# Patient Record
Sex: Male | Born: 1950 | Race: White | Hispanic: No | Marital: Married | Smoking: Never smoker
Health system: Southern US, Community
[De-identification: ages and names within clinical notes are randomized; demographics above are authoritative.]

## PROBLEM LIST (undated history)

## (undated) DIAGNOSIS — M1990 Unspecified osteoarthritis, unspecified site: Secondary | ICD-10-CM

## (undated) DIAGNOSIS — I38 Endocarditis, valve unspecified: Secondary | ICD-10-CM

## (undated) DIAGNOSIS — J45909 Unspecified asthma, uncomplicated: Secondary | ICD-10-CM

## (undated) DIAGNOSIS — L409 Psoriasis, unspecified: Secondary | ICD-10-CM

## (undated) DIAGNOSIS — F32A Depression, unspecified: Secondary | ICD-10-CM

## (undated) DIAGNOSIS — I10 Essential (primary) hypertension: Secondary | ICD-10-CM

## (undated) DIAGNOSIS — I351 Nonrheumatic aortic (valve) insufficiency: Secondary | ICD-10-CM

## (undated) DIAGNOSIS — I5189 Other ill-defined heart diseases: Secondary | ICD-10-CM

## (undated) DIAGNOSIS — E785 Hyperlipidemia, unspecified: Principal | ICD-10-CM

## (undated) DIAGNOSIS — M519 Unspecified thoracic, thoracolumbar and lumbosacral intervertebral disc disorder: Secondary | ICD-10-CM

## (undated) DIAGNOSIS — I2510 Atherosclerotic heart disease of native coronary artery without angina pectoris: Secondary | ICD-10-CM

## (undated) DIAGNOSIS — M5090 Cervical disc disorder, unspecified, unspecified cervical region: Secondary | ICD-10-CM

## (undated) DIAGNOSIS — F329 Major depressive disorder, single episode, unspecified: Secondary | ICD-10-CM

## (undated) HISTORY — DX: Major depressive disorder, single episode, unspecified: F32.9

## (undated) HISTORY — DX: Nonrheumatic aortic (valve) insufficiency: I35.1

## (undated) HISTORY — DX: Hyperlipidemia, unspecified: E78.5

## (undated) HISTORY — PX: SINOSCOPY: SHX187

## (undated) HISTORY — PX: TONSILLECTOMY: SUR1361

## (undated) HISTORY — DX: Other ill-defined heart diseases: I51.89

## (undated) HISTORY — DX: Unspecified osteoarthritis, unspecified site: M19.90

## (undated) HISTORY — DX: Cervical disc disorder, unspecified, unspecified cervical region: M50.90

## (undated) HISTORY — DX: Depression, unspecified: F32.A

## (undated) HISTORY — DX: Unspecified asthma, uncomplicated: J45.909

## (undated) HISTORY — DX: Unspecified thoracic, thoracolumbar and lumbosacral intervertebral disc disorder: M51.9

## (undated) HISTORY — DX: Psoriasis, unspecified: L40.9

## (undated) HISTORY — DX: Essential (primary) hypertension: I10

## (undated) HISTORY — PX: PROSTATE SURGERY: SHX751

## (undated) HISTORY — PX: HERNIA REPAIR: SHX51

## (undated) HISTORY — DX: Endocarditis, valve unspecified: I38

---

## 1990-04-28 HISTORY — PX: PAROTIDECTOMY: SUR1003

## 2010-04-28 HISTORY — PX: CHOLECYSTECTOMY: SHX55

## 2011-04-29 HISTORY — PX: SINUS EXPLORATION: SHX5214

## 2011-04-29 HISTORY — PX: SPINE SURGERY: SHX786

## 2011-12-26 ENCOUNTER — Other Ambulatory Visit: Payer: Self-pay | Admitting: Neurosurgery

## 2011-12-26 ENCOUNTER — Other Ambulatory Visit: Payer: Self-pay | Admitting: Gastroenterology

## 2012-01-05 ENCOUNTER — Encounter: Payer: Self-pay | Admitting: Neurosurgery

## 2012-01-05 ENCOUNTER — Ambulatory Visit: Payer: Self-pay | Admitting: Neurosurgery

## 2012-01-05 VITALS — BP 104/70 | HR 81 | Ht 72.0 in | Wt 196.0 lb

## 2012-01-05 DIAGNOSIS — M5030 Other cervical disc degeneration, unspecified cervical region: Secondary | ICD-10-CM

## 2012-01-05 DIAGNOSIS — M5020 Other cervical disc displacement, unspecified cervical region: Secondary | ICD-10-CM

## 2012-01-05 HISTORY — DX: Other cervical disc displacement, unspecified cervical region: M50.20

## 2012-01-05 HISTORY — DX: Other cervical disc degeneration, unspecified cervical region: M50.30

## 2012-01-05 NOTE — H&P (Addendum)
Dear Dr. Lily Peer                      I had the pleasure of evaluating Russell Chaney in consideration of his progressive left hand weakness, neck and left arm pain  HPI/CC: Russell Chaney is a 61 y.o. male with a 3 month history of significant neck pain. He has been to the Emergency Dept on three occasions due to severe left arm pain and hand weakness.  He has had 9 Chiropractic visits, massage therapy, taken Gabapentin, Vicodin and NSAIDs without relief.  He notes atrophy of his left hand.  Pain radiates into the Left scapula, into the lateral forearm and ends in the 4th & 5th digits.    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  He would have pain in his neck sporadically but never as severe as the past 3 months.    He has been told by the [facility] Neurologist to "live with the pain".    EMG testing on 12/31/11 identified Left" low C7, C8 and T1 polyradiculopathy". 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    PSH: no spine surgery  IMAGING: MRI July 2013 C Spine performed at [facility], Disc degeneration  With severe Left C8 nerve compression from a foraminal disc herniation.(This is not noted on the MRI report)  There is bilateral foraminal narrowing at C 6-7 worse on the R>L. Plain films reveal no abnormal movement on flexion and extension. Disc space collapse at C 5-6, C 6-7, mild straightening of lordosis.  Review of Systems: see erecord  See problem list in eRecord  Physical Exam:  Vital Signs:  BP 104/70  Pulse 81  Ht 1.829 m (6')  Wt 88.905 kg (196 lb)  BMI 26.58 kg/m2  General: Awake, alert, no acute distress  HEENT: Normal  Musculoskeletal:   Restriction of  CROM, with Left neck turning pain radiated into the palm of the hand with increased numbness into all fingers of the left hand   There was atrophy of the left palm and dorsum of the hand  Neurologic: Alert and oriented x 3.   Speech clear  CN II-XII intact  Strength 5/5 bilaterally except for SEVERE  LEFT HAND INTRINSIC WEAKNESS. Moderate left wrist flexion weakness, trace left wrist extension weakness  Sensation decreased in the left hand in all fingers, on the right in the thumb and index finger  Reflexes hypooactive and symmetrical   Finger taps very slow on the left    Conclusion:  Russell Chaney presents with an acute Left C8 radiculopathy with severe hand weakness  I will discuss his exam and imaging findings with Dr. Kennon Holter.  Dr. Kennon Holter has requested that he have a  Cervical Myelogram before a final surgical plan is reached. We will keep you apprised of our recommendations.  He will need medical Clearance for a 1 hour posterior sitting cervical laminectomy and discectomy with minimal blood loss.  ADDENDUM: The cervical myelogram was performed today and reviewed.  There is a large filling defect due to a disc herniation on the left at C7-T1, and Left C7, C8  foraminal narrowing.  Approval for surgery is requested; Left C7-T1 laminectomy and discectomy, Left C7, C8 foraminotomies. 01/07/12 140PM  Pervis Hocking, NP on 01/05/2012 at 12:21 PM

## 2012-01-05 NOTE — Patient Instructions (Signed)
I will discuss the MRI and your exam with Dr. Kennon Holter and phone you with his recommendations

## 2012-01-06 ENCOUNTER — Other Ambulatory Visit: Payer: Self-pay | Admitting: Neurosurgery

## 2012-01-06 NOTE — Addendum Note (Signed)
Addended by: Sheldon Silvan on: 01/06/2012 08:48 AM     Modules accepted: Orders, SmartSet

## 2012-01-07 ENCOUNTER — Ambulatory Visit
Admit: 2012-01-07 | Discharge: 2012-01-07 | Disposition: A | Discharge status: Home / Self Care | Payer: Self-pay | Source: Ambulatory Visit | Attending: Neurosurgery | Admitting: Neurosurgery

## 2012-01-07 ENCOUNTER — Telehealth: Payer: Self-pay | Admitting: Neurosurgery

## 2012-01-07 ENCOUNTER — Encounter: Payer: Self-pay | Admitting: Neurosurgery

## 2012-01-07 MED ORDER — OXYCODONE-ACETAMINOPHEN 5-325 MG PO TABS *I*
1.0000 | ORAL_TABLET | Freq: Once | ORAL | Status: AC
Start: 2012-01-07 — End: 2012-01-07
  Administered 2012-01-07: 1 via ORAL

## 2012-01-07 MED ORDER — MEPERIDINE HCL 75 MG/ML IJ SOLN *I*
INTRAMUSCULAR | Status: AC
Start: 2012-01-07 — End: 2012-01-07
  Filled 2012-01-07: qty 1

## 2012-01-07 MED ORDER — OXYCODONE-ACETAMINOPHEN 5-325 MG PO TABS *I*
ORAL_TABLET | ORAL | Status: AC
Start: 2012-01-07 — End: 2012-01-07
  Filled 2012-01-07: qty 1

## 2012-01-07 MED ORDER — MEPERIDINE HCL 75 MG/ML IJ SOLN *I*
75.0000 mg | Freq: Once | INTRAMUSCULAR | Status: AC
Start: 2012-01-07 — End: 2012-01-07
  Administered 2012-01-07: 75 mg via INTRAMUSCULAR

## 2012-01-07 MED ORDER — MIDAZOLAM HCL 2 MG/ML PO SYRP *I*
6.0000 mg | ORAL_SOLUTION | Freq: Once | ORAL | Status: AC
Start: 2012-01-07 — End: 2012-01-07
  Administered 2012-01-07: 6 mg via ORAL

## 2012-01-07 MED ORDER — MIDAZOLAM HCL 2 MG/ML PO SYRP *I*
ORAL_SOLUTION | ORAL | Status: AC
Start: 2012-01-07 — End: 2012-01-07
  Filled 2012-01-07: qty 5

## 2012-01-07 NOTE — Discharge Instructions (Signed)
Discharge Instructions for Myelogram:    You must leave in a wheelchair and be driven home by a responsible adult.    You have been given medicine that may make you sleepy.  Do not drive, drink alcohol, operate machinery or make major decisions in the next 24 hours.  You must be with a responsible adult for the first night after your test.    Drinks 4 (8-ounce) fluids over the first four hours after the procedure, then continue to increase your fluid intake for the remaining 20 hours.  Resuming your usual diet.    No bending, heavy lifting or straining for 72 hours.    If you take Warfarin (Coumadin), check with your primary care doctor before restarting this medicine.  Otherwise, you can resume your normal medications.    Post-procedure headaches are treated with rest and fluids.    You can reach the Radiology Charge Nurse at [phone] if:    • You developed a severe headache that becomes worse over days, severe nausea, vomiting or mental status changes.    • You developed severe pain or severe swelling at the procedure site or develop a sudden fever.    If you have any questions for the remainder of the day regarding the procedure, please call the Radiology Charge Nurse at [phone].  If you have a medical emergency, our Emergency Department is open 24 hours a day at [phone].        ____________________________________                                   __________________    Patient Signature                                                                                   Date        ____________________________________                                   __________________    RN/MD/PA                                                                                              Date

## 2012-01-07 NOTE — Procedures (Signed)
Procedure Report    CERVICAL MYELOGRAM PROCEDURE:      The patient was turned to the right lateral decubitus position and the left neck was prepared and draped in the usual sterile standard manner. Local anesthesia was effected with 1% lidocaine. Under fluoroscopic guidance the thecal sac was punctured int he posterior aspect of the C1-C2 interspace. 10cc of Omnipaque-300 iodine/mL was injected intrathecally under fluoroscopic guidance and myelographic images were obtained of the areas indicated on the requisition. The patient tolerated the procedure well and there were no complications.  Patient sent to CT in stable condition.  Full dictation to follow.    Elma Shands, PA

## 2012-01-08 NOTE — Anesthesia Pre-procedure Eval (Signed)
This is a 61 year old gentleman who is scheduled for left sitting C7-T1 laminectomy and discectomy, left C7-C8 foraminotomy on 01/12/12.  I have reviewed the patient's anesthesia RN screening information and erecord for CPM. His PMH is significant for HTN, HLD, arthritis, depression and valve disease.  Records indicate he had a stress echo 07/2010.  He exercised to 100% MPHR.  He had normal LV systolic function at rest, trace TR and mild AR.  It was stated to be low risk study with normal increase in contractility and decrease in LV end systolic volume loss with stress.  A note from 02/2011 indicated he had a cardiac cath in 2007 showing no significant disease.  No additional cardiopulmonary testing or in person anesthesia evaluation is indicated for this procedure.

## 2012-01-09 ENCOUNTER — Encounter: Payer: Self-pay | Admitting: Neurosurgery

## 2012-01-09 ENCOUNTER — Ambulatory Visit: Payer: Self-pay | Admitting: Neurosurgery

## 2012-01-09 DIAGNOSIS — M5020 Other cervical disc displacement, unspecified cervical region: Secondary | ICD-10-CM

## 2012-01-09 DIAGNOSIS — M5030 Other cervical disc degeneration, unspecified cervical region: Secondary | ICD-10-CM

## 2012-01-09 NOTE — Anesthesia Pre-procedure Eval (Addendum)
Anesthesia Pre-operative Evaluation for Russell Chaney    Summary:  Mr. Juneau is a 61 y/o male who has been experiencing neck pain and L hand weakness for 3 months.  He is scheduled for a C7-T1 laminectomy, discectomy, along with a C7-C8 foraminotomy.    Highlights of his medical history include:  HTN treated with lisinopril and metoprolol, HLD treated with atorvastatin, and depression treated with Lexapro and Wellbutrin.    Health History  Past Medical History   Diagnosis Date   . Arthritis    . Depression    . Hypertension    . Heart valve disease    . Psoriasis    . Displacement of cervical intervertebral disc without myelopathy 01/05/2012     Left C7-T1   . Degeneration of cervical intervertebral disc 01/05/2012     Past Surgical History   Procedure Laterality Date   . Hernia repair  1994   . Cholecystectomy  2012   . Sinus surgery  2013   . Bladder surgery  2008   . Prostate surgery  2002 and 2004   . Right parotidectomy Right 1992     Social History  History   Substance Use Topics   . Smoking status: Never Smoker    . Smokeless tobacco: Never Used   . Alcohol Use: No      History   Drug Use No     ______________________________________________________________________  Allergies: No Known Allergies (drug, envir, food or latex)  Prior to Admission Medications              Last Dose Start Date End Date Provider     [facility] OIL 9/11  --  --  [provider]     Multiple Vitamins-Minerals (EYE VITAMINS PO) 01/12/2012 at 0430  --  --  [provider]     albuterol (PROVENTIL, VENTOLIN, PROAIR HFA) 108 (90 BASE) MCG/ACT inhaler 01/12/2012 at 0430  --  --  [provider]     atorvastatin (LIPITOR) 40 MG tablet 01/12/2012 at 0430  --  --  [provider]     buPROPion (WELLBUTRIN XL) 150 MG 24 hr tablet 01/12/2012 at 0430  --  --  [provider]     escitalopram (LEXAPRO) 20 MG tablet 01/12/2012 at 0430  --  --  [provider]     gabapentin (NEURONTIN) 300 MG capsule  01/11/2012 at 2100  --  --  [provider]     lisinopril (PRINIVIL,ZESTRIL) 20 MG tablet 01/12/2012 at 0430  --  --  [provider]     metoprolol (LOPRESSOR) 25 MG tablet 01/12/2012 at 0430  --  --  [provider]        Current Facility-Administered Medications   Medication   . Lactated Ringers Infusion   . lidocaine 1 % injection 0.1 mL   . ceFAZolin (ANCEF) injection 2,000 mg     Admission Medications:  Scheduled Meds       . cefazolin IV     IV Meds   PRN Meds       . lidocaine 0.1 mL at 01/12/12 1610     Anesthesia EvaluationInformation Source: per records, per patient  General    + History of anesthetic complications (Many years ago a tooth was chipped under anesthesia. )  Pertinent (-):  FamHx of anesthetic complications    HEENT    + Corrective Eyewear            glasses    +  Sinus Issues (Chronic congestion)  Comment: Bilateral cataracts    Pulmonary     + Asthma            mild intermittent    + Sleep apnea          noncompliant and CPAP  Pertinent (-):  smoking Cardiovascular  Good(4+METs) Exercise Tolerance    + Hypertension            well controlled    + Cardiac Testing (LVEF of 60 %)            stress echo    + Valvular Disease (Mild AR, Trace TR)    GI/Hepatic/Renal  Last PO Intake: >8hr before procedure    Pertinent (-):  GERD, liver  issues or renal issues Neuro/Psych    + Headaches            within last week    + Psychiatric Issues          depression  Pertinent (-):  syncope or seizures    Comment: Cervical disc disease with degeneration of C7, C8, and T1    Endo/Other  Pertinent (-):  diabetes mellitus or thyroid disease     Comment:  Psoriasis    Hematologic    + Blood dyscrasia            hyperlipidemia    + Arthritis  Pertinent (-):  bruises/bleeds easily      Nursing Reported PO Status: Date Last PO Fluids: 01/12/12 0430 (sips of water with meds)  Date Last PO Solids: 01/11/12 1830  ______________________________________________________________________  Physical  Exam    Airway            Mouth opening: normal            Mallampati: II            TM distance (fb): >3 FB            Neck ROM: full  Dental        Cardiovascular           Rhythm: regular           Rate: normal     Pulmonary     breath sounds clear to auscultation    + Wheezes    Mental Status   Normal  evaluation         Most Recent Vitals: BP: 137/85 mmHg (01/12/12 0551)  Heart Rate: 64  (01/12/12 0551)  Temp: 36.6 C (97.9 F) (01/12/12 0551)  Resp: 16  (01/12/12 0551)  Height: 182.9 cm (6') (01/12/12 0551)  weight: 94.5 kg (208 lb 5.4 oz) (01/12/12 0551)  BMI (Calculated): 28.3  (01/12/12 0551)  SpO2: 94 % (01/12/12 0551)    Vital Sign Ranges (last 24hrs)  Temp:  [36.6 C (97.9 F)] 36.6 C (97.9 F)  Heart Rate:  [64] 64   Resp:  [16] 16   BP: (137)/(85) 137/85 mmHg   O2 Device: None (Room air) (01/12/12 0551)    Most Recent Lab Results   Blood Type  No results found for this basename: aborh,  abs   CBC  No results found for this basename: WBC,  WBCM,  HCT,  PLT   Chem-7  No results found for this basename: NA,  K,  WBK,  CL,  CO2,  UN,  CREAT,  GLU,  PGLU   CrCl is unknown because no creatinine reading has been taken.  Electrolytes  No results found for this basename:  CA,  MG,  PO4   Coags  No results found for this basename: PTI,  INR,  PTT   LFTs  No results found for this basename: AST,  ALT,  ALK      Pregnancy Test:   No results found for this basename: PUPT,  UPREG,  SPREG,  HCG1,  BHCG2,  BHCG,  HCGB     ECG Results  No results found for this basename: rate,  PR,  statement     ANES CPM    Radiology: No relevant studies     ________________________________________________________________________  Medical Problems  Patient Active Problem List    Diagnosis Date Noted   . Displacement of cervical intervertebral disc without myelopathy 01/05/2012     Priority: High     Left C7-T1     . Degeneration of cervical intervertebral disc 01/05/2012     Priority: Medium       PreOp/PreProcedure Diagnosis (For  more detail see procedural consent)            L hand weakness, Neck pain  Planned Procedure (For more detail see procedural consent)            C7-T1 laminectomy, discectomy, C7-C8 foraminotomy  Plan   ASA Score 2  Anesthetic Plan (general); Induction (routine IV); Airway (cuffed ETT); Line ( use current access and additional large bore IV); Monitoring (standard ASA); Positioning (beach chair); Pain (per surgical team); PostOp (PACU)    Informed Consent     Risks:          Risks discussed were commensurate with the plan listed above with the following specific points:  N/V, aspiration, sore throat , damage to:(eyes, nerves, teeth), awareness, unexpected serious injury, allergic Rx    Anesthetic Consent:         Anesthetic plan and risks discussed with:  patient and spouse    Blood products Consent:        Use of blood products discussed with: patient and spouse and they  Consented to blood products    Plan discussed with:  resident      PEC/PreOp Attestation: Anesthesia options were discussed with the patient or proxy and they understand the risks and benefits of the various anesthetic options.    Author: Romilda Garret, MD  I saw and evaluated the patient. I have reviewed and edited the resident's/fellow's note and confirm the findings and plan of care as documented above.    Kermit Balo, MD

## 2012-01-09 NOTE — Progress Notes (Signed)
Dear Dr.  Terance Ice, MD                                    I had the pleasure of re evaluating Russell Chaney in consideration of his left arm pain, numbness and weakness    Russell Chaney is a 61 y.o. male with  A 3 month history of pain in his right arm.  He has had no improvement in hand strength. Decreased pain with Gabapentin  PSH: no spine surgery    IMAGING: Cervical MRI and myelogram reviewed. Left C 7-T1 disc herniation with Left C7 compression    Review of Systems:see erecord  See problem list in eRecord    Physical Exam:  General:  Awake, alert, no acute distress.  Neck:  Supple, limited left neck turning due to stiffness and pain  No radiating pain with ROM manuevers    Neurologic:   Alert and oriented x 3   Strength 5/5 bilaterally except for severe left hand intrinsic weakness with atrophy  Sensation - left C8 hypesthesia  DTRs normoactive and symmetrical  Finger taps slow on the left    Conclusion:  Russell Chaney presents with Left C8 radiculopathy due to a disc herniation and bone spurs  I will proceed with Left C7, C8 foraminotomies, Left C7-T1 discectomy on Monday. He understands the risks and a consent was signed.  Russell Hocking, NP on 01/09/2012 at 5:27 PM

## 2012-01-12 ENCOUNTER — Encounter: Payer: Self-pay | Admitting: Neurosurgery

## 2012-01-12 ENCOUNTER — Inpatient Hospital Stay
Admit: 2012-01-12 | Disposition: A | Discharge status: Home / Self Care | Payer: Self-pay | Source: Ambulatory Visit | Attending: Neurosurgery | Admitting: Neurosurgery

## 2012-01-12 DIAGNOSIS — L409 Psoriasis, unspecified: Secondary | ICD-10-CM | POA: Diagnosis present

## 2012-01-12 DIAGNOSIS — R29898 Other symptoms and signs involving the musculoskeletal system: Secondary | ICD-10-CM | POA: Diagnosis present

## 2012-01-12 DIAGNOSIS — J45909 Unspecified asthma, uncomplicated: Secondary | ICD-10-CM | POA: Diagnosis present

## 2012-01-12 DIAGNOSIS — F32A Depression, unspecified: Secondary | ICD-10-CM | POA: Diagnosis present

## 2012-01-12 DIAGNOSIS — M1990 Unspecified osteoarthritis, unspecified site: Secondary | ICD-10-CM | POA: Diagnosis present

## 2012-01-12 HISTORY — DX: Unspecified asthma, uncomplicated: J45.909

## 2012-01-12 MED ORDER — GABAPENTIN 600 MG PO TABS
600.0000 mg | ORAL_TABLET | Freq: Every day | ORAL | Status: DC
Start: 2012-01-13 — End: 2012-01-13
  Filled 2012-01-12: qty 1

## 2012-01-12 MED ORDER — ONDANSETRON HCL 2 MG/ML IV SOLN *I*
4.0000 mg | INTRAMUSCULAR | Status: DC | PRN
Start: 2012-01-12 — End: 2012-01-13

## 2012-01-12 MED ORDER — HYDROMORPHONE HCL PF 1 MG/ML IJ SOLN *WRAPPED*
0.5000 mg | INTRAMUSCULAR | Status: DC | PRN
Start: 2012-01-12 — End: 2012-01-13

## 2012-01-12 MED ORDER — CYCLOBENZAPRINE HCL 10 MG PO TABS *I*
5.0000 mg | ORAL_TABLET | Freq: Three times a day (TID) | ORAL | Status: DC | PRN
Start: 2012-01-12 — End: 2012-01-13
  Administered 2012-01-12 – 2012-01-13 (×3): 5 mg via ORAL
  Filled 2012-01-12 (×5): qty 1

## 2012-01-12 MED ORDER — HYDROCODONE-ACETAMINOPHEN 5-325 MG PO TABS *I*
ORAL_TABLET | ORAL | Status: AC
Start: 2012-01-12 — End: 2012-01-12
  Filled 2012-01-12: qty 2

## 2012-01-12 MED ORDER — ESCITALOPRAM OXALATE 20 MG PO TABS *I*
20.0000 mg | ORAL_TABLET | Freq: Every morning | ORAL | Status: DC
Start: 2012-01-13 — End: 2012-01-13
  Administered 2012-01-13: 20 mg via ORAL
  Filled 2012-01-12: qty 1

## 2012-01-12 MED ORDER — CEFAZOLIN SODIUM 1000 MG IJ SOLR *I*
2000.0000 mg | INTRAMUSCULAR | Status: DC
Start: 2012-01-12 — End: 2012-01-12
  Administered 2012-01-12: 2000 mg via INTRAVENOUS

## 2012-01-12 MED ORDER — GABAPENTIN 800 MG PO TABS
900.0000 mg | ORAL_TABLET | Freq: Every evening | ORAL | Status: DC
Start: 2012-01-12 — End: 2012-01-13
  Administered 2012-01-12: 900 mg via ORAL
  Filled 2012-01-12 (×2): qty 1

## 2012-01-12 MED ORDER — ZOLPIDEM TARTRATE 5 MG PO TABS *I*
10.0000 mg | ORAL_TABLET | Freq: Every evening | ORAL | Status: DC | PRN
Start: 2012-01-12 — End: 2012-01-13
  Administered 2012-01-12: 10 mg via ORAL
  Filled 2012-01-12: qty 2

## 2012-01-12 MED ORDER — ONDANSETRON HCL 2 MG/ML IV SOLN *I*
1.0000 mg | Freq: Once | INTRAMUSCULAR | Status: DC | PRN
Start: 2012-01-12 — End: 2012-01-12

## 2012-01-12 MED ORDER — CEFAZOLIN IN D5W 1 GM/50ML IV SOLN *I*
1000.0000 mg | Freq: Three times a day (TID) | INTRAVENOUS | Status: AC
Start: 2012-01-12 — End: 2012-01-13
  Administered 2012-01-12 – 2012-01-13 (×2): 1000 mg via INTRAVENOUS
  Filled 2012-01-12 (×2): qty 1

## 2012-01-12 MED ORDER — HYDROMORPHONE HCL 2 MG/ML IJ SOLN *WRAPPED*
INTRAMUSCULAR | Status: AC
Start: 2012-01-12 — End: 2012-01-12
  Filled 2012-01-12: qty 1

## 2012-01-12 MED ORDER — SODIUM CHLORIDE 0.9 % IV SOLN WRAPPED *I*
75.0000 mL/h | Status: DC
Start: 2012-01-12 — End: 2012-01-13
  Administered 2012-01-12 (×2): 75 mL/h via INTRAVENOUS

## 2012-01-12 MED ORDER — LACTATED RINGERS IV SOLN *I*
20.0000 mL/h | INTRAVENOUS | Status: DC
Start: 2012-01-12 — End: 2012-01-12
  Administered 2012-01-12: 20 mL/h via INTRAVENOUS

## 2012-01-12 MED ORDER — LABETALOL HCL 20 MG/4 ML IV SOLN WRAPPED *I*
10.0000 mg | INTRAVENOUS | Status: DC | PRN
Start: 2012-01-12 — End: 2012-01-13

## 2012-01-12 MED ORDER — MULTI-VITAMINS PO TABS *I*
1.0000 | ORAL_TABLET | Freq: Every day | ORAL | Status: DC
Start: 2012-01-13 — End: 2012-01-13
  Administered 2012-01-13: 1 via ORAL
  Filled 2012-01-12 (×2): qty 1

## 2012-01-12 MED ORDER — LISINOPRIL 10 MG PO TABS *I*
10.0000 mg | ORAL_TABLET | Freq: Every morning | ORAL | Status: DC
Start: 2012-01-13 — End: 2012-01-13
  Administered 2012-01-13: 10 mg via ORAL
  Filled 2012-01-12: qty 1

## 2012-01-12 MED ORDER — HYDROCODONE-ACETAMINOPHEN 5-325 MG PO TABS *I*
1.0000 | ORAL_TABLET | ORAL | Status: DC | PRN
Start: 2012-01-12 — End: 2012-01-13

## 2012-01-12 MED ORDER — GABAPENTIN 600 MG PO TABS
600.0000 mg | ORAL_TABLET | Freq: Three times a day (TID) | ORAL | Status: DC
Start: 2012-01-12 — End: 2012-01-12
  Administered 2012-01-12: 600 mg via ORAL
  Filled 2012-01-12 (×3): qty 1

## 2012-01-12 MED ORDER — ATORVASTATIN CALCIUM 40 MG PO TABS *I*
40.0000 mg | ORAL_TABLET | Freq: Every morning | ORAL | Status: DC
Start: 2012-01-13 — End: 2012-01-13
  Administered 2012-01-13: 40 mg via ORAL
  Filled 2012-01-12: qty 1

## 2012-01-12 MED ORDER — ALBUTEROL SULFATE (2.5 MG/3ML) 0.083% IN NEBU *I*
2.5000 mg | INHALATION_SOLUTION | Freq: Four times a day (QID) | RESPIRATORY_TRACT | Status: DC | PRN
Start: 2012-01-12 — End: 2012-01-13
  Administered 2012-01-12: 2.5 mg via RESPIRATORY_TRACT

## 2012-01-12 MED ORDER — GABAPENTIN 600 MG PO TABS
600.0000 mg | ORAL_TABLET | Freq: Every day | ORAL | Status: DC
Start: 2012-01-13 — End: 2012-01-13
  Administered 2012-01-13: 600 mg via ORAL
  Filled 2012-01-12: qty 1

## 2012-01-12 MED ORDER — LIDOCAINE HCL 1 % IJ SOLN
0.1000 mL | INTRAMUSCULAR | Status: DC | PRN
Start: 2012-01-12 — End: 2012-01-12
  Administered 2012-01-12: 0.1 mL via SUBCUTANEOUS

## 2012-01-12 MED ORDER — CYCLOBENZAPRINE HCL 10 MG PO TABS *I*
ORAL_TABLET | ORAL | Status: DC
Start: 2012-01-12 — End: 2012-01-12
  Filled 2012-01-12: qty 1

## 2012-01-12 MED ORDER — ALBUTEROL SULFATE (2.5 MG/3ML) 0.083% IN NEBU *I*
INHALATION_SOLUTION | RESPIRATORY_TRACT | Status: AC
Start: 2012-01-12 — End: 2012-01-12
  Filled 2012-01-12: qty 3

## 2012-01-12 MED ORDER — BUPROPION HCL 150 MG PO TB24 *I*
150.0000 mg | ORAL_TABLET | Freq: Every morning | ORAL | Status: DC
Start: 2012-01-13 — End: 2012-01-13
  Administered 2012-01-13: 150 mg via ORAL
  Filled 2012-01-12: qty 1

## 2012-01-12 MED ORDER — MENTHOL THROAT LOZENGE *I*
1.0000 | LOZENGE | OROMUCOSAL | Status: DC | PRN
Start: 2012-01-12 — End: 2012-01-13
  Administered 2012-01-13 (×2): 1 via BUCCAL
  Filled 2012-01-12 (×8): qty 1

## 2012-01-12 MED ORDER — HYDRALAZINE HCL 20 MG/ML IJ SOLN *I*
10.0000 mg | INTRAMUSCULAR | Status: DC | PRN
Start: 2012-01-12 — End: 2012-01-13

## 2012-01-12 MED ORDER — HYDROCODONE-ACETAMINOPHEN 5-325 MG PO TABS *I*
2.0000 | ORAL_TABLET | ORAL | Status: DC | PRN
Start: 2012-01-12 — End: 2012-01-13
  Administered 2012-01-12 – 2012-01-13 (×5): 2 via ORAL
  Filled 2012-01-12 (×4): qty 2

## 2012-01-12 MED ORDER — METOPROLOL TARTRATE 12.5 MG PO CAPS *I*
12.5000 mg | ORAL_CAPSULE | Freq: Every morning | ORAL | Status: DC
Start: 2012-01-13 — End: 2012-01-13
  Administered 2012-01-13: 12.5 mg via ORAL
  Filled 2012-01-12: qty 1

## 2012-01-12 MED ORDER — HYDROMORPHONE HCL 2 MG/ML IJ SOLN *WRAPPED*
0.4000 mg | INTRAMUSCULAR | Status: AC | PRN
Start: 2012-01-12 — End: 2012-01-12
  Administered 2012-01-12 (×7): 0.4 mg via INTRAVENOUS

## 2012-01-12 NOTE — Progress Notes (Addendum)
Neurosurgery Post-op Check     Pt with stable left triceps and grip/intrinsic deficits. Fourth and fifth digit numbness resolved. Pt ambulating and voiding. Has not yet eaten.     BP: (96-137)/(62-85)   Temp:  [36.2 C (97.2 F)-36.6 C (97.9 F)]   Temp src:  [-]   Heart Rate:  [64-82]   Resp:  [12-18]   SpO2:  [93 %-99 %]   Height:  [182.9 cm (6')]   weight:  [94.5 kg (208 lb 5.4 oz)]     Exam:   AOx3  LUE: ticeps 4/5, biceps 5/5, WF/WE 5/5, grip/intrinsics 4/5   Strength otherwise 5/5 throughout   Sensation grossly intact to light touch     A/P: 61 yo male POD 0 s/p C6-C7 and C7-T1 left hemilaminectomy and foraminotomies, left C7-T1 diskectomy. Neurologically stable from pre-op.     q4h vitals and CMS checks   Floor status   SBP < 160  Continue home meds   PRN pain meds   HLIV with good PO  Regular diet  Activity as tolerated  SCDs for DVT ppx    Wandra Scot, MD

## 2012-01-12 NOTE — Progress Notes (Signed)
Pt arrived on unit from PACU at 1730. Oriented to unit and call bell. Pt ambulated from stretcher to bed with contact guard. No dizzyness, nausea or vomiting. Eating and drinking well. complaining of 6/10 neck pain. Good relief with norco and flexeril.  Not able to void since foley removed. Bladder scanned for 758. St cathed for 750. See doc flow sheets for full assessment. Will continue to monitor.  Marcie Mowers, RN

## 2012-01-12 NOTE — INTERIM OP NOTE (Signed)
Interim Op Note    Date of Surgery: 01/12/12  Surgeon: Dr. Kennon Holter  First Assistant: Dr. Jinny Sanders    Pre-Op Diagnosis: LUE weakness, C7 and C8 neural foramen stenosis     Anesthesia Type: General    Post-Op Diagnosis: Same    Additional Findings (Including unexpected complications): none    Procedure(s) Performed (including CPT 4 Code if available)  C6-C7 and C7-T1 left hemilaminectomy and foraminotomies; left C7-T1 diskectomy     Estimated Blood Loss: 120cc   Packing: No  Drains: No  Fluid Totals: Intakes: 1500cc crystalloid Outputs: None  Specimens to Pathology: no  Patient Condition: good

## 2012-01-12 NOTE — Op Note (Signed)
PATIENTTERRYION, Russell Chaney  MR #:  [id number]   ACCOUNT #:  [id number] DOB:  07-Jan-1951    AGE:  61     SURGEON:  Rickard Patience, MD  CO-SURGEON:    ASSISTANTRocky Link  SURGERY DATE:  01/12/2012    PREOPERATIVE DIAGNOSIS:  Left C8 greater than C7 radiculopathy.    POSTOPERATIVE DIAGNOSIS:  Left C8 greater than C7 radiculopathy.    OPERATIVE PROCEDURE:    1. Left C7-T1 foraminotomy/diskectomy;   2. Left C6-C7 foraminotomy.    PERTINENT HISTORY:  The patient is a 61 year old right-handed white male who had noted over a period of months, pain radiating into the left upper extremity along with intrinsic muscle wasting in the left hand and loss of dextrous hand function on the left.  He underwent the appropriate imaging studies, and this revealed a clear C8 foraminal disk herniation, along with possible foraminal stenosis at C7.  On exam he had severe weakness in a C8 distribution, along with muscle wasting, and the appropriate sensory changes, and he also had triceps weakness rated at 4-/5.     After a lengthy discussion, he elected to undergo a left C7 and left C8 foraminotomy, with possible diskectomy at the lateral level.    DESCRIPTION OF PROCEDURE:  He is admitted to [facility] through [facility] 01/12/2012.  He was brought to pre anesthesia where intravenous lines were placed.  He is brought to the operating room, where general endotracheal anesthesia was obtained.  He was placed in the semi-sitting position with Mayfield fixation, and his posterior C-spine was prepped and draped in sterile fashion.  Local anesthesia (lidocaine/Marcaine) was injected to 10 cc.     A linear incision was made from the spinous process of C6 to the spinous process of T1.  Using a subperiosteal dissection technique, the laminae of C7-T1 were identified.  The VersaTrac retractor was placed in the wound.  A film was obtained and the C5 spinous process was noted.  Excellent exposure at the level of C6-C7 and C7-T1 was achieved.     Using a pituitary  with a hole, the soft tissues were removed.  A semi-hemi laminectomy was initially started at C7-T1, and ultimately the entirety of the left C7 hemi lamina was removed.  Appropriate foraminotomies were performed at C7 and C8 foramina.  Gelfoam was used for hemostasis along with bone wax.     His C8 nerve root was retracted gently with a micro hook. A large very soft disk herniation could be identified underneath it which was contained by the PLL.  With the number 11 scalpel blade, the anulus was incised.  Large fragments of disk were easily removed.  The foramen was patent to palpation as was the C7 foramen on that side.  Hemostasis was obtained.  Bacitracin and saline solution was used for irrigation.  The soft tissues were closed in layers using 2-0 Vicryl sutures and Steri-Strips.  The patient was then aroused from anesthesia, and taken to the recovery room in satisfactory condition.  I was present for the entire procedure.    ANESTHESIA:               ______________________________  Rickard Patience, MD    WHP/MODL  DD:  01/12/2012 09:47:11  DT:  01/12/2012 11:33:35  Job #:  [id number]

## 2012-01-12 NOTE — Anesthesia Post-procedure Eval (Signed)
Anesthesia Post-op Note    Patient: Russell Chaney    Procedure(s) Performed: Left C7-T1 foraminotomy/diskectomy; Left C6-C7 foraminotomy      Anesthesia type: General    Patient location: PACU    Mental Status: Recovered to baseline    Patient able to participate in this evaluation: yes  Last Vitals: BP: 102/68 mmHg (01/12/12 1100)  BP MAP : 76 mmHg (01/12/12 1100)  Heart Rate: 78  (01/12/12 1100)  Temp: 36.5 C (97.7 F) (01/12/12 1045)  Resp: 17  (01/12/12 1100)  Height: 182.9 cm (6') (01/12/12 0551)  weight: 94.5 kg (208 lb 5.4 oz) (01/12/12 0551)  BMI (Calculated): 28.3  (01/12/12 0551)  SpO2: 98 % (01/12/12 1100)      Post-op vital signs noted above are within patient's normal range  Post-op vitals signs: stable  Respiratory function: baseline    Airway patent: Yes    Cardiovascular and hydration status stable: Yes    Post-Op pain: Adequate analgesia    Post-Op nausea and vomiting: none    Post-Op assessment: no apparent anesthetic complications, tolerated procedure well and no evidence of recall    Complications: none    Attending Attestation: All indicated post anesthesia care provided    Author: Kermit Balo, MD  as of: 01/12/2012  at: 12:01 PM

## 2012-01-12 NOTE — Progress Notes (Signed)
UPDATES TO PATIENT'S CONDITION on the DAY OF SURGERY/PROCEDURE    I. Updates to Patient's Condition (to be completed by a provider privileged to complete a H&P, following reassessment of the patient by the provider):    Day of Surgery/Procedure Update:  History  History reviewed and no change    Physical  Physical exam updated and no change              II. Procedure Readiness   I have reviewed the patient's H&P and updated condition. By completing and signing this form, I attest that this patient is ready for surgery/procedure.      III. Attestation   I have reviewed the updated information regarding the patient's condition and it is appropriate to proceed with the planned surgery/procedure.    Lovina Reach, MD as of 7:27 AM 01/12/2012

## 2012-01-12 NOTE — Discharge Summary (Signed)
Discharge Summary       Admit date: 01/12/2012         Discharge date and time: 01/13/12  Admitting Physician: Lovina Reach, MD   Discharge Attending: Lovina Reach  Patient: Russell Chaney Age: 60 y.o. Date of Birth: 1950/12/18 [id number]    Chief Complaint: Left arm pain and weakness  Admission Diagnosis: Left C7-T1 disc herniation with cervical spondylosis  Details of Admission: as per admission H&P  Discharge Diagnoses:same  Active Hospital Problems    Diagnosis   . Displacement of cervical intervertebral disc without myelopathy     Left C7-T1     . Arthritis   . Depression   . Psoriasis   . Asthma      Resolved Hospital Problems    Diagnosis   No resolved problems to display.     Hospital Course (including key diagnostic test results): Admitted for elective Left C7-T1 laminectomy and discectomy with left C7, C8 foraminotomies.    Large soft disc herniation removed compressing the Left C8 nerve root. C7 foraminal stenosis resolved with a foraminotomy. No operative complications.   Urinating without difficulty.  Afebrile.  Pain controlled.  Dressing intact.    Key Exam Findings at Discharge: persistent left hand intrinsic weakness, Left C8 hypesthesia    Admission Weight: weight: 94.5 kg (208 lb 5.4 oz)  Discharge Weight: weight: 94.5 kg (208 lb 5.4 oz)     Discharged Condition: Stable  Discharge medications, instructions, and follow-up plans: as per After Visit Summary  Disposition: Home with Family, instructions reviewed.    Signed: Pervis Hocking, NP  On: 01/12/2012  at: 12:25 PM

## 2012-01-13 MED ORDER — HYDROCODONE-ACETAMINOPHEN 5-325 MG PO TABS *I*
2.0000 | ORAL_TABLET | Freq: Four times a day (QID) | ORAL | Status: AC | PRN
Start: 2012-01-13 — End: 2012-02-12

## 2012-01-13 MED ORDER — CYCLOBENZAPRINE HCL 5 MG PO TABS *I*
5.0000 mg | ORAL_TABLET | Freq: Three times a day (TID) | ORAL | Status: AC | PRN
Start: 2012-01-13 — End: 2012-02-12

## 2012-01-13 MED FILL — Bupivacaine Inj 0.5% w/ Epinephrine 1:200000: INTRAMUSCULAR | Qty: 50 | Status: AC

## 2012-01-13 MED FILL — Midazolam HCl Inj 2 MG/2ML (Base Equivalent): INTRAMUSCULAR | Qty: 2 | Status: AC

## 2012-01-13 MED FILL — Fentanyl Citrate Inj 0.05 MG/ML: INTRAMUSCULAR | Qty: 5 | Status: AC

## 2012-01-13 NOTE — Progress Notes (Signed)
Neurosurgery Progress Note    Pt with stable left triceps and grip/intrinsic deficits. Pt ambulating, voiding, and tolerating a regular diet.     BP: (95-136)/(54-72)   Temp:  [36 C (96.8 F)-36.9 C (98.4 F)]   Temp src:  [-]   Heart Rate:  [74-92]   Resp:  [12-18]   SpO2:  [93 %-99 %]   Height:  [180.3 cm (5\' 11" )]   weight:  [94.348 kg (208 lb)]     Exam:   AOx3  LUE: ticeps 4/5, biceps 5/5, WF/WE 5/5, grip/intrinsics 4/5   Strength otherwise 5/5 throughout   Sensation grossly intact to light touch     A/P: 61 yo male POD 1 s/p C6-C7 and C7-T1 left hemilaminectomy and foraminotomies, left C7-T1 diskectomy. Neurologically stable from pre-op.     q4h vitals and CMS checks   Floor status   Continue home meds   PRN pain meds   HLIV  Regular diet  Activity as tolerated  SCDs for DVT ppx  Anticipate discharge today    Wandra Scot, MD

## 2012-01-13 NOTE — Progress Notes (Addendum)
Utilization Management    Level of care inpatient as of 01/12/2012.      Brigitte Pulse Obi Scrima, Charity fundraiser     Pager: [phone]

## 2012-01-13 NOTE — Discharge Instructions (Signed)
DIET:  Regular    ACTIVITY:     Walk every 2 hours during the day.  By the end of the first month you should be walking at least 1 1/2 miles a day.    10 lb lifting limit the first month.    No driving for the first week.  Then only if you are off pain medication and you can turn your neck normally.    WOUND CARE:    The dressings should be removed on the second day after surgery and the incisions should be left open to air.  Do not use creams, lotions, or ointments on the incision.      The incision should be kept dry for 5 days after surgery.   If you have steri strips, remove them by the 8th day following surgery.      OTHER:      You should drink plenty of fluids and use a stool softener or laxative to ensure regular bowel movements.  These are available over the counter at your pharmacy.  Do not go more than 2 days without using one of the medications to avoid significant constipation when on narcotic pain medication.    WHEN TO CALL OUR OFFICE [phone])):      Redness, worsening pain, or swelling at or near the incision site    Drainage from the incision    Fever greater than 101 degrees    Irritability or extreme sleepiness    Nausea and vomiting    FOLLOW UP APPOINTMENT IN ONE MONTH AFTER SURGERY Oct 11th at 430PM with Peyton Bottoms NP  [address] [place], [place]

## 2012-01-15 ENCOUNTER — Other Ambulatory Visit: Payer: Self-pay | Admitting: Gastroenterology

## 2012-01-15 ENCOUNTER — Telehealth: Payer: Self-pay | Admitting: Neurosurgery

## 2012-01-15 ENCOUNTER — Observation Stay
Admission: EM | Admit: 2012-01-15 | Disposition: A | Discharge status: Home / Self Care | Payer: Self-pay | Source: Ambulatory Visit | Attending: Emergency Medicine | Admitting: Emergency Medicine

## 2012-01-15 ENCOUNTER — Encounter: Payer: Self-pay | Admitting: Emergency Medicine

## 2012-01-15 LAB — BASIC METABOLIC PANEL
Anion Gap: 9 (ref 7–16)
CO2: 28 mmol/L (ref 20–28)
Calcium: 9.3 mg/dL (ref 8.6–10.2)
Chloride: 104 mmol/L (ref 96–108)
Creatinine: 1.23 mg/dL — ABNORMAL HIGH (ref 0.67–1.17)
GFR,Black: 73 *
GFR,Caucasian: 63 *
Glucose: 152 mg/dL — ABNORMAL HIGH (ref 60–99)
Lab: 17 mg/dL (ref 6–20)
Potassium: 4.2 mmol/L (ref 3.3–5.1)
Sodium: 141 mmol/L (ref 133–145)

## 2012-01-15 LAB — CBC AND DIFFERENTIAL
Baso # K/uL: 0 10*3/uL (ref 0.0–0.1)
Basophil %: 0.4 % (ref 0.2–1.2)
Eos # K/uL: 0.6 10*3/uL — ABNORMAL HIGH (ref 0.0–0.5)
Eosinophil %: 8.1 % — ABNORMAL HIGH (ref 0.8–7.0)
Hematocrit: 37 % — ABNORMAL LOW (ref 40–51)
Hemoglobin: 12.7 g/dL — ABNORMAL LOW (ref 13.7–17.5)
Lymph # K/uL: 1.6 10*3/uL (ref 1.3–3.6)
Lymphocyte %: 22 % (ref 21.8–53.1)
MCV: 90 fL (ref 79–92)
Mono # K/uL: 0.8 10*3/uL (ref 0.3–0.8)
Monocyte %: 11.4 % (ref 5.3–12.2)
Neut # K/uL: 4.1 10*3/uL (ref 1.8–5.4)
Platelets: 126 10*3/uL — ABNORMAL LOW (ref 150–330)
RBC: 4.2 MIL/uL — ABNORMAL LOW (ref 4.6–6.1)
RDW: 13.2 % (ref 11.6–14.4)
Seg Neut %: 58.1 % (ref 34.0–67.9)
WBC: 7 10*3/uL (ref 4.2–9.1)

## 2012-01-15 LAB — HOLD GREEN WITH GEL

## 2012-01-15 LAB — RUQ PANEL (ED ONLY)
ALT: 57 U/L — ABNORMAL HIGH (ref 0–50)
AST: 44 U/L (ref 0–50)
Albumin: 4.2 g/dL (ref 3.5–5.2)
Alk Phos: 69 U/L (ref 40–130)
Amylase: 49 U/L (ref 28–100)
Bilirubin,Direct: 0.2 mg/dL (ref 0.0–0.3)
Bilirubin,Total: 0.7 mg/dL (ref 0.0–1.2)
Lipase: 24 U/L (ref 13–60)
Total Protein: 6.1 g/dL — ABNORMAL LOW (ref 6.3–7.7)

## 2012-01-15 LAB — HOLD BLUE

## 2012-01-15 MED ORDER — ONDANSETRON HCL 2 MG/ML IV SOLN *I*
4.0000 mg | Freq: Four times a day (QID) | INTRAMUSCULAR | Status: DC | PRN
Start: 2012-01-15 — End: 2012-01-17

## 2012-01-15 MED ORDER — HYDROMORPHONE HCL 2 MG/ML IJ SOLN *WRAPPED*
1.0000 mg | INTRAMUSCULAR | Status: DC | PRN
Start: 2012-01-15 — End: 2012-01-16
  Administered 2012-01-15: 1 mg via INTRAVENOUS
  Filled 2012-01-15: qty 1

## 2012-01-15 MED ORDER — METOPROLOL TARTRATE 12.5 MG PO CAPS *I*
12.5000 mg | ORAL_CAPSULE | Freq: Every morning | ORAL | Status: DC
Start: 2012-01-16 — End: 2012-01-17
  Administered 2012-01-16 – 2012-01-17 (×2): 12.5 mg via ORAL
  Filled 2012-01-15 (×2): qty 1

## 2012-01-15 MED ORDER — MORPHINE SULFATE 4 MG/ML IJ SOLN
4.0000 mg | INTRAMUSCULAR | Status: DC | PRN
Start: 2012-01-15 — End: 2012-01-15
  Administered 2012-01-15: 4 mg via INTRAVENOUS
  Filled 2012-01-15: qty 1

## 2012-01-15 MED ORDER — PREDNISONE 20 MG PO TABS *I*
50.0000 mg | ORAL_TABLET | Freq: Once | ORAL | Status: AC
Start: 2012-01-15 — End: 2012-01-15
  Administered 2012-01-15: 50 mg via ORAL
  Filled 2012-01-15: qty 3

## 2012-01-15 MED ORDER — AZITHROMYCIN 250 MG PO TABS *I*
500.0000 mg | ORAL_TABLET | Freq: Once | ORAL | Status: AC
Start: 2012-01-15 — End: 2012-01-15
  Administered 2012-01-15: 500 mg via ORAL
  Filled 2012-01-15: qty 2

## 2012-01-15 MED ORDER — HYDROCODONE-ACETAMINOPHEN 5-325 MG PO TABS *I*
1.0000 | ORAL_TABLET | Freq: Once | ORAL | Status: AC
Start: 2012-01-15 — End: 2012-01-16
  Filled 2012-01-15: qty 1

## 2012-01-15 MED ORDER — ACETAMINOPHEN-CODEINE 120-12 MG/5ML PO SOLN *I*
5.0000 mL | ORAL | Status: DC | PRN
Start: 2012-01-15 — End: 2012-01-17
  Administered 2012-01-15 – 2012-01-16 (×4): 5 mL via ORAL
  Filled 2012-01-15 (×4): qty 12.5

## 2012-01-15 MED ORDER — BUPROPION HCL 150 MG PO TB24 *I*
150.0000 mg | ORAL_TABLET | Freq: Every morning | ORAL | Status: DC
Start: 2012-01-16 — End: 2012-01-17
  Administered 2012-01-16 – 2012-01-17 (×2): 150 mg via ORAL
  Filled 2012-01-15 (×2): qty 1

## 2012-01-15 MED ORDER — LISINOPRIL 10 MG PO TABS *I*
10.0000 mg | ORAL_TABLET | Freq: Every morning | ORAL | Status: DC
Start: 2012-01-16 — End: 2012-01-17
  Administered 2012-01-16 – 2012-01-17 (×2): 10 mg via ORAL
  Filled 2012-01-15 (×2): qty 1

## 2012-01-15 MED ORDER — BENZONATATE 100 MG PO CAPS *I*
100.0000 mg | ORAL_CAPSULE | Freq: Three times a day (TID) | ORAL | Status: DC | PRN
Start: 2012-01-15 — End: 2012-01-17
  Administered 2012-01-15 – 2012-01-16 (×2): 100 mg via ORAL
  Filled 2012-01-15 (×2): qty 1

## 2012-01-15 MED ORDER — GABAPENTIN 300 MG PO CAPS
900.0000 mg | ORAL_CAPSULE | Freq: Once | ORAL | Status: AC
Start: 2012-01-15 — End: 2012-01-15
  Administered 2012-01-15: 900 mg via ORAL
  Filled 2012-01-15: qty 3

## 2012-01-15 MED ORDER — ESCITALOPRAM OXALATE 20 MG PO TABS *I*
20.0000 mg | ORAL_TABLET | Freq: Every morning | ORAL | Status: DC
Start: 2012-01-16 — End: 2012-01-17
  Administered 2012-01-16 – 2012-01-17 (×2): 20 mg via ORAL
  Filled 2012-01-15 (×2): qty 1

## 2012-01-15 MED ORDER — ATORVASTATIN CALCIUM 40 MG PO TABS *I*
40.0000 mg | ORAL_TABLET | Freq: Every morning | ORAL | Status: DC
Start: 2012-01-16 — End: 2012-01-17
  Administered 2012-01-16 – 2012-01-17 (×2): 40 mg via ORAL
  Filled 2012-01-15 (×2): qty 1

## 2012-01-15 MED ORDER — IPRATROPIUM BROMIDE 0.02 % IN SOLN *I*
500.0000 ug | Freq: Once | RESPIRATORY_TRACT | Status: AC
Start: 2012-01-15 — End: 2012-01-15
  Administered 2012-01-15: 500 ug via RESPIRATORY_TRACT
  Filled 2012-01-15: qty 2.5

## 2012-01-15 MED ORDER — HYDROCODONE-ACETAMINOPHEN 5-325 MG PO TABS *I*
2.0000 | ORAL_TABLET | ORAL | Status: DC | PRN
Start: 2012-01-15 — End: 2012-01-17
  Administered 2012-01-16 (×3): 2 via ORAL
  Filled 2012-01-15 (×3): qty 2

## 2012-01-15 MED ORDER — ALBUTEROL SULFATE (2.5 MG/3ML) 0.083% IN NEBU *I*
2.5000 mg | INHALATION_SOLUTION | RESPIRATORY_TRACT | Status: DC | PRN
Start: 2012-01-15 — End: 2012-01-17
  Administered 2012-01-15 – 2012-01-16 (×2): 2.5 mg via RESPIRATORY_TRACT
  Filled 2012-01-15 (×3): qty 3

## 2012-01-15 MED ORDER — ZOLPIDEM TARTRATE 5 MG PO TABS *I*
5.0000 mg | ORAL_TABLET | Freq: Once | ORAL | Status: AC
Start: 2012-01-15 — End: 2012-01-16

## 2012-01-15 MED ORDER — CYCLOBENZAPRINE HCL 10 MG PO TABS *I*
5.0000 mg | ORAL_TABLET | Freq: Three times a day (TID) | ORAL | Status: DC | PRN
Start: 2012-01-15 — End: 2012-01-17
  Administered 2012-01-16: 5 mg via ORAL
  Filled 2012-01-15: qty 1

## 2012-01-15 MED ORDER — GABAPENTIN 300 MG PO CAPS
600.0000 mg | ORAL_CAPSULE | Freq: Two times a day (BID) | ORAL | Status: DC
Start: 2012-01-16 — End: 2012-01-17
  Administered 2012-01-16 – 2012-01-17 (×3): 600 mg via ORAL
  Filled 2012-01-15 (×3): qty 2
  Filled 2012-01-15: qty 3

## 2012-01-15 MED ORDER — PREDNISONE 20 MG PO TABS *I*
60.0000 mg | ORAL_TABLET | Freq: Every day | ORAL | Status: DC
Start: 2012-01-16 — End: 2012-01-17
  Administered 2012-01-16 – 2012-01-17 (×2): 60 mg via ORAL
  Filled 2012-01-15 (×2): qty 3

## 2012-01-15 MED ORDER — SODIUM CHLORIDE 0.9 % IN NEBU *I*
3.0000 mL | INHALATION_SOLUTION | RESPIRATORY_TRACT | Status: DC | PRN
Start: 2012-01-15 — End: 2012-01-17

## 2012-01-15 MED ORDER — ALBUTEROL SULFATE (2.5 MG/3ML) 0.083% IN NEBU *I*
2.5000 mg | INHALATION_SOLUTION | Freq: Once | RESPIRATORY_TRACT | Status: AC
Start: 2012-01-15 — End: 2012-01-15
  Administered 2012-01-15: 2.5 mg via RESPIRATORY_TRACT
  Filled 2012-01-15: qty 3

## 2012-01-15 MED ORDER — KETOROLAC TROMETHAMINE 30 MG/ML IJ SOLN *I*
30.0000 mg | Freq: Four times a day (QID) | INTRAMUSCULAR | Status: DC | PRN
Start: 2012-01-15 — End: 2012-01-17

## 2012-01-15 MED ORDER — SODIUM CHLORIDE 0.9 % IV BOLUS *I*
1000.0000 mL | Freq: Once | Status: AC
Start: 2012-01-15 — End: 2012-01-15
  Administered 2012-01-15 (×2): 1000 mL via INTRAVENOUS

## 2012-01-15 NOTE — ED Provider Progress Notes (Signed)
ED Provider Progress Note   No PE, now wheezing and coughing up yellow sputum, Probable asthma/bronchitis - no pneumonia on CXR - A&A nebs, azithromycin,  po steroids - cleared by neurosurgery consult, morphine for pain, admit to obs.         Pauline Good, MD, 01/15/2012, 8:01 PM

## 2012-01-15 NOTE — ED Notes (Signed)
Referred by Dr. Harrington Challenger for tx. With c/o sob , to r/oPE.

## 2012-01-15 NOTE — Progress Notes (Signed)
Utilization Management    Level of Care Observation service as of the date 01/15/2012      Ilyaas Musto N Emmalena Canny, RN     Pager: [phone]

## 2012-01-15 NOTE — ED Notes (Signed)
Patient reports recent laminectomy surgery, discharged to home on Tuesday. Has been having increased SOB since then. Patient reports a hx of asthma and states his inhaler has not been helping. Patient also c/o headache, upper back pain, and chest discomfort with deep inspiration. Denies fever, chills, n/v/d. Will monitor and treat per orders.

## 2012-01-15 NOTE — ED Provider Notes (Signed)
History     Chief Complaint   Patient presents with   . Shortness of Breath   . Chest Pain     The history is provided by the patient and the spouse.   Patient s/p cervical laminectomy 9/16 - here with difficulty breathing which started yesterday. He initially thought it might be asthma and tried his inhaler 3 puffs every few hours without relief. He has a cough, no fever, no leg swelling. No history of DVT or PE. He denies chest pain.    Past Medical History   Diagnosis Date   . Arthritis    . Depression    . Hypertension    . Heart valve disease    . Psoriasis    . Displacement of cervical intervertebral disc without myelopathy 01/05/2012     Left C7-T1   . Degeneration of cervical intervertebral disc 01/05/2012   . Asthma             Past Surgical History   Procedure Laterality Date   . Hernia repair  1994   . Cholecystectomy  2012   . Sinus surgery  2013   . Bladder surgery  2008   . Prostate surgery  2002 and 2004   . Right parotidectomy Right 1992       Family History   Problem Relation Age of Onset   . Cancer Mother      Colon cancer in remission.   . Cancer Father      Lung and throat.         Social History      reports that he has never smoked. He has never used smokeless tobacco. He reports that he currently engages in sexual activity. He reports that he does not drink alcohol or use illicit drugs.    Living Situation    Questions Responses    Patient lives with     Homeless     Caregiver for other family member     External Services     Employment     Domestic Violence Risk           Review of Systems   Review of Systems   Constitutional: Negative for fever and chills.   HENT: Positive for postnasal drip. Negative for congestion.    Eyes: Negative for visual disturbance.   Respiratory: Positive for cough and shortness of breath. Negative for wheezing.    Cardiovascular: Negative for chest pain, palpitations and leg swelling.   Gastrointestinal: Negative for nausea and abdominal pain.   Musculoskeletal:         Neck pain   Allergic/Immunologic: Negative for immunocompromised state.   Neurological: Negative for dizziness, speech difficulty and light-headedness.   Hematological: Negative for adenopathy.   Psychiatric/Behavioral: Negative for confusion.       Physical Exam     ED Triage Vitals   BP Heart Rate Heart Rate(via Pulse Ox) Resp Temp Temp src SpO2 O2 Device O2 Flow Rate   01/15/12 1531 01/15/12 1531 -- 01/15/12 1531 01/15/12 1531 -- 01/15/12 1531 01/15/12 1531 --   115/78 mmHg 72   16  36.4 C (97.5 F)  96 % None (Room air)       weight           01/15/12 1531           91.627 kg (202 lb)               Physical Exam   Vitals reviewed.  Constitutional: He is oriented to person, place, and time. He appears well-developed and well-nourished.   HENT:   Head: Normocephalic.   Mouth/Throat: Oropharynx is clear and moist.   Incision posterior neck - no redness or drainage.   Cardiovascular: Normal rate and normal heart sounds.    Pulmonary/Chest: No stridor. He is in respiratory distress. He has no wheezes. He has no rales. He exhibits no tenderness.   Abdominal: Soft. There is no tenderness. There is no rebound and no guarding.   Musculoskeletal: He exhibits no edema and no tenderness.   Neurological: He is alert and oriented to person, place, and time.   Skin: Skin is warm and dry.   Psychiatric: He has a normal mood and affect.       Medical Decision Making      Amount and/or Complexity of Data Reviewed  Clinical lab tests: ordered and reviewed  Tests in the radiology section of CPT: ordered and reviewed        Initial Evaluation:  ED First Provider Contact    Date/Time Event User Comments    01/15/12 1618 ED Provider First Contact [facility], Karion Cudd L Initial Face to Face Provider Contact          Patient seen by me on arrival date of 01/15/2012 at 1618    Assessment:  61 y.o., male comes to the ED with shortness of breath.     Differential Diagnosis includes PE, pneumonia (not evident on CXR) doubt RAD              Plan: CXR, [facility] CT, labs and reassess.      Pauline Good, MD    Pauline Good, MD  01/15/12 [phone]

## 2012-01-15 NOTE — ED Obs Notes (Signed)
ED OBSERVATION ADMISSION NOTE    Patient seen by me today, 01/15/2012 at 9:09 PM    Current patient status: Observation    History     Chief Complaint   Patient presents with   . Shortness of Breath   . Chest Pain     HPI Comments: Pt is a 61M with hx of chronic back pain, recent cervical laminectomy, HTN, HLD and asthma, placed in OBS following eval in the ED for cough and SOB. Pt notes symptoms began yesterday, he tried his albuterol inhaler with mild relief, but notes increased DOE and SOB refractory to his inhaler use. He recently had a cervical laminectomy and states his neck pain is quite worsened by the coughing. He feels he has a thick productive cough since surgery but due to pain has had a difficult time coughing it up. Denies fevers. No CP.    The history is provided by the patient.       Past Medical History   Diagnosis Date   . Arthritis    . Depression    . Hypertension    . Heart valve disease    . Psoriasis    . Displacement of cervical intervertebral disc without myelopathy 01/05/2012     Left C7-T1   . Degeneration of cervical intervertebral disc 01/05/2012   . Asthma        Past Surgical History   Procedure Laterality Date   . Hernia repair  1994   . Cholecystectomy  2012   . Sinus surgery  2013   . Bladder surgery  2008   . Prostate surgery  2002 and 2004   . Right parotidectomy Right 1992       Family History   Problem Relation Age of Onset   . Cancer Mother      Colon cancer in remission.   . Cancer Father      Lung and throat.       Social History      reports that he has never smoked. He has never used smokeless tobacco. He reports that he currently engages in sexual activity. He reports that he does not drink alcohol or use illicit drugs.    Living Situation    Questions Responses    Patient lives with     Homeless     Caregiver for other family member     External Services     Employment     Domestic Violence Risk           Review of Systems   Review of Systems   Constitutional: Negative for  fever, chills, diaphoresis, appetite change and fatigue.   HENT: Positive for congestion and neck pain. Negative for sore throat, sneezing, trouble swallowing, neck stiffness and sinus pressure.    Eyes: Negative for photophobia and visual disturbance.   Respiratory: Positive for cough, shortness of breath and wheezing. Negative for chest tightness.    Cardiovascular: Negative for chest pain.   Gastrointestinal: Negative for nausea, vomiting, abdominal pain and diarrhea.   Genitourinary: Negative for dysuria, urgency and frequency.   Musculoskeletal: Negative for back pain and gait problem.   Skin: Negative for color change and pallor.   Neurological: Positive for headaches. Negative for dizziness, syncope, weakness and numbness.   Hematological: Negative.    Psychiatric/Behavioral: Negative.        Physical Exam   BP 148/89  Pulse 88  Temp(Src) 37 C (98.6 F) (Temporal)  Resp 16  Ht 1.803  m (5' 10.98")  Wt 91.627 kg (202 lb)  BMI 28.19 kg/m2  SpO2 98%    Physical Exam   Constitutional: He is oriented to person, place, and time. He appears well-developed and well-nourished. He appears ill. No distress.   HENT:   Head: Normocephalic and atraumatic.   Mouth/Throat: Oropharynx is clear and moist.   Eyes: Conjunctivae are normal.   Neck: Neck supple.   Pulmonary/Chest: No accessory muscle usage. Not tachypneic. No respiratory distress. He has wheezes in the right upper field, the right middle field, the left upper field and the left middle field. He has rhonchi in the right middle field and the right lower field. He has no rales.   Abdominal: Soft. Bowel sounds are normal. He exhibits no distension. There is no tenderness.   Musculoskeletal: Normal range of motion. He exhibits no edema.   Neurological: He is alert and oriented to person, place, and time.   Skin: He is diaphoretic.   Psychiatric: He has a normal mood and affect. His behavior is normal.       Tests   Labs:   All labs in the last 24 hours   Recent  Results (from the past 24 hour(s))   HOLD GREEN WITH GEL       Result Value Range    Hold Green (w/gel,spun) HOLD TUBE     HOLD BLUE       Result Value Range    Hold Blue HOLD TUBE     CBC AND DIFFERENTIAL    Collection Time    01/15/12  4:55 PM       Result Value Range    WBC 7.0  4.2 - 9.1 THOU/uL    RBC 4.2 (*) 4.6 - 6.1 MIL/uL    Hemoglobin 12.7 (*) 13.7 - 17.5 g/dL    Hematocrit 37 (*) 40 - 51 %    MCV 90  79 - 92 fL    RDW 13.2  11.6 - 14.4 %    Platelets 126 (*) 150 - 330 THOU/uL    Seg Neut % 58.1  34.0 - 67.9 %    Lymphocyte % 22.0  21.8 - 53.1 %    Monocyte % 11.4  5.3 - 12.2 %    Eosinophil % 8.1 (*) 0.8 - 7.0 %    Basophil % 0.4  0.2 - 1.2 %    Neut # K/uL 4.1  1.8 - 5.4 THOU/uL    Lymph # K/uL 1.6  1.3 - 3.6 THOU/uL    Mono # K/uL 0.8  0.3 - 0.8 THOU/uL    Eos # K/uL 0.6 (*) 0.0 - 0.5 THOU/uL    Baso # K/uL 0.0  0.0 - 0.1 THOU/uL   BASIC METABOLIC PANEL    Collection Time    01/15/12  4:55 PM       Result Value Range    Glucose 152 (*) 60 - 99 mg/dL    Sodium 409  811 - 914 mmol/L    Potassium 4.2  3.3 - 5.1 mmol/L    Chloride 104  96 - 108 mmol/L    CO2 28  20 - 28 mmol/L    Anion Gap 9  7 - 16    UN 17  6 - 20 mg/dL    Creatinine 7.82 (*) 0.67 - 1.17 mg/dL    GFR,Caucasian 63      GFR,Black 73      Calcium 9.3  8.6 - 10.2 mg/dL  RUQ PANEL    Collection Time    01/15/12  4:55 PM       Result Value Range    Amylase 49  28 - 100 U/L    Lipase 24  13 - 60 U/L    Total Protein 6.1 (*) 6.3 - 7.7 g/dL    Albumin 4.2  3.5 - 5.2 g/dL    Bilirubin,Total 0.7  0.0 - 1.2 mg/dL    Bilirubin,Direct 0.2  0.0 - 0.3 mg/dL    Alk Phos 69  40 - 161 U/L    AST 44  0 - 50 U/L    ALT 57 (*) 0 - 50 U/L        Imaging: *chest Standard Frontal And Lateral Views    01/15/2012  IMPRESSION:   No acute cardiopulmonary disease.   END REPORT         Medical Decision Making   <EDMDM>    Assessment:  61 y.o., male placed in OBS after evaluation in the ED for  SOB and cough. Pt with recent laminectomy, concern for PE given recnet surgery,  however CTA of chest is negative.     Differential Diagnosis includes asthmatic bronchitis, Asthma exacerbation, PNA, PE (ruled out), post-intubation irritation, URI.     Plan:   1. SOB, Cough: Nebs, steroids, azithromycin, cough suppression. Peak flow and daily amb 02 sat. Daily CBC and BMP.   2. S/p laminectomy: continue current pain regimen (flexeril and vicodin), may require IV PRN for breakthrough given coughing causes more pain. Encourage ambulation, turn cough Deep breath.   3. HTN: continue lisinopril, metoprolol   4. HLD: continue statin.     Supervising physician Dr Alisa Graff was immediately available.    Medically preferred DVT prophylaxis: LMWH      Ronn Smolinsky G Jovian Lembcke, NP

## 2012-01-15 NOTE — Telephone Encounter (Signed)
[name] is on hydrocodone 325/5 which is not effective in relieving his pain. Preop he was on 325/10. He would like a stronger script. Also he is having trouble wheezing and coughing. He is all out of Albuterol. His PCP's office is closed today. He was wondering if you could prescribe albuterol today. I suggested he contact his PCP's office number for emergencies and when they respond, ask for the script. Not sure if you would prescribe his albuterol.    His drug store is [facility]:  [phone]

## 2012-01-15 NOTE — ED Notes (Signed)
Plan of Care   Pain control, cough deep breath, ambulation,  continue ordered medications.

## 2012-01-16 ENCOUNTER — Other Ambulatory Visit: Payer: Self-pay | Admitting: Neurosurgery

## 2012-01-16 LAB — CBC AND DIFFERENTIAL
Baso # K/uL: 0 10*3/uL (ref 0.0–0.1)
Basophil %: 0.2 % (ref 0.2–1.2)
Eos # K/uL: 0.1 10*3/uL (ref 0.0–0.5)
Eosinophil %: 1.1 % (ref 0.8–7.0)
Hematocrit: 37 % — ABNORMAL LOW (ref 40–51)
Hemoglobin: 12.7 g/dL — ABNORMAL LOW (ref 13.7–17.5)
Lymph # K/uL: 0.9 10*3/uL — ABNORMAL LOW (ref 1.3–3.6)
Lymphocyte %: 13.6 % — ABNORMAL LOW (ref 21.8–53.1)
MCV: 88 fL (ref 79–92)
Mono # K/uL: 0.2 10*3/uL — ABNORMAL LOW (ref 0.3–0.8)
Monocyte %: 2.3 % — ABNORMAL LOW (ref 5.3–12.2)
Neut # K/uL: 5.3 10*3/uL (ref 1.8–5.4)
Platelets: 144 10*3/uL — ABNORMAL LOW (ref 150–330)
RBC: 4.2 MIL/uL — ABNORMAL LOW (ref 4.6–6.1)
RDW: 12.9 % (ref 11.6–14.4)
Seg Neut %: 82.8 % — ABNORMAL HIGH (ref 34.0–67.9)
WBC: 6.5 10*3/uL (ref 4.2–9.1)

## 2012-01-16 LAB — BASIC METABOLIC PANEL
Anion Gap: 10 (ref 7–16)
CO2: 26 mmol/L (ref 20–28)
Calcium: 9.2 mg/dL (ref 8.6–10.2)
Chloride: 103 mmol/L (ref 96–108)
Creatinine: 1.05 mg/dL (ref 0.67–1.17)
GFR,Black: 88 *
GFR,Caucasian: 76 *
Glucose: 227 mg/dL — ABNORMAL HIGH (ref 60–99)
Lab: 16 mg/dL (ref 6–20)
Potassium: 4.5 mmol/L (ref 3.3–5.1)
Sodium: 139 mmol/L (ref 133–145)

## 2012-01-16 MED ORDER — ALBUTEROL SULFATE HFA 108 (90 BASE) MCG/ACT IN AERS *I*
1.0000 | INHALATION_SPRAY | RESPIRATORY_TRACT | Status: AC | PRN
Start: 2012-01-16 — End: ?

## 2012-01-16 MED ORDER — AZITHROMYCIN 250 MG PO TABS *I*
500.0000 mg | ORAL_TABLET | Freq: Every day | ORAL | Status: DC
Start: 2012-01-16 — End: 2012-01-17
  Administered 2012-01-16 – 2012-01-17 (×2): 500 mg via ORAL
  Filled 2012-01-16 (×2): qty 2

## 2012-01-16 MED ORDER — ALBUTEROL SULFATE (2.5 MG/3ML) 0.083% IN NEBU *I*
2.5000 mg | INHALATION_SOLUTION | Freq: Four times a day (QID) | RESPIRATORY_TRACT | Status: DC
Start: 2012-01-16 — End: 2012-01-17
  Administered 2012-01-16 – 2012-01-17 (×3): 2.5 mg via RESPIRATORY_TRACT
  Filled 2012-01-16 (×2): qty 3

## 2012-01-16 MED ORDER — GABAPENTIN 300 MG PO CAPS
900.0000 mg | ORAL_CAPSULE | Freq: Every evening | ORAL | Status: DC
Start: 2012-01-16 — End: 2012-01-17
  Administered 2012-01-16: 900 mg via ORAL

## 2012-01-16 MED ORDER — ZOLPIDEM TARTRATE 5 MG PO TABS *I*
5.0000 mg | ORAL_TABLET | Freq: Every evening | ORAL | Status: DC | PRN
Start: 2012-01-16 — End: 2012-01-17
  Administered 2012-01-16: 5 mg via ORAL
  Filled 2012-01-16: qty 1

## 2012-01-16 NOTE — Telephone Encounter (Signed)
Visited pt in ED obs, there for asthma flare up

## 2012-01-16 NOTE — ED Notes (Signed)
Plan of Care     Neb treatments, antibiotics per order, prednisone per order, and pain control as needed.

## 2012-01-16 NOTE — ED Obs Notes (Signed)
ED OBSERVATION PROGRESS NOTE    Patient seen by me today, 01/16/2012 at 9:32 AM    Current patient status: Observation    Chief Complaint:   Chief Complaint   Patient presents with   . Shortness of Breath   . Chest Pain       Subjective:  The patient is a 61 yo man with PMHx sig for asthma, HTN, arthritis, depression, psoriasis who is s/p cervical laminectomy on 9/16.  Yesterday am he began wheezing.  He c/o sob and cough productive of thick yellow-brown phlegm.  He denies fever or chills.  He states that the cough increases the surgical site pain.  He states that his breathing is a little better today.    Nursing Pain Score:  Last Nursing documented pain:  0-10 Scale: 5 (01/16/12 1610)      Vitals: Patient Vitals for the past 24 hrs:   BP Temp Temp src Pulse Resp SpO2 Height Weight   01/16/12 0633 130/79 mmHg 37 C (98.6 F) TEMPORAL 71  16  95 % - -   01/16/12 0232 150/95 mmHg 37 C (98.6 F) TEMPORAL 80  16  94 % - -   01/15/12 2119 142/91 mmHg 36 C (96.8 F) TEMPORAL 87  - 97 % - -   01/15/12 2049 - - TEMPORAL - 18  - - -   01/15/12 1913 148/89 mmHg 37 C (98.6 F) TEMPORAL 88  16  98 % - -   01/15/12 1531 115/78 mmHg 36.4 C (97.5 F) - 72  16  96 % 1.803 m (5' 10.98") 91.627 kg (202 lb)       Physical Examination:  Physical Exam  Gen: 61 yo man in NAD  Lungs: Somewhat decreased excursion with diffuse expiratory wheezing  Heart: RRR. S1, S2  Abd: Soft. Nontender. BS+  Extr: no edema    EKG: none    Lab Results:  All labs in the last 24 hours   Recent Results (from the past 24 hour(s))   HOLD GREEN WITH GEL       Result Value Range    Hold Green (w/gel,spun) HOLD TUBE     HOLD BLUE       Result Value Range    Hold Blue HOLD TUBE     CBC AND DIFFERENTIAL    Collection Time    01/15/12  4:55 PM       Result Value Range    WBC 7.0  4.2 - 9.1 THOU/uL    RBC 4.2 (*) 4.6 - 6.1 MIL/uL    Hemoglobin 12.7 (*) 13.7 - 17.5 g/dL    Hematocrit 37 (*) 40 - 51 %    MCV 90  79 - 92 fL    RDW 13.2  11.6 - 14.4 %    Platelets 126  (*) 150 - 330 THOU/uL    Seg Neut % 58.1  34.0 - 67.9 %    Lymphocyte % 22.0  21.8 - 53.1 %    Monocyte % 11.4  5.3 - 12.2 %    Eosinophil % 8.1 (*) 0.8 - 7.0 %    Basophil % 0.4  0.2 - 1.2 %    Neut # K/uL 4.1  1.8 - 5.4 THOU/uL    Lymph # K/uL 1.6  1.3 - 3.6 THOU/uL    Mono # K/uL 0.8  0.3 - 0.8 THOU/uL    Eos # K/uL 0.6 (*) 0.0 - 0.5 THOU/uL    Baso # K/uL  0.0  0.0 - 0.1 THOU/uL   BASIC METABOLIC PANEL    Collection Time    01/15/12  4:55 PM       Result Value Range    Glucose 152 (*) 60 - 99 mg/dL    Sodium 161  096 - 045 mmol/L    Potassium 4.2  3.3 - 5.1 mmol/L    Chloride 104  96 - 108 mmol/L    CO2 28  20 - 28 mmol/L    Anion Gap 9  7 - 16    UN 17  6 - 20 mg/dL    Creatinine 4.09 (*) 0.67 - 1.17 mg/dL    GFR,Caucasian 63      GFR,Black 73      Calcium 9.3  8.6 - 10.2 mg/dL   RUQ PANEL    Collection Time    01/15/12  4:55 PM       Result Value Range    Amylase 49  28 - 100 U/L    Lipase 24  13 - 60 U/L    Total Protein 6.1 (*) 6.3 - 7.7 g/dL    Albumin 4.2  3.5 - 5.2 g/dL    Bilirubin,Total 0.7  0.0 - 1.2 mg/dL    Bilirubin,Direct 0.2  0.0 - 0.3 mg/dL    Alk Phos 69  40 - 811 U/L    AST 44  0 - 50 U/L    ALT 57 (*) 0 - 50 U/L   BASIC METABOLIC PANEL    Collection Time    01/16/12  6:43 AM       Result Value Range    Glucose 227 (*) 60 - 99 mg/dL    Sodium 914  782 - 956 mmol/L    Potassium 4.5  3.3 - 5.1 mmol/L    Chloride 103  96 - 108 mmol/L    CO2 26  20 - 28 mmol/L    Anion Gap 10  7 - 16    UN 16  6 - 20 mg/dL    Creatinine 2.13  0.86 - 1.17 mg/dL    GFR,Caucasian 76      GFR,Black 88      Calcium 9.2  8.6 - 10.2 mg/dL   CBC AND DIFFERENTIAL    Collection Time    01/16/12  6:43 AM       Result Value Range    WBC 6.5  4.2 - 9.1 THOU/uL    RBC 4.2 (*) 4.6 - 6.1 MIL/uL    Hemoglobin 12.7 (*) 13.7 - 17.5 g/dL    Hematocrit 37 (*) 40 - 51 %    MCV 88  79 - 92 fL    RDW 12.9  11.6 - 14.4 %    Platelets 144 (*) 150 - 330 THOU/uL    Seg Neut % 82.8 (*) 34.0 - 67.9 %    Lymphocyte % 13.6 (*) 21.8 - 53.1 %     Monocyte % 2.3 (*) 5.3 - 12.2 %    Eosinophil % 1.1  0.8 - 7.0 %    Basophil % 0.2  0.2 - 1.2 %    Neut # K/uL 5.3  1.8 - 5.4 THOU/uL    Lymph # K/uL 0.9 (*) 1.3 - 3.6 THOU/uL    Mono # K/uL 0.2 (*) 0.3 - 0.8 THOU/uL    Eos # K/uL 0.1  0.0 - 0.5 THOU/uL    Baso # K/uL 0.0  0.0 - 0.1 THOU/uL  Imaging findings: Ct Angio Chest    01/15/2012  IMPRESSION:   No evidence of pulmonary embolism.   END REPORT The consultation was reviewed and approved by an attending radiologist after exam interpretation with a radiologist in training or PA.     *chest Standard Frontal And Lateral Views    01/15/2012  IMPRESSION:   No acute cardiopulmonary disease.   END REPORT The consultation was reviewed and approved by an attending radiologist after exam interpretation with a radiologist in training or PA.       Assessment: 61 yo man with #1 - sob and wheezing most likely secondary to asthma exacerbation secondary to bronchitis.  PE was ruled out by CTA chest.  Will continue nebs, zithromax and prednisone as well as tylenol #3 and tessalon.                                                   #2 - HTN - continue metoprolol, lisinopril                                                   #3 - depression - continue bupropion    Plan: As above.  Re-evaluate.    Medically preferred DVT prophylaxis: None    Author: Kalman Shan, MD  Note created: 01/16/2012  at: 9:22 AM

## 2012-01-16 NOTE — ED Notes (Addendum)
Pt has no issues ambulating around the unit, gait steady. Came to the nurses station several times, asking questions about pain medications and breakfast order.

## 2012-01-16 NOTE — ED Notes (Signed)
Plan of Care   Reviewed with pt and includes pain control, medication administration per MAR, po ABT, AM labs. Maintain comfort. OBS provider following.

## 2012-01-17 LAB — BASIC METABOLIC PANEL
Anion Gap: 11 (ref 7–16)
CO2: 25 mmol/L (ref 20–28)
Calcium: 9.2 mg/dL (ref 8.6–10.2)
Chloride: 103 mmol/L (ref 96–108)
Creatinine: 1.01 mg/dL (ref 0.67–1.17)
GFR,Black: 93 *
GFR,Caucasian: 80 *
Glucose: 342 mg/dL — ABNORMAL HIGH (ref 60–99)
Lab: 24 mg/dL — ABNORMAL HIGH (ref 6–20)
Potassium: 4.4 mmol/L (ref 3.3–5.1)
Sodium: 139 mmol/L (ref 133–145)

## 2012-01-17 LAB — CBC AND DIFFERENTIAL
Baso # K/uL: 0 10*3/uL (ref 0.0–0.1)
Basophil %: 0.1 % — ABNORMAL LOW (ref 0.2–1.2)
Eos # K/uL: 0 10*3/uL (ref 0.0–0.5)
Eosinophil %: 0.3 % — ABNORMAL LOW (ref 0.8–7.0)
Hematocrit: 35 % — ABNORMAL LOW (ref 40–51)
Hemoglobin: 12.1 g/dL — ABNORMAL LOW (ref 13.7–17.5)
Lymph # K/uL: 1.4 10*3/uL (ref 1.3–3.6)
Lymphocyte %: 12.8 % — ABNORMAL LOW (ref 21.8–53.1)
MCV: 89 fL (ref 79–92)
Mono # K/uL: 0.9 10*3/uL — ABNORMAL HIGH (ref 0.3–0.8)
Monocyte %: 8.2 % (ref 5.3–12.2)
Neut # K/uL: 8.7 10*3/uL — ABNORMAL HIGH (ref 1.8–5.4)
Platelets: 134 10*3/uL — ABNORMAL LOW (ref 150–330)
RBC: 4 MIL/uL — ABNORMAL LOW (ref 4.6–6.1)
RDW: 12.9 % (ref 11.6–14.4)
Seg Neut %: 78.6 % — ABNORMAL HIGH (ref 34.0–67.9)
WBC: 11 10*3/uL — ABNORMAL HIGH (ref 4.2–9.1)

## 2012-01-17 MED ORDER — ALBUTEROL SULFATE HFA 108 (90 BASE) MCG/ACT IN AERS *I*
1.0000 | INHALATION_SPRAY | Freq: Four times a day (QID) | RESPIRATORY_TRACT | Status: AC | PRN
Start: 2012-01-17 — End: 2012-02-16

## 2012-01-17 MED ORDER — AEROCHAMBER Z-STAT PLUS/SMALL MISC *A*
Status: AC
Start: 2012-01-17 — End: ?

## 2012-01-17 MED ORDER — PREDNISONE 20 MG PO TABS *I*
20.0000 mg | ORAL_TABLET | Freq: Every day | ORAL | Status: AC
Start: 2012-01-18 — End: 2012-01-22

## 2012-01-17 MED ORDER — AZITHROMYCIN 250 MG PO TABS *I*
250.0000 mg | ORAL_TABLET | Freq: Every day | ORAL | Status: AC
Start: 2012-01-18 — End: 2012-01-20

## 2012-01-17 NOTE — ED Notes (Signed)
01/16/2012- 2200   Pt's IV infiltrated and removed. Per provider TS, pt does need require IV access.

## 2012-01-17 NOTE — ED Notes (Signed)
Pt resting quietly, no s/s disc.

## 2012-01-17 NOTE — Discharge Instructions (Signed)
For asthma/bronchitis  - take zithromax once a day for next 2 days starting tomorrow  - take prednisone, 40mg  daily for 2 days starting tomorrow, then 20mg  daily for 2 days then stop  - Use albuterol with spacer as instructed    Please follow up with your PCP within the next week for further evaluation and to be tested for diabetes      Return to the ER for any new or worsening symptoms.     Please limit carbs/sugar until seen by PCP.

## 2012-01-17 NOTE — ED Obs Notes (Signed)
ED OBSERVATION DISCHARGE NOTE    Patient seen by me today, 01/17/2012 at 8:00am.    Current patient status: Observation    Subjective:  61 year old male being evaluated in OBS for asthma exacerbation/bronchitis. Patient feels well this morning. He wants to go home. Denies any issues overnight.     Observation Stay Includes:  61 y.o.male who presented to the ED with Chief Complaint   Patient presents with   . Shortness of Breath   . Chest Pain       Last Nursing documented pain:  0-10 Scale: 0 (01/17/12 0711)      Vitals:  Patient Vitals for the past 24 hrs:   BP Temp Temp src Pulse Resp SpO2   01/17/12 0711 137/90 mmHg 36.6 C (97.9 F) TEMPORAL 74 18 95 %   01/17/12 0104 137/90 mmHg 37.1 C (98.8 F) TEMPORAL 108 18 93 %   01/16/12 2106 160/94 mmHg 36 C (96.8 F) TEMPORAL 99 18 94 %   01/16/12 1704 144/84 mmHg 37.1 C (98.8 F) TEMPORAL 83 18 92 %   01/16/12 1312 145/97 mmHg 36.8 C (98.2 F) - 56 18 100 %   01/16/12 1016 126/86 mmHg 37.5 C (99.5 F) TEMPORAL 95 20 93 %         Physical Exam:  Physical Exam   Constitutional: He is oriented to person, place, and time. He appears well-developed and well-nourished. No distress.   HENT:   Head: Normocephalic and atraumatic.   Mouth/Throat: Oropharynx is clear and moist.   Eyes: Conjunctivae are normal.   Neck: Normal range of motion.   Cardiovascular: Normal rate, regular rhythm, normal heart sounds and intact distal pulses.  Exam reveals no gallop and no friction rub.    No murmur heard.  Pulmonary/Chest: Effort normal. No respiratory distress. He has no wheezes. He has no rales. He exhibits no tenderness.   Abdominal: Soft. Bowel sounds are normal. He exhibits no distension. There is no tenderness.   Musculoskeletal: Normal range of motion. He exhibits no edema and no tenderness.   Neurological: He is alert and oriented to person, place, and time.   Skin: Skin is warm and dry. He is not diaphoretic.   Psychiatric: He has a normal mood and affect. His behavior is  normal. Judgment and thought content normal.       EKG: None  Labs:  All labs in the last 72 hours   Recent Results (from the past 72 hour(s))   HOLD GREEN WITH GEL       Result Value Range    Hold Green (w/gel,spun) HOLD TUBE     HOLD BLUE       Result Value Range    Hold Blue HOLD TUBE     CBC AND DIFFERENTIAL    Collection Time    01/15/12  4:55 PM       Result Value Range    WBC 7.0  4.2 - 9.1 THOU/uL    RBC 4.2 (*) 4.6 - 6.1 MIL/uL    Hemoglobin 12.7 (*) 13.7 - 17.5 g/dL    Hematocrit 37 (*) 40 - 51 %    MCV 90  79 - 92 fL    RDW 13.2  11.6 - 14.4 %    Platelets 126 (*) 150 - 330 THOU/uL    Seg Neut % 58.1  34.0 - 67.9 %    Lymphocyte % 22.0  21.8 - 53.1 %    Monocyte % 11.4  5.3 -  12.2 %    Eosinophil % 8.1 (*) 0.8 - 7.0 %    Basophil % 0.4  0.2 - 1.2 %    Neut # K/uL 4.1  1.8 - 5.4 THOU/uL    Lymph # K/uL 1.6  1.3 - 3.6 THOU/uL    Mono # K/uL 0.8  0.3 - 0.8 THOU/uL    Eos # K/uL 0.6 (*) 0.0 - 0.5 THOU/uL    Baso # K/uL 0.0  0.0 - 0.1 THOU/uL   BASIC METABOLIC PANEL    Collection Time    01/15/12  4:55 PM       Result Value Range    Glucose 152 (*) 60 - 99 mg/dL    Sodium 161  096 - 045 mmol/L    Potassium 4.2  3.3 - 5.1 mmol/L    Chloride 104  96 - 108 mmol/L    CO2 28  20 - 28 mmol/L    Anion Gap 9  7 - 16    UN 17  6 - 20 mg/dL    Creatinine 4.09 (*) 0.67 - 1.17 mg/dL    GFR,Caucasian 63      GFR,Black 73      Calcium 9.3  8.6 - 10.2 mg/dL   RUQ PANEL    Collection Time    01/15/12  4:55 PM       Result Value Range    Amylase 49  28 - 100 U/L    Lipase 24  13 - 60 U/L    Total Protein 6.1 (*) 6.3 - 7.7 g/dL    Albumin 4.2  3.5 - 5.2 g/dL    Bilirubin,Total 0.7  0.0 - 1.2 mg/dL    Bilirubin,Direct 0.2  0.0 - 0.3 mg/dL    Alk Phos 69  40 - 811 U/L    AST 44  0 - 50 U/L    ALT 57 (*) 0 - 50 U/L   BASIC METABOLIC PANEL    Collection Time    01/16/12  6:43 AM       Result Value Range    Glucose 227 (*) 60 - 99 mg/dL    Sodium 914  782 - 956 mmol/L    Potassium 4.5  3.3 - 5.1 mmol/L    Chloride 103  96 - 108 mmol/L     CO2 26  20 - 28 mmol/L    Anion Gap 10  7 - 16    UN 16  6 - 20 mg/dL    Creatinine 2.13  0.86 - 1.17 mg/dL    GFR,Caucasian 76      GFR,Black 88      Calcium 9.2  8.6 - 10.2 mg/dL   CBC AND DIFFERENTIAL    Collection Time    01/16/12  6:43 AM       Result Value Range    WBC 6.5  4.2 - 9.1 THOU/uL    RBC 4.2 (*) 4.6 - 6.1 MIL/uL    Hemoglobin 12.7 (*) 13.7 - 17.5 g/dL    Hematocrit 37 (*) 40 - 51 %    MCV 88  79 - 92 fL    RDW 12.9  11.6 - 14.4 %    Platelets 144 (*) 150 - 330 THOU/uL    Seg Neut % 82.8 (*) 34.0 - 67.9 %    Lymphocyte % 13.6 (*) 21.8 - 53.1 %    Monocyte % 2.3 (*) 5.3 - 12.2 %    Eosinophil % 1.1  0.8 -  7.0 %    Basophil % 0.2  0.2 - 1.2 %    Neut # K/uL 5.3  1.8 - 5.4 THOU/uL    Lymph # K/uL 0.9 (*) 1.3 - 3.6 THOU/uL    Mono # K/uL 0.2 (*) 0.3 - 0.8 THOU/uL    Eos # K/uL 0.1  0.0 - 0.5 THOU/uL    Baso # K/uL 0.0  0.0 - 0.1 THOU/uL   BASIC METABOLIC PANEL    Collection Time    01/17/12  5:24 AM       Result Value Range    Glucose 342 (*) 60 - 99 mg/dL    Sodium 161  096 - 045 mmol/L    Potassium 4.4  3.3 - 5.1 mmol/L    Chloride 103  96 - 108 mmol/L    CO2 25  20 - 28 mmol/L    Anion Gap 11  7 - 16    UN 24 (*) 6 - 20 mg/dL    Creatinine 4.09  8.11 - 1.17 mg/dL    GFR,Caucasian 80      GFR,Black 93      Calcium 9.2  8.6 - 10.2 mg/dL   CBC AND DIFFERENTIAL    Collection Time    01/17/12  5:24 AM       Result Value Range    WBC 11.0 (*) 4.2 - 9.1 THOU/uL    RBC 4.0 (*) 4.6 - 6.1 MIL/uL    Hemoglobin 12.1 (*) 13.7 - 17.5 g/dL    Hematocrit 35 (*) 40 - 51 %    MCV 89  79 - 92 fL    RDW 12.9  11.6 - 14.4 %    Platelets 134 (*) 150 - 330 THOU/uL    Seg Neut % 78.6 (*) 34.0 - 67.9 %    Lymphocyte % 12.8 (*) 21.8 - 53.1 %    Monocyte % 8.2  5.3 - 12.2 %    Eosinophil % 0.3 (*) 0.8 - 7.0 %    Basophil % 0.1 (*) 0.2 - 1.2 %    Neut # K/uL 8.7 (*) 1.8 - 5.4 THOU/uL    Lymph # K/uL 1.4  1.3 - 3.6 THOU/uL    Mono # K/uL 0.9 (*) 0.3 - 0.8 THOU/uL    Eos # K/uL 0.0  0.0 - 0.5 THOU/uL    Baso # K/uL 0.0  0.0 - 0.1  THOU/uL         Imaging findings: Ct Angio Chest    01/15/2012  IMPRESSION:   No evidence of pulmonary embolism.   END REPORT The consultation was reviewed and approved by an attending radiologist after exam interpretation with a radiologist in training or PA.     *chest Standard Frontal And Lateral Views    01/15/2012  IMPRESSION:   No acute cardiopulmonary disease.   END REPORT The consultation was reviewed and approved by an attending radiologist after exam interpretation with a radiologist in training or PA.     Cardiac Testing:  None  Consults: None    Assessment: 61 year old male with history of asthma, HTN, arthritis, depression, psoriasis , and s/p cervical laminectomy on 9/16 evaluated in the ED and placed in OBS for asthma exacerbation secondary to bronchitis    Plan:  Asthma/Bronchitis   - Continue zithromax x 3 more days   - Prednisone 60mg  given today then 40mg  PO x 2 days then 20mg  PO x 2 days   - Albuterol inhaler with  spacer prescription given   - Patient clinically improving     Hyperglycemia   - 152 --> 227 --> 342    - Likely exacerbated by steroids    - Will need diabetic workup with PCP within next week   - Advised on limiting carbohydrates     Continue with current management plan for all other diagnoses     Disposition: Home  Follow-up:  with in 1 week.  with  PCP  Smoking Cessation: NA    Supervising physician Dr. Priscille Loveless was immediately available.  Diagnoses that have been ruled out:   None   Diagnoses that are still under consideration:   None   Final diagnoses:   Bronchitis, not specified as acute or chronic   Asthma       Author: Deetta Perla, PA  Note created: 01/17/2012  at: 8:08 AM

## 2012-01-23 LAB — EKG 12-LEAD
P: 52 degrees
QRS: -10 degrees
Rate: 74 {beats}/min
Severity: BORDERLINE
Severity: BORDERLINE
Statement: BORDERLINE
T: -1 degrees

## 2012-02-06 ENCOUNTER — Encounter: Payer: Self-pay | Admitting: Neurosurgery

## 2012-02-06 ENCOUNTER — Ambulatory Visit: Payer: Self-pay | Admitting: Neurosurgery

## 2012-02-06 DIAGNOSIS — M5020 Other cervical disc displacement, unspecified cervical region: Secondary | ICD-10-CM

## 2012-02-06 NOTE — Progress Notes (Signed)
Post Op Visit Following: Left C7-T1 laminectomy and discectomy on 01/12/12    Interim History: 2 days following discharge he developed an asthma attack and spent 3 days in the Ed at [facility]  He was given IV prednisone 60mg , followed by 40mg  for 2 days, 20mg  for 2 days. 2 days after his IV prednisone dose his BG was 345  His left arm weakness and hand weakness persist.    Exam: Incision well healed. Slight limitation of cervical range of motion. Left hand intrinsic weakness, trace left tricep weakness.  Left C8 hypesthesia with atrophy of hand intrinsics    Plan:   Initiate physical therapy to strengthen his neck and arms  We will re evaluate him in 2 months

## 2012-02-17 ENCOUNTER — Telehealth: Payer: Self-pay | Admitting: Nurse Practitioner

## 2012-02-17 ENCOUNTER — Telehealth: Payer: Self-pay

## 2012-02-17 MED ORDER — GABAPENTIN 300 MG PO CAPS
ORAL_CAPSULE | ORAL | Status: DC
Start: 2012-02-17 — End: 2012-02-19

## 2012-02-17 NOTE — Telephone Encounter (Signed)
Patient asked for 90 day supply of gabapentin through mail order pharmacy. Will save him 15 dollars.

## 2012-02-17 NOTE — Telephone Encounter (Signed)
Pt.'s rx from [facility] will not come for a month.  He needs a rx for gabapentin sent to [facility] [address], [place]. [phone] for a 1 month supply.

## 2012-02-18 ENCOUNTER — Telehealth: Payer: Self-pay | Admitting: Neurosurgery

## 2012-02-18 NOTE — Telephone Encounter (Signed)
Mr. Doolen needs script for gabapentin called in today.  Pt states it will take [facility] 10 days to mail him the one you sent.  Pt also has ?'s regarding surgery.  Please call him at [phone].  Pt would like you to leave him a number where he can get back to you if he is not at home.

## 2012-02-19 ENCOUNTER — Telehealth: Payer: Self-pay | Admitting: Thoracic Diseases

## 2012-02-19 MED ORDER — GABAPENTIN 300 MG PO CAPS
ORAL_CAPSULE | ORAL | Status: DC
Start: 2012-02-19 — End: 2014-06-21

## 2012-02-19 NOTE — Telephone Encounter (Signed)
His [facility] is [facility] in [place]. Phone #:  [phone].

## 2012-03-10 ENCOUNTER — Telehealth: Payer: Self-pay

## 2012-03-10 NOTE — Telephone Encounter (Signed)
Mr. Bounds was at physical therapy today and when instructed to put left arm on head (a stretch) he felt a sharp pain in neck and back.  He is very concerned and would appreciate a call back as soon as you can.  Ph# [phone]    KPS

## 2012-04-09 ENCOUNTER — Ambulatory Visit: Payer: Self-pay | Admitting: Neurosurgery

## 2012-04-09 DIAGNOSIS — M5010 Cervical disc disorder with radiculopathy, unspecified cervical region: Secondary | ICD-10-CM

## 2012-04-09 NOTE — Progress Notes (Signed)
Dear Dr. Randa Lynn                                    I had the pleasure of re evaluating Russell Chaney in consideration of his left C 7-T1 laminectomy and discectomy    Russell Chaney is a 61 y.o. male who has made steady progress with Physical therapy. His left hand weakness is less, he continues to do daily focused hand exercises. His hand often fatigues within 15 minutes. He is concerned about the atrophy in his hand  PSH: 01/12/12 Left C 7-T1 laminectomy and discectomy    IMAGING:none new    Review of Systems:see erecord  See problem list in eRecord    Physical Exam  General:  Awake, alert, no acute distress.  Neck:  Supple, full CROM  No radiating pain with ROM manuevers  Musculoskeletal:   Gait steady and well balanced    Neurologic:   Alert and oriented x 3   Strength Left hand intrinsic weakness persists  Sensation - Left C8 hypesthesia  DTRs normoactive and symmetrical  Finger taps slowed on the left    Conclusion:  Russell Chaney presents with slowly improving Left C8 radiculopathy and hand intrinsic weakness from a large left C7-T1 disc herniation.  As we noted pre op, the Radiologist did not pick up on the large disc herniation. He went 4 months with severe pain and nerve root compression  We are optimistic his recovery will continue for several months.  We will re evaluate him in 6 months  Russell Hocking, NP on 04/09/2012 at 5:01 PM

## 2012-05-25 ENCOUNTER — Ambulatory Visit: Payer: Self-pay | Admitting: Dermatology

## 2012-07-22 ENCOUNTER — Ambulatory Visit: Payer: Self-pay | Admitting: Dermatology

## 2012-07-22 ENCOUNTER — Encounter: Payer: Self-pay | Admitting: Dermatology

## 2012-07-22 VITALS — Ht 72.0 in | Wt 205.0 lb

## 2012-07-22 DIAGNOSIS — L409 Psoriasis, unspecified: Secondary | ICD-10-CM

## 2012-07-22 DIAGNOSIS — D225 Melanocytic nevi of trunk: Secondary | ICD-10-CM

## 2012-07-28 ENCOUNTER — Encounter: Payer: Self-pay | Admitting: Dermatology

## 2012-07-28 DIAGNOSIS — D225 Melanocytic nevi of trunk: Secondary | ICD-10-CM | POA: Insufficient documentation

## 2012-07-28 NOTE — Progress Notes (Signed)
See Notes scanned under media tab

## 2012-09-03 ENCOUNTER — Ambulatory Visit: Payer: Self-pay | Admitting: Neurosurgery

## 2012-09-24 ENCOUNTER — Ambulatory Visit: Payer: Self-pay | Admitting: Neurosurgery

## 2012-09-24 ENCOUNTER — Encounter: Payer: Self-pay | Admitting: Neurosurgery

## 2012-09-24 DIAGNOSIS — M5412 Radiculopathy, cervical region: Secondary | ICD-10-CM

## 2012-09-24 NOTE — Progress Notes (Signed)
Dear Tobi Bastos:                               I had the pleasure of re evaluating Jeret Kochan in consideration of his left C 7-T1 laminectomy and discectomy    [name] has made steady progress with Physical therapy. His left hand weakness is less, he continues to do daily focused hand exercises and he feels that his strength is improving.  He remains concerned about his left hand atrophy.    PSH: 01/12/12 Left C 7-T1 laminectomy and discectomy    IMAGING: None    Review of Systems:see erecord  See problem list in eRecord    Physical Exam:  Vital Signs:  There were no vitals taken for this visit.  General:  Awake, alert, no acute distress.  Neck:  Supple, full CROM  No radiating pain with ROM manuevers  Musculoskeletal:   Gait steady and well balanced  Neurologic:   Alert and oriented x 3   Strength 5/5 bilaterally   Sensation intact to pin prick in all extremities  DTRs normoactive and symmetrical  Finger taps normal    Conclusion: Russell Chaney is doing well following surgery.  His strength is slowly improving in the L hand, although his intrinsic atrophy is unlikely to improve in a significant way.  We will plan to see him in 4 months and to continue to monitor his progress.

## 2013-02-24 ENCOUNTER — Encounter: Payer: Self-pay | Admitting: Neurosurgery

## 2013-02-24 ENCOUNTER — Ambulatory Visit: Payer: Self-pay | Admitting: Neurosurgery

## 2013-02-24 VITALS — BP 99/58 | HR 55 | Ht 71.5 in | Wt 204.0 lb

## 2013-02-24 DIAGNOSIS — M549 Dorsalgia, unspecified: Secondary | ICD-10-CM

## 2013-02-24 NOTE — Progress Notes (Signed)
Dear Dr. Randa Lynn:                            I had the pleasure of re evaluating Xandar Palazzolo in consideration of his new complaint of L LS region pain which radiates down the L buttock, and proximal L thigh to the L mid thigh.    This started 3 months ago after he "popped my R calf muscle" and has not abated at all over the time    Running, walking, going up stairs makes this worse.    Sitting and recumbency improves it.    PSH: No lumbar surgery    IMAGING: None    Review of Systems:see erecord  See problem list in eRecord    Physical Exam:  Vital Signs:  BP 99/58  Pulse 55  Ht 1.816 m (5' 11.5")  Wt 92.534 kg (204 lb)  BMI 28.06 kg/m2  General:  Awake, alert, no acute distress.  Neck:  Supple, full CROM  No radiating pain with ROM manuevers  Musculoskeletal:   Gait steady and well balanced  No weakness on toe and heel walking  Back: Well-aligned, non-tender to palpation  Full LROM without leg pain  Patrick's test normal  SLR normal on the R; on the L produced radiating L leg pain  Neurologic:   Alert and oriented x 3   Strength 5/5 bilaterally   DTRs normoactive and symmetrical at the AJ and KJ  Finger taps normal    Conclusion:  Varney Haaf presents with new onset back and radiating L leg pain.  We will order a lumbar MRI and see him in follow up.

## 2013-03-23 ENCOUNTER — Encounter: Payer: Self-pay | Admitting: Gastroenterology

## 2013-04-20 ENCOUNTER — Other Ambulatory Visit: Payer: Self-pay | Admitting: Gastroenterology

## 2013-04-29 ENCOUNTER — Encounter: Payer: Self-pay | Admitting: Neurosurgery

## 2013-04-29 ENCOUNTER — Ambulatory Visit: Payer: Self-pay | Admitting: Neurosurgery

## 2013-04-29 DIAGNOSIS — I639 Cerebral infarction, unspecified: Secondary | ICD-10-CM

## 2013-04-29 DIAGNOSIS — M5136 Other intervertebral disc degeneration, lumbar region: Secondary | ICD-10-CM | POA: Insufficient documentation

## 2013-04-29 HISTORY — DX: Other intervertebral disc degeneration, lumbar region: M51.36

## 2013-04-29 NOTE — Progress Notes (Addendum)
Dear Dr. Arline Asp:   I had the pleasure of re evaluating Russell Chaney in consideration of his Left  LS region pain which radiates down the L buttock, and proximal L thigh to the L mid thigh.   This started 5 months ago after he "popped my R calf muscle" and has not abated at all over the time   Running, walking, going up stairs makes this worse. Playing Pickle ball increases his leg pain    Sitting and recumbency improves it.   PSH: Sept 2013 Left C7-T1 laminectomy and discectomy    IMAGING: Lumbar MRI - osteophyte pre foraminal on the left at L5-S1  Large Right renal cyst, smaller cysts on the left    Review of Systems: 8 systems reviewed  See problem list in eRecord   Physical Exam:   General:   Awake, alert, no acute distress.   Neck:   Supple, full CROM   No radiating pain with ROM manuevers   Musculoskeletal:   Gait steady and well balanced   No weakness on toe and heel walking   Back: Well-aligned, non-tender to palpation   Full LROM without leg pain   Patrick's test normal   SLR normal on the R; on the L produced radiating L leg pain into the posterior thigh ending in the calf  Atrophy of the left APB muscle  Neurologic:   Alert and oriented x 3   Strength 5/5 bilaterally   Sensation - left C8 hypesthesia  DTRs normoactive and symmetrical at the AJ and KJ   Finger taps normal   Conclusion: [name] has a mild chronic left L5 radiculopathy.   He is currently dealing with issues related to an enlarged prostate and an episode of right visual loss.  We will re address his left leg pain once he has completed treatment for his other health issues.  We have made a referral to the TIA clinic at [facility].

## 2013-05-16 ENCOUNTER — Telehealth: Payer: Self-pay

## 2013-05-16 NOTE — Telephone Encounter (Signed)
Spoke with [facility] in scheduling. There is a new patient visit available tomorrow at 1pm with Dr. Claiborne Billings. Patient should be scheduled at that time.  Ranell Skibinski Skipper Cliche, NP

## 2013-05-16 NOTE — Telephone Encounter (Signed)
optho provider calling requesting patient be seen in next few days for appt.  Patient has had symptoms starting christmas, newer symptoms starting within week.  Patient has appt 1/28 following recommendation of stroke sheduling flow chart but would like appt in next few days.  Please advise.

## 2013-05-18 ENCOUNTER — Ambulatory Visit: Payer: Self-pay | Admitting: Neurology

## 2013-05-18 ENCOUNTER — Encounter: Payer: Self-pay | Admitting: Neurology

## 2013-05-18 VITALS — BP 101/65 | HR 67 | Ht 71.0 in | Wt 195.0 lb

## 2013-05-18 DIAGNOSIS — G20 Parkinson's disease: Secondary | ICD-10-CM

## 2013-05-18 DIAGNOSIS — H539 Unspecified visual disturbance: Secondary | ICD-10-CM

## 2013-05-18 DIAGNOSIS — E785 Hyperlipidemia, unspecified: Secondary | ICD-10-CM

## 2013-05-18 DIAGNOSIS — I10 Essential (primary) hypertension: Secondary | ICD-10-CM

## 2013-05-18 NOTE — Patient Instructions (Signed)
[facility]   COMPREHENSIVE STROKE CENTER CLINIC PATIENT INSTRUCTIONS:    Continue on aspirin (325 mg daily), blood pressure medication, and statin to help prevent another TIA or stroke.    You should continue to address your medical risk factors - with the following goals in mind: SBP <140, 50% reduction of LDL.    You should modify your diet and lifestyle - including salt restriction; weight loss; consumption of a diet rich in fruits, vegetables, and low-fat dairy products; regular aerobic physical activity (at least 30 minutes 1-3 times a week); and limited alcohol consumption.    I will suggest to your cardiologist's office to perform a 48 hour Holter monitoring.    My office will refer you for a specialist evaluation for signs of parkinsonism.    Call 9-1-1 immediately if you experience:  - Sudden weakness or numbness of the face, arm or leg, especially on one side of the body  - Sudden confusion, trouble speaking or understanding  - Sudden trouble seeing in one or both eyes  - Sudden trouble walking, dizziness, loss of balance or coordination    Follow-up in the clinic as needed.

## 2013-05-18 NOTE — Progress Notes (Signed)
Russell Chaney was seen in the [facility] clinic today for evaluation and management of transient vision loss.    HISTORY OF PRESENT ILLNESS:  Russell Chaney is a 63 y.o. male who reports this past Christmas eve experiencing a transient (15 minute) episode of possible right monocular vision loss.  This was painless.  He did experience possible associated symptoms of bitemporal headache and chest pain.  He denied any further associated neurologic features (i.e. change of speech, swallowing, limb strength, or balance) at that time.  The headache lasted upwards of 30 minutes.  He denied any associated light or sound sensitivity or nausea.  The patient was started on aspirin and Plavix following this event.    The patient subsequently underwent an ophthalmology and cardiology evaluation.  Though not normal, his visual field testing failed to demonstrate a central field defect or homonymous field defect.  His echocardiogram and EKG was unremarkable.  He underwent MRI testing (see below).  Carotid ultrasound at that time was reportedly normal.    The patient's known vascular risk factors are noted below.    I personally reviewed the patient's available laboratory and radiology studies.  I summarized the test results for the patient and his/her family.  Please see eRecord for full details.    Also of note, there have been a number of medication changes (through Dr. Ena Dawley of psychiatry) in recent weeks involving discontinuation of Lexapro and staring of Cymbalta, Abilify, and Ritalin.    The patient denied experiencing any recurrent vision loss, though has noticed intermittent slurring and dizziness (often positionally related with standing).  The patient reports a longer standing history of chronic, near daily headaches, in a similar location and varying in intensity from 3/10 to 7/10.    Stroke/TIA Specific Data:    11/26/11 MRI head - DWI negative; FLAIR showed minimal juxtacortical  small vessel disease; GRE negative.  04/20/13 MRI head - DWI negative; FLAIR showed minimal juxtacortical small vessel disease (no change); post-contrast T1 negative.  04/20/13 MRA head and neck - negative.    LDL 77     Possible Contributing History:  Recent Head or Neck Trauma: [x]  No []  Yes   Associated Neck, Jaw, or Occiput Pain: [x]  No []  Yes   Associated Chest Symptoms: []  No [x]  Yes     Stroke Specific HPI Questions:  Dysphagia: [x]  No []  Yes   Daytime Fatigue: [x]  No []  Yes   Snoring/Apnea: [x]  No []  Yes     Past Medical History   Diagnosis Date    Arthritis     Depression     Hypertension     Heart valve disease     Psoriasis     Displacement of cervical intervertebral disc without myelopathy 01/05/2012     Left C7-T1    Degeneration of cervical intervertebral disc 01/05/2012    Asthma     Lumbar degenerative disc disease 04/29/2013     Left L5-S1     Past Surgical History   Procedure Laterality Date    Hernia repair  1994    Cholecystectomy  2012    Sinus surgery  2013    Bladder surgery  2008    Prostate surgery  2002 and 2004    Right parotidectomy Right 1992    Spine surgery  Sept 2013     Left C7-T1 laminectomy and discectomy     Family History   Problem Relation Age of Onset    Cancer Mother  Colon cancer in remission.    Cancer Father      Lung and throat.    Stroke Neg Hx      Stroke Specific [facility] Questions:  Infarct: [x]  No []  Yes   ICH: [x]  No []  Yes   Intracranial aneurysm: [x]  No []  Yes      REVIEW OF SYSTEMS:  10+ obtained and reviewed.  See scanned documentation for further details.    No Known Allergies (drug, envir, food or latex)    MEDICATIONS:  The patient has a current medication list which includes the following prescription(s): clopidogrel, duloxetine, ibuprofen, methylphenidate, pantoprazole, zolpidem, aspirin, albuterol, lisinopril, metoprolol, atorvastatin, fish oil, multiple vitamins-minerals, amoxicillin, methylprednisolone, gabapentin, aerochamber z-stat  plus/small, escitalopram, and bupropion.    GENERAL MEDICAL EXAMINATION:  BP 101/65   Pulse 67   Ht 1.803 m (5\' 11" )   Wt 88.451 kg (195 lb)   BMI 27.21 kg/m2   Normal Abnormal   General Appearance: []            [x]       Appeared psychomotor slowed with motor and verbal responses; flat affect   Eyes: []            [x]       Reduced eye blinks   Cardiovascular: [x]                                          []            MUSCULOSKELETAL EXAMINATION:   Normal Abnormal   Gait/Station: []            [x]       Normal base and casual gait; positive retropulsion testing   Muscle Strength: [x]            []          Muscle Tone: [x]            []            NEUROLOGICAL EXAMINATION:    Normal Abnormal   Orientation: [x]            []          Memory: [x]            []          Attention/Concentration: [x]                                          []          Language: []            [x]       Slowed   Fund of Knowledge: [x]            []          2nd CN: [x]            []          3rd, 4th, 6th CN: [x]            []          5th CN: [x]            []          7th CN: [x]            []          9th CN: [x]            []   11th CN: [x]            []          12th CN: [x]            []          Sensation: [x]            []          Deep Tendon Reflexes: [x]            []          Coordination: []            [x]       Bilateral slowed RAM        IMPRESSION:  Recent event involving transient right monocular vision loss.  May have reflected transient retinal ischemia.  No well-defined large vessel athero embolic or cardio embolic risk identified.  MRIs show a stable, mild burden of white matter disease consistent with the patient's known vascular RFs (see below).      Exam features to suggest psychomotor slowing and features of possible parkinsonism.  Uncertain whether this may be related to possible drug toxicity or early signs of a neurodegenerative process.      RECOMMENDATIONS:  - The patient should continue on aspirin (may d/c Plavix), blood pressure  medication, and statin for secondary stroke prevention.  - The patient's risk factors should continue to be addressed - with the following goals in mind: SBP <130, 50% reduction of LDL.  - Consider the following diagnostic studies - Holter monitor to evaluate for possible cardiac arrhythmia.  - My office ordered the following referrals - [facility].  In the interim, limit/reduce neuropsychiatric medications as able.  - My office educated the patient re: stroke risk factors, warning symptoms/signs, the availability of time-sensitive therapy, and the importance of activating EMS.  - Follow-up in clinic as needed.     Please contact my office with any questions or concerns.    This note was typed at point of care. A reasonable attempt at proofreading has been made, however, errors may occur.    PERTINENT VISIT DIAGNOSES:  1. Visual disturbance     2. HTN (hypertension)     3. HLD (hyperlipidemia)     4. Parkinsonism  AMB REFERRAL TO NEUROLOGY      PERTINENT SOCIAL HISTORY:  History   Smoking status    Never Smoker    Smokeless tobacco    Never Used      Author: Jone Baseman, MD  as of: 05/18/2013  at: 3:38 PM

## 2013-05-24 ENCOUNTER — Ambulatory Visit: Payer: Self-pay | Admitting: Neurology

## 2013-05-25 ENCOUNTER — Ambulatory Visit: Payer: Self-pay | Admitting: Neurology

## 2013-06-16 ENCOUNTER — Encounter: Payer: Self-pay | Admitting: Dermatology

## 2013-06-16 ENCOUNTER — Ambulatory Visit: Payer: Self-pay | Admitting: Dermatology

## 2013-06-16 VITALS — BP 126/84 | Ht 71.0 in | Wt 200.0 lb

## 2013-06-16 DIAGNOSIS — L409 Psoriasis, unspecified: Secondary | ICD-10-CM

## 2013-06-16 DIAGNOSIS — D225 Melanocytic nevi of trunk: Secondary | ICD-10-CM

## 2013-06-16 DIAGNOSIS — L821 Other seborrheic keratosis: Secondary | ICD-10-CM

## 2013-06-21 ENCOUNTER — Telehealth: Payer: Self-pay | Admitting: Neurology

## 2013-06-21 NOTE — Telephone Encounter (Signed)
Patient calling for referral to Dr. Jerene Pitch, cardiologist, [phone] [facility]) for Holter Monitor.  Wants to know if this was sent so he dos not have to come to [place].

## 2013-06-21 NOTE — Telephone Encounter (Signed)
Patient returning your call.   Thanks.    When referral is in system, please fax to [phone]

## 2013-06-21 NOTE — Telephone Encounter (Signed)
Reviewed Dr. Holmquist's note. He recommended Holter and based on patient's request to have completed closer to home the note with recommendation was faxed to patient's cardiologist, Dr. Jerene Pitch.   Have attempted to contact patient to clarify that Dr. Jerene Pitch is already his cardiologist, in which case he should not need a new referral and a Holter can be ordered through his office. If Dr. Jerene Pitch is not the patient's cardiologist and he requires a consult or order for the Holter we will attempt to do this.  Deneane Stifter Skipper Cliche, NP

## 2013-06-22 ENCOUNTER — Encounter: Payer: Self-pay | Admitting: Dermatology

## 2013-06-22 DIAGNOSIS — L821 Other seborrheic keratosis: Secondary | ICD-10-CM | POA: Insufficient documentation

## 2013-06-22 NOTE — Progress Notes (Signed)
See Notes scanned under media tab

## 2013-07-06 ENCOUNTER — Ambulatory Visit: Payer: Self-pay | Admitting: Dermatology

## 2013-07-06 ENCOUNTER — Encounter: Payer: Self-pay | Admitting: Dermatology

## 2013-07-06 VITALS — BP 122/64 | Ht 71.0 in | Wt 195.0 lb

## 2013-07-06 DIAGNOSIS — B029 Zoster without complications: Secondary | ICD-10-CM | POA: Insufficient documentation

## 2013-07-06 MED ORDER — VALACYCLOVIR HCL 1000 MG PO TABS *I*
ORAL_TABLET | ORAL | Status: DC
Start: 2013-07-06 — End: 2013-09-06

## 2013-07-06 NOTE — Progress Notes (Signed)
See Notes scanned under media tab

## 2013-07-29 ENCOUNTER — Ambulatory Visit: Payer: Self-pay | Admitting: Dermatology

## 2013-09-06 ENCOUNTER — Encounter: Payer: Self-pay | Admitting: Neurology

## 2013-09-06 ENCOUNTER — Ambulatory Visit: Payer: Self-pay | Admitting: Neurology

## 2013-09-06 VITALS — BP 117/74 | HR 102 | Ht 71.5 in | Wt 210.0 lb

## 2013-09-06 DIAGNOSIS — G20 Parkinson's disease: Secondary | ICD-10-CM

## 2013-09-06 MED ORDER — CARBIDOPA-LEVODOPA 25-100 MG PO TABS *I*
1.0000 | ORAL_TABLET | Freq: Three times a day (TID) | ORAL | Status: DC
Start: 2013-09-06 — End: 2013-10-17

## 2013-09-06 NOTE — Progress Notes (Addendum)
 Movement Disorders Clinic-New Patient Visit    Dear Dr. Randa Lynn,     We had the pleasure of seeing your patient [name] in our [place] [facility] clinic today.  As you know he is a 63 y.o. male who was referred to our clinic for evaluation of possible Parkinsonism.  He was referred to [place] from one our stroke neurologists who saw him 3 months ago and noted some possible features of parkinsonism.  In December of 2014 he was evaluated for right transient monocular vision loss.  However a thorough work up showed no clear atherosclerosis or cardioembolic source for this transient vision loss with MRI showing mild white matter disease and thus it was thought his visual loss may have been due to transient retinal ischemia.     Regarding changes over the past 1 to 2 years notes more difficulty with balance when trying to put pants on or going up and down stairs he almost falls.  Also noticing reduced agility while playing sports.  He says he has always been very athletic and agile person so this is very concerning to him.  He feels generalized stiffness and is having difficulty with putting on socks and dexterity of buttoning, putting a belt on and zipping up zippers and thinks this has been a very gradual process since at least December.  He feels he has also developed a tremor in his left hand about 6 months ago and just recently his right hand (in the past month) and occurs at rest and appears to intensify with holding a fork.  He has also had difficulty with handwriting especially in the last year has gotten smaller.  Per his wife who accompanied him to the visit today he has had progressively increased slowing over the past couple of years.  Increased difficulty turning in bed, opening jars, getting out of the car (patient states he has to sometimes lift his legs with his hands to get out); also some decreased facial expression over the past few months as well.    Review of PD  symptoms:    Gait/balance:  Falls-He has almost fallen a few times primarily when putting pants on or going up and down stairs    Neuropsychiatric:  Mood-He has had depression for at least 25 years or more.  Has been hospitalized twice (2005 and 2010) for severe depression no thoughts of suicide.  He has considered ECT but not undergone treatment.  Sleep-May take 30 to 60 min to fall asleep and takes ambien PRN, usually stays asleep then and has sleep apnea and previously used CPAP, getting several hours of sleep at night but takes a nap almost every day or even 2 times in a day that adds up to 2-4 hrs a day.  Thinks he sleeps 10 to 12 hrs a night.  Cognition-Has been having difficulties with memory for at least 1-2 years.  He sometimes has difficulty remembering people's names or having difficulty remembering what he did with certain people.  He feels this is more short term.   Fatigue-takes ritalin for this and his wife feels this has improved his energy level though he does not think so.  He has PTSD from [place] and a traumatic childhood and talks a lot at a night and moves arms and legs at night a lot.  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    Medical History  Past Medical History   Diagnosis Date   . Arthritis    . Depression    . Hypertension    . Heart valve disease    . Psoriasis    . Displacement of  cervical intervertebral disc without myelopathy 01/05/2012     Left C7-T1   . Degeneration of cervical intervertebral disc 01/05/2012   . Asthma    . Lumbar degenerative disc disease 04/29/2013     Left L5-S1     Past Surgical History   Procedure Laterality Date   . Hernia repair  1994   . Cholecystectomy  2012   . Sinus surgery  2013   . Bladder surgery  2008   . Prostate surgery  2002 and 2004   . Right parotidectomy Right 1992   . Spine surgery  Sept 2013     Left C7-T1 laminectomy and discectomy     Medications  Current Outpatient Prescriptions   Medication Sig   . ARIPiprazole (ABILIFY) 5 MG tablet Take 2.5 mg by mouth daily   . budesonide-formoterol (SYMBICORT) 80-4.5 MCG/ACT inhaler Inhale 2 puffs into the lungs 2 times daily   Shake well before each use.   . gabapentin (NEURONTIN) 300 MG capsule Pt takes 600 mg at 9 am and 3 pm and 900 mg at HS.   . carbidopa-levodopa (SINEMET) 25-100 MG per tablet Take 1 tablet by mouth 3 times daily   . DULoxetine (CYMBALTA) 60 MG capsule Take 120 mg by mouth daily      . ibuprofen (ADVIL,MOTRIN) 800 MG tablet    . methylphenidate (RITALIN) 10 MG tablet    . pantoprazole (PROTONIX) 40 MG EC tablet    . zolpidem (AMBIEN CR) 12.5 MG CR tablet    . aspirin 325 MG tablet Take 325 mg by mouth daily   . AEROCHAMBER Z-STAT PLUS/SMALL spacer device Use as directed   . albuterol (PROVENTIL, VENTOLIN, PROAIR HFA) 108 (90 BASE) MCG/ACT inhaler Inhale 1-2 puffs into the lungs Q4-6H PRN   Shake well before each use.   . lisinopril (PRINIVIL,ZESTRIL) 20 MG tablet Take 10 mg by mouth every morning      . metoprolol (LOPRESSOR) 25 MG tablet Take 12.5 mg by mouth every morning      . atorvastatin (LIPITOR) 40 MG tablet Take 40 mg by mouth every morning      . FISH OIL Take 2,400 mg by mouth 2 times daily      . Multiple Vitamins-Minerals (EYE VITAMINS PO) Take 1 tablet by mouth every morning        Allergies  No Known Allergies (drug, envir, food or latex)    Family History  Family History    Problem Relation Age of Onset   . Cancer Mother      Colon cancer in remission.   . Cancer Father      Lung and throat.   . Stroke Neg Hx  Mother-dementia    To his knowledge no one has had PD or tremor (has two 2 brothers and one sister that are healthy).  He also has two children that are healthy.    Social History  History     Social History   . Marital Status: Married     Spouse Name: N/A     Number of Children: N/A   . Years of Education: N/A     Occupational History   . Not on file.     Social History Main Topics   . Smoking status: Never Smoker    . Smokeless tobacco: Never Used   . Alcohol Use: No   . Drug Use: No   . Sexual Activity:     Partners: Female     Other Topics Concern   . Not on file     Social History Narrative     Exam  Filed Vitals:    09/06/13 1011   BP: 117/74   Pulse: 102   Height:    Weight:    -was orthostatic based on heart rate not blood pressure please refer to flow sheet    Mental Status: he had normal comprehension and fund of knowledge, repetition was intact and her remembered 2/3 objects  Cranial Nerves: Pupils were equal round and reactive to light and vision full to confrontation. Extraocular muscles were full range, normal saccades, and no nystagmus.  Face was symmetric and there was mild hypomimia, no hypophonia or dysarthria.  Tongue was midline and palate elevated equally.   Motor: Muscle bulk was normal with mild rigidity in the lower extremities.  Finger taps, pronation/supination were slowed on the left>right.  Heel taps were normal.  There was bilateral mild postural tremor present with intermittent left hand tremor. There was mild to moderate bradykinesia.  Sensory: Decreased to vibration below the ankle  Reflexes:Biceps, brachioradlialis, triceps, patellar, achilles 1+/1+. Plantar reflexes were down going.   Coordination: Finger nose finger was intact.  Gait: he was able to stand with arms crossed from a seated position, he had significant retropulsion on pull  test requiring the examiner to catch him.  Gait was slow with mild decreased arm swing on the left     PART III MOTOR EXAMINATION  INDICATE MEDICATION STATE:: Not on medications.  18. SPEECH:: Normal   19.  FACIAL EXPRESSION:: Min

## 2013-09-06 NOTE — Patient Instructions (Addendum)
It sounds like it would be a good idea to start Sinemet (the generic name is carbidopa/levodopa).  This is a medication that is taken up into the brain and made into dopamine (which is lacking in Parkinson's disease).  It is generally our most effective medication and is generally well tolerated.  The most common side effects are nausea or dizziness upon first standing up.  Any PD medication can cause confusion or hallucinations but these are rare early in the illness.  The medication is started at a very low dosage and then titrated up gradually.  We generally increase gradually to an initial goal of one tablet 3 times per day after a couple of weeks but you might end up needing a higher dosage (such as 2 tablets 3 times per day) - everyone is different.   As outlined in the table below.    Carbidopa/Levodopa (Sinemet) 25/100 Strength Tablet Titration Schedule    Day  AM NOON  PM   Week 1 1/2 1/2 1/2   Week 2 1 1 1          PD 101 information session on May 30th  Please see the [employer] [place] [facility] website for details re time/place  [email or web address]

## 2013-09-07 ENCOUNTER — Encounter: Payer: Self-pay | Admitting: Neurology

## 2013-09-07 DIAGNOSIS — G20 Parkinson's disease: Secondary | ICD-10-CM | POA: Insufficient documentation

## 2013-09-08 ENCOUNTER — Encounter: Payer: Self-pay | Admitting: Neurology

## 2013-10-16 ENCOUNTER — Encounter: Payer: Self-pay | Admitting: Neurology

## 2013-10-16 DIAGNOSIS — G20 Parkinson's disease: Secondary | ICD-10-CM

## 2013-10-17 MED ORDER — CARBIDOPA-LEVODOPA 25-100 MG PO TABS *I*
1.0000 | ORAL_TABLET | Freq: Three times a day (TID) | ORAL | Status: AC
Start: 2013-10-17 — End: ?

## 2013-12-13 ENCOUNTER — Encounter: Payer: Self-pay | Admitting: Neurology

## 2013-12-13 ENCOUNTER — Ambulatory Visit: Payer: Self-pay | Admitting: Neurology

## 2013-12-13 VITALS — BP 116/74 | HR 82 | Ht 72.24 in | Wt 204.2 lb

## 2013-12-13 DIAGNOSIS — G20 Parkinson's disease: Secondary | ICD-10-CM

## 2013-12-13 NOTE — Progress Notes (Signed)
We saw Russell Chaney today in follow-up for Parkinson disease.  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  He is accompanied by his wife.    Since his last visit, he feels like his tremors are improved and his gait/balance has improved and he is less prone to falling.  His wife has noticed improvement in the muscle spasms in his face.  He does report severe dry mouth.  He and his wife feel like he is in denial about his diagnosis. They are concerned about Sinemet timing--his wife has been hypervigilant about this, particularly in relation to meals.  He is still taking Abilify 5mg  daily for depression.  He does not think this medication is helping with his mood.    Current dopaminergic medications schedule:    Carbidopa-Levodopa 25-100mg  7AM 1PM 5PM       Control of dopamine-responsive motor symptoms on current regimen:  End of dose wearing off?  No   if yes:  estimated average daily OFF time=   Dose failures? No    Level of functional impairment during typical OFF time: No obvious OFF time.    Level of functional impairment during best ON time: Minimal, feels "younger" and that he can do what he used to do.    Dysphagia? Some phlegm and has some difficulty swallowing pills, which has been a problem for some time.  No problems with food or liquids.    Side effects of dopaminergic therapy:  Troublesome dyskinesia: No  Impulse control symptoms: No  Hallucinations: No  Symptomatic orthostasis: Sometimes when he gets up from bed, or standing up quickly.  No syncope.  Lower extremity edema: No    Gait/balance:  Falls?  No, balance has improved since starting Sinemet  If yes, document context / triggers if possible    Neuropsychiatric:  Mood:  OK.  His wife says he has been more grumpy--he used to have a trigger for this, but now it seems like he is this way all the time.  He is not aware of this.  He feels  depressed sometimes. He denies anxiety.  His mood worsens significantly in the winter.  Sleep:  No sleeping well.  He says this is probably due to his sleep apnea.  He has been diagnosed with sleep apnea years ago, but since his sinus surgery he has not be able to tolerate CPAP.  He says he needs to have another sleep study at the [place] before getting another machine.  He does have vivid dreams.  He shouts out (gives out military orders on occasion) and thrashes around in his sleep regularly.  His wife sleeps on the other side of the bed and has never been injured.  Cognition: He denies significant problems.  His wife has noticed some short-term memory problems--events that happened days ago for instance.    Autonomic:  Constipation: No  Urinary: No  Thermoregulation: No    Wellness activities:  Regular exercise? Yes, he plays pickleball three times per week.  Caregiver wellbeing? His wife says she "needs a rest, but is persevering."  Her mother passed away about 2 months ago.  She has a sister in a nursing home.  She feels pretty "burnt out."  She is more hypervigilant about the medication than he is.      Exam:  Filed Vitals:    12/13/13 0818 12/13/13 0820   BP:  111/69 116/74   Pulse: 78 82   Height: 1.835 m (6' 0.24")    Weight: 92.625 kg (204 lb 3.2 oz)        Alert with fluent speech.  Flow of thought is logical.  Affect is normal.  Recall of recent events is appropriately detailed.    Extraocular movements full.    Unified Parkinson Disease Rating Scale  PART III MOTOR EXAMINATION  INDICATE MEDICATION STATE:: Patient is on medication and does not experience motor fluctuations.    18. SPEECH:: Slight loss of expression, diction, and/or volume.  19.  FACIAL EXPRESSION:: Minimal hypomimia, could be normal "Poker Face".  20. A.) TREMOR AT REST: FACE, LIPS + CHIN: Normal  20.B.) Tremor at Rest: Right Hand:: Normal  20. C) Tremor at Rest: LEFT HAND:: Slight and infrequently present.  20. D.) Tremor at Rest: RIGHT  FOOT:: Normal  20. E.) Tremor at Rest: LEFT FOOT:: Normal  21. A.) Action or Postural Tremor of Hands: RIGHT HAND:: Absent  21. B.)  Action or Postural Tremor of Hands: LEFT HAND:: Absent (slight postural tremor)  22. A.) Rigidity:  Neck:: Absent  22. B.) Rigidity:  RUE:: Absent  22. C.) Rigidity: LUE:: Absent  22. D.)  Rigidity:  RLE:: Mild-to-moderate.  22. E.) Rigidity:  LLE:: Mild-to-moderate.  23. A.) Bradykinesia: Finger Taps Right Hand:: Normal  23. B.) Bradykinesia: Finger Taps: LEFT HAND:: Mild slowing and/or reduction in amplitude  24. A.)  Bradykinesia:  RIGHT HAND MOVEMENTS:: Normal  24. B.) Bradykinesia: LEFT HAND MOVEMENTS:: Mild slowing and/or reduction in amplitude.  25. A.) Bradykinesia: RAPID ALTERNATING MOVEMENTS OF RIGHT HAND (PRONATION-SUPINATION):: Normal  25 B.) Bradykinesia: RAPID ALERNATING MOVEMENTS OF LEFT HAND (PRONATION-SUPINATION):: Normal  26. A.)  Bradykinesia: RIGHT LEG AGILITY:: Mild slowing and/or reduction in amplitude.  26. B.) Bradykinesia: LEFT LEG AGILITY:: Mild slowing and/or reduction in amplitude.  27.  ARISING FROM CHAIR:: Normal  28.  POSTURE:: Not quite erect, slightly stooped posture, could be normal for older person.  29. GAIT:: Normal  30.  POSTURAL STABILITY:: Retropulsion, but recovers unaided.  31. BODY BRADYKINESIA AND HYPOKINESIA:: Minimal slowness, giving, movement a deliberate character, could be normal for some persons. Possibly, reduced amplitude.  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    Nonmotor comorbidities include: Dry mouth.  Depression. Sleeping problems -- OSA, RBD. Caregiver burn out.    Plan:  - continue Sinemet 25-100mg  TID, can take with meals at this point as long as he does not noticed decreased effectiveness, which we discussed with them  - still recommend discontinuing Abilify if able, particularly if it is not helping with his  depression as this medication can certainly worsen parkinsonism  - recommended good hydration, chewing gum to try to help with dry mouth  - encouraged continued exercise  - he plans to get another sleep study at the [place] in order to obtain another CPAP for known OSA  - provided information about PD101 and some local educational programs  - follow up in 6 months, sooner if problems arise    Gates Rigg, MD  Movement Disorders Fellow    Neurology Attending  I saw and evaluated the patient, verified key elements of the history and exam, and participated in management and decision-making.  I agree with the fellow's findings and plan of care as documented above.     Signed:  Lynnae Prude, MD PhD   [facility] electronic signature entry]

## 2014-04-13 ENCOUNTER — Encounter: Payer: Self-pay | Admitting: Dermatology

## 2014-04-13 ENCOUNTER — Ambulatory Visit: Payer: Self-pay | Admitting: Dermatology

## 2014-04-13 VITALS — Ht 71.0 in | Wt 198.0 lb

## 2014-04-13 DIAGNOSIS — D485 Neoplasm of uncertain behavior of skin: Secondary | ICD-10-CM | POA: Insufficient documentation

## 2014-04-13 DIAGNOSIS — L821 Other seborrheic keratosis: Secondary | ICD-10-CM

## 2014-04-13 DIAGNOSIS — D225 Melanocytic nevi of trunk: Secondary | ICD-10-CM

## 2014-04-13 DIAGNOSIS — L408 Other psoriasis: Secondary | ICD-10-CM

## 2014-04-13 NOTE — Progress Notes (Signed)
See Notes scanned under media tab

## 2014-04-17 LAB — SURGICAL PATHOLOGY

## 2014-04-18 ENCOUNTER — Telehealth: Payer: Self-pay

## 2014-04-18 NOTE — Telephone Encounter (Signed)
Spoke with patient about path results benign thickened irritated skin if bothersome would need to be excised-recommend general surgeon or hand surgeon due to size and location-gave pt phone #.

## 2014-06-21 ENCOUNTER — Ambulatory Visit: Payer: Self-pay | Admitting: Neurology

## 2014-06-21 ENCOUNTER — Encounter: Payer: Self-pay | Admitting: Neurology

## 2014-06-21 VITALS — BP 106/71 | HR 104 | Ht 71.0 in | Wt 192.0 lb

## 2014-06-21 DIAGNOSIS — N521 Erectile dysfunction due to diseases classified elsewhere: Secondary | ICD-10-CM

## 2014-06-21 DIAGNOSIS — G20 Parkinson's disease: Secondary | ICD-10-CM

## 2014-06-21 DIAGNOSIS — N529 Male erectile dysfunction, unspecified: Secondary | ICD-10-CM | POA: Insufficient documentation

## 2014-06-21 DIAGNOSIS — G4752 REM sleep behavior disorder: Secondary | ICD-10-CM | POA: Insufficient documentation

## 2014-06-21 DIAGNOSIS — M542 Cervicalgia: Secondary | ICD-10-CM

## 2014-06-21 DIAGNOSIS — R3911 Hesitancy of micturition: Secondary | ICD-10-CM

## 2014-06-21 MED ORDER — SILDENAFIL CITRATE 25 MG PO TABS *I*
25.0000 mg | ORAL_TABLET | Freq: Every day | ORAL | Status: AC | PRN
Start: 2014-06-21 — End: ?

## 2014-06-21 NOTE — Patient Instructions (Addendum)
1.  For sleep:   Try melatonin (over-the-counter medication), 2mg  or 3mg  tablets.   Take 1 tablets at bedtime; if you are still acting out your dreams, increase by 1 tablet every 2-3 nights to a maximum dose of 12 mg (6 of the 2mg  tablets or 4 of the 3mg  tablets)    2.  OK to just discontinue Ritalin.   (Hopefully appetite will improve after this is discontinued).    3.  For neck pain:  Physical therapy with Iran Sizer    [facility] [place].  [address] [place], [place] [address]  Phone: [phone]  Fax: [phone]    4.  DATSCAN -- stop cymbalta 3 days before the scan, resume after scan is done.    5.  I will refer to Dr. Lennie Muckle for urology evaluation.

## 2014-06-21 NOTE — Progress Notes (Signed)
I saw Russell Chaney today in followup for Parkinson disease    Current dopaminergic medications schedule:   carbi/levodopa 25/100  TID    He has noticed that appetite is poor, not sure if it is a side effect.  + weight loss (down 20lbs from 1 year ago)  Olfaction is not a contributor.  Used to have a robust appetite.    After my PD201 lecture last weekend he has shifted medication timing, now takes c/l a couple of hours after he eats.      Discussed the possibility that Ritalin may be suppressing his appetite.  He does not feel like Ritalin is helping with energy.  (Rx by Dr. Ena Dawley, psychiatry) He has been taking it for the past year.    Sleep study in [place] to evaluate for sleep apnea; also has night terrors & PTSD.  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    Control of dopamine-responsive motor symptoms on current regimen:  End of dose weaing off?   No  Feels like he is moving well.    Some restless fidgety movement with hands.  Circling his fingers (stereotypy not dyskinesia)    Side effects of dopaminergic therapy:  Troublesome dyskinesia -- no  Impulse control symptoms -- no  Nausea  -- ?  (suppressed appetite)  Hallucinations -- none  Symptomatic orthostasis -- occasional lightheadness standing up quickly ~once per week    Gait/balance:  falls  none    Neuropsychiatric:  Sleep  -- bad dreams, + acting out dreams.    Autonomic:  Erectile dysfunction -- difficult to get / maintain an erection    Also noticing more difficulty with urinating  (s/p prostate surgeries).  Has tried prostate medications.    Symptoms include interrupted stream, prolonged emptying    Wellness activities:  Regular exercise -- pickle ball 2 times per week; finds it helpful for eye-hand coordination, flexibility.      Neck soreness for a couple of months; trigger point injections & chiropractor; had cervical laminectomy 2 years ago with [facility].  Has had pain for ~2 months.      Exam:  Filed Vitals:    06/21/14 1137  06/21/14 1138   BP: 134/76 106/71   Pulse: 95 104   Height: 1.803 m (5\' 11" )    Weight: 87.091 kg (192 lb)        Alert with fluent speech.  Flow of thought is logical.  Affect is normal.  Recall of recent events is appropriately detailed.    No hypophonia or hypomimia; no global hypokinesia.  Minimal parkinsonism on exam.  Gait with normal stride length.      Impression:   likely idiopathic Parkinson disease, motor symptoms ~3 years duration     Treatment for dopamine-responsive motor symptoms is optimized with no wearing off.    He expressed interest in verifying the diagnosis with DATSCAN.  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  Anorexia -- I think more likely due to Ritalin rather than carbi/levodopa.  He is not finding it helpful, and I suggested he consider discontinuing it.  3.  Urinary symptoms most suggestive of obstruction due to prostatic hypertrophy, but would like to verify to ensure this is not neurogenic in the setting of PD  4.  erectile dysfunction -- offered viagra to use PRN; counseled re: potential for precipitating hypotension (he does have a ~20 point drop in SBP today although asymptomatic)  5.  Orthostatic BP drop, only occasionally symptomatic.  Counseled re: importance of hydration.  Will  monitor.  6.  Neck pain -- offered physical therapy      Plan:  --DATSCAN  --PT for neck pain  -- urology referral to evaluate urinary hesitancy; consider finasteride if BPH is contributing.  Low threshold for urodynamics if unclear whether BPH vs. PD is the main etiology.  please avoid anticholinergic and alpha blocking medications given the orthostatic BP drop    -- d/c Ritalin      For time-based billing:  In addition to E&M services documented above, I spent  >50% of the 60 minutes face to face visit on counseling and education for chronic disease symptom management in the following areas:    _x__ pharmacologic strategies (medication options; possible side effects; dose titration instructions)  melatonin for RBD; viagra for ED; anorexia as possible Ritalin side effect     _x__ nonpharmacologic strategies (behavioral strategies; environmental modifications for safety and support)  PT for neck pain    _x__ disease education  (differential diagnosis; diagnostic testing; natural history and prognosis)  Role of DATSCAN; differential diagnosis for parkinsonism; management of orthostatic BP; possible causes of urinary symptoms    ___ supportive counseling   (caregiver burnout; patient perceptions of burden)

## 2014-06-22 ENCOUNTER — Ambulatory Visit: Payer: Self-pay | Admitting: Dermatology

## 2014-06-23 ENCOUNTER — Other Ambulatory Visit: Payer: Self-pay | Admitting: Neurology

## 2014-06-23 NOTE — Progress Notes (Signed)
No auth needed [id number] approved sched 3.11.16 10 and 2 smh

## 2014-07-05 ENCOUNTER — Telehealth: Payer: Self-pay | Admitting: Neurology

## 2014-07-05 ENCOUNTER — Ambulatory Visit
Admit: 2014-07-05 | Discharge: 2014-07-05 | Disposition: A | Discharge status: Home / Self Care | Payer: Self-pay | Source: Ambulatory Visit | Attending: Neurology | Admitting: Neurology

## 2014-07-05 NOTE — Telephone Encounter (Signed)
Patient calling in regard to a brain scan he is having done today. Tryton states he was informed the results should be in either today or tomorrow. He requests to please be contacted back before Monday as he is going out of town. Mishawn can be reached at [phone] to advise.

## 2014-07-06 ENCOUNTER — Encounter: Payer: Self-pay | Admitting: Neurology

## 2014-07-07 ENCOUNTER — Telehealth: Payer: Self-pay | Admitting: Neurology

## 2014-07-07 NOTE — Telephone Encounter (Addendum)
Spoke with [employer]. Reviewed the results of DatScan which are negative for PD. He would like to know before he leaves for vacation on Monday if he should stop taking c/l.   Dr. Lurline Del will call [facility] tomorrow afternoon after she reviews the scan. Pt is aware and asked to be called at [phone] and Dr. Lurline Del has his number.

## 2014-07-07 NOTE — Telephone Encounter (Signed)
Pt called wanting to know the results of his DAT scan. He states that he will be going on vacation this week and wants to know what the results are before he leaves.  Another number that he can be reached at is [phone]

## 2014-09-20 ENCOUNTER — Other Ambulatory Visit: Payer: Self-pay | Admitting: Dermatology

## 2014-09-20 MED ORDER — VALACYCLOVIR HCL 1000 MG PO TABS *I*
ORAL_TABLET | ORAL | Status: AC
Start: 2014-09-20 — End: ?

## 2014-09-20 NOTE — Telephone Encounter (Signed)
Pt c/o shingles flaring on abd and back,under a lot of stress from moving. Offered appt pt declined but wants valtrex.

## 2014-09-26 ENCOUNTER — Ambulatory Visit: Payer: Self-pay | Admitting: Neurology

## 2014-12-26 ENCOUNTER — Other Ambulatory Visit: Payer: Self-pay | Admitting: Pain Medicine

## 2014-12-26 DIAGNOSIS — M545 Low back pain: Secondary | ICD-10-CM

## 2014-12-30 ENCOUNTER — Ambulatory Visit
Admission: RE | Admit: 2014-12-30 | Discharge: 2014-12-30 | Disposition: A | Discharge status: Home / Self Care | Payer: 59 | Source: Ambulatory Visit | Attending: Pain Medicine | Admitting: Pain Medicine

## 2014-12-30 DIAGNOSIS — M545 Low back pain: Secondary | ICD-10-CM

## 2015-01-06 DIAGNOSIS — K219 Gastro-esophageal reflux disease without esophagitis: Secondary | ICD-10-CM

## 2015-01-06 DIAGNOSIS — J309 Allergic rhinitis, unspecified: Secondary | ICD-10-CM

## 2015-01-06 DIAGNOSIS — J45909 Unspecified asthma, uncomplicated: Secondary | ICD-10-CM | POA: Insufficient documentation

## 2015-01-06 DIAGNOSIS — H1010 Acute atopic conjunctivitis, unspecified eye: Secondary | ICD-10-CM

## 2015-01-06 DIAGNOSIS — L4050 Arthropathic psoriasis, unspecified: Secondary | ICD-10-CM

## 2015-01-22 ENCOUNTER — Telehealth: Payer: Self-pay | Admitting: Cardiovascular Disease

## 2015-01-22 NOTE — Telephone Encounter (Signed)
Received referral packet from [facility] in [place], [place] for upcoming appointment with Dr Oval Linsey on 02/07/2015.  Records given to [facility].

## 2015-01-26 ENCOUNTER — Ambulatory Visit (INDEPENDENT_AMBULATORY_CARE_PROVIDER_SITE_OTHER): Payer: 59 | Admitting: Neurology

## 2015-01-26 DIAGNOSIS — J309 Allergic rhinitis, unspecified: Secondary | ICD-10-CM

## 2015-01-26 DIAGNOSIS — H1010 Acute atopic conjunctivitis, unspecified eye: Secondary | ICD-10-CM

## 2015-01-29 ENCOUNTER — Ambulatory Visit (INDEPENDENT_AMBULATORY_CARE_PROVIDER_SITE_OTHER): Payer: 59

## 2015-01-29 DIAGNOSIS — H1010 Acute atopic conjunctivitis, unspecified eye: Secondary | ICD-10-CM | POA: Diagnosis not present

## 2015-01-29 DIAGNOSIS — J309 Allergic rhinitis, unspecified: Secondary | ICD-10-CM

## 2015-02-02 ENCOUNTER — Other Ambulatory Visit: Payer: Self-pay | Admitting: Surgery

## 2015-02-02 ENCOUNTER — Ambulatory Visit (INDEPENDENT_AMBULATORY_CARE_PROVIDER_SITE_OTHER): Payer: 59 | Admitting: Neurology

## 2015-02-02 DIAGNOSIS — J309 Allergic rhinitis, unspecified: Secondary | ICD-10-CM | POA: Diagnosis not present

## 2015-02-05 ENCOUNTER — Other Ambulatory Visit: Payer: Self-pay | Admitting: Allergy and Immunology

## 2015-02-05 MED ORDER — PANTOPRAZOLE SODIUM 40 MG PO TBEC: 40.0000 mg | DELAYED_RELEASE_TABLET | Freq: Two times a day (BID) | ORAL | Status: DC

## 2015-02-05 NOTE — Telephone Encounter (Signed)
Pt's wife called stated that Dr. Neldon Mc wrote an rx for Pantoprazole 40 MG x2 daily. [facility] never received the new rx. Wife states that pt is currently out of medication. Please advise.

## 2015-02-05 NOTE — Telephone Encounter (Signed)
Spoke with patient Arthur Moore and he states that Dr. Neldon Mc did not print out a prescription for the Pantaprazole 40 mg twice daily. He wants a 90 day supply with refills. I told the patient i will see if we can send directly to [facility].

## 2015-02-05 NOTE — Telephone Encounter (Signed)
Spoke with [name] and notified him that the prescription for Pantaprazole 40 mg was sent to [facility].

## 2015-02-06 ENCOUNTER — Ambulatory Visit (INDEPENDENT_AMBULATORY_CARE_PROVIDER_SITE_OTHER): Payer: 59

## 2015-02-06 DIAGNOSIS — J309 Allergic rhinitis, unspecified: Secondary | ICD-10-CM | POA: Diagnosis not present

## 2015-02-06 DIAGNOSIS — H1010 Acute atopic conjunctivitis, unspecified eye: Secondary | ICD-10-CM

## 2015-02-06 NOTE — Progress Notes (Signed)
Cardiology Office Note   Date:  02/08/2015  ID:  Arthur Moore, DOB 1951-01-23, MRN [id number]  PCP:  No primary care provider on file.  Cardiologist:   Sharol Harness, MD   Chief Complaint  Patient presents with  . New Evaluation    had chest pain a couple of months ago, none since then.//establish care, wants a second opinion on ECHO he had from the [place], has copy with him//in immunology therapy      History of Present Illness: Arthur Moore is a 64 y.o. male with asthma, TIA, mild aortic insufficiency, hypertension and hyperlipidemia who presents to establish care.  Arthur Moore recently moved here from [place]. He has been feeling well. He denies any shortness of breath, though he does note that he is out of shape. He hurt his back in May while packing up his old house and moving heavy boxes. Since then he's been under the care of a pain management physician who has been giving him back injections. He is able to walk short distances but has pain after walking for long periods of time. He denies any chest pain or shortness of breath with walking.  Arthur Moore reports frequent palpitations. This is especially prominent at night or when he is at rest. He does not notice them throughout the day or with exertion. He underwent a Holter monitor in March 2015 which revealed sinus rhythm with rare PACs and PVCs there were no significant arrhythmias. His symptoms were similar at that time. He endorses occasional lightheadedness but denies syncope.  Arthur Moore complains of cramping in his calves that he attributes to atorvastatin. He tries to take CoQ 10, which reduces the cramping.  He requests to switch to an alternative statin.  He does not drink alcohol or smoke.  He tries to eat a healthy diet but admits that soda as his weakness.  He is very concerned that his cardiologist in [place] reported that his aortic insufficiency is mild whereas he had an echo at the [place]  recently that reportedly is mild to moderate. He is concerned that he may need to have his valve replaced.   Past Medical History  Diagnosis Date  . Arthritis   . Depression   . Hypertension   . Aortic insufficiency   . Psoriasis   . Cervical disc disease   . Asthma   . Lumbar disc disease     Past Surgical History  Procedure Laterality Date  . Hernia repair    . Cholecystectomy  2012  . Sinus exploration  2013  . Parotidectomy Right 1992  . Prostate surgery  2002, 2004  . Spine surgery  2013    L C7-T1 laminectomy and discectomy     Current Outpatient Prescriptions  Medication Sig Dispense Refill  . aspirin 325 MG tablet Take 325 mg by mouth daily.    . budesonide-formoterol (SYMBICORT) 160-4.5 MCG/ACT inhaler Inhale 2 puffs into the lungs 2 (two) times daily.    . cetirizine (ZYRTEC) 10 MG tablet Take 10 mg by mouth daily.    . Cholecalciferol (VITAMIN D-3 PO) Take by mouth daily.    . Coenzyme Q10 (COQ10 PO) Take 1 tablet by mouth daily.    . Desoximetasone (TOPICORT EX) Apply topically.    . DULoxetine HCl (CYMBALTA PO) Take 120 mg by mouth daily.    Marland Kitchen EPIPEN 2-PAK 0.3 MG/0.3ML SOAJ injection Inject 1 Device as directed as needed.  1  . flunisolide (NASAREL) 29  MCG/ACT (0.025%) nasal spray Place 2 sprays into the nose 2 (two) times daily. Dose is for each nostril.    Marland Kitchen ibuprofen (ADVIL,MOTRIN) 800 MG tablet Take 1 tablet by mouth as needed.    . metoprolol succinate (TOPROL-XL) 25 MG 24 hr tablet Take 25 mg by mouth daily.    . pantoprazole (PROTONIX) 40 MG tablet Take 1 tablet (40 mg total) by mouth 2 (two) times daily. 90 tablet 1  . ranitidine (ZANTAC) 300 MG tablet Take 300 mg by mouth at bedtime.    . rosuvastatin (CRESTOR) 10 MG tablet Take 1 tablet (10 mg total) by mouth daily. 90 tablet 3   No current facility-administered medications for this visit.    Allergies:   Review of patient's allergies indicates no known allergies.    Social History:  The  patient  reports that he has quit smoking. He does not have any smokeless tobacco history on file. He reports that he does not drink alcohol or use illicit drugs.   Family History:  The patient's family history includes Colon cancer in his mother; Lung cancer in his father.    ROS:  Please see the history of present illness.   Otherwise, review of systems are positive for back pain.   All other systems are reviewed and negative.    PHYSICAL EXAM: VS:  BP 132/88 mmHg  Pulse 74  Ht 6' (1.829 m)  Wt 84.233 kg (185 lb 11.2 oz)  BMI 25.18 kg/m2 , BMI Body mass index is 25.18 kg/(m^2). GENERAL:  Well appearing.  Anxious. HEENT:  Pupils equal round and reactive, fundi not visualized, oral mucosa unremarkable NECK:  No jugular venous distention, waveform within normal limits, carotid upstroke brisk and symmetric, no bruits, no thyromegaly LYMPHATICS:  No cervical adenopathy LUNGS:  Clear to auscultation bilaterally HEART:  RRR.  PMI not displaced or sustained,S1 and S2 within normal limits, no S3, no S4, no clicks, no rubs, no murmurs ABD:  Flat, positive bowel sounds normal in frequency in pitch, no bruits, no rebound, no guarding, no midline pulsatile mass, no hepatomegaly, no splenomegaly EXT:  2 plus pulses throughout, no edema, no cyanosis no clubbing SKIN:  No rashes no nodules NEURO:  Cranial nerves II through XII grossly intact, motor grossly intact throughout PSYCH:  Cognitively intact, oriented to person place and time    EKG:  EKG is ordered today. The ekg ordered today demonstrates sinus rhythm 74 bpm.  Echo 04/25/14: LVEF 60%.  Aortic valve sclerosis without stenosis. Mild aortic insufficiency.  ETT 04/2013: Exercise 8 minutes and 44 seconds on a Bruce protocol. Peak heart rate 136. 9.8 METS. No ischemic changes.  24 Hour Holter: Underlying rhythm is sinus. Average heart rate 74, minimum 52, max 123. Rare PACs and PVCs. No significant arrhythmia noted.  Recent Labs: No  results found for requested labs within last 365 days.    Lipid Panel No results found for: CHOL, TRIG, HDL, CHOLHDL, VLDL, LDLCALC, LDLDIRECT  11/2014: Total cholesterol 130, HDL 39, LDL 65, triglycerides 116  Wt Readings from Last 3 Encounters:  02/07/15 84.233 kg (185 lb 11.2 oz)      ASSESSMENT AND PLAN:  # Calf pain: This could be related to his statin, however it also could be related to peripheral vascular disease. We will refer him for screening ABIs. We will also switch his atorvastatin to rosuvastatin 10 mg daily, given that his lipid profile is actually quite good. If he tolerates this dose and his lipids  are inadequately controlled in 3 months, we will increase the dose of rosuvastatin.  # AI: Will review the echo to determine whether his AI is mild or mild-moderate.  I reassured him that either way it does not change the current management we will continue to monitor this regularly. He will need an echo approximately every 3 years.  # Hypertension: BP well-controlled.  Continue metoprolol.  # Hyperlipidemia: Lipids well-controlled.  Switched to rosuvastatin as above.  # TIA: Switch aspirin to 81mg  daily.  Continue statin as above.   Current medicines are reviewed at length with the patient today.  The patient does not have concerns regarding medicines.  The following changes have been made:  Switch atorvastatin to rosuvastatin.  Labs/ tests ordered today include:   Orders Placed This Encounter  Procedures  . Comprehensive metabolic panel  . Lipid panel  . EKG 12-Lead   1 hour of time was spent directly with the patient.   Disposition:   FU with Koreen Lizaola C. Oval Linsey, MD in 6 months   Signed, Sharol Harness, MD  02/08/2015 6:06 AM    [place]

## 2015-02-07 ENCOUNTER — Ambulatory Visit (INDEPENDENT_AMBULATORY_CARE_PROVIDER_SITE_OTHER): Payer: 59 | Admitting: Cardiovascular Disease

## 2015-02-07 VITALS — BP 132/88 | HR 74 | Ht 72.0 in | Wt 185.7 lb

## 2015-02-07 DIAGNOSIS — E785 Hyperlipidemia, unspecified: Secondary | ICD-10-CM | POA: Diagnosis not present

## 2015-02-07 DIAGNOSIS — I10 Essential (primary) hypertension: Secondary | ICD-10-CM

## 2015-02-07 DIAGNOSIS — Z79899 Other long term (current) drug therapy: Secondary | ICD-10-CM

## 2015-02-07 DIAGNOSIS — I739 Peripheral vascular disease, unspecified: Secondary | ICD-10-CM | POA: Diagnosis not present

## 2015-02-07 DIAGNOSIS — I351 Nonrheumatic aortic (valve) insufficiency: Secondary | ICD-10-CM

## 2015-02-07 MED ORDER — ROSUVASTATIN CALCIUM 10 MG PO TABS: 10.0000 mg | ORAL_TABLET | Freq: Every day | ORAL | Status: DC

## 2015-02-07 NOTE — Patient Instructions (Signed)
Your physician has recommended you make the following change in your medication: STOP ATORVASTATIN.  START CRESTOR 10MG  DAILY.  Your physician has requested that you have an ankle brachial index (ABI). During this test an ultrasound and blood pressure cuff are used to evaluate the arteries that supply the arms and legs with blood. Allow thirty minutes for this exam. There are no restrictions or special instructions.  Your physician recommends that you return for lab work in: [place] OF January 2017 AT [facility].  Your physician recommends that you schedule a follow-up appointment in: [place] DR. [place].

## 2015-02-08 ENCOUNTER — Encounter: Payer: Self-pay | Admitting: Cardiovascular Disease

## 2015-02-08 ENCOUNTER — Ambulatory Visit (INDEPENDENT_AMBULATORY_CARE_PROVIDER_SITE_OTHER): Payer: 59 | Admitting: Neurology

## 2015-02-08 DIAGNOSIS — L409 Psoriasis, unspecified: Secondary | ICD-10-CM | POA: Insufficient documentation

## 2015-02-08 DIAGNOSIS — J309 Allergic rhinitis, unspecified: Secondary | ICD-10-CM

## 2015-02-08 DIAGNOSIS — F32A Depression, unspecified: Secondary | ICD-10-CM | POA: Insufficient documentation

## 2015-02-08 DIAGNOSIS — I10 Essential (primary) hypertension: Secondary | ICD-10-CM | POA: Insufficient documentation

## 2015-02-08 DIAGNOSIS — M1990 Unspecified osteoarthritis, unspecified site: Secondary | ICD-10-CM | POA: Insufficient documentation

## 2015-02-08 DIAGNOSIS — M519 Unspecified thoracic, thoracolumbar and lumbosacral intervertebral disc disorder: Secondary | ICD-10-CM | POA: Insufficient documentation

## 2015-02-08 DIAGNOSIS — M5090 Cervical disc disorder, unspecified, unspecified cervical region: Secondary | ICD-10-CM | POA: Insufficient documentation

## 2015-02-08 DIAGNOSIS — I351 Nonrheumatic aortic (valve) insufficiency: Secondary | ICD-10-CM | POA: Insufficient documentation

## 2015-02-08 DIAGNOSIS — F329 Major depressive disorder, single episode, unspecified: Secondary | ICD-10-CM | POA: Insufficient documentation

## 2015-02-14 ENCOUNTER — Ambulatory Visit (INDEPENDENT_AMBULATORY_CARE_PROVIDER_SITE_OTHER): Payer: 59 | Admitting: Neurology

## 2015-02-14 DIAGNOSIS — J309 Allergic rhinitis, unspecified: Secondary | ICD-10-CM | POA: Diagnosis not present

## 2015-02-15 ENCOUNTER — Telehealth: Payer: Self-pay | Admitting: Cardiovascular Disease

## 2015-02-15 NOTE — Telephone Encounter (Signed)
Received from Trixie Dredge, RN for Dr Oval Linsey 2 CD discs of Echocardiogram Report from [facility] to be entered into patient record per Dr Oval Linsey.

## 2015-02-16 ENCOUNTER — Ambulatory Visit (HOSPITAL_COMMUNITY)
Admission: RE | Admit: 2015-02-16 | Discharge: 2015-02-16 | Disposition: A | Discharge status: Home / Self Care | Payer: 59 | Source: Ambulatory Visit | Attending: Cardiovascular Disease | Admitting: Cardiovascular Disease

## 2015-02-16 ENCOUNTER — Inpatient Hospital Stay (HOSPITAL_COMMUNITY): Admission: RE | Admit: 2015-02-16 | Payer: 59 | Source: Ambulatory Visit

## 2015-02-16 ENCOUNTER — Ambulatory Visit (INDEPENDENT_AMBULATORY_CARE_PROVIDER_SITE_OTHER): Payer: 59

## 2015-02-16 DIAGNOSIS — Z87891 Personal history of nicotine dependence: Secondary | ICD-10-CM | POA: Diagnosis not present

## 2015-02-16 DIAGNOSIS — I10 Essential (primary) hypertension: Secondary | ICD-10-CM | POA: Insufficient documentation

## 2015-02-16 DIAGNOSIS — E785 Hyperlipidemia, unspecified: Secondary | ICD-10-CM | POA: Insufficient documentation

## 2015-02-16 DIAGNOSIS — I739 Peripheral vascular disease, unspecified: Secondary | ICD-10-CM | POA: Insufficient documentation

## 2015-02-16 DIAGNOSIS — J301 Allergic rhinitis due to pollen: Secondary | ICD-10-CM

## 2015-02-20 ENCOUNTER — Ambulatory Visit (INDEPENDENT_AMBULATORY_CARE_PROVIDER_SITE_OTHER): Payer: 59

## 2015-02-20 ENCOUNTER — Telehealth: Payer: Self-pay | Admitting: *Deleted

## 2015-02-20 ENCOUNTER — Ambulatory Visit: Payer: Self-pay | Admitting: Allergy and Immunology

## 2015-02-20 DIAGNOSIS — J309 Allergic rhinitis, unspecified: Secondary | ICD-10-CM

## 2015-02-20 NOTE — Telephone Encounter (Signed)
-----   Message from Skeet Latch, MD sent at 02/19/2015  2:39 PM EDT ----- Normal ABIs.  Symptoms are not due to inadequate blood flow to the legs.

## 2015-02-20 NOTE — Telephone Encounter (Signed)
Spoke to patient. Result given . Verbalized understanding Information given about echo 01/16/15- no changes form what report states per Dr Oval Linsey

## 2015-02-20 NOTE — Telephone Encounter (Signed)
Left message to call back  

## 2015-02-22 ENCOUNTER — Ambulatory Visit (INDEPENDENT_AMBULATORY_CARE_PROVIDER_SITE_OTHER): Payer: 59 | Admitting: Neurology

## 2015-02-22 DIAGNOSIS — J309 Allergic rhinitis, unspecified: Secondary | ICD-10-CM | POA: Diagnosis not present

## 2015-02-27 ENCOUNTER — Ambulatory Visit (INDEPENDENT_AMBULATORY_CARE_PROVIDER_SITE_OTHER): Payer: 59

## 2015-02-27 DIAGNOSIS — J309 Allergic rhinitis, unspecified: Secondary | ICD-10-CM

## 2015-03-01 ENCOUNTER — Ambulatory Visit (INDEPENDENT_AMBULATORY_CARE_PROVIDER_SITE_OTHER): Payer: 59

## 2015-03-01 DIAGNOSIS — J309 Allergic rhinitis, unspecified: Secondary | ICD-10-CM | POA: Diagnosis not present

## 2015-03-05 ENCOUNTER — Ambulatory Visit (INDEPENDENT_AMBULATORY_CARE_PROVIDER_SITE_OTHER): Payer: 59

## 2015-03-05 DIAGNOSIS — J309 Allergic rhinitis, unspecified: Secondary | ICD-10-CM

## 2015-03-08 ENCOUNTER — Encounter (INDEPENDENT_AMBULATORY_CARE_PROVIDER_SITE_OTHER): Payer: 59

## 2015-03-08 DIAGNOSIS — J309 Allergic rhinitis, unspecified: Secondary | ICD-10-CM

## 2015-03-08 NOTE — Progress Notes (Signed)
This encounter was created in error - please disregard.

## 2015-03-09 ENCOUNTER — Ambulatory Visit (INDEPENDENT_AMBULATORY_CARE_PROVIDER_SITE_OTHER): Payer: 59 | Admitting: *Deleted

## 2015-03-09 DIAGNOSIS — J309 Allergic rhinitis, unspecified: Secondary | ICD-10-CM | POA: Diagnosis not present

## 2015-03-21 ENCOUNTER — Ambulatory Visit (INDEPENDENT_AMBULATORY_CARE_PROVIDER_SITE_OTHER): Payer: 59

## 2015-03-21 ENCOUNTER — Ambulatory Visit: Payer: 59 | Admitting: Allergy and Immunology

## 2015-03-21 DIAGNOSIS — J309 Allergic rhinitis, unspecified: Secondary | ICD-10-CM

## 2015-03-29 ENCOUNTER — Ambulatory Visit (INDEPENDENT_AMBULATORY_CARE_PROVIDER_SITE_OTHER): Payer: 59

## 2015-03-29 DIAGNOSIS — J309 Allergic rhinitis, unspecified: Secondary | ICD-10-CM

## 2015-04-06 ENCOUNTER — Ambulatory Visit (INDEPENDENT_AMBULATORY_CARE_PROVIDER_SITE_OTHER): Payer: 59 | Admitting: *Deleted

## 2015-04-06 DIAGNOSIS — J309 Allergic rhinitis, unspecified: Secondary | ICD-10-CM

## 2015-04-11 ENCOUNTER — Ambulatory Visit (INDEPENDENT_AMBULATORY_CARE_PROVIDER_SITE_OTHER): Payer: 59

## 2015-04-11 DIAGNOSIS — J309 Allergic rhinitis, unspecified: Secondary | ICD-10-CM | POA: Diagnosis not present

## 2015-04-16 ENCOUNTER — Ambulatory Visit (INDEPENDENT_AMBULATORY_CARE_PROVIDER_SITE_OTHER): Payer: 59 | Admitting: Neurology

## 2015-04-16 DIAGNOSIS — J309 Allergic rhinitis, unspecified: Secondary | ICD-10-CM

## 2015-04-25 ENCOUNTER — Ambulatory Visit (INDEPENDENT_AMBULATORY_CARE_PROVIDER_SITE_OTHER): Payer: 59

## 2015-04-25 DIAGNOSIS — J309 Allergic rhinitis, unspecified: Secondary | ICD-10-CM | POA: Diagnosis not present

## 2015-05-02 ENCOUNTER — Ambulatory Visit (INDEPENDENT_AMBULATORY_CARE_PROVIDER_SITE_OTHER): Payer: 59

## 2015-05-02 DIAGNOSIS — J309 Allergic rhinitis, unspecified: Secondary | ICD-10-CM

## 2015-05-03 DIAGNOSIS — J3089 Other allergic rhinitis: Secondary | ICD-10-CM | POA: Diagnosis not present

## 2015-05-04 DIAGNOSIS — J301 Allergic rhinitis due to pollen: Secondary | ICD-10-CM | POA: Diagnosis not present

## 2015-05-15 ENCOUNTER — Ambulatory Visit (INDEPENDENT_AMBULATORY_CARE_PROVIDER_SITE_OTHER): Payer: 59

## 2015-05-15 DIAGNOSIS — J309 Allergic rhinitis, unspecified: Secondary | ICD-10-CM | POA: Diagnosis not present

## 2015-05-17 ENCOUNTER — Telehealth: Payer: Self-pay | Admitting: Allergy and Immunology

## 2015-05-17 NOTE — Telephone Encounter (Signed)
Last bill was paid by check 1799 $11.87 on 04/03/2015. Had received no other bills but received a past due account notice on 05/11/2015 saying that it was due by 1?15/2017 or would be sent to collections. Wife has qustions about this letter and balance.

## 2015-05-28 ENCOUNTER — Ambulatory Visit (INDEPENDENT_AMBULATORY_CARE_PROVIDER_SITE_OTHER): Payer: 59

## 2015-05-28 DIAGNOSIS — J309 Allergic rhinitis, unspecified: Secondary | ICD-10-CM

## 2015-06-06 ENCOUNTER — Emergency Department (HOSPITAL_COMMUNITY)
Admission: EM | Admit: 2015-06-06 | Discharge: 2015-06-06 | Disposition: A | Discharge status: Home / Self Care | Payer: 59 | Attending: Emergency Medicine | Admitting: Emergency Medicine

## 2015-06-06 ENCOUNTER — Encounter (HOSPITAL_COMMUNITY): Payer: Self-pay

## 2015-06-06 DIAGNOSIS — J45909 Unspecified asthma, uncomplicated: Secondary | ICD-10-CM | POA: Diagnosis not present

## 2015-06-06 DIAGNOSIS — Z7952 Long term (current) use of systemic steroids: Secondary | ICD-10-CM | POA: Diagnosis not present

## 2015-06-06 DIAGNOSIS — R1030 Lower abdominal pain, unspecified: Secondary | ICD-10-CM | POA: Insufficient documentation

## 2015-06-06 DIAGNOSIS — I10 Essential (primary) hypertension: Secondary | ICD-10-CM | POA: Insufficient documentation

## 2015-06-06 DIAGNOSIS — F329 Major depressive disorder, single episode, unspecified: Secondary | ICD-10-CM | POA: Diagnosis not present

## 2015-06-06 DIAGNOSIS — Z79899 Other long term (current) drug therapy: Secondary | ICD-10-CM | POA: Diagnosis not present

## 2015-06-06 DIAGNOSIS — G8929 Other chronic pain: Secondary | ICD-10-CM | POA: Diagnosis not present

## 2015-06-06 DIAGNOSIS — M1990 Unspecified osteoarthritis, unspecified site: Secondary | ICD-10-CM | POA: Diagnosis not present

## 2015-06-06 DIAGNOSIS — Z7951 Long term (current) use of inhaled steroids: Secondary | ICD-10-CM | POA: Insufficient documentation

## 2015-06-06 DIAGNOSIS — Z872 Personal history of diseases of the skin and subcutaneous tissue: Secondary | ICD-10-CM | POA: Diagnosis not present

## 2015-06-06 DIAGNOSIS — R011 Cardiac murmur, unspecified: Secondary | ICD-10-CM | POA: Diagnosis not present

## 2015-06-06 DIAGNOSIS — Z792 Long term (current) use of antibiotics: Secondary | ICD-10-CM | POA: Insufficient documentation

## 2015-06-06 DIAGNOSIS — Z87440 Personal history of urinary (tract) infections: Secondary | ICD-10-CM | POA: Diagnosis not present

## 2015-06-06 DIAGNOSIS — Z87891 Personal history of nicotine dependence: Secondary | ICD-10-CM | POA: Diagnosis not present

## 2015-06-06 DIAGNOSIS — Z7982 Long term (current) use of aspirin: Secondary | ICD-10-CM | POA: Diagnosis not present

## 2015-06-06 DIAGNOSIS — R55 Syncope and collapse: Secondary | ICD-10-CM | POA: Diagnosis not present

## 2015-06-06 LAB — CBC
HCT: 42.7 % (ref 39.0–52.0)
Hemoglobin: 14.7 g/dL (ref 13.0–17.0)
MCH: 29.9 pg (ref 26.0–34.0)
MCHC: 34.4 g/dL (ref 30.0–36.0)
MCV: 87 fL (ref 78.0–100.0)
Platelets: 175 10*3/uL (ref 150–400)
RBC: 4.91 MIL/uL (ref 4.22–5.81)
RDW: 13.1 % (ref 11.5–15.5)
WBC: 7.7 10*3/uL (ref 4.0–10.5)

## 2015-06-06 LAB — BASIC METABOLIC PANEL
Anion gap: 12 (ref 5–15)
BUN: 9 mg/dL (ref 6–20)
CO2: 21 mmol/L — ABNORMAL LOW (ref 22–32)
Calcium: 9.4 mg/dL (ref 8.9–10.3)
Chloride: 106 mmol/L (ref 101–111)
Creatinine, Ser: 1.26 mg/dL — ABNORMAL HIGH (ref 0.61–1.24)
GFR calc Af Amer: >60 mL/min (ref >60)
GFR calc non Af Amer: 59 mL/min — ABNORMAL LOW (ref >60)
Glucose, Bld: 94 mg/dL (ref 65–99)
Potassium: 4.2 mmol/L (ref 3.5–5.1)
Sodium: 139 mmol/L (ref 135–145)

## 2015-06-06 LAB — I-STAT TROPONIN, ED: Troponin i, poc: 0.01 ng/mL (ref 0.00–0.08)

## 2015-06-06 LAB — CBG MONITORING, ED: Glucose-Capillary: 88 mg/dL (ref 65–99)

## 2015-06-06 NOTE — ED Provider Notes (Signed)
CSN: [id number]     Arrival date & time 06/06/15  1458 History   First MD Initiated Contact with Patient 06/06/15 1513     Chief Complaint  Patient presents with  . Allergic Reaction     (Consider location/radiation/quality/duration/timing/severity/associated sxs/prior Treatment) Patient is a 65 y.o. male presenting with near-syncope. The history is provided by the patient.  Near Syncope This is a recurrent problem. The current episode started in the past 7 days. The problem occurs intermittently. The problem has been unchanged. Associated symptoms include abdominal pain (chronic). Pertinent negatives include no congestion, coughing, fever or weakness. Nothing aggravates the symptoms. He has tried nothing for the symptoms. The treatment provided no relief.    Past Medical History  Diagnosis Date  . Arthritis   . Depression   . Hypertension   . Aortic insufficiency   . Psoriasis   . Cervical disc disease   . Asthma   . Lumbar disc disease    Past Surgical History  Procedure Laterality Date  . Hernia repair    . Cholecystectomy  2012  . Sinus exploration  2013  . Parotidectomy Right 1992  . Prostate surgery  2002, 2004  . Spine surgery  2013    L C7-T1 laminectomy and discectomy   Family History  Problem Relation Age of Onset  . Colon cancer Mother   . Lung cancer Father    Social History  Substance Use Topics  . Smoking status: Former Research scientist (life sciences)  . Smokeless tobacco: None  . Alcohol Use: No    Review of Systems  Constitutional: Negative for fever.  HENT: Negative for congestion.   Eyes: Negative.   Respiratory: Negative for cough.   Cardiovascular: Positive for near-syncope.  Gastrointestinal: Positive for abdominal pain (chronic).  Genitourinary: Negative.   Musculoskeletal: Negative.   Skin: Negative.   Neurological: Negative for weakness.  Psychiatric/Behavioral: Negative.       Allergies  Review of patient's allergies indicates no known  allergies.  Home Medications   Prior to Admission medications   Medication Sig Start Date End Date Taking? Authorizing Provider  aspirin 325 MG tablet Take 325 mg by mouth daily.   Yes Historical Provider, MD  budesonide-formoterol (SYMBICORT) 160-4.5 MCG/ACT inhaler Inhale 2 puffs into the lungs 2 (two) times daily.   Yes Historical Provider, MD  cetirizine (ZYRTEC) 10 MG tablet Take 10 mg by mouth daily.   Yes Historical Provider, MD  Cholecalciferol (VITAMIN D-3 PO) Take by mouth daily.   Yes Historical Provider, MD  DULoxetine HCl (CYMBALTA PO) Take 120 mg by mouth daily.   Yes Historical Provider, MD  lurasidone (LATUDA) 20 MG TABS tablet Take 20 mg by mouth daily.   Yes Historical Provider, MD  metoprolol succinate (TOPROL-XL) 25 MG 24 hr tablet Take 25 mg by mouth every other day.    Yes Historical Provider, MD  pantoprazole (PROTONIX) 40 MG tablet Take 1 tablet (40 mg total) by mouth 2 (two) times daily. Patient taking differently: Take 40 mg by mouth daily.  02/05/15  Yes Jiles Prows, MD  rosuvastatin (CRESTOR) 10 MG tablet Take 1 tablet (10 mg total) by mouth daily. 02/07/15  Yes Skeet Latch, MD  sulfamethoxazole-trimethoprim (BACTRIM DS,SEPTRA DS) 800-160 MG tablet Take 1 tablet by mouth 2 (two) times daily.   Yes Historical Provider, MD  Coenzyme Q10 (COQ10 PO) Take 1 tablet by mouth daily.    Historical Provider, MD  Desoximetasone (TOPICORT EX) Apply topically.    Historical Provider, MD  EPIPEN 2-PAK 0.3 MG/0.3ML SOAJ injection Inject 1 Device as directed as needed. 12/27/14   Historical Provider, MD  flunisolide (NASAREL) 29 MCG/ACT (0.025%) nasal spray Place 2 sprays into the nose 2 (two) times daily. Dose is for each nostril.    Historical Provider, MD  ibuprofen (ADVIL,MOTRIN) 800 MG tablet Take 1 tablet by mouth as needed. 01/31/15   Historical Provider, MD  ranitidine (ZANTAC) 300 MG tablet Take 300 mg by mouth at bedtime.    Historical Provider, MD   BP 116/69 mmHg   Pulse 91  Temp(Src) 98.9 F (37.2 C) (Oral)  Resp 13  SpO2 94% Physical Exam  Constitutional: He is oriented to person, place, and time. He appears well-developed and well-nourished. No distress.  HENT:  Head: Normocephalic and atraumatic.  Eyes: EOM are normal. Pupils are equal, round, and reactive to light.  Neck: Normal range of motion. Neck supple.  Cardiovascular: Normal rate, regular rhythm and intact distal pulses.   Murmur (grade 1 best heard at RUSB) heard. Pulmonary/Chest: Effort normal and breath sounds normal. He exhibits no tenderness.  Abdominal: Soft. He exhibits no distension. There is tenderness (suprapubic that he states is chronic from his recurrent UTI). There is no rebound and no guarding.  Musculoskeletal: Normal range of motion. He exhibits no edema or tenderness.  Neurological: He is alert and oriented to person, place, and time. No cranial nerve deficit. He exhibits normal muscle tone. Coordination normal.  Skin: Skin is warm and dry. No rash noted. He is not diaphoretic. No erythema. No pallor.  Psychiatric: He has a normal mood and affect.  Nursing note and vitals reviewed.   ED Course  Procedures (including critical care time) Labs Review Labs Reviewed  BASIC METABOLIC PANEL - Abnormal; Notable for the following:    CO2 21 (*)    Creatinine, Ser 1.26 (*)    GFR calc non Af Amer 59 (*)    All other components within normal limits  CBC  CBG MONITORING, ED  I-STAT TROPOININ, ED    Imaging Review No results found. I have personally reviewed and evaluated these images and lab results as part of my medical decision-making.   EKG Interpretation None      MDM   Final diagnoses:  Near syncope    Patient is a 65 year old male sent over from outside provider for concerns of an allergic reaction to contrast during CT scan. He states that after he received contrast he had an episode of presyncopal symptoms, but otherwise he denies any nausea,  vomiting, rash, diarrhea, respiratory symptoms. Upon further history he has had these presyncopal episodes and jitteriness as well as palpitations ever since starting Lurasidone on Friday. Was getting a CT scan done today to evaluate his GU system for recurrent UTIs/enlarged prostate and he has received contrast in the past without problems. Further history and exam as above notable for stable vital signs and reassuring physical exam. Doubt allergic reaction to contrast. Concern for possible intolerance to lurasidone that he was started on. No infective symptoms at this time. CGB, CBC, and trop neg. ekg without acute ischemic changes or arrhythmias. Noted to have a cr of 1.26.  I have reviewed all labs and ekgs. Patient stable for discharge home.  I have reviewed all results with the patient. Advised to f/u with his PCP within 1 week for further evaluation of kidney function. Pt states he will stop taking his lurasidone. Patient agrees to stated plan. All questions answered. Advised to call or  return to have any questions, new symptoms, change in symptoms, or symptoms that they do not understand.      Heriberto Antigua, MD 06/07/15 [id number]  Gareth Morgan, MD 06/08/15 [id number]  Gareth Morgan, MD 06/08/15 1900

## 2015-06-06 NOTE — ED Notes (Signed)
MD at bedside. Pt. Stating he is going to leave prior to D/C paperwork. Pt. Ambulated to exit with steady gait, wife with pt.

## 2015-06-06 NOTE — Discharge Instructions (Signed)

## 2015-06-06 NOTE — ED Notes (Signed)
Pt. Requesting to leave. States he feels better and needs to go home. MD made aware and will come see pt. Pt. IV removed by this RN.

## 2015-06-06 NOTE — ED Notes (Signed)
Pt BIB EMS from [facility] in [place]. Pt. Has recurrent UTI he is being txt for, seen today for CT scan related to bladder complaint. Pt. Received IV contrast and had period of diaphoresis, dizziness. Pt. CBG initially 60, given D50 CBG now 127.

## 2015-06-07 ENCOUNTER — Ambulatory Visit (INDEPENDENT_AMBULATORY_CARE_PROVIDER_SITE_OTHER): Payer: 59

## 2015-06-07 DIAGNOSIS — J309 Allergic rhinitis, unspecified: Secondary | ICD-10-CM

## 2015-06-12 ENCOUNTER — Ambulatory Visit (INDEPENDENT_AMBULATORY_CARE_PROVIDER_SITE_OTHER): Payer: 59

## 2015-06-12 DIAGNOSIS — J309 Allergic rhinitis, unspecified: Secondary | ICD-10-CM

## 2015-06-20 ENCOUNTER — Ambulatory Visit (INDEPENDENT_AMBULATORY_CARE_PROVIDER_SITE_OTHER): Payer: 59

## 2015-06-20 DIAGNOSIS — J309 Allergic rhinitis, unspecified: Secondary | ICD-10-CM

## 2015-06-26 ENCOUNTER — Ambulatory Visit (INDEPENDENT_AMBULATORY_CARE_PROVIDER_SITE_OTHER): Payer: 59

## 2015-06-26 DIAGNOSIS — J309 Allergic rhinitis, unspecified: Secondary | ICD-10-CM

## 2015-07-03 ENCOUNTER — Ambulatory Visit (INDEPENDENT_AMBULATORY_CARE_PROVIDER_SITE_OTHER): Payer: 59

## 2015-07-03 DIAGNOSIS — J309 Allergic rhinitis, unspecified: Secondary | ICD-10-CM

## 2015-07-10 ENCOUNTER — Ambulatory Visit (INDEPENDENT_AMBULATORY_CARE_PROVIDER_SITE_OTHER): Payer: 59 | Admitting: *Deleted

## 2015-07-10 DIAGNOSIS — J309 Allergic rhinitis, unspecified: Secondary | ICD-10-CM | POA: Diagnosis not present

## 2015-07-25 ENCOUNTER — Telehealth: Payer: Self-pay | Admitting: Allergy and Immunology

## 2015-07-25 NOTE — Telephone Encounter (Signed)
WILL PAY HALF THIS WEEK & HALF NEXT MONTH

## 2015-07-25 NOTE — Telephone Encounter (Signed)
Patient wife received bill that they weren't expecting and has a question

## 2015-07-31 ENCOUNTER — Telehealth: Payer: Self-pay | Admitting: Allergy and Immunology

## 2015-07-31 ENCOUNTER — Ambulatory Visit (INDEPENDENT_AMBULATORY_CARE_PROVIDER_SITE_OTHER): Payer: 59

## 2015-07-31 DIAGNOSIS — J309 Allergic rhinitis, unspecified: Secondary | ICD-10-CM

## 2015-07-31 NOTE — Telephone Encounter (Signed)
He received a bill for two charges for $360 each. He wants to know what these charges are for.

## 2015-08-02 NOTE — Telephone Encounter (Signed)
50% DISC ON SECOND VIAL PER DR K

## 2015-08-07 ENCOUNTER — Ambulatory Visit (INDEPENDENT_AMBULATORY_CARE_PROVIDER_SITE_OTHER): Payer: 59 | Admitting: *Deleted

## 2015-08-07 DIAGNOSIS — J309 Allergic rhinitis, unspecified: Secondary | ICD-10-CM | POA: Diagnosis not present

## 2015-08-08 ENCOUNTER — Other Ambulatory Visit: Payer: Self-pay | Admitting: *Deleted

## 2015-08-08 ENCOUNTER — Telehealth: Payer: Self-pay

## 2015-08-08 MED ORDER — PANTOPRAZOLE SODIUM 40 MG PO TBEC
40.0000 mg | DELAYED_RELEASE_TABLET | Freq: Two times a day (BID) | ORAL | Status: DC
Start: 2015-08-08 — End: 2015-10-19

## 2015-08-08 NOTE — Telephone Encounter (Signed)
Pt of [place] Would like a refill of Pantoprazole Sod.   [facility] 90 Day Supply DOL VISIT

## 2015-08-08 NOTE — Telephone Encounter (Signed)
Rx sent pt advised

## 2015-08-14 ENCOUNTER — Ambulatory Visit (INDEPENDENT_AMBULATORY_CARE_PROVIDER_SITE_OTHER): Payer: 59

## 2015-08-14 DIAGNOSIS — J309 Allergic rhinitis, unspecified: Secondary | ICD-10-CM | POA: Diagnosis not present

## 2015-08-20 ENCOUNTER — Ambulatory Visit (INDEPENDENT_AMBULATORY_CARE_PROVIDER_SITE_OTHER): Payer: 59

## 2015-08-20 DIAGNOSIS — J309 Allergic rhinitis, unspecified: Secondary | ICD-10-CM

## 2015-08-24 ENCOUNTER — Telehealth: Payer: Self-pay | Admitting: *Deleted

## 2015-08-24 ENCOUNTER — Ambulatory Visit (INDEPENDENT_AMBULATORY_CARE_PROVIDER_SITE_OTHER): Payer: 59 | Admitting: Cardiovascular Disease

## 2015-08-24 ENCOUNTER — Encounter: Payer: Self-pay | Admitting: Cardiovascular Disease

## 2015-08-24 VITALS — BP 118/74 | HR 64 | Ht 71.0 in | Wt 192.0 lb

## 2015-08-24 DIAGNOSIS — I10 Essential (primary) hypertension: Secondary | ICD-10-CM

## 2015-08-24 DIAGNOSIS — E785 Hyperlipidemia, unspecified: Secondary | ICD-10-CM | POA: Insufficient documentation

## 2015-08-24 DIAGNOSIS — I351 Nonrheumatic aortic (valve) insufficiency: Secondary | ICD-10-CM

## 2015-08-24 DIAGNOSIS — G459 Transient cerebral ischemic attack, unspecified: Secondary | ICD-10-CM | POA: Diagnosis not present

## 2015-08-24 HISTORY — DX: Hyperlipidemia, unspecified: E78.5

## 2015-08-24 NOTE — Progress Notes (Signed)
Cardiology Office Note   Date:  08/24/2015  ID:  Arthur Moore, DOB 1950-05-07, MRN [id number]  PCP:  Gerrit Heck, MD  Cardiologist:   Skeet Latch, MD   Chief Complaint  Patient presents with  . Follow-up    6 MONTH OV      Patient ID: Arthur Moore is a 65 y.o. male with asthma, TIA, mild aortic insufficiency, hypertension and hyperlipidemia who presents for follow up.  Interval History 08/24/15: After his last appointment Arthur Moore had lower extremity Dopplers and ABIs that revealed normal blood flow.   Atorvastatin was switched to rosuvastatin due to cramping.  Follow-up lipids were ordered but have not been obtained.  He reports that he has been doing well.  He no longer has cramping. He takes the rosuvastatin intermittently. He also takes metoprolol intermittently, as he does not think he truly has hypertension. He was diagnosed with high blood pressure several years ago while staying with a friend who is on hospice. The time his blood pressure was in the [id number] systolic. He was previously on lisinopril but no longer takes that medication. He didn't take the metoprolol today. He notes that on days when he does not take his blood pressure is in the 140s to 150s.  At this point in the interview a fire off in the office. Arthur Moore stated that if he left the building he was not coming back.  He also stated that, "my time is important. I want a refund."  History of Present Illness 02/07/15:   Arthur Moore recently moved here from [place]. He has been feeling well. He denies any shortness of breath, though he does note that he is out of shape. He hurt his back in May while packing up his old house and moving heavy boxes. Since then he's been under the care of a pain management physician who has been giving him back injections. He is able to walk short distances but has pain after walking for long periods of time. He denies any chest pain or shortness  of breath with walking.  Arthur Moore reports frequent palpitations. This is especially prominent at night or when he is at rest. He does not notice them throughout the day or with exertion. He underwent a Holter monitor in March 2015 which revealed sinus rhythm with rare PACs and PVCs there were no significant arrhythmias. His symptoms were similar at that time. He endorses occasional lightheadedness but denies syncope.  Arthur Moore complains of cramping in his calves that he attributes to atorvastatin. He tries to take CoQ 10, which reduces the cramping.  He requests to switch to an alternative statin.  He does not drink alcohol or smoke.  He tries to eat a healthy diet but admits that soda as his weakness.  He is very concerned that his cardiologist in [place] reported that his aortic insufficiency is mild whereas he had an echo at the [place] recently that reportedly is mild to moderate. He is concerned that he may need to have his valve replaced.   Past Medical History  Diagnosis Date  . Arthritis   . Depression   . Hypertension   . Aortic insufficiency   . Psoriasis   . Cervical disc disease   . Asthma   . Lumbar disc disease   . Hyperlipidemia 08/24/2015    Past Surgical History  Procedure Laterality Date  . Hernia repair    . Cholecystectomy  2012  . Sinus exploration  2013  . Parotidectomy Right 1992  . Prostate surgery  2002, 2004  . Spine surgery  2013    L C7-T1 laminectomy and discectomy     Current Outpatient Prescriptions  Medication Sig Dispense Refill  . budesonide-formoterol (SYMBICORT) 160-4.5 MCG/ACT inhaler Inhale 2 puffs into the lungs 2 (two) times daily.    . cetirizine (ZYRTEC) 10 MG tablet Take 10 mg by mouth daily.    . Cholecalciferol (VITAMIN D-3 PO) Take by mouth daily.    . Coenzyme Q10 (COQ10 PO) Take 1 tablet by mouth daily.    . Desoximetasone (TOPICORT EX) Apply topically.    . DULoxetine HCl (CYMBALTA PO) Take 120 mg by mouth daily.    Marland Kitchen  EPIPEN 2-PAK 0.3 MG/0.3ML SOAJ injection Inject 1 Device as directed as needed.  1  . flunisolide (NASAREL) 29 MCG/ACT (0.025%) nasal spray Place 2 sprays into the nose 2 (two) times daily. Dose is for each nostril.    Marland Kitchen ibuprofen (ADVIL,MOTRIN) 800 MG tablet Take 1 tablet by mouth as needed.    . lurasidone (LATUDA) 20 MG TABS tablet Take 20 mg by mouth daily.    . metoprolol succinate (TOPROL-XL) 25 MG 24 hr tablet Take 25 mg by mouth every other day.     . pantoprazole (PROTONIX) 40 MG tablet Take 1 tablet (40 mg total) by mouth 2 (two) times daily. 90 tablet 0  . ranitidine (ZANTAC) 300 MG tablet Take 300 mg by mouth at bedtime.    . rosuvastatin (CRESTOR) 10 MG tablet Take 1 tablet (10 mg total) by mouth daily. 90 tablet 3  . sulfamethoxazole-trimethoprim (BACTRIM DS,SEPTRA DS) 800-160 MG tablet Take 1 tablet by mouth 2 (two) times daily.     No current facility-administered medications for this visit.    Allergies:   Atorvastatin    Social History:  The patient  reports that he has quit smoking. He does not have any smokeless tobacco history on file. He reports that he does not drink alcohol or use illicit drugs.   Family History:  The patient's family history includes Colon cancer in his mother; Lung cancer in his father.    ROS:  Please see the history of present illness.   Otherwise, review of systems are positive for back pain.   All other systems are reviewed and negative.    PHYSICAL EXAM: VS:  BP 118/74 mmHg  Pulse 64  Ht 5\' 11"  (1.803 m)  Wt 87.091 kg (192 lb)  BMI 26.79 kg/m2 , BMI Body mass index is 26.79 kg/(m^2).  Exam not obtain. Patient left the building and did not return.    EKG:  EKG is not ordered today.  Echo 04/25/14: LVEF 60%.  Aortic valve sclerosis without stenosis. Mild aortic insufficiency.  ETT 04/2013:  Exercise 8 minutes and 44 seconds on a Bruce protocol. Peak heart rate 136. 9.8 METS. No ischemic changes.  24 Hour Holter: Underlying rhythm  is sinus. Average heart rate 74, minimum 52, max 123. Rare PACs and PVCs. No significant arrhythmia noted.  Recent Labs: 06/06/2015: BUN 9; Creatinine, Ser 1.26*; Hemoglobin 14.7; Platelets 175; Potassium 4.2; Sodium 139    Lipid Panel No results found for: CHOL, TRIG, HDL, CHOLHDL, VLDL, LDLCALC, LDLDIRECT  11/2014: Total cholesterol 130, HDL 39, LDL 65, triglycerides 116  Wt Readings from Last 3 Encounters:  08/24/15 87.091 kg (192 lb)  02/07/15 84.233 kg (185 lb 11.2 oz)      ASSESSMENT AND PLAN:  # Calf pain:  #  Hyperlipidemia: Calf pain has resolved. He is tolerating rosuvastatin. It is recommended that he have his lipids and LFTs repeated to determine if the dose needs to be increased.   Aortic regurgitation: Outside echo was reviewed and he does have mild to moderate aortic insufficiency. Echo will need to be repeated in 3 years. He does not have any symptoms of heart failure.    # Hypertension: BP well-controlled.  he has been taking metoprolol intermittently. I recommended that he take it every day to avoid headaches and poorly controlled hypertension on the days that he does not take it.   # TIA: Continue aspirin and statin.    Current medicines are reviewed at length with the patient today.  The patient does not have concerns regarding medicines.  The following changes have been made: None  Labs/ tests ordered today include:   No orders of the defined types were placed in this encounter.    Disposition:   FU with Lenell Mcconnell C. Oval Linsey, MD in 6 months   Signed, Skeet Latch, MD  08/24/2015 3:38 PM    [place]

## 2015-08-24 NOTE — Telephone Encounter (Signed)
This patient was in Friday 08/24/15 to see Dr. Oval Linsey.  During the time Dr. Oval Linsey was in the exam room with the patient, a fire drill took place.  The building was evacuated and the patient was taken outside the building along with the other patients.  The evacuation lasted roughly 5 minutes at which time tenants/patients and staff were allowed to reenter the building.  The patient chose not to return and complete his visit with Dr. Oval Linsey.  The patient called and spoke with Rayna Sexton in the accounting department here at [facility] and stated he wanted a refund of his $20.00 co pay and did not want to be charged for the brief time he was in the exam room with Dr. Oval Linsey.  I spoke with Dr. Oval Linsey and she stated he would be charged a lesser visit due to what transpired concerning the fire drill, but she would not enter a "No Charge" for this patient.  She stated she spent at least 15 minutes with the patient and she understood he was rude and ugly the front desk staff when he checked in.   Refund was not done for the co payment of $20.00.  Refunding the co pay of 20.00 would result in the patient receiving a bill.   We will probably end up writing off the amount of his visit.

## 2015-08-28 ENCOUNTER — Ambulatory Visit (INDEPENDENT_AMBULATORY_CARE_PROVIDER_SITE_OTHER): Payer: 59

## 2015-08-28 DIAGNOSIS — J309 Allergic rhinitis, unspecified: Secondary | ICD-10-CM

## 2015-08-30 DIAGNOSIS — J3089 Other allergic rhinitis: Secondary | ICD-10-CM | POA: Diagnosis not present

## 2015-08-31 DIAGNOSIS — J301 Allergic rhinitis due to pollen: Secondary | ICD-10-CM | POA: Diagnosis not present

## 2015-09-04 ENCOUNTER — Ambulatory Visit (INDEPENDENT_AMBULATORY_CARE_PROVIDER_SITE_OTHER): Payer: 59

## 2015-09-04 DIAGNOSIS — J309 Allergic rhinitis, unspecified: Secondary | ICD-10-CM

## 2015-09-10 ENCOUNTER — Ambulatory Visit (INDEPENDENT_AMBULATORY_CARE_PROVIDER_SITE_OTHER): Payer: 59 | Admitting: *Deleted

## 2015-09-10 DIAGNOSIS — J309 Allergic rhinitis, unspecified: Secondary | ICD-10-CM | POA: Diagnosis not present

## 2015-09-17 ENCOUNTER — Ambulatory Visit (INDEPENDENT_AMBULATORY_CARE_PROVIDER_SITE_OTHER): Payer: 59

## 2015-09-17 DIAGNOSIS — J309 Allergic rhinitis, unspecified: Secondary | ICD-10-CM

## 2015-09-25 ENCOUNTER — Ambulatory Visit (INDEPENDENT_AMBULATORY_CARE_PROVIDER_SITE_OTHER): Payer: 59

## 2015-09-25 DIAGNOSIS — J309 Allergic rhinitis, unspecified: Secondary | ICD-10-CM | POA: Diagnosis not present

## 2015-10-02 ENCOUNTER — Ambulatory Visit (INDEPENDENT_AMBULATORY_CARE_PROVIDER_SITE_OTHER): Payer: 59 | Admitting: *Deleted

## 2015-10-02 DIAGNOSIS — J309 Allergic rhinitis, unspecified: Secondary | ICD-10-CM

## 2015-10-09 ENCOUNTER — Ambulatory Visit (INDEPENDENT_AMBULATORY_CARE_PROVIDER_SITE_OTHER): Payer: 59 | Admitting: *Deleted

## 2015-10-09 DIAGNOSIS — J309 Allergic rhinitis, unspecified: Secondary | ICD-10-CM | POA: Diagnosis not present

## 2015-10-12 ENCOUNTER — Emergency Department (HOSPITAL_COMMUNITY)
Admission: EM | Admit: 2015-10-12 | Discharge: 2015-10-13 | Disposition: A | Discharge status: Other Acute Inpatient Hospital | Payer: 59 | Attending: Emergency Medicine | Admitting: Emergency Medicine

## 2015-10-12 ENCOUNTER — Encounter (HOSPITAL_COMMUNITY): Payer: Self-pay | Admitting: Emergency Medicine

## 2015-10-12 DIAGNOSIS — I10 Essential (primary) hypertension: Secondary | ICD-10-CM | POA: Insufficient documentation

## 2015-10-12 DIAGNOSIS — M1990 Unspecified osteoarthritis, unspecified site: Secondary | ICD-10-CM | POA: Diagnosis not present

## 2015-10-12 DIAGNOSIS — Z79899 Other long term (current) drug therapy: Secondary | ICD-10-CM | POA: Insufficient documentation

## 2015-10-12 DIAGNOSIS — E785 Hyperlipidemia, unspecified: Secondary | ICD-10-CM | POA: Insufficient documentation

## 2015-10-12 DIAGNOSIS — R45851 Suicidal ideations: Secondary | ICD-10-CM | POA: Diagnosis present

## 2015-10-12 DIAGNOSIS — F332 Major depressive disorder, recurrent severe without psychotic features: Secondary | ICD-10-CM | POA: Insufficient documentation

## 2015-10-12 DIAGNOSIS — J45909 Unspecified asthma, uncomplicated: Secondary | ICD-10-CM | POA: Insufficient documentation

## 2015-10-12 DIAGNOSIS — Z7951 Long term (current) use of inhaled steroids: Secondary | ICD-10-CM | POA: Diagnosis not present

## 2015-10-12 DIAGNOSIS — Z87891 Personal history of nicotine dependence: Secondary | ICD-10-CM | POA: Insufficient documentation

## 2015-10-12 LAB — COMPREHENSIVE METABOLIC PANEL
ALT: 59 U/L (ref 17–63)
AST: 47 U/L — ABNORMAL HIGH (ref 15–41)
Albumin: 4.7 g/dL (ref 3.5–5.0)
Alkaline Phosphatase: 70 U/L (ref 38–126)
Anion gap: 9 (ref 5–15)
BUN: 17 mg/dL (ref 6–20)
CO2: 23 mmol/L (ref 22–32)
Calcium: 9.2 mg/dL (ref 8.9–10.3)
Chloride: 110 mmol/L (ref 101–111)
Creatinine, Ser: 1.14 mg/dL (ref 0.61–1.24)
GFR calc Af Amer: >60 mL/min (ref >60)
GFR calc non Af Amer: >60 mL/min (ref >60)
Glucose, Bld: 99 mg/dL (ref 65–99)
Potassium: 3.6 mmol/L (ref 3.5–5.1)
Sodium: 142 mmol/L (ref 135–145)
Total Bilirubin: 1.4 mg/dL — ABNORMAL HIGH (ref 0.3–1.2)
Total Protein: 7.3 g/dL (ref 6.5–8.1)

## 2015-10-12 LAB — CBC
HCT: 46.2 % (ref 39.0–52.0)
Hemoglobin: 16.3 g/dL (ref 13.0–17.0)
MCH: 29.9 pg (ref 26.0–34.0)
MCHC: 35.3 g/dL (ref 30.0–36.0)
MCV: 84.8 fL (ref 78.0–100.0)
Platelets: 157 10*3/uL (ref 150–400)
RBC: 5.45 MIL/uL (ref 4.22–5.81)
RDW: 13.3 % (ref 11.5–15.5)
WBC: 9.1 10*3/uL (ref 4.0–10.5)

## 2015-10-12 LAB — SALICYLATE LEVEL: Salicylate Lvl: <4 mg/dL (ref 2.8–30.0)

## 2015-10-12 LAB — ETHANOL: Alcohol, Ethyl (B): <5 mg/dL (ref <5)

## 2015-10-12 LAB — ACETAMINOPHEN LEVEL: Acetaminophen (Tylenol), Serum: <10 ug/mL — ABNORMAL LOW (ref 10–30)

## 2015-10-12 MED ORDER — METOPROLOL SUCCINATE ER 25 MG PO TB24
25.0000 mg | ORAL_TABLET | Freq: Every day | ORAL | Status: DC
  Filled 2015-10-12 (×2): qty 1

## 2015-10-12 MED ORDER — LORATADINE 10 MG PO TABS: 10.0000 mg | ORAL_TABLET | Freq: Every day | ORAL | Status: DC

## 2015-10-12 MED ORDER — MOMETASONE FURO-FORMOTEROL FUM 200-5 MCG/ACT IN AERO
2.0000 | INHALATION_SPRAY | Freq: Two times a day (BID) | RESPIRATORY_TRACT | Status: DC
  Filled 2015-10-12: qty 8.8

## 2015-10-12 MED ORDER — ZOLPIDEM TARTRATE 10 MG PO TABS
10.0000 mg | ORAL_TABLET | Freq: Every evening | ORAL | Status: DC | PRN
  Administered 2015-10-13: 10 mg via ORAL
  Filled 2015-10-12: qty 1

## 2015-10-12 MED ORDER — ROSUVASTATIN CALCIUM 10 MG PO TABS
10.0000 mg | ORAL_TABLET | Freq: Every day | ORAL | Status: DC
  Filled 2015-10-12 (×2): qty 1

## 2015-10-12 MED ORDER — PANTOPRAZOLE SODIUM 40 MG PO TBEC: 40.0000 mg | DELAYED_RELEASE_TABLET | Freq: Two times a day (BID) | ORAL | Status: DC

## 2015-10-12 NOTE — BH Assessment (Signed)
Tele Assessment Note   Arthur Moore is an 65 y.o. male presenting to [facility] under IVC. Pt stated "nothing happen, my wife called the police and it escalated". "They came in my house and I went and tot my gun and told them to leave and that's the end of the story". Pt denies SI, HI and AVH at this time. Per IVC, pt waved his gun at officers and said go ahead and kill me". Pt was not very forthcoming with information during this assessment. Pt denies any previous suicide attempts or self-injurious behaviors. PT did not report any recent stressors and share that his sleep and appetite are poor. PT reported that he has sleep apnea but stated "I can't tolerate the CPAP machine". PT did not report any drug or illicit substance abuse at this time. PT denies any current mental health treatment and shared that he was hospitalized d15 years ago to have his medication adjusted. Pt did not report a history of abuse.  Inpatient treatment is recommended.   Diagnosis: Major Depressive Disorder, Recurrent episode, Severe   Past Medical History:  Past Medical History  Diagnosis Date  . Arthritis   . Depression   . Hypertension   . Aortic insufficiency   . Psoriasis   . Cervical disc disease   . Asthma   . Lumbar disc disease   . Hyperlipidemia 08/24/2015    Past Surgical History  Procedure Laterality Date  . Hernia repair    . Cholecystectomy  2012  . Sinus exploration  2013  . Parotidectomy Right 1992  . Prostate surgery  2002, 2004  . Spine surgery  2013    L C7-T1 laminectomy and discectomy    Family History:  Family History  Problem Relation Age of Onset  . Colon cancer Mother   . Lung cancer Father     Social History:  reports that he has quit smoking. He does not have any smokeless tobacco history on file. He reports that he does not drink alcohol or use illicit drugs.  Additional Social History:  Alcohol / Drug Use History of alcohol / drug use?: No history of alcohol / drug  abuse  CIWA: CIWA-Ar BP: 150/96 mmHg Pulse Rate: 86 COWS:    PATIENT STRENGTHS: (choose at least two) Average or above average intelligence Supportive family/friends  Allergies:  Allergies  Allergen Reactions  . Atorvastatin Other (See Comments)    Calf pain.    Home Medications:  (Not in a hospital admission)  OB/GYN Status:  No LMP for male patient.  General Assessment Data Location of Assessment: [facility] ED TTS Assessment: In system Is this a Tele or Face-to-Face Assessment?: Face-to-Face Is this an Initial Assessment or a Re-assessment for this encounter?: Initial Assessment Marital status: Married Living Arrangements: Spouse/significant other Can pt return to current living arrangement?: Yes Admission Status: Involuntary Is patient capable of signing voluntary admission?: Yes Referral Source: Self/Family/Friend Insurance type: None      Crisis Care Plan Living Arrangements: Spouse/significant other Name of Psychiatrist: None reported.  Name of Therapist: None reported.   Education Status Is patient currently in school?: No  Risk to self with the past 6 months Suicidal Ideation: No (See IVC) Has patient been a risk to self within the past 6 months prior to admission? : No Suicidal Intent: No Has patient had any suicidal intent within the past 6 months prior to admission? : No Is patient at risk for suicide?: No Suicidal Plan?: No Has patient had any suicidal  plan within the past 6 months prior to admission? : No Access to Means: No What has been your use of drugs/alcohol within the last 12 months?: Pt denies alcohol and drug use.  Previous Attempts/Gestures: No How many times?: 0 Other Self Harm Risks: Pt denies  Triggers for Past Attempts: None known (No previous attempts reported. ) Intentional Self Injurious Behavior: None Family Suicide History: No Recent stressful life event(s):  (PT deneis ) Persecutory voices/beliefs?: No Depression:  No Depression Symptoms: Fatigue, Feeling angry/irritable Substance abuse history and/or treatment for substance abuse?: No Suicide prevention information given to non-admitted patients: Not applicable  Risk to Others within the past 6 months Homicidal Ideation: No Does patient have any lifetime risk of violence toward others beyond the six months prior to admission? : No Thoughts of Harm to Others: No Current Homicidal Intent: No Current Homicidal Plan: No Access to Homicidal Means: No Identified Victim: N/A History of harm to others?: No Assessment of Violence: None Noted Violent Behavior Description: No violent behaviors observed. Pt is calm and cooperative at this time. Does patient have access to weapons?: Yes (Comment) Criminal Charges Pending?: No Does patient have a court date: No Is patient on probation?: No  Psychosis Hallucinations: None noted Delusions: None noted  Mental Status Report Appearance/Hygiene: In scrubs Eye Contact: Good Motor Activity: Freedom of movement Speech: Logical/coherent Level of Consciousness: Quiet/awake Mood: Irritable Affect: Irritable Anxiety Level: None Thought Processes: Coherent, Relevant Judgement: Partial Orientation: Person, Place, Situation, Time Obsessive Compulsive Thoughts/Behaviors: None  Cognitive Functioning Concentration: Normal Memory: Recent Intact, Remote Intact IQ: Average Insight: Poor Impulse Control: Poor Appetite: Poor ("I eat once a day". ) Weight Loss: 0 Weight Gain: 0 Sleep: Decreased Total Hours of Sleep: 2 Vegetative Symptoms: None  ADLScreening [facility] Assessment Services) Patient's cognitive ability adequate to safely complete daily activities?: Yes Patient able to express need for assistance with ADLs?: Yes Independently performs ADLs?: Yes (appropriate for developmental age)  Prior Inpatient Therapy Prior Inpatient Therapy: Yes Prior Therapy Dates: 2002 Prior Therapy Facilty/Provider(s):  [place]  Reason for Treatment: Medication adjustment   Prior Outpatient Therapy Prior Outpatient Therapy: No Does patient have an ACCT team?: No Does patient have Intensive In-House Services?  : No Does patient have [facility] services? : No Does patient have P4CC services?: No  ADL Screening (condition at time of admission) Patient's cognitive ability adequate to safely complete daily activities?: Yes Is the patient deaf or have difficulty hearing?: No Does the patient have difficulty seeing, even when wearing glasses/contacts?: No Does the patient have difficulty concentrating, remembering, or making decisions?: No Patient able to express need for assistance with ADLs?: Yes Does the patient have difficulty dressing or bathing?: No Independently performs ADLs?: Yes (appropriate for developmental age)       Abuse/Neglect Assessment (Assessment to be complete while patient is alone) Physical Abuse: Denies Verbal Abuse: Denies Sexual Abuse: Denies Exploitation of patient/patient's resources: Denies Self-Neglect: Denies     Regulatory affairs officer (For Healthcare) Does patient have an advance directive?: No    Additional Information 1:1 In Past 12 Months?: No CIRT Risk: No Elopement Risk: No Does patient have medical clearance?: Yes     Disposition:  Disposition Initial Assessment Completed for this Encounter: Yes Disposition of Patient: Inpatient treatment program Type of inpatient treatment program: Adult  Kim Oki S 10/12/2015 9:56 PM

## 2015-10-12 NOTE — ED Provider Notes (Signed)
CSN: [id number]     Arrival date & time 10/12/15  1531 History   First MD Initiated Contact with Patient 10/12/15 1619     Chief Complaint  Patient presents with  . IVC    . Suicidal      HPI  Patient presents for evaluation by [facility] for suicidal ideations.  Patient has history of depression. States she's been depressed "my whole life". States for the last 2 weeks he's been sleeping most the time he needed a little. He admits that he is severely depressed. He states that tonight he was in bed all day. His wife attempted to speak with him. He states he told her "I might as well just be dead". She became concerned that he is going hurt himself. She called police and paramedics. Police arrived. The patient realized police were in his house he brandished a gun is pointing at police asking them to "please just shoot me".   They were able to convince him to put on his weapon without further incident or injury. He states that he was just angry because "they didn't have my permission to be in my house, and I have permission to carry a weapon".  He cites his second amendment rights.  Past Medical History  Diagnosis Date  . Arthritis   . Depression   . Hypertension   . Aortic insufficiency   . Psoriasis   . Cervical disc disease   . Asthma   . Lumbar disc disease   . Hyperlipidemia 08/24/2015   Past Surgical History  Procedure Laterality Date  . Hernia repair    . Cholecystectomy  2012  . Sinus exploration  2013  . Parotidectomy Right 1992  . Prostate surgery  2002, 2004  . Spine surgery  2013    L C7-T1 laminectomy and discectomy   Family History  Problem Relation Age of Onset  . Colon cancer Mother   . Lung cancer Father    Social History  Substance Use Topics  . Smoking status: Former Research scientist (life sciences)  . Smokeless tobacco: None  . Alcohol Use: No    Review of Systems  Constitutional: Negative for fever, chills, diaphoresis, appetite change and fatigue.   HENT: Negative for mouth sores, sore throat and trouble swallowing.   Eyes: Negative for visual disturbance.  Respiratory: Negative for cough, chest tightness, shortness of breath and wheezing.   Cardiovascular: Negative for chest pain.  Gastrointestinal: Negative for nausea, vomiting, abdominal pain, diarrhea and abdominal distention.  Endocrine: Negative for polydipsia, polyphagia and polyuria.  Genitourinary: Negative for dysuria, frequency and hematuria.  Musculoskeletal: Negative for gait problem.  Skin: Negative for color change, pallor and rash.  Neurological: Negative for dizziness, syncope, light-headedness and headaches.  Hematological: Does not bruise/bleed easily.  Psychiatric/Behavioral: Positive for suicidal ideas, sleep disturbance, dysphoric mood and decreased concentration. Negative for behavioral problems and confusion.      Allergies  Atorvastatin  Home Medications   Prior to Admission medications   Medication Sig Start Date End Date Taking? Authorizing Provider  budesonide-formoterol (SYMBICORT) 160-4.5 MCG/ACT inhaler Inhale 2 puffs into the lungs 2 (two) times daily.   Yes Historical Provider, MD  cetirizine (ZYRTEC) 10 MG tablet Take 10 mg by mouth daily.   Yes Historical Provider, MD  EPIPEN 2-PAK 0.3 MG/0.3ML SOAJ injection Inject 1 Device as directed as needed. 12/27/14  Yes Historical Provider, MD  metoprolol succinate (TOPROL-XL) 25 MG 24 hr tablet Take 25 mg by mouth daily.    Yes  Historical Provider, MD  pantoprazole (PROTONIX) 40 MG tablet Take 1 tablet (40 mg total) by mouth 2 (two) times daily. Patient taking differently: Take 40 mg by mouth daily.  08/08/15  Yes Jiles Prows, MD  rosuvastatin (CRESTOR) 10 MG tablet Take 1 tablet (10 mg total) by mouth daily. 02/07/15  Yes Skeet Latch, MD   BP 150/96 mmHg  Pulse 86  Temp(Src) 98.3 F (36.8 C) (Oral)  Resp 16  SpO2 96% Physical Exam  Constitutional: He is oriented to person, place, and time.  He appears well-developed and well-nourished. No distress.  HENT:  Head: Normocephalic.  Eyes: Conjunctivae are normal. Pupils are equal, round, and reactive to light. No scleral icterus.  Neck: Normal range of motion. Neck supple. No thyromegaly present.  Cardiovascular: Normal rate and regular rhythm.  Exam reveals no gallop and no friction rub.   No murmur heard. Pulmonary/Chest: Effort normal and breath sounds normal. No respiratory distress. He has no wheezes. He has no rales.  Abdominal: Soft. Bowel sounds are normal. He exhibits no distension. There is no tenderness. There is no rebound.  Musculoskeletal: Normal range of motion.  Neurological: He is alert and oriented to person, place, and time.  Skin: Skin is warm and dry. No rash noted.  Psychiatric: His behavior is normal. His speech is delayed. He exhibits a depressed mood.  Flippant ideations. States that he still wishes he were dead.    ED Course  Procedures (including critical care time) Labs Review Labs Reviewed  COMPREHENSIVE METABOLIC PANEL - Abnormal; Notable for the following:    AST 47 (*)    Total Bilirubin 1.4 (*)    All other components within normal limits  ACETAMINOPHEN LEVEL - Abnormal; Notable for the following:    Acetaminophen (Tylenol), Serum <10 (*)    All other components within normal limits  ETHANOL  SALICYLATE LEVEL  CBC  URINE RAPID DRUG SCREEN, HOSP PERFORMED    Imaging Review No results found. I have personally reviewed and evaluated these images and lab results as part of my medical decision-making.   EKG Interpretation None      MDM   Final diagnoses:  Severe episode of recurrent major depressive disorder, without psychotic features ([place])    Medically clear. Will require psychiatric admission. Is under IVC.    Tanna Furry, MD 10/12/15 [phone]

## 2015-10-12 NOTE — ED Notes (Addendum)
Pt tried to get rings off of Lt and rt hands but will not come off. 2 rings

## 2015-10-12 NOTE — ED Notes (Addendum)
Pt belongings: shirt, black shorts, sandals. Behind the nurses station in triage.

## 2015-10-12 NOTE — ED Notes (Signed)
Pt wife called [employer] concerned about his actions. Pt IVC per [employer] for waving gun saying go ahead and kill me. With triage pt states wife concerned because he has no motivation after retiring; pt denies SI/HI. Pt continues to report waved gun because police came into his house unannounced and he has a right to carry.

## 2015-10-12 NOTE — BH Assessment (Signed)
Assessment completed. Consulted Serena Colonel, NP who recommended inpatient treatment. TTS to seek placement. Informed Dr. Jeneen Rinks of the recommendation.

## 2015-10-13 ENCOUNTER — Encounter (HOSPITAL_COMMUNITY): Payer: Self-pay | Admitting: Nurse Practitioner

## 2015-10-13 ENCOUNTER — Inpatient Hospital Stay (HOSPITAL_COMMUNITY)
Admission: AD | Admit: 2015-10-13 | Discharge: 2015-10-19 | DRG: 885 | Disposition: A | Discharge status: Home / Self Care | Payer: Medicare Other | Source: Intra-hospital | Attending: Psychiatry | Admitting: Psychiatry

## 2015-10-13 DIAGNOSIS — F332 Major depressive disorder, recurrent severe without psychotic features: Secondary | ICD-10-CM | POA: Diagnosis present

## 2015-10-13 DIAGNOSIS — R45851 Suicidal ideations: Secondary | ICD-10-CM | POA: Diagnosis present

## 2015-10-13 LAB — RAPID URINE DRUG SCREEN, HOSP PERFORMED
Amphetamines: NOT DETECTED
Barbiturates: NOT DETECTED
Benzodiazepines: NOT DETECTED
Cocaine: NOT DETECTED
Opiates: NOT DETECTED
Tetrahydrocannabinol: NOT DETECTED

## 2015-10-13 MED ORDER — LORATADINE 10 MG PO TABS
10.0000 mg | ORAL_TABLET | Freq: Every day | ORAL | Status: DC
  Administered 2015-10-13 – 2015-10-14 (×2): 10 mg via ORAL
  Filled 2015-10-13 (×4): qty 1

## 2015-10-13 MED ORDER — MOMETASONE FURO-FORMOTEROL FUM 200-5 MCG/ACT IN AERO
2.0000 | INHALATION_SPRAY | Freq: Two times a day (BID) | RESPIRATORY_TRACT | Status: DC
  Administered 2015-10-14: 2 via RESPIRATORY_TRACT
  Filled 2015-10-13: qty 8.8

## 2015-10-13 MED ORDER — ROSUVASTATIN CALCIUM 10 MG PO TABS
10.0000 mg | ORAL_TABLET | Freq: Every day | ORAL | Status: DC
  Administered 2015-10-13 – 2015-10-19 (×7): 10 mg via ORAL
  Filled 2015-10-13 (×4): qty 1

## 2015-10-13 MED ORDER — TRAZODONE HCL 50 MG PO TABS
50.0000 mg | ORAL_TABLET | Freq: Every evening | ORAL | Status: DC | PRN
  Administered 2015-10-13: 50 mg via ORAL
  Filled 2015-10-13: qty 1

## 2015-10-13 MED ORDER — HYDROXYZINE HCL 50 MG PO TABS
50.0000 mg | ORAL_TABLET | Freq: Four times a day (QID) | ORAL | Status: DC | PRN
  Administered 2015-10-13 – 2015-10-18 (×4): 50 mg via ORAL
  Filled 2015-10-13 (×6): qty 1

## 2015-10-13 MED ORDER — TRAZODONE HCL 50 MG PO TABS
ORAL_TABLET | ORAL | Status: AC
  Administered 2015-10-13: 50 mg
  Filled 2015-10-13: qty 1

## 2015-10-13 MED ORDER — LAMOTRIGINE 25 MG PO TABS
ORAL_TABLET | ORAL | Status: AC
  Administered 2015-10-13: 15:00:00
  Filled 2015-10-13: qty 1

## 2015-10-13 MED ORDER — LAMOTRIGINE 25 MG PO TABS
25.0000 mg | ORAL_TABLET | Freq: Every day | ORAL | Status: DC
  Administered 2015-10-14 – 2015-10-16 (×3): 25 mg via ORAL
  Filled 2015-10-13 (×6): qty 1

## 2015-10-13 MED ORDER — PANTOPRAZOLE SODIUM 40 MG PO TBEC
40.0000 mg | DELAYED_RELEASE_TABLET | Freq: Two times a day (BID) | ORAL | Status: DC
  Administered 2015-10-13 – 2015-10-19 (×7): 40 mg via ORAL
  Filled 2015-10-13 (×8): qty 1

## 2015-10-13 MED ORDER — ACETAMINOPHEN 325 MG PO TABS
650.0000 mg | ORAL_TABLET | Freq: Four times a day (QID) | ORAL | Status: DC | PRN
  Administered 2015-10-13 – 2015-10-18 (×5): 650 mg via ORAL
  Filled 2015-10-13 (×5): qty 2

## 2015-10-13 MED ORDER — METOPROLOL SUCCINATE ER 25 MG PO TB24
25.0000 mg | ORAL_TABLET | Freq: Every day | ORAL | Status: DC
  Administered 2015-10-13 – 2015-10-14 (×2): 25 mg via ORAL
  Filled 2015-10-13 (×4): qty 1

## 2015-10-13 NOTE — BHH Suicide Risk Assessment (Signed)
[facility] Admission Suicide Risk Assessment   Nursing information obtained from:  Patient Demographic factors:  Male, Caucasian, Access to firearms Current Mental Status:  NA Loss Factors:  Financial problems / change in socioeconomic status Historical Factors:  NA Risk Reduction Factors:  Living with another person, especially a relative  Total Time spent with patient: 1.5 hours Principal Problem: <principal problem not specified> Diagnosis:   Patient Active Problem List   Diagnosis Date Noted  . MDD (major depressive disorder), recurrent severe, without psychosis ([place]) [F33.2] 10/13/2015  . Hyperlipidemia [E78.5] 08/24/2015  . Arthritis [M19.90]   . Depression [F32.9]   . Hypertension [I10]   . Aortic insufficiency [I35.1]   . Psoriasis [L40.9]   . Cervical disc disease [M50.90]   . Lumbar disc disease [M51.9]   . Asthma [J45.909] 01/06/2015  . Psoriatic arthritis ([place]) [L40.50] 01/06/2015  . 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 GERD (gastroesophageal reflux disease) [K21.9] 01/06/2015  . Laryngopharyngeal reflux (LPR) [J38.7] 01/06/2015   Subjective Data: Feels frustrated that he is here. Not forthcoming of history . Mood upset . Says does not want to be on any anti depressants.   Continued Clinical Symptoms:  Alcohol Use Disorder Identification Test Final Score (AUDIT): 0 The "Alcohol Use Disorders Identification Test", Guidelines for Use in Primary Care, Second Edition.  World Pharmacologist [facility]). Score between 0-7:  no or low risk or alcohol related problems. Score between 8-15:  moderate risk of alcohol related problems. Score between 16-19:  high risk of alcohol related problems. Score 20 or above:  warrants further diagnostic evaluation for alcohol dependence and treatment.   CLINICAL FACTORS:   Depression:   Anhedonia Hopelessness Impulsivity Unstable or Poor Therapeutic Relationship Previous Psychiatric Diagnoses and  Treatments   Musculoskeletal: Strength & Muscle Tone: within normal limits Gait & Station: normal Patient leans: no lean  Psychiatric Specialty Exam: Physical Exam  Constitutional: He appears well-developed.  HENT:  Head: Normocephalic.    Review of Systems  Cardiovascular: Negative for chest pain.  Neurological: Negative for tremors.  Psychiatric/Behavioral: Positive for depression. The patient is nervous/anxious.     Blood pressure 141/103, pulse 108, temperature 98 F (36.7 C), temperature source Oral, resp. rate 16, height 5\' 11"  (1.803 m), weight 85.73 kg (189 lb), SpO2 99 %.Body mass index is 26.37 kg/(m^2).  General Appearance: Casual and Disheveled  Eye Contact:  Minimal  Speech:  Normal Rate  Volume:  Increased  Mood:  Depressed and Irritable  Affect:  Congruent and Labile  Thought Process:  Goal Directed  Orientation:  Full (Time, Place, and Person)  Thought Content:  Rumination  Suicidal Thoughts:  No  Homicidal Thoughts:  No  Memory:  Immediate;   Fair Recent;   Fair  Judgement:  Poor  Insight:  Shallow  Psychomotor Activity:  Normal  Concentration:  Concentration: Fair and Attention Span: Fair  Recall:  [employer] of Knowledge:  Fair  Language:  Fair  Akathisia:  Negative  Handed:  Right  AIMS (if indicated):     Assets:  Others:  lives with his wife. poor communication skills  ADL's:  Intact  Cognition:  WNL  Sleep:         COGNITIVE FEATURES THAT CONTRIBUTE TO RISK:  Closed-mindedness    SUICIDE RISK:   Severe:  Frequent, intense, and enduring suicidal ideation, specific plan, no subjective intent, but some objective markers of intent (i.e., choice of lethal method), the method is accessible, some limited preparatory behavior, evidence of  impaired self-control, severe dysphoria/symptomatology, multiple risk factors present, and few if any protective factors, particularly a lack of social support.  PLAN OF CARE: Admit for safety and management  for depression and mood symptoms.   I certify that inpatient services furnished can reasonably be expected to improve the patient's condition.   Merian Capron, MD 10/13/2015, 10:57 AM

## 2015-10-13 NOTE — Progress Notes (Signed)
Arthur Moore is very angry and uncooperative with questions during assessment. He refused to sign any papers stating "I don't have my glasses and I am not signing anything I can't read!" He refused again even after this RN offered to read the unit handouts verbatim. He is not forthcoming with any additional information as to why he is here and is blaming his wife for "having me arrested and embarrassing me outside my home. I can't go back there after all my neighbors have seen be being hauled off in a police car. If my wife calls up here tell her I do not want to ever speak to her again!" He denies SI/HI/AVH at this time. Rates Depression 3/10 and Anxiety 0/10. When questioned about having access to firearms he states "I have a right to protect myself. The police came into my home without my approval. White lives matter just like black lives matter". Assessment was unable to be completed due to patient's anger and irritability. He stated " I have not slept in 2 days. That Ambien they gave me was useless!!! I need some rest right now". At this time patient has been escorted to the unit by this RN and will be monitored Q 15 minutes for patient safety and medication effectiveness.

## 2015-10-13 NOTE — Progress Notes (Signed)
D: Pt denies SI/HI/AVH. Pt is pleasant and cooperative. Pt goal is to just make it through the day. A: Pt was offered support and encouragement. Pt was given scheduled medications. Pt was encourage to attend groups. Q 15 minute checks were done for safety.  R: Pt is taking medication. Pt has no complaints.Pt receptive to treatment and safety maintained on unit.

## 2015-10-13 NOTE — BHH Group Notes (Signed)
[facility] Group Notes: (Clinical Social Work)   10/13/2015      Type of Therapy:  Group Therapy   Participation Level:  Did Not Attend despite MHT prompting   Selmer Dominion, LCSW 10/13/2015, 11:39 AM

## 2015-10-13 NOTE — Tx Team (Addendum)
Initial Interdisciplinary Treatment Plan   PATIENT STRESSORS: Financial difficulties Health problems Marital or family conflict   PATIENT STRENGTHS: Average or above average intelligence General fund of knowledge   PROBLEM LIST: Problem List/Patient Goals Date to be addressed Date deferred Reason deferred Estimated date of resolution  "Nothing really"!!!      Suicidal Thoughts 10-13-2015     Depression 10-13-2015                                          DISCHARGE CRITERIA:  Improved stabilization in mood, thinking, and/or behavior Motivation to continue treatment in a less acute level of care Need for constant or close observation no longer present  PRELIMINARY DISCHARGE PLAN: Outpatient therapy Placement in alternative living arrangements  PATIENT/FAMIILY INVOLVEMENT: This treatment plan has been presented to and reviewed with the patient, Arthur Moore.  The patient and family have been given the opportunity to ask questions and make suggestions.  Gildardo Pounds 10/13/2015, 6:37 AM

## 2015-10-13 NOTE — Progress Notes (Addendum)
Arthur Moore is observed laying supine in his bed with the covers pulled up over his head. When this writer approached him and asked him if he wanted his morning medications and he replied " no..ibuprofen don't want anything and I want you to leave me alone ... NOW!!! " A Writer  Spoke with pt, allowing him to be heard and to vent his feelings. He said " I'm done...ibuprofen can't go back home now..I'm so embarassed. My neighbors saw the police cart me off... This is awful". R Pt refused his morning medications. RN spoke with MD requesting he speak with pt again regarding his IVC paperwork. Safety in place. [place] to have difff dealing with his INTENSE feelings of embarassment ( about being IVC'Arthur) , loss of control and anger over being arrested. He has allowed this Probation officer to speak with him, to trust him and  Has evnen taken his lamictal as well as his other meds ( that he previously refused).

## 2015-10-13 NOTE — H&P (Signed)
Psychiatric Admission Assessment Adult  Patient Identification: Arthur Moore MRN:  [id number] Date of Evaluation:  10/13/2015 Chief Complaint:  MDD Recurrent Severe Principal Diagnosis: <principal problem not specified> Diagnosis:   Patient Active Problem List   Diagnosis Date Noted  . MDD (major depressive disorder), recurrent severe, without psychosis ([place]) [F33.2] 10/13/2015  . Hyperlipidemia [E78.5] 08/24/2015  . Arthritis [M19.90]   . Depression [F32.9]   . Hypertension [I10]   . Aortic insufficiency [I35.1]   . Psoriasis [L40.9]   . Cervical disc disease [M50.90]   . Lumbar disc disease [M51.9]   . Asthma [J45.909] 01/06/2015  . Psoriatic arthritis ([place]) [L40.50] 01/06/2015  . 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 GERD (gastroesophageal reflux disease) [K21.9] 01/06/2015  . 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 Patient has history of depression. States she's been depressed "my whole life". States for the last 2 weeks he's been sleeping most the time he needed a little. He admits that he is severely depressed. He states that tonight he was in bed all day. His wife attempted to speak with him. He states he told her "I might as well just be dead". She became concerned that he is going hurt himself. She called police and paramedics. Police arrived. The patient realized police were in his house he brandished a gun is pointing at police asking them to "please just shoot me". They were able to convince him to put on his weapon without further incident or injury. He states that he was just angry because "they didn't have my permission to be in my house, and I have permission to carry a weaponPt wife called [employer] concerned about his actions. Pt IVC per [employer] for waving gun saying go ahead and kill me. With triage pt states wife  concerned because he has no motivation after retiring; pt denies SI/HI. Pt continues to report waved gun because police came into his house unannounced and he has a right to carry.    On Evaluation: Patient is irritable and agitated about his admission. Patient reports a history of depression and is upset that he is being held against his will. Patient reports last inpatient hospitalization was about 15 years ago and recently stopped taken Cymbalta. none of this medications work." Patient report he would like to be discharge to a hotel because he is too upset with his wife. Patient states this is truly embarrassing, I am done with her." Patient denies suicidal or homicidal ideations. Denies depression or depressive symptoms. Patient denies auditory or visual hallucinations. Supports, reassurance and encouragement was provided.  Associated Signs/Symptoms: Depression Symptoms:  depressed mood, difficulty concentrating, anxiety, panic attacks, (Hypo) Manic Symptoms:  Distractibility, Irritable Mood, Labiality of Mood, Anxiety Symptoms:  Social Anxiety, Psychotic Symptoms:  Hallucinations: None Paranoia, PTSD Symptoms: Avoidance:  Decreased Interest/Participation Total Time spent with patient: 30 minutes  Past Psychiatric History: See Above  Is the patient at risk to self? Yes.    Has the patient been a risk to self in the past 6 months? Yes.    Has the patient been a risk to self within the distant past? Yes.    Is the patient a risk to others? Yes.    Has the patient been a risk to others in the past 6 months? Yes.    Has the patient been a risk to others within the distant past? Yes.     Prior  Inpatient Therapy:   Prior Outpatient Therapy:    Alcohol Screening: 1. How often do you have a drink containing alcohol?: Never 2. How many drinks containing alcohol do you have on a typical day when you are drinking?: 1 or 2 3. How often do you have six or more drinks on one occasion?:  Never Preliminary Score: 0 4. How often during the last year have you found that you were not able to stop drinking once you had started?: Never 5. How often during the last year have you failed to do what was normally expected from you becasue of drinking?: Never 6. How often during the last year have you needed a first drink in the morning to get yourself going after a heavy drinking session?: Never 7. How often during the last year have you had a feeling of guilt of remorse after drinking?: Never 8. How often during the last year have you been unable to remember what happened the night before because you had been drinking?: Never 9. Have you or someone else been injured as a result of your drinking?: No 10. Has a relative or friend or a doctor or another health worker been concerned about your drinking or suggested you cut down?: No Alcohol Use Disorder Identification Test Final Score (AUDIT): 0 Brief Intervention: AUDIT score less than 7 or less-screening does not suggest unhealthy drinking-brief intervention not indicated Substance Abuse History in the last 12 months:  No. Consequences of Substance Abuse: Withdrawal Symptoms:   None Previous Psychotropic Medications: YES Psychological Evaluations: yes Past Medical History:  Past Medical History  Diagnosis Date  . Arthritis   . Depression   . Hypertension   . Aortic insufficiency   . Psoriasis   . Cervical disc disease   . Asthma   . Lumbar disc disease   . Hyperlipidemia 08/24/2015    Past Surgical History  Procedure Laterality Date  . Hernia repair    . Cholecystectomy  2012  . Sinus exploration  2013  . Parotidectomy Right 1992  . Prostate surgery  2002, 2004  . Spine surgery  2013    L C7-T1 laminectomy and discectomy   Family History:  Family History  Problem Relation Age of Onset  . Colon cancer Mother   . Lung cancer Father    Family Psychiatric  History: See Above Tobacco Screening:  _0 [phone])::1)@ Social History:  History  Alcohol Use No     History  Drug Use No    Additional Social History:      History of alcohol / drug use?: No history of alcohol / drug abuse                    Allergies:   Allergies  Allergen Reactions  . Atorvastatin Other (See Comments)    Calf pain.   Lab Results:  Results for orders placed or performed during the hospital encounter of 10/12/15 (from the past 48 hour(s))  Comprehensive metabolic panel     Status: Abnormal   Collection Time: 10/12/15  5:15 PM  Result Value Ref Range   Sodium 142 135 - 145 mmol/L   Potassium 3.6 3.5 - 5.1 mmol/L   Chloride 110 101 - 111 mmol/L   CO2 23 22 - 32 mmol/L   Glucose, Bld 99 65 - 99 mg/dL   BUN 17 6 - 20 mg/dL   Creatinine, Ser 1.14 0.61 - 1.24 mg/dL   Calcium 9.2 8.9 - 10.3 mg/dL   Total Protein  7.3 6.5 - 8.1 g/dL   Albumin 4.7 3.5 - 5.0 g/dL   AST 47 (H) 15 - 41 U/L   ALT 59 17 - 63 U/L   Alkaline Phosphatase 70 38 - 126 U/L   Total Bilirubin 1.4 (H) 0.3 - 1.2 mg/dL   GFR calc non Af Amer >60 >60 mL/min   GFR calc Af Amer >60 >60 mL/min    Comment: (NOTE) The eGFR has been calculated using the CKD EPI equation. This calculation has not been validated in all clinical situations. eGFR's persistently <60 mL/min signify possible Chronic Kidney Disease.    Anion gap 9 5 - 15  Ethanol     Status: None   Collection Time: 10/12/15  5:15 PM  Result Value Ref Range   Alcohol, Ethyl (B) <5 <5 mg/dL    Comment:        LOWEST DETECTABLE LIMIT FOR SERUM ALCOHOL IS 5 mg/dL FOR MEDICAL PURPOSES ONLY   Salicylate level     Status: None   Collection Time: 10/12/15  5:15 PM  Result Value Ref Range   Salicylate Lvl <0.9 2.8 - 30.0 mg/dL  Acetaminophen level     Status: Abnormal   Collection Time: 10/12/15  5:15 PM  Result Value Ref Range   Acetaminophen (Tylenol), Serum <10 (L) 10 - 30 ug/mL    Comment:        THERAPEUTIC CONCENTRATIONS VARY SIGNIFICANTLY. A RANGE  OF 10-30 ug/mL MAY BE AN EFFECTIVE CONCENTRATION FOR MANY PATIENTS. HOWEVER, SOME ARE BEST TREATED AT CONCENTRATIONS OUTSIDE THIS RANGE. ACETAMINOPHEN CONCENTRATIONS >150 ug/mL AT 4 HOURS AFTER INGESTION AND >50 ug/mL AT 12 HOURS AFTER INGESTION ARE OFTEN ASSOCIATED WITH TOXIC REACTIONS.   cbc     Status: None   Collection Time: 10/12/15  5:15 PM  Result Value Ref Range   WBC 9.1 4.0 - 10.5 K/uL   RBC 5.45 4.22 - 5.81 MIL/uL   Hemoglobin 16.3 13.0 - 17.0 g/dL   HCT 46.2 39.0 - 52.0 %   MCV 84.8 78.0 - 100.0 fL   MCH 29.9 26.0 - 34.0 pg   MCHC 35.3 30.0 - 36.0 g/dL   RDW 13.3 11.5 - 15.5 %   Platelets 157 150 - 400 K/uL  Rapid urine drug screen (hospital performed)     Status: None   Collection Time: 10/13/15  1:01 AM  Result Value Ref Range   Opiates NONE DETECTED NONE DETECTED   Cocaine NONE DETECTED NONE DETECTED   Benzodiazepines NONE DETECTED NONE DETECTED   Amphetamines NONE DETECTED NONE DETECTED   Tetrahydrocannabinol NONE DETECTED NONE DETECTED   Barbiturates NONE DETECTED NONE DETECTED    Comment:        DRUG SCREEN FOR MEDICAL PURPOSES ONLY.  IF CONFIRMATION IS NEEDED FOR ANY PURPOSE, NOTIFY LAB WITHIN 5 DAYS.        LOWEST DETECTABLE LIMITS FOR URINE DRUG SCREEN Drug Class       Cutoff (ng/mL) Amphetamine      1000 Barbiturate      200 Benzodiazepine   811 Tricyclics       914 Opiates          300 Cocaine          300 THC              50     Blood Alcohol level:  Lab Results  Component Value Date   ETH <5 78/29/5621    Metabolic Disorder Labs:  No results found for:  HGBA1C, MPG No results found for: PROLACTIN No results found for: CHOL, TRIG, HDL, CHOLHDL, VLDL, LDLCALC  Current Medications: Current Facility-Administered Medications  Medication Dose Route Frequency Provider Last Rate Last Dose  . acetaminophen (TYLENOL) tablet 650 mg  650 mg Oral Q6H PRN Lurena Nida, NP   650 mg at 10/13/15 1115  . lamoTRIgine (LAMICTAL) 25 MG tablet            . lamoTRIgine (LAMICTAL) tablet 25 mg  25 mg Oral Daily Merian Capron, MD   25 mg at 10/13/15 1230  . loratadine (CLARITIN) tablet 10 mg  10 mg Oral Daily Lurena Nida, NP   10 mg at 10/13/15 1551  . metoprolol succinate (TOPROL-XL) 24 hr tablet 25 mg  25 mg Oral Daily Lurena Nida, NP   25 mg at 10/13/15 1552  . mometasone-formoterol (DULERA) 200-5 MCG/ACT inhaler 2 puff  2 puff Inhalation BID Lurena Nida, NP   2 puff at 10/13/15 0800  . pantoprazole (PROTONIX) EC tablet 40 mg  40 mg Oral BID Lurena Nida, NP   40 mg at 10/13/15 1551  . rosuvastatin (CRESTOR) tablet 10 mg  10 mg Oral Daily Lurena Nida, NP   10 mg at 10/13/15 1551  . traZODone (DESYREL) tablet 50 mg  50 mg Oral QHS PRN Lurena Nida, NP       PTA Medications: Prescriptions prior to admission  Medication Sig Dispense Refill Last Dose  . budesonide-formoterol (SYMBICORT) 160-4.5 MCG/ACT inhaler Inhale 2 puffs into the lungs 2 (two) times daily.   10/11/2015 at Unknown time  . cetirizine (ZYRTEC) 10 MG tablet Take 10 mg by mouth daily.   10/11/2015 at Unknown time  . EPIPEN 2-PAK 0.3 MG/0.3ML SOAJ injection Inject 1 Device as directed as needed.  1 Never used  . metoprolol succinate (TOPROL-XL) 25 MG 24 hr tablet Take 25 mg by mouth daily.    10/11/2015 at 1100  . pantoprazole (PROTONIX) 40 MG tablet Take 1 tablet (40 mg total) by mouth 2 (two) times daily. (Patient taking differently: Take 40 mg by mouth daily. ) 90 tablet 0 10/11/2015 at Unknown time  . rosuvastatin (CRESTOR) 10 MG tablet Take 1 tablet (10 mg total) by mouth daily. 90 tablet 3 10/11/2015 at Unknown time    Musculoskeletal: Strength & Muscle Tone: within normal limits Gait & Station: normal Patient leans: N/A  Psychiatric Specialty Exam: Physical Exam  Nursing note and vitals reviewed. Constitutional: He is oriented to person, place, and time. He appears well-developed.  Musculoskeletal: Normal range of motion.  Neurological: He is oriented to  person, place, and time.  Psychiatric: He has a normal mood and affect. His behavior is normal.    Review of Systems  Psychiatric/Behavioral: Positive for depression.  All other systems reviewed and are negative.   Blood pressure 141/103, pulse 108, temperature 98 F (36.7 C), temperature source Oral, resp. rate 16, height 5' 11" (1.803 m), weight 85.73 kg (189 lb), SpO2 99 %.Body mass index is 26.37 kg/(m^2).  General Appearance: Casual  Eye Contact:  Minimal  Speech:  Clear and Coherent  Volume:  Normal  Mood:  Anxious, Depressed and Irritable  Affect:  Congruent  Thought Process:  Linear  Orientation:  Full (Time, Place, and Person)  Thought Content:  Hallucinations: None  Suicidal Thoughts:  No  Homicidal Thoughts:  No  Memory:  Immediate;   Poor Recent;   Poor Remote;   Poor  Judgement:  Poor  Insight:  Lacking  Psychomotor Activity:  Restlessness  Concentration:  Concentration: Poor  Recall:  Poor  Fund of Knowledge:  Poor  Language:  Fair  Akathisia:  No  Handed:  Right  AIMS (if indicated):     Assets:  Desire for Improvement Financial Resources/Insurance Resilience  ADL's:  Intact  Cognition:  WNL  Sleep:        I agree with current treatment plan on 10/13/2015, Patient seen face-to-face for psychiatric evaluation follow-up, chart reviewed and case discussed with the MD De Nurse. Reviewed the information documented and agree with the treatment plan.  Treatment Plan Summary: Daily contact with patient to assess and evaluate symptoms and progress in treatment and Medication management  Continue with Lamictal 61m for mood stabilization. Continue with Trazodone 100 mg for insomnia Will continue to monitor vitals ,medication compliance and treatment side effects while patient is here.  Reviewed labs: Glucose 101 elevated ,BAL - 0, UDS -negative CSW will start working on disposition.  Patient to participate in therapeutic milieu  Observation  Level/Precautions:  15 minute checks  Laboratory:  CBC Chemistry Profile HCG UDS UA  Psychotherapy:  Individual and group session  Medications:  See Above  Consultations:  Psychiatry  Discharge Concerns:  Safety, stabilization, and risk of access to medication and medication stabilization   Estimated LOS: [id number] Other:     I certify that inpatient services furnished can reasonably be expected to improve the patient's condition.    TDerrill [place] NP 6/17/20174:28 PM I have examined the patient and agreed with the findings of H&P and treatment plan. I have also done suicide assessment on this patient.

## 2015-10-13 NOTE — BH Assessment (Addendum)
Pt has accepted to [facility] room 405 bed 1 (Dr. Parke Poisson). Informed Dr. Stark Jock of the recommendation.

## 2015-10-13 NOTE — Progress Notes (Signed)
Psychoeducational Group Note  Date:  10/13/2015 Time:  2309  Group Topic/Focus:  Wrap-Up Group:   The focus of this group is to help patients review their daily goal of treatment and discuss progress on daily workbooks.  Participation Level: Did Not Attend  Participation Quality:  Not Applicable  Affect:  Not Applicable  Cognitive:  Not Applicable  Insight:  Not Applicable  Engagement in Group: Not Applicable  Additional Comments:  The patient refused to attend this evening's group.   Archie Balboa S 10/13/2015, 11:09 PM
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
## 2015-10-14 NOTE — Progress Notes (Signed)
D   Pt is pleasant on approach and is cooperative   His family brought in his medications so he took all of the medications that he didn't take earlier today and did not want a sleep medication    He appears depressed and sad but was also seen talking on the phone making positive statements and smiling    He interacts appropriately with others A    Verbal  support given    Medications administered and effectiveness monitored    Q 15 min checks R    Pt safe at present

## 2015-10-14 NOTE — Progress Notes (Signed)
Patient did not attend wrap up group. 

## 2015-10-14 NOTE — BHH Group Notes (Signed)
[facility] LCSW Group Therapy  10/14/2015 10:10 until 11 AM  Type of Therapy:  Group Therapy  Participation Level:  Did Not Attend; despite overhead announcement on 400 hall and CSW personally asking pt to attend group in 300 hall dayroom.     Sheilah Pigeon, LCSW  10/14/2015

## 2015-10-14 NOTE — Progress Notes (Signed)
Arthur Moore is seen lying i his bed. He awakens quickly when this writer calls his name to awaken him. He is pleasant, in control and not emotional when he speaks today. A He says he does not want to get up and take any medication today, then agress to " take that one that will help my mood" and he is given po lamictal as ordered. He completed his daily assessment and on it he wrote he denied SI today and he rated is depression, hopelessness and anxiety " 3/3/3", respectively. R Safety in place. He is seen sitting in the dayroom, 1 hr today and pos encouragement is offered by this Probation officer.

## 2015-10-14 NOTE — Progress Notes (Signed)
[facility] MD Progress Note  10/14/2015 11:51 AM Arthur Moore  MRN:  [id number] Subjective:  Alert oriented . Cooperative. Slept fair.   Patient was admitted with depression and waved a gun to police when they came. Says he has now talked to wife and their is no anamosity. Lamictal was started yesterday.  Feels better, less depressed. States have no intent to harm himself  No psychotic symptoms Insight has improved Wants to know outpatient provider so he can seek help outside [place] system. Says he has been so meds in past but never remained compliant.  Understands meds will take time .  Does not drink or use drugs Has poor sleep  No manic symptoms. No disruptive behaviour today  Principal Problem: <principal problem not specified> Diagnosis:   Patient Active Problem List   Diagnosis Date Noted  . MDD (major depressive disorder), recurrent severe, without psychosis ([place]) [F33.2] 10/13/2015  . Hyperlipidemia [E78.5] 08/24/2015  . Arthritis [M19.90]   . Depression [F32.9]   . Hypertension [I10]   . Aortic insufficiency [I35.1]   . Psoriasis [L40.9]   . Cervical disc disease [M50.90]   . Lumbar disc disease [M51.9]   . Asthma [J45.909] 01/06/2015  . Psoriatic arthritis ([place]) [L40.50] 01/06/2015  . 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 GERD (gastroesophageal reflux disease) [K21.9] 01/06/2015  . 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 Arthritis   . Depression   . Hypertension   . Aortic insufficiency   . Psoriasis   . Cervical disc disease   . Asthma   . Lumbar disc disease   . Hyperlipidemia 08/24/2015    Past Surgical History  Procedure Laterality Date  . Hernia repair    . Cholecystectomy  2012  . Sinus exploration  2013  . Parotidectomy Right 1992  . Prostate surgery  2002, 2004  . Spine  surgery  2013    L C7-T1 laminectomy and discectomy   Family History:  Family History  Problem Relation Age of Onset  . Colon cancer Mother   . Lung cancer Father     Social History:  History  Alcohol Use No     History  Drug Use No    Social History   Social History  . Marital Status: Married    Spouse Name: N/A  . Number of Children: N/A  . Years of Education: N/A   Social History Main Topics  . Smoking status: Never Smoker   . Smokeless tobacco: None  . Alcohol Use: No  . Drug Use: No  . Sexual Activity: Not Asked   Other Topics Concern  . None   Social History Narrative   Additional Social History:    History of alcohol / drug use?: No history of alcohol / drug abuse                    Sleep: Fair  Appetite:  Fair  Current Medications: Current Facility-Administered Medications  Medication Dose Route Frequency Provider Last Rate Last Dose  . acetaminophen (TYLENOL) tablet 650 mg  650 mg Oral Q6H PRN Lurena Nida, NP   650 mg at 10/13/15 1115  . hydrOXYzine (ATARAX/VISTARIL) tablet 50 mg  50 mg Oral Q6H PRN Nanci Pina, FNP   50 mg at 10/13/15 2128  . lamoTRIgine (LAMICTAL) tablet 25 mg  25 mg Oral Daily  Merian Capron, MD   25 mg at 10/13/15 1230  . loratadine (CLARITIN) tablet 10 mg  10 mg Oral Daily Lurena Nida, NP   10 mg at 10/13/15 1551  . metoprolol succinate (TOPROL-XL) 24 hr tablet 25 mg  25 mg Oral Daily Lurena Nida, NP   25 mg at 10/13/15 1552  . mometasone-formoterol (DULERA) 200-5 MCG/ACT inhaler 2 puff  2 puff Inhalation BID Lurena Nida, NP   2 puff at 10/13/15 0800  . pantoprazole (PROTONIX) EC tablet 40 mg  40 mg Oral BID Lurena Nida, NP   40 mg at 10/13/15 1551  . rosuvastatin (CRESTOR) tablet 10 mg  10 mg Oral Daily Lurena Nida, NP   10 mg at 10/13/15 1551  . traZODone (DESYREL) tablet 50 mg  50 mg Oral QHS PRN Lurena Nida, NP   50 mg at 10/13/15 2129    Lab Results:  Results for orders placed or performed during  the hospital encounter of 10/12/15 (from the past 48 hour(s))  Comprehensive metabolic panel     Status: Abnormal   Collection Time: 10/12/15  5:15 PM  Result Value Ref Range   Sodium 142 135 - 145 mmol/L   Potassium 3.6 3.5 - 5.1 mmol/L   Chloride 110 101 - 111 mmol/L   CO2 23 22 - 32 mmol/L   Glucose, Bld 99 65 - 99 mg/dL   BUN 17 6 - 20 mg/dL   Creatinine, Ser 1.14 0.61 - 1.24 mg/dL   Calcium 9.2 8.9 - 10.3 mg/dL   Total Protein 7.3 6.5 - 8.1 g/dL   Albumin 4.7 3.5 - 5.0 g/dL   AST 47 (H) 15 - 41 U/L   ALT 59 17 - 63 U/L   Alkaline Phosphatase 70 38 - 126 U/L   Total Bilirubin 1.4 (H) 0.3 - 1.2 mg/dL   GFR calc non Af Amer >60 >60 mL/min   GFR calc Af Amer >60 >60 mL/min    Comment: (NOTE) The eGFR has been calculated using the CKD EPI equation. This calculation has not been validated in all clinical situations. eGFR's persistently <60 mL/min signify possible Chronic Kidney Disease.    Anion gap 9 5 - 15  Ethanol     Status: None   Collection Time: 10/12/15  5:15 PM  Result Value Ref Range   Alcohol, Ethyl (B) <5 <5 mg/dL    Comment:        LOWEST DETECTABLE LIMIT FOR SERUM ALCOHOL IS 5 mg/dL FOR MEDICAL PURPOSES ONLY   Salicylate level     Status: None   Collection Time: 10/12/15  5:15 PM  Result Value Ref Range   Salicylate Lvl <0.5 2.8 - 30.0 mg/dL  Acetaminophen level     Status: Abnormal   Collection Time: 10/12/15  5:15 PM  Result Value Ref Range   Acetaminophen (Tylenol), Serum <10 (L) 10 - 30 ug/mL    Comment:        THERAPEUTIC CONCENTRATIONS VARY SIGNIFICANTLY. A RANGE OF 10-30 ug/mL MAY BE AN EFFECTIVE CONCENTRATION FOR MANY PATIENTS. HOWEVER, SOME ARE BEST TREATED AT CONCENTRATIONS OUTSIDE THIS RANGE. ACETAMINOPHEN CONCENTRATIONS >150 ug/mL AT 4 HOURS AFTER INGESTION AND >50 ug/mL AT 12 HOURS AFTER INGESTION ARE OFTEN ASSOCIATED WITH TOXIC REACTIONS.   cbc     Status: None   Collection Time: 10/12/15  5:15 PM  Result Value Ref Range   WBC  9.1 4.0 - 10.5 K/uL   RBC 5.45 4.22 - 5.81  MIL/uL   Hemoglobin 16.3 13.0 - 17.0 g/dL   HCT 46.2 39.0 - 52.0 %   MCV 84.8 78.0 - 100.0 fL   MCH 29.9 26.0 - 34.0 pg   MCHC 35.3 30.0 - 36.0 g/dL   RDW 13.3 11.5 - 15.5 %   Platelets 157 150 - 400 K/uL  Rapid urine drug screen (hospital performed)     Status: None   Collection Time: 10/13/15  1:01 AM  Result Value Ref Range   Opiates NONE DETECTED NONE DETECTED   Cocaine NONE DETECTED NONE DETECTED   Benzodiazepines NONE DETECTED NONE DETECTED   Amphetamines NONE DETECTED NONE DETECTED   Tetrahydrocannabinol NONE DETECTED NONE DETECTED   Barbiturates NONE DETECTED NONE DETECTED    Comment:        DRUG SCREEN FOR MEDICAL PURPOSES ONLY.  IF CONFIRMATION IS NEEDED FOR ANY PURPOSE, NOTIFY LAB WITHIN 5 DAYS.        LOWEST DETECTABLE LIMITS FOR URINE DRUG SCREEN Drug Class       Cutoff (ng/mL) Amphetamine      1000 Barbiturate      200 Benzodiazepine   016 Tricyclics       553 Opiates          300 Cocaine          300 THC              50     Blood Alcohol level:  Lab Results  Component Value Date   ETH <5 10/12/2015    Physical Findings: AIMS: Facial and Oral Movements Muscles of Facial Expression: None, normal Lips and Perioral Area: None, normal Jaw: None, normal Tongue: None, normal,Extremity Movements Upper (arms, wrists, hands, fingers): None, normal Lower (legs, knees, ankles, toes): None, normal, Trunk Movements Neck, shoulders, hips: None, normal, Overall Severity Severity of abnormal movements (highest score from questions above): None, normal Incapacitation due to abnormal movements: None, normal Patient's awareness of abnormal movements (rate only patient's report): No Awareness, Dental Status Current problems with teeth and/or dentures?: No Does patient usually wear dentures?: No  CIWA:  CIWA-Ar Total: 0 COWS:  COWS Total Score: 6  Musculoskeletal: Strength & Muscle Tone: within normal limits Gait &  Station: normal Patient leans: no lean  Psychiatric Specialty Exam: Physical Exam  Constitutional: He appears well-developed.  HENT:  Head: Normocephalic.    Review of Systems  Cardiovascular: Negative for chest pain.  Neurological: Negative for tremors.  Psychiatric/Behavioral: Negative for suicidal ideas and substance abuse.    Blood pressure 120/72, pulse 79, temperature 98.1 F (36.7 C), temperature source Oral, resp. rate 20, height 5' 11"  (1.803 m), weight 85.73 kg (189 lb), SpO2 99 %.Body mass index is 26.37 kg/(m^2).  General Appearance: Casual  Eye Contact:  Fair  Speech:  Normal Rate  Volume:  Decreased  Mood:  Dysphoric  Affect:  Congruent  Thought Process:  Goal Directed  Orientation:  Full (Time, Place, and Person)  Thought Content:  Logical  Suicidal Thoughts:  No  Homicidal Thoughts:  No  Memory:  Immediate;   Fair Recent;   Fair  Judgement:  Fair  Insight:  Shallow  Psychomotor Activity:  Normal  Concentration:  Concentration: Fair and Attention Span: Fair  Recall:  [employer] of Knowledge:  Fair  Language:  Fair  Akathisia:  Negative  Handed:  Right  AIMS (if indicated):     Assets:  Desire for Improvement  ADL's:  Intact  Cognition:  WNL  Sleep:  Number of Hours: 6     Treatment Plan Summary: Daily contact with patient to assess and evaluate symptoms and progress in treatment, Medication management and Plan as follows  Major depression: have been on SSRI and other meds. Non compliant . Tolerated lamictal its a small dose 2m.  Remains reluctant to be on other meds.  Mood improved  Not suicidal Denies drug use Continue to attend groups  Patient talking to his wife and not having any confrontation  Monitor vitals and progress   AMerian Capron MD 10/14/2015, 11:51 AM

## 2015-10-15 ENCOUNTER — Encounter (HOSPITAL_COMMUNITY): Payer: Self-pay | Admitting: Nurse Practitioner

## 2015-10-15 MED ORDER — METOPROLOL TARTRATE 50 MG PO TABS
25.0000 mg | ORAL_TABLET | ORAL | Status: DC
  Administered 2015-10-15 – 2015-10-17 (×2): 25 mg via ORAL

## 2015-10-15 MED ORDER — ALBUTEROL SULFATE HFA 108 (90 BASE) MCG/ACT IN AERS
2.0000 | INHALATION_SPRAY | Freq: Four times a day (QID) | RESPIRATORY_TRACT | Status: DC
  Administered 2015-10-16: 2 via RESPIRATORY_TRACT

## 2015-10-15 MED ORDER — FLUTICASONE PROPIONATE 50 MCG/ACT NA SUSP
2.0000 | Freq: Every day | NASAL | Status: DC
Start: 2015-10-15 — End: 2015-10-19

## 2015-10-15 MED ORDER — BUDESONIDE-FORMOTEROL FUMARATE 160-4.5 MCG/ACT IN AERO
2.0000 | INHALATION_SPRAY | Freq: Two times a day (BID) | RESPIRATORY_TRACT | Status: DC
  Administered 2015-10-15 – 2015-10-19 (×8): 2 via RESPIRATORY_TRACT

## 2015-10-15 MED ORDER — CETIRIZINE HCL 10 MG PO TABS
10.0000 mg | ORAL_TABLET | Freq: Every day | ORAL | Status: DC
  Administered 2015-10-15 – 2015-10-19 (×5): 10 mg via ORAL
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
## 2015-10-15 NOTE — [facility] Group Notes (Signed)
[facility] LCSW Group Therapy  10/15/2015 1:15pm  Type of Therapy:  Group Therapy vercoming Obstacles  Pt did not attend, declined invitation.   Peri Maris, [place] 10/15/2015 3:32 PM

## 2015-10-15 NOTE — Plan of Care (Signed)
Problem: Education: Goal: Ability to make informed decisions regarding treatment will improve Outcome: Progressing Nurse discussed suicidal thoughts/depression/coping skills with patient.

## 2015-10-15 NOTE — Tx Team (Signed)
Interdisciplinary Treatment Plan Update (Adult) Date: 10/15/2015   Date: 10/15/2015 10:15 AM  Progress in Treatment:  Attending groups: No Participating in groups: No  Taking medication as prescribed: Yes  Tolerating medication: Yes  Family/Significant othe contact made: No, CSW attempting to make contact with wife Patient understands diagnosis: Continuing to assess  Discussing patient identified problems/goals with staff: Yes  Medical problems stabilized or resolved: Yes  Denies suicidal/homicidal ideation: Yes Patient has not harmed self or Others: Yes   New problem(s) identified: None identified at this time.   Discharge Plan or Barriers: Pt will return home and [facility]  Additional comments:  Patient and CSW reviewed pt's identified goals and treatment plan. Patient verbalized understanding and agreed to treatment plan.   Reason for Continuation of Hospitalization:  Depression Medication stabilization  Estimated length of stay: 3-5 days  Review of initial/current patient goals per problem list:   1.  Goal(s): Patient will participate in aftercare plan  Met:  Yes  Target date: 3-5 days from date of admission   As evidenced by: Patient will participate within aftercare plan AEB aftercare provider and housing plan at discharge being identified.   10/15/15:  Pt will return home and [facility] with [facility]  2.  Goal (s): Patient will exhibit decreased depressive symptoms and suicidal ideations.  Met:  Yes  Target date: 3-5 days from date of admission   As evidenced by: Patient will utilize self rating of depression at 3 or below and demonstrate decreased signs of depression or be deemed stable for discharge by MD.  10/15/15: Pt rates depression at 3/10; denies SI  Attendees:  Patient:    Family:    Physician: Dr. Parke Poisson, MD  10/15/2015 10:15 AM  Nursing: Grayland Ormond, RN 10/15/2015 10:15 AM  Clinical Social Worker Peri Maris, [place] 10/15/2015  10:15 AM  Other: Erasmo Downer Drinkard, [place] 10/15/2015 10:15 AM  Clinical:  10/15/2015 10:15 AM  Other:  10/15/2015 10:15 AM  Other:     Peri Maris, [place] Work [phone]

## 2015-10-15 NOTE — [facility] Group Notes (Signed)
Pt attended spiritual care group on grief and loss facilitated by chaplain Hisao Doo   Group opened with brief discussion and psycho-social ed around grief and loss in relationships and in relation to self - identifying life patterns, circumstances, changes that cause losses. Established group norm of speaking from own life experience. Group goal of establishing open and affirming space for members to share loss and experience with grief, normalize grief experience and provide psycho social education and grief support.     

## 2015-10-15 NOTE — Progress Notes (Signed)
Patient stated he will be discharged tomorrow after talking to the MD.  Stated he needs to follow up with his outpatient therapy.

## 2015-10-15 NOTE — Progress Notes (Signed)
Recreation Therapy Notes  Date: 06.19.2017 Time: 9:30am Location: [address] Group Room   Group Topic: Stress Management  Goal Area(s) Addresses:  Patient will actively participate in stress management techniques presented during session.   Behavioral Response: Engaged, Attentive   Intervention: Stress management techniques  Activity :  Deep Breathing, Progressive Body Relaxation and Progressive Body Scan. LRT provided education, instruction and demonstration on practice of Deep Breathing, Progressive Body Relaxation and Progressive Body Scan. Patient was asked to participate in technique introduced during session.   Education:  Stress Management, Discharge Planning.   Education Outcome: Acknowledges education  Clinical Observations/Feedback: Patient actively engaged in technique introduced, expressed no concerns and demonstrated ability to practice independently post d/c.   Laureen Ochs Shelton Soler, LRT/CTRS        Tinaya Ceballos L 10/15/2015 10:20 AM

## 2015-10-15 NOTE — Progress Notes (Signed)
[facility] MD Progress Note  10/15/2015 3:31 PM Arthur Moore  MRN:  [id number] Subjective:  Alert oriented.  Patient concerned when he will be going home.  He states that he has tried different meds and weaned self as they were "inffective"; been diagnosed being Bipolar and personality disorder but patient does not agree.  He stated that the police came and placed IVC because he told his wife that she would be better off if he was not alive.    Patient was admitted with depression and waved a gun to police when they came. Says he has now talked to wife and their is no animosity. Lamictal was started yesterday.  Feels better, less depressed. States have no intent to harm himself  Wants to seek help with [facility] No manic symptoms. No disruptive behaviour today  Collateral information provided by wife, it was reported that wife was recently diagnosed with cancer ad that patient had been verbally abusive towards her and that he was not supportive of her.  That patient is no compliant with meds and resistance to treatment.    Principal Problem: MDD (major depressive disorder), recurrent severe, without psychosis ([place]) Diagnosis:   Patient Active Problem List   Diagnosis Date Noted  . MDD (major depressive disorder), recurrent severe, without psychosis ([place]) [F33.2] 10/13/2015    Priority: High  . Hyperlipidemia [E78.5] 08/24/2015  . Arthritis [M19.90]   . Depression [F32.9]   . Hypertension [I10]   . Aortic insufficiency [I35.1]   . Psoriasis [L40.9]   . Cervical disc disease [M50.90]   . Lumbar disc disease [M51.9]   . Asthma [J45.909] 01/06/2015  . Psoriatic arthritis ([place]) [L40.50] 01/06/2015  . 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 GERD (gastroesophageal reflux disease) [K21.9] 01/06/2015  . 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 Arthritis   . Depression   . Hypertension   . Aortic insufficiency   . Psoriasis   . Cervical disc disease   . Asthma   . Lumbar disc disease   . Hyperlipidemia 08/24/2015    Past Surgical History  Procedure Laterality Date  . Hernia repair    . Cholecystectomy  2012  . Sinus exploration  2013  . Parotidectomy Right 1992  . Prostate surgery  2002, 2004  . Spine surgery  2013    L C7-T1 laminectomy and discectomy   Family History:  Family History  Problem Relation Age of Onset  . Colon cancer Mother   . Lung cancer Father     Social History:  History  Alcohol Use No     History  Drug Use No    Social History   Social History  . Marital Status: Married    Spouse Name: N/A  . Number of Children: N/A  . Years of Education: N/A   Social History Main Topics  . Smoking status: Never Smoker   . Smokeless tobacco: None  . Alcohol Use: No  . Drug Use: No  . Sexual Activity: Not Asked   Other Topics Concern  . None   Social History Narrative   Additional Social History:    History of alcohol / drug use?: No history of alcohol / drug abuse  Sleep: Fair  Appetite:  Fair  Current Medications: Current Facility-Administered Medications  Medication Dose Route Frequency Provider Last Rate Last Dose  .  acetaminophen (TYLENOL) tablet 650 mg  650 mg Oral Q6H PRN Lurena Nida, NP   650 mg at 10/13/15 1115  . albuterol (PROVENTIL HFA;VENTOLIN HFA) 108 (90 Base) MCG/ACT inhaler 2 puff  2 puff Inhalation QID Jenne Campus, MD   2 puff at 10/15/15 1245  . budesonide-formoterol (SYMBICORT) 160-4.5 MCG/ACT inhaler 2 puff  2 puff Inhalation BID Jenne Campus, MD   2 puff at 10/15/15 1215  . cetirizine (ZYRTEC) tablet 10 mg  10 mg Oral Daily Jenne Campus, MD   10 mg at 10/15/15 1245  . fluticasone (FLONASE) 50 MCG/ACT nasal spray 2 spray  2 spray Each Nare Daily Jenne Campus, MD   2 spray at 10/15/15 1245  . hydrOXYzine  (ATARAX/VISTARIL) tablet 50 mg  50 mg Oral Q6H PRN Nanci Pina, FNP   50 mg at 10/13/15 2128  . lamoTRIgine (LAMICTAL) tablet 25 mg  25 mg Oral Daily Merian Capron, MD   25 mg at 10/15/15 0906  . metoprolol (LOPRESSOR) tablet 25 mg  25 mg Oral QODAY Myer Peer Cobos, MD   25 mg at 10/15/15 1245  . pantoprazole (PROTONIX) EC tablet 40 mg  40 mg Oral BID Jenne Campus, MD   40 mg at 10/15/15 0900  . rosuvastatin (CRESTOR) tablet 10 mg  10 mg Oral Daily Jenne Campus, MD   10 mg at 10/15/15 0900  . traZODone (DESYREL) tablet 50 mg  50 mg Oral QHS PRN Lurena Nida, NP   50 mg at 10/13/15 2129    Lab Results:  No results found for this or any previous visit (from the past 48 hour(s)).  Blood Alcohol level:  Lab Results  Component Value Date   ETH <5 10/12/2015    Physical Findings: AIMS: Facial and Oral Movements Muscles of Facial Expression: None, normal Lips and Perioral Area: None, normal Jaw: None, normal Tongue: None, normal,Extremity Movements Upper (arms, wrists, hands, fingers): None, normal Lower (legs, knees, ankles, toes): None, normal, Trunk Movements Neck, shoulders, hips: None, normal, Overall Severity Severity of abnormal movements (highest score from questions above): None, normal Incapacitation due to abnormal movements: None, normal Patient's awareness of abnormal movements (rate only patient's report): No Awareness, Dental Status Current problems with teeth and/or dentures?: No Does patient usually wear dentures?: No  CIWA:  CIWA-Ar Total: 1 COWS:  COWS Total Score: 1  Musculoskeletal: Strength & Muscle Tone: within normal limits Gait & Station: normal Patient leans: no lean  Psychiatric Specialty Exam: Physical Exam  Vitals reviewed. Constitutional: He appears well-developed.  HENT:  Head: Normocephalic.  Psychiatric: His mood appears anxious. Thought content is not paranoid. He exhibits a depressed mood. He expresses no homicidal and no suicidal  ideation.    Review of Systems  Cardiovascular: Negative for chest pain.  Neurological: Negative for tremors.  Psychiatric/Behavioral: Negative for suicidal ideas and substance abuse.    Blood pressure 116/73, pulse 68, temperature 98 F (36.7 C), temperature source Oral, resp. rate 18, height 5\' 11"  (1.803 m), weight 85.73 kg (189 lb), SpO2 99 %.Body mass index is 26.37 kg/(m^2).  General Appearance: Casual  Eye Contact:  Fair  Speech:  Normal Rate  Volume:  Decreased  Mood:  Dysphoric  Affect:  Congruent  Thought Process:  Goal Directed  Orientation:  Full (Time, Place, and Person)  Thought Content:  Logical  Suicidal Thoughts:  No  Homicidal Thoughts:  No  Memory:  Immediate;   Fair Recent;   Fair  Judgement:  Fair  Insight:  Shallow  Psychomotor Activity:  Normal  Concentration:  Concentration: Fair and Attention Span: Fair  Recall:  [employer] of Knowledge:  Fair  Language:  Fair  Akathisia:  Negative  Handed:  Right  AIMS (if indicated):     Assets:  Desire for Improvement  ADL's:  Intact  Cognition:  WNL  Sleep:  Number of Hours: 4.75   Treatment Plan Summary: Daily contact with patient to assess and evaluate symptoms and progress in treatment, Medication management and Plan as follows  Major depression: have been on SSRI and other meds. Non compliant . Tolerated lamictal its a small dose 25mg .  Remains reluctant to be on other meds.  Mood improved  Not suicidal Denies drug use Continue to attend groups  Patient talking to his wife and not having any confrontation  Monitor vitals and progress  Janett Labella, NP 10/15/2015, 3:31 PM

## 2015-10-15 NOTE — Progress Notes (Addendum)
D:  Patient's self inventory sheet, patient has fair sleep, no sleep medication given.  Fair appetite, normal energy level, good concentration.  Rated depression and hopeless #2, denied anxiety.  Denied withdrawals.  Denied SI.  Denied physical problems.  Denied pain.  Goal is to discharge.  Plans to attend groups.  Does have discharge plans. A:  Emotional support and encouragement given patient. R:  Denied SI and HI, contracts for safety.  Denied A/V hallucinations.  Safety maintained with 15 minute checks. Patient's wife brought in his home medications which he wants to take and not the hospital's medications.   Zyrtec 10 mg qd,   Pantoprazole 40 mg qd,   Metoprolol 25 mg every other day,   symbicort 2 puffs qd,   dulera inhaler is prn at home.  Nurse discussed home medication/[facility] medications with pharmacst and NP.

## 2015-10-15 NOTE — [facility] Suicide Risk Assessment (Signed)
Arthur Moore INPATIENT:  Family/Significant Other Suicide Prevention Education  Suicide Prevention Education:  Education Completed; Ledarrius Rosati, Pt's wife [phone], has been identified by the patient as the family member/significant other with whom the patient will be residing, and identified as the person(s) who will aid the patient in the event of a mental health crisis (suicidal ideations/suicide attempt).  With written consent from the patient, the family member/significant other has been provided the following suicide prevention education, prior to the and/or following the discharge of the patient.  The suicide prevention education provided includes the following:  Suicide risk factors  Suicide prevention and interventions  National Suicide Hotline telephone number  [facility] assessment telephone number  [facility] Emergency Assistance [place] and/or Residential Mobile Crisis Unit telephone number  Request made of family/significant other to:  Remove weapons (e.g., guns, rifles, knives), all items previously/currently identified as safety concern.    Remove drugs/medications (over-the-counter, prescriptions, illicit drugs), all items previously/currently identified as a safety concern.  The family member/significant other verbalizes understanding of the suicide prevention education information provided.  The family member/significant other agrees to remove the items of safety concern listed above.  Pt's wife expressed concern regarding Pt's mental state. Per wife, the night prior to admission, Pt expressed that he was going to kill himself and that it was not a matter of "if" but a matter of "when and how." Pt has become increasingly aggressive verbally and has discontinued his medicine multiple times. Pt's wife reports that Pt is resistant to treatment and has acted professionally so is often able to mask his emotions.  Bo Mcclintock 10/15/2015,  1:06 PM

## 2015-10-16 MED ORDER — IBUPROFEN 800 MG PO TABS
800.0000 mg | ORAL_TABLET | Freq: Three times a day (TID) | ORAL | Status: DC | PRN
  Administered 2015-10-16 – 2015-10-18 (×4): 800 mg via ORAL
  Filled 2015-10-16 (×4): qty 1

## 2015-10-16 MED ORDER — LAMOTRIGINE 25 MG PO TABS
25.0000 mg | ORAL_TABLET | Freq: Two times a day (BID) | ORAL | Status: DC
  Administered 2015-10-16 – 2015-10-19 (×5): 25 mg via ORAL
  Filled 2015-10-16 (×10): qty 1

## 2015-10-16 NOTE — Progress Notes (Signed)
Patient ID: Arthur Moore, male   DOB: 12/21/1950, 65 y.o.   MRN: [id number] [facility] MD Progress Note  10/16/2015 1:07 PM Arthur Moore  MRN:    HPI: Patient was seen for the first time today by this clinician. Patient is a 65 year old Caucasian male who was involuntarily committed by his wife or waving a gun at home and also telling his wife that he was better off not being alive. This morning patient denies any suicidal thoughts. He reports some relational issues with wife. States he has never slept well and has some sleep apnea. States his been eating okay. Patient not very forthcoming about what actually led him to being hospitalized. He denies any suicidal thoughts. He has been cooperative on the unit. No manic symptoms. No disruptive behaviour today  Principal Problem: MDD (major depressive disorder), recurrent severe, without psychosis ([place]) Diagnosis:   Patient Active Problem List   Diagnosis Date Noted  . MDD (major depressive disorder), recurrent severe, without psychosis ([place]) [F33.2] 10/13/2015  . Hyperlipidemia [E78.5] 08/24/2015  . Arthritis [M19.90]   . Depression [F32.9]   . Hypertension [I10]   . Aortic insufficiency [I35.1]   . Psoriasis [L40.9]   . Cervical disc disease [M50.90]   . Lumbar disc disease [M51.9]   . Asthma [J45.909] 01/06/2015  . Psoriatic arthritis ([place]) [L40.50] 01/06/2015  . 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 GERD (gastroesophageal reflux disease) [K21.9] 01/06/2015  . 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 Arthritis   . Depression   . Hypertension   . Aortic insufficiency   . Psoriasis   . Cervical disc disease   . Asthma   . Lumbar disc disease   . Hyperlipidemia 08/24/2015    Past Surgical History  Procedure Laterality Date  . Hernia repair    .  Cholecystectomy  2012  . Sinus exploration  2013  . Parotidectomy Right 1992  . Prostate surgery  2002, 2004  . Spine surgery  2013    L C7-T1 laminectomy and discectomy   Family History:  Family History  Problem Relation Age of Onset  . Colon cancer Mother   . Lung cancer Father     Social History:  History  Alcohol Use No     History  Drug Use No    Social History   Social History  . Marital Status: Married    Spouse Name: N/A  . Number of Children: N/A  . Years of Education: N/A   Social History Main Topics  . Smoking status: Never Smoker   . Smokeless tobacco: None  . Alcohol Use: No  . Drug Use: No  . Sexual Activity: Not Asked   Other Topics Concern  . None   Social History Narrative   Additional Social History:    History of alcohol / drug use?: No history of alcohol / drug abuse  Sleep: Fair  Appetite:  Fair  Current Medications: Current Facility-Administered Medications  Medication Dose Route Frequency Provider Last Rate Last Dose  . acetaminophen (TYLENOL) tablet 650 mg  650 mg Oral Q6H PRN Lurena Nida, NP   650 mg at 10/16/15 0844  . albuterol (PROVENTIL HFA;VENTOLIN HFA) 108 (90 Base) MCG/ACT inhaler 2 puff  2 puff Inhalation QID Jenne Campus, MD   2 puff at 10/15/15 1245  . budesonide-formoterol (SYMBICORT) 160-4.5 MCG/ACT inhaler 2  puff  2 puff Inhalation BID Jenne Campus, MD   2 puff at 10/16/15 0825  . cetirizine (ZYRTEC) tablet 10 mg  10 mg Oral Daily Jenne Campus, MD   10 mg at 10/16/15 0827  . fluticasone (FLONASE) 50 MCG/ACT nasal spray 2 spray  2 spray Each Nare Daily Jenne Campus, MD   2 spray at 10/15/15 1245  . hydrOXYzine (ATARAX/VISTARIL) tablet 50 mg  50 mg Oral Q6H PRN Nanci Pina, FNP   50 mg at 10/13/15 2128  . lamoTRIgine (LAMICTAL) tablet 25 mg  25 mg Oral Daily Merian Capron, MD   25 mg at 10/16/15 0827  . metoprolol (LOPRESSOR) tablet 25 mg  25 mg Oral QODAY Myer Peer Cobos, MD   25 mg at 10/15/15 1245   . pantoprazole (PROTONIX) EC tablet 40 mg  40 mg Oral BID Jenne Campus, MD   40 mg at 10/16/15 [id number]  . rosuvastatin (CRESTOR) tablet 10 mg  10 mg Oral Daily Jenne Campus, MD   10 mg at 10/16/15 0829  . traZODone (DESYREL) tablet 50 mg  50 mg Oral QHS PRN Lurena Nida, NP   50 mg at 10/13/15 2129    Lab Results:  No results found for this or any previous visit (from the past 48 hour(s)).  Blood Alcohol level:  Lab Results  Component Value Date   ETH <5 10/12/2015    Physical Findings: AIMS: Facial and Oral Movements Muscles of Facial Expression: None, normal Lips and Perioral Area: None, normal Jaw: None, normal Tongue: None, normal,Extremity Movements Upper (arms, wrists, hands, fingers): None, normal Lower (legs, knees, ankles, toes): None, normal, Trunk Movements Neck, shoulders, hips: None, normal, Overall Severity Severity of abnormal movements (highest score from questions above): None, normal Incapacitation due to abnormal movements: None, normal Patient's awareness of abnormal movements (rate only patient's report): No Awareness, Dental Status Current problems with teeth and/or dentures?: No Does patient usually wear dentures?: No  CIWA:  CIWA-Ar Total: 1 COWS:  COWS Total Score: 2  Musculoskeletal: Strength & Muscle Tone: within normal limits Gait & Station: normal Patient leans: no lean  Psychiatric Specialty Exam: Physical Exam  Vitals reviewed. Constitutional: He appears well-developed.  HENT:  Head: Normocephalic.  Psychiatric: His mood appears anxious. Thought content is not paranoid. He exhibits a depressed mood. He expresses no homicidal and no suicidal ideation.    Review of Systems  Cardiovascular: Negative for chest pain.  Neurological: Negative for tremors.  Psychiatric/Behavioral: Negative for suicidal ideas and substance abuse.    Blood pressure 123/83, pulse 85, temperature 97.9 F (36.6 C), temperature source Oral, resp. rate 18,  height 5\' 11"  (1.803 m), weight 189 lb (85.73 kg), SpO2 99 %.Body mass index is 26.37 kg/(m^2).  General Appearance: Casual  Eye Contact:  Fair  Speech:  Normal Rate  Volume:  Decreased  Mood:  Dysphoric  Affect:  Congruent  Thought Process:  Goal Directed  Orientation:  Full (Time, Place, and Person)  Thought Content:  Logical  Suicidal Thoughts:  No  Homicidal Thoughts:  No  Memory:  Immediate;   Fair Recent;   Fair  Judgement:  Fair  Insight:  Shallow  Psychomotor Activity:  Normal  Concentration:  Concentration: Fair and Attention Span: Fair  Recall:  [employer] of Knowledge:  Fair  Language:  Fair  Akathisia:  Negative  Handed:  Right  AIMS (if indicated):     Assets:  Desire for Improvement  ADL's:  Intact  Cognition:  WNL  Sleep:  Number of Hours: 6.5   Treatment Plan Summary: Daily contact with patient to assess and evaluate symptoms and progress in treatment, Medication management and Plan as follows  Major depression: have been on SSRI and other meds. Non compliant .  Will increase Lamictal to 25 mg twice daily Remains reluctant to be on other meds.  Mood improved  Not suicidal Denies drug use Continue to attend groups  Monitor vitals and progress  Elvin So, MD 10/16/2015, 1:07 PM

## 2015-10-16 NOTE — B[facility] Group Notes (Signed)
[place] LCSW Group Therapy 10/16/2015 1:15 PM  Type of Therapy: Group Therapy- Feelings about Diagnosis  Pt did not attend, declined invitation.   Peri Maris, [place] 10/16/2015 2:59 PM

## 2015-10-16 NOTE — Progress Notes (Signed)
Patient did not attend wrap up group. 

## 2015-10-16 NOTE — Progress Notes (Signed)
D:  Patient's self inventory sheet, patient has poor sleep, no sleep medication given.  Fair appetite, normal energy level, good concentration.  Rated depression 2, denied hopelessness and anxiety.  Denied withdrawals.  Denied SI.  Denied physical problems.  Denied pain.  Goal is positive mental attitude.  Plans to talk to people, eat well, exercise.  Does have discharge plans. A:  Medications administered per MD orders  Emotional support and encouragement given patient. R:  Denied SI and HI, contracts for safety.  Denied A/V hallucinations.  Safety maintained with 15 minute checks.

## 2015-10-16 NOTE — Plan of Care (Signed)
Problem: Education: Goal: Utilization of techniques to improve thought processes will improve Outcome: Progressing Nurse discussed depression/coping skills with patient.    

## 2015-10-16 NOTE — Progress Notes (Signed)
Recreation Therapy Notes  Animal-Assisted Activity (AAA) Program Checklist/Progress Notes Patient Eligibility Criteria Checklist & Daily Group note for Rec Tx Intervention  Date: 06.20.2017 Time: 2:45pm Location: [address]    AAA/T Program Assumption of Risk Form signed by Patient/ or Parent Legal Guardian Yes  Patient is free of allergies or sever asthma Yes  Patient reports no fear of animals Yes  Patient reports no history of cruelty to animals Yes  Patient understands his/her participation is voluntary Yes  Behavioral Response: Did not attend.   Laureen Ochs Kalee Broxton, LRT/CTRS       Lane Hacker 10/16/2015 2:59 PM

## 2015-10-16 NOTE — B[facility] Group Notes (Signed)

## 2015-10-17 MED ORDER — QUETIAPINE FUMARATE 25 MG PO TABS
25.0000 mg | ORAL_TABLET | Freq: Two times a day (BID) | ORAL | Status: DC
  Administered 2015-10-18: 25 mg via ORAL
  Filled 2015-10-17 (×9): qty 1

## 2015-10-17 NOTE — Progress Notes (Signed)
Patient ID: Arthur Moore, male   DOB: 1950-12-15, 65 y.o.   MRN: [id number]  DAR: Pt. Denies SI/HI and A/V Hallucinations. He reports sleep is good, appetite is good, energy level is normal, and concentration is poor. He rates depression 1/10, hopelessness 0/10, and anxiety 0/10. Patient reports a stiff neck and is receiving PRN Ibuprofen for this. He is irritable throughout the day. He comes to the medication window and attends meals but is otherwise seen in his bed throughout the day. Support and encouragement provided to the patient. Patient continues to refuse some medications he states, "I am not refusing that med (Seroquel) I am just not going to take a medication without a doctor talking to me first." Patient reports he is ready to go home. Treatment team notified of this. Q15 minute checks are maintained for safety.

## 2015-10-17 NOTE — Progress Notes (Signed)
Patient ID: Arthur Moore, male   DOB: Sep 24, 1950, 65 y.o.   MRN: [id number] [facility] MD Progress Note  10/17/2015 10:13 AM Arthur Moore  MRN:    HPI: Patient was seen today and chart reviewed. Patient was also discussed in treatment team. Patient presents very irritable today and this is supported but the rest of the treatment team. He continues to endorse that he needs to go home so he can stay in his bed and sleep better. He is not interested in medication management or in part spitting with groups. Patient was told that since he continues to be irritable and has not made any improvement in his mood we will not be able to discharge him today. We also discussed with him that we will confer with his wife prior to discharge. atient not very forthcoming about what actually led him to being hospitalized. He denies any suicidal thoughts. He has been cooperative on the unit. No manic symptoms. No disruptive behaviour today.   Principal Problem: MDD (major depressive disorder), recurrent severe, without psychosis ([place]) Diagnosis:   Patient Active Problem List   Diagnosis Date Noted  . MDD (major depressive disorder), recurrent severe, without psychosis ([place]) [F33.2] 10/13/2015  . Hyperlipidemia [E78.5] 08/24/2015  . Arthritis [M19.90]   . Depression [F32.9]   . Hypertension [I10]   . Aortic insufficiency [I35.1]   . Psoriasis [L40.9]   . Cervical disc disease [M50.90]   . Lumbar disc disease [M51.9]   . Asthma [J45.909] 01/06/2015  . Psoriatic arthritis ([place]) [L40.50] 01/06/2015  . 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 GERD (gastroesophageal reflux disease) [K21.9] 01/06/2015  . 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 Arthritis   . Depression   . Hypertension   . Aortic insufficiency   . Psoriasis   .  Cervical disc disease   . Asthma   . Lumbar disc disease   . Hyperlipidemia 08/24/2015    Past Surgical History  Procedure Laterality Date  . Hernia repair    . Cholecystectomy  2012  . Sinus exploration  2013  . Parotidectomy Right 1992  . Prostate surgery  2002, 2004  . Spine surgery  2013    L C7-T1 laminectomy and discectomy   Family History:  Family History  Problem Relation Age of Onset  . Colon cancer Mother   . Lung cancer Father     Social History:  History  Alcohol Use No     History  Drug Use No    Social History   Social History  . Marital Status: Married    Spouse Name: N/A  . Number of Children: N/A  . Years of Education: N/A   Social History Main Topics  . Smoking status: Never Smoker   . Smokeless tobacco: None  . Alcohol Use: No  . Drug Use: No  . Sexual Activity: Not Asked   Other Topics Concern  . None   Social History Narrative   Additional Social History:    History of alcohol / drug use?: No history of alcohol / drug abuse  Sleep: Fair  Appetite:  Fair  Current Medications: Current Facility-Administered Medications  Medication Dose Route Frequency Provider Last Rate Last Dose  . acetaminophen (TYLENOL) tablet 650 mg  650 mg Oral Q6H PRN Lurena Nida, NP   650 mg at 10/16/15 2051  . albuterol (  PROVENTIL HFA;VENTOLIN HFA) 108 (90 Base) MCG/ACT inhaler 2 puff  2 puff Inhalation QID Jenne Campus, MD   2 puff at 10/16/15 2024  . budesonide-formoterol (SYMBICORT) 160-4.5 MCG/ACT inhaler 2 puff  2 puff Inhalation BID Jenne Campus, MD   2 puff at 10/17/15 0845  . cetirizine (ZYRTEC) tablet 10 mg  10 mg Oral Daily Jenne Campus, MD   10 mg at 10/17/15 0843  . fluticasone (FLONASE) 50 MCG/ACT nasal spray 2 spray  2 spray Each Nare Daily Jenne Campus, MD   2 spray at 10/15/15 1245  . hydrOXYzine (ATARAX/VISTARIL) tablet 50 mg  50 mg Oral Q6H PRN Nanci Pina, FNP   50 mg at 10/16/15 2051  . ibuprofen (ADVIL,MOTRIN) tablet  800 mg  800 mg Oral Q8H PRN Niel Hummer, NP   800 mg at 10/17/15 0842  . lamoTRIgine (LAMICTAL) tablet 25 mg  25 mg Oral BID Shawntae Lowy, MD   25 mg at 10/17/15 0842  . metoprolol (LOPRESSOR) tablet 25 mg  25 mg Oral QODAY Myer Peer Cobos, MD   25 mg at 10/17/15 1004  . pantoprazole (PROTONIX) EC tablet 40 mg  40 mg Oral BID Jenne Campus, MD   40 mg at 10/17/15 0844  . QUEtiapine (SEROQUEL) tablet 25 mg  25 mg Oral BID Scotti Motter, MD      . rosuvastatin (CRESTOR) tablet 10 mg  10 mg Oral Daily Jenne Campus, MD   10 mg at 10/17/15 0845  . traZODone (DESYREL) tablet 50 mg  50 mg Oral QHS PRN Lurena Nida, NP   50 mg at 10/13/15 2129    Lab Results:  No results found for this or any previous visit (from the past 48 hour(s)).  Blood Alcohol level:  Lab Results  Component Value Date   ETH <5 10/12/2015    Physical Findings: AIMS: Facial and Oral Movements Muscles of Facial Expression: None, normal Lips and Perioral Area: None, normal Jaw: None, normal Tongue: None, normal,Extremity Movements Upper (arms, wrists, hands, fingers): None, normal Lower (legs, knees, ankles, toes): None, normal, Trunk Movements Neck, shoulders, hips: None, normal, Overall Severity Severity of abnormal movements (highest score from questions above): None, normal Incapacitation due to abnormal movements: None, normal Patient's awareness of abnormal movements (rate only patient's report): No Awareness, Dental Status Current problems with teeth and/or dentures?: No Does patient usually wear dentures?: No  CIWA:  CIWA-Ar Total: 1 COWS:  COWS Total Score: 2  Musculoskeletal: Strength & Muscle Tone: within normal limits Gait & Station: normal Patient leans: no lean  Psychiatric Specialty Exam: Physical Exam  Vitals reviewed. Constitutional: He appears well-developed.  HENT:  Head: Normocephalic.  Psychiatric: His mood appears anxious. Thought content is not paranoid. He exhibits a  depressed mood. He expresses no homicidal and no suicidal ideation.    Review of Systems  Cardiovascular: Negative for chest pain.  Neurological: Negative for tremors.  Psychiatric/Behavioral: Negative for suicidal ideas and substance abuse.    Blood pressure 139/89, pulse 79, temperature 97.4 F (36.3 C), temperature source Oral, resp. rate 18, height 5\' 11"  (1.803 m), weight 189 lb (85.73 kg), SpO2 99 %.Body mass index is 26.37 kg/(m^2).  General Appearance: Casual  Eye Contact:  Fair  Speech:  Normal Rate  Volume:  Decreased  Mood:  Dysphoric and irritable   Affect:  Congruent  Thought Process:  Goal Directed  Orientation:  Full (Time, Place, and Person)  Thought Content:  Logical  Suicidal Thoughts:  No  Homicidal Thoughts:  No  Memory:  Immediate;   Fair Recent;   Fair  Judgement:  Fair  Insight:  Shallow  Psychomotor Activity:  Normal  Concentration:  Concentration: Fair and Attention Span: Fair  Recall:  [employer] of Knowledge:  Fair  Language:  Fair  Akathisia:  Negative  Handed:  Right  AIMS (if indicated):     Assets:  Desire for Improvement  ADL's:  Intact  Cognition:  WNL  Sleep:  Number of Hours: 6.5   Treatment Plan Summary: Daily contact with patient to assess and evaluate symptoms and progress in treatment, Medication management and Plan as follows  Major depression: have been on SSRI and other meds. Non compliant .  Will increase Lamictal to 25 mg twice daily Remains reluctant to be on other meds. We will start Seroquel at 25 mg twice daily to address irritability Mood irritable, obtain collateral from wife Not suicidal Denies drug use Continue to attend groups  Monitor vitals and progress  Jozsef Wescoat, MD 10/17/2015, 10:13 AM

## 2015-10-17 NOTE — B[facility] Group Notes (Signed)
[place] LCSW Group Therapy 10/17/2015 1:15 PM  Type of Therapy: Group Therapy- Emotion Regulation  Pt did not attend, declined invitation.   Peri Maris, [place] 10/17/2015 4:07 PM

## 2015-10-17 NOTE — B[facility] Group Notes (Signed)
[facility] LCSW Aftercare Discharge Planning Group Note  10/17/2015 8:45 AM  Participation Quality: Alert, Appropriate and Oriented  Mood/Affect: Irritable   Depression Rating: 0  Anxiety Rating: 0  Thoughts of Suicide: Pt denies SI/HI  Will you contract for safety? Yes  Current AVH: Pt denies  Plan for Discharge/Comments: Pt attended discharge planning group and actively participated in group. CSW discussed suicide prevention education with the group and encouraged them to discuss discharge planning and any relevant barriers. Pt is observed to be irritable this morning and is expressing a desire to go home.  Transportation Means: Pt reports access to transportation  Supports: No supports mentioned at this time  Arthur Moore, [place] 10/17/2015 10:18 AM

## 2015-10-17 NOTE — Progress Notes (Signed)
D    Pt was up interacting minimally on the unit     He was complaining of sleep apnea and said he just wants to go home   He said he cannot sleep here    He makes eye contact better  A   Verbal support given    Medications administered and effectiveness monitored   Q 15 min checks R    Pt safe at present time

## 2015-10-17 NOTE — Progress Notes (Signed)
D    Pt was up interacting minimally on the unit     He was complaining of sleep apnea and said he just wants to go home   He said he cannot sleep here    He asked this staff person if he was acting irritable and answer received was that he was on edge   Pt said He doesn't get sleep and he wishes the doctor could understand that and give him something strong to sleep A   Verbal support given    Medications administered and effectiveness monitored   Q 15 min checks R    Pt safe at present time

## 2015-10-17 NOTE — Progress Notes (Signed)
Recreation Therapy Notes  Date: 06.21.2017 Time: 9:30am Location: [address]   Group Topic: Stress Management  Goal Area(s) Addresses:  Patient will actively participate in stress management techniques presented during session.   Behavioral Response: Did not attend.   Laureen Ochs Joniqua Sidle, LRT/CTRS         Doren Kaspar L 10/17/2015 1:23 PM

## 2015-10-18 NOTE — B[facility] Group Notes (Signed)
[facility] Mental Health Association Group Therapy 10/18/2015 1:15pm  Type of Therapy: Mental Health Association Presentation  Participation Level: Active  Participation Quality: Attentive  Affect: Appropriate  Cognitive: Oriented  Insight: Developing/Improving  Engagement in Therapy: Engaged  Modes of Intervention: Discussion, Education and Socialization  Summary of Progress/Problems: Mental Health Association ([place]) Speaker came to talk about his personal journey with substance abuse and addiction. The pt processed ways by which to relate to the speaker. [place] speaker provided handouts and educational information pertaining to groups and services offered by the [facility]. Pt was engaged in speaker's presentation and was receptive to resources provided.    Peri Maris, LCSWA 10/18/2015 1:48 PM

## 2015-10-18 NOTE — Progress Notes (Signed)
Patient did not attend wrap up group. 

## 2015-10-18 NOTE — Progress Notes (Signed)
Patient ID: Arthur Moore, male   DOB: 02-10-1951, 65 y.o.   MRN: [id number] Adult Psychoeducational Group Note  Date:  10/18/2015 Time: 09:45am   Group Topic/Focus:  Self Care:   The focus of this group is to help patients understand the importance of self-care in order to improve or restore emotional, physical, spiritual, interpersonal, and financial health.  Participation Level:  Active  Participation Quality:  Appropriate  Affect:  Appropriate  Cognitive:  Appropriate  Insight: Improving  Engagement in Group:  Engaged  Modes of Intervention:  Activity, Discussion, Education and Support  Additional Comments:  Pt attended group, verbalized that he plans to use more positive self talk and have a "more positive attitude" post discharge.  Elenore Rota 10/18/2015, 11:15 AM

## 2015-10-18 NOTE — Progress Notes (Signed)
Patient ID: Arthur Moore, male   DOB: 1951/04/12, 65 y.o.   MRN: [id number]   Pt currently presents with a flat affect and irritable behavior. Per self inventory, pt rates depression at a 1, hopelessness 1 and anxiety 0. Pt's daily goal is "getting ready to go home" and they intend to do so by "get enough sleep (quality)." Pt reports good sleep, a good appetite, normal energy and good concentration. Pt mood is labile, participates in groups. No interaction with peers.   Pt provided with medications per providers orders. Pt's labs and vitals were monitored throughout the day. Pt supported emotionally and encouraged to express concerns and questions. Pt educated on medications.  Pt's safety ensured with 15 minute and environmental checks. Pt currently denies SI/HI and A/V hallucinations. Pt verbally agrees to seek staff if SI/HI or A/VH occurs and to consult with staff before acting on any harmful thoughts. Pt writes "When do I get to go home." Will continue POC.

## 2015-10-18 NOTE — Progress Notes (Signed)
Patient did not attend karaoke group tonight. 

## 2015-10-18 NOTE — Progress Notes (Signed)
[facility] MD Progress Note  10/19/2015 2:03 PM Arthur Moore  MRN:  [id number]  Subjective:  "I'm not really getting any treatment.  I want to go to the [place] and get my treatment there." Objective:  Patient  seen today and chart reviewed. Patient was also discussed in treatment team. Patient presents very irritable. Rest of team agrees that patient is resistant ot care and reluctant to take any medications. Patient was told that since he continues to be irritable and has not made any improvement in his mood we will not be able to discharge him today. We also discussed with him that we will confer with his wife prior to discharge.  He denies any suicidal thoughts. He has been cooperative on the unit. No manic symptoms. No disruptive behaviour today.   Patient is adamant that he wants to get care at the [place].  CSW spoke with wife and she is ready for patient to come back home.    Principal Problem: MDD (major depressive disorder), recurrent severe, without psychosis ([place]) Diagnosis:   Patient Active Problem List   Diagnosis Date Noted  . MDD (major depressive disorder), recurrent severe, without psychosis ([place]) [F33.2] 10/13/2015    Priority: High  . Hyperlipidemia [E78.5] 08/24/2015  . Arthritis [M19.90]   . Depression [F32.9]   . Hypertension [I10]   . Aortic insufficiency [I35.1]   . Psoriasis [L40.9]   . Cervical disc disease [M50.90]   . Lumbar disc disease [M51.9]   . Asthma [J45.909] 01/06/2015  . Psoriatic arthritis ([place]) [L40.50] 01/06/2015  . 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 GERD (gastroesophageal reflux disease) [K21.9] 01/06/2015  . 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 Arthritis   . Depression   . Hypertension   . Aortic insufficiency   . Psoriasis   . Cervical disc disease   . Asthma   .  Lumbar disc disease   . Hyperlipidemia 08/24/2015    Past Surgical History  Procedure Laterality Date  . Hernia repair    . Cholecystectomy  2012  . Sinus exploration  2013  . Parotidectomy Right 1992  . Prostate surgery  2002, 2004  . Spine surgery  2013    L C7-T1 laminectomy and discectomy   Family History:  Family History  Problem Relation Age of Onset  . Colon cancer Mother   . Lung cancer Father     Social History:  History  Alcohol Use No     History  Drug Use No    Social History   Social History  . Marital Status: Married    Spouse Name: N/A  . Number of Children: N/A  . Years of Education: N/A   Social History Main Topics  . Smoking status: Never Smoker   . Smokeless tobacco: None  . Alcohol Use: No  . Drug Use: No  . Sexual Activity: Not Asked   Other Topics Concern  . None   Social History Narrative   Additional Social History:    History of alcohol / drug use?: No history of alcohol / drug abuse  Sleep: Fair  Appetite:  Fair  Current Medications: Current Facility-Administered Medications  Medication Dose Route Frequency Provider Last Rate Last Dose  . acetaminophen (TYLENOL) tablet 650 mg  650 mg Oral Q6H PRN Lurena Nida, NP   650 mg at 10/18/15 1454  .  albuterol (PROVENTIL HFA;VENTOLIN HFA) 108 (90 Base) MCG/ACT inhaler 2 puff  2 puff Inhalation QID Jenne Campus, MD   2 puff at 10/16/15 2024  . budesonide-formoterol (SYMBICORT) 160-4.5 MCG/ACT inhaler 2 puff  2 puff Inhalation BID Jenne Campus, MD   2 puff at 10/19/15 1035  . cetirizine (ZYRTEC) tablet 10 mg  10 mg Oral Daily Jenne Campus, MD   10 mg at 10/19/15 1036  . fluticasone (FLONASE) 50 MCG/ACT nasal spray 2 spray  2 spray Each Nare Daily Jenne Campus, MD   2 spray at 10/15/15 1245  . hydrOXYzine (ATARAX/VISTARIL) tablet 50 mg  50 mg Oral Q6H PRN Nanci Pina, FNP   50 mg at 10/18/15 1808  . ibuprofen (ADVIL,MOTRIN) tablet 800 mg  800 mg Oral Q8H PRN Niel Hummer, NP   800 mg at 10/18/15 1214  . lamoTRIgine (LAMICTAL) tablet 25 mg  25 mg Oral BID Himabindu Ravi, MD   25 mg at 10/19/15 1033  . metoprolol (LOPRESSOR) tablet 25 mg  25 mg Oral QODAY Myer Peer Cobos, MD   25 mg at 10/17/15 1004  . pantoprazole (PROTONIX) EC tablet 40 mg  40 mg Oral BID Jenne Campus, MD   40 mg at 10/19/15 1034  . QUEtiapine (SEROQUEL) tablet 25 mg  25 mg Oral BID Himabindu Ravi, MD   25 mg at 10/18/15 0814  . rosuvastatin (CRESTOR) tablet 10 mg  10 mg Oral Daily Jenne Campus, MD   10 mg at 10/19/15 1036  . traZODone (DESYREL) tablet 50 mg  50 mg Oral QHS PRN Lurena Nida, NP   50 mg at 10/13/15 2129    Lab Results:  No results found for this or any previous visit (from the past 48 hour(s)).  Blood Alcohol level:  Lab Results  Component Value Date   ETH <5 10/12/2015    Physical Findings: AIMS: Facial and Oral Movements Muscles of Facial Expression: None, normal Lips and Perioral Area: None, normal Jaw: None, normal Tongue: None, normal,Extremity Movements Upper (arms, wrists, hands, fingers): None, normal Lower (legs, knees, ankles, toes): None, normal, Trunk Movements Neck, shoulders, hips: None, normal, Overall Severity Severity of abnormal movements (highest score from questions above): None, normal Incapacitation due to abnormal movements: None, normal Patient's awareness of abnormal movements (rate only patient's report): No Awareness, Dental Status Current problems with teeth and/or dentures?: No Does patient usually wear dentures?: No  CIWA:  CIWA-Ar Total: 1 COWS:  COWS Total Score: 2  Musculoskeletal: Strength & Muscle Tone: within normal limits Gait & Station: normal Patient leans: no lean  Psychiatric Specialty Exam: Physical Exam  Nursing note and vitals reviewed. Constitutional: He appears well-developed.  HENT:  Head: Normocephalic.  Psychiatric: His mood appears anxious. His affect is not blunt. Thought content is not  paranoid. He does not exhibit a depressed mood. He expresses no homicidal and no suicidal ideation.    Review of Systems  Cardiovascular: Negative for chest pain.  Neurological: Negative for tremors.  Psychiatric/Behavioral: Negative for suicidal ideas and substance abuse. The patient is nervous/anxious.   All other systems reviewed and are negative.   Blood pressure 146/91, pulse 85, temperature 98 F (36.7 C), temperature source Oral, resp. rate 16, height 5\' 11"  (1.803 m), weight 85.73 kg (189 lb), SpO2 99 %.Body mass index is 26.37 kg/(m^2).  General Appearance: Casual  Eye Contact:  Fair  Speech:  Normal Rate  Volume:  Decreased  Mood:  Dysphoric  and irritable   Affect:  Congruent  Thought Process:  Goal Directed  Orientation:  Full (Time, Place, and Person)  Thought Content:  Logical  Suicidal Thoughts:  No  Homicidal Thoughts:  No  Memory:  Immediate;   Fair Recent;   Fair  Judgement:  Fair  Insight:  Shallow  Psychomotor Activity:  Normal  Concentration:  Concentration: Fair and Attention Span: Fair  Recall:  [employer] of Knowledge:  Fair  Language:  Fair  Akathisia:  Negative  Handed:  Right  AIMS (if indicated):     Assets:  Desire for Improvement  ADL's:  Intact  Cognition:  WNL  Sleep:  Number of Hours: 6.75   Treatment Plan Summary: Daily contact with patient to assess and evaluate symptoms and progress in treatment, Medication management and Plan as follows  Major depression: have been on SSRI and other meds. Non compliant .  Cont Lamictal to 25 mg twice daily but patient refusing. Remains reluctant to be on other meds. Cont Seroquel at 25 mg twice daily to address irritability but patient is refusing. Mood irritable, obtain collateral from wife Not suicidal Denies drug use Continue to attend groups  Monitor vitals and progress  Janett Labella, NP [facility] 10/19/2015, 2:03 PM

## 2015-10-19 MED ORDER — METOPROLOL TARTRATE 25 MG PO TABS: 25.0000 mg | ORAL_TABLET | ORAL | Status: DC

## 2015-10-19 MED ORDER — HYDROXYZINE HCL 50 MG PO TABS: 50.0000 mg | ORAL_TABLET | Freq: Four times a day (QID) | ORAL | Status: DC | PRN

## 2015-10-19 MED ORDER — LAMOTRIGINE 25 MG PO TABS: 25.0000 mg | ORAL_TABLET | Freq: Two times a day (BID) | ORAL | Status: DC

## 2015-10-19 MED ORDER — QUETIAPINE FUMARATE 25 MG PO TABS: 25.0000 mg | ORAL_TABLET | Freq: Two times a day (BID) | ORAL | Status: DC

## 2015-10-19 MED ORDER — TRAZODONE HCL 50 MG PO TABS: 50.0000 mg | ORAL_TABLET | Freq: Every evening | ORAL | Status: DC | PRN

## 2015-10-19 NOTE — Tx Team (Signed)
Interdisciplinary Treatment Plan Update (Adult) Date: 10/19/2015   Date: 10/19/2015 11:58 AM  Progress in Treatment:  Attending groups: Intermittently Participating in groups: No  Taking medication as prescribed: Yes  Tolerating medication: Yes  Family/Significant othe contact made:Yes with wife Patient understands diagnosis: Minimal insight Discussing patient identified problems/goals with staff: Yes  Medical problems stabilized or resolved: Yes  Denies suicidal/homicidal ideation: Yes Patient has not harmed self or Others: Yes   New problem(s) identified: None identified at this time.   Discharge Plan or Barriers: Pt will return home and [facility]  Additional comments:  Patient and CSW reviewed pt's identified goals and treatment plan. Patient verbalized understanding and agreed to treatment plan.   Reason for Continuation of Hospitalization:  Depression Medication stabilization  Estimated length of stay: 0 days  Review of initial/current patient goals per problem list:   1.  Goal(s): Patient will participate in aftercare plan  Met:  Yes  Target date: 3-5 days from date of admission   As evidenced by: Patient will participate within aftercare plan AEB aftercare provider and housing plan at discharge being identified.   10/15/15:  Pt will return home and [facility] with [facility]  2.  Goal (s): Patient will exhibit decreased depressive symptoms and suicidal ideations.  Met:  Yes  Target date: 3-5 days from date of admission   As evidenced by: Patient will utilize self rating of depression at 3 or below and demonstrate decreased signs of depression or be deemed stable for discharge by MD.  10/15/15: Pt rates depression at 3/10; denies SI  Attendees:  Patient:    Family:    Physician: Dr. Einar Grad, MD  10/19/2015 11:58 AM  Nursing: Marcella Dubs , RN 10/19/2015 11:58 AM  Clinical Social Worker Peri Maris, [place] 10/19/2015 11:58 AM  Other: Erasmo Downer  [place], [place] 10/19/2015 11:58 AM  Clinical:  10/19/2015 11:58 AM  Other:  10/19/2015 11:58 AM  Other:     Peri Maris, [place] Work [phone]

## 2015-10-19 NOTE — Progress Notes (Addendum)
Patient ID: Arthur Moore, male   DOB: [birth year], 65 y.o.   MRN: [id number]  Pt discharged to lobby. Pt was stable and appreciative at that time. All papers and prescriptions were given and valuables returned. Verbal understanding expressed. Denies SI/HI and A/VH. Pt given opportunity to express concerns and ask questions.

## 2015-10-19 NOTE — Progress Notes (Signed)
   D: Pt appeared to be irritated when talking to the writer.  "Did my wife call? She wants to know if I'm leaving tomorrow". Writer attempted to explain that she hadn't spoken to his wife and pt became impatient. Pt cut the writer off and turned his head. Pt has no questions or concerns.   A:  Support and encouragement was offered. 15 min checks continued for safety.  R: Pt remains safe.

## 2015-10-19 NOTE — Discharge Summary (Signed)
Physician Discharge Summary Note  Patient:  Arthur Moore is an 65 y.o., male MRN:  [id number] DOB:  10-14-1950 Patient phone:  [phone] (home)  Patient address:   [place] [address],  Total Time spent with patient: 45 minutes  Date of Admission:  10/13/2015 Date of Discharge: 10/19/2015  Reason for Admission:  depression  Principal Problem: MDD (major depressive disorder), recurrent severe, without psychosis [facility]) Discharge Diagnoses: Patient Active Problem List   Diagnosis Date Noted  . MDD (major depressive disorder), recurrent severe, without psychosis ([name]) [F33.2] 10/13/2015    Priority: High  . Hyperlipidemia [E78.5] 08/24/2015  . Arthritis [M19.90]   . Depression [F32.9]   . Hypertension [I10]   . Aortic insufficiency [I35.1]   . Psoriasis [L40.9]   . Cervical disc disease [M50.90]   . Lumbar disc disease [M51.9]   . Asthma [J45.909] 01/06/2015  . Psoriatic arthritis ([place]) [L40.50] 01/06/2015  . 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 GERD (gastroesophageal reflux disease) [K21.9] 01/06/2015  . Laryngopharyngeal reflux (LPR) [J38.7] 01/06/2015    Past Psychiatric History: see HPi  Past Medical History:  Past Medical History  Diagnosis Date  . Arthritis   . Depression   . Hypertension   . Aortic insufficiency   . Psoriasis   . Cervical disc disease   . Asthma   . Lumbar disc disease   . Hyperlipidemia 08/24/2015    Past Surgical History  Procedure Laterality Date  . Hernia repair    . Cholecystectomy  2012  . Sinus exploration  2013  . Parotidectomy Right 1992  . Prostate surgery  2002, 2004  . Spine surgery  2013    L C7-T1 laminectomy and discectomy   Family History:  Family History  Problem Relation Age of Onset  . Colon cancer Mother   . Lung cancer Father    Family Psychiatric  History: see HPI Social History:  History  Alcohol Use No     History  Drug Use No    Social History    Social History  . Marital Status: Married    Spouse Name: N/A  . Number of Children: N/A  . Years of Education: N/A   Social History Main Topics  . Smoking status: Never Smoker   . Smokeless tobacco: None  . Alcohol Use: No  . Drug Use: No  . Sexual Activity: Not Asked   Other Topics Concern  . None   Social History Narrative    Hospital Course:   Arthur Moore was admitted for MDD (major depressive disorder), recurrent severe, without psychosis ([name]) and crisis management.  She was treated with medications with their indications listed below.  Arthur was reluctant to take any medications as Arthur states that nothing worked for him.  Medical problems were identified and treated as needed.  Home medications were restarted as appropriate.  Improvement was monitored by observation and [name] daily report of symptom reduction.  Emotional and mental status was monitored by daily self inventory reports completed by [name] and clinical staff.  Patient reported continued improvement, denied any new concerns.  Patient had been compliant on medications and denied side effects.  Support and encouragement was provided.    At time of discharge, patient rated both depression and anxiety levels to be manageable and minimal.  Patient encouraged to attend groups to help with recognizing triggers of emotional crises and de-stabilizations.  Patient encouraged to attend group to help identify the positive things in life that would  help in dealing with feelings of loss, depression and unhealthy or abusive tendencies.         [name] was evaluated by the treatment team for stability and plans for continued recovery upon discharge.  She was offered further treatment options upon discharge including Residential, Intensive Outpatient and Outpatient treatment. She will follow up with agencies listed below for medication management and counseling.  Encouraged patient to maintain  satisfactory support network and home environment.  Advised to adhere to medication compliance and outpatient treatment follow up.  Prescriptions provided.       [name] was an integral factor for scheduling further treatment.  Employment, transportation, bed availability, health status, family support, and any pending legal issues were also considered during her hospital stay.  Upon completion of this admission the patient was both mentally and medically stable for discharge denying suicidal/homicidal ideation, auditory/visual/tactile hallucinations, delusional thoughts and paranoia.      Physical Findings: AIMS: Facial and Oral Movements Muscles of Facial Expression: None, normal Lips and Perioral Area: None, normal Jaw: None, normal Tongue: None, normal,Extremity Movements Upper (arms, wrists, hands, fingers): None, normal Lower (legs, knees, ankles, toes): None, normal, Trunk Movements Neck, shoulders, hips: None, normal, Overall Severity Severity of abnormal movements (highest score from questions above): None, normal Incapacitation due to abnormal movements: None, normal Patient's awareness of abnormal movements (rate only patient's report): No Awareness, Dental Status Current problems with teeth and/or dentures?: No Does patient usually wear dentures?: No  CIWA:  CIWA-Ar Total: 1 COWS:  COWS Total Score: 2  Musculoskeletal: Strength & Muscle Tone: within normal limits Gait & Station: normal Patient leans: N/A  Psychiatric Specialty Exam:  See MD SRA Physical Exam  ROS  Blood pressure 146/91, pulse 85, temperature 98 F (36.7 C), temperature source Oral, resp. rate 16, height 5\' 11"  (1.803 m), weight 85.73 kg (189 lb), SpO2 99 %.Body mass index is 26.37 kg/(m^2).    Have you used any form of tobacco in the last 30 days? (Cigarettes, Smokeless Tobacco, Cigars, and/or Pipes): No  Has this patient used any form of tobacco in the last 30 days? (Cigarettes,  Smokeless Tobacco, Cigars, and/or Pipes)  N/A  Blood Alcohol level:  Lab Results  Component Value Date   ETH <5 [id number]    Metabolic Disorder Labs:  No results found for: HGBA1C, MPG No results found for: PROLACTIN No results found for: CHOL, TRIG, HDL, CHOLHDL, VLDL, LDLCALC  See Psychiatric Specialty Exam and Suicide Risk Assessment completed by Attending Physician prior to discharge.  Discharge destination:  Home  Is patient on multiple antipsychotic therapies at discharge:  No   Has Patient had three or more failed trials of antipsychotic monotherapy by history:  No  Recommended Plan for Multiple Antipsychotic Therapies: NA     Medication List    STOP taking these medications        budesonide-formoterol 160-4.5 MCG/ACT inhaler  Commonly known as:  SYMBICORT     cetirizine 10 MG tablet  Commonly known as:  ZYRTEC     EPIPEN 2-PAK 0.3 mg/0.3 mL Soaj injection  Generic drug:  EPINEPHrine     metoprolol succinate 25 MG 24 hr tablet  Commonly known as:  TOPROL-XL     pantoprazole 40 MG tablet  Commonly known as:  PROTONIX     rosuvastatin 10 MG tablet  Commonly known as:  CRESTOR      TAKE these medications      Indication   hydrOXYzine 50 MG  tablet  Commonly known as:  ATARAX/VISTARIL  Take 1 tablet (50 mg total) by mouth every 6 (six) hours as needed for anxiety.   Indication:  Anxiety Neurosis     lamoTRIgine 25 MG tablet  Commonly known as:  LAMICTAL  Take 1 tablet (25 mg total) by mouth 2 (two) times daily.   Indication:  mood stabilization     metoprolol tartrate 25 MG tablet  Commonly known as:  LOPRESSOR  Take 1 tablet (25 mg total) by mouth every other day.   Indication:  High Blood Pressure     QUEtiapine 25 MG tablet  Commonly known as:  SEROQUEL  Take 1 tablet (25 mg total) by mouth 2 (two) times daily.   Indication:  mood stabilization     traZODone 50 MG tablet  Commonly known as:  DESYREL  Take 1 tablet (50 mg total) by mouth  at bedtime as needed for sleep.   Indication:  Trouble Sleeping           Follow-up Information    Follow up with Pt prefers to schedule his own follow-up appointments with outpatient providers.     Follow-up recommendations:  Activity:  as tol Diet:  as tol  Comments:  1.  Take all your medications as prescribed.   2.  Report any adverse side effects to outpatient provider. 3.  Patient instructed to not use alcohol or illegal drugs while on prescription medicines. 4.  In the event of worsening symptoms, instructed patient to call 911, the crisis hotline or go to nearest emergency room for evaluation of symptoms.  Signed: Janett Labella, NP [facility] 10/19/2015, 2:05 PM

## 2015-10-19 NOTE — Progress Notes (Signed)
Patient ID: Arthur Moore, male   DOB: 02/12/1951, 65 y.o.   MRN: [id number]   Pt currently presents with a flat affect and irritable behavior. Pt reports being tired this morning, refuses to get medications. Comes to get medications a while later and refuses to take BP medication, pt states "I take 50 mg at home, I'm not going to take half. What's the point."   Pt provided with medications per providers orders. Pt's labs and vitals were monitored throughout the day. Pt supported emotionally and encouraged to express concerns and questions. Pt educated on medications and hypertension.   Pt's safety ensured with 15 minute and environmental checks. Pt currently denies SI/HI and A/V hallucinations. Pt verbally agrees to seek staff if SI/HI or A/VH occurs and to consult with staff before acting on any harmful thoughts. Pt takes Lamictal, refusing Seroquel. Pt to be discharged today. Will continue POC.

## 2015-10-19 NOTE — Progress Notes (Signed)
  [facility] Adult Case Management Discharge Plan :  Will you be returning to the same living situation after discharge:  Yes,  Pt returning home with wife At discharge, do you have transportation home?: Yes,  Pt wife to pick up Do you have the ability to pay for your medications: Yes,  Pt provided with prescriptions  Release of information consent forms completed and in the chart;  Patient's signature needed at discharge.  Patient to Follow up at: Follow-up Information    Follow up with Pt prefers to schedule his own follow-up appointments with outpatient providers.      Next level of care provider has access to [place] and Suicide Prevention discussed: Yes,  with wife; see SPE note  Have you used any form of tobacco in the last 30 days? (Cigarettes, Smokeless Tobacco, Cigars, and/or Pipes): No  Has patient been referred to the Quitline?: N/A patient is not a smoker  Patient has been referred for addiction treatment: N/A  Bo Mcclintock 10/19/2015, 12:01 PM

## 2015-10-19 NOTE — [facility] Suicide Risk Assessment (Signed)
[facility] Discharge Suicide Risk Assessment   Principal Problem: MDD (major depressive disorder), recurrent severe, without psychosis ([place]) Discharge Diagnoses:  Patient Active Problem List   Diagnosis Date Noted  . MDD (major depressive disorder), recurrent severe, without psychosis ([place]) [F33.2] 10/13/2015  . Hyperlipidemia [E78.5] 08/24/2015  . Arthritis [M19.90]   . Depression [F32.9]   . Hypertension [I10]   . Aortic insufficiency [I35.1]   . Psoriasis [L40.9]   . Cervical disc disease [M50.90]   . Lumbar disc disease [M51.9]   . Asthma [J45.909] 01/06/2015  . Psoriatic arthritis ([place]) [L40.50] 01/06/2015  . 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 GERD (gastroesophageal reflux disease) [K21.9] 01/06/2015  . 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  All other systems reviewed and are negative.   Blood pressure 146/91, pulse 85, temperature 98 F (36.7 C), temperature source Oral, resp. rate 16, height 5\' 11"  (1.803 m), weight 85.73 kg (189 lb), SpO2 99 %.Body mass index is 26.37 kg/(m^2).  General Appearance: Casual  Eye Contact::  Fair  Speech:  Clear and Coherent409  Volume:  Normal  Mood:  Euthymic  Affect:  Congruent  Thought Process:  Goal Directed and Descriptions of Associations: Intact  Orientation:  Full (Time, Place, and Person)  Thought Content:  Logical  Suicidal Thoughts:  No  Homicidal Thoughts:  No  Memory:  Immediate;   Fair Recent;   Fair Remote;   Fair  Judgement:  Fair  Insight:  Fair  Psychomotor Activity:  Normal  Concentration:  Fair  Recall:  [employer] of Knowledge:Fair  Language: Fair  Akathisia:  No  Handed:  Right  AIMS (if indicated):      Assets:  Desire for Improvement  Sleep:  Number of Hours: 6.75  Cognition: WNL  ADL's:  Intact   Mental Status Per Nursing Assessment::   On Admission:  NA  Demographic Factors:  Male and Caucasian  Loss Factors: NA  Historical Factors: Impulsivity  Risk Reduction Factors:   Positive social support  Continued Clinical Symptoms:  Previous Psychiatric Diagnoses and Treatments  Cognitive Features That Contribute To Risk:  None    Suicide Risk:  Minimal: No identifiable suicidal ideation.  Patients presenting with no risk factors but with morbid ruminations; may be classified as minimal risk based on the severity of the depressive symptoms  Follow-up Information    Follow up with Pt prefers to schedule his own follow-up appointments with outpatient providers.      Plan Of Care/Follow-up recommendations:  Activity:  no restrictions Diet:  regular Tests:  as needed Other:  follow up with aftercare  Niamh Rada, MD 10/19/2015, 9:55 AM

## 2015-10-26 ENCOUNTER — Ambulatory Visit (INDEPENDENT_AMBULATORY_CARE_PROVIDER_SITE_OTHER): Payer: 59 | Admitting: *Deleted

## 2015-10-26 DIAGNOSIS — J309 Allergic rhinitis, unspecified: Secondary | ICD-10-CM | POA: Diagnosis not present

## 2015-10-31 ENCOUNTER — Other Ambulatory Visit: Payer: Self-pay | Admitting: Pain Medicine

## 2015-10-31 DIAGNOSIS — M542 Cervicalgia: Secondary | ICD-10-CM

## 2015-10-31 DIAGNOSIS — M25519 Pain in unspecified shoulder: Secondary | ICD-10-CM

## 2015-11-01 ENCOUNTER — Other Ambulatory Visit: Payer: Self-pay

## 2015-11-05 ENCOUNTER — Telehealth: Payer: Self-pay | Admitting: Cardiovascular Disease

## 2015-11-05 NOTE — Telephone Encounter (Signed)
Returned call to patient. He said he's been having a "dull aching feeling that comes and goes." The area is located on his right side below chest even with the bottom of his rib cage. He says its completely relieved when he stands up. He feels it more when hes lying or sitting. He will change positions then it is relieved. Its been going on for the past 3-4 days. He says he has chronic neck and back pain and is in the process of seeing a pain management doctor for that.  Denies SOB, palpitations, sweating, N/V, or dizziness or pain down his left arm. Patient said at his last appt there was a Water quality scientist and he was unable to come back to finish his appt.  Appt rescheduled for 11/22/15 to see Dr Oval Linsey.  Will route to Dr Oval Linsey for further advice.

## 2015-11-05 NOTE — Telephone Encounter (Signed)
New Message  Pt c/o of Chest Pain: STAT if CP now or developed within 24 hours  1. Are you having CP right now? No   2. Are you experiencing any other symptoms (ex. SOB, nausea, vomiting, sweating)? No   3. How long have you been experiencing CP? 2-3 days   4. Is your CP continuous or coming and going? Coming and going   5. Have you taken Nitroglycerin? No  ? Pt wanted to update RN and Dr. Oval Linsey

## 2015-11-06 ENCOUNTER — Ambulatory Visit
Admission: RE | Admit: 2015-11-06 | Discharge: 2015-11-06 | Disposition: A | Discharge status: Home / Self Care | Payer: 59 | Source: Ambulatory Visit | Attending: Pain Medicine | Admitting: Pain Medicine

## 2015-11-06 ENCOUNTER — Ambulatory Visit (INDEPENDENT_AMBULATORY_CARE_PROVIDER_SITE_OTHER): Payer: 59

## 2015-11-06 DIAGNOSIS — M542 Cervicalgia: Secondary | ICD-10-CM

## 2015-11-06 DIAGNOSIS — J309 Allergic rhinitis, unspecified: Secondary | ICD-10-CM | POA: Diagnosis not present

## 2015-11-06 DIAGNOSIS — M25519 Pain in unspecified shoulder: Secondary | ICD-10-CM

## 2015-11-06 NOTE — Telephone Encounter (Signed)
Advised patient

## 2015-11-06 NOTE — Telephone Encounter (Signed)
Agree he should be evaluated.  If the pain does not go away or occurs with exertion he needs to seek care more urgently.

## 2015-11-16 ENCOUNTER — Encounter: Payer: Self-pay | Admitting: *Deleted

## 2015-11-16 NOTE — Progress Notes (Signed)
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

## 2015-11-20 ENCOUNTER — Ambulatory Visit (INDEPENDENT_AMBULATORY_CARE_PROVIDER_SITE_OTHER): Payer: 59 | Admitting: *Deleted

## 2015-11-20 DIAGNOSIS — J309 Allergic rhinitis, unspecified: Secondary | ICD-10-CM | POA: Diagnosis not present

## 2015-11-21 DIAGNOSIS — J3089 Other allergic rhinitis: Secondary | ICD-10-CM | POA: Diagnosis not present

## 2015-11-22 ENCOUNTER — Encounter: Payer: Self-pay | Admitting: Cardiovascular Disease

## 2015-11-22 ENCOUNTER — Ambulatory Visit (INDEPENDENT_AMBULATORY_CARE_PROVIDER_SITE_OTHER): Payer: 59 | Admitting: Cardiovascular Disease

## 2015-11-22 VITALS — BP 116/80 | HR 78 | Ht 71.5 in | Wt 183.0 lb

## 2015-11-22 DIAGNOSIS — I351 Nonrheumatic aortic (valve) insufficiency: Secondary | ICD-10-CM | POA: Diagnosis not present

## 2015-11-22 DIAGNOSIS — I10 Essential (primary) hypertension: Secondary | ICD-10-CM

## 2015-11-22 DIAGNOSIS — J301 Allergic rhinitis due to pollen: Secondary | ICD-10-CM | POA: Diagnosis not present

## 2015-11-22 DIAGNOSIS — E785 Hyperlipidemia, unspecified: Secondary | ICD-10-CM | POA: Diagnosis not present

## 2015-11-22 NOTE — Progress Notes (Signed)
Cardiology Office Note   Date:  11/22/2015  ID:  Arthur Moore, DOB 1950-09-25, MRN [id number]  PCP:  Gerrit Heck, MD  Cardiologist:   Skeet Latch, MD   Chief Complaint  Patient presents with  . Follow-up      History of Present Illness: Arthur Moore is a 65 y.o. male with asthma, TIA, mild-moderate aortic insufficiency, hypertension and hyperlipidemia who presents for follow up.  Arthur Moore reports neck pain due to prior cervical spine injury.  He is awaiting an injection and if it fails he will need C1-C6 surgery.  He denies any recurrent chest pain or pressure.  He also denies shortness of breath.  He has been unable to exercise due to the pain.  He hasn't noted any lower extremity edema, orthopnea or PND.  He notes that his blood pressure has been better controlled lately.  He was taking both metoprolol and lisinopril and it got too low so he stopped lisinopril.  He previously had leg cramping on atorvastatin and was switched to rosuvastatin.  He had labs drawn with his PCP yesterday and is awaiting the results.  He is concerned that his triglycerides will be elevated because he has been drinking too many sodas lately.  Arthur Moore 06/2013 that revealed rare PACs and PVCs.  He previously followed up with a cardiologist in [place] due to mild AR.  He subsequently had an echo at the [place] in 2015 that reported it as mild-moderate.  He is very concerned about disease progression and the need to have his valve replaced.    Past Medical History:  Diagnosis Date  . Aortic insufficiency   . Arthritis   . Asthma   . Cervical disc disease   . Depression   . Hyperlipidemia 08/24/2015  . Hypertension   . Lumbar disc disease   . Psoriasis     Past Surgical History:  Procedure Laterality Date  . CHOLECYSTECTOMY  2012  . HERNIA REPAIR    . PAROTIDECTOMY Right 1992  . PROSTATE SURGERY  2002, 2004  . SINUS EXPLORATION  2013  . SPINE  SURGERY  2013   L C7-T1 laminectomy and discectomy     Current Outpatient Prescriptions  Medication Sig Dispense Refill  . albuterol (PROVENTIL HFA;VENTOLIN HFA) 108 (90 Base) MCG/ACT inhaler Inhale 1 puff into the lungs as needed for wheezing or shortness of breath.    . budesonide-formoterol (SYMBICORT) 160-4.5 MCG/ACT inhaler Inhale 2 puffs into the lungs 2 (two) times daily.    . cetirizine (ZYRTEC) 10 MG tablet Take 10 mg by mouth daily as needed for allergies.    . metoprolol tartrate (LOPRESSOR) 25 MG tablet Take 25 mg by mouth daily.    . pantoprazole (PROTONIX) 20 MG tablet Take 20 mg by mouth daily.    . rosuvastatin (CRESTOR) 10 MG tablet Take 10 mg by mouth daily.     No current facility-administered medications for this visit.     Allergies:   Atorvastatin    Social History:  The patient  reports that he has never smoked. He does not have any smokeless tobacco history on file. He reports that he does not drink alcohol or use drugs.   Family History:  The patient's family history includes Colon cancer in his mother; Lung cancer in his father.    ROS:  Please see the history of present illness.   Otherwise, review of systems are positive for back pain.   All other  systems are reviewed and negative.    PHYSICAL EXAM: VS:  BP 116/80   Pulse 78   Ht 5' 11.5" (1.816 m)   Wt 183 lb (83 kg)   BMI 25.17 kg/m  , BMI Body mass index is 25.17 kg/m.  GENERAL:  Well appearing.  No acute distress. HEENT:  Pupils equal round and reactive, fundi not visualized, oral mucosa unremarkable NECK:  No jugular venous distention, waveform within normal limits, carotid upstroke brisk and symmetric, no bruits LYMPHATICS:  No cervical adenopathy LUNGS:  Clear to auscultation bilaterally HEART:  RRR.  PMI not displaced or sustained,S1 and S2 within normal limits, no S3, no S4, no clicks, no rubs, no murmurs ABD:  Flat, positive bowel sounds normal in frequency in pitch, no bruits, no  rebound, no guarding, no midline pulsatile mass, no hepatomegaly, no splenomegaly EXT:  2 plus pulses throughout, no edema, no cyanosis no clubbing SKIN:  No rashes no nodules NEURO:  Cranial nerves II through XII grossly intact, motor grossly intact throughout PSYCH:  Cognitively intact, oriented to person place and time  EKG:  EKG is not ordered today.  Echo 04/25/14: LVEF 60%.  Aortic valve sclerosis without stenosis. Mild aortic insufficiency.  ETT 04/2013:  Exercise 8 minutes and 44 seconds on a Bruce protocol. Peak heart rate 136. 9.8 METS. No ischemic changes.  24 Hour Holter: Underlying rhythm is sinus. Average heart rate 74, minimum 52, max 123. Rare PACs and PVCs. No significant arrhythmia noted.  Recent Labs: 10/12/2015: ALT 59; BUN 17; Creatinine, Ser 1.14; Hemoglobin 16.3; Platelets 157; Potassium 3.6; Sodium 142    Lipid Panel No results found for: CHOL, TRIG, HDL, CHOLHDL, VLDL, LDLCALC, LDLDIRECT  11/2014: Total cholesterol 130, HDL 39, LDL 65, triglycerides 116  Wt Readings from Last 3 Encounters:  11/22/15 183 lb (83 kg)  08/24/15 192 lb (87.1 kg)  02/07/15 185 lb 11.2 oz (84.2 kg)      ASSESSMENT AND PLAN:  # Hyperlipidemia:  Arthur Moore is tolerating rosuvastatin.  Lipids were checked with his PCP.   # Aortic regurgitation: Outside echo was reviewed and he does have mild to moderate aortic insufficiency. He does not have any symptoms of heart failure at this time.  He is quite anxious about disease progression.  We will order an echo.  # Hypertension: BP well-controlled. Continue metoprolol.   # TIA: Continue aspirin and statin.    Current medicines are reviewed at length with the patient today.  The patient does not have concerns regarding medicines.  The following changes have been made: None  Labs/ tests ordered today include:   Orders Placed This Encounter  Procedures  . ECHOCARDIOGRAM COMPLETE    Disposition:   FU with Xoe Hoe C. Oval Linsey,  MD in 1 year.   Signed, Skeet Latch, MD  11/22/2015 4:10 PM    [facility]

## 2015-11-22 NOTE — Patient Instructions (Signed)
Medication Instructions:  Your physician recommends that you continue on your current medications as directed. Please refer to the Current Medication list given to you today.  Labwork: NONE  Testing/Procedures: Your physician has requested that you have an echocardiogram. Echocardiography is a painless test that uses sound waves to create images of your heart. It provides your doctor with information about the size and shape of your heart and how well your heart's chambers and valves are working. This procedure takes approximately one hour. There are no restrictions for this procedure.  Follow-Up: Your physician wants you to follow-up in: 1 YEAR  OV You will receive a reminder letter in the mail two months in advance. If you don't receive a letter, please call our office to schedule the follow-up appointment.  If you need a refill on your cardiac medications before your next appointment, please call your pharmacy.  

## 2015-11-23 DIAGNOSIS — J3081 Allergic rhinitis due to animal (cat) (dog) hair and dander: Secondary | ICD-10-CM | POA: Diagnosis not present

## 2015-12-05 ENCOUNTER — Other Ambulatory Visit: Payer: Self-pay

## 2015-12-05 ENCOUNTER — Ambulatory Visit (HOSPITAL_COMMUNITY): Payer: 59 | Attending: Cardiology

## 2015-12-05 ENCOUNTER — Ambulatory Visit (INDEPENDENT_AMBULATORY_CARE_PROVIDER_SITE_OTHER): Payer: 59 | Admitting: *Deleted

## 2015-12-05 ENCOUNTER — Encounter (INDEPENDENT_AMBULATORY_CARE_PROVIDER_SITE_OTHER): Payer: Self-pay

## 2015-12-05 DIAGNOSIS — J45909 Unspecified asthma, uncomplicated: Secondary | ICD-10-CM | POA: Diagnosis not present

## 2015-12-05 DIAGNOSIS — J309 Allergic rhinitis, unspecified: Secondary | ICD-10-CM | POA: Diagnosis not present

## 2015-12-05 DIAGNOSIS — I119 Hypertensive heart disease without heart failure: Secondary | ICD-10-CM | POA: Insufficient documentation

## 2015-12-05 DIAGNOSIS — I351 Nonrheumatic aortic (valve) insufficiency: Secondary | ICD-10-CM | POA: Diagnosis not present

## 2015-12-05 DIAGNOSIS — I371 Nonrheumatic pulmonary valve insufficiency: Secondary | ICD-10-CM | POA: Diagnosis not present

## 2015-12-05 DIAGNOSIS — E785 Hyperlipidemia, unspecified: Secondary | ICD-10-CM | POA: Insufficient documentation

## 2015-12-05 DIAGNOSIS — I340 Nonrheumatic mitral (valve) insufficiency: Secondary | ICD-10-CM | POA: Insufficient documentation

## 2015-12-10 ENCOUNTER — Ambulatory Visit: Payer: Self-pay | Admitting: Cardiovascular Disease

## 2015-12-19 ENCOUNTER — Ambulatory Visit (INDEPENDENT_AMBULATORY_CARE_PROVIDER_SITE_OTHER): Payer: 59 | Admitting: *Deleted

## 2015-12-19 DIAGNOSIS — J309 Allergic rhinitis, unspecified: Secondary | ICD-10-CM | POA: Diagnosis not present

## 2016-01-01 ENCOUNTER — Ambulatory Visit (INDEPENDENT_AMBULATORY_CARE_PROVIDER_SITE_OTHER): Payer: 59

## 2016-01-01 DIAGNOSIS — J309 Allergic rhinitis, unspecified: Secondary | ICD-10-CM

## 2016-01-04 ENCOUNTER — Other Ambulatory Visit: Payer: Self-pay | Admitting: *Deleted

## 2016-01-04 MED ORDER — ROSUVASTATIN CALCIUM 10 MG PO TABS: 10.0000 mg | ORAL_TABLET | Freq: Every day | ORAL | 3 refills | Status: DC

## 2016-01-04 NOTE — Telephone Encounter (Signed)
error 

## 2016-01-09 ENCOUNTER — Telehealth: Payer: Self-pay | Admitting: Neurosurgery

## 2016-01-09 ENCOUNTER — Other Ambulatory Visit: Payer: Self-pay | Admitting: Gastroenterology

## 2016-01-09 NOTE — Telephone Encounter (Signed)
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

## 2016-01-16 ENCOUNTER — Ambulatory Visit (INDEPENDENT_AMBULATORY_CARE_PROVIDER_SITE_OTHER): Payer: 59

## 2016-01-16 DIAGNOSIS — J309 Allergic rhinitis, unspecified: Secondary | ICD-10-CM | POA: Diagnosis not present

## 2016-01-21 ENCOUNTER — Ambulatory Visit (INDEPENDENT_AMBULATORY_CARE_PROVIDER_SITE_OTHER): Payer: 59 | Admitting: *Deleted

## 2016-01-21 DIAGNOSIS — J309 Allergic rhinitis, unspecified: Secondary | ICD-10-CM

## 2016-01-30 ENCOUNTER — Ambulatory Visit (INDEPENDENT_AMBULATORY_CARE_PROVIDER_SITE_OTHER): Payer: 59

## 2016-01-30 DIAGNOSIS — J309 Allergic rhinitis, unspecified: Secondary | ICD-10-CM | POA: Diagnosis not present

## 2016-02-04 ENCOUNTER — Ambulatory Visit (INDEPENDENT_AMBULATORY_CARE_PROVIDER_SITE_OTHER): Payer: 59 | Admitting: *Deleted

## 2016-02-04 DIAGNOSIS — J309 Allergic rhinitis, unspecified: Secondary | ICD-10-CM

## 2016-02-12 ENCOUNTER — Ambulatory Visit (INDEPENDENT_AMBULATORY_CARE_PROVIDER_SITE_OTHER): Payer: 59 | Admitting: *Deleted

## 2016-02-12 DIAGNOSIS — J309 Allergic rhinitis, unspecified: Secondary | ICD-10-CM

## 2016-02-28 ENCOUNTER — Ambulatory Visit (INDEPENDENT_AMBULATORY_CARE_PROVIDER_SITE_OTHER): Payer: 59 | Admitting: *Deleted

## 2016-02-28 DIAGNOSIS — J309 Allergic rhinitis, unspecified: Secondary | ICD-10-CM | POA: Diagnosis not present

## 2016-03-13 ENCOUNTER — Ambulatory Visit (INDEPENDENT_AMBULATORY_CARE_PROVIDER_SITE_OTHER): Payer: 59 | Admitting: *Deleted

## 2016-03-13 DIAGNOSIS — J309 Allergic rhinitis, unspecified: Secondary | ICD-10-CM | POA: Diagnosis not present

## 2016-03-24 DIAGNOSIS — J3089 Other allergic rhinitis: Secondary | ICD-10-CM | POA: Diagnosis not present

## 2016-03-25 ENCOUNTER — Ambulatory Visit (INDEPENDENT_AMBULATORY_CARE_PROVIDER_SITE_OTHER): Payer: 59 | Admitting: *Deleted

## 2016-03-25 DIAGNOSIS — J309 Allergic rhinitis, unspecified: Secondary | ICD-10-CM | POA: Diagnosis not present

## 2016-03-26 DIAGNOSIS — J301 Allergic rhinitis due to pollen: Secondary | ICD-10-CM | POA: Diagnosis not present

## 2016-03-27 DIAGNOSIS — J3081 Allergic rhinitis due to animal (cat) (dog) hair and dander: Secondary | ICD-10-CM | POA: Diagnosis not present

## 2016-04-15 ENCOUNTER — Ambulatory Visit (INDEPENDENT_AMBULATORY_CARE_PROVIDER_SITE_OTHER): Payer: 59

## 2016-04-15 DIAGNOSIS — J309 Allergic rhinitis, unspecified: Secondary | ICD-10-CM

## 2016-04-30 ENCOUNTER — Ambulatory Visit (INDEPENDENT_AMBULATORY_CARE_PROVIDER_SITE_OTHER): Payer: 59

## 2016-04-30 DIAGNOSIS — J309 Allergic rhinitis, unspecified: Secondary | ICD-10-CM

## 2016-05-16 ENCOUNTER — Ambulatory Visit (INDEPENDENT_AMBULATORY_CARE_PROVIDER_SITE_OTHER): Payer: 59

## 2016-05-16 DIAGNOSIS — J309 Allergic rhinitis, unspecified: Secondary | ICD-10-CM | POA: Diagnosis not present

## 2016-05-20 ENCOUNTER — Ambulatory Visit (INDEPENDENT_AMBULATORY_CARE_PROVIDER_SITE_OTHER): Payer: 59 | Admitting: *Deleted

## 2016-05-20 DIAGNOSIS — J309 Allergic rhinitis, unspecified: Secondary | ICD-10-CM | POA: Diagnosis not present

## 2016-05-29 ENCOUNTER — Ambulatory Visit (INDEPENDENT_AMBULATORY_CARE_PROVIDER_SITE_OTHER): Payer: 59 | Admitting: *Deleted

## 2016-05-29 DIAGNOSIS — J309 Allergic rhinitis, unspecified: Secondary | ICD-10-CM | POA: Diagnosis not present

## 2016-06-06 ENCOUNTER — Ambulatory Visit (INDEPENDENT_AMBULATORY_CARE_PROVIDER_SITE_OTHER): Payer: 59 | Admitting: *Deleted

## 2016-06-06 DIAGNOSIS — J309 Allergic rhinitis, unspecified: Secondary | ICD-10-CM

## 2016-06-10 ENCOUNTER — Ambulatory Visit (INDEPENDENT_AMBULATORY_CARE_PROVIDER_SITE_OTHER): Payer: 59 | Admitting: *Deleted

## 2016-06-10 DIAGNOSIS — J309 Allergic rhinitis, unspecified: Secondary | ICD-10-CM

## 2016-06-10 NOTE — Addendum Note (Signed)
Addended by: Felipa Emory on: 06/10/2016 10:45 AM   Modules accepted: Orders

## 2016-06-16 ENCOUNTER — Ambulatory Visit (INDEPENDENT_AMBULATORY_CARE_PROVIDER_SITE_OTHER): Payer: 59

## 2016-06-16 DIAGNOSIS — J309 Allergic rhinitis, unspecified: Secondary | ICD-10-CM

## 2016-06-17 ENCOUNTER — Telehealth: Payer: Self-pay | Admitting: Cardiovascular Disease

## 2016-06-17 NOTE — Telephone Encounter (Signed)
Rings w/o answer, or VM. No means to leave message.

## 2016-06-17 NOTE — Telephone Encounter (Signed)
New message     Pt c/o of Chest Pain: STAT if CP now or developed within 24 hours  1. Are you having CP right now? no  2. Are you experiencing any other symptoms (ex. SOB, nausea, vomiting, sweating)? Tired feeling. No other symptoms  3. How long have you been experiencing CP? The last month or two  4. Is your CP continuous or coming and going? Comes and goes  5. Have you taken Nitroglycerin? No   Pt scheduled appt for Friday at 3;45 pm with Dr. Oval Linsey ?

## 2016-06-18 ENCOUNTER — Encounter: Payer: Self-pay | Admitting: Allergy and Immunology

## 2016-06-18 ENCOUNTER — Ambulatory Visit (INDEPENDENT_AMBULATORY_CARE_PROVIDER_SITE_OTHER): Payer: 59 | Admitting: Allergy and Immunology

## 2016-06-18 ENCOUNTER — Encounter (INDEPENDENT_AMBULATORY_CARE_PROVIDER_SITE_OTHER): Payer: Self-pay

## 2016-06-18 VITALS — BP 104/78 | HR 56 | Resp 16 | Ht 71.26 in | Wt 201.0 lb

## 2016-06-18 DIAGNOSIS — K219 Gastro-esophageal reflux disease without esophagitis: Secondary | ICD-10-CM | POA: Diagnosis not present

## 2016-06-18 DIAGNOSIS — J3089 Other allergic rhinitis: Secondary | ICD-10-CM

## 2016-06-18 DIAGNOSIS — E291 Testicular hypofunction: Secondary | ICD-10-CM

## 2016-06-18 DIAGNOSIS — R5382 Chronic fatigue, unspecified: Secondary | ICD-10-CM | POA: Diagnosis not present

## 2016-06-18 DIAGNOSIS — J4540 Moderate persistent asthma, uncomplicated: Secondary | ICD-10-CM | POA: Diagnosis not present

## 2016-06-18 DIAGNOSIS — G9332 Myalgic encephalomyelitis/chronic fatigue syndrome: Secondary | ICD-10-CM

## 2016-06-18 DIAGNOSIS — G4733 Obstructive sleep apnea (adult) (pediatric): Secondary | ICD-10-CM

## 2016-06-18 NOTE — Patient Instructions (Addendum)
  1. Continue immunotherapy and EpiPen  2. Continue Symbicort 160 - 2 inhalations twice a day  3. Continue nasal flunisolide 1-2 sprays each nostril one time per day during upper airway symptoms  4. Continue Protonix 40 mg one time per day  5. Continue Zyrtec and albuterol HFA if needed  6. Consider mandibular advancement device for untreated sleep apnea  7. Have evaluation for fatigue including check of testosterone levels.  8. Return to clinic in 6 months or earlier if problem.

## 2016-06-18 NOTE — Progress Notes (Signed)
Follow-up Note  Referring Provider: Leighton Ruff, MD Primary Provider: Gerrit Heck, MD Date of Office Visit: 06/18/2016  Subjective:   Arthur Moore (DOB: Jul 05, 1950) is a 66 y.o. male who returns to the [facility] on 06/18/2016 in re-evaluation of the following:  HPI: Arthur Moore presents to this clinic in reevaluation of his asthma and allergic rhinoconjunctivitis and LPR. I've not seen him in this clinic since August 2016.  His respiratory tract has actually been doing very well while consistently using Symbicort and occasionally nasal flunisolide. He has not required a systemic steroid or an antibiotic to treat a respiratory tract issue. He rarely uses a short acting bronchodilator. He does not really exercise to any degree because of the fatigue issue.  His reflux is under excellent control. He has not been having any problems with his throat. He's been using Protonix one time per day.  He is fatigued and he feels worn down and achy as though he has a constant flu.Marland Kitchen Apparently he has had evaluation for this issue. It should be noted that he has untreated sleep apnea and cannot tolerate using a CPAP machine. As well, it should be noted that he apparently has hypogonadism and was receiving testosterone injections many years ago in the past but has not done so for several years. He was admitted last year for major depression.  Arthur Moore continues with immunotherapy. He is presently using this form of therapy every 2 weeks. He thinks that this is helped him tremendously regarding his atopic disease. He has not had an adverse effect from this treatment.  Allergies as of 06/18/2016      Reactions   Atorvastatin Other (See Comments)   Calf pain.      Medication List      albuterol 108 (90 Base) MCG/ACT inhaler Commonly known as:  PROVENTIL HFA;VENTOLIN HFA Inhale 1 puff into the lungs as needed for wheezing or shortness of breath.     budesonide-formoterol 160-4.5 MCG/ACT inhaler Commonly known as:  SYMBICORT Inhale 2 puffs into the lungs 2 (two) times daily.   cetirizine 10 MG tablet Commonly known as:  ZYRTEC Take 10 mg by mouth daily as needed for allergies.   metoprolol tartrate 25 MG tablet Commonly known as:  LOPRESSOR Take 25 mg by mouth daily.   pantoprazole 40 MG tablet Commonly known as:  PROTONIX   rosuvastatin 10 MG tablet Commonly known as:  CRESTOR       Past Medical History:  Diagnosis Date  . Aortic insufficiency   . Arthritis   . Asthma   . Cervical disc disease   . Depression   . Hyperlipidemia 08/24/2015  . Hypertension   . Lumbar disc disease   . Psoriasis     Past Surgical History:  Procedure Laterality Date  . CHOLECYSTECTOMY  2012  . HERNIA REPAIR    . PAROTIDECTOMY Right 1992  . PROSTATE SURGERY  2002, 2004  . SINOSCOPY    . SINUS EXPLORATION  2013  . SPINE SURGERY  2013   L C7-T1 laminectomy and discectomy  . TONSILLECTOMY      Review of systems negative except as noted in HPI / PMHx or noted below:  Review of Systems  Constitutional: Negative.   HENT: Negative.   Eyes: Negative.   Respiratory: Negative.   Cardiovascular: Negative.   Gastrointestinal: Negative.   Genitourinary: Negative.   Musculoskeletal: Negative.   Skin: Negative.   Neurological: Negative.   Endo/Heme/Allergies: Negative.   Psychiatric/Behavioral:  Negative.      Objective:   Vitals:   06/18/16 1027  BP: 104/78  Pulse: (!) 56  Resp: 16   Height: 5' 11.26" (181 cm)  Weight: 201 lb (91.2 kg)   Physical Exam  Constitutional: He is well-developed, well-nourished, and in no distress.  HENT:  Head: Normocephalic.  Right Ear: Tympanic membrane, external ear and ear canal normal.  Left Ear: Tympanic membrane, external ear and ear canal normal.  Nose: Nose normal. No mucosal edema or rhinorrhea.  Mouth/Throat: Uvula is midline, oropharynx is clear and moist and mucous membranes  are normal. No oropharyngeal exudate.  Eyes: Conjunctivae are normal.  Neck: Trachea normal. No tracheal tenderness present. No tracheal deviation present. No thyromegaly present.  Cardiovascular: Normal rate, regular rhythm, S1 normal, S2 normal and normal heart sounds.   No murmur heard. Pulmonary/Chest: Breath sounds normal. No stridor. No respiratory distress. He has no wheezes. He has no rales.  Musculoskeletal: He exhibits no edema.  Lymphadenopathy:       Head (right side): No tonsillar adenopathy present.       Head (left side): No tonsillar adenopathy present.    He has no cervical adenopathy.  Neurological: He is alert. Gait normal.  Skin: No rash noted. He is not diaphoretic. No erythema. Nails show no clubbing.  Psychiatric: Mood and affect normal.    Diagnostics:    Spirometry was performed and demonstrated an FEV1 of 2.83 at 80 % of predicted.  Results of blood tests obtained on 10/12/2015 refers to normal hepatic and renal function with a slightly elevated AST at 47 u/ML and a total bilirubin at 1.4 MG/DL. His white blood cell count was 9.1 without a differential and hemoglobin was 16.3 and platelet 157.  Assessment and Plan:   1. Asthma, moderate persistent, well-controlled   2. Other allergic rhinitis   3. LPRD (laryngopharyngeal reflux disease)   4. Sleep apnea, obstructive   5. Chronic fatigue disorder   6. Hypogonadism in male     1. Continue immunotherapy and EpiPen  2. Continue Symbicort 160 - 2 inhalations twice a day  3. Continue nasal flunisolide 1-2 sprays each nostril one time per day during upper airway symptoms  4. Continue Protonix 40 mg one time per day  5. Continue Zyrtec and albuterol HFA if needed  6. Consider mandibular advancement device for untreated sleep apnea  7. Have evaluation for fatigue including check of testosterone levels.  8. Return to clinic in 6 months or earlier if problem.  Arthur Moore is doing relatively well from a  respiratory standpoint. As well, his reflux is under good control. He will continue to use anti-inflammatory agents as noted above as well as immunotherapy and a proton pump inhibitor to address these issues. He does have fatigue and he has untreated sleep apnea and he apparently has hypogonadism and major depression probably contributing to his fatigue. I've asked him to discuss these issues with his primary care doctor. I'll attempt to obtain the previous blood test that have been performed in investigation of his fatigue. If he does well I will see him back in this clinic in 6 months or earlier if there is a problem.  Allena Katz, MD Allergy / [facility]ology [place]

## 2016-06-19 ENCOUNTER — Telehealth: Payer: Self-pay

## 2016-06-19 NOTE — Telephone Encounter (Signed)
-----   Message from Jiles Prows, MD sent at 06/19/2016  7:22 AM EST ----- Please obtain blood tests from primary care doctor for the past year.

## 2016-06-19 NOTE — Telephone Encounter (Signed)
Have faxed a request to patient's pcp office requesting these labs.

## 2016-06-20 ENCOUNTER — Encounter: Payer: Self-pay | Admitting: Cardiovascular Disease

## 2016-06-20 ENCOUNTER — Ambulatory Visit (INDEPENDENT_AMBULATORY_CARE_PROVIDER_SITE_OTHER): Payer: 59 | Admitting: Cardiovascular Disease

## 2016-06-20 VITALS — BP 115/71 | HR 64 | Ht 71.0 in | Wt 203.6 lb

## 2016-06-20 DIAGNOSIS — I351 Nonrheumatic aortic (valve) insufficiency: Secondary | ICD-10-CM | POA: Diagnosis not present

## 2016-06-20 DIAGNOSIS — I10 Essential (primary) hypertension: Secondary | ICD-10-CM | POA: Diagnosis not present

## 2016-06-20 DIAGNOSIS — I519 Heart disease, unspecified: Secondary | ICD-10-CM

## 2016-06-20 DIAGNOSIS — R079 Chest pain, unspecified: Secondary | ICD-10-CM | POA: Diagnosis not present

## 2016-06-20 DIAGNOSIS — R0789 Other chest pain: Secondary | ICD-10-CM | POA: Diagnosis not present

## 2016-06-20 DIAGNOSIS — E7800 Pure hypercholesterolemia, unspecified: Secondary | ICD-10-CM | POA: Diagnosis not present

## 2016-06-20 DIAGNOSIS — I5189 Other ill-defined heart diseases: Secondary | ICD-10-CM

## 2016-06-20 HISTORY — DX: Other ill-defined heart diseases: I51.89

## 2016-06-20 NOTE — Progress Notes (Signed)
Cardiology Office Note   Date:  06/20/2016  ID:  Arthur Moore, DOB [birth year], MRN [id number]  PCP:  Gerrit Heck, MD  Cardiologist:   Skeet Latch, MD   Chief Complaint  Patient presents with  . Follow-up  . Chest Pain    sharp pain     History of Present Illness: Arthur Moore is a 66 y.o. male with asthma,TIA, mild-moderate aortic insufficiency, hypertension, OSA not on hyperlipidemia, and depression who presents for follow up.  Arthur Moore reports neck pain due to prior cervical spine injury.  Arthur Moore wore a Holter monitor 06/2013 that revealed rare PACs and PVCs.  He previously followed up with a cardiologist in [place] due to mild AR.  He subsequently had an echo at the [place] in 2015 that reported it as mild-moderate.  He had a repeat echocardiogram 12/05/15 revealed LVEF 55-60% with grade 2 diastolic. Aortic regurgitation remained mild to moderate.    Since his last appointment Arthur Moore complaint is feeling tired.  He constantly feels like he has the flu.  This has been ongoing for the last 16 months.  He sleeps up to 18 hours per day.  He denies fever or chills.  He is also frustrated that he was diagnosed with 3 UTIs in the last 5 months.  He has been working with his PCP and reports normal thyroid function and vitamin D.  He is awaiting the results of testosterone levels.  He has also been working with his mental health providers at the [place].  He has been on 14 antidepressants over the last 20 years and took himself off them due to side effects.  Per his wife he stopped taking all medications for a long period of time but is now taking them again.  He tried to go to the gym last week for the first time in several months.  He felt very fatigued and had to stop after 15 minutes.  He reports intermittent episodes of chest pain that occur approximately three times per week.  This occurs when laying down, sitting or walking.  There is no associated  shortness of breath, nausea or diaphoresis.  He endorses orthopnea but notes that he has been unable to tolerate any CPAP mask.  He denies lower extremity edema.    Past Medical History:  Diagnosis Date  . Aortic insufficiency   . Arthritis   . Asthma   . Cervical disc disease   . Depression   . Diastolic dysfunction [id number]  . Hyperlipidemia 08/24/2015  . Hypertension   . Lumbar disc disease   . Psoriasis     Past Surgical History:  Procedure Laterality Date  . CHOLECYSTECTOMY  2012  . HERNIA REPAIR    . PAROTIDECTOMY Right 1992  . PROSTATE SURGERY  2002, 2004  . SINOSCOPY    . SINUS EXPLORATION  2013  . SPINE SURGERY  2013   L C7-T1 laminectomy and discectomy  . TONSILLECTOMY       Current Outpatient Prescriptions  Medication Sig Dispense Refill  . albuterol (PROVENTIL HFA;VENTOLIN HFA) 108 (90 Base) MCG/ACT inhaler Inhale 1 puff into the lungs as needed for wheezing or shortness of breath.    Marland Kitchen aspirin EC 81 MG tablet Take 81 mg by mouth daily.    . budesonide-formoterol (SYMBICORT) 160-4.5 MCG/ACT inhaler Inhale 2 puffs into the lungs 2 (two) times daily.    . cetirizine (ZYRTEC) 10 MG tablet Take 10 mg by mouth daily as needed for  allergies.    . metoprolol tartrate (LOPRESSOR) 25 MG tablet Take 25 mg by mouth daily.    . pantoprazole (PROTONIX) 40 MG tablet     . rosuvastatin (CRESTOR) 10 MG tablet      No current facility-administered medications for this visit.     Allergies:   Atorvastatin    Social History:  The patient  reports that he has never smoked. He has never used smokeless tobacco. He reports that he does not drink alcohol or use drugs.   Family History:  The patient's family history includes Colon cancer in his mother; Lung cancer in his father.    ROS:  Please see the history of present illness.   Otherwise, review of systems are positive for back pain.   All other systems are reviewed and negative.    PHYSICAL EXAM: VS:  BP 115/71    Pulse 64   Ht 5\' 11"  (1.803 m)   Wt 92.4 kg (203 lb 9.6 oz)   BMI 28.40 kg/m  , BMI Body mass index is 28.4 kg/m.  GENERAL:  Well appearing.  No acute distress. HEENT:  Pupils equal round and reactive, fundi not visualized, oral mucosa unremarkable NECK:  No jugular venous distention, waveform within normal limits, carotid upstroke brisk and symmetric, no bruits LYMPHATICS:  No cervical adenopathy LUNGS:  Clear to auscultation bilaterally HEART:  RRR.  PMI not displaced or sustained,S1 and S2 within normal limits, no S3, no S4, no clicks, no rubs, no murmurs ABD:  Flat, positive bowel sounds normal in frequency in pitch, no bruits, no rebound, no guarding, no midline pulsatile mass, no hepatomegaly, no splenomegaly EXT:  2 plus pulses throughout, no edema, no cyanosis no clubbing SKIN:  No rashes no nodules NEURO:  Cranial nerves II through XII grossly intact, motor grossly intact throughout PSYCH:  Cognitively intact, oriented to person place and time  EKG:  EKG is ordered today. 06/20/16: Sinus rhythm. Rate 64 bpm.  Echo 04/25/14: LVEF 60%.  Aortic valve sclerosis without stenosis. Mild aortic insufficiency.  ETT 04/2013:  Exercise 8 minutes and 44 seconds on a Bruce protocol. Peak heart rate 136. 9.8 METS. No ischemic changes.  24 Hour Holter: Underlying rhythm is sinus. Average heart rate 74, minimum 52, max 123. Rare PACs and PVCs. No significant arrhythmia noted.  Echo 12/05/15: Study Conclusions  - Left ventricle: Global LV longitudinal strain is normal at -16.2%   There is a false tendon in the LV apex of no clinical   significance. The cavity size was normal. There was moderate   focal basal hypertrophy. Systolic function was normal. The   estimated ejection fraction was in the range of 55% to 60%. Wall   motion was normal; there were no regional wall motion   abnormalities. Features are consistent with a pseudonormal left   ventricular filling pattern, with concomitant  abnormal relaxation   and increased filling pressure (grade 2 diastolic dysfunction). - Aortic valve: Trileaflet; mildly thickened leaflets. There was   mild to moderate regurgitation directed eccentrically in the LVOT   and towards the mitral anterior leaflet. - Mitral valve: There was mild regurgitation. - Pulmonic valve: There was mild regurgitation.   Recent Labs: 10/12/2015: ALT 59; BUN 17; Creatinine, Ser 1.14; Hemoglobin 16.3; Platelets 157; Potassium 3.6; Sodium 142    Lipid Panel No results found for: CHOL, TRIG, HDL, CHOLHDL, VLDL, LDLCALC, LDLDIRECT  11/2014: Total cholesterol 130, HDL 39, LDL 65, triglycerides 116  Total chol 133, tri 211,  ldl 55, hdl 34  Wt Readings from Last 3 Encounters:  06/20/16 92.4 kg (203 lb 9.6 oz)  06/18/16 91.2 kg (201 lb)  11/22/15 83 kg (183 lb)      ASSESSMENT AND PLAN:  # Atypical chest pain: Exercise Myoview.  Resume aspirin 81 mg daily.   # Hyperlipidemia:  Arthur Moore is tolerating rosuvastatin.  Lipids were recently checked with his PCP. LDL 55.  # Aortic regurgitation: Stable on echo 11/2015.  # Diastolic dysfunction: Grade 2 on echo.  No symptoms of heart failure.  BP controlled.    # Hypertension: BP well-controlled. Continue metoprolol.   # TIA: Continue statin and resume aspirin 81 mg daily.  # Depression: Arthur Moore is very depressed.  We discussed the importance of following up with his mental health provider.   I recommended that he start back exercising regularly.  Time spent: 45 minutes-Greater than 50% of this time was spent in counseling, explanation of diagnosis, planning of further management, and coordination of care.  Current medicines are reviewed at length with the patient today.  The patient does not have concerns regarding medicines.  The following changes have been made: None  Labs/ tests ordered today include:   Orders Placed This Encounter  Procedures  . Myocardial Perfusion Imaging  . EKG  12-Lead    Disposition:   FU with Kassadie Pancake C. Oval Linsey, MD in 6 months.    Signed, Skeet Latch, MD  06/20/2016 4:50 PM    [facility]

## 2016-06-20 NOTE — Patient Instructions (Addendum)
Medication Instructions:  Start aspirin   Labwork: none  Testing/Procedures: Your physician has requested that you have en exercise stress myoview. For further information please visit [email or web address]. Please follow instruction sheet, as given  Follow-Up: Your physician wants you to follow-up in: 6 month ov You will receive a reminder letter in the mail two months in advance. If you don't receive a letter, please call our office to schedule the follow-up appointment.  If you need a refill on your cardiac medications before your next appointment, please call your pharmacy.

## 2016-06-25 ENCOUNTER — Telehealth (HOSPITAL_COMMUNITY): Payer: Self-pay

## 2016-06-25 NOTE — Telephone Encounter (Signed)
Encounter complete. 

## 2016-06-26 ENCOUNTER — Ambulatory Visit (HOSPITAL_COMMUNITY)
Admission: RE | Admit: 2016-06-26 | Discharge: 2016-06-26 | Disposition: A | Discharge status: Home / Self Care | Payer: 59 | Source: Ambulatory Visit | Attending: Cardiology | Admitting: Cardiology

## 2016-06-26 DIAGNOSIS — R9439 Abnormal result of other cardiovascular function study: Secondary | ICD-10-CM | POA: Diagnosis not present

## 2016-06-26 DIAGNOSIS — R079 Chest pain, unspecified: Secondary | ICD-10-CM | POA: Diagnosis not present

## 2016-06-26 LAB — MYOCARDIAL PERFUSION IMAGING
Estimated workload: 7 METS
Exercise duration (min): 5 min
Exercise duration (sec): 0 s
LV dias vol: 97 mL (ref 62–150)
LV sys vol: 50 mL
MPHR: 155 {beats}/min
Peak HR: 151 {beats}/min
Percent HR: 97 %
RPE: 17
Rest HR: 81 {beats}/min
SDS: 5
SRS: 3
SSS: 8
TID: 0.96

## 2016-06-26 MED ORDER — TECHNETIUM TC 99M TETROFOSMIN IV KIT
29.9000 | PACK | Freq: Once | INTRAVENOUS | Status: AC | PRN
  Administered 2016-06-26: 29.9 via INTRAVENOUS
  Filled 2016-06-26: qty 30

## 2016-06-26 MED ORDER — TECHNETIUM TC 99M TETROFOSMIN IV KIT
10.2000 | PACK | Freq: Once | INTRAVENOUS | Status: AC | PRN
  Administered 2016-06-26: 10.2 via INTRAVENOUS
  Filled 2016-06-26: qty 11

## 2016-06-27 ENCOUNTER — Telehealth: Payer: Self-pay | Admitting: *Deleted

## 2016-06-27 NOTE — Telephone Encounter (Signed)
-----   Message from Skeet Latch, MD sent at 06/27/2016  4:24 PM EST ----- Low risk stress test.  This does not seem to be the cause of his chest discomfort.

## 2016-06-27 NOTE — Telephone Encounter (Signed)
Advised patient of myoview results   Patient concerned about the continued chest discomfort and would like to know what further testing needs to be done or what Dr Blenda Mounts feels next step should be  Will forward to Dr Oval Linsey for review

## 2016-07-01 ENCOUNTER — Ambulatory Visit: Payer: 59 | Admitting: Allergy and Immunology

## 2016-07-01 ENCOUNTER — Ambulatory Visit (INDEPENDENT_AMBULATORY_CARE_PROVIDER_SITE_OTHER): Payer: 59 | Admitting: *Deleted

## 2016-07-01 DIAGNOSIS — J309 Allergic rhinitis, unspecified: Secondary | ICD-10-CM

## 2016-07-02 ENCOUNTER — Telehealth: Payer: Self-pay | Admitting: *Deleted

## 2016-07-02 DIAGNOSIS — R7989 Other specified abnormal findings of blood chemistry: Secondary | ICD-10-CM

## 2016-07-02 DIAGNOSIS — G9331 Postviral fatigue syndrome: Secondary | ICD-10-CM

## 2016-07-02 DIAGNOSIS — G933 Postviral fatigue syndrome: Secondary | ICD-10-CM

## 2016-07-02 DIAGNOSIS — R945 Abnormal results of liver function studies: Secondary | ICD-10-CM

## 2016-07-02 NOTE — Telephone Encounter (Signed)
Called and spoke to patient about recent blood test. Informed patient of elevated LFT and decrease testosterone. Patient had not been informed of these results by his PCP. What would you like to be the next step? Please advise.

## 2016-07-02 NOTE — Telephone Encounter (Signed)
The fact that his symptoms don't happen consistently with exertion and his stress test is normal makes is very unlikely this is coming from his heart.  We could do a cardiac CT-A to completely rule it out.   Consider talking with PCP about other causes such as GERD.

## 2016-07-02 NOTE — Telephone Encounter (Signed)
Please have patient obtain Hepatitis B/C screen, anti-mitochodrial ab, anti-smooth muscle (actin) ab, LKM ab, ANA w/reflex, GGT. When did he start Crestor?

## 2016-07-04 NOTE — Telephone Encounter (Signed)
Patient made aware of recommendations.  Request we forward copy of stress test to PCP. Patient will call and make appt with them.    Patient verbalized understanding.    Stress test results forwarded to PCP per pt request.

## 2016-07-07 NOTE — Telephone Encounter (Signed)
Talked to patient and advised to go to [place] to get labs drawn. Orders put in. Also patient advised 3 years on Crestor was on another statin previously

## 2016-07-07 NOTE — Addendum Note (Signed)
Addended by: Carin Hock on: 07/07/2016 12:24 PM   Modules accepted: Orders

## 2016-07-08 ENCOUNTER — Telehealth: Payer: Self-pay | Admitting: Allergy and Immunology

## 2016-07-08 ENCOUNTER — Other Ambulatory Visit: Payer: Self-pay | Admitting: Allergy and Immunology

## 2016-07-08 DIAGNOSIS — R109 Unspecified abdominal pain: Secondary | ICD-10-CM

## 2016-07-08 DIAGNOSIS — R197 Diarrhea, unspecified: Secondary | ICD-10-CM

## 2016-07-08 NOTE — Telephone Encounter (Signed)
Dr. Neldon Mc, please advise if you would like for the salmonella lab to be added. Thanks!

## 2016-07-08 NOTE — Telephone Encounter (Signed)
Patient informs me that he went out to restaurants been developing significant gastrointestinal upset with some occasional diarrhea since then. This is been going on almost a week. 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 We will check a stool pathogen bacterial profile

## 2016-07-08 NOTE — Telephone Encounter (Signed)
Patient was calling to determine if a salmonella order couild be added to his labs today - and to determine if his labs were fasting labs Patient was informed that labs were not fasting, but that DR KOZLOW would be asked to add the extra lab and that a nurse will contact him about this Patient expressed that his wife has an appt today at 1:00 that he would be at with her and that appt

## 2016-07-10 LAB — HEPATITIS PANEL, ACUTE
Hep A IgM: NEGATIVE
Hep B C IgM: NEGATIVE
Hep C Virus Ab: 0.2 s/co ratio (ref 0.0–0.9)
Hepatitis B Surface Ag: NEGATIVE

## 2016-07-10 LAB — GAMMA GT: GGT: 199 IU/L — ABNORMAL HIGH (ref 0–65)

## 2016-07-10 LAB — ANTI-MICROSOMAL ANTIBODY LIVER / KIDNEY: LKM1 Ab: 3.6 Units (ref 0.0–20.0)

## 2016-07-10 LAB — MITOCHONDRIAL/SMOOTH MUSCLE AB PNL
Mitochondrial Ab: 13.5 Units (ref 0.0–20.0)
Smooth Muscle Ab: 16 Units (ref 0–19)

## 2016-07-10 LAB — ANA W/REFLEX IF POSITIVE: Anti Nuclear Antibody(ANA): NEGATIVE

## 2016-07-14 ENCOUNTER — Ambulatory Visit (INDEPENDENT_AMBULATORY_CARE_PROVIDER_SITE_OTHER): Payer: 59 | Admitting: *Deleted

## 2016-07-14 DIAGNOSIS — J309 Allergic rhinitis, unspecified: Secondary | ICD-10-CM

## 2016-07-14 LAB — STOOL CULTURE: E coli, Shiga toxin Assay: NEGATIVE

## 2016-07-18 NOTE — Telephone Encounter (Signed)
Patient has already been set up with GI Dr. Teena Irani.

## 2016-07-29 ENCOUNTER — Ambulatory Visit (INDEPENDENT_AMBULATORY_CARE_PROVIDER_SITE_OTHER): Payer: 59 | Admitting: *Deleted

## 2016-07-29 DIAGNOSIS — J309 Allergic rhinitis, unspecified: Secondary | ICD-10-CM | POA: Diagnosis not present

## 2016-07-31 DIAGNOSIS — J3089 Other allergic rhinitis: Secondary | ICD-10-CM | POA: Diagnosis not present

## 2016-08-14 ENCOUNTER — Ambulatory Visit (INDEPENDENT_AMBULATORY_CARE_PROVIDER_SITE_OTHER): Payer: 59

## 2016-08-14 DIAGNOSIS — J309 Allergic rhinitis, unspecified: Secondary | ICD-10-CM | POA: Diagnosis not present

## 2016-08-29 ENCOUNTER — Telehealth: Payer: Self-pay | Admitting: Gastroenterology

## 2016-08-29 NOTE — Telephone Encounter (Signed)
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  He will not see the pt in the office prior to the EUS.  After the EUS the pt can schedule a New office appt to discuss other issues (liver).  Other option would be to stay with [facility], Dr Paulita Fujita.  Pt states he would like to speak with his wife and think about the options and call back.

## 2016-09-01 ENCOUNTER — Ambulatory Visit (INDEPENDENT_AMBULATORY_CARE_PROVIDER_SITE_OTHER): Payer: 59

## 2016-09-01 DIAGNOSIS — J309 Allergic rhinitis, unspecified: Secondary | ICD-10-CM

## 2016-09-01 NOTE — Telephone Encounter (Signed)
I'm happy to see him for EUS at next available Thursday MAC appt which is later this month (5/24).  At that point will coordinate a future office visit to address any other GI issues.  Let me know what he decides, thanks.

## 2016-09-12 ENCOUNTER — Other Ambulatory Visit: Payer: Self-pay | Admitting: Gastroenterology

## 2016-09-12 DIAGNOSIS — K869 Disease of pancreas, unspecified: Secondary | ICD-10-CM

## 2016-09-16 ENCOUNTER — Ambulatory Visit (INDEPENDENT_AMBULATORY_CARE_PROVIDER_SITE_OTHER): Payer: 59 | Admitting: *Deleted

## 2016-09-16 DIAGNOSIS — J309 Allergic rhinitis, unspecified: Secondary | ICD-10-CM | POA: Diagnosis not present

## 2016-09-21 ENCOUNTER — Ambulatory Visit
Admission: RE | Admit: 2016-09-21 | Discharge: 2016-09-21 | Disposition: A | Discharge status: Home / Self Care | Payer: 59 | Source: Ambulatory Visit | Attending: Gastroenterology | Admitting: Gastroenterology

## 2016-09-21 DIAGNOSIS — K869 Disease of pancreas, unspecified: Secondary | ICD-10-CM

## 2016-09-21 MED ORDER — GADOBENATE DIMEGLUMINE 529 MG/ML IV SOLN
19.0000 mL | Freq: Once | INTRAVENOUS | Status: AC | PRN
  Administered 2016-09-21: 19 mL via INTRAVENOUS

## 2016-09-25 ENCOUNTER — Ambulatory Visit
Admission: RE | Admit: 2016-09-25 | Discharge: 2016-09-25 | Disposition: A | Discharge status: Home / Self Care | Payer: Self-pay | Source: Ambulatory Visit | Attending: Gastroenterology | Admitting: Gastroenterology

## 2016-09-25 ENCOUNTER — Other Ambulatory Visit: Payer: Self-pay | Admitting: Gastroenterology

## 2016-09-25 ENCOUNTER — Ambulatory Visit (INDEPENDENT_AMBULATORY_CARE_PROVIDER_SITE_OTHER): Payer: 59 | Admitting: *Deleted

## 2016-09-25 DIAGNOSIS — J309 Allergic rhinitis, unspecified: Secondary | ICD-10-CM

## 2016-09-25 DIAGNOSIS — R52 Pain, unspecified: Secondary | ICD-10-CM

## 2016-10-01 ENCOUNTER — Ambulatory Visit (INDEPENDENT_AMBULATORY_CARE_PROVIDER_SITE_OTHER): Payer: 59 | Admitting: *Deleted

## 2016-10-01 DIAGNOSIS — J309 Allergic rhinitis, unspecified: Secondary | ICD-10-CM | POA: Diagnosis not present

## 2016-10-08 ENCOUNTER — Ambulatory Visit (INDEPENDENT_AMBULATORY_CARE_PROVIDER_SITE_OTHER): Payer: 59 | Admitting: *Deleted

## 2016-10-08 DIAGNOSIS — J309 Allergic rhinitis, unspecified: Secondary | ICD-10-CM

## 2016-10-08 MED ORDER — EPINEPHRINE 0.3 MG/0.3ML IJ SOAJ: 0.3000 mg | Freq: Once | INTRAMUSCULAR | 0 refills | Status: AC

## 2016-10-16 ENCOUNTER — Ambulatory Visit (INDEPENDENT_AMBULATORY_CARE_PROVIDER_SITE_OTHER): Payer: 59

## 2016-10-16 DIAGNOSIS — J309 Allergic rhinitis, unspecified: Secondary | ICD-10-CM | POA: Diagnosis not present

## 2016-10-27 ENCOUNTER — Ambulatory Visit (INDEPENDENT_AMBULATORY_CARE_PROVIDER_SITE_OTHER): Payer: 59

## 2016-10-27 DIAGNOSIS — J309 Allergic rhinitis, unspecified: Secondary | ICD-10-CM

## 2016-11-14 ENCOUNTER — Ambulatory Visit (INDEPENDENT_AMBULATORY_CARE_PROVIDER_SITE_OTHER): Payer: 59

## 2016-11-14 DIAGNOSIS — J309 Allergic rhinitis, unspecified: Secondary | ICD-10-CM | POA: Diagnosis not present

## 2016-11-19 DIAGNOSIS — J3081 Allergic rhinitis due to animal (cat) (dog) hair and dander: Secondary | ICD-10-CM | POA: Diagnosis not present

## 2016-11-20 ENCOUNTER — Telehealth: Payer: Self-pay | Admitting: Hematology and Oncology

## 2016-11-20 ENCOUNTER — Encounter: Payer: Self-pay | Admitting: Hematology and Oncology

## 2016-11-20 DIAGNOSIS — J3089 Other allergic rhinitis: Secondary | ICD-10-CM | POA: Diagnosis not present

## 2016-11-20 NOTE — Telephone Encounter (Signed)
Appt has been scheduled for the pt to see Dr. Lebron Conners on 8/6 at 3pm. Pt aware to arrive 30 minutes early. Address and insurance verified. Letter mailed to the pt and faxed to the referring.

## 2016-11-21 ENCOUNTER — Ambulatory Visit
Admission: RE | Admit: 2016-11-21 | Discharge: 2016-11-21 | Disposition: A | Discharge status: Home / Self Care | Payer: 59 | Source: Ambulatory Visit | Attending: Gastroenterology | Admitting: Gastroenterology

## 2016-11-21 ENCOUNTER — Other Ambulatory Visit: Payer: Self-pay | Admitting: Gastroenterology

## 2016-11-21 DIAGNOSIS — R1032 Left lower quadrant pain: Secondary | ICD-10-CM

## 2016-11-21 DIAGNOSIS — J3081 Allergic rhinitis due to animal (cat) (dog) hair and dander: Secondary | ICD-10-CM | POA: Diagnosis not present

## 2016-11-21 MED ORDER — IOPAMIDOL (ISOVUE-300) INJECTION 61%: 100.0000 mL | Freq: Once | INTRAVENOUS | Status: DC | PRN

## 2016-11-24 ENCOUNTER — Ambulatory Visit (INDEPENDENT_AMBULATORY_CARE_PROVIDER_SITE_OTHER): Payer: 59 | Admitting: Cardiovascular Disease

## 2016-11-24 VITALS — BP 113/72 | HR 62 | Ht 72.0 in | Wt 192.6 lb

## 2016-11-24 DIAGNOSIS — I351 Nonrheumatic aortic (valve) insufficiency: Secondary | ICD-10-CM

## 2016-11-24 DIAGNOSIS — E7800 Pure hypercholesterolemia, unspecified: Secondary | ICD-10-CM | POA: Diagnosis not present

## 2016-11-24 DIAGNOSIS — I10 Essential (primary) hypertension: Secondary | ICD-10-CM

## 2016-11-24 DIAGNOSIS — R0789 Other chest pain: Secondary | ICD-10-CM

## 2016-11-24 NOTE — Patient Instructions (Signed)
Medication Instructions:  Your physician recommends that you continue on your current medications as directed. Please refer to the Current Medication list given to you today.  Labwork: none  Testing/Procedures: Your physician has requested that you have an echocardiogram. Echocardiography is a painless test that uses sound waves to create images of your heart. It provides your doctor with information about the size and shape of your heart and how well your heart's chambers and valves are working. This procedure takes approximately one hour. There are no restrictions for this procedure. 1 year  [place] [address]  Follow-Up: Your physician wants you to follow-up in: 1 YEAR AFTER ECHO  You will receive a reminder letter in the mail two months in advance. If you don't receive a letter, please call our office to schedule the follow-up appointment.  If you need a refill on your cardiac medications before your next appointment, please call your pharmacy.

## 2016-11-24 NOTE — Progress Notes (Signed)
Cardiology Office Note   Date:  11/24/2016  ID:  Arthur Moore, DOB 24-Mar-1951, MRN [id number]  PCP:  Leighton Ruff, MD  Cardiologist:   Skeet Latch, MD   Chief Complaint  Patient presents with  . Follow-up    12 months;     History of Present Illness: Arthur Moore is a 66 y.o. male with asthma,TIA, mild-moderate aortic insufficiency, hypertension, OSA not on CPAP, and depression who presents for follow up.  Arthur Moore reports neck pain due to prior cervical spine injury.  Arthur Moore wore a Holter monitor 06/2013 that revealed rare PACs and PVCs.  He previously followed up with a cardiologist in [place] due to mild AR.  He subsequently had an echo at the [place] in 2015 that reported it as mild-moderate.  He had a repeat echocardiogram 12/05/15 revealed LVEF 55-60% with grade 2 diastolic. Aortic regurgitation remained mild to moderate.    At his last appointment Arthur Moore reported fatigue and atypical chest pain.  He was referred for an exercise Myoview/1/18 that revealed LVEF 49% and asynchronous contraction. There was a small defect in the basal inferior region that was thought to be diaphragmatic attenuation.  He achieved 7 METS on a Bruce protocol.  He called our office after these results due to persistent chest pain and it was suggested that he follow up with GI.  Since his last appointment Arthur Moore was diagnosed with diverticulitis.  Other than this he has been doing well.  He denies chest pain or shortness of breath. He denies lower extremity edema, orthopnea, or PND. He continues to be unable to tolerate the CPAP. His main complaint today is overwhelming depression. He is unable to get motivated to exercise. He continues to work with the [place] psychiatrist and we'll be starting lithium as a mood stabilizer.   Past Medical History:  Diagnosis Date  . Aortic insufficiency   . Arthritis   . Asthma   . Cervical disc disease   . Depression   . Diastolic  dysfunction 3/81/8299  . Hyperlipidemia 08/24/2015  . Hypertension   . Lumbar disc disease   . Psoriasis     Past Surgical History:  Procedure Laterality Date  . CHOLECYSTECTOMY  2012  . HERNIA REPAIR    . PAROTIDECTOMY Right 1992  . PROSTATE SURGERY  2002, 2004  . SINOSCOPY    . SINUS EXPLORATION  2013  . SPINE SURGERY  2013   L C7-T1 laminectomy and discectomy  . TONSILLECTOMY       Current Outpatient Prescriptions  Medication Sig Dispense Refill  . albuterol (PROVENTIL HFA;VENTOLIN HFA) 108 (90 Base) MCG/ACT inhaler Inhale 1 puff into the lungs as needed for wheezing or shortness of breath.    Marland Kitchen aspirin EC 81 MG tablet Take 81 mg by mouth daily.    . budesonide-formoterol (SYMBICORT) 160-4.5 MCG/ACT inhaler Inhale 2 puffs into the lungs 2 (two) times daily.    . cetirizine (ZYRTEC) 10 MG tablet Take 10 mg by mouth daily as needed for allergies.    . ciprofloxacin (CIPRO) 500 MG tablet Take 500 mg by mouth 2 (two) times daily. for 10 days  0  . metoprolol tartrate (LOPRESSOR) 25 MG tablet Take 25 mg by mouth daily.    . metroNIDAZOLE (FLAGYL) 500 MG tablet TAKE 1 TABLET BY MOUTH THREE TIMES A DAY FOR 10 DAYS  0  . pantoprazole (PROTONIX) 40 MG tablet     . rosuvastatin (CRESTOR) 10 MG tablet  No current facility-administered medications for this visit.     Allergies:   Atorvastatin    Social History:  The patient  reports that he has never smoked. He has never used smokeless tobacco. He reports that he does not drink alcohol or use drugs.   Family History:  The patient's family history includes Colon cancer in his mother; Lung cancer in his father.    ROS:  Please see the history of present illness.   Otherwise, review of systems are positive for none.   All other systems are reviewed and negative.    PHYSICAL EXAM: VS:  BP 113/72   Pulse 62   Ht 6' (1.829 m)   Wt 87.4 kg (192 lb 9.6 oz)   BMI 26.12 kg/m  , BMI Body mass index is 26.12 kg/m. GENERAL:  Well  appearing.  No acute distress HEENT:  Pupils equal round and reactive, fundi not visualized, oral mucosa unremarkable NECK:  No jugular venous distention, waveform within normal limits, carotid upstroke brisk and symmetric, no bruits, no thyromegaly LUNGS:  Clear to auscultation bilaterally HEART:  RRR.  PMI not displaced or sustained,S1 and S2 within normal limits, no S3, no S4, no clicks, no rubs, no murmurs ABD:  Flat, positive bowel sounds normal in frequency in pitch, no bruits, no rebound, no guarding, no midline pulsatile mass, no hepatomegaly, no splenomegaly EXT:  2 plus pulses throughout, no edema, no cyanosis no clubbing SKIN:  No rashes no nodules NEURO:  Cranial nerves II through XII grossly intact, motor grossly intact throughout PSYCH:  Cognitively intact, oriented to person place and time  EKG:  EKG is not ordered today. 06/20/16: Sinus rhythm. Rate 64 bpm.  Echo 04/25/14: LVEF 60%.  Aortic valve sclerosis without stenosis. Mild aortic insufficiency.  ETT 04/2013:  Exercise 8 minutes and 44 seconds on a Bruce protocol. Peak heart rate 136. 9.8 METS. No ischemic changes.  24 Hour Holter: Underlying rhythm is sinus. Average heart rate 74, minimum 52, max 123. Rare PACs and PVCs. No significant arrhythmia noted.  Echo 12/05/15: Study Conclusions  - Left ventricle: Global LV longitudinal strain is normal at -16.2%   There is a false tendon in the LV apex of no clinical   significance. The cavity size was normal. There was moderate   focal basal hypertrophy. Systolic function was normal. The   estimated ejection fraction was in the range of 55% to 60%. Wall   motion was normal; there were no regional wall motion   abnormalities. Features are consistent with a pseudonormal left   ventricular filling pattern, with concomitant abnormal relaxation   and increased filling pressure (grade 2 diastolic dysfunction). - Aortic valve: Trileaflet; mildly thickened leaflets. There was    mild to moderate regurgitation directed eccentrically in the LVOT   and towards the mitral anterior leaflet. - Mitral valve: There was mild regurgitation. - Pulmonic valve: There was mild regurgitation.   Exercise Myoview 06/26/16: Nuclear stress EF: 49%. Asynchronous contraction.  The left ventricular ejection fraction is mildly decreased (45-54%).  There was no ST segment deviation noted during stress.  Defect 1: There is a small defect of mild severity present in the basal inferior location. Likely diaphragmatic attenuation artifact.  This is a low risk study. No ischemia identified   Recent Labs: No results found for requested labs within last 8760 hours.    Lipid Panel No results found for: CHOL, TRIG, HDL, CHOLHDL, VLDL, LDLCALC, LDLDIRECT   11/2014: Total cholesterol 130, HDL  39, LDL 65, triglycerides 116  Total chol 133, tri 211, ldl 55, hdl 34  Wt Readings from Last 3 Encounters:  11/24/16 87.4 kg (192 lb 9.6 oz)  06/26/16 92.1 kg (203 lb)  06/20/16 92.4 kg (203 lb 9.6 oz)      ASSESSMENT AND PLAN:  # Atypical chest pain: Resolved.  Exercise Myoview was negative for ischemia presents to thousand 18.  # Hyperlipidemia:  Mr. Mia is tolerating rosuvastatin well.  No changes.   # Aortic regurgitation: Stable on echo 11/2015. Repeat echo 11/2017.   # Diastolic dysfunction: Grade 2 on echo.  No symptoms of heart failure.  BP remains controlled.    # Hypertension: BP well-controlled. Continue metoprolol.   # TIA: Continue statin and aspirin 81 mg daily.  # Depression:  Arthur Moore remains very depressed. He will be starting lithium soon. We also discussed exercise as a treatment for his depression.   Time spent: 35 minutes-Greater than 50% of this time was spent in counseling, explanation of diagnosis, planning of further management, and coordination of care.  Current medicines are reviewed at length with the patient today.  The patient does not have  concerns regarding medicines.  The following changes have been made: None  Labs/ tests ordered today include:   Orders Placed This Encounter  Procedures  . ECHOCARDIOGRAM COMPLETE    Disposition:   FU with Tamana Hatfield C. Oval Linsey, MD in 1 year   Signed, Skeet Latch, MD  11/24/2016 6:43 PM    [place]

## 2016-12-01 ENCOUNTER — Ambulatory Visit (HOSPITAL_BASED_OUTPATIENT_CLINIC_OR_DEPARTMENT_OTHER): Payer: 59 | Admitting: Hematology and Oncology

## 2016-12-01 ENCOUNTER — Ambulatory Visit: Payer: 59

## 2016-12-01 ENCOUNTER — Encounter: Payer: Self-pay | Admitting: Hematology and Oncology

## 2016-12-01 VITALS — BP 110/75 | HR 66 | Temp 98.8°F | Resp 20 | Ht 72.0 in | Wt 189.3 lb

## 2016-12-01 DIAGNOSIS — I10 Essential (primary) hypertension: Secondary | ICD-10-CM | POA: Diagnosis not present

## 2016-12-01 DIAGNOSIS — Z801 Family history of malignant neoplasm of trachea, bronchus and lung: Secondary | ICD-10-CM

## 2016-12-01 DIAGNOSIS — Z803 Family history of malignant neoplasm of breast: Secondary | ICD-10-CM

## 2016-12-01 DIAGNOSIS — D696 Thrombocytopenia, unspecified: Secondary | ICD-10-CM

## 2016-12-01 DIAGNOSIS — L4050 Arthropathic psoriasis, unspecified: Secondary | ICD-10-CM | POA: Diagnosis not present

## 2016-12-01 DIAGNOSIS — R740 Nonspecific elevation of levels of transaminase and lactic acid dehydrogenase [LDH]: Secondary | ICD-10-CM

## 2016-12-01 DIAGNOSIS — R7401 Elevation of levels of liver transaminase levels: Secondary | ICD-10-CM

## 2016-12-01 DIAGNOSIS — R630 Anorexia: Secondary | ICD-10-CM | POA: Diagnosis not present

## 2016-12-01 DIAGNOSIS — R5383 Other fatigue: Secondary | ICD-10-CM | POA: Diagnosis not present

## 2016-12-01 DIAGNOSIS — R17 Unspecified jaundice: Secondary | ICD-10-CM

## 2016-12-01 NOTE — Assessment & Plan Note (Signed)
66 year old male with very mild thrombocytopenia and no evidence of bleeding. No significant tenderness at his in the white blood cell and red blood cell lineages. In the context of concurrent hepatic abnormalities, this thrombocytopenia is likely due to liver dysfunction, but may represent other processes as well. This time, I believe that liver abnormalities present a more significant diagnostic and therapeutic challenge and need to be further evaluated.  Plan: --Repeat CBC/diff today to assess for interval change over the past 2 months --We'll discuss with the patient more over the phone with his request.

## 2016-12-01 NOTE — Progress Notes (Signed)
[facility] New Visit:  Assessment: Thrombocytopenia ([place]) 66 year old male with very mild thrombocytopenia and no evidence of bleeding. No significant tenderness at his in the white blood cell and red blood cell lineages. In the context of concurrent hepatic abnormalities, this thrombocytopenia is likely due to liver dysfunction, but may represent other processes as well. This time, I believe that liver abnormalities present a more significant diagnostic and therapeutic challenge and need to be further evaluated.  Plan: --Repeat CBC/diff today to assess for interval change over the past 2 months --We'll discuss with the patient more over the phone with his request.  Transaminitis Significant transaminitis with hyperbilirubinemia and predominant hepatocellular pattern noted on the labs on 09/25/2016. Bilirubin was as high as 1.9 with AST of 106 and ALT of 92 from normal baseline previously. Patient does not drink alcohol to excess. He does a statin and some other medications with potential to cause hepatic injury.  At this time, this finding is more concerning to me that his mild thrombocytopenia which is likely secondary to the hepatic injury. Differential is broad and includes toxicity of a medication versus viral infection such as CMV, or EBV as the patient has recently been checked for potential viral hepatitis for hepatitis C, been to see with old tests negative. Infection with CMV or EBV would explain the overall symptoms of loss of appetite, and fatigue. Additionally, we cannot exclude an autoimmune process such as autoimmune hepatitis or a primary sclerosing cholangitis although alkaline phosphatase has been stable and normal. Patient has diagnosis of psoriatic arthritis, but it may actually represent a different autoimmune disorder with shifting pattern of and organ damage. With that in mind, additional labs will be obtained today.  Plan: --Labs today as outlined  below. --Patient will contact us to review results by phone tomorrow.  Voice recognition software was used and creation of this note. Despite my best effort at editing the text, some misspelling/errors may have occurred.   Orders Placed This Encounter  Procedures  . CBC & Diff and Retic    Standing Status:   Future    Standing Expiration Date:   12/01/2017  . Morphology    Standing Status:   Future    Standing Expiration Date:   12/01/2017  . Comprehensive metabolic panel    Standing Status:   Future    Standing Expiration Date:   12/01/2017  . Lactate dehydrogenase (LDH)    Standing Status:   Future    Standing Expiration Date:   12/01/2017  . Haptoglobin    Standing Status:   Future    Standing Expiration Date:   12/01/2017  . Ferritin    Standing Status:   Future    Standing Expiration Date:   12/01/2017  . Iron and TIBC    Standing Status:   Future    Standing Expiration Date:   12/01/2017  . Sedimentation rate    Standing Status:   Future    Standing Expiration Date:   12/01/2017  . C-reactive protein    Standing Status:   Future    Standing Expiration Date:   12/01/2017  . ANA, IFA (with reflex)    Standing Status:   Future    Standing Expiration Date:   12/01/2017  . Rheumatoid factor    Standing Status:   Future    Standing Expiration Date:   12/01/2017  . Hepatitis A antibody, total    Standing Status:   Future    Standing Expiration  Date:   12/01/2017  . Hepatitis B surface antibody    Standing Status:   Future    Standing Expiration Date:   12/01/2017  . Hepatitis B surface antigen    Standing Status:   Future    Standing Expiration Date:   12/01/2017  . Hepatitis B core antibody, IgM    Standing Status:   Future    Standing Expiration Date:   12/01/2017  . Hepatitis C antibody (reflex if positive)    Standing Status:   Future    Standing Expiration Date:   12/01/2017  . CMV IgM    Standing Status:   Future    Standing Expiration Date:   12/01/2017  . CMV antibody, IgG (EIA)     Standing Status:   Future    Standing Expiration Date:   12/01/2017  . Epstein-Barr virus VCA, IgG    Standing Status:   Future    Standing Expiration Date:   12/01/2017  . Epstein-Barr virus VCA, IgM    Standing Status:   Future    Standing Expiration Date:   12/01/2017  . Epstein-Barr virus early D antigen antibody, IgG    Standing Status:   Future    Standing Expiration Date:   12/01/2017  . Epstein-Barr virus nuclear antigen antibody, IgG    Standing Status:   Future    Standing Expiration Date:   12/01/2017  . Direct antiglobulin test ([place])    Standing Status:   Future    Standing Expiration Date:   12/01/2017    All questions were answered.  . The patient knows to call the clinic with any problems, questions or concerns.  This note was electronically signed.    History of Presenting Illness Arthur Moore 66 y.o. presenting to the [facility] for evaluation of thrombocytopenia. Patient's past medical history is significant for psoriatic arthritis without dermatological evidence of psoriasis, history of TIA and retinal artery occlusion, hypertension, PTSD/anxiety, BPH. Previously, patient has been treated with methotrexate for the psoriatic arthritis. It has since been discontinued. At the present time, patient's mainly complaining of very low energy, increasing fatigue, and profound decrease in the appetite over the past 2 years. He does not have any early satiety, but does appear to have some food aversion. She has had no bleeding events. He denies easy bruisability, epistaxis, gum bleeding, hematemesis, hemoptysis, hematochezia, melena, or hematuria. Denies petechiae, or ecchymosis.  Patient denies active respiratory symptoms. He is currently recovering from a bilobed of diverticulitis he developed in July. It was treated conservatively based on absence of abscess formation and perforation. Patient denies any new medications, denies previous history of blood transfusions, intravenous  drug use, he does acknowledge monogamous relationship for a number of years.  Oncological/hematological History: --Labs, 11/22/15: WBC 8.8, Hgb 16.7, MCV 86.0, MCH 30.3, RDW 14.6, Plt 165; tBili 0.8, tProt 6.8, Alb 4.4, AP 72, AST 31, ALT 49;  --MRI Abdomen, 09/21/16: Mild hepatic steatosis, previous cholecystectomy, no biliary dilation. Normal appearance of the pancreas without evidence of pancreatitis or mass lesion. Multiple appearance of the spleen. No retroperitoneal lymphadenopathy. --Labs, 09/25/16: WBC 5.8, Hgb 16.1, MCV 88.0, MCH 30.6, RDW 13.6, Plt 117; tBili 1.9, tProt 6.6, Alb 4.5, AP 75, AST 106, ALT 92; TSH 3.32; --CT A/P, 11/22/15: No evidence of splenomegaly, improving appearance of hepatic steatosis. Left-sided bowel diverticulitis without perforation or abscess formation. Medical History: Past Medical History:  Diagnosis Date  . Aortic insufficiency   . Arthritis   . Asthma   .  Cervical disc disease   . Depression   . Diastolic dysfunction 1/61/0960  . Hyperlipidemia 08/24/2015  . Hypertension   . Lumbar disc disease   . Psoriasis     Surgical History: Past Surgical History:  Procedure Laterality Date  . CHOLECYSTECTOMY  2012  . HERNIA REPAIR    . PAROTIDECTOMY Right 1992  . PROSTATE SURGERY  2002, 2004  . SINOSCOPY    . SINUS EXPLORATION  2013  . SPINE SURGERY  2013   L C7-T1 laminectomy and discectomy  . TONSILLECTOMY      Family History: Family History  Problem Relation Age of Onset  . Colon cancer Mother   . Lung cancer Father     Social History: Social History   Social History  . Marital status: Married    Spouse name: N/A  . Number of children: N/A  . Years of education: N/A   Occupational History  . Not on file.   Social History Main Topics  . Smoking status: Never Smoker  . Smokeless tobacco: Never Used  . Alcohol use No  . Drug use: No  . Sexual activity: Not on file   Other Topics Concern  . Not on file   Social History  Narrative  . No narrative on file    Allergies: Allergies  Allergen Reactions  . Atorvastatin Other (See Comments)    Calf pain.    Medications:  Current Outpatient Prescriptions  Medication Sig Dispense Refill  . albuterol (PROVENTIL HFA;VENTOLIN HFA) 108 (90 Base) MCG/ACT inhaler Inhale 1 puff into the lungs as needed for wheezing or shortness of breath.    Marland Kitchen aspirin EC 81 MG tablet Take 81 mg by mouth daily.    . budesonide-formoterol (SYMBICORT) 160-4.5 MCG/ACT inhaler Inhale 2 puffs into the lungs 2 (two) times daily.    . cetirizine (ZYRTEC) 10 MG tablet Take 10 mg by mouth daily as needed for allergies.    . metoprolol tartrate (LOPRESSOR) 25 MG tablet Take 25 mg by mouth daily.    . pantoprazole (PROTONIX) 40 MG tablet     . rosuvastatin (CRESTOR) 10 MG tablet      No current facility-administered medications for this visit.     Review of Systems: Review of Systems  Constitutional: Positive for appetite change and fatigue. Negative for chills, diaphoresis, fever and unexpected weight change.  HENT:  Negative.   Eyes: Negative.   Respiratory: Negative.   Cardiovascular: Negative.   Gastrointestinal: Positive for abdominal pain, diarrhea and nausea. Negative for abdominal distention, blood in stool, constipation, rectal pain and vomiting.  Endocrine: Negative for hot flashes.  Genitourinary: Negative.    Musculoskeletal: Negative.   Skin: Negative.   Neurological: Negative.   Hematological: Negative.   Psychiatric/Behavioral: Positive for depression. Negative for confusion, decreased concentration, sleep disturbance and suicidal ideas. The patient is nervous/anxious.   All other systems reviewed and are negative.    PHYSICAL EXAMINATION Blood pressure 110/75, pulse 66, temperature 98.8 F (37.1 C), temperature source Oral, resp. rate 20, height 6' (1.829 m), weight 189 lb 4.8 oz (85.9 kg), SpO2 96 %.  ECOG PERFORMANCE STATUS: 1 - Symptomatic but completely  ambulatory  Physical Exam  Constitutional: He is oriented to person, place, and time. He appears distressed.  HENT:  Head: Normocephalic and atraumatic.  Mouth/Throat: No oropharyngeal exudate.  Eyes: Pupils are equal, round, and reactive to light. Conjunctivae and EOM are normal. No scleral icterus.  Neck: Normal range of motion. Neck supple. No thyromegaly present.  Cardiovascular: Normal rate, regular rhythm and normal heart sounds.   No murmur heard. Pulmonary/Chest: Effort normal and breath sounds normal. No respiratory distress. He has no wheezes. He has no rales.  Abdominal: Soft. Bowel sounds are normal. He exhibits no distension and no mass. There is tenderness. There is no rebound and no guarding.  Left abdominal and left upper quadrant tenderness.  Musculoskeletal: He exhibits no edema or tenderness.  Lymphadenopathy:    He has no cervical adenopathy.  Neurological: He is alert and oriented to person, place, and time. He has normal reflexes. GCS score is 15.  Skin: Skin is warm and dry. No rash noted. He is not diaphoretic. No erythema. No pallor.  No petechiae or ecchymosis.     LABORATORY DATA: I have personally reviewed the data as listed: No visits with results within 1 Week(s) from this visit.  Latest known visit with results is:  Orders Only on 07/08/2016  Component Date Value Ref Range Status  . Salmonella/Shigella Screen 07/08/2016 Final report   Final  . Stool Culture result 1 (RSASHR) 07/08/2016 Comment   Final   No Salmonella or Shigella recovered.  . Campylobacter Culture 07/08/2016 Final report   Final  . Stool Culture result 1 (CMPCXR) 07/08/2016 Comment   Final   No Campylobacter species isolated.  . E coli, Shiga toxin Assay 07/08/2016 Negative  Negative Final         Ardath Sax, MD

## 2016-12-01 NOTE — Patient Instructions (Signed)
Thank you for choosing [facility] to provide your oncology and hematology care.  To afford each patient quality time with our providers, please arrive 30 minutes before your scheduled appointment time.  If you arrive late for your appointment, you may be asked to reschedule.  We strive to give you quality time with our providers, and arriving late affects you and other patients whose appointments are after yours.   If you are a no show for multiple scheduled visits, you may be dismissed from the clinic at the providers discretion.    Again, thank you for choosing [facility], our hope is that these requests will decrease the amount of time that you wait before being seen by our physicians.  ______________________________________________________________________  Should you have questions after your visit to the [facility], please contact our office at [phone] between the hours of 8:30 and 4:30 p.m.    Voicemails left after 4:30p.m will not be returned until the following business day.    For prescription refill requests, please have your pharmacy contact us directly.  Please also try to allow 48 hours for prescription requests.    Please contact the scheduling department for questions regarding scheduling.  For scheduling of procedures such as PET scans, CT scans, MRI, Ultrasound, etc please contact central scheduling at [phone].    Resources For Cancer Patients and Caregivers:   Oncolink.org:  A wonderful resource for patients and healthcare providers for information regarding your disease, ways to tract your treatment, what to expect, etc.     American Cancer Society:  800-227-2345  Can help patients locate various types of support and financial assistance  Cancer Care: 1-800-813-HOPE (4673) Provides financial assistance, online support groups, medication/co-pay assistance.    [place] DSS:  336-641-3447 Where to apply for food  stamps, Medicaid, and utility assistance  Medicare Rights Center: 800-333-4114 Helps people with Medicare understand their rights and benefits, navigate the Medicare system, and secure the quality healthcare they deserve  SCAT: 336-333-6589 [place] [employer]'s shared-ride transportation service for eligible riders who have a disability that prevents them from riding the fixed route bus.    For additional information on assistance programs please contact our social worker:   Grier Hock/Abigail Elmore:  [phone]            

## 2016-12-01 NOTE — Assessment & Plan Note (Signed)
Significant transaminitis with hyperbilirubinemia and predominant hepatocellular pattern noted on the labs on 09/25/2016. Bilirubin was as high as 1.9 with AST of 106 and ALT of 92 from normal baseline previously. Patient does not drink alcohol to excess. He does a statin and some other medications with potential to cause hepatic injury.  At this time, this finding is more concerning to me that his mild thrombocytopenia which is likely secondary to the hepatic injury. Differential is broad and includes toxicity of a medication versus viral infection such as CMV, or EBV as the patient has recently been checked for potential viral hepatitis for hepatitis C, been to see with old tests negative. Infection with CMV or EBV would explain the overall symptoms of loss of appetite, and fatigue. Additionally, we cannot exclude an autoimmune process such as autoimmune hepatitis or a primary sclerosing cholangitis although alkaline phosphatase has been stable and normal. Patient has diagnosis of psoriatic arthritis, but it may actually represent a different autoimmune disorder with shifting pattern of and organ damage. With that in mind, additional labs will be obtained today.  Plan: --Labs today as outlined below. --Patient will contact us to review results by phone tomorrow.  Voice recognition software was used and creation of this note. Despite my best effort at editing the text, some misspelling/errors may have occurred.

## 2016-12-02 ENCOUNTER — Other Ambulatory Visit (HOSPITAL_COMMUNITY): Payer: 59

## 2016-12-04 ENCOUNTER — Telehealth: Payer: Self-pay | Admitting: *Deleted

## 2016-12-04 NOTE — Telephone Encounter (Signed)
Pt called requesting lab orders be sent to [facility].  Dr. Lebron Conners notified, all lab orders changed to [facility] resulting agency.  Pt called again to inform that [facility] has not received orders.  Pt provided fax number.  This RN printed out lab requisition forms and sent all lab orders via fax to number provided:  [phone].  Fax confirmation received.

## 2016-12-04 NOTE — Addendum Note (Signed)
Addended by: Ardath Sax on: 12/04/2016 09:15 AM   Modules accepted: Orders

## 2016-12-05 ENCOUNTER — Ambulatory Visit (INDEPENDENT_AMBULATORY_CARE_PROVIDER_SITE_OTHER): Payer: 59

## 2016-12-05 ENCOUNTER — Encounter: Payer: Self-pay | Admitting: Allergy and Immunology

## 2016-12-05 DIAGNOSIS — J309 Allergic rhinitis, unspecified: Secondary | ICD-10-CM

## 2016-12-09 ENCOUNTER — Ambulatory Visit (HOSPITAL_BASED_OUTPATIENT_CLINIC_OR_DEPARTMENT_OTHER): Payer: 59

## 2016-12-09 ENCOUNTER — Telehealth: Payer: Self-pay | Admitting: *Deleted

## 2016-12-09 ENCOUNTER — Telehealth: Payer: Self-pay

## 2016-12-09 DIAGNOSIS — D696 Thrombocytopenia, unspecified: Secondary | ICD-10-CM

## 2016-12-09 DIAGNOSIS — R740 Nonspecific elevation of levels of transaminase and lactic acid dehydrogenase [LDH]: Secondary | ICD-10-CM

## 2016-12-09 DIAGNOSIS — R7401 Elevation of levels of liver transaminase levels: Secondary | ICD-10-CM

## 2016-12-09 DIAGNOSIS — R17 Unspecified jaundice: Secondary | ICD-10-CM

## 2016-12-09 LAB — CBC & DIFF AND RETIC
BASO%: 0.3 % (ref 0.0–2.0)
Basophils Absolute: 0 10*3/uL (ref 0.0–0.1)
EOS%: 5 % (ref 0.0–7.0)
Eosinophils Absolute: 0.3 10*3/uL (ref 0.0–0.5)
HCT: 46.9 % (ref 38.4–49.9)
HGB: 16.7 g/dL (ref 13.0–17.1)
Immature Retic Fract: 4.1 % (ref 3.00–10.60)
LYMPH%: 25.3 % (ref 14.0–49.0)
MCH: 31.1 pg (ref 27.2–33.4)
MCHC: 35.6 g/dL (ref 32.0–36.0)
MCV: 87.3 fL (ref 79.3–98.0)
MONO#: 0.4 10*3/uL (ref 0.1–0.9)
MONO%: 7.3 % (ref 0.0–14.0)
NEUT#: 3.7 10*3/uL (ref 1.5–6.5)
NEUT%: 62.1 % (ref 39.0–75.0)
Platelets: 141 10*3/uL (ref 140–400)
RBC: 5.37 10*6/uL (ref 4.20–5.82)
RDW: 13.6 % (ref 11.0–14.6)
Retic %: 1.23 % (ref 0.80–1.80)
Retic Ct Abs: 66.05 10*3/uL (ref 34.80–93.90)
WBC: 6 10*3/uL (ref 4.0–10.3)
lymph#: 1.5 10*3/uL (ref 0.9–3.3)
nRBC: 0 % (ref 0–0)

## 2016-12-09 LAB — MORPHOLOGY: PLT EST: DECREASED

## 2016-12-09 NOTE — Telephone Encounter (Signed)
Arthur Moore from [facility] states pt is there requesting ua c&s for Arthur Moore cloudy urine and sx of burning on urination. S/w Dr Irene Limbo and he said to order UA under Dr Lebron Conners so Dr Lebron Conners gets the results. Done. Verbal order given over phone.

## 2016-12-09 NOTE — Telephone Encounter (Signed)
Pt had labs ordered by Dr. Lebron Conners drawn at [facility] on 8/10 per pt request.  This RN called [facility] to request labs faxed to [facility].  Fax number provided.  [facility] account [id number]

## 2016-12-10 ENCOUNTER — Telehealth: Payer: Self-pay

## 2016-12-10 NOTE — Telephone Encounter (Signed)
Pt to f/u with PCP regarding liver function tests. No concern at this time from Dr. Lebron Conners or need for f/u regarding hematoloy. Record shows Leighton Ruff, MD is pt's PCP. Gave pt number to [place] if different PCP so that we may relay need for liver function review to the new PCP.

## 2016-12-11 ENCOUNTER — Telehealth: Payer: Self-pay

## 2016-12-11 NOTE — Telephone Encounter (Signed)
Left message with secretary for MD upon her return from vacation. Dr. Lebron Conners is not concerned about thrombocytopenia at this time and does not believe pt to require f/u with [place]. Would like for PCP to follow r/t liver functions. Pt has been informed of findings and communication to be given to Dr. Drema Dallas at [facility].

## 2016-12-11 NOTE — Telephone Encounter (Signed)
Attempt to speak with Dr. Lebron Conners. Pt has called back twice since Dr. Clydene Laming attempt to call him. Will remind MD to call in the morning if he has not been able to reach pt.

## 2016-12-11 NOTE — Telephone Encounter (Signed)
Relayed the same information with pt regarding platelets and liver function tests. Expecting more of an explanation from Dr. Lebron Conners. Told pt I would discuss with Dr. Lebron Conners and our office would give him a call back.

## 2016-12-12 ENCOUNTER — Other Ambulatory Visit: Payer: Self-pay | Admitting: Hematology and Oncology

## 2016-12-12 ENCOUNTER — Telehealth: Payer: Self-pay

## 2016-12-12 DIAGNOSIS — R7401 Elevation of levels of liver transaminase levels: Secondary | ICD-10-CM

## 2016-12-12 DIAGNOSIS — R740 Nonspecific elevation of levels of transaminase and lactic acid dehydrogenase [LDH]: Principal | ICD-10-CM

## 2016-12-12 DIAGNOSIS — D696 Thrombocytopenia, unspecified: Secondary | ICD-10-CM

## 2016-12-12 NOTE — Telephone Encounter (Signed)
Pt and Dr. Lebron Conners have been back and forth on the phone attempting to discuss the lab work. Difficulty reaching [facility] to retrieve results. Found results in file for papers to be scanned. Dr. Lebron Conners able to discuss with pt over the phone.

## 2016-12-16 ENCOUNTER — Ambulatory Visit (INDEPENDENT_AMBULATORY_CARE_PROVIDER_SITE_OTHER): Payer: 59 | Admitting: Allergy and Immunology

## 2016-12-16 ENCOUNTER — Encounter: Payer: Self-pay | Admitting: Allergy and Immunology

## 2016-12-16 VITALS — BP 110/72 | HR 70 | Resp 18

## 2016-12-16 DIAGNOSIS — K219 Gastro-esophageal reflux disease without esophagitis: Secondary | ICD-10-CM

## 2016-12-16 DIAGNOSIS — J3089 Other allergic rhinitis: Secondary | ICD-10-CM | POA: Diagnosis not present

## 2016-12-16 DIAGNOSIS — J4540 Moderate persistent asthma, uncomplicated: Secondary | ICD-10-CM

## 2016-12-16 NOTE — Progress Notes (Signed)
Follow-up Note  Referring Provider: Leighton Ruff, MD Primary Provider: Leighton Ruff, MD Date of Office Visit: 12/16/2016  Subjective:   Arthur Moore (DOB: Nov 07, 1950) is a 66 y.o. male who returns to the [facility] on 12/16/2016 in re-evaluation of the following:  HPI: Arthur Moore returns to this clinic in reevaluation of his asthma and allergic rhinoconjunctivitis and LPR. His last visit to this clinic was February 2018.  He is using a course of immunotherapy and has done quite well while utilizing this form of treatment. He has not had any adverse effects secondary to the administration of this treatment. He has been able to go through each season of the year with no problem at all at this point.   He continues to use his Symbicort on a regular basis and his nasal flunisolide on a regular basis and has had very little problems with both his upper or lower airways. He does not require a systemic steroid or an antibiotic to treat any type of respiratory tract issue and he can exercise without any difficulty and does not use a short acting bronchodilator.  His reflux has been under excellent control as has the issue with his throat.  Allergies as of 12/16/2016      Reactions   Atorvastatin Other (See Comments)   Calf pain.      Medication List      albuterol 108 (90 Base) MCG/ACT inhaler Commonly known as:  PROVENTIL HFA;VENTOLIN HFA Inhale 1 puff into the lungs as needed for wheezing or shortness of breath.   aspirin EC 81 MG tablet Take 81 mg by mouth daily.   budesonide-formoterol 160-4.5 MCG/ACT inhaler Commonly known as:  SYMBICORT Inhale 2 puffs into the lungs 2 (two) times daily.   cetirizine 10 MG tablet Commonly known as:  ZYRTEC Take 10 mg by mouth daily as needed for allergies.   metoprolol tartrate 25 MG tablet Commonly known as:  LOPRESSOR Take 25 mg by mouth daily.   pantoprazole 40 MG tablet Commonly known as:  PROTONIX     rosuvastatin 10 MG tablet Commonly known as:  CRESTOR       Past Medical History:  Diagnosis Date  . Aortic insufficiency   . Arthritis   . Asthma   . Cervical disc disease   . Depression   . Diastolic dysfunction 08/04/1446  . Hyperlipidemia 08/24/2015  . Hypertension   . Lumbar disc disease   . Psoriasis     Past Surgical History:  Procedure Laterality Date  . CHOLECYSTECTOMY  2012  . HERNIA REPAIR    . PAROTIDECTOMY Right 1992  . PROSTATE SURGERY  2002, 2004  . SINOSCOPY    . SINUS EXPLORATION  2013  . SPINE SURGERY  2013   L C7-T1 laminectomy and discectomy  . TONSILLECTOMY      Review of systems negative except as noted in HPI / PMHx or noted below:  Review of Systems  Constitutional: Negative.   HENT: Negative.   Eyes: Negative.   Respiratory: Negative.   Cardiovascular: Negative.   Gastrointestinal: Negative.   Genitourinary: Negative.   Musculoskeletal: Negative.   Skin: Negative.   Neurological: Negative.   Endo/Heme/Allergies: Negative.   Psychiatric/Behavioral: Negative.      Objective:   Vitals:   12/16/16 1336  BP: 110/72  Pulse: 70  Resp: 18  SpO2: 92%          Physical Exam  Constitutional: He is well-developed, well-nourished, and in no distress.  HENT:  Head: Normocephalic.  Right Ear: Tympanic membrane, external ear and ear canal normal.  Left Ear: Tympanic membrane, external ear and ear canal normal.  Nose: Nose normal. No mucosal edema or rhinorrhea.  Mouth/Throat: Uvula is midline, oropharynx is clear and moist and mucous membranes are normal. No oropharyngeal exudate.  Eyes: Conjunctivae are normal.  Neck: Trachea normal. No tracheal tenderness present. No tracheal deviation present. No thyromegaly present.  Cardiovascular: Normal rate, regular rhythm, S1 normal, S2 normal and normal heart sounds.   No murmur heard. Pulmonary/Chest: Breath sounds normal. No stridor. No respiratory distress. He has no wheezes. He has no  rales.  Musculoskeletal: He exhibits no edema.  Lymphadenopathy:       Head (right side): No tonsillar adenopathy present.       Head (left side): No tonsillar adenopathy present.    He has no cervical adenopathy.  Neurological: He is alert. Gait normal.  Skin: Rash (Slightly scaly erythematous dermatitis involving nasal bridge and nasal folds.) noted. He is not diaphoretic. No erythema. Nails show no clubbing.  Psychiatric: Mood and affect normal.    Diagnostics: none  Assessment and Plan:   1. Asthma, moderate persistent, well-controlled   2. Other allergic rhinitis   3. LPRD (laryngopharyngeal reflux disease)     1. Continue immunotherapy and EpiPen  2. Continue Symbicort 160 - 2 inhalations twice a day  3. Continue nasal flunisolide 1-2 sprays each nostril one time per day during upper airway symptoms  4. Continue Protonix 40 mg one time per day  5. Continue Zyrtec and albuterol HFA if needed  6. Obtain fall flu vaccine    7. Return to clinic in 6 months or earlier if problem.  Arthur Moore has really done very well and he will continue to use immunotherapy and anti-inflammatory medications for his respiratory tract and therapy directed against reflux to address his multiorgan atopic disease and reflux-induced respiratory disease. I will see him back in this clinic in 6 months or earlier if there is a problem.  Allena Katz, MD Allergy / Immunology [place]

## 2016-12-16 NOTE — Patient Instructions (Addendum)
  1. Continue immunotherapy and EpiPen  2. Continue Symbicort 160 - 2 inhalations twice a day  3. Continue nasal flunisolide 1-2 sprays each nostril one time per day during upper airway symptoms  4. Continue Protonix 40 mg one time per day  5. Continue Zyrtec and albuterol HFA if needed  6. Obtain fall flu vaccine    7. Return to clinic in 6 months or earlier if problem.

## 2016-12-18 ENCOUNTER — Other Ambulatory Visit (HOSPITAL_COMMUNITY): Payer: Self-pay | Admitting: Gastroenterology

## 2016-12-18 DIAGNOSIS — R945 Abnormal results of liver function studies: Principal | ICD-10-CM

## 2016-12-18 DIAGNOSIS — R7989 Other specified abnormal findings of blood chemistry: Secondary | ICD-10-CM

## 2016-12-25 ENCOUNTER — Ambulatory Visit (INDEPENDENT_AMBULATORY_CARE_PROVIDER_SITE_OTHER): Payer: 59 | Admitting: *Deleted

## 2016-12-25 DIAGNOSIS — J309 Allergic rhinitis, unspecified: Secondary | ICD-10-CM | POA: Diagnosis not present

## 2016-12-30 ENCOUNTER — Ambulatory Visit (HOSPITAL_COMMUNITY)
Admission: RE | Admit: 2016-12-30 | Discharge: 2016-12-30 | Disposition: A | Discharge status: Home / Self Care | Payer: POS | Source: Ambulatory Visit | Attending: Gastroenterology | Admitting: Gastroenterology

## 2016-12-30 DIAGNOSIS — R7989 Other specified abnormal findings of blood chemistry: Secondary | ICD-10-CM | POA: Diagnosis not present

## 2016-12-30 DIAGNOSIS — N281 Cyst of kidney, acquired: Secondary | ICD-10-CM | POA: Diagnosis not present

## 2016-12-30 DIAGNOSIS — R945 Abnormal results of liver function studies: Secondary | ICD-10-CM

## 2017-01-02 ENCOUNTER — Emergency Department (HOSPITAL_COMMUNITY): Payer: POS

## 2017-01-02 ENCOUNTER — Encounter (HOSPITAL_COMMUNITY): Payer: Self-pay | Admitting: Emergency Medicine

## 2017-01-02 ENCOUNTER — Emergency Department (HOSPITAL_COMMUNITY)
Admission: EM | Admit: 2017-01-02 | Discharge: 2017-01-02 | Disposition: A | Discharge status: Home / Self Care | Payer: POS | Attending: Emergency Medicine | Admitting: Emergency Medicine

## 2017-01-02 DIAGNOSIS — J45909 Unspecified asthma, uncomplicated: Secondary | ICD-10-CM | POA: Insufficient documentation

## 2017-01-02 DIAGNOSIS — M25541 Pain in joints of right hand: Secondary | ICD-10-CM | POA: Diagnosis not present

## 2017-01-02 DIAGNOSIS — M79641 Pain in right hand: Secondary | ICD-10-CM

## 2017-01-02 DIAGNOSIS — Z7982 Long term (current) use of aspirin: Secondary | ICD-10-CM | POA: Insufficient documentation

## 2017-01-02 DIAGNOSIS — Z79899 Other long term (current) drug therapy: Secondary | ICD-10-CM | POA: Diagnosis not present

## 2017-01-02 DIAGNOSIS — I10 Essential (primary) hypertension: Secondary | ICD-10-CM | POA: Diagnosis not present

## 2017-01-02 NOTE — ED Provider Notes (Signed)
[facility] DEPT Provider Note   CSN: [id number] Arrival date & time: 01/02/17  7106     History   Chief Complaint Chief Complaint  Patient presents with  . Hand Pain    HPI Arthur Moore is a 66 y.o. male.  The history is provided by the patient.  Hand Pain  This is a new problem. The current episode started more than 2 days ago. The problem occurs constantly. The problem has not changed since onset.Pertinent negatives include no chest pain, no abdominal pain and no shortness of breath. Exacerbated by: bending the 3rd through 5th digits of the right hand. Nothing relieves the symptoms. He has tried acetaminophen for the symptoms. The treatment provided no relief.   66 year old male with presents with right hand pain for 2-3 days. History of aortic insufficiency, psoriatic arthritis, degenerative disc disease. No trauma or fall. No exertional activity or lifting associated. Tried tylenol and ibuprofen without relief. Pain localized to the 3rd through 4th digits, worse with ROM of the digits. Not associated with joint swelling. Pain sharp, denies burning pain. No numbness or weakness.  Past Medical History:  Diagnosis Date  . Aortic insufficiency   . Arthritis   . Asthma   . Cervical disc disease   . Depression   . Diastolic dysfunction 2/69/4854  . Hyperlipidemia 08/24/2015  . Hypertension   . Lumbar disc disease   . Psoriasis     Patient Active Problem List   Diagnosis Date Noted  . Thrombocytopenia ([place]) 12/01/2016  . Transaminitis 12/01/2016  . Diastolic dysfunction 62/70/3500  . MDD (major depressive disorder), recurrent severe, without psychosis ([place]) 10/13/2015  . Hyperlipidemia 08/24/2015  . Arthritis   . Depression   . Hypertension   . Aortic insufficiency   . Psoriasis   . Cervical disc disease   . Lumbar disc disease   . Asthma 01/06/2015  . Psoriatic arthritis ([place]) 01/06/2015  . Allergic rhinoconjunctivitis 01/06/2015  . GERD (gastroesophageal  reflux disease) 01/06/2015  . Laryngopharyngeal reflux (LPR) 01/06/2015    Past Surgical History:  Procedure Laterality Date  . CHOLECYSTECTOMY  2012  . HERNIA REPAIR    . PAROTIDECTOMY Right 1992  . PROSTATE SURGERY  2002, 2004  . SINOSCOPY    . SINUS EXPLORATION  2013  . SPINE SURGERY  2013   L C7-T1 laminectomy and discectomy  . TONSILLECTOMY         Home Medications    Prior to Admission medications   Medication Sig Start Date End Date Taking? Authorizing Provider  albuterol (PROVENTIL HFA;VENTOLIN HFA) 108 (90 Base) MCG/ACT inhaler Inhale 1 puff into the lungs as needed for wheezing or shortness of breath.    [provider]  aspirin EC 81 MG tablet Take 81 mg by mouth daily.    [provider]  budesonide-formoterol (SYMBICORT) 160-4.5 MCG/ACT inhaler Inhale 2 puffs into the lungs 2 (two) times daily.    [provider]  cetirizine (ZYRTEC) 10 MG tablet Take 10 mg by mouth daily as needed for allergies.    [provider]  lithium carbonate 300 MG capsule Take 300 mg by mouth 3 (three) times daily. 12/25/16   [provider]  metoprolol tartrate (LOPRESSOR) 25 MG tablet Take 25 mg by mouth daily.    [provider]  pantoprazole (PROTONIX) 40 MG tablet  06/02/16   [provider]  rosuvastatin (CRESTOR) 10 MG tablet  10/27/16   [provider]    Family History Family  History  Problem Relation Age of Onset  . Colon cancer Mother   . Lung cancer Father     Social History Social History  Substance Use Topics  . Smoking status: Never Smoker  . Smokeless tobacco: Never Used  . Alcohol use No     Allergies   Atorvastatin   Review of Systems Review of Systems  Constitutional: Negative for fever.  Respiratory: Negative for shortness of breath.   Cardiovascular: Negative for chest pain.  Gastrointestinal: Negative for abdominal pain.  Musculoskeletal: Positive for arthralgias (hand pain).    Skin: Negative for wound.  Neurological: Negative for weakness and numbness.     Physical Exam Updated Vital Signs BP 112/79 (BP Location: Left Arm)   Pulse 70   Temp 98.2 F (36.8 C) (Oral)   Resp 18   SpO2 97%   Physical Exam Physical Exam  Constitutional: Appears well-developed and well-nourished. No acute distress. HENT:  Head: Normocephalic.  Eyes: Conjunctivae are normal.  Cardiovascular: Normal rate and intact distal pulses.  +2 radial pulses bilaterally Pulmonary/Chest: Effort normal. No respiratory distress.  Abdominal: Exhibits no distension.  Musculoskeletal: Exhibits no deformity. 3rd through 5th digits of right hand stiff with limited ROM. No soft tissue swelling or joint swelling Neurological: Alert. Fluent speech. In tact innervation of the radial, ulnar and median nerves of the right hand Skin: Skin is warm and dry.  Psychiatric: Normal mood and affect. Behavior is normal.  Nursing note and vitals reviewed.   ED Treatments / Results  Labs (all labs ordered are listed, but only abnormal results are displayed) Labs Reviewed - No data to display  EKG  EKG Interpretation None       Radiology No results found.  Procedures Procedures (including critical care time)  Medications Ordered in ED Medications - No data to display   Initial Impression / Assessment and Plan / ED Course  I have reviewed the triage vital signs and the nursing notes.  Pertinent labs & imaging results that were available during my care of the patient were reviewed by me and considered in my medical decision making (see chart for details).     66 year old man who presents with left hand pain. Extremity is well perfused, neurologically intact. Although initial triage note states that he has burning pain of the right hand, he denies this and states that pain is primarily sharp, musculoskeletal, and localized in the third to fifth digit of the right hand. No signs of overlying  skin changes or joint abnormalities. He does have a history of arthritis,and suspect that this may be causing some of his symptoms. Discussed x-ray of the hand. I discussed initial supportive care instructions with ibuprofen or Tylenol here while we do x-ray imaging.  Patient is subsequently documented by his nurse to be very angry that he was not receiving any stronger pain medications. The patient eloped from the ED subsequently. When I tried to locate him to talk with him, he had already left the ED.  Final Clinical Impressions(s) / ED Diagnoses   Final diagnoses:  Right hand pain    New Prescriptions New Prescriptions   No medications on file     Forde Dandy, MD 01/02/17 [phone]

## 2017-01-02 NOTE — ED Notes (Signed)
Patient was seen leaving. When stopped to ask were he was going he stated "I am leaving this fucking place. I can't get any narcotic pain medicines unless I am with hospice and I am not dying so I guess I will go home. You all better not charge me for that damn blood pressure cuff either". When asking patient to come to room and let's go over his concerns he refused and stated "I am leaving we don't need to talk. I know you just got here because I haven't seen you but it's ok. I don't want to pay for x-rays or ibuprofen. I can see my doctor for that" Advised patient to allow me to go get the provider to discuss his plan and he refused to see the provider and stated "have a good day and good bye." Advised again that once the provider came in maybe he could be evaluated for his need. Patient continued to refuse and walked off. He did come to desk to sign AMA.

## 2017-01-02 NOTE — ED Triage Notes (Signed)
Pt states his right hand feels like it is on fire  Pt states it started about 2.5-3 days ago  Denies injury

## 2017-01-05 ENCOUNTER — Ambulatory Visit (INDEPENDENT_AMBULATORY_CARE_PROVIDER_SITE_OTHER): Payer: 59

## 2017-01-05 DIAGNOSIS — J309 Allergic rhinitis, unspecified: Secondary | ICD-10-CM

## 2017-01-22 ENCOUNTER — Other Ambulatory Visit: Payer: Self-pay | Admitting: Cardiovascular Disease

## 2017-01-22 NOTE — Telephone Encounter (Signed)
REFILL 

## 2017-01-22 NOTE — Telephone Encounter (Signed)
Please review for refill, thanks ! 

## 2017-01-30 ENCOUNTER — Ambulatory Visit (INDEPENDENT_AMBULATORY_CARE_PROVIDER_SITE_OTHER): Payer: 59

## 2017-01-30 DIAGNOSIS — J309 Allergic rhinitis, unspecified: Secondary | ICD-10-CM

## 2017-02-03 ENCOUNTER — Ambulatory Visit (INDEPENDENT_AMBULATORY_CARE_PROVIDER_SITE_OTHER): Payer: 59

## 2017-02-03 DIAGNOSIS — J309 Allergic rhinitis, unspecified: Secondary | ICD-10-CM

## 2017-02-10 ENCOUNTER — Ambulatory Visit (INDEPENDENT_AMBULATORY_CARE_PROVIDER_SITE_OTHER): Payer: 59 | Admitting: *Deleted

## 2017-02-10 DIAGNOSIS — J309 Allergic rhinitis, unspecified: Secondary | ICD-10-CM

## 2017-02-16 ENCOUNTER — Ambulatory Visit (INDEPENDENT_AMBULATORY_CARE_PROVIDER_SITE_OTHER): Payer: 59

## 2017-02-16 DIAGNOSIS — J309 Allergic rhinitis, unspecified: Secondary | ICD-10-CM | POA: Diagnosis not present

## 2017-02-26 ENCOUNTER — Ambulatory Visit (INDEPENDENT_AMBULATORY_CARE_PROVIDER_SITE_OTHER): Payer: 59

## 2017-02-26 DIAGNOSIS — J309 Allergic rhinitis, unspecified: Secondary | ICD-10-CM | POA: Diagnosis not present

## 2017-03-06 ENCOUNTER — Telehealth: Payer: Self-pay

## 2017-03-06 NOTE — Telephone Encounter (Signed)
Left message for patient to return call to discuss.

## 2017-03-06 NOTE — Telephone Encounter (Signed)
Patient called back and I informed him of Dr. Bruna Potter advise. He also stated that he got his last weekly injection last week and now is on every two weeks and since he didn't get an injection this week he has been having runny nose and a post nasal drip. He stated that he is taking his allergy medication and is still having these symptoms, which is why he was asking if it would benefit him to come weekly instead one every two weeks. He also had an additional question wondering if he would be able to get off the allergy shots?

## 2017-03-06 NOTE — Telephone Encounter (Signed)
Pt was wondering if it was okay for him to come weekly as on the by weeks he is having some issues with his sinus (runny nose) Please advise   Can call pt back at this number [phone]

## 2017-03-06 NOTE — Telephone Encounter (Signed)
OK to have ITX weekly

## 2017-03-09 NOTE — Telephone Encounter (Signed)
Please inform patient that he can use every week and it is usually advisable to use ITX for 3-5 years to receive permament benifit

## 2017-03-09 NOTE — Telephone Encounter (Signed)
Please advise 

## 2017-03-09 NOTE — Telephone Encounter (Signed)
Left message for patient advising him that he can call either [place] or [place] office.

## 2017-03-11 NOTE — Telephone Encounter (Signed)
Called patient again at number listed. No answer.

## 2017-03-12 NOTE — Progress Notes (Signed)
VIALS MADE EXP. 03/13/18

## 2017-03-12 NOTE — Telephone Encounter (Signed)
Noted in immunotherapy tab 

## 2017-03-13 DIAGNOSIS — J3089 Other allergic rhinitis: Secondary | ICD-10-CM | POA: Diagnosis not present

## 2017-03-18 ENCOUNTER — Ambulatory Visit (INDEPENDENT_AMBULATORY_CARE_PROVIDER_SITE_OTHER): Payer: 59

## 2017-03-18 DIAGNOSIS — J309 Allergic rhinitis, unspecified: Secondary | ICD-10-CM

## 2017-03-30 ENCOUNTER — Ambulatory Visit (INDEPENDENT_AMBULATORY_CARE_PROVIDER_SITE_OTHER): Payer: 59 | Admitting: *Deleted

## 2017-03-30 DIAGNOSIS — J309 Allergic rhinitis, unspecified: Secondary | ICD-10-CM | POA: Diagnosis not present

## 2017-04-07 ENCOUNTER — Other Ambulatory Visit: Payer: Self-pay | Admitting: Gastroenterology

## 2017-04-07 DIAGNOSIS — K869 Disease of pancreas, unspecified: Secondary | ICD-10-CM

## 2017-04-10 ENCOUNTER — Ambulatory Visit
Admission: RE | Admit: 2017-04-10 | Discharge: 2017-04-10 | Disposition: A | Discharge status: Home / Self Care | Payer: POS | Source: Ambulatory Visit | Attending: Gastroenterology | Admitting: Gastroenterology

## 2017-04-10 DIAGNOSIS — K869 Disease of pancreas, unspecified: Secondary | ICD-10-CM

## 2017-04-10 MED ORDER — GADOBENATE DIMEGLUMINE 529 MG/ML IV SOLN
18.0000 mL | Freq: Once | INTRAVENOUS | Status: AC | PRN
  Administered 2017-04-10: 18 mL via INTRAVENOUS

## 2017-04-15 ENCOUNTER — Ambulatory Visit (INDEPENDENT_AMBULATORY_CARE_PROVIDER_SITE_OTHER): Payer: 59 | Admitting: Family Medicine

## 2017-04-15 DIAGNOSIS — J309 Allergic rhinitis, unspecified: Secondary | ICD-10-CM

## 2017-04-18 ENCOUNTER — Other Ambulatory Visit: Payer: Self-pay

## 2017-04-29 ENCOUNTER — Ambulatory Visit (INDEPENDENT_AMBULATORY_CARE_PROVIDER_SITE_OTHER): Payer: 59 | Admitting: *Deleted

## 2017-04-29 DIAGNOSIS — J309 Allergic rhinitis, unspecified: Secondary | ICD-10-CM

## 2017-05-07 ENCOUNTER — Telehealth: Payer: Self-pay

## 2017-05-07 NOTE — Telephone Encounter (Signed)
   [facility] Pre-operative Risk Assessment    Request for surgical clearance:    [place]  1. What type of surgery is being performed? LEFT SHOULDER:SAD,DCR, SA-DCR   2. When is this surgery scheduled? TBD   3. Are there any medications that need to be held prior to surgery and how long? PLEASE ADVISE   4. Practice name and name of physician performing surgery? [facility]    5. What is your office phone and fax number? [phone] FX [phone]   6. Anesthesia type (None, local, MAC, general) ? NONE LISTED   Waylan Rocher 05/07/2017, 4:32 PM  _________________________________________________________________   (provider comments below)

## 2017-05-09 ENCOUNTER — Other Ambulatory Visit: Payer: Self-pay

## 2017-05-09 ENCOUNTER — Emergency Department (HOSPITAL_COMMUNITY): Payer: POS

## 2017-05-09 ENCOUNTER — Emergency Department (HOSPITAL_COMMUNITY)
Admission: EM | Admit: 2017-05-09 | Discharge: 2017-05-09 | Disposition: A | Discharge status: Home / Self Care | Payer: POS | Attending: Emergency Medicine | Admitting: Emergency Medicine

## 2017-05-09 ENCOUNTER — Encounter (HOSPITAL_COMMUNITY): Payer: Self-pay

## 2017-05-09 DIAGNOSIS — Z79899 Other long term (current) drug therapy: Secondary | ICD-10-CM | POA: Insufficient documentation

## 2017-05-09 DIAGNOSIS — R0789 Other chest pain: Secondary | ICD-10-CM | POA: Insufficient documentation

## 2017-05-09 DIAGNOSIS — J45909 Unspecified asthma, uncomplicated: Secondary | ICD-10-CM | POA: Insufficient documentation

## 2017-05-09 DIAGNOSIS — Z7982 Long term (current) use of aspirin: Secondary | ICD-10-CM | POA: Diagnosis not present

## 2017-05-09 DIAGNOSIS — R1013 Epigastric pain: Secondary | ICD-10-CM | POA: Insufficient documentation

## 2017-05-09 DIAGNOSIS — I10 Essential (primary) hypertension: Secondary | ICD-10-CM | POA: Diagnosis not present

## 2017-05-09 LAB — CBC
HCT: 40.9 % (ref 39.0–52.0)
Hemoglobin: 14.4 g/dL (ref 13.0–17.0)
MCH: 31.3 pg (ref 26.0–34.0)
MCHC: 35.2 g/dL (ref 30.0–36.0)
MCV: 88.9 fL (ref 78.0–100.0)
Platelets: 95 10*3/uL — ABNORMAL LOW (ref 150–400)
RBC: 4.6 MIL/uL (ref 4.22–5.81)
RDW: 12.9 % (ref 11.5–15.5)
WBC: 5.6 10*3/uL (ref 4.0–10.5)

## 2017-05-09 LAB — I-STAT TROPONIN, ED
Troponin i, poc: 0.01 ng/mL (ref 0.00–0.08)
Troponin i, poc: 0 ng/mL (ref 0.00–0.08)

## 2017-05-09 LAB — D-DIMER, QUANTITATIVE
D-Dimer, Quant: 0.34 ug/mL-FEU (ref 0.00–0.50)
D-Dimer, Quant: 0.34 ug/mL-FEU (ref 0.00–0.50)

## 2017-05-09 LAB — BASIC METABOLIC PANEL
Anion gap: 10 (ref 5–15)
BUN: 18 mg/dL (ref 6–20)
CO2: 21 mmol/L — ABNORMAL LOW (ref 22–32)
Calcium: 8.9 mg/dL (ref 8.9–10.3)
Chloride: 109 mmol/L (ref 101–111)
Creatinine, Ser: 1.18 mg/dL (ref 0.61–1.24)
GFR calc Af Amer: >60 mL/min (ref >60)
GFR calc non Af Amer: >60 mL/min (ref >60)
Glucose, Bld: 151 mg/dL — ABNORMAL HIGH (ref 65–99)
Potassium: 4.4 mmol/L (ref 3.5–5.1)
Sodium: 140 mmol/L (ref 135–145)

## 2017-05-09 MED ORDER — MORPHINE SULFATE (PF) 4 MG/ML IV SOLN
4.0000 mg | Freq: Once | INTRAVENOUS | Status: AC
  Administered 2017-05-09: 4 mg via INTRAVENOUS
  Filled 2017-05-09: qty 1

## 2017-05-09 MED ORDER — IOPAMIDOL (ISOVUE-370) INJECTION 76%
INTRAVENOUS | Status: AC
  Administered 2017-05-09: 100 mL
  Filled 2017-05-09: qty 100

## 2017-05-09 NOTE — ED Notes (Signed)
Pt departed in NAD.  

## 2017-05-09 NOTE — ED Notes (Signed)
MD at bedside, pt requesting more morphine.

## 2017-05-09 NOTE — ED Notes (Signed)
Label sent to main lab for d-dimer blood test.

## 2017-05-09 NOTE — ED Provider Notes (Signed)
[facility] EMERGENCY DEPARTMENT Provider Note   CSN: [id number] Arrival date & time: 05/09/17  0209     History   Chief Complaint Chief Complaint  Patient presents with  . Chest Pain    HPI Arthur Moore is a 67 y.o. male.  This patient is a 67 year old male with past medical history of aortic insufficiency, degenerative disc disease, and hypertension.  He presents today for evaluation of palpitations and chest discomfort.  He states that approximately 1115 this evening he was getting ready for bed and experienced racing heart and tightness to his right shoulder.  He denies any shortness of breath or nausea.  This episode lasted for approximately 2 hours.  He reports elevated blood pressure and elevated heart rate during this time.  He called 911 and was transported here for further evaluation.  He is now feeling better.   The history is provided by the patient.  Chest Pain   This is a new problem. The current episode started 1 to 2 hours ago. The problem occurs constantly. The problem has been resolved. Pain location: Right shoulder. The pain is moderate. The pain does not radiate. Associated symptoms include palpitations. Pertinent negatives include no diaphoresis.    Past Medical History:  Diagnosis Date  . Aortic insufficiency   . Arthritis   . Asthma   . Cervical disc disease   . Depression   . Diastolic dysfunction 4/81/8563  . Hyperlipidemia 08/24/2015  . Hypertension   . Lumbar disc disease   . Psoriasis     Patient Active Problem List   Diagnosis Date Noted  . Thrombocytopenia ([place]) 12/01/2016  . Transaminitis 12/01/2016  . Diastolic dysfunction 14/97/0263  . MDD (major depressive disorder), recurrent severe, without psychosis ([place]) 10/13/2015  . Hyperlipidemia 08/24/2015  . Arthritis   . Depression   . Hypertension   . Aortic insufficiency   . Psoriasis   . Cervical disc disease   . Lumbar disc disease   . Asthma 01/06/2015  .  Psoriatic arthritis ([place]) 01/06/2015  . Allergic rhinoconjunctivitis 01/06/2015  . GERD (gastroesophageal reflux disease) 01/06/2015  . Laryngopharyngeal reflux (LPR) 01/06/2015    Past Surgical History:  Procedure Laterality Date  . CHOLECYSTECTOMY  2012  . HERNIA REPAIR    . PAROTIDECTOMY Right 1992  . PROSTATE SURGERY  2002, 2004  . SINOSCOPY    . SINUS EXPLORATION  2013  . SPINE SURGERY  2013   L C7-T1 laminectomy and discectomy  . TONSILLECTOMY         Home Medications    Prior to Admission medications   Medication Sig Start Date End Date Taking? Authorizing Provider  albuterol (PROVENTIL HFA;VENTOLIN HFA) 108 (90 Base) MCG/ACT inhaler Inhale 1 puff into the lungs as needed for wheezing or shortness of breath.   Yes [provider]  aspirin EC 81 MG tablet Take 81 mg by mouth daily.   Yes [provider]  budesonide-formoterol (SYMBICORT) 160-4.5 MCG/ACT inhaler Inhale 2 puffs into the lungs 2 (two) times daily.   Yes [provider]  cetirizine (ZYRTEC) 10 MG tablet Take 10 mg by mouth daily as needed for allergies.   Yes [provider]  metoprolol tartrate (LOPRESSOR) 25 MG tablet Take 25 mg by mouth daily.   Yes [provider]  pantoprazole (PROTONIX) 40 MG tablet  06/02/16  Yes [provider]  rosuvastatin (CRESTOR) 10 MG tablet TAKE 1 TABLET DAILY 01/22/17  Yes Skeet Latch, MD  lithium carbonate 300  MG capsule Take 300 mg by mouth 3 (three) times daily. 12/25/16   [provider]    Family History Family History  Problem Relation Age of Onset  . Colon cancer Mother   . Lung cancer Father     Social History Social History   Tobacco Use  . Smoking status: Never Smoker  . Smokeless tobacco: Never Used  Substance Use Topics  . Alcohol use: No    Alcohol/week: 0.0 oz  . Drug use: No     Allergies   Atorvastatin   Review of Systems Review of Systems  Constitutional: Negative for  diaphoresis.  Cardiovascular: Positive for chest pain and palpitations.  All other systems reviewed and are negative.    Physical Exam Updated Vital Signs BP 125/79   Pulse 78   Temp 98.8 F (37.1 C) (Oral)   Resp 12   Ht 6' (1.829 m)   Wt 88.5 kg (195 lb)   SpO2 94%   BMI 26.45 kg/m   Physical Exam  Constitutional: He is oriented to person, place, and time. He appears well-developed and well-nourished. No distress.  HENT:  Head: Normocephalic and atraumatic.  Mouth/Throat: Oropharynx is clear and moist.  Neck: Normal range of motion. Neck supple.  Cardiovascular: Normal rate and regular rhythm. Exam reveals no friction rub.  No murmur heard. Pulmonary/Chest: Effort normal and breath sounds normal. No respiratory distress. He has no wheezes. He has no rales.  Abdominal: Soft. Bowel sounds are normal. He exhibits no distension. There is no tenderness.  Musculoskeletal: Normal range of motion. He exhibits no edema.  Neurological: He is alert and oriented to person, place, and time. Coordination normal.  Skin: Skin is warm and dry. He is not diaphoretic.  Nursing note and vitals reviewed.    ED Treatments / Results  Labs (all labs ordered are listed, but only abnormal results are displayed) Labs Reviewed  BASIC METABOLIC PANEL  CBC  I-STAT TROPONIN, ED    EKG  EKG Interpretation  Date/Time:  Saturday May 09 2017 02:10:41 EST Ventricular Rate:  80 PR Interval:    QRS Duration: 101 QT Interval:  403 QTC Calculation: 465 R Axis:   -4 Text Interpretation:  Sinus rhythm Normal ECG Confirmed by Veryl Speak [phone]) on 05/09/2017 2:25:15 AM       Radiology No results found.  Procedures Procedures (including critical care time)  Medications Ordered in ED Medications - No data to display   Initial Impression / Assessment and Plan / ED Course  I have reviewed the triage vital signs and the nursing notes.  Pertinent labs & imaging results that were  available during my care of the patient were reviewed by me and considered in my medical decision making (see chart for details).  Patient presents here with complaints of pain in his right shoulder, elevated blood pressure, and palpitations that started earlier this evening.  He also has pain in his upper abdomen.  His workup is unremarkable including EKG, troponin x2, d-dimer, and CT scans of the chest, abdomen, and pelvis.  He has requested and received multiple doses of pain medication and appears to be feeling better.  At this point I see no indication for further workup.  I feel as though I have excluded serious pathology and believe he is appropriate for discharge.  He is to follow-up with his primary doctor.  Final Clinical Impressions(s) / ED Diagnoses   Final diagnoses:  None    ED Discharge Orders  None       Veryl Speak, MD 05/09/17 [phone]

## 2017-05-09 NOTE — ED Triage Notes (Signed)
Patient from home complaining of chest pain that woke him from his sleep. Central chest pain that goes to the right arm. Has bone spurs in left shoulder.  Did nothing to aggravate right arm to cause the pain.  States he has been sedentary for the past few months.  Cardiac hx of mitral valve leak.  EKG unremarkable for EMS.

## 2017-05-09 NOTE — Discharge Instructions (Signed)
Follow-up with your primary doctor this upcoming week if your symptoms do not improve.

## 2017-05-09 NOTE — ED Notes (Signed)
EMS gave 3 nitro and 6 mg morphine to help with the pain, no change to chest pain but decreased BP to WNL  Also 324 ASA given

## 2017-05-11 ENCOUNTER — Ambulatory Visit (INDEPENDENT_AMBULATORY_CARE_PROVIDER_SITE_OTHER): Payer: 59 | Admitting: *Deleted

## 2017-05-11 ENCOUNTER — Encounter: Payer: Self-pay | Admitting: Physician Assistant

## 2017-05-11 ENCOUNTER — Telehealth: Payer: Self-pay | Admitting: Cardiovascular Disease

## 2017-05-11 ENCOUNTER — Ambulatory Visit: Payer: POS | Admitting: Physician Assistant

## 2017-05-11 VITALS — BP 124/82 | HR 61 | Ht 71.5 in | Wt 192.4 lb

## 2017-05-11 DIAGNOSIS — R079 Chest pain, unspecified: Secondary | ICD-10-CM | POA: Diagnosis not present

## 2017-05-11 DIAGNOSIS — Z01818 Encounter for other preprocedural examination: Secondary | ICD-10-CM

## 2017-05-11 DIAGNOSIS — J309 Allergic rhinitis, unspecified: Secondary | ICD-10-CM

## 2017-05-11 DIAGNOSIS — I351 Nonrheumatic aortic (valve) insufficiency: Secondary | ICD-10-CM | POA: Diagnosis not present

## 2017-05-11 DIAGNOSIS — I10 Essential (primary) hypertension: Secondary | ICD-10-CM | POA: Diagnosis not present

## 2017-05-11 MED ORDER — METOPROLOL SUCCINATE ER 25 MG PO TB24: 25.0000 mg | ORAL_TABLET | Freq: Every day | ORAL | 3 refills | Status: DC

## 2017-05-11 MED ORDER — METOPROLOL TARTRATE 25 MG PO TABS: 25.0000 mg | ORAL_TABLET | Freq: Every day | ORAL | 3 refills | Status: DC | PRN

## 2017-05-11 NOTE — Progress Notes (Addendum)
Cardiology Office Note   Date:  05/11/2017   ID:  Arthur Moore, DOB Sep 08, 1950, MRN [id number]  PCP:  Leighton Ruff, MD  Cardiologist: Dr. Oval Linsey, 11/24/2016 [name], PA-C   Chief Complaint  Patient presents with  . Chest Pain    History of Present Illness: Arthur Moore is a 67 y.o. male with a history of TIA, mod AI, HTN, HLD, OSA not on CPAP, depression, D-CHF, lumbar disc dz, PACs and PVCs on monitor, MV 06/2016 w/ EF 49%, small defect felt diaphragmatic attn, 7 METS on Bruce protocol. Hx diverticulitis  1/12 ER visit for chest pain and palpitations.  Pain was in his right shoulder and upper abdomen, ECG, troponin and CT of the chest abdomen and pelvis was unremarkable, patient treated with pain medications and discharged home 01/14 phone notes regarding ER visit and appointment made  Arthur Moore presents for cardiology follow up.  When he was trying to lie down and sleep, he had sudden onset of R shoulder pain. Heart was pounding, nauseated, BP high, abd pain, felt clammy. EMS gave him SL NTG x 4, SBP dropped from > 200 to 140 by arrival at the ER.   He admits that in the last 2-1/2 years, between his depression and his musculoskeletal issues, he has not been very active.  He is feeling better now and is looking forward to increasing his activity.  He has sciatica on the L and L neck pain, also has bone spur in his L shoulder. He sees Dr Nelva Bush on 02/07 to schedule injections. He is scheduled for shoulder surgery 01/25.   He is looking at changing insurance to [facility] in July. The copay would decrease and he may try to delay the shoulder surgery till then.  He is not sure, plans to discuss it with his wife.  His wife was in a significant MVA last year and is still struggling with sequela from that, she is not able to drive.  His depression/mood is tremendously better since being started on Lamictal.   He is frustrated by the physical limitations now  that his mood is so much better.  The financial issues associated with Dr. visits and procedures he needs are also of concern to him.   Past Medical History:  Diagnosis Date  . Aortic insufficiency   . Arthritis   . Asthma   . Cervical disc disease   . Depression   . Diastolic dysfunction 6/71/2458  . Hyperlipidemia 08/24/2015  . Hypertension   . Lumbar disc disease   . Psoriasis     Past Surgical History:  Procedure Laterality Date  . CHOLECYSTECTOMY  2012  . HERNIA REPAIR    . PAROTIDECTOMY Right 1992  . PROSTATE SURGERY  2002, 2004  . SINOSCOPY    . SINUS EXPLORATION  2013  . SPINE SURGERY  2013   L C7-T1 laminectomy and discectomy  . TONSILLECTOMY      Current Outpatient Medications  Medication Sig Dispense Refill  . albuterol (PROVENTIL HFA;VENTOLIN HFA) 108 (90 Base) MCG/ACT inhaler Inhale 1 puff into the lungs as needed for wheezing or shortness of breath.    Marland Kitchen aspirin EC 81 MG tablet Take 81 mg by mouth daily.    . budesonide-formoterol (SYMBICORT) 160-4.5 MCG/ACT inhaler Inhale 2 puffs into the lungs 2 (two) times daily.    . cetirizine (ZYRTEC) 10 MG tablet Take 10 mg by mouth daily as needed for allergies.    Marland Kitchen lamoTRIgine (LAMICTAL) 200 MG tablet Take  200 mg by mouth daily.    . metoprolol tartrate (LOPRESSOR) 25 MG tablet Take 25 mg by mouth daily.    . pantoprazole (PROTONIX) 40 MG tablet Take 40 mg by mouth daily.     . rosuvastatin (CRESTOR) 10 MG tablet TAKE 1 TABLET DAILY 90 tablet 3   No current facility-administered medications for this visit.     Allergies:   Atorvastatin    Social History:  The patient  reports that  has never smoked. he has never used smokeless tobacco. He reports that he does not drink alcohol or use drugs.   Family History:  The patient's family history includes Colon cancer in his mother; Lung cancer in his father.    ROS:  Please see the history of present illness. All other systems are reviewed and negative.     PHYSICAL EXAM: VS:  BP 124/82 (BP Location: Right Arm)   Pulse 61   Ht 5' 11.5" (1.816 m)   Wt 192 lb 6.4 oz (87.3 kg)   BMI 26.46 kg/m  , BMI Body mass index is 26.46 kg/m. GEN: Well nourished, well developed, male in no acute distress  HEENT: normal for age  Neck: no JVD, no carotid bruit, no masses Cardiac: RRR; short diastolic murmur at the left upper mid sternal border, no rubs, or gallops Respiratory:  clear to auscultation bilaterally, normal work of breathing GI: soft, nontender, nondistended, + BS MS: no deformity or atrophy; no edema; distal pulses are 2+ in all 4 extremities   Skin: warm and dry, no rash Neuro:  Strength and sensation are intact Psych: euthymic mood, full affect   EKG:  EKG is ordered today. The ekg ordered today demonstrates sinus rhythm, heart rate 61, no acute ischemic changes, normal intervals  MYOVIEW: 06/26/2016  Nuclear stress EF: 49%. Asynchronous contraction.  The left ventricular ejection fraction is mildly decreased (45-54%).  There was no ST segment deviation noted during stress.  Defect 1: There is a small defect of mild severity present in the basal inferior location. Likely diaphragmatic attenuation artifact.  This is a low risk study. No ischemia identified.  ECHO: 12/05/2015 Left ventricle: Global LV longitudinal strain is normal at -16.2%   There is a false tendon in the LV apex of no clinical   significance. The cavity size was normal. There was moderate   focal basal hypertrophy. Systolic function was normal. The   estimated ejection fraction was in the range of 55% to 60%. Wall   motion was normal; there were no regional wall motion   abnormalities. Features are consistent with a pseudonormal left   ventricular filling pattern, with concomitant abnormal relaxation   and increased filling pressure (grade 2 diastolic dysfunction). - Aortic valve: Trileaflet; mildly thickened leaflets. There was   mild to moderate  regurgitation directed eccentrically in the LVOT   and towards the mitral anterior leaflet. - Mitral valve: There was mild regurgitation. - Pulmonic valve: There was mild regurgitation.  Recent Labs: 05/09/2017: BUN 18; Creatinine, Ser 1.18; Hemoglobin 14.4; Platelets 95; Potassium 4.4; Sodium 140    Lipid Panel No results found for: CHOL, TRIG, HDL, CHOLHDL, VLDL, LDLCALC, LDLDIRECT   Wt Readings from Last 3 Encounters:  05/11/17 192 lb 6.4 oz (87.3 kg)  05/09/17 195 lb (88.5 kg)  12/01/16 189 lb 4.8 oz (85.9 kg)     Other studies Reviewed: Additional studies/ records that were reviewed today include: Office notes, hospital records and testing.  ASSESSMENT AND PLAN:  1.  Chest pain, moderate risk for coronary etiology: I reassured the patient that while he is having some symptoms, they are not consistent with exertion.  However, his exertion level is greatly decreased from previous. He has multiple CRFs. With the need for upcoming shoulder surgery, some sort of stress testing may be indicated.  Since he has sciatica, Mr. Rorie may not be able to do a treadmill.  Dr. Oval Linsey has reviewed the chart and recommends a cardiac CT.  Surgery is currently scheduled for January 25, will schedule this ASAP.  2.  Preoperative evaluation: He has not been going up steps recently.  He has not been exerting himself significantly to make himself short of breath.  He admits that he has been very sedentary for over 2 years.  See above.  3.  Hypertension: Blood pressure and heart rate are at target.  However, he is taking Lopressor 25 mg once a day.  Will change to Toprol-XL 25 mg/day for better 24-hour coverage.  This can be increased as needed.  Can also take extra beta-blocker as needed for SBP greater than 150.  4.  Palpitations: This was in association with his right shoulder pain.  He has a history of PACs and PVCs on previous monitor.  Change beta-blocker to 24-hour formulation for better  coverage, okay to take an extra metoprolol or Toprol-XL as needed for palpitations, or hypertension.  5.  Aortic valve insufficiency: I advised and this was mild and I had trouble hearing the murmur, but I was able to finally get it.  I advised that we did not need to do any further testing at this time nor for at least a year.   Current medicines are reviewed at length with the patient today.  The patient does not have concerns regarding medicines.  The following changes have been made:  Change BB  Labs/ tests ordered today include:  No orders of the defined types were placed in this encounter.    Disposition:   FU with Dr. Oval Linsey  Signed, [name], PA-C  05/11/2017 10:12 AM    [place] Phone: [phone]; Fax: [phone]  This note was written with the assistance of speech recognition software. Please excuse any transcriptional errors.

## 2017-05-11 NOTE — Telephone Encounter (Signed)
Patient calling wanted to see Dr. Oval Linsey today, states that he went to the ED on Friday night for chest pain, arm pain and high Bp. Patient states BP is still high

## 2017-05-11 NOTE — Patient Instructions (Addendum)
Medication INSTRUCTIONS  --START TOPROL XL 25 MG --ONE TABLET DAILY  --- IF SYSTOLIC BLOOD PRESSURE (TOP NUMBER) GREATER THAN 150 , MAY TAKE METOPROLOL TARTRATE 25 MG DAILY AS NEEDED.    WILL DISCUSS WITH DR [place] ,ABOUT FURTHER TESTING AND CALL YOU WITH  RESULTS.-- TO OBTAIN CARDIAC CLEARANCE FOR SHOULDER SURGERY WITH DR SUPPLE     Your physician recommends that you schedule a follow-up appointment WITH DR [facility] IN 3 MONTHS.   If you need a refill on your cardiac medications before your next appointment, please call your pharmacy.

## 2017-05-11 NOTE — Addendum Note (Signed)
Addended by: Raiford Simmonds on: 05/11/2017 04:40 PM   Modules accepted: Orders

## 2017-05-11 NOTE — Telephone Encounter (Signed)
Spoke with patient and he went to ED with chest pain, nausea, and elevated blood pressure. Patient continues to have elevated blood pressure. Scheduled appointment this am with Donnie Aho PA, patient aware of time and location

## 2017-05-12 ENCOUNTER — Telehealth: Payer: Self-pay | Admitting: *Deleted

## 2017-05-12 ENCOUNTER — Encounter: Payer: Self-pay | Admitting: Adult Health

## 2017-05-12 NOTE — Progress Notes (Unsigned)
   Primary Cardiologist: No primary care provider on file.  Chart reviewed as part of pre-operative protocol coverage. Patient was contacted 05/12/2017 in reference to pre-operative risk assessment for pending surgery as outlined below.  Gershon Shorten was last seen on 1, 14, 2019 by [name], PA..  Since that day, Jemmie Rhinehart has not done well with recurrent chest pain.  He has been scheduled for cardiac CT.  This test is pending as it was only ordered on 05/11/2017.  He will need to have this completed prior to allowing for clearance..  Due to new or worsening symptoms, Roy Tokarz will require a follow-up visit for further pre-operative risk assessment.  Pre-op covering staff: - Please schedule appointment and call patient to inform them. - Please contact requesting surgeon's office via preferred method (i.e, phone, fax) to inform them of need for appointment prior to surgery.   Jory Sims DNP 05/12/2017, 3:42 PM

## 2017-05-12 NOTE — Telephone Encounter (Signed)
PATIENT HAS RECEIVED LETTER THROUGH MYCHART AND AWARE AWITING FOR PRE CERT TO BE COMPLETED.

## 2017-05-13 NOTE — Telephone Encounter (Signed)
Waiting for testing result prior to clearance.

## 2017-05-13 NOTE — Telephone Encounter (Signed)
   Primary Cardiologist: No primary care provider on file.  Chart reviewed as part of pre-operative protocol coverage. Patient was contacted 05/12/2017 in reference to pre-operative risk assessment for pending surgery as outlined below.  Arthur Moore was last seen on 1, 14, 2019 by [name], PA..  Since that day, [name] has not done well with recurrent chest pain.  He has been scheduled for cardiac CT.  This test is pending as it was only ordered on 05/11/2017.  He will need to have this completed prior to allowing for clearance..  Due to new or worsening symptoms, [name] will require a follow-up visit for further pre-operative risk assessment.  Pre-op covering staff: - Please schedule appointment and call patient to inform them. - Please contact requesting surgeon's office via preferred method (i.e, phone, fax) to inform them of need for appointment prior to surgery.   Jory Sims DNP 05/12/2017, 3:42 PM

## 2017-05-15 ENCOUNTER — Telehealth: Payer: Self-pay | Admitting: Physician Assistant

## 2017-05-15 ENCOUNTER — Ambulatory Visit (HOSPITAL_COMMUNITY)
Admission: RE | Admit: 2017-05-15 | Discharge: 2017-05-15 | Disposition: A | Discharge status: Home / Self Care | Payer: POS | Source: Ambulatory Visit | Attending: Physician Assistant | Admitting: Physician Assistant

## 2017-05-15 DIAGNOSIS — Z01818 Encounter for other preprocedural examination: Secondary | ICD-10-CM | POA: Diagnosis present

## 2017-05-15 DIAGNOSIS — I2510 Atherosclerotic heart disease of native coronary artery without angina pectoris: Secondary | ICD-10-CM | POA: Insufficient documentation

## 2017-05-15 DIAGNOSIS — R079 Chest pain, unspecified: Secondary | ICD-10-CM | POA: Diagnosis not present

## 2017-05-15 MED ORDER — IOPAMIDOL (ISOVUE-370) INJECTION 76%
INTRAVENOUS | Status: AC
  Administered 2017-05-15: 100 mL
  Filled 2017-05-15: qty 100

## 2017-05-15 MED ORDER — NITROGLYCERIN 0.4 MG SL SUBL
SUBLINGUAL_TABLET | SUBLINGUAL | Status: AC
  Administered 2017-05-15: 0.8 mg via SUBLINGUAL
  Filled 2017-05-15: qty 2

## 2017-05-15 MED ORDER — NITROGLYCERIN 0.4 MG SL SUBL
0.8000 mg | SUBLINGUAL_TABLET | Freq: Once | SUBLINGUAL | Status: AC
  Administered 2017-05-15: 0.8 mg via SUBLINGUAL

## 2017-05-15 NOTE — Telephone Encounter (Signed)
Left a message to call back.

## 2017-05-15 NOTE — Telephone Encounter (Signed)
°  New Prob  Pt has a question regarding medication that was newly prescribed to him a few days ago during his visit. Please call.

## 2017-05-15 NOTE — Telephone Encounter (Signed)
followup     Patient calling back regarding medication dosage being wrong

## 2017-05-15 NOTE — Telephone Encounter (Signed)
Spoke to patient . Patient states at office appointment  With  - toprol xl was to be stared 50 mg , received  25 mg from  Mail order .  He was to use prn  Metoprolol tartrate 25 mg on an as needed basis daily.  Patient has question what to take prior to coronary cta today . Also what to take daily  Will defer to rhonda

## 2017-05-15 NOTE — Telephone Encounter (Signed)
RN reviewed and spoke to [employer] PA.  INFORMATION GIVEN TO PATIENT.  Per Suanne Marker,  Patient is to take TOPROL XL 25 MG daily , metoprolol tartrate 25 mg prn daily as needed for  Increase blood pressure or heart rate.  Per rhonda she will try this dosing for now.  patient is to take 50 mg of lopressor today prior CORONARY CTA .  THEN START TOPROL XL TOMORROW.  Patient was informed and verbalized understanding.

## 2017-05-18 ENCOUNTER — Encounter: Payer: Self-pay | Admitting: Physician Assistant

## 2017-05-18 ENCOUNTER — Telehealth: Payer: Self-pay | Admitting: Cardiovascular Disease

## 2017-05-18 NOTE — Telephone Encounter (Signed)
   Chart reviewed as part of pre-operative protocol coverage. See below. Pt seen by [name] PA-C recently for chest pain and pre-operative evaluation. Cardiac CT result showed 78th percentile calcium score and extensive plaque in proximal LAD, result being sent for FFR (still pending at this time). Further input regarding cardiac clearance should come at the direction of Rhonda Barrett PA-C given ongoing workup of chest pain. Does not appear there is a role further for this to remain in pre-op box. Suanne Marker, once a decision has been made about pre-op clearance, please route to requesting party. Thank you.   Charlie Pitter, PA-C 05/18/2017, 1:50 PM

## 2017-05-18 NOTE — Telephone Encounter (Signed)
Patient calling,  F/u Call from e-mail listed below.  "Unfortunately, I need to know today to let the surgical team know If I have a go or not for surgery this Friday, otherwise I have to cancel & postpone to a later date. Surely by now someone should have read the results as this is a Stat situation. If I don't hear by 4:30 PM today. I will be forced to cancel my surgery. Thank You for your understanding in this matter. "     Informed patient of recent response per note in chart that results are pending as of 10:59 am 05-18-17.  Patient calling for update.

## 2017-05-18 NOTE — Telephone Encounter (Signed)
Discussed with Suanne Marker B PA and FFR will need to be completed prior to surgical clearance. Advised patient and he will cancel surgery for tomorrow.

## 2017-05-18 NOTE — Addendum Note (Signed)
Addended by: Leanord Asal T on: 05/18/2017 02:41 PM   Modules accepted: Orders

## 2017-05-20 ENCOUNTER — Telehealth: Payer: Self-pay | Admitting: Cardiovascular Disease

## 2017-05-20 NOTE — Telephone Encounter (Signed)
Arthur Moore is returning your call

## 2017-05-20 NOTE — Telephone Encounter (Signed)
Can you please call the CT office to let them know the insurance has accepted the FFR.  Per Dr. Aundra Dubin they need this info from our office directly.  Number is [phone].  Thanks so much!

## 2017-05-20 NOTE — Telephone Encounter (Signed)
Returned the call to the patient. He stated that he is very frustrated that he cannot get a clear answer to why his results have not been finalized. He has been informed that insurance just approved the final part to the test and we are waiting for those results. He stated that he already knew this and had read it on MyChart.  The patient stated that he refuses to speak to a PA or nurse about this matter anymore. If his primary cardiologist cannot call him today then he wants another MD to call him and explain the situation.   Message will be routed to his primary cardiologist.

## 2017-05-20 NOTE — Telephone Encounter (Signed)
Left message to call back  

## 2017-05-20 NOTE — Telephone Encounter (Signed)
Patient called me inquiring about his Coronary CTA denial. I advised him that Dr. Aundra Dubin called yesterday and got the [facility] code denials overturned. He wanted to know when results would be back from that and I stated that I would send a message for a nurse to get back with him.  I stated that Dr. Aundra Dubin probably did not send for additional imaging until yesterday since that was when we got the FFR codes approved. He was questioning why these additional images were necessary because from the results in my chart it looks like they should not need any other information because he stated his CAD was resulted severe and stated he wanted a call back today from Dr. Oval Linsey Or Suanne Marker Barrett.  He stated that he would rather please have his cardiologist call him back with this information instead of staff.   I advised him that I would send a phone note and we would take care of it as best as we can.

## 2017-05-20 NOTE — Telephone Encounter (Signed)
Called the patient to inform him that Dr. Oval Linsey would call him as soon as the results were in. He was very appreciative and apologized if he has made anyone feel slighted. He stated that he was nervous about the results.

## 2017-05-20 NOTE — Telephone Encounter (Signed)
The CT office has been called and made aware.

## 2017-05-21 ENCOUNTER — Ambulatory Visit (HOSPITAL_COMMUNITY)
Admission: RE | Admit: 2017-05-21 | Discharge: 2017-05-21 | Disposition: A | Discharge status: Home / Self Care | Payer: POS | Source: Ambulatory Visit | Attending: Physician Assistant | Admitting: Physician Assistant

## 2017-05-21 ENCOUNTER — Telehealth: Payer: Self-pay | Admitting: Cardiovascular Disease

## 2017-05-21 DIAGNOSIS — R079 Chest pain, unspecified: Secondary | ICD-10-CM | POA: Insufficient documentation

## 2017-05-21 DIAGNOSIS — Z01818 Encounter for other preprocedural examination: Secondary | ICD-10-CM

## 2017-05-21 NOTE — Telephone Encounter (Signed)
Patient was called and informed of his CT-A results.  He is at acceptable risk for shoulder surgery.  Appointment has been made for 05/28/17 at 10 am.  He will bring his BP log and come to check lipids prior to that appointment.  Darik Massing C. Oval Linsey, MD, [facility] 05/21/2017 1:23 PM

## 2017-05-22 ENCOUNTER — Telehealth: Payer: Self-pay | Admitting: *Deleted

## 2017-05-22 DIAGNOSIS — E7800 Pure hypercholesterolemia, unspecified: Secondary | ICD-10-CM

## 2017-05-22 DIAGNOSIS — Z5181 Encounter for therapeutic drug level monitoring: Secondary | ICD-10-CM

## 2017-05-22 NOTE — Telephone Encounter (Signed)
-----   Message from Skeet Latch, MD sent at 05/21/2017  1:21 PM EST ----- Regarding: labs Please order fasting lipids and cmp.

## 2017-05-22 NOTE — Telephone Encounter (Signed)
Lab orders placed, patient coming next week

## 2017-05-22 NOTE — Telephone Encounter (Addendum)
   Primary Cardiologist: Skeet Latch, MD  Chart reviewed as part of pre-operative protocol coverage. Given past medical history and time since last visit, based on ACC/AHA guidelines, office visit needed.  After office visit for preop clearance, cardiac CT recommended, based on those results, it was sent for FFR.   ADDENDUM: CT sent for FFR. FFR 0.83 in mid LAD. I do no think there is hemodynamically significant disease in the LAD. The LCx and RCA have no hemodynamically significant disease.  Based on these results, Arthur Moore would be at acceptable risk for the planned procedure without further cardiovascular testing. He has been made aware of this.  I will route this recommendation to the requesting party via Epic fax function and remove from pre-op pool.  Please call with questions.  [name], PA-C 05/22/2017, 1:05 PM

## 2017-05-25 ENCOUNTER — Ambulatory Visit (INDEPENDENT_AMBULATORY_CARE_PROVIDER_SITE_OTHER): Payer: 59 | Admitting: *Deleted

## 2017-05-25 DIAGNOSIS — J309 Allergic rhinitis, unspecified: Secondary | ICD-10-CM | POA: Diagnosis not present

## 2017-05-26 LAB — COMPREHENSIVE METABOLIC PANEL
ALT: 51 IU/L — ABNORMAL HIGH (ref 0–44)
AST: 59 IU/L — ABNORMAL HIGH (ref 0–40)
Albumin/Globulin Ratio: 2.4 — ABNORMAL HIGH (ref 1.2–2.2)
Albumin: 4.5 g/dL (ref 3.6–4.8)
Alkaline Phosphatase: 89 IU/L (ref 39–117)
BUN/Creatinine Ratio: 15 (ref 10–24)
BUN: 16 mg/dL (ref 8–27)
Bilirubin Total: 1.3 mg/dL — ABNORMAL HIGH (ref 0.0–1.2)
CO2: 20 mmol/L (ref 20–29)
Calcium: 9.2 mg/dL (ref 8.6–10.2)
Chloride: 104 mmol/L (ref 96–106)
Creatinine, Ser: 1.1 mg/dL (ref 0.76–1.27)
GFR calc Af Amer: 80 mL/min/{1.73_m2} (ref >59)
GFR calc non Af Amer: 70 mL/min/{1.73_m2} (ref >59)
Globulin, Total: 1.9 g/dL (ref 1.5–4.5)
Glucose: 100 mg/dL — ABNORMAL HIGH (ref 65–99)
Potassium: 4.5 mmol/L (ref 3.5–5.2)
Sodium: 142 mmol/L (ref 134–144)
Total Protein: 6.4 g/dL (ref 6.0–8.5)

## 2017-05-26 LAB — LIPID PANEL
Chol/HDL Ratio: 4.8 ratio (ref 0.0–5.0)
Cholesterol, Total: 116 mg/dL (ref 100–199)
HDL: 24 mg/dL — ABNORMAL LOW (ref >39)
LDL Calculated: 44 mg/dL (ref 0–99)
Triglycerides: 242 mg/dL — ABNORMAL HIGH (ref 0–149)
VLDL Cholesterol Cal: 48 mg/dL — ABNORMAL HIGH (ref 5–40)

## 2017-05-27 ENCOUNTER — Encounter: Payer: Self-pay | Admitting: Cardiovascular Disease

## 2017-05-28 ENCOUNTER — Encounter: Payer: Self-pay | Admitting: Cardiovascular Disease

## 2017-05-28 ENCOUNTER — Ambulatory Visit: Payer: POS | Admitting: Cardiovascular Disease

## 2017-05-28 VITALS — BP 126/78 | HR 81 | Ht 71.5 in | Wt 193.0 lb

## 2017-05-28 DIAGNOSIS — I10 Essential (primary) hypertension: Secondary | ICD-10-CM | POA: Diagnosis not present

## 2017-05-28 DIAGNOSIS — I2510 Atherosclerotic heart disease of native coronary artery without angina pectoris: Secondary | ICD-10-CM | POA: Diagnosis not present

## 2017-05-28 DIAGNOSIS — Z5181 Encounter for therapeutic drug level monitoring: Secondary | ICD-10-CM

## 2017-05-28 DIAGNOSIS — E785 Hyperlipidemia, unspecified: Secondary | ICD-10-CM | POA: Diagnosis not present

## 2017-05-28 DIAGNOSIS — E7800 Pure hypercholesterolemia, unspecified: Secondary | ICD-10-CM

## 2017-05-28 MED ORDER — ICOSAPENT ETHYL 1 G PO CAPS: 1.0000 g | ORAL_CAPSULE | Freq: Two times a day (BID) | ORAL | 3 refills | Status: DC

## 2017-05-28 NOTE — Progress Notes (Signed)
Cardiology Office Note   Date:  06/01/2017  ID:  Arthur Moore, DOB 04/09/1951, MRN [id number]  PCP:  Leighton Ruff, MD  Cardiologist:   Skeet Latch, MD   No chief complaint on file.   History of Present Illness: Arthur Moore is a 67 y.o. male with coronary artery calcification, mild-moderate aortic insufficiency, hypertension, asthma,TIA, OSA not on CPAP, and depression who presents for follow up.  Arthur Moore reports neck pain due to prior cervical spine injury.  Arthur Moore wore a Holter monitor 06/2013 that revealed rare PACs and PVCs.  He previously followed up with a cardiologist in [place] due to mild AR.  He subsequently had an echo at the [place] in 2015 that reported it as mild-moderate.  He had a repeat echocardiogram 12/05/15 revealed LVEF 55-60% with grade 2 diastolic. Aortic regurgitation remained mild to moderate.  Arthur Moore reported fatigue and atypical chest pain.  He was referred for an exercise Myoview/1/18 that revealed LVEF 49% and asynchronous contraction. There was a small defect in the basal inferior region that was thought to be diaphragmatic attenuation.  He achieved 7 METS on a Bruce protocol.  He called our office after these results due to persistent chest pain and it was suggested that he follow up with GI.  He was subsequently diagnosed with diverticulitis.  Since his last appointment Arthur Moore saw Arthur Ferries, PA-C, for chest pain.  He had previously been seen in the ED with chest pain.  Cardiac enzymes and CT of the chest, abdomen, and pelvis were unremarkable.  His chest pain was atypical and occurred when he was trying to lie down in bed at night.  He was referred for coronary CT-A 04/2017 that revealed a coronary calcium score 361 which is 70th percentile.  He had extensive calcified plaque in the proximal LAD with possible moderate stenosis.  It was sent for FFR which was 0.83.  This does not reach the threshold of obstructive stenosis.   He was also found to have insignificant stenosis in the left circumflex and RCA.  He was scheduled for shoulder surgery 1/25 but this had to be delayed as the CT-A results were pending.  He has been feeling well physically and has no chest pain or shortness of breath.  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  He has been walking for 20-25 minutes daily.  He tried to swim but was only able to swim a couple laps at a time due to dyspnea.  He was discouraged by this.  He notes that he has also been struggling with anxiety and had a panic attack. 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   Past Medical History:  Diagnosis Date  . Aortic insufficiency   . Arthritis   . Asthma   . Cervical disc disease   . Depression   . Diastolic dysfunction 8/56/3149  . Hyperlipidemia 08/24/2015  . Hypertension   . Lumbar disc disease   . Psoriasis     Past Surgical History:  Procedure Laterality Date  . CHOLECYSTECTOMY  2012  . HERNIA REPAIR    . PAROTIDECTOMY Right 1992  . PROSTATE SURGERY  2002, 2004  . SINOSCOPY    . SINUS EXPLORATION  2013  . SPINE SURGERY  2013   L C7-T1 laminectomy and discectomy  . TONSILLECTOMY       Current Outpatient Medications  Medication Sig Dispense Refill  . albuterol (PROVENTIL HFA;VENTOLIN HFA) 108 (90 Base)  MCG/ACT inhaler Inhale 1 puff into the lungs as needed for wheezing or shortness of breath.    Marland Kitchen aspirin EC 81 MG tablet Take 81 mg by mouth daily.    . budesonide-formoterol (SYMBICORT) 160-4.5 MCG/ACT inhaler Inhale 2 puffs into the lungs 2 (two) times daily.    . cetirizine (ZYRTEC) 10 MG tablet Take 10 mg by mouth daily as needed for allergies.    Marland Kitchen lamoTRIgine (LAMICTAL) 200 MG tablet Take 200 mg by mouth daily.    . metoprolol succinate (TOPROL XL) 25 MG 24 hr tablet Take 1 tablet (25 mg total) by mouth daily. 90 tablet 3  . metoprolol tartrate (LOPRESSOR) 25 MG tablet Take 1 tablet (25 mg total) by mouth daily as needed. ,if  Systolic  blood pressure ( top number) greater than 150. 90 tablet 3  . pantoprazole (PROTONIX) 40 MG tablet Take 40 mg by mouth daily.     . rosuvastatin (CRESTOR) 10 MG tablet TAKE 1 TABLET DAILY 90 tablet 3  . Icosapent Ethyl (VASCEPA) 1 g CAPS Take 1 g by mouth 2 (two) times daily. WITH MEALS 120 capsule 3   No current facility-administered medications for this visit.     Allergies:   Atorvastatin    Social History:  The patient  reports that  has never smoked. he has never used smokeless tobacco. He reports that he does not drink alcohol or use drugs.   Family History:  The patient's family history includes Colon cancer in his mother; Lung cancer in his father.    ROS:  Please see the history of present illness.   Otherwise, review of systems are positive for none.   All other systems are reviewed and negative.    PHYSICAL EXAM: VS:  BP 126/78   Pulse 81   Ht 5' 11.5" (1.816 m)   Wt 193 lb (87.5 kg)   BMI 26.54 kg/m  , BMI Body mass index is 26.54 kg/m. GENERAL:  Anxious appearing.  No acute distress. HEENT: Pupils equal round and reactive, fundi not visualized, oral mucosa unremarkable NECK:  No jugular venous distention, waveform within normal limits, carotid upstroke brisk and symmetric, no bruits, no thyromegaly LYMPHATICS:  No cervical adenopathy LUNGS:  Clear to auscultation bilaterally HEART:  RRR.  PMI not displaced or sustained,S1 and S2 within normal limits, no S3, no S4, no clicks, no rubs, no murmurs ABD:  Flat, positive bowel sounds normal in frequency in pitch, no bruits, no rebound, no guarding, no midline pulsatile mass, no hepatomegaly, no splenomegaly EXT:  2 plus pulses throughout, no edema, no cyanosis no clubbing SKIN:  No rashes no nodules NEURO:  Cranial nerves II through XII grossly intact, motor grossly intact throughout PSYCH:  Cognitively intact, oriented to person place and time   EKG:  EKG is not ordered today. 06/20/16: Sinus rhythm. Rate 64  bpm.  Echo 04/25/14: LVEF 60%.  Aortic valve sclerosis without stenosis. Mild aortic insufficiency.  ETT 04/2013:  Exercise 8 minutes and 44 seconds on a Bruce protocol. Peak heart rate 136. 9.8 METS. No ischemic changes.  24 Hour Holter: Underlying rhythm is sinus. Average heart rate 74, minimum 52, max 123. Rare PACs and PVCs. No significant arrhythmia noted.  Echo 12/05/15: Study Conclusions  - Left ventricle: Global LV longitudinal strain is normal at -16.2%   There is a false tendon in the LV apex of no clinical   significance. The cavity size was normal. There was moderate   focal basal hypertrophy.  Systolic function was normal. The   estimated ejection fraction was in the range of 55% to 60%. Wall   motion was normal; there were no regional wall motion   abnormalities. Features are consistent with a pseudonormal left   ventricular filling pattern, with concomitant abnormal relaxation   and increased filling pressure (grade 2 diastolic dysfunction). - Aortic valve: Trileaflet; mildly thickened leaflets. There was   mild to moderate regurgitation directed eccentrically in the LVOT   and towards the mitral anterior leaflet. - Mitral valve: There was mild regurgitation. - Pulmonic valve: There was mild regurgitation.   Exercise Myoview 06/26/16: Nuclear stress EF: 49%. Asynchronous contraction.  The left ventricular ejection fraction is mildly decreased (45-54%).  There was no ST segment deviation noted during stress.  Defect 1: There is a small defect of mild severity present in the basal inferior location. Likely diaphragmatic attenuation artifact.  This is a low risk study. No ischemia identified  Coronary CT-A 05/15/17: IMPRESSION: 1. Coronary artery calcium score 361 Agatston units, placing the patient in the 78th percentile for age and gender, suggesting intermediate to high risk for future cardiac events.  2. Extensive calcified plaque in the proximal LAD, possible  moderate stenosis though blooming artifact may make the disease look worse than it actually is.  FFR 0.83 in mid LAD. I do no think there is hemodynamically significant disease in the LAD. The LCx and RCA have no hemodynamically significant disease.  Recent Labs: 05/09/2017: Hemoglobin 14.4; Platelets 95 05/26/2017: ALT 51; BUN 16; Creatinine, Ser 1.10; Potassium 4.5; Sodium 142    Lipid Panel    Component Value Date/Time   CHOL 116 05/26/2017 1056   TRIG 242 (H) 05/26/2017 1056   HDL 24 (L) 05/26/2017 1056   CHOLHDL 4.8 05/26/2017 1056   LDLCALC 44 05/26/2017 1056     11/2014: Total cholesterol 130, HDL 39, LDL 65, triglycerides 116  Total chol 133, tri 211, ldl 55, hdl 34  Wt Readings from Last 3 Encounters:  05/28/17 193 lb (87.5 kg)  05/11/17 192 lb 6.4 oz (87.3 kg)  05/09/17 195 lb (88.5 kg)      ASSESSMENT AND PLAN:  # Moderate, non-obstructive CAD:  # Hyperlipidemia:  Coronary calcium score was 78th percentile and he had moderate, non-obstructive CAD.  I had an extensive discussion with Mr. Shiraishi and his wife about the test and his prognosis.  We discussed the importance of continuing the recently started exercise routine.  Continue to maintain LDL <70.  Given his hypertriglyceridemia and CAD we will add Vascepa 1g bid.  He is on low dose rosuvastatin 2/2 LDL 44 and hepatic fibrosis.  We will also refer him to a nutritionist to assist with following a heart healthy, Mediterranean style diet.   # Atypical chest pain: Resolved.    # Aortic regurgitation: Stable on echo 11/2015. Repeat echo 11/2017.   # Diastolic dysfunction: Grade 2 on echo.  No symptoms of heart failure.  BP remains controlled.    # Hypertension: BP well-controlled. Continue metoprolol.   # TIA: Continue statin and aspirin 81 mg daily.  # Depression: # Anxiety:   Mr. Kia remains very depressed. Symptoms are much better controlled.  He continues to struggle with anxiety.  I have  recommended that he follow up with his PCP and psychiatrist.   Time spent: 60 minutes-Greater than 50% of this time was spent in counseling, explanation of diagnosis, planning of further management, and coordination of care.  Current medicines are reviewed  at length with the patient today.  The patient does not have concerns regarding medicines.  The following changes have been made: Icosapent Ethyl 1g bid.  Labs/ tests ordered today include:   Orders Placed This Encounter  Procedures  . Lipid panel  . Comprehensive metabolic panel  . Ambulatory referral to Nutrition and Diabetic Education    Disposition:   FU with Luka Stohr C. Oval Linsey, MD in 4 months.    y Signed, Skeet Latch, MD  06/01/2017 9:22 AM    [place]

## 2017-05-28 NOTE — Patient Instructions (Addendum)
Medication Instructions:  START VASCEPA 1 GRAM TWICE A DAY   Labwork: NONE  Testing/Procedures: NONE  Follow-Up: Your physician recommends that you schedule a follow-up appointment in: 4 MONTH OV  WILL WORK ON REFERRING YOU TO A NUTRITIONIST, CALL ME WITH A NAME OF ONE YOUR WIFE SAW [phone] OR SEND IN [facility]   Any Other Special Instructions Will Be Listed Below (If Applicable). MONITOR YOUR BLOOD PRESSURE ONLY ONCE PER DAY   If you need a refill on your cardiac medications before your next appointment, please call your pharmacy.

## 2017-05-29 ENCOUNTER — Encounter: Payer: Self-pay | Admitting: Cardiovascular Disease

## 2017-05-29 ENCOUNTER — Telehealth: Payer: Self-pay | Admitting: *Deleted

## 2017-05-29 MED ORDER — ICOSAPENT ETHYL 1 G PO CAPS: 1.0000 g | ORAL_CAPSULE | Freq: Two times a day (BID) | ORAL | 3 refills | Status: DC

## 2017-05-29 NOTE — Telephone Encounter (Signed)
This has been done as requested   Message -----  From: [name]  Sent: 05/29/2017 12:22 PM  To: [facility] Clinical Pool  Subject: Visit Follow-Up Question               ----- Message from [place], Generic sent at 05/29/2017 12:22 PM EST -----    Dr Oval Linsey: Can you withdraw the prescription for Icosapent Ethyl 1 g Caps  Commonly known as: VASCEPA from [facility] and send a 90 day prescription to [facility] in [place] as this will be the least costly expense to [place] because we have the discount card your nurse gave [place]. Thank You.    [name]

## 2017-05-31 ENCOUNTER — Encounter: Payer: Self-pay | Admitting: Cardiovascular Disease

## 2017-06-01 ENCOUNTER — Encounter: Payer: Self-pay | Admitting: Cardiovascular Disease

## 2017-06-01 ENCOUNTER — Ambulatory Visit (INDEPENDENT_AMBULATORY_CARE_PROVIDER_SITE_OTHER): Payer: 59 | Admitting: *Deleted

## 2017-06-01 DIAGNOSIS — J309 Allergic rhinitis, unspecified: Secondary | ICD-10-CM | POA: Diagnosis not present

## 2017-06-02 ENCOUNTER — Telehealth: Payer: Self-pay | Admitting: *Deleted

## 2017-06-02 NOTE — Telephone Encounter (Signed)
Submitted PR for Vascepa which has been approved  [employer] Key: [id number] - PA Case ID: [id number] - Rx #: [id number] Need help? Call us at [phone]  Outcome  Approvedtoday  GBEEFE:[id number];OITGPQ:[id number];Review Type:Prior Auth;Coverage Start Date:05/03/2017;Coverage End Date:06/01/2020;

## 2017-06-04 ENCOUNTER — Encounter: Payer: Self-pay | Admitting: *Deleted

## 2017-06-09 ENCOUNTER — Encounter: Payer: Self-pay | Admitting: Allergy and Immunology

## 2017-06-09 ENCOUNTER — Ambulatory Visit: Payer: 59 | Admitting: Allergy and Immunology

## 2017-06-09 VITALS — BP 100/60 | HR 68 | Resp 20

## 2017-06-09 DIAGNOSIS — J4540 Moderate persistent asthma, uncomplicated: Secondary | ICD-10-CM

## 2017-06-09 DIAGNOSIS — J3089 Other allergic rhinitis: Secondary | ICD-10-CM

## 2017-06-09 DIAGNOSIS — K219 Gastro-esophageal reflux disease without esophagitis: Secondary | ICD-10-CM

## 2017-06-09 MED ORDER — PANTOPRAZOLE SODIUM 40 MG PO TBEC: 40.0000 mg | DELAYED_RELEASE_TABLET | Freq: Every day | ORAL | 1 refills | Status: DC

## 2017-06-09 NOTE — Progress Notes (Signed)
Follow-up Note   Referring Provider: Leighton Ruff, MD Primary Provider: Leighton Ruff, MD Date of Office Visit: 06/09/2017  Subjective:   Arthur Moore (DOB: 02-14-1951) is a 67 y.o. male who returns to the [facility] on 06/09/2017 in re-evaluation of the following:  HPI: [name] returns to this clinic in reevaluation of his asthma and allergic rhinoconjunctivitis and LPR.  His last visit to this clinic was 16 December 2016.  Overall he has done very well and has not required a systemic steroid or an antibiotic to treat any type of respiratory tract issue.  He does not use a short acting bronchodilator.  He does not exercise because of a series of musculoskeletal issues.  His immunotherapy is going quite well currently at every 2 weeks.  He has not had any adverse effect from utilizing this form of treatment.  His reflux and his throat issue are under excellent control at this point in time.  He did receive the flu vaccine and also received a pneumonia vaccine and shingles vaccine in 2018.  Allergies as of 06/09/2017      Reactions   Atorvastatin Other (See Comments)   Calf pain.      Medication List      albuterol 108 (90 Base) MCG/ACT inhaler Commonly known as:  PROVENTIL HFA;VENTOLIN HFA Inhale 1 puff into the lungs as needed for wheezing or shortness of breath.   aspirin EC 81 MG tablet Take 81 mg by mouth daily.   budesonide-formoterol 160-4.5 MCG/ACT inhaler Commonly known as:  SYMBICORT Inhale 2 puffs into the lungs 2 (two) times daily.   cetirizine 10 MG tablet Commonly known as:  ZYRTEC Take 10 mg by mouth daily as needed for allergies.   Icosapent Ethyl 1 g Caps Commonly known as:  VASCEPA Take 1 g by mouth 2 (two) times daily. WITH MEALS   lamoTRIgine 200 MG tablet Commonly known as:  LAMICTAL Take 200 mg by mouth daily.   metoprolol succinate 25 MG 24 hr tablet Commonly known as:  TOPROL XL Take 1 tablet (25 mg total) by  mouth daily.   pantoprazole 40 MG tablet Commonly known as:  PROTONIX Take 1 tablet (40 mg total) by mouth daily.   rosuvastatin 10 MG tablet Commonly known as:  CRESTOR TAKE 1 TABLET DAILY       Past Medical History:  Diagnosis Date  . Aortic insufficiency   . Arthritis   . Asthma   . Cervical disc disease   . Depression   . Diastolic dysfunction 5/39/7673  . Hyperlipidemia 08/24/2015  . Hypertension   . Lumbar disc disease   . Psoriasis     Past Surgical History:  Procedure Laterality Date  . CHOLECYSTECTOMY  2012  . HERNIA REPAIR    . PAROTIDECTOMY Right 1992  . PROSTATE SURGERY  2002, 2004  . SINOSCOPY    . SINUS EXPLORATION  2013  . SPINE SURGERY  2013   L C7-T1 laminectomy and discectomy  . TONSILLECTOMY      Review of systems negative except as noted in HPI / PMHx or noted below:  Review of Systems  Constitutional: Negative.   HENT: Negative.   Eyes: Negative.   Respiratory: Negative.   Cardiovascular: Negative.   Gastrointestinal: Negative.   Genitourinary: Negative.   Musculoskeletal: Negative.   Skin: Negative.   Neurological: Negative.   Endo/Heme/Allergies: Negative.   Psychiatric/Behavioral: Negative.      Objective:   Vitals:   06/09/17 1520  BP: 100/60  Pulse: 68  Resp: 20          Physical Exam  Constitutional: He is well-developed, well-nourished, and in no distress.  HENT:  Head: Normocephalic.  Right Ear: Tympanic membrane, external ear and ear canal normal.  Left Ear: Tympanic membrane, external ear and ear canal normal.  Nose: Nose normal. No mucosal edema or rhinorrhea.  Mouth/Throat: Uvula is midline, oropharynx is clear and moist and mucous membranes are normal. No oropharyngeal exudate.  Eyes: Conjunctivae are normal.  Neck: Trachea normal. No tracheal tenderness present. No tracheal deviation present. No thyromegaly present.  Cardiovascular: Normal rate, regular rhythm, S1 normal, S2 normal and normal heart  sounds.  No murmur heard. Pulmonary/Chest: Breath sounds normal. No stridor. No respiratory distress. He has no wheezes. He has no rales.  Musculoskeletal: He exhibits no edema.  Lymphadenopathy:       Head (right side): No tonsillar adenopathy present.       Head (left side): No tonsillar adenopathy present.    He has no cervical adenopathy.  Neurological: He is alert. Gait normal.  Skin: No rash noted. He is not diaphoretic. No erythema. Nails show no clubbing.  Psychiatric: Mood and affect normal.    Diagnostics:   The patient had an Asthma Control Test with the following results: ACT Total Score: 25.    Assessment and Plan:   1. Asthma, moderate persistent, well-controlled   2. Other allergic rhinitis   3. LPRD (laryngopharyngeal reflux disease)     1. Continue immunotherapy and EpiPen. Increase to every 3 weeks when completing build up with current vial.   2. Continue Symbicort 160 - 2 inhalations twice a day  3. Continue nasal flunisolide 1-2 sprays each nostril one time per day during upper airway symptoms  4. Continue Protonix 40 mg one time per day  5. Continue Zyrtec and albuterol HFA if needed  6. Return to clinic in 6 months or earlier if problem.  Overall [name] has really done very well on his current medical plan and we will continue to have him use therapy directed against atopic respiratory disease including immunotherapy and anti-inflammatory agents for his respiratory tract at the same time that he addresses the issue with his reflux.  I will see him back in this clinic in 6 months or earlier if there is a problem.  Allena Katz, MD Allergy / Immunology [place]

## 2017-06-09 NOTE — Patient Instructions (Signed)
  1. Continue immunotherapy and EpiPen. Increase to every 3 weeks when completing build up with current vial.   2. Continue Symbicort 160 - 2 inhalations twice a day  3. Continue nasal flunisolide 1-2 sprays each nostril one time per day during upper airway symptoms  4. Continue Protonix 40 mg one time per day  5. Continue Zyrtec and albuterol HFA if needed  6. Return to clinic in 6 months or earlier if problem.

## 2017-06-10 ENCOUNTER — Encounter: Payer: Self-pay | Admitting: Cardiovascular Disease

## 2017-06-10 ENCOUNTER — Encounter: Payer: Self-pay | Admitting: Allergy and Immunology

## 2017-06-11 ENCOUNTER — Encounter: Payer: POS | Attending: Cardiovascular Disease | Admitting: Registered"

## 2017-06-11 ENCOUNTER — Encounter: Payer: Self-pay | Admitting: Registered"

## 2017-06-11 DIAGNOSIS — E785 Hyperlipidemia, unspecified: Secondary | ICD-10-CM | POA: Diagnosis not present

## 2017-06-11 DIAGNOSIS — I10 Essential (primary) hypertension: Secondary | ICD-10-CM | POA: Insufficient documentation

## 2017-06-11 DIAGNOSIS — Z713 Dietary counseling and surveillance: Secondary | ICD-10-CM | POA: Diagnosis present

## 2017-06-11 NOTE — Progress Notes (Signed)
  Medical Nutrition Therapy:  Appt start time: 10:45 end time:  11:45.   Assessment:  Primary concerns today: Pt arrives with wife stating he has 50% blockage and 25% blockage in left and right. Pt reports his recent lab values are cholesterol 113, LDL 40, HDL 25, and triglycerides 230. Pt's wife reports she offers the pt food and tries to buy and prepare what will be good for the pt such as whoel grains, fruits, vegetables, makes her own vinaigrette salad dressing with extra virgin olive oil. Pt states he has given up soda, loves meat, potatoes, salads, turnovers, sushi. Pt states he only drinks beer every 4-6 weeks. Pt states he likes fresh asparagus, corn on cobb, tomatoes. Pt states he has reduced red meat consumption from 3x/week to once/month. Pt states he does not prefer [place] burgers. Pt states he was exercising until recently; sciatic and shoulder injury limit physical activity. 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 Pt states his wife makes the meals and he just eats what she cooks. Pt's wife states she offers meals and snacks to him but he does not always eat what is being offered. Pt states recent diagnosis concerning his cardiovascular system is causing him to want to make the necessary dietary changes for his health.   Preferred Learning Style:   No preference indicated   Learning Readiness:   Contemplating  Ready  Change in progress   MEDICATIONS: See list   DIETARY INTAKE:  Usual eating pattern includes 2 meals and 1 snacks per day.  Everyday foods include oatmeal, [place], fish, chicken, salad, potatoes.  Avoided foods include vegetarian meat substitutes.    24-hr recall:  B ( AM): steel cut oatmeal, cinnamon, raisins, whole bread or 2 poached eggs Snk ( AM): none  L ( PM): skips Snk ( PM): non-fat greek yogurt D ( PM): red meat or chicken/turkey/fish with salad, potatoes Snk ( PM): none Beverages: lemonade,  grape juice, orange juice   Usual physical activity: walking 30 min, 3 days and swimming 2 days/week  Estimated energy needs: 2000 calories 225 g carbohydrates 150 g protein 56 g fat  Progress Towards Goal(s):  In progress.   Nutritional Diagnosis:  NI-5.8.5 Inadeqate fiber intake As related to lack of fiber-containing foods.  As evidenced by pt report of estimated insufficient fiber intake when compared to recommended amounts (38 g/day men).    Intervention:  Nutrition education and counseling. Pt was educated and counseled on MyPlate method, physical activity, and reading nutrition facts labels related to sodium content.  Goals: - Aim to eat at least 3 meals a day and snack as needed.  - Increase fruits and vegetables intake. - Read nutrition facts label and be mindful of sodium contents. - Aim for at least 30 minutes of physical activity, 5 days a week. - Use MyPlate handout for meals. - Meal plan together.   Teaching Method Utilized:  Visual Auditory Hands on  Handouts given during visit include:  High Triglycerides Nutrition Therapy  MyPlate   Barriers to learning/adherence to lifestyle change: none identified  Demonstrated degree of understanding via:  Teach Back   Monitoring/Evaluation:  Dietary intake, exercise, and body weight prn.

## 2017-06-11 NOTE — Patient Instructions (Addendum)
-   Aim to eat at least 3 meals a day and snack as needed.   - Increase fruits and vegetables intake.  - Read nutrition facts label and be mindful of sodium contents.  - Aim for at least 30 minutes of physical activity, 5 days a week.  - Use MyPlate handout for meals.  - Meal plan together.

## 2017-06-16 ENCOUNTER — Ambulatory Visit (INDEPENDENT_AMBULATORY_CARE_PROVIDER_SITE_OTHER): Payer: 59 | Admitting: *Deleted

## 2017-06-16 DIAGNOSIS — J309 Allergic rhinitis, unspecified: Secondary | ICD-10-CM

## 2017-06-24 ENCOUNTER — Telehealth: Payer: Self-pay | Admitting: Allergy and Immunology

## 2017-06-24 ENCOUNTER — Telehealth: Payer: Self-pay

## 2017-06-24 ENCOUNTER — Ambulatory Visit (INDEPENDENT_AMBULATORY_CARE_PROVIDER_SITE_OTHER): Payer: 59

## 2017-06-24 DIAGNOSIS — J309 Allergic rhinitis, unspecified: Secondary | ICD-10-CM

## 2017-06-24 NOTE — Telephone Encounter (Signed)
Patient has signed the form and has received the spacer.

## 2017-06-24 NOTE — Telephone Encounter (Signed)
Lets try 1/2 dose for both extracts with next injection and see what happens.

## 2017-06-24 NOTE — Telephone Encounter (Signed)
Patient wants a spacer Can it be mailed to him?? Does he have to pay for it - or will it be billed through insurance

## 2017-06-24 NOTE — Telephone Encounter (Signed)
Pt states that about  2 hrs after his injection he is having "flu like" sxs (body aches). Is fine the next day.  Please advise if you want his allergy dose adjusted. He is on Red 1:100 ( DM-W/ G-T/ and Cat) @ .50 Q 3 weeks

## 2017-06-24 NOTE — Telephone Encounter (Signed)
I called [place] and spoke with Damita, Arush was still in the office and she was going to get someone to get him an [employer] spacer.

## 2017-06-25 NOTE — Telephone Encounter (Signed)
Document in pts flowsheet

## 2017-06-29 NOTE — Telephone Encounter (Signed)
Patient called back about the spacer. Arthur Moore his was run through insurance and his wife's was not [employer] [birth year]). He wants to know why he has to pay $50 and why hers was not run through insurance. Please call him back to clarify.

## 2017-06-30 NOTE — Telephone Encounter (Signed)
Lm for Arthur Moore at [employer] to call us back

## 2017-06-30 NOTE — Telephone Encounter (Signed)
Spoke with pt and informed him I would reach out to Arthur Moore out [employer] rep and see what she has to say about this ordeal.

## 2017-06-30 NOTE — Telephone Encounter (Signed)
[name] from [employer] came into the office states Arthur Moore should pay for it. She will look into these and take care of it. [name] gave info given to her to look into it.

## 2017-06-30 NOTE — Telephone Encounter (Signed)
Lm for to call us back

## 2017-07-02 ENCOUNTER — Telehealth: Payer: Self-pay | Admitting: Allergy and Immunology

## 2017-07-02 NOTE — Telephone Encounter (Signed)
Patient called back to find out the status of the spacer he had spoken to [place] about.

## 2017-07-02 NOTE — Telephone Encounter (Addendum)
Spoke to patient advised we did talk with [name] from [employer] and she took his info with her. Patient states that [employer] did call him and states that they do not accept his insurance advised we would reach out to [place] again and to allow 30 day turn around. Patient is upset.

## 2017-07-03 NOTE — Telephone Encounter (Signed)
Arthur Moore called here in [place] yesterday afternoon to see if I could reach out to Arthur Moore again. I informed him I would call [employer] today when I came in. I contacted [employer] and talked with a Arthur Moore in there billing department. This Arthur Moore informed me that the pt can pay for 1 spacer and have the other written off. I said that sounds like a good offer but I also wanted to contact his insurance and see about a PA. I took Arthur Moore extension # and then proceed to calling his insurance company. I was on hold for 15 minutes and no one ever answered the phone. I ended up calling the pt and informing him of what Arthur Moore at [employer] said, and that I had tried calling his insurance with no answer. While I was on the phone with him he 3 way called his insurance and after 5 minutes no one answered. I gave him Arthur Moore number and extension and he said he would let me know what he decides as he was going to stay on line and try to talk to his insurance company.  Once off the phone with him I called Arthur Moore and informed her of what all has occurred.

## 2017-07-03 NOTE — Telephone Encounter (Signed)
Called and spoke to Welch with [employer] and informed her patient was upset.  Arthur Moore is going to check on status and call me back.

## 2017-07-03 NOTE — Telephone Encounter (Signed)
Called [place] and gave her updated information from [place].  Delana Meyer has Designer, industrial/product in billing and waiting on response.  Jasmine will call the office after she gets response back from [employer] billing.

## 2017-07-06 NOTE — Telephone Encounter (Signed)
Spoke with pt and he informed me that he contacted his insurance and that they do inface cover the spacers so he contacted [employer] billing (carrie) and informed them to resend evrything to his insurance that they was indeed covered. I informed him to let me know what was going on.

## 2017-07-06 NOTE — Telephone Encounter (Signed)
Lm for pt to call us back  

## 2017-07-21 ENCOUNTER — Ambulatory Visit (INDEPENDENT_AMBULATORY_CARE_PROVIDER_SITE_OTHER): Payer: 59 | Admitting: *Deleted

## 2017-07-21 DIAGNOSIS — J309 Allergic rhinitis, unspecified: Secondary | ICD-10-CM | POA: Diagnosis not present

## 2017-07-27 ENCOUNTER — Telehealth: Payer: Self-pay | Admitting: Neurology

## 2017-07-27 NOTE — Telephone Encounter (Signed)
ROI FAXED TO [phone] PER PATIENT

## 2017-08-03 ENCOUNTER — Other Ambulatory Visit: Payer: Self-pay | Admitting: Gastroenterology

## 2017-08-03 DIAGNOSIS — K7460 Unspecified cirrhosis of liver: Secondary | ICD-10-CM

## 2017-08-04 ENCOUNTER — Other Ambulatory Visit: Payer: Self-pay | Admitting: Cardiovascular Disease

## 2017-08-05 LAB — LIPID PANEL
Chol/HDL Ratio: 3.9 ratio (ref 0.0–5.0)
Cholesterol, Total: 146 mg/dL (ref 100–199)
HDL: 37 mg/dL — ABNORMAL LOW (ref >39)
LDL Calculated: 77 mg/dL (ref 0–99)
Triglycerides: 160 mg/dL — ABNORMAL HIGH (ref 0–149)
VLDL Cholesterol Cal: 32 mg/dL (ref 5–40)

## 2017-08-05 LAB — COMPREHENSIVE METABOLIC PANEL
ALT: 57 IU/L — ABNORMAL HIGH (ref 0–44)
AST: 43 IU/L — ABNORMAL HIGH (ref 0–40)
Albumin/Globulin Ratio: 2.8 — ABNORMAL HIGH (ref 1.2–2.2)
Albumin: 4.7 g/dL (ref 3.6–4.8)
Alkaline Phosphatase: 87 IU/L (ref 39–117)
BUN/Creatinine Ratio: 17 (ref 10–24)
BUN: 21 mg/dL (ref 8–27)
Bilirubin Total: 1.3 mg/dL — ABNORMAL HIGH (ref 0.0–1.2)
CO2: 21 mmol/L (ref 20–29)
Calcium: 9.2 mg/dL (ref 8.6–10.2)
Chloride: 108 mmol/L — ABNORMAL HIGH (ref 96–106)
Creatinine, Ser: 1.24 mg/dL (ref 0.76–1.27)
GFR calc Af Amer: 70 mL/min/{1.73_m2} (ref >59)
GFR calc non Af Amer: 60 mL/min/{1.73_m2} (ref >59)
Globulin, Total: 1.7 g/dL (ref 1.5–4.5)
Glucose: 93 mg/dL (ref 65–99)
Potassium: 4.1 mmol/L (ref 3.5–5.2)
Sodium: 144 mmol/L (ref 134–144)
Total Protein: 6.4 g/dL (ref 6.0–8.5)

## 2017-08-07 ENCOUNTER — Encounter: Payer: Self-pay | Admitting: Cardiovascular Disease

## 2017-08-07 ENCOUNTER — Ambulatory Visit: Payer: POS | Admitting: Cardiovascular Disease

## 2017-08-07 VITALS — BP 122/66 | HR 70 | Ht 71.0 in | Wt 177.0 lb

## 2017-08-07 DIAGNOSIS — I351 Nonrheumatic aortic (valve) insufficiency: Secondary | ICD-10-CM

## 2017-08-07 DIAGNOSIS — I2510 Atherosclerotic heart disease of native coronary artery without angina pectoris: Secondary | ICD-10-CM

## 2017-08-07 DIAGNOSIS — I10 Essential (primary) hypertension: Secondary | ICD-10-CM

## 2017-08-07 DIAGNOSIS — E785 Hyperlipidemia, unspecified: Secondary | ICD-10-CM

## 2017-08-07 NOTE — Progress Notes (Signed)
Cardiology Office Note   Date:  08/07/2017  ID:  Arthur Moore, DOB August 06, 1950, MRN [id number]  PCP:  Arthur Ruff, MD  Cardiologist:   Arthur Latch, MD   Chief Complaint  Patient presents with  . Follow-up    History of Present Illness: Arthur Moore is a 67 y.o. male with coronary artery calcification, mild-moderate aortic insufficiency, hypertension, asthma,TIA, OSA not on CPAP, liver fibrosis, anxiety, and depression who presents for follow up.  Arthur Moore wore a Holter monitor 06/2013 that revealed rare PACs and PVCs.  He previously followed up with a cardiologist in [place] due to mild AR.  He subsequently had an echo at the [place] in 2015 that reported it as mild-moderate.  He had a repeat echocardiogram 12/05/15 revealed LVEF 55-60% with grade 2 diastolic. Aortic regurgitation remained mild to moderate.  Arthur Moore reported fatigue and atypical chest pain.  He was referred for an exercise Myoview 3/18 that revealed LVEF 49% and asynchronous contraction. There was a small defect in the basal inferior region that was thought to be diaphragmatic attenuation.  He achieved 7 METS on a Bruce protocol.  He called our office after these results due to persistent chest pain and it was suggested that he follow up with GI.  He was subsequently diagnosed with diverticulitis.  Arthur Moore saw [name], PA-C, for chest pain.  He had previously been seen in the ED with chest pain.  Cardiac enzymes and CT of the chest, abdomen, and pelvis were unremarkable.  His chest pain was atypical and occurred when he was trying to lie down in bed at night.  He was referred for coronary CT-A 04/2017 that revealed a coronary calcium score 361 which is 78th percentile.  He had extensive calcified plaque in the proximal LAD with possible moderate stenosis.  It was sent for FFR which was 0.83.  This does not reach the threshold of obstructive stenosis.  He was also found to have insignificant  stenosis in the left circumflex and RCA.  He was scheduled for shoulder surgery 1/25 but this had to be delayed as the CT-A results were pending.    Since his last appointment Arthur Moore has been doing well.  He has been exercising for 40 minutes 5 days per week.  He has no exertional chest pain or shortness of breath.  He has been feeling quite well.  He visited with a nutritionist and has been following a Mediterranean diet.  He hasn't had any soda in 2 months.  He walks or swims most days of the week.  At his last appointment Arthur Moore was started on Vascepa due to hypertriglyceridemia and CAD. He is tolerating this well.  He has no lower extremity edema, orhtopnea or PND.  He is unsure of whether he wants to get shoulder surgery.  His BP at home has been mostly in the 120s/70s.    Past Medical History:  Diagnosis Date  . Aortic insufficiency   . Arthritis   . Asthma   . Cervical disc disease   . Depression   . Diastolic dysfunction 2/99/3716  . Hyperlipidemia 08/24/2015  . Hypertension   . Lumbar disc disease   . Psoriasis     Past Surgical History:  Procedure Laterality Date  . CHOLECYSTECTOMY  2012  . HERNIA REPAIR    . PAROTIDECTOMY Right 1992  . PROSTATE SURGERY  2002, 2004  . SINOSCOPY    . SINUS EXPLORATION  2013  . SPINE SURGERY  2013   L C7-T1 laminectomy and discectomy  . TONSILLECTOMY       Current Outpatient Medications  Medication Sig Dispense Refill  . albuterol (PROVENTIL HFA;VENTOLIN HFA) 108 (90 Base) MCG/ACT inhaler Inhale 1 puff into the lungs as needed for wheezing or shortness of breath.    Marland Kitchen aspirin EC 81 MG tablet Take 81 mg by mouth daily.    . budesonide-formoterol (SYMBICORT) 160-4.5 MCG/ACT inhaler Inhale 2 puffs into the lungs 2 (two) times daily.    . cetirizine (ZYRTEC) 10 MG tablet Take 10 mg by mouth daily as needed for allergies.    . Cholecalciferol (VITAMIN D3) 5000 units TABS Take 1 tablet by mouth daily.    Vanessa Kick Ethyl  (VASCEPA) 1 g CAPS Take 1 g by mouth 2 (two) times daily. WITH MEALS 120 capsule 3  . lamoTRIgine (LAMICTAL) 200 MG tablet Take 200 mg by mouth daily.    . metoprolol succinate (TOPROL XL) 25 MG 24 hr tablet Take 1 tablet (25 mg total) by mouth daily. 90 tablet 3  . Misc Natural Products (OSTEO BI-FLEX ADV JOINT SHIELD PO) Take 1 tablet by mouth daily.    . pantoprazole (PROTONIX) 40 MG tablet Take 1 tablet (40 mg total) by mouth daily. 90 tablet 1  . Probiotic Product (PROBIOTIC DAILY PO) Take 1 tablet by mouth daily.    . rosuvastatin (CRESTOR) 10 MG tablet TAKE 1 TABLET DAILY 90 tablet 3   No current facility-administered medications for this visit.     Allergies:   Atorvastatin    Social History:  The patient  reports that he has never smoked. He has never used smokeless tobacco. He reports that he does not drink alcohol or use drugs.   Family History:  The patient's family history includes Cancer in his other; Colon cancer in his mother; Lung cancer in his father.    ROS:  Please see the history of present illness.   Otherwise, review of systems are positive for none.   All other systems are reviewed and negative.    PHYSICAL EXAM: VS:  BP 122/66 (BP Location: Left Arm, Patient Position: Sitting, Cuff Size: Normal)   Pulse 70   Ht 5\' 11"  (1.803 m)   Wt 177 lb (80.3 kg)   BMI 24.69 kg/m  , BMI Body mass index is 24.69 kg/m. GENERAL:  Well appearing HEENT: Pupils equal round and reactive, fundi not visualized, oral mucosa unremarkable NECK:  No jugular venous distention, waveform within normal limits, carotid upstroke brisk and symmetric, no bruits, no thyromegaly LYMPHATICS:  No cervical adenopathy LUNGS:  Clear to auscultation bilaterally HEART:  RRR.  PMI not displaced or sustained,S1 and S2 within normal limits, no S3, no S4, no clicks, no rubs, no murmurs ABD:  Flat, positive bowel sounds normal in frequency in pitch, no bruits, no rebound, no guarding, no midline  pulsatile mass, no hepatomegaly, no splenomegaly EXT:  2 plus pulses throughout, no edema, no cyanosis no clubbing SKIN:  No rashes no nodules NEURO:  Cranial nerves II through XII grossly intact, motor grossly intact throughout PSYCH:  Cognitively intact, oriented to person place and time   EKG:  EKG is not ordered today. 06/20/16: Sinus rhythm. Rate 64 bpm.  Echo 04/25/14: LVEF 60%.  Aortic valve sclerosis without stenosis. Mild aortic insufficiency.  ETT 04/2013:  Exercise 8 minutes and 44 seconds on a Bruce protocol. Peak heart rate 136. 9.8 METS. No ischemic changes.  24 Hour Holter: Underlying rhythm is  sinus. Average heart rate 74, minimum 52, max 123. Rare PACs and PVCs. No significant arrhythmia noted.  Echo 12/05/15: Study Conclusions  - Left ventricle: Global LV longitudinal strain is normal at -16.2%   There is a false tendon in the LV apex of no clinical   significance. The cavity size was normal. There was moderate   focal basal hypertrophy. Systolic function was normal. The   estimated ejection fraction was in the range of 55% to 60%. Wall   motion was normal; there were no regional wall motion   abnormalities. Features are consistent with a pseudonormal left   ventricular filling pattern, with concomitant abnormal relaxation   and increased filling pressure (grade 2 diastolic dysfunction). - Aortic valve: Trileaflet; mildly thickened leaflets. There was   mild to moderate regurgitation directed eccentrically in the LVOT   and towards the mitral anterior leaflet. - Mitral valve: There was mild regurgitation. - Pulmonic valve: There was mild regurgitation.   Exercise Myoview 06/26/16: Nuclear stress EF: 49%. Asynchronous contraction.  The left ventricular ejection fraction is mildly decreased (45-54%).  There was no ST segment deviation noted during stress.  Defect 1: There is a small defect of mild severity present in the basal inferior location. Likely  diaphragmatic attenuation artifact.  This is a low risk study. No ischemia identified  Coronary CT-A 05/15/17: IMPRESSION: 1. Coronary artery calcium score 361 Agatston units, placing the patient in the 78th percentile for age and gender, suggesting intermediate to high risk for future cardiac events.  2. Extensive calcified plaque in the proximal LAD, possible moderate stenosis though blooming artifact may make the disease look worse than it actually is.  FFR 0.83 in mid LAD. I do no think there is hemodynamically significant disease in the LAD. The LCx and RCA have no hemodynamically significant disease.  Recent Labs: 05/09/2017: Hemoglobin 14.4; Platelets 95 08/04/2017: ALT 57; BUN 21; Creatinine, Ser 1.24; Potassium 4.1; Sodium 144    Lipid Panel    Component Value Date/Time   CHOL 146 08/04/2017 1105   TRIG 160 (H) 08/04/2017 1105   HDL 37 (L) 08/04/2017 1105   CHOLHDL 3.9 08/04/2017 1105   LDLCALC 77 08/04/2017 1105     11/2014: Total cholesterol 130, HDL 39, LDL 65, triglycerides 116 Total chol 133, tri 211, ldl 55, hdl 34  Wt Readings from Last 3 Encounters:  08/07/17 177 lb (80.3 kg)  06/11/17 189 lb 1.6 oz (85.8 kg)  05/28/17 193 lb (87.5 kg)      ASSESSMENT AND PLAN:  # Moderate, non-obstructive CAD:  # Hyperlipidemia:  Coronary calcium score was 78th percentile and he had moderate, non-obstructive CAD.  He is doing well and has no angina.  He was congratulated on his diet and exercise efforts.  LDL is above goal and his LFTs remain elevated on lower dose statin.  We will stop rosuvastatin.  He has not tolerated atorvastatin in the past. Continue Vascepa and refer to [facility] for PCSK9 inhibitor.   Goal LDL is <70.  He will continue to see his gastroenterologist regarding his liver fibrosis and elevated LFTs.  Continue aspirin and metoprolol.  # Atypical chest pain: Resolved.     # Aortic regurgitation: Stable on echo 11/2015. Repeat echo.  # Diastolic  dysfunction: Grade 2 on echo.  No symptoms of heart failure.  BP remains controlled.    # Hypertension: BP well-controlled. Continue metoprolol.   # TIA: Continue statin and aspirin 81 mg daily.  # Depression: #  Anxiety:   Symptoms improving.  Continue exercise..    Current medicines are reviewed at length with the patient today.  The patient does not have concerns regarding medicines.  The following changes have been made: none  Labs/ tests ordered today include:   No orders of the defined types were placed in this encounter.   Disposition:   FU with Caliana Spires C. Oval Linsey, MD in 6 months.     Signed, Arthur Latch, MD  08/07/2017 10:49 AM    [place]

## 2017-08-07 NOTE — Patient Instructions (Addendum)
Medication Instructions:  Stop: Crestor 10 mg    Testing/Procedures: Your physician has requested that you have an echocardiogram. Echocardiography is a painless test that uses sound waves to create images of your heart. It provides your doctor with information about the size and shape of your heart and how well your heart's chambers and valves are working. This procedure takes approximately one hour. There are no restrictions for this procedure.  [place]: Your physician recommends that you schedule a follow-up appointment in: Pharmacists as soon as possible for lipid check  Your physician wants you to follow-up in: 6 month with Dr. Pat Kocher will receive a reminder letter in the mail two months in advance. If you don't receive a letter, please call our office to schedule the follow-up appointment.     Any Other Special Instructions Will Be Listed Below (If Applicable). Repatha  Praluent    If you need a refill on your cardiac medications before your next appointment, please call your pharmacy.

## 2017-08-09 ENCOUNTER — Encounter: Payer: Self-pay | Admitting: Cardiovascular Disease

## 2017-08-11 ENCOUNTER — Ambulatory Visit (HOSPITAL_COMMUNITY): Payer: POS

## 2017-08-17 ENCOUNTER — Ambulatory Visit (INDEPENDENT_AMBULATORY_CARE_PROVIDER_SITE_OTHER): Payer: 59 | Admitting: *Deleted

## 2017-08-17 DIAGNOSIS — J309 Allergic rhinitis, unspecified: Secondary | ICD-10-CM | POA: Diagnosis not present

## 2017-08-25 ENCOUNTER — Ambulatory Visit (INDEPENDENT_AMBULATORY_CARE_PROVIDER_SITE_OTHER): Payer: POS | Admitting: Pharmacist

## 2017-08-25 DIAGNOSIS — E7800 Pure hypercholesterolemia, unspecified: Secondary | ICD-10-CM

## 2017-08-25 NOTE — Progress Notes (Signed)
Patient ID: Arthur Moore                 DOB: 1951-03-11                    MRN: [id number]     HPI: Arthur Moore is a 67 y.o. male patient referred to lipid clinic by Dr Oval Linsey. PMH is significant for hypertension, asthma, hx of TIA, liver fibrosis, anxiety, panic attacks, atypical chest pain, CAD with coronary calcium score 361. Patient reports intolerance to atorvastatin in the past and unable to reach goal LDL while on rosuvastatin 10mg  daily. Noted LFT less than 2x normal limit and statins not contraindicated.  Patient presents to clinic accompany by his wife. He is reluctant to see the pharmacist for lipid management. "Don't understand why he needs to see the pharmacist to get a price on a drug". [name] refuses counseling on medication indication or administration. Stated he "will get a cardiologist at the [place] and get the medication from them".  Current Medications: none  Intolerances:  Atorvastatin - pain on legs  LDL goal: <70 mg/dL  Diet: Mediterranean diet  Labs: 08/05/17: CHO 146; TG 160; HDL 37; LDL-c 77 (rosuvastatin 10mg  daily??)  Past Medical History:  Diagnosis Date  . Aortic insufficiency   . Arthritis   . Asthma   . Cervical disc disease   . Depression   . Diastolic dysfunction 12/13/5629  . Hyperlipidemia 08/24/2015  . Hypertension   . Lumbar disc disease   . Psoriasis     Current Outpatient Medications on File Prior to Visit  Medication Sig Dispense Refill  . albuterol (PROVENTIL HFA;VENTOLIN HFA) 108 (90 Base) MCG/ACT inhaler Inhale 1 puff into the lungs as needed for wheezing or shortness of breath.    Marland Kitchen aspirin EC 81 MG tablet Take 81 mg by mouth daily.    . budesonide-formoterol (SYMBICORT) 160-4.5 MCG/ACT inhaler Inhale 2 puffs into the lungs 2 (two) times daily.    . cetirizine (ZYRTEC) 10 MG tablet Take 10 mg by mouth daily as needed for allergies.    . Cholecalciferol (VITAMIN D3) 5000 units TABS Take 1 tablet by mouth daily.    Vanessa Kick Ethyl (VASCEPA) 1 g CAPS Take 1 g by mouth 2 (two) times daily. WITH MEALS 120 capsule 3  . lamoTRIgine (LAMICTAL) 200 MG tablet Take 200 mg by mouth daily.    . metoprolol succinate (TOPROL XL) 25 MG 24 hr tablet Take 1 tablet (25 mg total) by mouth daily. 90 tablet 3  . Misc Natural Products (OSTEO BI-FLEX ADV JOINT SHIELD PO) Take 1 tablet by mouth daily.    . pantoprazole (PROTONIX) 40 MG tablet Take 1 tablet (40 mg total) by mouth daily. 90 tablet 1  . Probiotic Product (PROBIOTIC DAILY PO) Take 1 tablet by mouth daily.     No current facility-administered medications on file prior to visit.     Allergies  Allergen Reactions  . Atorvastatin Other (See Comments)    Calf pain.    Hyperlipidemia Patient refuses counseling and was very upset because I could not give him an exact price on his medication today. He may be a better candidate to low dose rosuvastatin plus ezetimibe instead of PCSK9 inhibitors, but he was not willing/receptive to complete an OV with [place] today, nor to pay for this visit.    Medical records was contacted for patient to request copy of cardiologist notes to be send to [facility]. His plan  is to contact cardiology services at the [facility] to get PCSK9i prescription from them due to cost. No further evaluation or questions were covered during this visit.   Rollyn Scialdone Rodriguez-Guzman PharmD, BCPS, [place] [address] 08/26/2017 8:24 AM

## 2017-08-26 ENCOUNTER — Encounter: Payer: Self-pay | Admitting: Pharmacist

## 2017-08-26 NOTE — Assessment & Plan Note (Signed)
Patient refuses counseling and was very upset because I could not give him an exact price on his medication today. He may be a better candidate to low dose rosuvastatin plus ezetimibe instead of PCSK9 inhibitors, but he was not willing/receptive to complete an OV with [place] today, nor to pay for this visit.    Medical records was contacted for patient to request copy of cardiologist notes to be send to [facility]. His plan is to contact cardiology services at the [facility] to get PCSK9i prescription from them due to cost. No further evaluation or questions were covered during this visit.

## 2017-08-28 ENCOUNTER — Ambulatory Visit (INDEPENDENT_AMBULATORY_CARE_PROVIDER_SITE_OTHER): Payer: POS

## 2017-08-28 ENCOUNTER — Other Ambulatory Visit: Payer: Self-pay

## 2017-08-28 DIAGNOSIS — I351 Nonrheumatic aortic (valve) insufficiency: Secondary | ICD-10-CM

## 2017-09-03 DIAGNOSIS — J3089 Other allergic rhinitis: Secondary | ICD-10-CM | POA: Diagnosis not present

## 2017-09-03 NOTE — Progress Notes (Signed)
VIALS EXP 09-05-18 

## 2017-09-18 ENCOUNTER — Ambulatory Visit (INDEPENDENT_AMBULATORY_CARE_PROVIDER_SITE_OTHER): Payer: 59

## 2017-09-18 DIAGNOSIS — J309 Allergic rhinitis, unspecified: Secondary | ICD-10-CM | POA: Diagnosis not present

## 2017-09-21 ENCOUNTER — Encounter: Payer: Self-pay | Admitting: Cardiovascular Disease

## 2017-09-22 ENCOUNTER — Encounter: Payer: Self-pay | Admitting: Cardiovascular Disease

## 2017-09-22 ENCOUNTER — Ambulatory Visit: Payer: Self-pay | Admitting: Cardiovascular Disease

## 2017-09-25 ENCOUNTER — Other Ambulatory Visit: Payer: Self-pay | Admitting: Gastroenterology

## 2017-09-25 DIAGNOSIS — R1084 Generalized abdominal pain: Secondary | ICD-10-CM

## 2017-09-25 DIAGNOSIS — K5792 Diverticulitis of intestine, part unspecified, without perforation or abscess without bleeding: Secondary | ICD-10-CM

## 2017-09-28 ENCOUNTER — Other Ambulatory Visit: Payer: Self-pay

## 2017-09-28 ENCOUNTER — Ambulatory Visit
Admission: RE | Admit: 2017-09-28 | Discharge: 2017-09-28 | Disposition: A | Discharge status: Home / Self Care | Payer: POS | Source: Ambulatory Visit | Attending: Gastroenterology | Admitting: Gastroenterology

## 2017-09-28 DIAGNOSIS — R1084 Generalized abdominal pain: Secondary | ICD-10-CM

## 2017-09-28 DIAGNOSIS — K5792 Diverticulitis of intestine, part unspecified, without perforation or abscess without bleeding: Secondary | ICD-10-CM

## 2017-09-28 MED ORDER — IOPAMIDOL (ISOVUE-300) INJECTION 61%
100.0000 mL | Freq: Once | INTRAVENOUS | Status: AC | PRN
  Administered 2017-09-28: 100 mL via INTRAVENOUS

## 2017-10-05 ENCOUNTER — Telehealth: Payer: Self-pay | Admitting: Cardiovascular Disease

## 2017-10-05 ENCOUNTER — Encounter: Payer: Self-pay | Admitting: Cardiovascular Disease

## 2017-10-05 ENCOUNTER — Telehealth: Payer: Self-pay | Admitting: *Deleted

## 2017-10-05 DIAGNOSIS — R945 Abnormal results of liver function studies: Secondary | ICD-10-CM

## 2017-10-05 DIAGNOSIS — Z5181 Encounter for therapeutic drug level monitoring: Secondary | ICD-10-CM

## 2017-10-05 DIAGNOSIS — R7989 Other specified abnormal findings of blood chemistry: Secondary | ICD-10-CM

## 2017-10-05 DIAGNOSIS — E7800 Pure hypercholesterolemia, unspecified: Secondary | ICD-10-CM

## 2017-10-05 NOTE — Telephone Encounter (Signed)
Spoke with pt, he is not able to get help through the [place] because they do not offer this drug. He was also not able to qualify with the drug company or another non-profit program. He questions if he should go back to the previous statin. Will forward to the pharm md to help.

## 2017-10-05 NOTE — Telephone Encounter (Signed)
  Spoke with Arthur Moore Pharm D about patients mychart message,  she suggested Praulent since that was a covered PSK9 previously. Spoke with patient and he stated he talked with the cardiology department at [facility] and none of these are covered by [facility] anymore. He would like to know what Dr Oval Linsey recommends. He is willing to retry statin. Advised patient would discuss with Dr Oval Linsey when she is back in the office next week.    Dr. Oval Linsey: I sent you a message now almost a month ago and haven't received a response. Here is the latest in my attempt to get Rapatha to lower my LDL. I contacted my insurance company and they told me Rapatha and the other drug you recommended would cost me about $300 for a 90 day supply which we cannot afford. I contacted the [place] and they do not offer Rapatha or the alternate drug you suggested. I further contacted the makers of Rapatha and I do not qualify for their $5.00 copay, They referred me to two other organizations one being, [place] the other being [employer] and I do not qualify for their programs either. I don't know what my other options are except to go back on my statin and hope for the best? or? Please advise. Thank You.

## 2017-10-05 NOTE — Telephone Encounter (Signed)
New Message:   Pt said he went to his insurance company,it will cost him $300,which he can not afford. He call  Repatha,they said he did not qualify to get  It,he also tried two other programs and he was denied. Does she want to him take one of the statin? If so,please call the prescription in for him please.

## 2017-10-05 NOTE — Telephone Encounter (Signed)
Rosuvastatin 10mg  daily was discontinued due to elevated LFTs.  Okay to re-challenge with rosuvsatatin 5mg  daily but will need LFTs check in 30 days.   PCSK9i still the best option for this patient but will try low dose statin due to issues acquiring medication.

## 2017-10-06 ENCOUNTER — Other Ambulatory Visit: Payer: Self-pay

## 2017-10-06 NOTE — Telephone Encounter (Signed)
Agree with trying low dose statin given that PCSK9 inhibitor is not an option.

## 2017-10-07 MED ORDER — ROSUVASTATIN CALCIUM 5 MG PO TABS: 5.0000 mg | ORAL_TABLET | Freq: Every day | ORAL | 3 refills | Status: DC

## 2017-10-07 NOTE — Telephone Encounter (Signed)
Earvin Hansen, LPN at 8/32/9191 6:60 PM   Status: Signed    Advised patient verbalized understanding     Rodriguez-Guzman, Raquel, RPH at 10/05/2017 2:09 PM   Status: Signed    Rosuvastatin 10mg  daily was discontinued due to elevated LFTs.  Okay to re-challenge with rosuvsatatin 5mg  daily but will need LFTs check in 30 days.   PCSK9i still the best option for this patient but will try low dose statin due to issues acquiring medication.

## 2017-10-07 NOTE — Telephone Encounter (Signed)
Advised patient verbalized understanding  

## 2017-10-13 ENCOUNTER — Ambulatory Visit (INDEPENDENT_AMBULATORY_CARE_PROVIDER_SITE_OTHER): Payer: 59 | Admitting: *Deleted

## 2017-10-13 DIAGNOSIS — J309 Allergic rhinitis, unspecified: Secondary | ICD-10-CM | POA: Diagnosis not present

## 2017-10-13 NOTE — Telephone Encounter (Signed)
Discussed with Raquel Pharm D and will recheck LFT's 1 month and LP/CMET in 3 months. Mailed orders to patient

## 2017-10-28 ENCOUNTER — Encounter: Payer: Self-pay | Admitting: *Deleted

## 2017-10-28 NOTE — Progress Notes (Signed)
ERROR

## 2017-10-30 ENCOUNTER — Telehealth: Payer: Self-pay | Admitting: Cardiovascular Disease

## 2017-10-30 DIAGNOSIS — M79602 Pain in left arm: Secondary | ICD-10-CM | POA: Diagnosis not present

## 2017-10-30 NOTE — Telephone Encounter (Signed)
Spoke with pt, he is under a lot of stress and about 40 minutes ago he developed an aching feeling in his left forearm. His bp is 171/97 in the right and 167/85 on the left. He has taken an extra 50 mg of metoprolol tartrate and chewed 4 baby aspirin. There is no change in aching with movement or palpitation. His wife is going to take him to the urgent care to be checked out.

## 2017-10-30 NOTE — Telephone Encounter (Signed)
New Message:      Pt states he has aching in his left forearm. Pt states he has taken his BP and it is 171/97 and has also taken some additional medicine.

## 2017-11-06 ENCOUNTER — Encounter: Payer: Self-pay | Admitting: *Deleted
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
## 2017-11-09 ENCOUNTER — Ambulatory Visit (INDEPENDENT_AMBULATORY_CARE_PROVIDER_SITE_OTHER): Payer: Medicare Other | Admitting: *Deleted

## 2017-11-09 DIAGNOSIS — J309 Allergic rhinitis, unspecified: Secondary | ICD-10-CM | POA: Diagnosis not present

## 2017-11-17 ENCOUNTER — Encounter: Payer: Self-pay | Admitting: Cardiovascular Disease

## 2017-11-25 DIAGNOSIS — M47812 Spondylosis without myelopathy or radiculopathy, cervical region: Secondary | ICD-10-CM | POA: Diagnosis not present

## 2017-11-26 ENCOUNTER — Encounter: Payer: Self-pay | Admitting: Cardiovascular Disease

## 2017-11-29 ENCOUNTER — Other Ambulatory Visit: Payer: Self-pay | Admitting: Cardiovascular Disease

## 2017-12-03 ENCOUNTER — Other Ambulatory Visit (HOSPITAL_COMMUNITY): Payer: 59

## 2017-12-07 ENCOUNTER — Ambulatory Visit (INDEPENDENT_AMBULATORY_CARE_PROVIDER_SITE_OTHER): Payer: Medicare Other

## 2017-12-07 DIAGNOSIS — M5136 Other intervertebral disc degeneration, lumbar region: Secondary | ICD-10-CM | POA: Diagnosis not present

## 2017-12-07 DIAGNOSIS — M47812 Spondylosis without myelopathy or radiculopathy, cervical region: Secondary | ICD-10-CM | POA: Diagnosis not present

## 2017-12-07 DIAGNOSIS — M47817 Spondylosis without myelopathy or radiculopathy, lumbosacral region: Secondary | ICD-10-CM | POA: Diagnosis not present

## 2017-12-07 DIAGNOSIS — G894 Chronic pain syndrome: Secondary | ICD-10-CM | POA: Diagnosis not present

## 2017-12-07 DIAGNOSIS — J309 Allergic rhinitis, unspecified: Secondary | ICD-10-CM

## 2017-12-14 DIAGNOSIS — M47812 Spondylosis without myelopathy or radiculopathy, cervical region: Secondary | ICD-10-CM | POA: Diagnosis not present

## 2017-12-16 ENCOUNTER — Ambulatory Visit (INDEPENDENT_AMBULATORY_CARE_PROVIDER_SITE_OTHER): Payer: Medicare Other | Admitting: *Deleted

## 2017-12-16 DIAGNOSIS — J309 Allergic rhinitis, unspecified: Secondary | ICD-10-CM

## 2017-12-21 ENCOUNTER — Ambulatory Visit (INDEPENDENT_AMBULATORY_CARE_PROVIDER_SITE_OTHER): Payer: Medicare Other | Admitting: *Deleted

## 2017-12-21 DIAGNOSIS — J309 Allergic rhinitis, unspecified: Secondary | ICD-10-CM

## 2017-12-23 DIAGNOSIS — Z5181 Encounter for therapeutic drug level monitoring: Secondary | ICD-10-CM | POA: Diagnosis not present

## 2017-12-23 DIAGNOSIS — R945 Abnormal results of liver function studies: Secondary | ICD-10-CM | POA: Diagnosis not present

## 2017-12-23 DIAGNOSIS — E7800 Pure hypercholesterolemia, unspecified: Secondary | ICD-10-CM | POA: Diagnosis not present

## 2017-12-24 LAB — HEPATIC FUNCTION PANEL
ALT: 35 IU/L (ref 0–44)
AST: 36 IU/L (ref 0–40)
Albumin: 4.8 g/dL (ref 3.6–4.8)
Alkaline Phosphatase: 101 IU/L (ref 39–117)
Bilirubin Total: 1.3 mg/dL — ABNORMAL HIGH (ref 0.0–1.2)
Bilirubin, Direct: 0.3 mg/dL (ref 0.00–0.40)
Total Protein: 6.7 g/dL (ref 6.0–8.5)

## 2017-12-24 LAB — LIPID PANEL
Chol/HDL Ratio: 3.7 ratio (ref 0.0–5.0)
Cholesterol, Total: 131 mg/dL (ref 100–199)
HDL: 35 mg/dL — ABNORMAL LOW (ref >39)
LDL Calculated: 74 mg/dL (ref 0–99)
Triglycerides: 110 mg/dL (ref 0–149)
VLDL Cholesterol Cal: 22 mg/dL (ref 5–40)

## 2017-12-29 ENCOUNTER — Ambulatory Visit (INDEPENDENT_AMBULATORY_CARE_PROVIDER_SITE_OTHER): Payer: Medicare Other

## 2017-12-29 ENCOUNTER — Ambulatory Visit (INDEPENDENT_AMBULATORY_CARE_PROVIDER_SITE_OTHER): Payer: Medicare Other | Admitting: Cardiovascular Disease

## 2017-12-29 ENCOUNTER — Encounter: Payer: Self-pay | Admitting: Cardiovascular Disease

## 2017-12-29 ENCOUNTER — Encounter

## 2017-12-29 VITALS — BP 118/64 | HR 61

## 2017-12-29 DIAGNOSIS — I10 Essential (primary) hypertension: Secondary | ICD-10-CM | POA: Diagnosis not present

## 2017-12-29 DIAGNOSIS — E7800 Pure hypercholesterolemia, unspecified: Secondary | ICD-10-CM

## 2017-12-29 DIAGNOSIS — I2510 Atherosclerotic heart disease of native coronary artery without angina pectoris: Secondary | ICD-10-CM | POA: Diagnosis not present

## 2017-12-29 DIAGNOSIS — I351 Nonrheumatic aortic (valve) insufficiency: Secondary | ICD-10-CM | POA: Diagnosis not present

## 2017-12-29 DIAGNOSIS — J309 Allergic rhinitis, unspecified: Secondary | ICD-10-CM

## 2017-12-29 MED ORDER — METOPROLOL TARTRATE 25 MG PO TABS: ORAL_TABLET | ORAL | 1 refills | Status: DC

## 2017-12-29 NOTE — Patient Instructions (Addendum)
Medication Instructions:  STOP METOPROLOL SUC  START METOPROLOL TART 25 MG 1/2 TWICE A DAY   STOP VASCEPA   Labwork: FATING LIP/CMET IN 6 MONTHS FEW DAYS PRIOR TO FOLLOW UP APPOINTMENT   Testing/Procedures: NONE  Follow-Up: Your physician wants you to follow-up in: [place] will receive a reminder letter in the mail two months in advance. If you don't receive a letter, please call our office to schedule the follow-up appointment.  Any Other Special Instructions Will Be Listed Below (If Applicable).     If you need a refill on your cardiac medications before your next appointment, please call your pharmacy.

## 2017-12-29 NOTE — Progress Notes (Signed)
Cardiology Office Note   Date:  12/29/2017  ID:  [facility] Moore, DOB [birth year], MRN [id number]  PCP:  Leighton Ruff, MD  Cardiologist:   Skeet Latch, MD   No chief complaint on file.   History of Present Illness: [facility] is a 67 y.o. male with coronary artery calcification, mild-moderate aortic insufficiency, hypertension, asthma,TIA, OSA not on CPAP, liver fibrosis, anxiety, and depression who presents for follow up.  [facility] Moore wore a Holter monitor 06/2013 that revealed rare PACs and PVCs.  He previously followed up with a cardiologist in [place] due to mild AR.  He subsequently had an echo at [facility] [place] in 2015 that reported it as mild-moderate.  He had a repeat echocardiogram 12/05/15 revealed LVEF 55-60% with grade 2 diastolic. Aortic regurgitation remained mild to moderate.  [facility] Moore reported fatigue and atypical chest pain.  He was referred for an exercise Myoview 3/18 that revealed LVEF 49% and asynchronous contraction. There was a small defect in [facility] basal inferior region that was thought to be diaphragmatic attenuation.  He achieved 7 METS on a Bruce protocol.  He called our office after these results due to persistent chest pain and it was suggested that he follow up with GI.  He was subsequently diagnosed with diverticulitis.  [facility] Moore saw [name], PA-C, for chest pain.  He had previously been seen in [facility] ED with chest pain.  Cardiac enzymes and CT of [facility] chest, abdomen, and pelvis were unremarkable.  His chest pain was atypical and occurred when he was trying to lie down in bed at night.  He was referred for coronary CT-A 04/2017 that revealed a coronary calcium score 361 which is 78th percentile.  He had extensive calcified plaque in [facility] proximal LAD with possible moderate stenosis.  It was sent for FFR which was 0.83.  This does not reach Arthur threshold of obstructive stenosis.  He was also found to have insignificant stenosis in [facility] left  circumflex and RCA.  He was scheduled for shoulder surgery 1/25 but this had to be delayed as [facility] CT-A results were pending.  Repeat echo 08/2017 revealed LVEF 55 to 65% with grade 1 diastolic dysfunction.  At his last appointment [facility] was started on Vascepa for hypertriglyceridemia and CAD.  He is doing very well on it and had significant improvement in his lipids.  He tried to get it filled through [facility] [place] and was denied.  Metoprolol succinate was also denied as were PCSK9 inhibitors.  He is very frustrated by this.  He is been working very hard on diet and exercise.  He is been able to lose over 30 pounds.  He is also been getting treatment for his anxiety.  He was started on lamotrigine and has noted a significant improvement in his mood.  He also takes lorazepam as needed for sleep.  He was having panic attacks but these have improved with this regimen.  He has no exertional chest pain or shortness of breath.  He denies lower extremity edema, orthopnea, or PND.   Past Medical History:  Diagnosis Date  . Aortic insufficiency   . Arthritis   . Asthma   . Cervical disc disease   . Depression   . Diastolic dysfunction 7/86/7672  . Hyperlipidemia 08/24/2015  . Hypertension   . Lumbar disc disease   . Psoriasis     Past Surgical History:  Procedure Laterality Date  . CHOLECYSTECTOMY  2012  . HERNIA REPAIR    .  PAROTIDECTOMY Right 1992  . PROSTATE SURGERY  2002, 2004  . SINOSCOPY    . SINUS EXPLORATION  2013  . SPINE SURGERY  2013   L C7-T1 laminectomy and discectomy  . TONSILLECTOMY       Current Outpatient Medications  Medication Sig Dispense Refill  . albuterol (PROVENTIL HFA;VENTOLIN HFA) 108 (90 Base) MCG/ACT inhaler Inhale 1 puff into [facility] lungs as needed for wheezing or shortness of breath.    Marland Kitchen aspirin EC 81 MG tablet Take 81 mg by mouth daily.    . budesonide-formoterol (SYMBICORT) 160-4.5 MCG/ACT inhaler Inhale 2 puffs into [facility] lungs 2 (two) times daily.    .  cetirizine (ZYRTEC) 10 MG tablet Take 10 mg by mouth daily as needed for allergies.    . Cholecalciferol (VITAMIN D3) 5000 units TABS Take 1 tablet by mouth daily.    . Cranberry 1000 MG CAPS Take by mouth.    . lamoTRIgine (LAMICTAL) 200 MG tablet Take 200 mg by mouth daily.    . Misc Natural Products (OSTEO BI-FLEX ADV JOINT SHIELD PO) Take 1 tablet by mouth daily.    . Multiple Vitamin (MULTIVITAMIN) capsule Take 1 capsule by mouth daily.    . pantoprazole (PROTONIX) 40 MG tablet Take 1 tablet (40 mg total) by mouth daily. 90 tablet 1  . Probiotic Product (PROBIOTIC DAILY PO) Take 1 tablet by mouth daily.    . rosuvastatin (CRESTOR) 5 MG tablet Take 1 tablet (5 mg total) by mouth daily. 90 tablet 3  . metoprolol tartrate (LOPRESSOR) 25 MG tablet TAKE 1/2 TABLET BY MOUTH TWICE A DAY 180 tablet 1   No current facility-administered medications for this visit.     Allergies:   Atorvastatin    Social History:  [facility]  reports that he has never smoked. He has never used smokeless tobacco. He reports that he does not drink alcohol or use drugs.   Family History:  [facility] Moore's family history includes Cancer in his other; Colon cancer in his mother; Lung cancer in his father.    ROS:  Please see [facility] history of present illness.   Otherwise, review of systems are positive for none.   All other systems are reviewed and negative.    PHYSICAL EXAM: VS:  BP 118/64 (BP Location: Right Arm)   Pulse 61  , BMI There is no height or weight on file to calculate BMI. GENERAL:  Well appearing HEENT: Pupils equal round and reactive, fundi not visualized, oral mucosa unremarkable NECK:  No jugular venous distention, waveform within normal limits, carotid upstroke brisk and symmetric, no bruits LUNGS:  Clear to auscultation bilaterally HEART:  RRR.  PMI not displaced or sustained,S1 and S2 within normal limits, no S3, no S4, no clicks, no rubs, no murmurs ABD:  Flat, positive bowel sounds normal in  frequency in pitch, no bruits, no rebound, no guarding, no midline pulsatile mass, no hepatomegaly, no splenomegaly EXT:  2 plus pulses throughout, no edema, no cyanosis no clubbing SKIN:  No rashes no nodules NEURO:  Cranial nerves II through XII grossly intact, motor grossly intact throughout PSYCH:  Cognitively intact, oriented to person place and time   EKG:  EKG is ordered today. 06/20/16: Sinus rhythm. Rate 64 bpm. 12/29/17: Sinus rhythm.  Rate 61 bpm.  Echo 04/25/14: LVEF 60%.  Aortic valve sclerosis without stenosis. Mild aortic insufficiency.  ETT 04/2013:  Exercise 8 minutes and 44 seconds on a Bruce protocol. Peak heart rate 136. 9.8 METS. No  ischemic changes.  24 Hour Holter: Underlying rhythm is sinus. Average heart rate 74, minimum 52, max 123. Rare PACs and PVCs. No significant arrhythmia noted.  Echo 12/05/15: Study Conclusions  - Left ventricle: Global LV longitudinal strain is normal at -16.2%   There is a false tendon in [facility] LV apex of no clinical   significance. [facility] cavity size was normal. There was moderate   focal basal hypertrophy. Systolic function was normal. Arthur   estimated ejection fraction was in Arthur range of 55% to 60%. Wall   motion was normal; there were no regional wall motion   abnormalities. Features are consistent with a pseudonormal left   ventricular filling pattern, with concomitant abnormal relaxation   and increased filling pressure (grade 2 diastolic dysfunction). - Aortic valve: Trileaflet; mildly thickened leaflets. There was   mild to moderate regurgitation directed eccentrically in [facility] LVOT   and towards [facility] mitral anterior leaflet. - Mitral valve: There was mild regurgitation. - Pulmonic valve: There was mild regurgitation.  Echo 08/28/17: Study Conclusions  - Left ventricle: Arthur cavity size was normal. There was mild   concentric hypertrophy. Systolic function was normal. Arthur   estimated ejection fraction was in Arthur range of 55% to  65%. Wall   motion was normal; there were no regional wall motion   abnormalities. Doppler parameters are consistent with abnormal   left ventricular relaxation (grade 1 diastolic dysfunction). - Aortic valve: There was mild to moderate regurgitation directed   eccentrically to [facility] anterior leaflet of Arthur mitral valve. - Left atrium: [facility] atrium was normal in size. - Right ventricle: Systolic function was normal. - Pulmonary arteries: Systolic pressure was within Arthur normal   range.  Exercise Myoview 06/26/16: Nuclear stress EF: 49%. Asynchronous contraction.  [facility] left ventricular ejection fraction is mildly decreased (45-54%).  There was no ST segment deviation noted during stress.  Defect 1: There is a small defect of mild severity present in [facility] basal inferior location. Likely diaphragmatic attenuation artifact.  This is a low risk study. No ischemia identified  Coronary CT-A 05/15/17: IMPRESSION: 1. Coronary artery calcium score 361 Agatston units, placing Arthur Moore in [facility] 78th percentile for age and gender, suggesting intermediate to high risk for future cardiac events.  2. Extensive calcified plaque in [facility] proximal LAD, possible moderate stenosis though blooming artifact may make Arthur disease look worse than it actually is.  FFR 0.83 in mid LAD. I do no think there is hemodynamically significant disease in Arthur LAD. Arthur LCx and RCA have no hemodynamically significant disease.  Echo 08/2017: Study Conclusions  - Left ventricle: [facility] cavity size was normal. There was mild   concentric hypertrophy. Systolic function was normal. Arthur   estimated ejection fraction was in Arthur range of 55% to 65%. Wall   motion was normal; there were no regional wall motion   abnormalities. Doppler parameters are consistent with abnormal   left ventricular relaxation (grade 1 diastolic dysfunction). - Aortic valve: There was mild to moderate regurgitation directed   eccentrically to Arthur  anterior leaflet of [facility] mitral valve. - Left atrium: [facility] atrium was normal in size. - Right ventricle: Systolic function was normal. - Pulmonary arteries: Systolic pressure was within Arthur normal   range.   Recent Labs: 05/09/2017: Hemoglobin 14.4; Platelets 95 08/04/2017: BUN 21; Creatinine, Ser 1.24; Potassium 4.1; Sodium 144 12/23/2017: ALT 35    Lipid Panel    Component Value Date/Time   CHOL 131 12/23/2017 1222   TRIG  110 12/23/2017 1222   HDL 35 (L) 12/23/2017 1222   CHOLHDL 3.7 12/23/2017 1222   LDLCALC 74 12/23/2017 1222     11/2014: Total cholesterol 130, HDL 39, LDL 65, triglycerides 116 Total chol 133, tri 211, ldl 55, hdl 34  Wt Readings from Last 3 Encounters:  08/07/17 177 lb (80.3 kg)  06/11/17 189 lb 1.6 oz (85.8 kg)  05/28/17 193 lb (87.5 kg)      ASSESSMENT AND PLAN:  # Moderate, non-obstructive CAD:  # Hyperlipidemia:  Coronary calcium score was 78th percentile and he had moderate, non-obstructive CAD.  He is doing well and has no angina.  He was congratulated on his diet and exercise efforts. LDL is much better.  He is now tolerating rosuvastatin 10 mg nightly.  LFTs were stable.  Given that he is now able to tolerate a statin he does not need a PCSK9 inhibitor.  His diet, exercise, and weight loss have also contributed to his improved lipid control.  Continue rosuvastatin.  We will plan to repeat his lipids and CMP in 6 months.  # Atypical chest pain: Resolved.     # Aortic regurgitation: Stable on echo 11/2015 and 08/2017.  # Diastolic dysfunction: Grade 2 on echo improved to grade 1 diastolic dysfunction on repeat..  No symptoms of heart failure.  BP remains controlled.    # Hypertension: BP well-controlled. Continue metoprolol.   # TIA: Continue statin and aspirin 81 mg daily.  # Depression: # Anxiety:   Symptoms improving with new therapy.  His mood is much better.  Continue exercise.   Current medicines are reviewed at length with [facility] Moore  today.  [facility] Moore does not have concerns regarding medicines.  [facility] following changes have been made: none  Labs/ tests ordered today include:   Orders Placed This Encounter  Procedures  . EKG 12-Lead    Disposition:   FU with Latondra Gebhart C. Oval Linsey, MD in 6 months.    Time spent: 40 minutes-Greater than 50% of this time was spent in counseling, explanation of diagnosis, planning of further management, and coordination of care.   Signed, Skeet Latch, MD  12/29/2017 6:20 PM    [facility]

## 2018-01-04 ENCOUNTER — Ambulatory Visit
Admission: RE | Admit: 2018-01-04 | Discharge: 2018-01-04 | Disposition: A | Discharge status: Home / Self Care | Payer: Medicare Other | Source: Ambulatory Visit | Attending: Pain Medicine | Admitting: Pain Medicine

## 2018-01-04 ENCOUNTER — Other Ambulatory Visit: Payer: Self-pay | Admitting: Pain Medicine

## 2018-01-04 ENCOUNTER — Ambulatory Visit (INDEPENDENT_AMBULATORY_CARE_PROVIDER_SITE_OTHER): Payer: Medicare Other | Admitting: *Deleted

## 2018-01-04 DIAGNOSIS — M5136 Other intervertebral disc degeneration, lumbar region: Secondary | ICD-10-CM | POA: Diagnosis not present

## 2018-01-04 DIAGNOSIS — J309 Allergic rhinitis, unspecified: Secondary | ICD-10-CM

## 2018-01-04 DIAGNOSIS — M1612 Unilateral primary osteoarthritis, left hip: Secondary | ICD-10-CM | POA: Diagnosis not present

## 2018-01-04 DIAGNOSIS — M25552 Pain in left hip: Secondary | ICD-10-CM

## 2018-01-04 DIAGNOSIS — M47817 Spondylosis without myelopathy or radiculopathy, lumbosacral region: Secondary | ICD-10-CM | POA: Diagnosis not present

## 2018-01-11 ENCOUNTER — Other Ambulatory Visit: Payer: Self-pay | Admitting: *Deleted

## 2018-01-11 DIAGNOSIS — Z5181 Encounter for therapeutic drug level monitoring: Secondary | ICD-10-CM

## 2018-01-11 DIAGNOSIS — E7800 Pure hypercholesterolemia, unspecified: Secondary | ICD-10-CM

## 2018-01-11 DIAGNOSIS — I10 Essential (primary) hypertension: Secondary | ICD-10-CM

## 2018-01-12 ENCOUNTER — Ambulatory Visit: Payer: Medicare Other | Admitting: Allergy and Immunology

## 2018-01-15 DIAGNOSIS — H43812 Vitreous degeneration, left eye: Secondary | ICD-10-CM | POA: Diagnosis not present

## 2018-01-18 DIAGNOSIS — M47812 Spondylosis without myelopathy or radiculopathy, cervical region: Secondary | ICD-10-CM | POA: Diagnosis not present

## 2018-01-19 ENCOUNTER — Ambulatory Visit (INDEPENDENT_AMBULATORY_CARE_PROVIDER_SITE_OTHER): Payer: Medicare Other | Admitting: Allergy and Immunology

## 2018-01-19 ENCOUNTER — Encounter: Payer: Self-pay | Admitting: Allergy and Immunology

## 2018-01-19 VITALS — BP 104/62 | HR 66 | Resp 18 | Ht 71.0 in | Wt 170.0 lb

## 2018-01-19 DIAGNOSIS — I2510 Atherosclerotic heart disease of native coronary artery without angina pectoris: Secondary | ICD-10-CM | POA: Diagnosis not present

## 2018-01-19 DIAGNOSIS — K219 Gastro-esophageal reflux disease without esophagitis: Secondary | ICD-10-CM

## 2018-01-19 DIAGNOSIS — J3089 Other allergic rhinitis: Secondary | ICD-10-CM | POA: Diagnosis not present

## 2018-01-19 DIAGNOSIS — J4540 Moderate persistent asthma, uncomplicated: Secondary | ICD-10-CM

## 2018-01-19 NOTE — Progress Notes (Signed)
Follow-up Note  Referring Provider: Leighton Ruff, MD Primary Provider: Leighton Ruff, MD Date of Office Visit: 01/19/2018  Subjective:   [facility] (DOB: Jul 08, 1950) is a 67 y.o. male who returns to the [facility] [place] on 01/19/2018 in re-evaluation of the following:  HPI: [facility] returns to this clinic in evaluation of his asthma and allergic rhinoconjunctivitis and LPR.  His last visit to this clinic was 09 June 2017.  He has had a very good interval of time and has not had any significant respiratory tract symptoms involving either his upper or lower airway and has not required a systemic steroid or antibiotic to treat any type of respiratory tract issue.  He can exercise without difficulty and rarely uses any short acting bronchodilator while consistently using Symbicort.  He continues on nasal flunisolide as well.  He continues on immunotherapy currently at every 3 weeks without any adverse effects.  Immunotherapy has really resulted in very good control of his respiratory tract disease.  He believes that this treatment has prevented him from developing "bronchitis" which apparently was a very common event in the past.  His reflux is under excellent control and he has no issues with his throat.  [facility] has lost approximately 35 pounds of weight voluntarily since I have seen him in this clinic.  It sounds as though he has minimized consumption of mammal protein and has eliminated consumption of refined sugars.  Allergies as of 01/19/2018      Reactions   Atorvastatin Other (See Comments)   Calf pain.      Medication List      albuterol 108 (90 Base) MCG/ACT inhaler Commonly known as:  PROVENTIL HFA;VENTOLIN HFA Inhale 1 puff into the lungs as needed for wheezing or shortness of breath.   aspirin EC 81 MG tablet Take 81 mg by mouth daily.   budesonide-formoterol 160-4.5 MCG/ACT inhaler Commonly known as:  SYMBICORT Inhale 2 puffs into the lungs 2  (two) times daily.   cetirizine 10 MG tablet Commonly known as:  ZYRTEC Take 10 mg by mouth daily as needed for allergies.   Cranberry 1000 MG Caps Take by mouth.   lamoTRIgine 200 MG tablet Commonly known as:  LAMICTAL Take 200 mg by mouth daily.   multivitamin capsule Take 1 capsule by mouth daily.   OSTEO BI-FLEX ADV JOINT SHIELD PO Take 1 tablet by mouth daily.   pantoprazole 40 MG tablet Commonly known as:  PROTONIX Take 1 tablet (40 mg total) by mouth daily.   PROBIOTIC DAILY PO Take 1 tablet by mouth daily.   rosuvastatin 5 MG tablet Commonly known as:  CRESTOR Take 1 tablet (5 mg total) by mouth daily.   TOPROL XL 25 MG 24 hr tablet Generic drug:  metoprolol succinate Take 12.5 mg by mouth 2 (two) times daily.   Turmeric 500 MG Tabs Take 1 tablet by mouth daily.   Vitamin D3 5000 units Tabs Take 1 tablet by mouth daily.       Past Medical History:  Diagnosis Date  . Aortic insufficiency   . Arthritis   . Asthma   . Cervical disc disease   . Depression   . Diastolic dysfunction 11/03/6281  . Hyperlipidemia 08/24/2015  . Hypertension   . Lumbar disc disease   . Psoriasis     Past Surgical History:  Procedure Laterality Date  . CHOLECYSTECTOMY  2012  . HERNIA REPAIR    . PAROTIDECTOMY Right 1992  . PROSTATE SURGERY  2002, 2004  . SINOSCOPY    . SINUS EXPLORATION  2013  . SPINE SURGERY  2013   L C7-T1 laminectomy and discectomy  . TONSILLECTOMY      Review of systems negative except as noted in HPI / PMHx or noted below:  Review of Systems  Constitutional: Negative.   HENT: Negative.   Eyes: Negative.   Respiratory: Negative.   Cardiovascular: Negative.   Gastrointestinal: Negative.   Genitourinary: Negative.   Musculoskeletal: Negative.   Skin: Negative.   Neurological: Negative.   Endo/Heme/Allergies: Negative.   Psychiatric/Behavioral: Negative.      Objective:   Vitals:   01/19/18 1050  BP: 104/62  Pulse: 66  Resp: 18   SpO2: 96%   Height: 5\' 11"  (180.3 cm)  Weight: 170 lb (77.1 kg)   Physical Exam  HENT:  Head: Normocephalic.  Right Ear: Tympanic membrane, external ear and ear canal normal.  Left Ear: Tympanic membrane, external ear and ear canal normal.  Nose: Nose normal. No mucosal edema or rhinorrhea.  Mouth/Throat: Uvula is midline, oropharynx is clear and moist and mucous membranes are normal. No oropharyngeal exudate.  Eyes: Conjunctivae are normal.  Neck: Trachea normal. No tracheal tenderness present. No tracheal deviation present. No thyromegaly present.  Cardiovascular: Normal rate, regular rhythm, S1 normal, S2 normal and normal heart sounds.  No murmur heard. Pulmonary/Chest: Breath sounds normal. No stridor. No respiratory distress. He has no wheezes. He has no rales.  Musculoskeletal: He exhibits no edema.  Lymphadenopathy:       Head (right side): No tonsillar adenopathy present.       Head (left side): No tonsillar adenopathy present.    He has no cervical adenopathy.  Neurological: He is alert.  Skin: No rash noted. He is not diaphoretic. No erythema. Nails show no clubbing.    Diagnostics:    Spirometry was performed and demonstrated an FEV1 of 3.05 at 87 % of predicted.  The patient had an Asthma Control Test with the following results: ACT Total Score: 24.    Assessment and Plan:   1. Asthma, moderate persistent, well-controlled   2. Other allergic rhinitis   3. LPRD (laryngopharyngeal reflux disease)     1. Continue immunotherapy and EpiPen. Increase to every 4 weeks when completing build up with current vial.   2. Continue Symbicort 160 - 2 inhalations 1-2 times a day  3. Continue nasal flunisolide 1-2 sprays each nostril one time per day during upper airway symptoms  4. Continue Protonix 40 mg 3-7 times a week  5. Continue Zyrtec and albuterol HFA if needed  6. Return to clinic in 6 months or earlier if problem.  7. Obtain fall flu vaccine  [name] really  appears to be doing very well and I have asked him to consolidate some of his treatment.  He can attempt to use Symbicort just 1 time per day and can attempt to use Protonix just 3 times a week.  I had a talk with him today about his immunotherapy and he has the option of discontinuing this form of therapy at the end of this upcoming vial which would give him approximately 3 years of immunotherapy.  However, he is hesitant to discontinue this form of treatment and he does have the option of staying on immunotherapy every 4 weeks for a few more years.  I will see him back in this clinic in 6 months or earlier if there is a problem.  Allena Katz, MD Allergy /  Immunology [facility] [place]

## 2018-01-19 NOTE — Patient Instructions (Signed)
  1. Continue immunotherapy and EpiPen. Increase to every 4 weeks when completing build up with current vial.   2. Continue Symbicort 160 - 2 inhalations 1-2 times a day  3. Continue nasal flunisolide 1-2 sprays each nostril one time per day during upper airway symptoms  4. Continue Protonix 40 mg 3-7 times a week  5. Continue Zyrtec and albuterol HFA if needed  6. Return to clinic in 6 months or earlier if problem.  7. Obtain fall flu vaccine

## 2018-01-20 ENCOUNTER — Encounter: Payer: Self-pay | Admitting: Allergy and Immunology

## 2018-01-25 DIAGNOSIS — M542 Cervicalgia: Secondary | ICD-10-CM | POA: Diagnosis not present

## 2018-01-27 DIAGNOSIS — M5481 Occipital neuralgia: Secondary | ICD-10-CM | POA: Diagnosis not present

## 2018-01-29 DIAGNOSIS — S61211A Laceration without foreign body of left index finger without damage to nail, initial encounter: Secondary | ICD-10-CM | POA: Diagnosis not present

## 2018-02-05 ENCOUNTER — Ambulatory Visit (INDEPENDENT_AMBULATORY_CARE_PROVIDER_SITE_OTHER): Payer: Medicare Other

## 2018-02-05 DIAGNOSIS — J309 Allergic rhinitis, unspecified: Secondary | ICD-10-CM

## 2018-02-05 DIAGNOSIS — S61412A Laceration without foreign body of left hand, initial encounter: Secondary | ICD-10-CM | POA: Diagnosis not present

## 2018-02-05 DIAGNOSIS — S61211A Laceration without foreign body of left index finger without damage to nail, initial encounter: Secondary | ICD-10-CM | POA: Diagnosis not present

## 2018-02-05 DIAGNOSIS — I96 Gangrene, not elsewhere classified: Secondary | ICD-10-CM | POA: Diagnosis not present

## 2018-02-11 DIAGNOSIS — S61412D Laceration without foreign body of left hand, subsequent encounter: Secondary | ICD-10-CM | POA: Diagnosis not present

## 2018-02-18 DIAGNOSIS — D225 Melanocytic nevi of trunk: Secondary | ICD-10-CM | POA: Diagnosis not present

## 2018-02-18 DIAGNOSIS — L821 Other seborrheic keratosis: Secondary | ICD-10-CM | POA: Diagnosis not present

## 2018-02-18 DIAGNOSIS — L218 Other seborrheic dermatitis: Secondary | ICD-10-CM | POA: Diagnosis not present

## 2018-02-18 DIAGNOSIS — L814 Other melanin hyperpigmentation: Secondary | ICD-10-CM | POA: Diagnosis not present

## 2018-02-24 ENCOUNTER — Other Ambulatory Visit: Payer: Self-pay | Admitting: Gastroenterology

## 2018-02-24 DIAGNOSIS — K869 Disease of pancreas, unspecified: Secondary | ICD-10-CM

## 2018-03-09 DIAGNOSIS — S61412D Laceration without foreign body of left hand, subsequent encounter: Secondary | ICD-10-CM | POA: Diagnosis not present

## 2018-03-12 ENCOUNTER — Ambulatory Visit (INDEPENDENT_AMBULATORY_CARE_PROVIDER_SITE_OTHER): Payer: Medicare Other | Admitting: *Deleted

## 2018-03-12 DIAGNOSIS — J309 Allergic rhinitis, unspecified: Secondary | ICD-10-CM | POA: Diagnosis not present

## 2018-03-16 DIAGNOSIS — R278 Other lack of coordination: Secondary | ICD-10-CM | POA: Diagnosis not present

## 2018-03-16 DIAGNOSIS — R413 Other amnesia: Secondary | ICD-10-CM | POA: Diagnosis not present

## 2018-03-19 ENCOUNTER — Encounter: Payer: Self-pay | Admitting: Neurology

## 2018-03-23 NOTE — Progress Notes (Signed)
Arthur Moore was seen today in the movement disorders clinic for neurologic consultation at the request of Leighton Ruff, MD.  The consultation is for the evaluation of balance change and to rule out Parkinson's disease.  Medical records are reviewed that are available to me.  The patient reports that about 5 years ago while living in [place] he was diagnosed with Parkinson's disease by Dr. Lurline Del at [facility].  He was c/o stumbling and having word finding trouble.  Wife states that his handwriting had gotten small.  The patient was placed on levodopa for about a year.  He thinks maybe it helped in terms of helping clumsiness.  Following that, he had a DaTscan that was negative and he was then told he did not have Parkinson's disease.  He was told that perhaps his symptoms were due to medication side effect and he remembers that the medication was abilify.  I did have the opportunity to review the initial consult from [place] and he was on Abilify at the time of the consult, 2.5 mg, for 6 to 8 months prior to presentation.  6 months prior to presentation was when tremor started.  He was exposed to agent orange years ago in [place].   But since that time, the patient has developed more issues of feeling clumsy and off balance.  Stated that he wanted reassurance that he was not developing Parkinson's disease.   Specific Symptoms:  Tremor: maybe if holds hands out in front of him will have a bit of tremor Family hx of similar:  No. Voice: is louder due to hearing change.   Sleep: sleeps well with lamictal and xanax.    Vivid Dreams:  Yes.    Acting out dreams:  Yes.  , some screaming Wet Pillows: unsure if drooling or sweat Postural symptoms:  Yes.   - more off balance when puts on pants  Falls?  No. Bradykinesia symptoms: difficulty getting out of a chair Loss of smell:  No. Loss of taste:  No. Urinary Incontinence:  Yes.   (some mild dribble) Difficulty  Swallowing:  No. Handwriting, micrographia: No. - better than in the past Trouble with ADL's:  No.  Trouble buttoning clothing: No. Depression:  Yes.   - "prone to depression for years" - doing better now with newer meds (lamictal) Memory changes:  Yes.   , Especially with word finding troubles.  Also having trouble with misplacing things like keys, sunglasses.  Wife does monthly finances and always has.  He does own pills.  He does own driving.   Hallucinations:  No.  visual distortions: Yes.   N/V:  No. Lightheaded:  No.  Syncope: No. Diplopia:  No. Dyskinesia:  No. Prior exposure to reglan/antipsychotics: Yes.    Neuroimaging of the brain has previously been performed.  It is not available for my review today.  He was told he had WMD   ALLERGIES:   Allergies  Allergen Reactions  . Atorvastatin Other (See Comments)    Calf pain.    CURRENT MEDICATIONS:  Outpatient Encounter Medications as of 03/29/2018  Medication Sig  . albuterol (PROVENTIL HFA;VENTOLIN HFA) 108 (90 Base) MCG/ACT inhaler Inhale 1 puff into the lungs as needed for wheezing or shortness of breath.  Marland Kitchen aspirin EC 81 MG tablet Take 81 mg by mouth daily.  . budesonide-formoterol (SYMBICORT) 160-4.5 MCG/ACT inhaler Inhale 2 puffs into the lungs 2 (two) times daily.  . cetirizine (ZYRTEC) 10 MG tablet Take  10 mg by mouth daily as needed for allergies.  . Cholecalciferol (VITAMIN D3) 5000 units TABS Take 1 tablet by mouth daily.  . Cranberry 1000 MG CAPS Take by mouth.  . lamoTRIgine (LAMICTAL) 200 MG tablet Take 200 mg by mouth daily.  . metoprolol succinate (TOPROL XL) 25 MG 24 hr tablet Take 12.5 mg by mouth 2 (two) times daily.  . Misc Natural Products (OSTEO BI-FLEX ADV JOINT SHIELD PO) Take 1 tablet by mouth daily.  . Multiple Vitamin (MULTIVITAMIN) capsule Take 1 capsule by mouth daily.  . pantoprazole (PROTONIX) 40 MG tablet Take 1 tablet (40 mg total) by mouth daily.  . Probiotic Product (PROBIOTIC DAILY PO)  Take 1 tablet by mouth daily.  . rosuvastatin (CRESTOR) 5 MG tablet Take 1 tablet (5 mg total) by mouth daily.  . Turmeric 500 MG TABS Take 1 tablet by mouth daily.   No facility-administered encounter medications on file as of 03/29/2018.     PAST MEDICAL HISTORY:   Past Medical History:  Diagnosis Date  . Aortic insufficiency   . Arthritis   . Asthma   . Cervical disc disease   . Depression   . Diastolic dysfunction 2/35/3614  . Hyperlipidemia 08/24/2015  . Hypertension   . Lumbar disc disease   . Psoriasis     PAST SURGICAL HISTORY:   Past Surgical History:  Procedure Laterality Date  . CHOLECYSTECTOMY  2012  . HERNIA REPAIR    . PAROTIDECTOMY Right 1992  . PROSTATE SURGERY  2002, 2004  . SINOSCOPY    . SINUS EXPLORATION  2013  . SPINE SURGERY  2013   L C7-T1 laminectomy and discectomy  . TONSILLECTOMY      SOCIAL HISTORY:   Social History   Socioeconomic History  . Marital status: Married    Spouse name: Not on file  . Number of children: Not on file  . Years of education: Not on file  . Highest education level: Not on file  Occupational History  . Not on file  Social Needs  . Financial resource strain: Not on file  . Food insecurity:    Worry: Not on file    Inability: Not on file  . Transportation needs:    Medical: Not on file    Non-medical: Not on file  Tobacco Use  . Smoking status: Never Smoker  . Smokeless tobacco: Never Used  Substance and Sexual Activity  . Alcohol use: No    Alcohol/week: 0.0 standard drinks  . Drug use: No  . Sexual activity: Not on file  Lifestyle  . Physical activity:    Days per week: Not on file    Minutes per session: Not on file  . Stress: Not on file  Relationships  . Social connections:    Talks on phone: Not on file    Gets together: Not on file    Attends religious service: Not on file    Active member of club or organization: Not on file    Attends meetings of clubs or organizations: Not on file     Relationship status: Not on file  . Intimate partner violence:    Fear of current or ex partner: Not on file    Emotionally abused: Not on file    Physically abused: Not on file    Forced sexual activity: Not on file  Other Topics Concern  . Not on file  Social History Narrative  . Not on file    FAMILY HISTORY:  Family Status  Relation Name Status  . Mother  Alive  . Father  Deceased  . MGM  Deceased  . MGF  Deceased  . PGM  Deceased  . PGF  Deceased  . Other  (Not Specified)    ROS:  Review of Systems  Constitutional: Negative.   HENT: Negative.   Eyes: Negative.   Respiratory: Negative.   Cardiovascular: Negative.   Genitourinary: Negative.   Musculoskeletal: Positive for back pain and neck pain.  Skin: Negative.   Neurological: Negative.   Endo/Heme/Allergies: Negative.     PHYSICAL EXAMINATION:    VITALS:  There were no vitals filed for this visit.  GEN:  The patient appears stated age and is in NAD. HEENT:  Normocephalic, atraumatic.  The mucous membranes are moist. The superficial temporal arteries are without ropiness or tenderness. CV:  RRR Lungs:  CTAB Neck/HEME:  There are no carotid bruits bilaterally.  Neurological examination:  Orientation:  Montreal Cognitive Assessment  03/29/2018  Visuospatial/ Executive (0/5) 2  Naming (0/3) 3  Attention: Read list of digits (0/2) 2  Attention: Read list of letters (0/1) 1  Attention: Serial 7 subtraction starting at 100 (0/3) 3  Language: Repeat phrase (0/2) 2  Language : Fluency (0/1) 1  Abstraction (0/2) 1  Delayed Recall (0/5) 4  Orientation (0/6) 6  Total 25  Adjusted Score (based on education) 25   Cranial nerves: There is good facial symmetry. Pupils are equal round and reactive to light bilaterally. Fundoscopic exam reveals clear margins bilaterally. Extraocular muscles are intact. The visual fields are full to confrontational testing. The speech is fluent and clear. Soft palate rises  symmetrically and there is no tongue deviation. Hearing is intact to conversational tone. Sensation: Sensation is intact to light and pinprick throughout (facial, trunk, extremities). Vibration is intact at the bilateral big toe. There is no extinction with double simultaneous stimulation. There is no sensory dermatomal level identified. Motor: Strength is 5/5 in the bilateral upper and lower extremities.   Shoulder shrug is equal and symmetric.  There is no pronator drift. Deep tendon reflexes: Deep tendon reflexes are 2-/4 at the bilateral biceps, triceps, brachioradialis, patella and achilles. Plantar responses are downgoing bilaterally.  Movement examination: Tone: There is normal tone in the bilateral upper extremities.  The tone in the lower extremities is normal.  Abnormal movements: there is no tremor even with distraction procedures. Coordination:  There is no decremation with RAM's, with any form of RAMS, including alternating supination and pronation of the forearm, hand opening and closing, finger taps, heel taps and toe taps. Gait and Station: The patient has no difficulty arising out of a deep-seated chair without the use of the hands. The patient's stride length is good, but he has slight foot drop on the left.  The patient has a negative pull test.      ASSESSMENT/PLAN:  1.  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  Long counseling session with patient and his wife about the differences between idiopathic Parkinson's disease and secondary parkinsonism, in his case, due to Abilify.  Discussed that this does not make him at risk for idiopathic Parkinson's disease.  He has had a negative DaTscan in the past.  I see no evidence of parkinsonism today.  Reassurance was provided.  I would try to avoid the antipsychotic class of medication in him if at all possible.  He and I discussed this today.  2.  Memory change  -  Patient was worried about  dementia.  I saw no evidence of this today.  3.  Follow-up as needed.  Greater than 50% of the 45-minute visit was spent in counseling with patient and his wife.  Cc:  Leighton Ruff, MD

## 2018-03-29 ENCOUNTER — Ambulatory Visit (INDEPENDENT_AMBULATORY_CARE_PROVIDER_SITE_OTHER): Payer: Medicare Other | Admitting: Neurology

## 2018-03-29 ENCOUNTER — Encounter: Payer: Self-pay | Admitting: Neurology

## 2018-03-29 VITALS — BP 144/60 | HR 64 | Ht 72.0 in | Wt 175.0 lb

## 2018-03-29 DIAGNOSIS — I2510 Atherosclerotic heart disease of native coronary artery without angina pectoris: Secondary | ICD-10-CM | POA: Diagnosis not present

## 2018-03-29 DIAGNOSIS — G2111 Neuroleptic induced parkinsonism: Secondary | ICD-10-CM

## 2018-03-31 DIAGNOSIS — N289 Disorder of kidney and ureter, unspecified: Secondary | ICD-10-CM | POA: Diagnosis not present

## 2018-03-31 DIAGNOSIS — N281 Cyst of kidney, acquired: Secondary | ICD-10-CM | POA: Diagnosis not present

## 2018-04-05 ENCOUNTER — Ambulatory Visit
Admission: RE | Admit: 2018-04-05 | Discharge: 2018-04-05 | Disposition: A | Discharge status: Home / Self Care | Payer: Medicare Other | Source: Ambulatory Visit | Attending: Gastroenterology | Admitting: Gastroenterology

## 2018-04-05 DIAGNOSIS — K7460 Unspecified cirrhosis of liver: Secondary | ICD-10-CM | POA: Diagnosis not present

## 2018-04-05 DIAGNOSIS — K869 Disease of pancreas, unspecified: Secondary | ICD-10-CM

## 2018-04-05 MED ORDER — GADOBENATE DIMEGLUMINE 529 MG/ML IV SOLN
15.0000 mL | Freq: Once | INTRAVENOUS | Status: AC | PRN
  Administered 2018-04-05: 15 mL via INTRAVENOUS

## 2018-04-06 DIAGNOSIS — R300 Dysuria: Secondary | ICD-10-CM | POA: Diagnosis not present

## 2018-04-06 DIAGNOSIS — N281 Cyst of kidney, acquired: Secondary | ICD-10-CM | POA: Diagnosis not present

## 2018-04-06 DIAGNOSIS — N5201 Erectile dysfunction due to arterial insufficiency: Secondary | ICD-10-CM | POA: Diagnosis not present

## 2018-04-14 ENCOUNTER — Ambulatory Visit (INDEPENDENT_AMBULATORY_CARE_PROVIDER_SITE_OTHER): Payer: Medicare Other | Admitting: *Deleted

## 2018-04-14 DIAGNOSIS — J309 Allergic rhinitis, unspecified: Secondary | ICD-10-CM

## 2018-04-15 ENCOUNTER — Ambulatory Visit: Payer: Self-pay

## 2018-04-15 NOTE — Progress Notes (Signed)
VIALS EXP 04-20-19 

## 2018-04-16 DIAGNOSIS — K862 Cyst of pancreas: Secondary | ICD-10-CM | POA: Diagnosis not present

## 2018-04-16 DIAGNOSIS — E785 Hyperlipidemia, unspecified: Secondary | ICD-10-CM | POA: Diagnosis not present

## 2018-04-16 DIAGNOSIS — K7460 Unspecified cirrhosis of liver: Secondary | ICD-10-CM | POA: Diagnosis not present

## 2018-04-16 DIAGNOSIS — E7800 Pure hypercholesterolemia, unspecified: Secondary | ICD-10-CM | POA: Diagnosis not present

## 2018-04-16 DIAGNOSIS — N281 Cyst of kidney, acquired: Secondary | ICD-10-CM | POA: Diagnosis not present

## 2018-04-19 DIAGNOSIS — E785 Hyperlipidemia, unspecified: Secondary | ICD-10-CM | POA: Diagnosis not present

## 2018-04-19 DIAGNOSIS — J3089 Other allergic rhinitis: Secondary | ICD-10-CM | POA: Diagnosis not present

## 2018-04-20 DIAGNOSIS — J301 Allergic rhinitis due to pollen: Secondary | ICD-10-CM | POA: Diagnosis not present

## 2018-04-22 DIAGNOSIS — J3081 Allergic rhinitis due to animal (cat) (dog) hair and dander: Secondary | ICD-10-CM | POA: Diagnosis not present

## 2018-04-26 ENCOUNTER — Ambulatory Visit (INDEPENDENT_AMBULATORY_CARE_PROVIDER_SITE_OTHER): Payer: Medicare Other | Admitting: *Deleted

## 2018-04-26 DIAGNOSIS — J309 Allergic rhinitis, unspecified: Secondary | ICD-10-CM | POA: Diagnosis not present

## 2018-04-26 DIAGNOSIS — E7800 Pure hypercholesterolemia, unspecified: Secondary | ICD-10-CM | POA: Diagnosis not present

## 2018-04-26 DIAGNOSIS — I10 Essential (primary) hypertension: Secondary | ICD-10-CM | POA: Diagnosis not present

## 2018-05-12 ENCOUNTER — Ambulatory Visit (INDEPENDENT_AMBULATORY_CARE_PROVIDER_SITE_OTHER): Payer: Medicare Other | Admitting: *Deleted

## 2018-05-12 DIAGNOSIS — J309 Allergic rhinitis, unspecified: Secondary | ICD-10-CM | POA: Diagnosis not present

## 2018-05-13 DIAGNOSIS — M47817 Spondylosis without myelopathy or radiculopathy, lumbosacral region: Secondary | ICD-10-CM | POA: Diagnosis not present

## 2018-05-13 DIAGNOSIS — M5136 Other intervertebral disc degeneration, lumbar region: Secondary | ICD-10-CM | POA: Diagnosis not present

## 2018-05-17 ENCOUNTER — Other Ambulatory Visit: Payer: Self-pay | Admitting: Pain Medicine

## 2018-05-17 DIAGNOSIS — M545 Low back pain, unspecified: Secondary | ICD-10-CM

## 2018-05-25 ENCOUNTER — Ambulatory Visit (INDEPENDENT_AMBULATORY_CARE_PROVIDER_SITE_OTHER): Payer: Medicare Other | Admitting: Allergy and Immunology

## 2018-05-25 ENCOUNTER — Encounter: Payer: Self-pay | Admitting: Allergy and Immunology

## 2018-05-25 VITALS — BP 118/70 | HR 62 | Temp 98.1°F | Resp 18

## 2018-05-25 DIAGNOSIS — J3089 Other allergic rhinitis: Secondary | ICD-10-CM

## 2018-05-25 DIAGNOSIS — J069 Acute upper respiratory infection, unspecified: Secondary | ICD-10-CM

## 2018-05-25 DIAGNOSIS — K219 Gastro-esophageal reflux disease without esophagitis: Secondary | ICD-10-CM | POA: Diagnosis not present

## 2018-05-25 DIAGNOSIS — J4540 Moderate persistent asthma, uncomplicated: Secondary | ICD-10-CM

## 2018-05-25 DIAGNOSIS — B9789 Other viral agents as the cause of diseases classified elsewhere: Secondary | ICD-10-CM

## 2018-05-25 MED ORDER — BENZONATATE 100 MG PO CAPS: 100.0000 mg | ORAL_CAPSULE | Freq: Three times a day (TID) | ORAL | 0 refills | Status: DC | PRN

## 2018-05-25 NOTE — Progress Notes (Signed)
Follow-up Note  Referring Provider: Leighton Ruff, MD Primary Provider: Leighton Ruff, MD Date of Office Visit: 05/25/2018  Subjective:   Arthur Moore (DOB: 01/20/1951) is a 68 y.o. male who returns to the [facility] on 05/25/2018 in re-evaluation of the following:  HPI: Arthur Moore presents to this clinic in evaluation of asthma and allergic rhinoconjunctivitis and LPR.  His last visit to this clinic was 19 January 2018 at which point time he was really doing well regarding each issue.  He continued to do well with his asthma and rarely used a short acting bronchodilator while continuing to use a collection of therapy including Symbicort twice a day and his immunotherapy.  As well, has had very little issues with his nose while intermittently and rarely using a nasal steroid.  He has not required a systemic steroid or an antibiotic since being seen in this clinic.  Likewise, his reflux was under excellent control while using Protonix on a daily basis as well as being very careful about caffeine and chocolate consumption.  His immunotherapy is going quite well without any adverse effect currently at every 2 weeks.  Unfortunately, his wife had a viral respiratory tract infection and [name] developed head congestion and sneezing and clear rhinorrhea and coughing and just feeling terrible in general without any fever, ugly nasal discharge, phlegm production, or chest pain over the past 2 days.  Allergies as of 05/25/2018      Reactions   Atorvastatin Other (See Comments)   Calf pain.      Medication List      albuterol 108 (90 Base) MCG/ACT inhaler Commonly known as:  PROVENTIL HFA;VENTOLIN HFA Inhale 1 puff into the lungs as needed for wheezing or shortness of breath.   aspirin EC 81 MG tablet Take 81 mg by mouth daily.   benzonatate 100 MG capsule Commonly known as:  TESSALON Take 1 capsule (100 mg total) by mouth every 8 (eight) hours as needed for  cough.   budesonide-formoterol 160-4.5 MCG/ACT inhaler Commonly known as:  SYMBICORT Inhale 2 puffs into the lungs 2 (two) times daily.   cetirizine 10 MG tablet Commonly known as:  ZYRTEC Take 10 mg by mouth daily as needed for allergies.   Cranberry 1000 MG Caps Take by mouth.   Fish Oil 1000 MG Caps Take by mouth.   lamoTRIgine 200 MG tablet Commonly known as:  LAMICTAL Take 200 mg by mouth daily.   multivitamin capsule Take 1 capsule by mouth daily.   OSTEO BI-FLEX ADV JOINT SHIELD PO Take 1 tablet by mouth daily.   pantoprazole 40 MG tablet Commonly known as:  PROTONIX Take 1 tablet (40 mg total) by mouth daily.   PROBIOTIC DAILY PO Take 1 tablet by mouth daily.   rosuvastatin 5 MG tablet Commonly known as:  CRESTOR Take 1 tablet (5 mg total) by mouth daily.   TOPROL XL 25 MG 24 hr tablet Generic drug:  metoprolol succinate Take 12.5 mg by mouth 2 (two) times daily.   Vitamin D3 125 MCG (5000 UT) Tabs Take 1 tablet by mouth daily.       Past Medical History:  Diagnosis Date  . Aortic insufficiency   . Arthritis   . Asthma   . Cervical disc disease   . Depression   . Diastolic dysfunction 1/61/0960  . Hyperlipidemia 08/24/2015  . Hypertension   . Lumbar disc disease   . Psoriasis     Past Surgical History:  Procedure  Laterality Date  . CHOLECYSTECTOMY  2012  . HERNIA REPAIR    . PAROTIDECTOMY Right 1992  . PROSTATE SURGERY  2002, 2004  . SINOSCOPY    . SINUS EXPLORATION  2013  . SPINE SURGERY  2013   L C7-T1 laminectomy and discectomy  . TONSILLECTOMY      Review of systems negative except as noted in HPI / PMHx or noted below:  Review of Systems  Constitutional: Negative.   HENT: Negative.   Eyes: Negative.   Respiratory: Negative.   Cardiovascular: Negative.   Gastrointestinal: Negative.   Genitourinary: Negative.   Musculoskeletal: Negative.   Skin: Negative.   Neurological: Negative.   Endo/Heme/Allergies: Negative.     Psychiatric/Behavioral: Negative.      Objective:   Vitals:   05/25/18 1730  BP: 118/70  Pulse: 62  Resp: 18  Temp: 98.1 F (36.7 C)  SpO2: 92%          Physical Exam Constitutional:      Appearance: He is not diaphoretic.     Comments: Coughing, nasal voice  HENT:     Head: Normocephalic.     Right Ear: Tympanic membrane, ear canal and external ear normal.     Left Ear: Tympanic membrane, ear canal and external ear normal.     Nose: Mucosal edema (Erythematous) present. No rhinorrhea.     Mouth/Throat:     Pharynx: Uvula midline. Posterior oropharyngeal erythema present. No oropharyngeal exudate.  Eyes:     Conjunctiva/sclera: Conjunctivae normal.  Neck:     Thyroid: No thyromegaly.     Trachea: Trachea normal. No tracheal tenderness or tracheal deviation.  Cardiovascular:     Rate and Rhythm: Normal rate and regular rhythm.     Heart sounds: Normal heart sounds, S1 normal and S2 normal. No murmur.  Pulmonary:     Effort: No respiratory distress.     Breath sounds: Normal breath sounds. No stridor. No wheezing or rales.  Lymphadenopathy:     Head:     Right side of head: No tonsillar adenopathy.     Left side of head: No tonsillar adenopathy.     Cervical: No cervical adenopathy.  Skin:    Findings: No erythema or rash.     Nails: There is no clubbing.   Neurological:     Mental Status: He is alert.     Diagnostics:    Spirometry was not performed.  The patient had an Asthma Control Test with the following results: ACT Total Score: 20.    Assessment and Plan:   1. Asthma, moderate persistent, well-controlled   2. Other allergic rhinitis   3. LPRD (laryngopharyngeal reflux disease)   4. Viral upper respiratory tract infection with cough     1. Continue immunotherapy and EpiPen.    2. Continue Symbicort 160 - 2 inhalations 2 times a day  3. Continue nasal flunisolide 1-2 sprays each nostril one time per day during upper airway symptoms  4.  Continue Protonix 40 mg 1 time per day  5. Continue Zyrtec and albuterol HFA if needed  6. For this most recent episode:   A. Lots of nasal saline  B. mucinex DM - 2 tablets 2 times per day  C. Prednisone 10mg  - 1 tablet one time per day for 5 days only  D. Tessalon 100 mg 1 Perle every 8 hours if needed  E.  Increase Protonix to twice a day  7. Return to clinic in 6 months or earlier  if problem.   [name] apparently has a viral respiratory tract infection that is giving rise to significant inflammation of his airway and he will utilize the plan of action noted above to address this issue which includes low-dose systemic steroids for a few days.  He will continue on anti-inflammatory agents for his airway and therapy directed against reflux and assuming he does well I will see him back in his clinic in 6 months or earlier if there is a problem.  Allena Katz, MD Allergy / [facility]

## 2018-05-25 NOTE — Patient Instructions (Addendum)
  1. Continue immunotherapy and EpiPen.    2. Continue Symbicort 160 - 2 inhalations 2 times a day  3. Continue nasal flunisolide 1-2 sprays each nostril one time per day during upper airway symptoms  4. Continue Protonix 40 mg 1 time per day  5. Continue Zyrtec and albuterol HFA if needed  6. For this most recent episode:   A. Lots of nasal saline  B. mucinex DM - 2 tablets 2 times per day  C. Prednisone 10mg  - 1 tablet one time per day for 5 days only  D. Tessalon 100 mg 1 Perle every 8 hours if needed  E.  Increase Protonix to twice a day  6. Return to clinic in 6 months or earlier if problem.

## 2018-05-26 ENCOUNTER — Encounter: Payer: Self-pay | Admitting: Allergy and Immunology

## 2018-05-27 ENCOUNTER — Ambulatory Visit
Admission: RE | Admit: 2018-05-27 | Discharge: 2018-05-27 | Disposition: A | Discharge status: Home / Self Care | Payer: Medicare Other | Source: Ambulatory Visit | Attending: Pain Medicine | Admitting: Pain Medicine

## 2018-05-27 DIAGNOSIS — M5117 Intervertebral disc disorders with radiculopathy, lumbosacral region: Secondary | ICD-10-CM | POA: Diagnosis not present

## 2018-05-27 DIAGNOSIS — M48061 Spinal stenosis, lumbar region without neurogenic claudication: Secondary | ICD-10-CM | POA: Diagnosis not present

## 2018-05-27 DIAGNOSIS — M545 Low back pain, unspecified: Secondary | ICD-10-CM

## 2018-05-30 DIAGNOSIS — R05 Cough: Secondary | ICD-10-CM | POA: Diagnosis not present

## 2018-05-30 DIAGNOSIS — R062 Wheezing: Secondary | ICD-10-CM | POA: Diagnosis not present

## 2018-06-14 DIAGNOSIS — M5136 Other intervertebral disc degeneration, lumbar region: Secondary | ICD-10-CM | POA: Diagnosis not present

## 2018-06-14 DIAGNOSIS — M47817 Spondylosis without myelopathy or radiculopathy, lumbosacral region: Secondary | ICD-10-CM | POA: Diagnosis not present

## 2018-06-21 ENCOUNTER — Encounter: Payer: Self-pay | Admitting: Emergency Medicine

## 2018-06-21 ENCOUNTER — Ambulatory Visit (INDEPENDENT_AMBULATORY_CARE_PROVIDER_SITE_OTHER): Payer: Medicare Other | Admitting: Emergency Medicine

## 2018-06-21 DIAGNOSIS — J309 Allergic rhinitis, unspecified: Secondary | ICD-10-CM

## 2018-06-21 DIAGNOSIS — K219 Gastro-esophageal reflux disease without esophagitis: Secondary | ICD-10-CM

## 2018-06-21 DIAGNOSIS — J4520 Mild intermittent asthma, uncomplicated: Secondary | ICD-10-CM

## 2018-06-21 DIAGNOSIS — H1010 Acute atopic conjunctivitis, unspecified eye: Secondary | ICD-10-CM | POA: Diagnosis not present

## 2018-06-21 DIAGNOSIS — R05 Cough: Secondary | ICD-10-CM

## 2018-06-21 DIAGNOSIS — R053 Chronic cough: Secondary | ICD-10-CM | POA: Insufficient documentation

## 2018-06-21 NOTE — Progress Notes (Signed)
Subjective:    Patient ID: Arthur Moore, male    DOB: [birth year], 68 y.o.   MRN: [id number]  HPI Mr. Tugwell is 68, never smoker, with a history of aortic insufficiency, hypertension with diastolic dysfunction, hyperlipidemia, cervical disc disease, psoriatic arthritis for which he has been previously treated with methotrexate (last 2009), allergic rhinitis on immunotherapy, GERD.  He carries a history of asthma that was made as a younger man in the [employer].  His most recent spirometry from 2019 was actually normal.  He has been managed on Symbicort since it was started about 6 years ago in [place].  He moved here in 2016 and started on immunotherapy under the direction of Dr. Neldon Mc in 2017.  He has been doing quite well until he developed a URI about 7-1/2 weeks ago, he did have a sick contact from his wife.  He developed wheezing, paroxysmal cough that was worse when he was supine.  Minimally productive.  He was seen in urgent care, received Depo-Medrol and Mucinex.  He was then seen by Dr. Neldon Mc and was treated with a prednisone burst.  He was told to increase his pantoprazole, ramp-up his allergy regimen but he apparently did not do this.  He is also received cough suppression. He was then treated with azithromycin and a longer course of prednisone by the [place].  His cold symptoms improved, breathing is somewhat better.  He has continued to have a nonproductive harsh cough.  It comes in spells, is worst when he is supine.  He feels a burning in his chest associated with the cough.    Review of Systems  Constitutional: Negative for fever and unexpected weight change.  HENT: Positive for congestion and sneezing. Negative for dental problem, ear pain, nosebleeds, postnasal drip, rhinorrhea, sinus pressure, sore throat and trouble swallowing.   Eyes: Negative for redness and itching.  Respiratory: Positive for cough and shortness of breath. Negative for chest tightness and wheezing.     Cardiovascular: Positive for chest pain. Negative for palpitations and leg swelling.  Gastrointestinal: Negative for nausea and vomiting.  Genitourinary: Negative for dysuria.  Musculoskeletal: Negative for joint swelling.  Skin: Negative for rash.  Allergic/Immunologic: Negative.  Negative for environmental allergies, food allergies and immunocompromised state.  Neurological: Negative for headaches.  Hematological: Does not bruise/bleed easily.  Psychiatric/Behavioral: Negative for dysphoric mood. The patient is not nervous/anxious.    Past Medical History:  Diagnosis Date  . Aortic insufficiency   . Arthritis   . Asthma   . Cervical disc disease   . Depression   . Diastolic dysfunction 7/51/7001  . Hyperlipidemia 08/24/2015  . Hypertension   . Lumbar disc disease   . Psoriasis      Family History  Problem Relation Age of Onset  . Colon cancer Mother   . Lung cancer Father   . Cancer Other   . Bipolar disorder Brother      Social History   Socioeconomic History  . Marital status: Married    Spouse name: Not on file  . Number of children: Not on file  . Years of education: Not on file  . Highest education level: Not on file  Occupational History  . Not on file  Social Needs  . Financial resource strain: Not on file  . Food insecurity:    Worry: Not on file    Inability: Not on file  . Transportation needs:    Medical: Not on file    Non-medical: Not  on file  Tobacco Use  . Smoking status: Never Smoker  . Smokeless tobacco: Never Used  Substance and Sexual Activity  . Alcohol use: Yes    Alcohol/week: 0.0 standard drinks    Comment: occasional beer  . Drug use: No  . Sexual activity: Not on file  Lifestyle  . Physical activity:    Days per week: Not on file    Minutes per session: Not on file  . Stress: Not on file  Relationships  . Social connections:    Talks on phone: Not on file    Gets together: Not on file    Attends religious service: Not on  file    Active member of club or organization: Not on file    Attends meetings of clubs or organizations: Not on file    Relationship status: Not on file  . Intimate partner violence:    Fear of current or ex partner: Not on file    Emotionally abused: Not on file    Physically abused: Not on file    Forced sexual activity: Not on file  Other Topics Concern  . Not on file  Social History Narrative  . Not on file  He has lived in [place], [place], [place], [place] He was in both the [place], probably had an asbestos exposure.  He was exposed to agent orange in [place] No other inhaled exposures, he is currently retired   Allergies  Allergen Reactions  . Atorvastatin Other (See Comments)    Calf pain.     Outpatient Medications Prior to Visit  Medication Sig Dispense Refill  . albuterol (PROVENTIL HFA;VENTOLIN HFA) 108 (90 Base) MCG/ACT inhaler Inhale 1 puff into the lungs as needed for wheezing or shortness of breath.    Marland Kitchen aspirin EC 81 MG tablet Take 81 mg by mouth daily.    . benzonatate (TESSALON) 100 MG capsule Take 1 capsule (100 mg total) by mouth every 8 (eight) hours as needed for cough. 20 capsule 0  . budesonide-formoterol (SYMBICORT) 160-4.5 MCG/ACT inhaler Inhale 2 puffs into the lungs 2 (two) times daily.    . cetirizine (ZYRTEC) 10 MG tablet Take 10 mg by mouth daily as needed for allergies.    . Cholecalciferol (VITAMIN D3) 5000 units TABS Take 1 tablet by mouth daily.    . Cranberry 1000 MG CAPS Take by mouth.    . lamoTRIgine (LAMICTAL) 200 MG tablet Take 200 mg by mouth daily.    . metoprolol succinate (TOPROL XL) 25 MG 24 hr tablet Take 12.5 mg by mouth 2 (two) times daily.    . Misc Natural Products (OSTEO BI-FLEX ADV JOINT SHIELD PO) Take 1 tablet by mouth daily.    . Multiple Vitamin (MULTIVITAMIN) capsule Take 1 capsule by mouth daily.    . Omega-3 Fatty Acids (FISH OIL) 1000 MG CAPS Take by mouth.    . pantoprazole (PROTONIX) 40 MG  tablet Take 1 tablet (40 mg total) by mouth daily. 90 tablet 1  . Probiotic Product (PROBIOTIC DAILY PO) Take 1 tablet by mouth daily.    . rosuvastatin (CRESTOR) 5 MG tablet Take 1 tablet (5 mg total) by mouth daily. 90 tablet 3   No facility-administered medications prior to visit.        Objective:   Physical Exam Vitals:   06/21/18 1530  BP: 120/76  Pulse: 65  SpO2: 97%  Weight: 180 lb 6.4 oz (81.8 kg)  Height: 6' (1.829 m)  Gen: Pleasant, well-nourished, in no distress,  normal affect  ENT: No lesions,  mouth clear,  oropharynx clear, no postnasal drip, strong voice  Neck: No JVD, no stridor  Lungs: No use of accessory muscles, no crackles or wheezing on normal respiration, no wheeze on forced expiration  Cardiovascular: RRR, heart sounds normal, no murmur or gallops, no peripheral edema  Musculoskeletal: No deformities, no cyanosis or clubbing  Neuro: alert, awake, non focal  Skin: Warm, no lesions or rash       Assessment & Plan:  Asthma His asthma was initially diagnosed as a younger man, reports that he did have abnormal spirometry in the past.  His most recent spirometry in [place] was normal (on treatment).  I discussed possibly taking a break from his Symbicort to see if this would be the improvement in his upper airway irritation and cough.  He believes that if he were to stop the Symbicort he would backtrack on his asthma control.  We will continue it.  Plan to repeat full pulmonary function testing when he is back at his baseline.  Chronic cough He has upper airway irritation and chronic cough as the last symptom of his recent upper respiratory infection (+/- asthma exacerbation, +/- bronchitis).  Contributing factors now likely include his chronic allergic rhinitis, chronic esophageal reflux, possibly is inhaled medication.  I do not any bronchospasm on exam.  He does not have stridor currently.  He did have some dry cough rarely through the interview.   Dr. Neldon Mc asked him to increase his pantoprazole but he has not done this yet.  I recommended it again and he is willing to do so.  He is on immunotherapy but he is only taking his Zyrtec and flunisolide nasal spray intermittently.  I have asked him to start these every day also.  We talked also about strategies for voice rest, avoiding GERD symptoms, cough suppression to get out of the cough cycle.  I will follow-up with him in about 1 month  Laryngopharyngeal reflux (LPR) Increase Pantoprazole to bid  Allergic rhinoconjunctivitis Continue immunotherapy. Start using his flunisolide and zyrtec daily on a schedule.   Baltazar Apo, MD, PhD 06/21/2018, 5:36 PM [facility] [phone] or if no answer [phone]

## 2018-06-21 NOTE — Patient Instructions (Signed)
Please increase your pantoprazole to 40 mg twice a day.  Take this medication 30 to 60 minutes around food. Ensure that the head of your bed is raised, avoid acid producing foods, do not eat after 8:00 PM. Start taking your Zyrtec 10 mg every day until next visit. Take your flunisolide nasal spray 2 sprays each nostril once daily until next visit. Continue your immunotherapy as managed by Dr. Neldon Mc Cough suppression with cough drops, Tessalon Perles or your existing cough syrup as needed. Try to give yourself a period of voice rest, for several days if possible. Continue your Symbicort 2 puffs twice daily.  Remember to rinse and gargle after use this medication. Keep your albuterol available use 2 puffs if needed for shortness of breath, chest tightness, wheezing. We will likely repeat your pulmonary function testing at some point in the future once you are back to usual baseline. Follow with Dr Lamonte Sakai in 1 month

## 2018-06-21 NOTE — Assessment & Plan Note (Signed)
Increase Pantoprazole to bid.

## 2018-06-21 NOTE — Assessment & Plan Note (Signed)
Continue immunotherapy. Start using his flunisolide and zyrtec daily on a schedule.

## 2018-06-21 NOTE — Assessment & Plan Note (Signed)
His asthma was initially diagnosed as a younger man, reports that he did have abnormal spirometry in the past.  His most recent spirometry in [place] was normal (on treatment).  I discussed possibly taking a break from his Symbicort to see if this would be the improvement in his upper airway irritation and cough.  He believes that if he were to stop the Symbicort he would backtrack on his asthma control.  We will continue it.  Plan to repeat full pulmonary function testing when he is back at his baseline.

## 2018-06-21 NOTE — Assessment & Plan Note (Signed)
He has upper airway irritation and chronic cough as the last symptom of his recent upper respiratory infection (+/- asthma exacerbation, +/- bronchitis).  Contributing factors now likely include his chronic allergic rhinitis, chronic esophageal reflux, possibly is inhaled medication.  I do not any bronchospasm on exam.  He does not have stridor currently.  He did have some dry cough rarely through the interview.  Dr. Neldon Mc asked him to increase his pantoprazole but he has not done this yet.  I recommended it again and he is willing to do so.  He is on immunotherapy but he is only taking his Zyrtec and flunisolide nasal spray intermittently.  I have asked him to start these every day also.  We talked also about strategies for voice rest, avoiding GERD symptoms, cough suppression to get out of the cough cycle.  I will follow-up with him in about 1 month

## 2018-06-28 ENCOUNTER — Ambulatory Visit (INDEPENDENT_AMBULATORY_CARE_PROVIDER_SITE_OTHER): Payer: Medicare Other

## 2018-06-28 DIAGNOSIS — H903 Sensorineural hearing loss, bilateral: Secondary | ICD-10-CM | POA: Diagnosis not present

## 2018-06-28 DIAGNOSIS — J309 Allergic rhinitis, unspecified: Secondary | ICD-10-CM

## 2018-07-08 ENCOUNTER — Observation Stay (HOSPITAL_COMMUNITY)
Admission: EM | Admit: 2018-07-08 | Discharge: 2018-07-09 | Disposition: A | Discharge status: Home / Self Care | Payer: Medicare Other | Attending: Internal Medicine | Admitting: Internal Medicine

## 2018-07-08 ENCOUNTER — Inpatient Hospital Stay (HOSPITAL_COMMUNITY): Payer: Medicare Other

## 2018-07-08 ENCOUNTER — Emergency Department (HOSPITAL_COMMUNITY): Payer: Medicare Other

## 2018-07-08 ENCOUNTER — Encounter (HOSPITAL_COMMUNITY)
Admission: EM | Disposition: A | Discharge status: Home / Self Care | Payer: Self-pay | Source: Home / Self Care | Attending: Emergency Medicine

## 2018-07-08 ENCOUNTER — Encounter (HOSPITAL_COMMUNITY): Payer: Self-pay | Admitting: Emergency Medicine

## 2018-07-08 ENCOUNTER — Other Ambulatory Visit: Payer: Self-pay

## 2018-07-08 DIAGNOSIS — R0602 Shortness of breath: Secondary | ICD-10-CM | POA: Diagnosis not present

## 2018-07-08 DIAGNOSIS — N182 Chronic kidney disease, stage 2 (mild): Secondary | ICD-10-CM | POA: Insufficient documentation

## 2018-07-08 DIAGNOSIS — J45909 Unspecified asthma, uncomplicated: Secondary | ICD-10-CM | POA: Insufficient documentation

## 2018-07-08 DIAGNOSIS — Z7951 Long term (current) use of inhaled steroids: Secondary | ICD-10-CM | POA: Insufficient documentation

## 2018-07-08 DIAGNOSIS — D696 Thrombocytopenia, unspecified: Secondary | ICD-10-CM | POA: Diagnosis not present

## 2018-07-08 DIAGNOSIS — E785 Hyperlipidemia, unspecified: Secondary | ICD-10-CM | POA: Insufficient documentation

## 2018-07-08 DIAGNOSIS — E782 Mixed hyperlipidemia: Secondary | ICD-10-CM

## 2018-07-08 DIAGNOSIS — M1990 Unspecified osteoarthritis, unspecified site: Secondary | ICD-10-CM | POA: Diagnosis not present

## 2018-07-08 DIAGNOSIS — I200 Unstable angina: Secondary | ICD-10-CM

## 2018-07-08 DIAGNOSIS — Z8673 Personal history of transient ischemic attack (TIA), and cerebral infarction without residual deficits: Secondary | ICD-10-CM | POA: Diagnosis not present

## 2018-07-08 DIAGNOSIS — R079 Chest pain, unspecified: Secondary | ICD-10-CM | POA: Diagnosis not present

## 2018-07-08 DIAGNOSIS — I351 Nonrheumatic aortic (valve) insufficiency: Secondary | ICD-10-CM | POA: Diagnosis present

## 2018-07-08 DIAGNOSIS — G4733 Obstructive sleep apnea (adult) (pediatric): Secondary | ICD-10-CM | POA: Diagnosis not present

## 2018-07-08 DIAGNOSIS — R000 Tachycardia, unspecified: Secondary | ICD-10-CM | POA: Diagnosis not present

## 2018-07-08 DIAGNOSIS — R0789 Other chest pain: Secondary | ICD-10-CM

## 2018-07-08 DIAGNOSIS — I061 Rheumatic aortic insufficiency: Secondary | ICD-10-CM | POA: Diagnosis not present

## 2018-07-08 DIAGNOSIS — I10 Essential (primary) hypertension: Secondary | ICD-10-CM | POA: Diagnosis present

## 2018-07-08 DIAGNOSIS — R231 Pallor: Secondary | ICD-10-CM | POA: Diagnosis not present

## 2018-07-08 DIAGNOSIS — I2510 Atherosclerotic heart disease of native coronary artery without angina pectoris: Secondary | ICD-10-CM

## 2018-07-08 DIAGNOSIS — Z7982 Long term (current) use of aspirin: Secondary | ICD-10-CM | POA: Insufficient documentation

## 2018-07-08 DIAGNOSIS — I5032 Chronic diastolic (congestive) heart failure: Secondary | ICD-10-CM | POA: Insufficient documentation

## 2018-07-08 DIAGNOSIS — Z888 Allergy status to other drugs, medicaments and biological substances status: Secondary | ICD-10-CM | POA: Diagnosis not present

## 2018-07-08 DIAGNOSIS — I25110 Atherosclerotic heart disease of native coronary artery with unstable angina pectoris: Secondary | ICD-10-CM | POA: Diagnosis not present

## 2018-07-08 DIAGNOSIS — Z79899 Other long term (current) drug therapy: Secondary | ICD-10-CM | POA: Insufficient documentation

## 2018-07-08 DIAGNOSIS — I130 Hypertensive heart and chronic kidney disease with heart failure and stage 1 through stage 4 chronic kidney disease, or unspecified chronic kidney disease: Secondary | ICD-10-CM | POA: Diagnosis not present

## 2018-07-08 DIAGNOSIS — R0902 Hypoxemia: Secondary | ICD-10-CM | POA: Diagnosis not present

## 2018-07-08 HISTORY — PX: LEFT HEART CATH AND CORONARY ANGIOGRAPHY: CATH118249

## 2018-07-08 LAB — I-STAT TROPONIN, ED: Troponin i, poc: 0.01 ng/mL (ref 0.00–0.08)

## 2018-07-08 LAB — BASIC METABOLIC PANEL
Anion gap: 4 — ABNORMAL LOW (ref 5–15)
BUN: 27 mg/dL — ABNORMAL HIGH (ref 8–23)
CO2: 22 mmol/L (ref 22–32)
Calcium: 8.8 mg/dL — ABNORMAL LOW (ref 8.9–10.3)
Chloride: 115 mmol/L — ABNORMAL HIGH (ref 98–111)
Creatinine, Ser: 1.27 mg/dL — ABNORMAL HIGH (ref 0.61–1.24)
GFR calc Af Amer: >60 mL/min (ref >60)
GFR calc non Af Amer: 58 mL/min — ABNORMAL LOW (ref >60)
Glucose, Bld: 79 mg/dL (ref 70–99)
Potassium: 3.9 mmol/L (ref 3.5–5.1)
Sodium: 141 mmol/L (ref 135–145)

## 2018-07-08 LAB — CBC
HCT: 41 % (ref 39.0–52.0)
Hemoglobin: 13.3 g/dL (ref 13.0–17.0)
MCH: 29 pg (ref 26.0–34.0)
MCHC: 32.4 g/dL (ref 30.0–36.0)
MCV: 89.3 fL (ref 80.0–100.0)
Platelets: 101 10*3/uL — ABNORMAL LOW (ref 150–400)
RBC: 4.59 MIL/uL (ref 4.22–5.81)
RDW: 13.3 % (ref 11.5–15.5)
WBC: 4.8 10*3/uL (ref 4.0–10.5)
nRBC: 0 % (ref 0.0–0.2)

## 2018-07-08 LAB — TROPONIN I: Troponin I: <0.03 ng/mL (ref <0.03)

## 2018-07-08 SURGERY — LEFT HEART CATH AND CORONARY ANGIOGRAPHY
Anesthesia: LOCAL

## 2018-07-08 MED ORDER — LORATADINE 10 MG PO TABS: 10.0000 mg | ORAL_TABLET | Freq: Every day | ORAL | Status: DC

## 2018-07-08 MED ORDER — VERAPAMIL HCL 2.5 MG/ML IV SOLN
INTRAVENOUS | Status: AC
  Filled 2018-07-08: qty 2

## 2018-07-08 MED ORDER — ACETAMINOPHEN 325 MG PO TABS: 650.0000 mg | ORAL_TABLET | ORAL | Status: DC | PRN

## 2018-07-08 MED ORDER — SODIUM CHLORIDE 0.9% FLUSH
3.0000 mL | Freq: Two times a day (BID) | INTRAVENOUS | Status: DC
  Administered 2018-07-09: 3 mL via INTRAVENOUS

## 2018-07-08 MED ORDER — ALBUTEROL SULFATE HFA 108 (90 BASE) MCG/ACT IN AERS
1.0000 | INHALATION_SPRAY | RESPIRATORY_TRACT | Status: DC | PRN
  Filled 2018-07-08: qty 6.7

## 2018-07-08 MED ORDER — ASPIRIN EC 81 MG PO TBEC: 81.0000 mg | DELAYED_RELEASE_TABLET | Freq: Every day | ORAL | Status: DC

## 2018-07-08 MED ORDER — ASPIRIN 81 MG PO CHEW: 81.0000 mg | CHEWABLE_TABLET | ORAL | Status: DC

## 2018-07-08 MED ORDER — HEPARIN (PORCINE) 25000 UT/250ML-% IV SOLN
1000.0000 [IU]/h | INTRAVENOUS | Status: DC
  Administered 2018-07-08: 1000 [IU]/h via INTRAVENOUS
  Filled 2018-07-08: qty 250

## 2018-07-08 MED ORDER — LIDOCAINE HCL (PF) 1 % IJ SOLN
INTRAMUSCULAR | Status: AC
  Filled 2018-07-08: qty 30

## 2018-07-08 MED ORDER — FENTANYL CITRATE (PF) 100 MCG/2ML IJ SOLN
INTRAMUSCULAR | Status: DC | PRN
  Administered 2018-07-08: 50 ug via INTRAVENOUS
  Administered 2018-07-08: 25 ug via INTRAVENOUS

## 2018-07-08 MED ORDER — ALBUTEROL SULFATE (2.5 MG/3ML) 0.083% IN NEBU: 2.5000 mg | INHALATION_SOLUTION | RESPIRATORY_TRACT | Status: DC | PRN

## 2018-07-08 MED ORDER — HEPARIN (PORCINE) IN NACL 1000-0.9 UT/500ML-% IV SOLN
INTRAVENOUS | Status: AC
  Filled 2018-07-08: qty 1000

## 2018-07-08 MED ORDER — LORATADINE 10 MG PO TABS
10.0000 mg | ORAL_TABLET | Freq: Every day | ORAL | Status: DC
  Administered 2018-07-09: 10 mg via ORAL
  Filled 2018-07-08: qty 1

## 2018-07-08 MED ORDER — ASPIRIN EC 81 MG PO TBEC
81.0000 mg | DELAYED_RELEASE_TABLET | Freq: Every day | ORAL | Status: DC
  Administered 2018-07-09: 81 mg via ORAL
  Filled 2018-07-08: qty 1

## 2018-07-08 MED ORDER — SODIUM CHLORIDE 0.9 % IV SOLN: 250.0000 mL | INTRAVENOUS | Status: DC | PRN

## 2018-07-08 MED ORDER — SODIUM CHLORIDE 0.9 % IV SOLN: INTRAVENOUS | Status: AC

## 2018-07-08 MED ORDER — HEPARIN SODIUM (PORCINE) 1000 UNIT/ML IJ SOLN
INTRAMUSCULAR | Status: DC | PRN
  Administered 2018-07-08: 4000 [IU] via INTRAVENOUS

## 2018-07-08 MED ORDER — PANTOPRAZOLE SODIUM 40 MG PO TBEC
40.0000 mg | DELAYED_RELEASE_TABLET | Freq: Every day | ORAL | Status: DC
  Administered 2018-07-09: 40 mg via ORAL
  Filled 2018-07-08: qty 1

## 2018-07-08 MED ORDER — SODIUM CHLORIDE 0.9 % WEIGHT BASED INFUSION: 3.0000 mL/kg/h | INTRAVENOUS | Status: DC

## 2018-07-08 MED ORDER — PANTOPRAZOLE SODIUM 40 MG PO TBEC: 40.0000 mg | DELAYED_RELEASE_TABLET | Freq: Every day | ORAL | Status: DC

## 2018-07-08 MED ORDER — ONDANSETRON HCL 4 MG/2ML IJ SOLN: 4.0000 mg | Freq: Four times a day (QID) | INTRAMUSCULAR | Status: DC | PRN

## 2018-07-08 MED ORDER — MIDAZOLAM HCL 2 MG/2ML IJ SOLN
INTRAMUSCULAR | Status: AC
  Filled 2018-07-08: qty 2

## 2018-07-08 MED ORDER — ROSUVASTATIN CALCIUM 5 MG PO TABS
5.0000 mg | ORAL_TABLET | Freq: Every day | ORAL | Status: DC
  Administered 2018-07-09: 5 mg via ORAL
  Filled 2018-07-08: qty 1

## 2018-07-08 MED ORDER — LIDOCAINE HCL (PF) 1 % IJ SOLN
INTRAMUSCULAR | Status: DC | PRN
  Administered 2018-07-08: 2 mL via INTRADERMAL

## 2018-07-08 MED ORDER — MIDAZOLAM HCL 2 MG/2ML IJ SOLN
INTRAMUSCULAR | Status: DC | PRN
  Administered 2018-07-08: 1 mg via INTRAVENOUS
  Administered 2018-07-08: 2 mg via INTRAVENOUS

## 2018-07-08 MED ORDER — SODIUM CHLORIDE 0.9 % WEIGHT BASED INFUSION: 1.0000 mL/kg/h | INTRAVENOUS | Status: DC

## 2018-07-08 MED ORDER — HEPARIN SODIUM (PORCINE) 1000 UNIT/ML IJ SOLN
INTRAMUSCULAR | Status: AC
  Filled 2018-07-08: qty 1

## 2018-07-08 MED ORDER — VERAPAMIL HCL 2.5 MG/ML IV SOLN
INTRAVENOUS | Status: DC | PRN
  Administered 2018-07-08: 10 mL via INTRA_ARTERIAL

## 2018-07-08 MED ORDER — HEPARIN (PORCINE) IN NACL 1000-0.9 UT/500ML-% IV SOLN
INTRAVENOUS | Status: DC | PRN
  Administered 2018-07-08 (×2): 500 mL

## 2018-07-08 MED ORDER — NITROGLYCERIN 0.4 MG SL SUBL: 0.4000 mg | SUBLINGUAL_TABLET | SUBLINGUAL | Status: DC | PRN

## 2018-07-08 MED ORDER — FLUTICASONE FUROATE-VILANTEROL 200-25 MCG/INH IN AEPB
1.0000 | INHALATION_SPRAY | Freq: Every day | RESPIRATORY_TRACT | Status: DC
  Administered 2018-07-09: 1 via RESPIRATORY_TRACT
  Filled 2018-07-08: qty 28

## 2018-07-08 MED ORDER — IOHEXOL 350 MG/ML SOLN
INTRAVENOUS | Status: DC | PRN
  Administered 2018-07-08: 75 mL via INTRA_ARTERIAL

## 2018-07-08 MED ORDER — SODIUM CHLORIDE 0.9% FLUSH: 3.0000 mL | INTRAVENOUS | Status: DC | PRN

## 2018-07-08 MED ORDER — FENTANYL CITRATE (PF) 100 MCG/2ML IJ SOLN
INTRAMUSCULAR | Status: AC
  Filled 2018-07-08: qty 2

## 2018-07-08 MED ORDER — LAMOTRIGINE 100 MG PO TABS: 400.0000 mg | ORAL_TABLET | Freq: Every day | ORAL | Status: DC

## 2018-07-08 MED ORDER — METOPROLOL SUCCINATE ER 25 MG PO TB24
12.5000 mg | ORAL_TABLET | Freq: Two times a day (BID) | ORAL | Status: DC
  Administered 2018-07-09: 12.5 mg via ORAL
  Filled 2018-07-08: qty 1

## 2018-07-08 MED ORDER — HEPARIN BOLUS VIA INFUSION
4000.0000 [IU] | Freq: Once | INTRAVENOUS | Status: AC
  Administered 2018-07-08: 4000 [IU] via INTRAVENOUS
  Filled 2018-07-08: qty 4000

## 2018-07-08 MED ORDER — LAMOTRIGINE 100 MG PO TABS
400.0000 mg | ORAL_TABLET | Freq: Every day | ORAL | Status: DC
  Administered 2018-07-08: 400 mg via ORAL
  Filled 2018-07-08: qty 4

## 2018-07-08 SURGICAL SUPPLY — 10 items
CATH 5FR JL3.5 JR4 ANG PIG MP (CATHETERS) ×2 IMPLANT
DEVICE RAD COMP TR BAND LRG (VASCULAR PRODUCTS) ×2 IMPLANT
GLIDESHEATH SLEND SS 6F .021 (SHEATH) ×2 IMPLANT
GUIDEWIRE INQWIRE 1.5J.035X260 (WIRE) ×1 IMPLANT
INQWIRE 1.5J .035X260CM (WIRE) ×2
KIT HEART LEFT (KITS) ×2 IMPLANT
PACK CARDIAC CATHETERIZATION (CUSTOM PROCEDURE TRAY) ×2 IMPLANT
SYR MEDRAD MARK 7 150ML (SYRINGE) ×2 IMPLANT
TRANSDUCER W/STOPCOCK (MISCELLANEOUS) ×2 IMPLANT
TUBING CIL FLEX 10 FLL-RA (TUBING) ×2 IMPLANT

## 2018-07-08 NOTE — H&P (Signed)
Cardiology Admission History and Physical:   Patient ID: Arthur Moore MRN: [id number]; DOB: [birth year]   Admission date: 07/08/2018  Primary Care Provider: Leighton Ruff, MD Primary Cardiologist: Skeet Latch, MD  Primary Electrophysiologist:  None   Chief Complaint:  Chest pain  Patient Profile:   Arthur Moore is a 68 y.o. male with coronary artery calcification, mild-moderate aortic insufficiency, HTN, mild to moderate aortic regurgitation, asthma, TIA, OSA not on CPAP, liver fibrosis, anxiety, and depression.   History of Present Illness:   Mr. Kundrat was recently seen by Dr. Oval Linsey in clinic on 12/29/17. He presented in 2018 with atypical chest pain and fatigue and underwent exercise myoview 06/2016 that revealed EF of 49% and asynchronous contraction, a small defect in the basal inferior region thought to be diaphragmatic attenuation. He ultimately was diagnosed with diverticulitis.  He was again seen in the ED with atypical chest pain and subsequently underwent coronary CT with FFR 04/2017. CT revealed Ca score of 361 which put him at the 78th percentile for his age and gender. FFR was 0.83 in the mid LAD, which was not significant for obstructive disease. He also had insignificant stenosis in the LCx and RCA. Echo 08/2017 with EF of 55-65%.   He presented [facility] today with onset of chest pain at 9AM this morning. He is very active and plays pickleball 4-5 times per week. This morning he ate breakfast at 8AM and then played pickleball at 8:30 AM. He was only able to play one game because he felt weak and started to get substernal chest pain that felt like a pressure and tightness rated as a 8/10. The pain did not radiate, but he felt nauseous and was diaphoretic (more than normal after a game). He was also short of breath. He received ASA and one SL nitro which relieved his pain to a 5/10. Heparin drip was started. He now wants to go home.    Past Medical History:   Diagnosis Date  . Aortic insufficiency   . Arthritis   . Asthma   . Cervical disc disease   . Depression   . Diastolic dysfunction 07/17/252  . Hyperlipidemia 08/24/2015  . Hypertension   . Lumbar disc disease   . Psoriasis     Past Surgical History:  Procedure Laterality Date  . CHOLECYSTECTOMY  2012  . HERNIA REPAIR    . PAROTIDECTOMY Right 1992  . PROSTATE SURGERY  2002, 2004  . SINOSCOPY    . SINUS EXPLORATION  2013  . SPINE SURGERY  2013   L C7-T1 laminectomy and discectomy  . TONSILLECTOMY       Medications Prior to Admission: Prior to Admission medications   Medication Sig Start Date End Date Taking? Authorizing Provider  albuterol (PROVENTIL HFA;VENTOLIN HFA) 108 (90 Base) MCG/ACT inhaler Inhale 1 puff into the lungs as needed for wheezing or shortness of breath.   Yes [provider]  aspirin EC 81 MG tablet Take 81 mg by mouth daily.   Yes [provider]  budesonide-formoterol (SYMBICORT) 160-4.5 MCG/ACT inhaler Inhale 2 puffs into the lungs 2 (two) times daily.   Yes [provider]  cetirizine (ZYRTEC) 10 MG tablet Take 10 mg by mouth daily.    Yes [provider]  Cholecalciferol (VITAMIN D3) 5000 units TABS Take 5,000 Units by mouth daily.    Yes [provider]  Cranberry 1000 MG CAPS Take 1,000 mg by mouth daily.    Yes [provider]  lamoTRIgine (  LAMICTAL) 200 MG tablet Take 400 mg by mouth daily.    Yes [provider]  metoprolol succinate (TOPROL XL) 25 MG 24 hr tablet Take 12.5 mg by mouth 2 (two) times daily.   Yes [provider]  Misc Natural Products (OSTEO BI-FLEX ADV JOINT SHIELD PO) Take 1 tablet by mouth daily.   Yes [provider]  Multiple Vitamin (MULTIVITAMIN) capsule Take 1 capsule by mouth daily.   Yes [provider]  Omega-3 Fatty Acids (FISH OIL) 1000 MG CAPS Take 2,400 mg by mouth daily.    Yes [provider]  pantoprazole (PROTONIX)  40 MG tablet Take 1 tablet (40 mg total) by mouth daily. 06/09/17  Yes Kozlow, Donnamarie Poag, MD  Probiotic Product (PROBIOTIC DAILY PO) Take 1 tablet by mouth daily.   Yes [provider]  rosuvastatin (CRESTOR) 5 MG tablet Take 1 tablet (5 mg total) by mouth daily. 10/07/17 07/08/18 Yes Skeet Latch, MD  benzonatate (TESSALON) 100 MG capsule Take 1 capsule (100 mg total) by mouth every 8 (eight) hours as needed for cough. Patient not taking: Reported on 07/08/2018 05/25/18   Jiles Prows, MD     Allergies:    Allergies  Allergen Reactions  . Atorvastatin Other (See Comments)    Calf pain.    Social History:   Social History   Socioeconomic History  . Marital status: Married    Spouse name: Not on file  . Number of children: Not on file  . Years of education: Not on file  . Highest education level: Not on file  Occupational History  . Not on file  Social Needs  . Financial resource strain: Not on file  . Food insecurity:    Worry: Not on file    Inability: Not on file  . Transportation needs:    Medical: Not on file    Non-medical: Not on file  Tobacco Use  . Smoking status: Never Smoker  . Smokeless tobacco: Never Used  Substance and Sexual Activity  . Alcohol use: Yes    Alcohol/week: 0.0 standard drinks    Comment: occasional beer  . Drug use: No  . Sexual activity: Not on file  Lifestyle  . Physical activity:    Days per week: Not on file    Minutes per session: Not on file  . Stress: Not on file  Relationships  . Social connections:    Talks on phone: Not on file    Gets together: Not on file    Attends religious service: Not on file    Active member of club or organization: Not on file    Attends meetings of clubs or organizations: Not on file    Relationship status: Not on file  . Intimate partner violence:    Fear of current or ex partner: Not on file    Emotionally abused: Not on file    Physically abused: Not on file    Forced sexual activity:  Not on file  Other Topics Concern  . Not on file  Social History Narrative  . Not on file    Family History:   The patient's family history includes Bipolar disorder in his brother; Cancer in an other family member; Colon cancer in his mother; Lung cancer in his father.    ROS:  Please see the history of present illness.  All other ROS reviewed and negative.     Physical Exam/Data:   Vitals:   07/08/18 1030 07/08/18 1045  07/08/18 1100 07/08/18 1115  BP: 112/76 134/71 123/74 135/71  Pulse: 64 60    Resp: 14 13 (!) 26 16  Temp:      TempSrc:      SpO2: 96% 96%    Weight:      Height:       No intake or output data in the 24 hours ending 07/08/18 1255 Last 3 Weights 07/08/2018 06/21/2018 03/29/2018  Weight (lbs) 174 lb 180 lb 6.4 oz 175 lb  Weight (kg) 78.926 kg 81.829 kg 79.379 kg  Some encounter information is confidential and restricted. Go to Review Flowsheets activity to see all data.     Body mass index is 23.6 kg/m.  General:  Well nourished, well developed, in no acute distress HEENT: normal Neck: no JVD Vascular: No carotid bruits Cardiac:  normal S1, S2; RRR; no murmur  Lungs:  clear to auscultation bilaterally, no wheezing, rhonchi or rales  Abd: soft, nontender, no hepatomegaly  Ext: no edema Musculoskeletal:  No deformities, BUE and BLE strength normal and equal Skin: warm and dry  Neuro:  CNs 2-12 intact, no focal abnormalities noted Psych:  Normal affect    EKG:  The ECG that was done was personally reviewed and demonstrates normal sinus rhythm.  Relevant CV Studies:  24 Hour Holter: Underlying rhythm is sinus. Average heart rate 74, minimum 52, max 123. Rare PACs and PVCs. No significant arrhythmia noted.  Echo 08/28/17: Study Conclusions  - Left ventricle: The cavity size was normal. There was mild concentric hypertrophy. Systolic function was normal. The estimated ejection fraction was in the range of 55% to 65%. Wall motion was normal;  there were no regional wall motion abnormalities. Doppler parameters are consistent with abnormal left ventricular relaxation (grade 1 diastolic dysfunction). - Aortic valve: There was mild to moderate regurgitation directed eccentrically to the anterior leaflet of the mitral valve. - Left atrium: The atrium was normal in size. - Right ventricle: Systolic function was normal. - Pulmonary arteries: Systolic pressure was within the normal range.  Exercise Myoview 06/26/16: Nuclear stress EF: 49%. Asynchronous contraction.  The left ventricular ejection fraction is mildly decreased (45-54%).  There was no ST segment deviation noted during stress.  Defect 1: There is a small defect of mild severity present in the basal inferior location. Likely diaphragmatic attenuation artifact.  This is a low risk study. No ischemia identified  Coronary CT-A 05/15/17: IMPRESSION: 1. Coronary artery calcium score 361 Agatston units, placing the patient in the 78th percentile for age and gender, suggesting intermediate to high risk for future cardiac events.  2. Extensive calcified plaque in the proximal LAD, possible moderate stenosis though blooming artifact may make the disease look worse than it actually is.  FFR 0.83 in mid LAD. I do no think there is hemodynamically significant disease in the LAD. The LCx and RCA have no hemodynamically significant disease.  Laboratory Data:  Chemistry Recent Labs  Lab 07/08/18 1040  NA 141  K 3.9  CL 115*  CO2 22  GLUCOSE 79  BUN 27*  CREATININE 1.27*  CALCIUM 8.8*  GFRNONAA 58*  GFRAA >60  ANIONGAP 4*    No results for input(s): PROT, ALBUMIN, AST, ALT, ALKPHOS, BILITOT in the last 168 hours. Hematology Recent Labs  Lab 07/08/18 1040  WBC 4.8  RBC 4.59  HGB 13.3  HCT 41.0  MCV 89.3  MCH 29.0  MCHC 32.4  RDW 13.3  PLT 101*   Cardiac EnzymesNo results for input(s): TROPONINI  in the last 168 hours.  Recent Labs  Lab  07/08/18 1041  TROPIPOC 0.01    BNPNo results for input(s): BNP, PROBNP in the last 168 hours.  DDimer No results for input(s): DDIMER in the last 168 hours.  Radiology/Studies:  Dg Chest 2 View  Result Date: 07/08/2018 CLINICAL DATA:  Chest pain and chest tightness. EXAM: CHEST - 2 VIEW COMPARISON:  Chest CT 05/15/2017 FINDINGS: Cardiomediastinal silhouette is normal. Mediastinal contours appear intact. There is no evidence of focal airspace consolidation, pleural effusion or pneumothorax. Osseous structures are without acute abnormality. Soft tissues are grossly normal. IMPRESSION: No active cardiopulmonary disease. Electronically Signed   By: Fidela Salisbury M.D.   On: 07/08/2018 11:10    Assessment and Plan:   1. 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 Hyperlipidemia - 12/23/2017: Cholesterol, Total 131; HDL 35; LDL Calculated 74; Triglycerides 110 - [facility] has denied vascepa and PCSK9 inhibitors - on crestor 10 mg,increased from 5 mg - need repeat lipids, may not need vascepa or PCSK9 inhibitor now that he is tolerating crestor   3. Mild to moderate aortic regurgitation  - stable on recent echo   4. Chronic diastolic heart failure, grade 1 DD - euvolemic on exam - not on a diuretic regimen at home - continue toprol    Severity of Illness: The appropriate patient status for this patient is INPATIENT. Inpatient status is judged to be reasonable and necessary in order to provide the required intensity of service to ensure the patient's safety. The patient's presenting  symptoms, physical exam findings, and initial radiographic and laboratory data in the context of their chronic comorbidities is felt to place them at high risk for further clinical deterioration. Furthermore, it is not anticipated that the patient will be medically stable for discharge from the hospital within 2 midnights of admission. The following factors support the patient status of inpatient.   " The patient's presenting symptoms include unstable angina. " The worrisome physical exam findings include chest pain. " The initial radiographic and laboratory data are worrisome because of CTA with calcium. " The chronic co-morbidities include coronary calcification, HLD.   * I certify that at the point of admission it is my clinical judgment that the patient will require inpatient hospital care spanning beyond 2 midnights from the point of admission due to high intensity of service, high risk for further deterioration and high frequency of surveillance required.*    For questions or updates, please contact [place] Please consult [email or web address] for contact info under        Signed, Ledora Bottcher, [place]  07/08/2018 12:55 PM

## 2018-07-08 NOTE — Progress Notes (Signed)
ANTICOAGULATION CONSULT NOTE - Initial Consult  Pharmacy Consult for heparin Indication: chest pain/ACS   Patient Measurements: Height: 6' (182.9 cm) Weight: 174 lb (78.9 kg) IBW/kg (Calculated) : 77.6 Heparin Dosing Weight: 79 kg  Vital Signs: Temp: 97.9 F (36.6 C) (03/12 1020) Temp Source: Oral (03/12 1020) BP: 135/71 (03/12 1115) Pulse Rate: 60 (03/12 1045)  Labs: Recent Labs    07/08/18 1040  HGB 13.3  HCT 41.0  PLT 101*  CREATININE 1.27*    Assessment: Admitted with chest pain, he has a history of of dyslipidemia and hypertension. He is not on any anticoagulation prior to admission. He will be placed on a heparin infusion for ACS. SCr 1.27, lytes ok, h/h ok, plts 101.   Goal of Therapy:  Heparin level 0.3-0.7 units/ml Monitor platelets by anticoagulation protocol: Yes   Plan:  -Heparin bolus 4000 units x1 then 1000 units/hr -Daily HL, CBC -Check level in 6 hours   Harvel Quale 07/08/2018,12:37 PM

## 2018-07-08 NOTE — Progress Notes (Signed)
Received pt alert and oriented X4, with complaint of chest heaviness.  IVF infusing with Heparin.   Pt on monitor and consent signed.  Pt to procedure room for cath procedure.

## 2018-07-08 NOTE — ED Notes (Signed)
ED TO INPATIENT HANDOFF REPORT  ED Nurse Name and Phone #: Burman Nieves [phone]  S Name/Age/Gender [name] 68 y.o. male Room/Bed: H011C/H011C  Code Status   Code Status: Full Code  Home/SNF/Other Home Patient oriented to: self, place, time and situation Is this baseline? Yes   Triage Complete: Triage complete  Chief Complaint Chest Pain  Triage Note Pt arrives via EMS where pt reports sudden onset chest pressure with nausea, diaphoresis, SOB. 324 ASA and 0.4 nitro given. Pain went from 8, 5 to 4 after nitro. Pt has known 55% blockage. Pt now has numbness to left arm.   Allergies Allergies  Allergen Reactions  . Atorvastatin Other (See Comments)    Calf pain.    Level of Care/Admitting Diagnosis ED Disposition    ED Disposition Condition Comment   Admit  Hospital Area: [facility] [[id number]]  Level of Care: Telemetry Cardiac [103]  Diagnosis: Unstable angina [facility]) [[id number]]  Admitting Physician: Pixie Casino [phone]  Attending Physician: Pixie Casino [phone]  Estimated length of stay: past midnight tomorrow  Certification:: I certify this patient will need inpatient services for at least 2 midnights  PT Class (Do Not Modify): Inpatient [101]  PT Acc Code (Do Not Modify): Private [1]       B Medical/Surgery History Past Medical History:  Diagnosis Date  . Aortic insufficiency   . Arthritis   . Asthma   . Cervical disc disease   . Depression   . Diastolic dysfunction 3/55/7322  . Hyperlipidemia 08/24/2015  . Hypertension   . Lumbar disc disease   . Psoriasis    Past Surgical History:  Procedure Laterality Date  . CHOLECYSTECTOMY  2012  . HERNIA REPAIR    . PAROTIDECTOMY Right 1992  . PROSTATE SURGERY  2002, 2004  . SINOSCOPY    . SINUS EXPLORATION  2013  . SPINE SURGERY  2013   L C7-T1 laminectomy and discectomy  . TONSILLECTOMY       A IV Location/Drains/Wounds Patient Lines/Drains/Airways Status   Active  Line/Drains/Airways    Name:   Placement date:   Placement time:   Site:   Days:   Peripheral IV 07/08/18 Right Antecubital   07/08/18    1024    Antecubital   less than 1   Peripheral IV 07/08/18 Right Forearm   07/08/18    1318    Forearm   less than 1          Intake/Output Last 24 hours No intake or output data in the 24 hours ending 07/08/18 1610  Labs/Imaging Results for orders placed or performed during the hospital encounter of 07/08/18 (from the past 48 hour(s))  Basic metabolic panel     Status: Abnormal   Collection Time: 07/08/18 10:40 AM  Result Value Ref Range   Sodium 141 135 - 145 mmol/L   Potassium 3.9 3.5 - 5.1 mmol/L   Chloride 115 (H) 98 - 111 mmol/L   CO2 22 22 - 32 mmol/L   Glucose, Bld 79 70 - 99 mg/dL   BUN 27 (H) 8 - 23 mg/dL   Creatinine, Ser 1.27 (H) 0.61 - 1.24 mg/dL   Calcium 8.8 (L) 8.9 - 10.3 mg/dL   GFR calc non Af Amer 58 (L) >60 mL/min   GFR calc Af Amer >60 >60 mL/min   Anion gap 4 (L) 5 - 15    Comment: Performed at [facility], [address] [place], [place] [address]  CBC     Status: Abnormal   Collection Time: 07/08/18 10:40 AM  Result Value Ref Range   WBC 4.8 4.0 - 10.5 K/uL   RBC 4.59 4.22 - 5.81 MIL/uL   Hemoglobin 13.3 13.0 - 17.0 g/dL   HCT 41.0 39.0 - 52.0 %   MCV 89.3 80.0 - 100.0 fL   MCH 29.0 26.0 - 34.0 pg   MCHC 32.4 30.0 - 36.0 g/dL   RDW 13.3 11.5 - 15.5 %   Platelets 101 (L) 150 - 400 K/uL    Comment: REPEATED TO VERIFY PLATELET COUNT CONFIRMED BY SMEAR Immature Platelet Fraction may be clinically indicated, consider ordering this additional test [id number]    nRBC 0.0 0.0 - 0.2 %    Comment: Performed at [facility], [place] [address], [place], [place] [address]  I-stat troponin, ED     Status: None   Collection Time: 07/08/18 10:41 AM  Result Value Ref Range   Troponin i, poc 0.01 0.00 - 0.08 ng/mL   Comment 3            Comment: Due to the release kinetics of cTnI, a negative result within the  first hours of the onset of symptoms does not rule out myocardial infarction with certainty. If myocardial infarction is still suspected, repeat the test at appropriate intervals.   Troponin I - Now Then Q6H     Status: None   Collection Time: 07/08/18  3:14 PM  Result Value Ref Range   Troponin I <0.03 <0.03 ng/mL    Comment: Performed at [facility], [place] [address] [place], [place] [address]   Dg Chest 2 View  Result Date: 07/08/2018 CLINICAL DATA:  Chest pain and chest tightness. EXAM: CHEST - 2 VIEW COMPARISON:  Chest CT 05/15/2017 FINDINGS: Cardiomediastinal silhouette is normal. Mediastinal contours appear intact. There is no evidence of focal airspace consolidation, pleural effusion or pneumothorax. Osseous structures are without acute abnormality. Soft tissues are grossly normal. IMPRESSION: No active cardiopulmonary disease. Electronically Signed   By: Fidela Salisbury M.D.   On: 07/08/2018 11:10    Pending Labs Unresulted Labs (From admission, onward)    Start     Ordered   07/09/18 0500  Heparin level (unfractionated)  Daily,   R     07/08/18 1254   07/09/18 0500  CBC  Daily,   R     07/08/18 1254   07/09/18 0347  Basic metabolic panel  Tomorrow morning,   R     07/08/18 1457   07/09/18 0500  CBC  Tomorrow morning,   R     07/08/18 1457   07/09/18 0500  Lipid panel  Tomorrow morning,   R     07/08/18 1457   07/08/18 2000  Heparin level (unfractionated)  Once-Timed,   R     07/08/18 1254   07/08/18 1457  HIV antibody (Routine Testing)  Once,   R     07/08/18 1457   07/08/18 1457  Troponin I - Now Then Q6H  Now then every 6 hours,   STAT     07/08/18 1457          Vitals/Pain Today's Vitals   07/08/18 1415 07/08/18 1430 07/08/18 1445 07/08/18 1507  BP: 125/83 135/79 (!) 145/82 138/78  Pulse: (!) 56 64 65 60  Resp: 16 12 18 16   Temp:      TempSrc:      SpO2: 95% 97% 98% 98%  Weight:  Height:      PainSc:        Isolation Precautions No  active isolations  Medications Medications  heparin ADULT infusion 100 units/mL (25000 units/258mL sodium chloride 0.45%) (1,000 Units/hr Intravenous New Bag/Given 07/08/18 1319)  aspirin EC tablet 81 mg (has no administration in time range)  metoprolol succinate (TOPROL-XL) 24 hr tablet 12.5 mg (has no administration in time range)  rosuvastatin (CRESTOR) tablet 5 mg (has no administration in time range)  lamoTRIgine (LAMICTAL) tablet 400 mg (has no administration in time range)  albuterol (PROVENTIL HFA;VENTOLIN HFA) 108 (90 Base) MCG/ACT inhaler 1 puff (has no administration in time range)  fluticasone furoate-vilanterol (BREO ELLIPTA) 200-25 MCG/INH 1 puff (1 puff Inhalation Not Given 07/08/18 1502)  loratadine (CLARITIN) tablet 10 mg (has no administration in time range)  pantoprazole (PROTONIX) EC tablet 40 mg (has no administration in time range)  aspirin EC tablet 81 mg (has no administration in time range)  nitroGLYCERIN (NITROSTAT) SL tablet 0.4 mg (has no administration in time range)  acetaminophen (TYLENOL) tablet 650 mg (has no administration in time range)  ondansetron (ZOFRAN) injection 4 mg (has no administration in time range)  aspirin chewable tablet 81 mg (has no administration in time range)  0.9% sodium chloride infusion (has no administration in time range)    Followed by  0.9% sodium chloride infusion (has no administration in time range)  heparin bolus via infusion 4,000 Units (4,000 Units Intravenous Bolus from Bag 07/08/18 1319)    Mobility walks Low fall risk   Focused Assessments Cardiac Assessment Handoff:  Cardiac Rhythm: Normal sinus rhythm Lab Results  Component Value Date   TROPONINI <0.03 07/08/2018   Lab Results  Component Value Date   DDIMER 0.34 05/09/2017   Does the Patient currently have chest pain? No     R Recommendations: See Admitting Provider Note  Report given to:   Additional Notes: cath today

## 2018-07-08 NOTE — Interval H&P Note (Signed)
History and Physical Interval Note:  07/08/2018 5:26 PM  Arthur Moore  has presented today for cardiac cath with the diagnosis of unstable angina.  The various methods of treatment have been discussed with the patient and family. After consideration of risks, benefits and other options for treatment, the patient has consented to  Procedure(s): LEFT HEART CATH AND CORONARY ANGIOGRAPHY (N/A) as a surgical intervention.  The patient's history has been reviewed, patient examined, no change in status, stable for surgery.  I have reviewed the patient's chart and labs.  Questions were answered to the patient's satisfaction.    Cath Lab Visit (complete for each Cath Lab visit)  Clinical Evaluation Leading to the Procedure:   ACS: No.  Non-ACS:    Anginal Classification: CCS III  Anti-ischemic medical therapy: Minimal Therapy (1 class of medications)  Non-Invasive Test Results: No non-invasive testing performed  Prior CABG: No previous CABG    Lauree Chandler

## 2018-07-08 NOTE — Procedures (Signed)
Echo attempted in ED. Patient taken to cath lab.

## 2018-07-08 NOTE — ED Provider Notes (Addendum)
[facility] EMERGENCY DEPARTMENT Provider Note   CSN: [id number] Arrival date & time: 07/08/18  1013    History   Chief Complaint Chief Complaint  Patient presents with  . Chest Pain    HPI Arthur Moore is a 68 y.o. male.  HPI: A 68 year old patient with a history of peripheral artery disease, hypertension and hypercholesterolemia presents for evaluation of chest pain. Initial onset of pain was approximately 1-3 hours ago. The patient's chest pain is described as heaviness/pressure/tightness, is worse with exertion and is relieved by nitroglycerin. The patient complains of nausea and reports some diaphoresis. The patient's chest pain is not middle- or left-sided, is not well-localized, is not sharp and does not radiate to the arms/jaw/neck. The patient has no history of stroke, has not smoked in the past 90 days, denies any history of treated diabetes, has no relevant family history of coronary artery disease (first degree relative at less than age 39) and does not have an elevated BMI (>=30).   HPI  Past Medical History:  Diagnosis Date  . Aortic insufficiency   . Arthritis   . Asthma   . Cervical disc disease   . Depression   . Diastolic dysfunction 2/35/5732  . Hyperlipidemia 08/24/2015  . Hypertension   . Lumbar disc disease   . Psoriasis     Patient Active Problem List   Diagnosis Date Noted  . Chronic cough 06/21/2018  . Thrombocytopenia ([place]) 12/01/2016  . Transaminitis 12/01/2016  . Diastolic dysfunction 20/25/4270  . 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 Hyperlipidemia 08/24/2015  . Arthritis   . Depression   . Hypertension   . Aortic insufficiency   . Psoriasis   . Cervical disc disease   . Lumbar disc disease   . Asthma 01/06/2015  . Psoriatic arthritis ([place]) 01/06/2015  . Allergic rhinoconjunctivitis 01/06/2015  . GERD (gastroesophageal reflux disease) 01/06/2015  .  Laryngopharyngeal reflux (LPR) 01/06/2015    Past Surgical History:  Procedure Laterality Date  . CHOLECYSTECTOMY  2012  . HERNIA REPAIR    . PAROTIDECTOMY Right 1992  . PROSTATE SURGERY  2002, 2004  . SINOSCOPY    . SINUS EXPLORATION  2013  . SPINE SURGERY  2013   L C7-T1 laminectomy and discectomy  . TONSILLECTOMY          Home Medications    Prior to Admission medications   Medication Sig Start Date End Date Taking? Authorizing Provider  albuterol (PROVENTIL HFA;VENTOLIN HFA) 108 (90 Base) MCG/ACT inhaler Inhale 1 puff into the lungs as needed for wheezing or shortness of breath.    [provider]  aspirin EC 81 MG tablet Take 81 mg by mouth daily.    [provider]  benzonatate (TESSALON) 100 MG capsule Take 1 capsule (100 mg total) by mouth every 8 (eight) hours as needed for cough. 05/25/18   Kozlow, Donnamarie Poag, MD  budesonide-formoterol (SYMBICORT) 160-4.5 MCG/ACT inhaler Inhale 2 puffs into the lungs 2 (two) times daily.    [provider]  cetirizine (ZYRTEC) 10 MG tablet Take 10 mg by mouth daily as needed for allergies.    [provider]  Cholecalciferol (VITAMIN D3) 5000 units TABS Take 1 tablet by mouth daily.    [provider]  Cranberry 1000 MG CAPS Take by mouth.    [provider]  lamoTRIgine (LAMICTAL) 200 MG tablet Take 200 mg by mouth daily.    [provider]  metoprolol  succinate (TOPROL XL) 25 MG 24 hr tablet Take 12.5 mg by mouth 2 (two) times daily.    [provider]  Misc Natural Products (OSTEO BI-FLEX ADV JOINT SHIELD PO) Take 1 tablet by mouth daily.    [provider]  Multiple Vitamin (MULTIVITAMIN) capsule Take 1 capsule by mouth daily.    [provider]  Omega-3 Fatty Acids (FISH OIL) 1000 MG CAPS Take by mouth.    [provider]  pantoprazole (PROTONIX) 40 MG tablet Take 1 tablet (40 mg total) by mouth daily. 06/09/17   Kozlow, Donnamarie Poag, MD   Probiotic Product (PROBIOTIC DAILY PO) Take 1 tablet by mouth daily.    [provider]  rosuvastatin (CRESTOR) 5 MG tablet Take 1 tablet (5 mg total) by mouth daily. 10/07/17 05/25/18  Skeet Latch, MD    Family History Family History  Problem Relation Age of Onset  . Colon cancer Mother   . Lung cancer Father   . Cancer Other   . Bipolar disorder Brother     Social History Social History   Tobacco Use  . Smoking status: Never Smoker  . Smokeless tobacco: Never Used  Substance Use Topics  . Alcohol use: Yes    Alcohol/week: 0.0 standard drinks    Comment: occasional beer  . Drug use: No     Allergies   Atorvastatin   Review of Systems Review of Systems  All other systems reviewed and are negative.    Physical Exam Updated Vital Signs BP 135/71   Pulse 60   Temp 97.9 F (36.6 C) (Oral)   Resp 16   Ht 6' (1.829 m)   Wt 78.9 kg   SpO2 96%   BMI 23.60 kg/m   Physical Exam Vitals signs and nursing note reviewed.  Constitutional:      Appearance: He is well-developed.  HENT:     Head: Normocephalic and atraumatic.  Neck:     Musculoskeletal: Normal range of motion.  Cardiovascular:     Rate and Rhythm: Normal rate and regular rhythm.     Heart sounds: Normal heart sounds.  Pulmonary:     Effort: Pulmonary effort is normal. No respiratory distress.     Breath sounds: Normal breath sounds.  Abdominal:     General: There is no distension.     Palpations: Abdomen is soft.     Tenderness: There is no abdominal tenderness.  Musculoskeletal: Normal range of motion.  Skin:    General: Skin is warm and dry.  Neurological:     Mental Status: He is alert and oriented to person, place, and time.  Psychiatric:        Judgment: Judgment normal.      ED Treatments / Results  Labs (all labs ordered are listed, but only abnormal results are displayed) Labs Reviewed  BASIC METABOLIC PANEL - Abnormal; Notable for the following components:       Result Value   Chloride 115 (*)    BUN 27 (*)    Creatinine, Ser 1.27 (*)    Calcium 8.8 (*)    GFR calc non Af Amer 58 (*)    Anion gap 4 (*)    All other components within normal limits  CBC - Abnormal; Notable for the following components:   Platelets 101 (*)    All other components within normal limits  I-STAT TROPONIN, ED    Coronary CT-A 05/15/17: IMPRESSION: 1. Coronary artery calcium score 361 Agatston units, placing the patient  in the 78th percentile for age and gender, suggesting intermediate to high risk for future cardiac events.  2. Extensive calcified plaque in the proximal LAD, possible moderate stenosis though blooming artifact may make the disease look worse than it actually is.  FFR 0.83 in mid LAD. I do no think there is hemodynamically significant disease in the LAD. The LCx and RCA have no hemodynamically significant disease.    EKG EKG Interpretation  Date/Time:  Thursday July 08 2018 10:45:14 EDT Ventricular Rate:  62 PR Interval:    QRS Duration: 99 QT Interval:  405 QTC Calculation: 412 R Axis:   44 Text Interpretation:  Sinus rhythm No significant change was found Confirmed by Jola Schmidt [id number] on 07/08/2018 11:04:38 AM   Radiology Dg Chest 2 View  Result Date: 07/08/2018 CLINICAL DATA:  Chest pain and chest tightness. EXAM: CHEST - 2 VIEW COMPARISON:  Chest CT 05/15/2017 FINDINGS: Cardiomediastinal silhouette is normal. Mediastinal contours appear intact. There is no evidence of focal airspace consolidation, pleural effusion or pneumothorax. Osseous structures are without acute abnormality. Soft tissues are grossly normal. IMPRESSION: No active cardiopulmonary disease. Electronically Signed   By: Fidela Salisbury M.D.   On: 07/08/2018 11:10    Procedures .Critical Care Performed by: Jola Schmidt, MD Authorized by: Jola Schmidt, MD   Critical care provider statement:    Critical care time (minutes):  31   Critical care was  time spent personally by me on the following activities:  Discussions with consultants, evaluation of patient's response to treatment, examination of patient, ordering and performing treatments and interventions, ordering and review of laboratory studies, ordering and review of radiographic studies, pulse oximetry, re-evaluation of patient's condition, obtaining history from patient or surrogate and review of old charts   (including critical care time)  Medications Ordered in ED Medications - No data to display   Initial Impression / Assessment and Plan / ED Course  I have reviewed the triage vital signs and the nursing notes.  Pertinent labs & imaging results that were available during my care of the patient were reviewed by me and considered in my medical decision making (see chart for details).     HEAR Score: 7   High risk CT angios performed in January 2019.  Here with symptoms concerning for unstable angina.  Still some mild chest discomfort at this time although significantly improved as compared to the chest discomfort/pressure he was feeling while exerting himself this morning.  Aspirin prior to arrival.  Improved with nitroglycerin.  Patient undergo cardiology consultation here in the emergency department for further recommendations.    Final Clinical Impressions(s) / ED Diagnoses   Final diagnoses:  Precordial chest pain    ED Discharge Orders    None       Jola Schmidt, MD 07/08/18 0160    Jola Schmidt, MD 07/08/18 1226

## 2018-07-08 NOTE — ED Triage Notes (Signed)
Pt arrives via EMS where pt reports sudden onset chest pressure with nausea, diaphoresis, SOB. 324 ASA and 0.4 nitro given. Pain went from 8, 5 to 4 after nitro. Pt has known 55% blockage. Pt now has numbness to left arm.

## 2018-07-09 ENCOUNTER — Encounter (HOSPITAL_COMMUNITY): Payer: Self-pay | Admitting: Cardiovascular Disease

## 2018-07-09 ENCOUNTER — Other Ambulatory Visit (HOSPITAL_COMMUNITY): Payer: Self-pay

## 2018-07-09 DIAGNOSIS — E782 Mixed hyperlipidemia: Secondary | ICD-10-CM | POA: Diagnosis not present

## 2018-07-09 DIAGNOSIS — N182 Chronic kidney disease, stage 2 (mild): Secondary | ICD-10-CM | POA: Insufficient documentation

## 2018-07-09 DIAGNOSIS — R0789 Other chest pain: Secondary | ICD-10-CM

## 2018-07-09 DIAGNOSIS — I25110 Atherosclerotic heart disease of native coronary artery with unstable angina pectoris: Secondary | ICD-10-CM | POA: Diagnosis not present

## 2018-07-09 DIAGNOSIS — I2510 Atherosclerotic heart disease of native coronary artery without angina pectoris: Secondary | ICD-10-CM

## 2018-07-09 DIAGNOSIS — I351 Nonrheumatic aortic (valve) insufficiency: Secondary | ICD-10-CM | POA: Diagnosis not present

## 2018-07-09 LAB — CBC
HCT: 40.2 % (ref 39.0–52.0)
Hemoglobin: 13.6 g/dL (ref 13.0–17.0)
MCH: 29.5 pg (ref 26.0–34.0)
MCHC: 33.8 g/dL (ref 30.0–36.0)
MCV: 87.2 fL (ref 80.0–100.0)
Platelets: 99 10*3/uL — ABNORMAL LOW (ref 150–400)
RBC: 4.61 MIL/uL (ref 4.22–5.81)
RDW: 13.7 % (ref 11.5–15.5)
WBC: 5.8 10*3/uL (ref 4.0–10.5)
nRBC: 0 % (ref 0.0–0.2)

## 2018-07-09 LAB — LIPID PANEL
Cholesterol: 162 mg/dL (ref 0–200)
HDL: 40 mg/dL — ABNORMAL LOW (ref >40)
LDL Cholesterol: 97 mg/dL (ref 0–99)
Total CHOL/HDL Ratio: 4.1 RATIO
Triglycerides: 124 mg/dL (ref <150)
VLDL: 25 mg/dL (ref 0–40)

## 2018-07-09 LAB — BASIC METABOLIC PANEL
Anion gap: 7 (ref 5–15)
BUN: 19 mg/dL (ref 8–23)
CO2: 22 mmol/L (ref 22–32)
Calcium: 8.6 mg/dL — ABNORMAL LOW (ref 8.9–10.3)
Chloride: 111 mmol/L (ref 98–111)
Creatinine, Ser: 1.33 mg/dL — ABNORMAL HIGH (ref 0.61–1.24)
GFR calc Af Amer: >60 mL/min (ref >60)
GFR calc non Af Amer: 55 mL/min — ABNORMAL LOW (ref >60)
Glucose, Bld: 95 mg/dL (ref 70–99)
Potassium: 4.1 mmol/L (ref 3.5–5.1)
Sodium: 140 mmol/L (ref 135–145)

## 2018-07-09 LAB — HIV ANTIBODY (ROUTINE TESTING W REFLEX): HIV Screen 4th Generation wRfx: NONREACTIVE

## 2018-07-09 NOTE — Care Management Obs Status (Signed)
[facility] NOTIFICATION   Patient Details  Name: Arthur Moore MRN: [id number] Date of Birth: 1950/11/17   Medicare Observation Status Notification Given:  Yes    Bethena Roys, RN 07/09/2018, 10:24 AM

## 2018-07-09 NOTE — Care Management CC44 (Signed)
Condition Code 44 Documentation Completed  Patient Details  Name: Devantae Babe MRN: [id number] Date of Birth: 09-18-1950   Condition Code 44 given:  Yes Patient signature on Condition Code 44 notice:  Yes Documentation of 2 MD's agreement:  Yes Code 44 added to claim:  Yes    Bethena Roys, RN 07/09/2018, 10:25 AM

## 2018-07-09 NOTE — Progress Notes (Signed)
Pt discharged to home, education completed, verbalized understanding.

## 2018-07-09 NOTE — Discharge Instructions (Signed)
Angina  Angina is a very bad discomfort or pain in the chest, neck, or arm. Angina may cause the following symptoms in your chest:  Crushing or squeezing pain. Pain might last for more than a few minutes at a time. Or, it may stop and come back (recur) over a few minutes.  Tightness.  Fullness.  Pressure.  Heaviness.  Some people also have:  Pain in the arms, neck, jaw, or back.  Heartburn or indigestion for no reason.  Shortness of breath.  An upset stomach (nausea).  Sudden cold sweats.  Women and people with diabetes may have other less common symptoms such as:  Feeling tired (fatigue).  Feeling nervous or worried for no reason.  Feeling weak for no reason.  Dizziness or fainting.  Follow these instructions at home:  Medicines  Take over-the-counter and prescription medicines only as told by your doctor.  Do not take these medicines unless your doctor says that you can:  NSAIDs. These include:  Ibuprofen.  Naproxen.  Celecoxib.  Vitamin supplements that have vitamin A, vitamin E, or both.  Hormone therapy that contains estrogen with or without progestin.  Eating and drinking    Eat a heart-healthy diet that includes:  Plenty of fresh fruits and vegetables.  Whole grains.  Lowfat (lean) protein.  Lowfat dairy products.  Work with a diet and nutrition specialist (dietitian) as told by your doctor. This person can help you make healthy food choices.  Follow other instructions from your doctor about eating or drinking restrictions.  Activity  Follow an exercise program that your doctor tells you.  Return to your normal activities as told by your doctor. Ask your doctor what activities are safe for you.  When you feel tired, take a break. Plan breaks if you know you are going to feel tired.  Lifestyle    Do not use any products that contain nicotine or tobacco. This includes cigarettes and e-cigarettes. If you need help quitting, ask your doctor.  If your doctor says you can drink alcohol, limit your drinking to:  0-1  drink a day for women.  0-2 drinks a day for men.  Be aware of how much alcohol is in your drink. In the [place] 1 drink equals to:  12 oz of beer.  5 oz of wine.  1 oz of hard liquor.  General instructions  Stay at a healthy weight. If your doctor tells you to do so, work with him or her to lose weight.  Learn to deal with stress. If you need help, ask your doctor.  Take a depression screening test to see if you are at risk for depression. If you feel depressed, talk with your doctor.  Work with your doctor to manage any other health conditions that you have. These may include diabetes or high blood pressure.  Keep your vaccines up to date. Get a flu shot every year.  Keep all follow-up visits as told by your doctor. This is important.  Get help right away if:  You have pain in your chest, neck, arm, jaw, stomach, or back, and the pain:  Lasts more than a few minutes.  Comes back.  Is very painful.  Comes more often.  Does not get better after you take medicine under your tongue (sublingual nitroglycerin).  You have any of these problems for no reason:  Heartburn, or indigestion.  Sweating a lot.  Shortness of breath.  Trouble breathing.  Feeling sick to your stomach.  Throwing up.    Feeling more tired than usual.  Feeling nervous or worrying more than usual.  Weakness.  Watery poop (diarrhea).  You are suddenly dizzy or light-headed.  You pass out (faint).  These symptoms may be an emergency. Do not wait to see if the symptoms will go away. Get medical help right away. Call your local emergency services (911 in the [place] Do not drive yourself to the hospital.  Summary  Angina is very bad discomfort or pain in the chest, neck, or arm.  Angina may feel like a crushing or squeezing pain in the chest. It may feel like tightness, pressure, fullness, or heaviness in the chest.  Women or people with diabetes may have different symptoms from men, such as feeling nervous, being worried, being weak for no reason, or feeling  tired.  Take medicines only as told by your doctor.  You should eat a heart-healthy diet and follow an exercise program.  This information is not intended to replace advice given to you by your health care provider. Make sure you discuss any questions you have with your health care provider.  Document Released: 10/01/2007 Document Revised: 05/29/2017 Document Reviewed: 05/29/2017  Elsevier Interactive Patient Education  2019 [employer]

## 2018-07-09 NOTE — Progress Notes (Signed)
Progress Note  Patient Name: Arthur Moore Date of Encounter: 07/09/2018  Primary Cardiologist: Skeet Latch, MD  Subjective   Feeling well this morning. No further CP or SOB. Lot of questions about activity, Coronavirus.  Inpatient Medications    Scheduled Meds: . aspirin EC  81 mg Oral Daily  . fluticasone furoate-vilanterol  1 puff Inhalation Daily  . lamoTRIgine  400 mg Oral QHS  . loratadine  10 mg Oral Daily  . metoprolol succinate  12.5 mg Oral BID  . pantoprazole  40 mg Oral Daily  . rosuvastatin  5 mg Oral Daily  . sodium chloride flush  3 mL Intravenous Q12H   Continuous Infusions: . sodium chloride     PRN Meds: sodium chloride, acetaminophen, albuterol, nitroGLYCERIN, ondansetron (ZOFRAN) IV, sodium chloride flush   Vital Signs    Vitals:   07/08/18 2001 07/09/18 0000 07/09/18 0048 07/09/18 0519  BP: 120/72 130/73 122/69 109/66  Pulse: 64  60 62  Resp:      Temp: 97.7 F (36.5 C)  98.3 F (36.8 C) (!) 97.4 F (36.3 C)  TempSrc:    Oral  SpO2: 99%  94% 97%  Weight:      Height:        Intake/Output Summary (Last 24 hours) at 07/09/2018 0836 Last data filed at 07/08/2018 2358 Gross per 24 hour  Intake 1280 ml  Output 1600 ml  Net -320 ml   Last 3 Weights 07/08/2018 07/08/2018 06/21/2018  Weight (lbs) 173 lb 174 lb 180 lb 6.4 oz  Weight (kg) 78.472 kg 78.926 kg 81.829 kg  Some encounter information is confidential and restricted. Go to Review Flowsheets activity to see all data.     Telemetry    NSR - Personally Reviewed  Physical Exam   GEN: No acute distress.  HEENT: Normocephalic, atraumatic, sclera non-icteric. Neck: No JVD or bruits. Cardiac: RRR 2/6 diastolic murmur RUSB but also heard through precordium, no rubs or gallops.  Radials/DP/PT 1+ and equal bilaterally.  Respiratory: Clear to auscultation bilaterally. Breathing is unlabored. GI: Soft, nontender, non-distended, BS +x 4. MS: no deformity. Extremities: No clubbing  or cyanosis. No edema. Distal pedal pulses are 2+ and equal bilaterally. Right radial cath site without hematoma or ecchymosis; good pulse. Neuro:  AAOx3. Follows commands. Psych:  Responds to questions appropriately with a normal affect.  Labs    Chemistry Recent Labs  Lab 07/08/18 1040 07/09/18 0407  NA 141 140  K 3.9 4.1  CL 115* 111  CO2 22 22  GLUCOSE 79 95  BUN 27* 19  CREATININE 1.27* 1.33*  CALCIUM 8.8* 8.6*  GFRNONAA 58* 55*  GFRAA >60 >60  ANIONGAP 4* 7     Hematology Recent Labs  Lab 07/08/18 1040 07/09/18 0407  WBC 4.8 5.8  RBC 4.59 4.61  HGB 13.3 13.6  HCT 41.0 40.2  MCV 89.3 87.2  MCH 29.0 29.5  MCHC 32.4 33.8  RDW 13.3 13.7  PLT 101* 99*    Cardiac Enzymes Recent Labs  Lab 07/08/18 1514  TROPONINI <0.03    Recent Labs  Lab 07/08/18 1041  TROPIPOC 0.01     Radiology    Dg Chest 2 View  Result Date: 07/08/2018 CLINICAL DATA:  Chest pain and chest tightness. EXAM: CHEST - 2 VIEW COMPARISON:  Chest CT 05/15/2017 FINDINGS: Cardiomediastinal silhouette is normal. Mediastinal contours appear intact. There is no evidence of focal airspace consolidation, pleural effusion or pneumothorax. Osseous structures are without acute abnormality. Soft tissues  are grossly normal. IMPRESSION: No active cardiopulmonary disease. Electronically Signed   By: Fidela Salisbury M.D.   On: 07/08/2018 11:10   Patient Profile     68 y.o. male with coronary artery calcification by prior cardiac CT, mild-moderate aortic insufficiency (by echo 08/2017), hypertriglyceridemia, HTN, asthma, TIA, OSA not on CPAP, liver fibrosis, anxiety, depression, probable CKD stage II (prior Cr 1.2), chronic appearing thrombocytopenia who presented to [facility] with chest discomfort concerning for unstable angina. Cardiac cath yesterday showed mild nonobstructive disease, EF 50-55%.  Assessment & Plan    1. Chest pain concerning for unstable angina - etiology not clear.  Troponins negative x 2. Cath unrevealing, ?mild nonobstructive disease. He is not tachycardic, tachypneic or hypoxic. Feels well this AM.  2. CAD - continue risk factor modification. On low dose Crestor, consider increase to 10mg  daily - will review with MD. Prior myalgias w/ atorvastatin noted.  3. Aortic insufficiency - echo pending, consider obtaining as OP.  4. Essential HTN - controlled.  5. CKD II - Cr appears similar to baseline.  6. Thrombocytopenia - appears similar to 2019.  For questions or updates, please contact [place] Please consult [email or web address] for contact info under Cardiology/STEMI.  Signed, Charlie Pitter, PA-C 07/09/2018, 8:36 AM

## 2018-07-09 NOTE — Discharge Summary (Addendum)
Discharge Summary    Patient ID: Arthur Moore,  MRN: [id number], DOB/AGE: April 06, 1951 68 y.o.  Admit date: 07/08/2018 Discharge date: 07/09/2018  Primary Care Provider: Leighton Ruff Primary Cardiologist: Skeet Latch, MD Primary Electrophysiologist:  None  Discharge Diagnoses    Principal Problem:   Chest pain of uncertain etiology Active Problems:   Hypertension   Aortic insufficiency   Hyperlipidemia   Thrombocytopenia (HCC)   CAD (coronary artery disease)  Diagnostic Studies/Procedures    Cardiac Cath 07/08/18 Conclusion Dist RCA lesion is 20% stenosed.  Prox Cx lesion is 10% stenosed.  Dist LM to Ost LAD lesion is 20% stenosed.  Ost 1st Diag lesion is 50% stenosed.  The left ventricular systolic function is normal.  LV end diastolic pressure is normal.  The left ventricular ejection fraction is 50-55% by visual estimate.  There is no mitral valve regurgitation.  Prox LAD lesion is 20% stenosed.   1. Mild non-obstructive CAD 2. Low normal LV systolic function  Recommendations: Medical management of CAD   ____________     History of Present Illness     Arthur Moore is a 68 y.o. male with history of coronary artery calcification by prior cardiac CT, mild-moderate aortic insufficiency (by echo 08/2017), hypertriglyceridemia, HTN, asthma, TIA, OSA not on CPAP, liver fibrosis, anxiety, depression, probable CKD stage II (prior Cr 1.2), chronic appearing thrombocytopenia who presented to [facility] with chest discomfort concerning for unstable angina. It began 9am the day of admission. He is very active and plays pickleball 4-5 times per week. Yesterday morning he ate breakfast at 8AM and then played pickleball at 8:30 AM. He was only able to play one game because he felt weak and started to get substernal chest pain that felt like a pressure and tightness rated as a 8/10. The pain did not radiate, but he felt nauseous and was  diaphoretic (more than normal after a game). He was also short of breath. He received ASA and one SL nitro which relieved his pain to a 5/10. Heparin drip was started. In the ER he wanted to go home but was admitted for further evaluation.   Hospital Course    1. Chest pain concerning for unstable angina - Troponins negative x 2. He is not tachycardic, tachypneic or hypoxic. Cardiac cath yesterday showed mild nonobstructive disease, EF 50-55%, therefore, etiology not clear. He currently feels much better without recurrent symptoms. He has ambulated without difficulty. He will be discharged on prior home regimen.  2. CAD - continue risk factor modification.   3. HLD - He was on low dose Crestor prior to admission. Previously LDL got down to 74 but has risen to 97 this admission. He has prior myalgias with atorvastatin. Dr. Debara Pickett recommended to work on diet and resume exercise and continue current dose of Crestor. Will need to consider further statin titration vs addition of Vascepa as OP. At present time with triglycerides of 124 would likely presently not qualify for coverage for Vascepa although goal would be to get this number under 100. He apparently couldn't get this through the [place] previously.  4. Aortic insufficiency - mild-moderate by echo 08/2017. Asymptomatic today. We will defer inpatient echocardiogram for now and have him f/u as an outpatient to revisit timing of repeat.  5. Essential HTN - controlled.  6. CKD II - not previously formally called, but Cr appears similar to baseline. Ranging 1.2-1.3.  7. Thrombocytopenia - appears similar to 2019, recommended to f/u as OP.  Dr.  Hilty has seen and examined the patient today and feels he is stable for discharge. Post cath instructions outlined below. Will arrange 1 month f/u. _____________  Discharge Vitals Blood pressure 109/66, pulse 62, temperature (!) 97.4 F (36.3 C), temperature source Oral, resp. rate 18, height 6' (1.829 m),  weight 78.5 kg, SpO2 97 %.  Filed Weights   07/08/18 1020 07/08/18 1825  Weight: 78.9 kg 78.5 kg    Labs & Radiologic Studies    CBC Recent Labs    07/08/18 1040 07/09/18 0407  WBC 4.8 5.8  HGB 13.3 13.6  HCT 41.0 40.2  MCV 89.3 87.2  PLT 101* 99*   Basic Metabolic Panel Recent Labs    07/08/18 1040 07/09/18 0407  NA 141 140  K 3.9 4.1  CL 115* 111  CO2 22 22  GLUCOSE 79 95  BUN 27* 19  CREATININE 1.27* 1.33*  CALCIUM 8.8* 8.6*   Cardiac Enzymes Recent Labs    07/08/18 1514  TROPONINI <0.03   Fasting Lipid Panel Recent Labs    07/09/18 0407  CHOL 162  HDL 40*  LDLCALC 97  TRIG 124  CHOLHDL 4.1   _____________  Dg Chest 2 View  Result Date: 07/08/2018 CLINICAL DATA:  Chest pain and chest tightness. EXAM: CHEST - 2 VIEW COMPARISON:  Chest CT 05/15/2017 FINDINGS: Cardiomediastinal silhouette is normal. Mediastinal contours appear intact. There is no evidence of focal airspace consolidation, pleural effusion or pneumothorax. Osseous structures are without acute abnormality. Soft tissues are grossly normal. IMPRESSION: No active cardiopulmonary disease. Electronically Signed   By: Fidela Salisbury M.D.   On: 07/08/2018 11:10   Disposition   Pt is being discharged home today in good condition.  Follow-up Plans & Appointments    Follow-up Information    Lendon Colonel, NP Follow up.   Specialties:  Nurse Practitioner, Radiology, Cardiology Why:  [facility] - 08/11/18 at 10am. Please arrive 15 minutes prior to appointment to check in. Curt Bears is one of the nurse practitioners that works with Dr. Oval Linsey and her care team. We will decide at that visit if you need a follow-up echo (ultrasound). Contact information: [address] [place] [address] [phone]          Discharge Instructions    Diet - low sodium heart healthy   Complete by:  As directed    Discharge instructions   Complete by:  As directed    No driving  for 2 days. No lifting over 5 lbs for 1 week. No sexual activity for 1 week. Keep procedure site clean & dry. If you notice increased pain, swelling, bleeding or pus, call/return!  You may shower, but no soaking baths/hot tubs/pools for 1 week.    Benzonatate was removed from your list as you indicated you were not taking this.   Increase activity slowly   Complete by:  As directed       Discharge Medications   Allergies as of 07/09/2018      Reactions   Atorvastatin Other (See Comments)   Calf pain.      Medication List    STOP taking these medications   benzonatate 100 MG capsule Commonly known as:  TESSALON     TAKE these medications   albuterol 108 (90 Base) MCG/ACT inhaler Commonly known as:  PROVENTIL HFA;VENTOLIN HFA Inhale 1 puff into the lungs as needed for wheezing or shortness of breath.   aspirin EC 81 MG tablet Take 81 mg by  mouth daily.   budesonide-formoterol 160-4.5 MCG/ACT inhaler Commonly known as:  SYMBICORT Inhale 2 puffs into the lungs 2 (two) times daily.   cetirizine 10 MG tablet Commonly known as:  ZYRTEC Take 10 mg by mouth daily.   Cranberry 1000 MG Caps Take 1,000 mg by mouth daily.   Fish Oil 1000 MG Caps Take 2,400 mg by mouth daily.   lamoTRIgine 200 MG tablet Commonly known as:  LAMICTAL Take 400 mg by mouth daily.   multivitamin capsule Take 1 capsule by mouth daily.   OSTEO BI-FLEX ADV JOINT SHIELD PO Take 1 tablet by mouth daily.   pantoprazole 40 MG tablet Commonly known as:  PROTONIX Take 1 tablet (40 mg total) by mouth daily.   PROBIOTIC DAILY PO Take 1 tablet by mouth daily.   rosuvastatin 5 MG tablet Commonly known as:  CRESTOR Take 1 tablet (5 mg total) by mouth daily.   Toprol XL 25 MG 24 hr tablet Generic drug:  metoprolol succinate Take 12.5 mg by mouth 2 (two) times daily.   Vitamin D3 125 MCG (5000 UT) Tabs Take 5,000 Units by mouth daily.        Allergies:  Allergies  Allergen Reactions  .  Atorvastatin Other (See Comments)    Calf pain.    Outstanding Labs/Studies   N/a  Duration of Discharge Encounter   Greater than 30 minutes including physician time.  Signed, Charlie Pitter PA-C 07/09/2018, 10:27 AM

## 2018-07-26 ENCOUNTER — Telehealth: Payer: Self-pay | Admitting: Cardiovascular Disease

## 2018-07-26 ENCOUNTER — Telehealth: Payer: Self-pay

## 2018-07-26 MED ORDER — GLUCOSAMINE 1500 COMPLEX PO CAPS: 1.0000 | ORAL_CAPSULE | Freq: Every day | ORAL | 0 refills | Status: DC

## 2018-07-26 MED ORDER — ZINC GLUCONATE 50 MG PO TABS: 50.0000 mg | ORAL_TABLET | Freq: Every day | ORAL | Status: DC

## 2018-07-26 MED ORDER — MAGNESIUM OXIDE -MG SUPPLEMENT 200 MG PO TABS: 500.0000 mg | ORAL_TABLET | Freq: Every day | ORAL | 0 refills | Status: DC

## 2018-07-26 NOTE — Progress Notes (Addendum)
Virtual Visit via Video Note     Evaluation Performed:  Follow-up visit  This visit type was conducted due to national recommendations for restrictions regarding the COVID-19 Pandemic (e.g. social distancing).  This format is felt to be most appropriate for this patient at this time.  All issues noted in this document were discussed and addressed.  No physical exam was performed (except for noted visual exam findings with Telehealth visits).  See MyChart message from today for the patient's consent to telehealth for [facility]. .  Patient has given verbal consent to proceed with tele-visit and for insurance to be billed.  Date:  07/27/2018   ID:  Arthur Moore, DOB 1950/07/23, MRN [id number]  Patient Location:  [place] [address]   Provider location:   [place], [place] office   PCP:  Leighton Ruff, MD  Cardiologist:  Skeet Latch, MD  Electrophysiologist:  None   Chief Complaint:  Post hospital follow up  History of Present Illness:    Arthur Moore is a 68 y.o. male who presents via audio/video conferencing for a telehealth visit today.  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   He presented to [facility] ED for complaints of chest pain, while playing pickle ball He had associated nausea and was diaphoretic, with dyspnea. He was placed on heparin gtt, and ASA and admitted for observation. His troponin was found to be negative Cardiac cath as was negative for obstructive disease.c He was to continue home medications. He was to see Dr. Debara Pickett in the lipid clinic for consideration for Vescepa but does not qualify for coverage. Echo revealed mild to moderate aortic insufficiency.   He is seen today via web video. He has multiple questions, confusion on the results of his cath and  Medications,  along with other reasons for his episode. His wife if also joining him in this appointment and has multiple questions of her own concerning his diagnosis and test results.   He is asymptomatic and wants to go back to exercising. He has increased the rosuvastatin to 10 mg from 5 mg on his own, because his LDL is not at goal. He reviews his My Chart labs.   The patient does not have symptoms concerning for COVID-19 infection (fever, chills, cough, or new SHORTNESS OF BREATH).    Prior CV studies:   The following studies were reviewed today:  Conclusion-07/08/2018 Dist RCA lesion is 20% stenosed.  Prox Cx lesion is 10% stenosed.  Dist LM to Ost LAD lesion is 20% stenosed.  Ost 1st Diag lesion is 50% stenosed.  The left ventricular systolic function is normal.  LV end diastolic pressure is normal.  The left ventricular ejection fraction is 50-55% by visual estimate.  There is no mitral valve regurgitation.  Prox LAD lesion is 20% stenosed.  1. Mild non-obstructive CAD 2. Low normal LV systolic function  Recommendations: Medical management of CAD     Past Medical History:  Diagnosis Date  . Aortic insufficiency   . Arthritis   . Asthma   . Cervical disc disease   . Depression   . Diastolic dysfunction 07/19/5571  . Hyperlipidemia 08/24/2015  . Hypertension   . Lumbar disc disease   . Psoriasis    Past Surgical History:  Procedure Laterality Date  . CHOLECYSTECTOMY  2012  . HERNIA REPAIR    . LEFT  HEART CATH AND CORONARY ANGIOGRAPHY N/A 07/08/2018   Procedure: LEFT HEART CATH AND CORONARY ANGIOGRAPHY;  Surgeon: Burnell Blanks, MD;  Location: [facility];  Service: Cardiovascular;  Laterality: N/A;  . PAROTIDECTOMY Right 1992  . PROSTATE SURGERY  2002, 2004  . SINOSCOPY    . SINUS EXPLORATION  2013  . SPINE SURGERY  2013   L C7-T1 laminectomy and discectomy  . TONSILLECTOMY       Current Meds  Medication Sig  . albuterol (PROVENTIL  HFA;VENTOLIN HFA) 108 (90 Base) MCG/ACT inhaler Inhale 1 puff into the lungs as needed for wheezing or shortness of breath.  Marland Kitchen aspirin EC 81 MG tablet Take 81 mg by mouth daily.  . budesonide-formoterol (SYMBICORT) 160-4.5 MCG/ACT inhaler Inhale 2 puffs into the lungs 2 (two) times daily.  . cetirizine (ZYRTEC) 10 MG tablet Take 10 mg by mouth daily.   . Cholecalciferol (VITAMIN D3) 5000 units TABS Take 5,000 Units by mouth daily.   . Cranberry 1000 MG CAPS Take 1,000 mg by mouth daily.   . Glucosamine-Chondroit-Vit C-Mn (GLUCOSAMINE 1500 COMPLEX) CAPS Take 1 each by mouth daily.  Marland Kitchen lamoTRIgine (LAMICTAL) 200 MG tablet Take 400 mg by mouth daily.   . Magnesium Oxide (MAG-OXIDE) 200 MG TABS Take 2.5 tablets (500 mg total) by mouth daily.  . metoprolol succinate (TOPROL XL) 25 MG 24 hr tablet Take 12.5 mg by mouth 2 (two) times daily.  . Misc Natural Products (OSTEO BI-FLEX ADV JOINT SHIELD PO) Take 1 tablet by mouth daily.  . Multiple Vitamin (MULTIVITAMIN) capsule Take 1 capsule by mouth daily.  . Omega-3 Fatty Acids (FISH OIL) 1000 MG CAPS Take 2,400 mg by mouth daily.   . pantoprazole (PROTONIX) 40 MG tablet Take 1 tablet (40 mg total) by mouth daily.  . Probiotic Product (PROBIOTIC DAILY PO) Take 1 tablet by mouth daily.  Marland Kitchen zinc gluconate 50 MG tablet Take 1 tablet (50 mg total) by mouth daily.     Allergies:   Atorvastatin   Social History   Tobacco Use  . Smoking status: Never Smoker  . Smokeless tobacco: Never Used  Substance Use Topics  . Alcohol use: Yes    Alcohol/week: 0.0 standard drinks    Comment: occasional beer  . Drug use: No     Family Hx: The patient's family history includes Bipolar disorder in his brother; Cancer in an other family member; Colon cancer in his mother; Lung cancer in his father.  ROS:   Please see the history of present illness.    All other systems reviewed and are negative.   Labs/Other Tests and Data Reviewed:    Recent Labs: 12/23/2017:  ALT 35 07/09/2018: BUN 19; Creatinine, Ser 1.33; Hemoglobin 13.6; Platelets 99; Potassium 4.1; Sodium 140   Recent Lipid Panel Lab Results  Component Value Date/Time   CHOL 162 07/09/2018 04:07 AM   CHOL 131 12/23/2017 12:22 PM   TRIG 124 07/09/2018 04:07 AM   HDL 40 (L) 07/09/2018 04:07 AM   HDL 35 (L) 12/23/2017 12:22 PM   CHOLHDL 4.1 07/09/2018 04:07 AM   LDLCALC 97 07/09/2018 04:07 AM   LDLCALC 74 12/23/2017 12:22 PM    Wt Readings from Last 3 Encounters:  07/27/18 175 lb (79.4 kg)  07/08/18 173 lb (78.5 kg)  06/21/18 180 lb 6.4 oz (81.8 kg)     Exam:    Vital Signs:  BP 124/80   Pulse (!) 59   Ht 6' (1.829 m)  Wt 175 lb (79.4 kg)   BMI 23.73 kg/m    Well nourished, well developed male in noacute distress on visualization via web cam There is not dyspnea when he is talking, no edema. Assessment is limited by web cam capabilities.   ASSESSMENT & PLAN:    1.  Non-obstructive CAD: I have answered multiple questions and given lengthy and detailed explanations of his cath report vs his cardiac CTA. I have reviewed his prognosis, need to continue his medication regimen. He will not need any further ischemic testing unless he has new or worsening symptoms. I have given him and his wife reassurance. He verbalizes understanding.  He is NOT to travel for a minimum of 4 months until travel restrictions are lifted per CDC. He is aware that he is a high risk patient due to CAD. A letter is provided to him to give to his organization.   2. Hyperlipidemia: He has increased his rosuvastatin on his own to 10 mg from 5 mg due to LDL > 70 . I will refill his rosuvastatin Rx for this higher dose. He requests a "hard copy" be provided as well, to take to the [place].  I have instructed my nurse, Veronda Prude, LPN to provide this for him.He will need a recheck in 3 months with lipids and LFT's.   3. Mitral Valve Insufficiency: Repeat echo when available for non-essential testing when available.     COVID-19 Education: The signs and symptoms of COVID-19 were discussed with the patient and how to seek care for testing (follow up with PCP or arrange E-visit).The importance of social distancing was discussed today.  Patient Risk:   After full review of this patients clinical status, I feel that they are at least moderate risk at this time.  Time:   Today, I have spent 45  minutes with the patient with telehealth technology discussing his diagnosis and questions. Please see above.    Medication Adjustments/Labs and Tests Ordered: Current medicines are reviewed at length with the patient today.  Concerns regarding medicines are outlined above.   Tests Ordered: No orders of the defined types were placed in this encounter.  Medication Changes: No orders of the defined types were placed in this encounter.   Disposition:  3-4 months  Signed, Phill Myron. West Pugh, ANP, [facility] 07/27/2018 10:21 AM    [facility] [place], [place], [place]  [address] Phone: [phone]; Fax: [phone]

## 2018-07-26 NOTE — Telephone Encounter (Signed)
Pt notified to have bo and scale availble for video visit

## 2018-07-26 NOTE — Telephone Encounter (Signed)
Follow Up:; ° ° °Returning your call. °

## 2018-07-26 NOTE — Telephone Encounter (Signed)
-----   Message from Lendon Colonel, NP sent at 07/25/2018  5:59 PM EDT ----- Regarding: Make virtual This patient states he wants to be seen sooner.  I see that he is an office visit. Does he need to be seen in person? Happy to see him virtually. Just need clarification. May want to ask whoever the [place] who calls the patient to clarify.  Thanks Curt Bears

## 2018-07-26 NOTE — Telephone Encounter (Signed)
Pt would like to go over labs and cath results Pt email  [email or web address] meds verified

## 2018-07-27 ENCOUNTER — Other Ambulatory Visit: Payer: Self-pay

## 2018-07-27 ENCOUNTER — Telehealth (INDEPENDENT_AMBULATORY_CARE_PROVIDER_SITE_OTHER): Payer: Medicare Other | Admitting: Adult Health

## 2018-07-27 ENCOUNTER — Ambulatory Visit: Payer: Self-pay | Admitting: Adult Health

## 2018-07-27 VITALS — BP 124/80 | HR 59 | Ht 72.0 in | Wt 175.0 lb

## 2018-07-27 DIAGNOSIS — I2510 Atherosclerotic heart disease of native coronary artery without angina pectoris: Secondary | ICD-10-CM | POA: Diagnosis not present

## 2018-07-27 DIAGNOSIS — I340 Nonrheumatic mitral (valve) insufficiency: Secondary | ICD-10-CM | POA: Diagnosis not present

## 2018-07-27 DIAGNOSIS — E7800 Pure hypercholesterolemia, unspecified: Secondary | ICD-10-CM

## 2018-07-27 DIAGNOSIS — I10 Essential (primary) hypertension: Secondary | ICD-10-CM

## 2018-07-27 NOTE — Telephone Encounter (Signed)
Left message on voice mail -  Sent information to mychart account.  Asked patient to cal back if any issues

## 2018-07-27 NOTE — Telephone Encounter (Signed)
Follow up   Patient states that he did not receive an email for his televisit this morning. Please call the patient.

## 2018-07-28 ENCOUNTER — Ambulatory Visit: Payer: Self-pay | Admitting: Emergency Medicine

## 2018-07-29 ENCOUNTER — Ambulatory Visit: Payer: Medicare Other | Admitting: Adult Health

## 2018-08-03 ENCOUNTER — Telehealth: Payer: Self-pay | Admitting: Adult Health

## 2018-08-03 ENCOUNTER — Telehealth: Payer: Self-pay

## 2018-08-03 DIAGNOSIS — R5381 Other malaise: Secondary | ICD-10-CM | POA: Diagnosis not present

## 2018-08-03 NOTE — Telephone Encounter (Signed)
Please inform patient that he should assume he has a viral infection and give himself another week to see where he goes with [place] and he should try to self isolate himself and not expose anyone to this virus and if he needs to go out in public wear a mask.  Hopefully at the end of another week most of this will resolve.

## 2018-08-03 NOTE — Telephone Encounter (Signed)
Attempted to contact patient again, went to voicemail.  Left message to call back.

## 2018-08-03 NOTE — Telephone Encounter (Signed)
Follow Up:; ° ° °Returning your call. °

## 2018-08-03 NOTE — Telephone Encounter (Signed)
He can decrease the Crestor to 5 mg.

## 2018-08-03 NOTE — Telephone Encounter (Signed)
Patient called with complaints of mild cough, mild sore throat, some pain with swallowing, swollen lymph nodes in his groin and underarm area. He denies fever, chills, body aches, nausea, vomiting and diarrhea. His symptoms have been ongoing for 1.5 weeks. Patient has not had his allergy injection in over 8 weeks and he had a heart cath placed 07-08-2018. Please advise and thank you.

## 2018-08-03 NOTE — Telephone Encounter (Signed)
New Message   Patient returning Julie's phone call.

## 2018-08-03 NOTE — Telephone Encounter (Signed)
Called patient, did not answer, LVM advising to call back to dsicuss symptoms.

## 2018-08-03 NOTE — Telephone Encounter (Signed)
The lymph node swelling should not be related to the cath. He should be seen in person for assessment. Needs to see PCP for in person assessment not cardiology

## 2018-08-03 NOTE — Telephone Encounter (Signed)
Patient informed of the recommendations.

## 2018-08-03 NOTE — Telephone Encounter (Signed)
Pt informed of providers result & recommendations. Pt verbalized understanding. He will call PCP and make appt to be seen

## 2018-08-03 NOTE — Telephone Encounter (Signed)
Called patient, spoke with him, he states that he has had some swollen lymph nodes under his arm and in his groin area that are painful. He does not feel well, and feels very run down with no energy. Patient denies fever. He had a televisit with his PCP today and they suggest he contact his Cardiologist, but also suggested that we decrease his Rosuvastatin back down to 5 mg or atleast do the 10 mg every other day. He would like your opinion on this, and the swelling in lymph nodes if it could be related to his recent cath, or even where he has not been able to do his allergy shots due to the virus he has been 8-9 weeks without them.   I advised I would route a message, and call back with recommendations.

## 2018-08-03 NOTE — Telephone Encounter (Signed)
Pt had a Telehealth visit with Jory Sims on 03/31 as a f/u for a Heart cath. He is currently experiencing swollen lymph nodes in his underarm and groin, and has fatigue. He has a slight cough and post nasal drip, but no fever. He just feels run down overall.  He wants to clarify that these these symptoms are not a result of his heart cath

## 2018-08-11 ENCOUNTER — Ambulatory Visit: Payer: Self-pay | Admitting: Adult Health

## 2018-08-19 ENCOUNTER — Ambulatory Visit (INDEPENDENT_AMBULATORY_CARE_PROVIDER_SITE_OTHER): Payer: Medicare Other | Admitting: *Deleted

## 2018-08-19 DIAGNOSIS — J309 Allergic rhinitis, unspecified: Secondary | ICD-10-CM | POA: Diagnosis not present

## 2018-08-23 ENCOUNTER — Ambulatory Visit (INDEPENDENT_AMBULATORY_CARE_PROVIDER_SITE_OTHER): Payer: Medicare Other | Admitting: *Deleted

## 2018-08-23 DIAGNOSIS — J309 Allergic rhinitis, unspecified: Secondary | ICD-10-CM

## 2018-08-30 ENCOUNTER — Ambulatory Visit (INDEPENDENT_AMBULATORY_CARE_PROVIDER_SITE_OTHER): Payer: Medicare Other

## 2018-08-30 DIAGNOSIS — J309 Allergic rhinitis, unspecified: Secondary | ICD-10-CM

## 2018-09-06 DIAGNOSIS — M7918 Myalgia, other site: Secondary | ICD-10-CM | POA: Diagnosis not present

## 2018-09-06 DIAGNOSIS — M5136 Other intervertebral disc degeneration, lumbar region: Secondary | ICD-10-CM | POA: Diagnosis not present

## 2018-09-06 DIAGNOSIS — M79652 Pain in left thigh: Secondary | ICD-10-CM | POA: Diagnosis not present

## 2018-09-06 DIAGNOSIS — M47817 Spondylosis without myelopathy or radiculopathy, lumbosacral region: Secondary | ICD-10-CM | POA: Diagnosis not present

## 2018-09-07 ENCOUNTER — Ambulatory Visit (INDEPENDENT_AMBULATORY_CARE_PROVIDER_SITE_OTHER): Payer: Medicare Other

## 2018-09-07 DIAGNOSIS — J309 Allergic rhinitis, unspecified: Secondary | ICD-10-CM

## 2018-09-15 ENCOUNTER — Ambulatory Visit (INDEPENDENT_AMBULATORY_CARE_PROVIDER_SITE_OTHER): Payer: Medicare Other | Admitting: *Deleted

## 2018-09-15 DIAGNOSIS — J309 Allergic rhinitis, unspecified: Secondary | ICD-10-CM | POA: Diagnosis not present

## 2018-09-23 ENCOUNTER — Ambulatory Visit (INDEPENDENT_AMBULATORY_CARE_PROVIDER_SITE_OTHER): Payer: Medicare Other

## 2018-09-23 DIAGNOSIS — J309 Allergic rhinitis, unspecified: Secondary | ICD-10-CM | POA: Diagnosis not present

## 2018-09-27 DIAGNOSIS — M7918 Myalgia, other site: Secondary | ICD-10-CM | POA: Diagnosis not present

## 2018-09-27 DIAGNOSIS — M545 Low back pain: Secondary | ICD-10-CM | POA: Diagnosis not present

## 2018-09-27 DIAGNOSIS — M47817 Spondylosis without myelopathy or radiculopathy, lumbosacral region: Secondary | ICD-10-CM | POA: Diagnosis not present

## 2018-09-27 DIAGNOSIS — M5136 Other intervertebral disc degeneration, lumbar region: Secondary | ICD-10-CM | POA: Diagnosis not present

## 2018-09-28 ENCOUNTER — Telehealth: Payer: Self-pay | Admitting: Cardiovascular Disease

## 2018-09-28 NOTE — Telephone Encounter (Signed)
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 He had a follow up with Jory Sims DNP after this. He wants to f/up with Dr. Oval Linsey ONLY. Explained that Dr. Oval Linsey is out on leave until end of August. His wife has an appt with her on 9/9 and I notified him that she has not openings this day. Advised will route to her nurse for follow up and schedulers to add to cancellation list.

## 2018-09-28 NOTE — Telephone Encounter (Signed)
° ° °  Patient calling to request appointment with Dr Oval Linsey , following hospital stay for heart attack. Patient declined appt with APP staff Patient requesting call from nurse to discuss care.

## 2018-09-29 ENCOUNTER — Ambulatory Visit (INDEPENDENT_AMBULATORY_CARE_PROVIDER_SITE_OTHER): Payer: Medicare Other

## 2018-09-29 DIAGNOSIS — J309 Allergic rhinitis, unspecified: Secondary | ICD-10-CM | POA: Diagnosis not present

## 2018-10-05 DIAGNOSIS — E7800 Pure hypercholesterolemia, unspecified: Secondary | ICD-10-CM | POA: Diagnosis not present

## 2018-10-05 DIAGNOSIS — D696 Thrombocytopenia, unspecified: Secondary | ICD-10-CM | POA: Diagnosis not present

## 2018-10-05 DIAGNOSIS — M13819 Other specified arthritis, unspecified shoulder: Secondary | ICD-10-CM | POA: Diagnosis not present

## 2018-10-05 DIAGNOSIS — Q7649 Other congenital malformations of spine, not associated with scoliosis: Secondary | ICD-10-CM | POA: Diagnosis not present

## 2018-10-05 DIAGNOSIS — N400 Enlarged prostate without lower urinary tract symptoms: Secondary | ICD-10-CM | POA: Diagnosis not present

## 2018-10-05 DIAGNOSIS — E559 Vitamin D deficiency, unspecified: Secondary | ICD-10-CM | POA: Diagnosis not present

## 2018-10-05 DIAGNOSIS — E291 Testicular hypofunction: Secondary | ICD-10-CM | POA: Diagnosis not present

## 2018-10-05 DIAGNOSIS — M545 Low back pain: Secondary | ICD-10-CM | POA: Diagnosis not present

## 2018-10-05 DIAGNOSIS — R7303 Prediabetes: Secondary | ICD-10-CM | POA: Diagnosis not present

## 2018-10-05 DIAGNOSIS — I10 Essential (primary) hypertension: Secondary | ICD-10-CM | POA: Diagnosis not present

## 2018-10-08 DIAGNOSIS — I2510 Atherosclerotic heart disease of native coronary artery without angina pectoris: Secondary | ICD-10-CM | POA: Diagnosis not present

## 2018-10-08 DIAGNOSIS — E878 Other disorders of electrolyte and fluid balance, not elsewhere classified: Secondary | ICD-10-CM | POA: Diagnosis not present

## 2018-10-08 DIAGNOSIS — R7303 Prediabetes: Secondary | ICD-10-CM | POA: Diagnosis not present

## 2018-10-08 DIAGNOSIS — E7800 Pure hypercholesterolemia, unspecified: Secondary | ICD-10-CM | POA: Diagnosis not present

## 2018-10-08 DIAGNOSIS — I10 Essential (primary) hypertension: Secondary | ICD-10-CM | POA: Diagnosis not present

## 2018-10-08 DIAGNOSIS — D696 Thrombocytopenia, unspecified: Secondary | ICD-10-CM | POA: Diagnosis not present

## 2018-10-12 DIAGNOSIS — L821 Other seborrheic keratosis: Secondary | ICD-10-CM | POA: Diagnosis not present

## 2018-10-12 DIAGNOSIS — D1801 Hemangioma of skin and subcutaneous tissue: Secondary | ICD-10-CM | POA: Diagnosis not present

## 2018-10-12 DIAGNOSIS — D225 Melanocytic nevi of trunk: Secondary | ICD-10-CM | POA: Diagnosis not present

## 2018-10-14 ENCOUNTER — Other Ambulatory Visit: Payer: Self-pay | Admitting: Gastroenterology

## 2018-10-14 DIAGNOSIS — K7469 Other cirrhosis of liver: Secondary | ICD-10-CM

## 2018-10-15 ENCOUNTER — Ambulatory Visit
Admission: RE | Admit: 2018-10-15 | Discharge: 2018-10-15 | Disposition: A | Discharge status: Home / Self Care | Payer: Medicare Other | Source: Ambulatory Visit | Attending: Gastroenterology | Admitting: Gastroenterology

## 2018-10-15 DIAGNOSIS — K7460 Unspecified cirrhosis of liver: Secondary | ICD-10-CM | POA: Diagnosis not present

## 2018-10-15 DIAGNOSIS — K7469 Other cirrhosis of liver: Secondary | ICD-10-CM

## 2018-10-15 DIAGNOSIS — Z9049 Acquired absence of other specified parts of digestive tract: Secondary | ICD-10-CM | POA: Diagnosis not present

## 2018-10-20 DIAGNOSIS — M9904 Segmental and somatic dysfunction of sacral region: Secondary | ICD-10-CM | POA: Diagnosis not present

## 2018-10-21 DIAGNOSIS — E878 Other disorders of electrolyte and fluid balance, not elsewhere classified: Secondary | ICD-10-CM | POA: Diagnosis not present

## 2018-10-22 DIAGNOSIS — M542 Cervicalgia: Secondary | ICD-10-CM | POA: Diagnosis not present

## 2018-11-01 ENCOUNTER — Ambulatory Visit (INDEPENDENT_AMBULATORY_CARE_PROVIDER_SITE_OTHER): Payer: Medicare Other

## 2018-11-01 DIAGNOSIS — J309 Allergic rhinitis, unspecified: Secondary | ICD-10-CM | POA: Diagnosis not present

## 2018-11-02 ENCOUNTER — Ambulatory Visit: Payer: Medicare Other | Admitting: Allergy and Immunology

## 2018-11-02 DIAGNOSIS — M542 Cervicalgia: Secondary | ICD-10-CM | POA: Diagnosis not present

## 2018-11-07 ENCOUNTER — Emergency Department (HOSPITAL_COMMUNITY)
Admission: EM | Admit: 2018-11-07 | Discharge: 2018-11-08 | Disposition: A | Discharge status: Home / Self Care | Payer: Medicare Other | Attending: Emergency Medicine | Admitting: Emergency Medicine

## 2018-11-07 ENCOUNTER — Encounter (HOSPITAL_COMMUNITY): Payer: Self-pay | Admitting: *Deleted

## 2018-11-07 ENCOUNTER — Other Ambulatory Visit: Payer: Self-pay

## 2018-11-07 DIAGNOSIS — Z79899 Other long term (current) drug therapy: Secondary | ICD-10-CM | POA: Diagnosis not present

## 2018-11-07 DIAGNOSIS — Y999 Unspecified external cause status: Secondary | ICD-10-CM | POA: Insufficient documentation

## 2018-11-07 DIAGNOSIS — Y93E9 Activity, other interior property and clothing maintenance: Secondary | ICD-10-CM | POA: Diagnosis not present

## 2018-11-07 DIAGNOSIS — W298XXA Contact with other powered powered hand tools and household machinery, initial encounter: Secondary | ICD-10-CM | POA: Diagnosis not present

## 2018-11-07 DIAGNOSIS — Y92019 Unspecified place in single-family (private) house as the place of occurrence of the external cause: Secondary | ICD-10-CM | POA: Diagnosis not present

## 2018-11-07 DIAGNOSIS — Z7982 Long term (current) use of aspirin: Secondary | ICD-10-CM | POA: Insufficient documentation

## 2018-11-07 DIAGNOSIS — S61231A Puncture wound without foreign body of left index finger without damage to nail, initial encounter: Secondary | ICD-10-CM | POA: Insufficient documentation

## 2018-11-07 DIAGNOSIS — I2510 Atherosclerotic heart disease of native coronary artery without angina pectoris: Secondary | ICD-10-CM | POA: Insufficient documentation

## 2018-11-07 DIAGNOSIS — I129 Hypertensive chronic kidney disease with stage 1 through stage 4 chronic kidney disease, or unspecified chronic kidney disease: Secondary | ICD-10-CM | POA: Diagnosis not present

## 2018-11-07 DIAGNOSIS — N182 Chronic kidney disease, stage 2 (mild): Secondary | ICD-10-CM | POA: Insufficient documentation

## 2018-11-07 DIAGNOSIS — S6992XA Unspecified injury of left wrist, hand and finger(s), initial encounter: Secondary | ICD-10-CM | POA: Diagnosis not present

## 2018-11-07 DIAGNOSIS — S61241A Puncture wound with foreign body of left index finger without damage to nail, initial encounter: Secondary | ICD-10-CM | POA: Diagnosis not present

## 2018-11-07 MED ORDER — OXYCODONE-ACETAMINOPHEN 5-325 MG PO TABS
1.0000 | ORAL_TABLET | ORAL | Status: AC | PRN
  Administered 2018-11-07 – 2018-11-08 (×2): 1 via ORAL
  Filled 2018-11-07 (×2): qty 1

## 2018-11-07 NOTE — ED Triage Notes (Signed)
Pt reports injuring left index finger with drill bit and now having severe pain. No bleeding noted at triage. Tetanus current.

## 2018-11-08 ENCOUNTER — Emergency Department (HOSPITAL_COMMUNITY): Payer: Medicare Other

## 2018-11-08 DIAGNOSIS — S61231A Puncture wound without foreign body of left index finger without damage to nail, initial encounter: Secondary | ICD-10-CM | POA: Diagnosis not present

## 2018-11-08 DIAGNOSIS — S6992XA Unspecified injury of left wrist, hand and finger(s), initial encounter: Secondary | ICD-10-CM | POA: Diagnosis not present

## 2018-11-08 DIAGNOSIS — S61432A Puncture wound without foreign body of left hand, initial encounter: Secondary | ICD-10-CM | POA: Diagnosis not present

## 2018-11-08 MED ORDER — IBUPROFEN 200 MG PO TABS: 800.0000 mg | ORAL_TABLET | Freq: Three times a day (TID) | ORAL | Status: DC | PRN

## 2018-11-08 MED ORDER — SODIUM CHLORIDE 0.9 % IV SOLN
2.0000 g | Freq: Once | INTRAVENOUS | Status: AC
  Administered 2018-11-08: 2 g via INTRAVENOUS
  Filled 2018-11-08: qty 20

## 2018-11-08 MED ORDER — BUPIVACAINE HCL (PF) 0.5 % IJ SOLN
5.0000 mL | Freq: Once | INTRAMUSCULAR | Status: AC
  Administered 2018-11-08: 5 mL
  Filled 2018-11-08: qty 10

## 2018-11-08 MED ORDER — ACETAMINOPHEN 500 MG PO TABS: 1000.0000 mg | ORAL_TABLET | Freq: Three times a day (TID) | ORAL | 0 refills | Status: AC

## 2018-11-08 MED ORDER — CEPHALEXIN 500 MG PO CAPS: 500.0000 mg | ORAL_CAPSULE | Freq: Two times a day (BID) | ORAL | 0 refills | Status: AC

## 2018-11-08 MED ORDER — ACETAMINOPHEN 500 MG PO TABS
1000.0000 mg | ORAL_TABLET | Freq: Once | ORAL | Status: AC
  Administered 2018-11-08: 1000 mg via ORAL
  Filled 2018-11-08: qty 2

## 2018-11-08 MED ORDER — KETOROLAC TROMETHAMINE 60 MG/2ML IM SOLN: 30.0000 mg | Freq: Once | INTRAMUSCULAR | Status: DC

## 2018-11-08 MED ORDER — KETOROLAC TROMETHAMINE 15 MG/ML IJ SOLN
15.0000 mg | Freq: Once | INTRAMUSCULAR | Status: AC
  Administered 2018-11-08: 15 mg via INTRAVENOUS
  Filled 2018-11-08: qty 1

## 2018-11-08 MED ORDER — OXYCODONE-ACETAMINOPHEN 5-325 MG PO TABS: 1.0000 | ORAL_TABLET | Freq: Three times a day (TID) | ORAL | 0 refills | Status: AC | PRN

## 2018-11-08 NOTE — ED Provider Notes (Signed)
[facility] EMERGENCY DEPARTMENT Provider Note  CSN: [id number] Arrival date & time: 11/07/18 2341  Chief Complaint(s) Finger Injury  HPI Arthur Moore is a 68 y.o. male who presents to the emergency department with left index injury that occurred 8 hours prior to arrival.  Patient reports that he was fixing furniture piece at his home and slipped with a drill catching himself at the radial aspect near the MTP.  Patient reports that he irrigated it and put peroxide.  States that he has had increase in gradually worsening pain since then that is exacerbated with range of motion of the finger and palpation.  There is no swelling or redness.  No additional trauma.  Reports that his tetanus is up-to-date.  Denies any other physical complaints.  HPI  Past Medical History Past Medical History:  Diagnosis Date   Aortic insufficiency    Arthritis    Asthma    Cervical disc disease    Depression    Diastolic dysfunction 08/14/6220   Hyperlipidemia 08/24/2015   Hypertension    Lumbar disc disease    Psoriasis    Patient Active Problem List   Diagnosis Date Noted   CAD (coronary artery disease) 07/09/2018   CKD (chronic kidney disease), 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   Chest pain of uncertain etiology 97/98/9211   Chronic cough 06/21/2018   Thrombocytopenia ([place]) 12/01/2016   Transaminitis 94/17/4081   Diastolic dysfunction 44/81/8563   MDD (major depressive disorder), recurrent severe, without psychosis ([place]) 10/13/2015   Hyperlipidemia 08/24/2015   Arthritis    Depression    Hypertension    Aortic insufficiency    Psoriasis    Cervical disc disease    Lumbar disc disease    Asthma 01/06/2015   Psoriatic arthritis ([place]) 01/06/2015   Allergic rhinoconjunctivitis 01/06/2015   GERD (gastroesophageal reflux disease) 01/06/2015   Laryngopharyngeal reflux (LPR) 01/06/2015   Home Medication(s) Prior to Admission medications   Medication Sig  Start Date End Date Taking? Authorizing Provider  albuterol (PROVENTIL HFA;VENTOLIN HFA) 108 (90 Base) MCG/ACT inhaler Inhale 1 puff into the lungs as needed for wheezing or shortness of breath.   Yes [provider]  aspirin EC 81 MG tablet Take 81 mg by mouth daily.   Yes [provider]  budesonide-formoterol (SYMBICORT) 160-4.5 MCG/ACT inhaler Inhale 2 puffs into the lungs 2 (two) times daily.   Yes [provider]  cetirizine (ZYRTEC) 10 MG tablet Take 10 mg by mouth daily.    Yes [provider]  Cholecalciferol (VITAMIN D3) 5000 units TABS Take 5,000 Units by mouth daily.    Yes [provider]  Cranberry 1000 MG CAPS Take 1,000 mg by mouth daily.    Yes [provider]  Glucosamine-Chondroit-Vit C-Mn (GLUCOSAMINE 1500 COMPLEX) CAPS Take 1 each by mouth daily. 07/26/18  Yes Lendon Colonel, NP  lamoTRIgine (LAMICTAL) 200 MG tablet Take 400 mg by mouth daily.    Yes [provider]  Magnesium Oxide (MAG-OXIDE) 200 MG TABS Take 2.5 tablets (500 mg total) by mouth daily. 07/26/18  Yes Lendon Colonel, NP  metoprolol succinate (TOPROL XL) 25 MG 24 hr tablet Take 12.5 mg by mouth 2 (two) times daily.   Yes [provider]  Misc Natural Products (OSTEO BI-FLEX ADV JOINT SHIELD PO) Take 1 tablet by mouth daily.   Yes [provider]  Multiple Vitamin (MULTIVITAMIN) capsule Take 1 capsule by mouth daily.   Yes [provider]  Omega-3 Fatty Acids ([place]  OIL) 1000 MG CAPS Take 2,400 mg by mouth daily.    Yes [provider]  pantoprazole (PROTONIX) 40 MG tablet Take 1 tablet (40 mg total) by mouth daily. 06/09/17  Yes Kozlow, Donnamarie Poag, MD  Probiotic Product (PROBIOTIC DAILY PO) Take 1 tablet by mouth daily.   Yes [provider]  rosuvastatin (CRESTOR) 5 MG tablet Take 1 tablet (5 mg total) by mouth daily. 10/07/17 11/08/27 Yes Skeet Latch, MD  zinc gluconate 50 MG tablet Take 1 tablet (50  mg total) by mouth daily. 07/26/18  Yes Lendon Colonel, NP  acetaminophen (TYLENOL) 500 MG tablet Take 2 tablets (1,000 mg total) by mouth every 8 (eight) hours for 5 days. Do not take more than 4000 mg of acetaminophen (Tylenol) in a 24-hour period. Please note that other medicines that you may be prescribed may have Tylenol as well. 11/08/18 11/13/18  Fatima Blank, MD  cephALEXin (KEFLEX) 500 MG capsule Take 1 capsule (500 mg total) by mouth 2 (two) times daily for 5 days. 11/08/18 11/13/18  Fatima Blank, MD  ibuprofen (ADVIL) 200 MG tablet Take 4 tablets (800 mg total) by mouth every 8 (eight) hours as needed. 11/08/18   Esgar Barnick, Grayce Sessions, MD  oxyCODONE-acetaminophen (PERCOCET) 5-325 MG tablet Take 1 tablet by mouth every 8 (eight) hours as needed for up to 5 days for severe pain. Please do not exceed 4000 mg of acetaminophen (Tylenol) a 24-hour period. Please note that he may be prescribed additional medicine that contains acetaminophen. 11/08/18 11/13/18  Fatima Blank, MD                                                                                                                                    Past Surgical History Past Surgical History:  Procedure Laterality Date   CHOLECYSTECTOMY  2012   HERNIA REPAIR     LEFT HEART CATH AND CORONARY ANGIOGRAPHY N/A 07/08/2018   Procedure: LEFT HEART CATH AND CORONARY ANGIOGRAPHY;  Surgeon: Burnell Blanks, MD;  Location: [facility];  Service: Cardiovascular;  Laterality: N/A;   PAROTIDECTOMY Right 1992   PROSTATE SURGERY  2002, 2004   [place]     SINUS EXPLORATION  2013   SPINE SURGERY  2013   L C7-T1 laminectomy and discectomy   TONSILLECTOMY     Family History Family History  Problem Relation Age of Onset   Colon cancer Mother    Lung cancer Father    Cancer Other    Bipolar disorder Brother     Social History Social History   Tobacco Use   Smoking status: Never Smoker    Smokeless tobacco: Never Used  Substance Use Topics   Alcohol use: Yes    Alcohol/week: 0.0 standard drinks    Comment: occasional beer   Drug use: No   Allergies Atorvastatin  Review of Systems Review of Systems All other systems are  reviewed and are negative for acute change except as noted in the HPI  Physical Exam Vital Signs  I have reviewed the triage vital signs BP (!) 148/92 (BP Location: Right Arm)    Pulse 75    Temp 98.3 F (36.8 C) (Oral)    Resp (!) 22    SpO2 100%   Physical Exam Vitals signs reviewed.  Constitutional:      General: He is not in acute distress.    Appearance: He is well-developed. He is not diaphoretic.  HENT:     Head: Normocephalic and atraumatic.     Jaw: No trismus.     Right Ear: External ear normal.     Left Ear: External ear normal.     Nose: Nose normal.  Eyes:     General: No scleral icterus.    Conjunctiva/sclera: Conjunctivae normal.  Neck:     Musculoskeletal: Normal range of motion.     Trachea: Phonation normal.  Cardiovascular:     Rate and Rhythm: Normal rate and regular rhythm.  Pulmonary:     Effort: Pulmonary effort is normal. No respiratory distress.     Breath sounds: No stridor.  Abdominal:     General: There is no distension.  Musculoskeletal: Normal range of motion.       Hands:  Neurological:     Mental Status: He is alert and oriented to person, place, and time.  Psychiatric:        Behavior: Behavior normal.     ED Results and Treatments Labs (all labs ordered are listed, but only abnormal results are displayed) Labs Reviewed - No data to display                                                                                                                       EKG  EKG Interpretation  Date/Time:    Ventricular Rate:    PR Interval:    QRS Duration:   QT Interval:    QTC Calculation:   R Axis:     Text Interpretation:        Radiology Dg Finger Index Left  Result Date:  11/08/2018 CLINICAL DATA:  68 y/o M; injury with drill near metacarpophalangeal joint. EXAM: LEFT INDEX FINGER 2+V COMPARISON:  None. FINDINGS: There is no evidence of fracture or dislocation. Osteoarthrosis of the distal interphalangeal joint with periarticular osteophytosis. No radiopaque foreign body identified. IMPRESSION: No acute fracture or radiopaque foreign body identified. Electronically Signed   By: Kristine Garbe M.D.   On: 11/08/2018 00:29    Pertinent labs & imaging results that were available during my care of the patient were reviewed by me and considered in my medical decision making (see chart for details).  Medications Ordered in ED Medications  oxyCODONE-acetaminophen (PERCOCET/ROXICET) 5-325 MG per tablet 1 tablet (1 tablet Oral Given 11/08/18 0116)  cefTRIAXone (ROCEPHIN) 2 g in sodium chloride 0.9 % 100 mL IVPB ( Intravenous Stopped 11/08/18 0507)  ketorolac (TORADOL) 15 MG/ML  injection 15 mg (15 mg Intravenous Given 11/08/18 0435)  bupivacaine (MARCAINE) 0.5 % injection 5 mL (5 mLs Infiltration Given 11/08/18 0459)  acetaminophen (TYLENOL) tablet 1,000 mg (1,000 mg Oral Given 11/08/18 8675)                                                                                                                                    Procedures Ultrasound ED Soft Tissue  Date/Time: 11/08/2018 6:35 AM Performed by: Fatima Blank, MD Authorized by: Fatima Blank, MD   Procedure details:    Indications: limb pain     Longitudinal view:  Visualized   Images: archived   Location:    Location: upper extremity     Side:  Left Findings:     no abscess present    no cellulitis present .Nerve Block  Date/Time: 11/08/2018 6:36 AM Performed by: Fatima Blank, MD Authorized by: Fatima Blank, MD   Consent:    Consent obtained:  Verbal   Consent given by:  Patient   Risks discussed:  Intravenous injection, pain, nerve damage, unsuccessful block  and allergic reaction   Alternatives discussed:  Alternative treatment Indications:    Indications:  Pain relief Location:    Body area:  Upper extremity   Upper extremity nerve:  Radial   Laterality:  Left Pre-procedure details:    Skin preparation:  2% chlorhexidine   Preparation: Patient was prepped and draped in usual sterile fashion   Procedure details (see MAR for exact dosages):    Block needle gauge:  27 G   Anesthetic injected:  Bupivacaine 0.5% w/o epi   Injection procedure:  Anatomic landmarks identified, incremental injection, negative aspiration for blood, anatomic landmarks palpated and introduced needle   Paresthesia:  None Post-procedure details:    Outcome:  Pain improved   Patient tolerance of procedure:  Tolerated well, no immediate complications    (including critical care time)  Medical Decision Making / ED Course I have reviewed the nursing notes for this encounter and the patient's prior records (if available in EHR or on provided paperwork).   Aydrian Halpin was evaluated in Emergency Department on 11/08/2018 for the symptoms described in the history of present illness. He was evaluated in the context of the global COVID-19 pandemic, which necessitated consideration that the patient might be at risk for infection with the SARS-CoV-2 virus that causes COVID-19. Institutional protocols and algorithms that pertain to the evaluation of patients at risk for COVID-19 are in a state of rapid change based on information released by regulatory bodies including the CDC and federal and state organizations. These policies and algorithms were followed during the patient's care in the ED.  Puncture wound to the left hand near the second MTP.  Bedside ultrasound without evidence of cellulitis, abscess, tenosynovitis or joint effusion.  Plain film without foreign body.  Given type of injury, patient covered with prophylactic antibiotics.  Radial nerve block was performed for  pain relief.  Patient is already established with Dr. Apolonio Schneiders for hand surgery.  Recommended close follow-up for further evaluation.  The patient appears reasonably screened and/or stabilized for discharge and I doubt any other medical condition or other [facility] requiring further screening, evaluation, or treatment in the ED at this time prior to discharge.  The patient is safe for discharge with strict return precautions.       Final Clinical Impression(s) / ED Diagnoses Final diagnoses:  Puncture wound of left index finger     The patient appears reasonably screened and/or stabilized for discharge and I doubt any other medical condition or other [facility] requiring further screening, evaluation, or treatment in the ED at this time prior to discharge.  Disposition: Discharge  Condition: Good  I have discussed the results, Dx and Tx plan with the patient who expressed understanding and agree(s) with the plan. Discharge instructions discussed at great length. The patient was given strict return precautions who verbalized understanding of the instructions. No further questions at time of discharge.    ED Discharge Orders         Ordered    acetaminophen (TYLENOL) 500 MG tablet  Every 8 hours     11/08/18 0615    ibuprofen (ADVIL) 200 MG tablet  Every 8 hours PRN     11/08/18 0615    oxyCODONE-acetaminophen (PERCOCET) 5-325 MG tablet  Every 8 hours PRN     11/08/18 0615    cephALEXin (KEFLEX) 500 MG capsule  2 times daily     11/08/18 0618          Follow Up: Iran Planas, MD [address] [place] [address] [place] [address] [phone]  Schedule an appointment as soon as possible for a visit  in 3-5 days, For wound re-check     This chart was dictated using voice recognition software.  Despite best efforts to proofread,  errors can occur which can change the documentation meaning.   Fatima Blank, MD 11/08/18 [phone]

## 2018-11-09 DIAGNOSIS — M5030 Other cervical disc degeneration, unspecified cervical region: Secondary | ICD-10-CM | POA: Diagnosis not present

## 2018-11-09 DIAGNOSIS — M9904 Segmental and somatic dysfunction of sacral region: Secondary | ICD-10-CM | POA: Diagnosis not present

## 2018-11-09 DIAGNOSIS — G8929 Other chronic pain: Secondary | ICD-10-CM | POA: Diagnosis not present

## 2018-11-10 ENCOUNTER — Telehealth: Payer: Self-pay | Admitting: Oncology

## 2018-11-10 NOTE — Telephone Encounter (Signed)
Received a new patient referral from Dr. Drema Dallas for decreased platelets. Pt cld and scheduled an appt to see Dr. Alen Blew on 7/23 at [facility] to arrive 20 minutes early. Also aware of our no visitor policy.

## 2018-11-11 DIAGNOSIS — Z86010 Personal history of colonic polyps: Secondary | ICD-10-CM | POA: Diagnosis not present

## 2018-11-11 DIAGNOSIS — K7460 Unspecified cirrhosis of liver: Secondary | ICD-10-CM | POA: Diagnosis not present

## 2018-11-11 DIAGNOSIS — K862 Cyst of pancreas: Secondary | ICD-10-CM | POA: Diagnosis not present

## 2018-11-15 DIAGNOSIS — S61432D Puncture wound without foreign body of left hand, subsequent encounter: Secondary | ICD-10-CM | POA: Diagnosis not present

## 2018-11-18 ENCOUNTER — Telehealth: Payer: Self-pay | Admitting: Oncology

## 2018-11-18 ENCOUNTER — Other Ambulatory Visit: Payer: Self-pay

## 2018-11-18 ENCOUNTER — Encounter: Payer: Self-pay | Admitting: Oncology

## 2018-11-18 ENCOUNTER — Inpatient Hospital Stay: Payer: Medicare Other | Attending: Oncology | Admitting: Oncology

## 2018-11-18 VITALS — BP 127/74 | HR 60 | Temp 98.9°F | Resp 17 | Ht 72.0 in | Wt 176.8 lb

## 2018-11-18 DIAGNOSIS — D696 Thrombocytopenia, unspecified: Secondary | ICD-10-CM | POA: Diagnosis not present

## 2018-11-18 NOTE — Progress Notes (Signed)
Reason for the request:    Thrombocytopenia  HPI: I was asked by Dr. Drema Dallas to evaluate Arthur Moore for new finding of thrombocytopenia.  He is a 68 year old man with history of hypertension, hyperlipidemia and arthritis who was noted to have mild thrombocytopenia dating back to January 2019.  His CBC at that time showed a white cell count of 5.6, hemoglobin of 14.4 and a platelet count of 94.  In March 2020 his platelet count was 101 with a repeat platelet count on July 09, 2018 was 99.  A recent test CBC obtained in June 2020 showed a platelet count of 116 with normal white cell count, hemoglobin and differential.  Clinically, he reports no symptoms of bleeding but does report periodic bruising on aspirin.  He has been on Protonix and Lamictal for an extended period of time.  His Protonix was not refilled because of his thrombocytopenia from his GI doctor.  He remains active and continues to attend activities of daily living.  He does report chronic cervical disc disease and pain related to it.  He does not report any headaches, blurry vision, syncope or seizures. Does not report any fevers, chills or sweats.  Does not report any cough, wheezing or hemoptysis.  Does not report any chest pain, palpitation, orthopnea or leg edema.  Does not report any nausea, vomiting or abdominal pain.  Does not report any constipation or diarrhea.  Does not report any skeletal complaints.    Does not report frequency, urgency or hematuria.  Does not report any skin rashes or lesions. Does not report any heat or cold intolerance.  Does not report any lymphadenopathy or petechiae.  Does not report any anxiety or depression.  Remaining review of systems is negative.    Past Medical History:  Diagnosis Date  . Aortic insufficiency   . Arthritis   . Asthma   . Cervical disc disease   . Depression   . Diastolic dysfunction 0/63/0160  . Hyperlipidemia 08/24/2015  . Hypertension   . Lumbar disc disease   . Psoriasis    :  Past Surgical History:  Procedure Laterality Date  . CHOLECYSTECTOMY  2012  . HERNIA REPAIR    . LEFT HEART CATH AND CORONARY ANGIOGRAPHY N/A 07/08/2018   Procedure: LEFT HEART CATH AND CORONARY ANGIOGRAPHY;  Surgeon: Burnell Blanks, MD;  Location: [facility];  Service: Cardiovascular;  Laterality: N/A;  . PAROTIDECTOMY Right 1992  . PROSTATE SURGERY  2002, 2004  . SINOSCOPY    . SINUS EXPLORATION  2013  . SPINE SURGERY  2013   L C7-T1 laminectomy and discectomy  . TONSILLECTOMY    :   Current Outpatient Medications:  .  albuterol (PROVENTIL HFA;VENTOLIN HFA) 108 (90 Base) MCG/ACT inhaler, Inhale 1 puff into the lungs as needed for wheezing or shortness of breath., Disp: , Rfl:  .  aspirin EC 81 MG tablet, Take 81 mg by mouth daily., Disp: , Rfl:  .  budesonide-formoterol (SYMBICORT) 160-4.5 MCG/ACT inhaler, Inhale 2 puffs into the lungs 2 (two) times daily., Disp: , Rfl:  .  cetirizine (ZYRTEC) 10 MG tablet, Take 10 mg by mouth daily. , Disp: , Rfl:  .  Cholecalciferol (VITAMIN D3) 5000 units TABS, Take 5,000 Units by mouth daily. , Disp: , Rfl:  .  Cranberry 1000 MG CAPS, Take 1,000 mg by mouth daily. , Disp: , Rfl:  .  Glucosamine-Chondroit-Vit C-Mn (GLUCOSAMINE 1500 COMPLEX) CAPS, Take 1 each by mouth daily., Disp: , Rfl:  0 .  ibuprofen (ADVIL) 200 MG tablet, Take 4 tablets (800 mg total) by mouth every 8 (eight) hours as needed., Disp: , Rfl:  .  lamoTRIgine (LAMICTAL) 200 MG tablet, Take 400 mg by mouth daily. , Disp: , Rfl:  .  Magnesium Oxide (MAG-OXIDE) 200 MG TABS, Take 2.5 tablets (500 mg total) by mouth daily., Disp: , Rfl: 0 .  metoprolol succinate (TOPROL XL) 25 MG 24 hr tablet, Take 12.5 mg by mouth 2 (two) times daily., Disp: , Rfl:  .  Misc Natural Products (OSTEO BI-FLEX ADV JOINT SHIELD PO), Take 1 tablet by mouth daily., Disp: , Rfl:  .  Multiple Vitamin (MULTIVITAMIN) capsule, Take 1 capsule by mouth daily., Disp: , Rfl:  .  Omega-3 Fatty  Acids (FISH OIL) 1000 MG CAPS, Take 2,400 mg by mouth daily. , Disp: , Rfl:  .  pantoprazole (PROTONIX) 40 MG tablet, Take 1 tablet (40 mg total) by mouth daily., Disp: 90 tablet, Rfl: 1 .  Probiotic Product (PROBIOTIC DAILY PO), Take 1 tablet by mouth daily., Disp: , Rfl:  .  rosuvastatin (CRESTOR) 5 MG tablet, Take 1 tablet (5 mg total) by mouth daily., Disp: 90 tablet, Rfl: 3 .  zinc gluconate 50 MG tablet, Take 1 tablet (50 mg total) by mouth daily., Disp: , Rfl: :  Allergies  Allergen Reactions  . Atorvastatin Other (See Comments)    Calf pain.  :  Family History  Problem Relation Age of Onset  . Colon cancer Mother   . Lung cancer Father   . Cancer Other   . Bipolar disorder Brother   :  Social History   Socioeconomic History  . Marital status: Married    Spouse name: Not on file  . Number of children: Not on file  . Years of education: Not on file  . Highest education level: Not on file  Occupational History  . Not on file  Social Needs  . Financial resource strain: Not on file  . Food insecurity    Worry: Not on file    Inability: Not on file  . Transportation needs    Medical: Not on file    Non-medical: Not on file  Tobacco Use  . Smoking status: Never Smoker  . Smokeless tobacco: Never Used  Substance and Sexual Activity  . Alcohol use: Yes    Alcohol/week: 0.0 standard drinks    Comment: occasional beer  . Drug use: No  . Sexual activity: Not on file  Lifestyle  . Physical activity    Days per week: Not on file    Minutes per session: Not on file  . Stress: Not on file  Relationships  . Social Herbalist on phone: Not on file    Gets together: Not on file    Attends religious service: Not on file    Active member of club or organization: Not on file    Attends meetings of clubs or organizations: Not on file    Relationship status: Not on file  . Intimate partner violence    Fear of current or ex partner: Not on file    Emotionally  abused: Not on file    Physically abused: Not on file    Forced sexual activity: Not on file  Other Topics Concern  . Not on file  Social History Narrative  . Not on file  :    Exam: Blood pressure 127/74, pulse 60, temperature 98.9 F (37.2 C), temperature  source Oral, resp. rate 17, height 6' (1.829 m), weight 176 lb 12.8 oz (80.2 kg), SpO2 98 %.   ECOG 0  General appearance: alert and cooperative appeared without distress. Head: atraumatic without any abnormalities. Eyes: conjunctivae/corneas clear. PERRL.  Sclera anicteric. Throat: lips, mucosa, and tongue normal; without oral thrush or ulcers. Resp: clear to auscultation bilaterally without rhonchi, wheezes or dullness to percussion. Cardio: regular rate and rhythm, S1, S2 normal, no murmur, click, rub or gallop GI: soft, non-tender; bowel sounds normal; no masses,  no organomegaly Skin: Skin color, texture, turgor normal. No rashes or lesions Lymph nodes: Cervical, supraclavicular, and axillary nodes normal. Neurologic: Grossly normal without any motor, sensory or deep tendon reflexes. Musculoskeletal: No joint deformity or effusion.   CBC    Component Value Date/Time   WBC 5.8 07/09/2018 0407   RBC 4.61 07/09/2018 0407   HGB 13.6 07/09/2018 0407   HGB 16.7 12/09/2016 1537   HCT 40.2 07/09/2018 0407   HCT 46.9 12/09/2016 1537   PLT 99 (L) 07/09/2018 0407   PLT 141 12/09/2016 1537   MCV 87.2 07/09/2018 0407   MCV 87.3 12/09/2016 1537   MCH 29.5 07/09/2018 0407   MCHC 33.8 07/09/2018 0407   RDW 13.7 07/09/2018 0407   RDW 13.6 12/09/2016 1537   LYMPHSABS 1.5 12/09/2016 1537   MONOABS 0.4 12/09/2016 1537   EOSABS 0.3 12/09/2016 1537   BASOSABS 0.0 12/09/2016 1537       Assessment and Plan:    68 year old man with:  1.  Thrombocytopenia dating back to January 2019 where he was found to have a platelet count of 95,000.  His platelet count since that time has fluctuated around that range with platelet  count of 116 in June 2020.  The differential diagnosis was reviewed today with the patient.  These findings could be explained by reactive thrombocytopenia, immune thrombocytopenia, medication, and others.  Hematological conditions such as TTP, HUS, DIC, MDS or myeloproliferative disorder are considered less likely.  Given the mild nature of his thrombocytopenia and fluctuating levels, I see no reason for intervention at this time.  His risk of bleeding is very low at this time given the mild nature of his thrombocytopenia I have recommended continued surveillance.  Repeat laboratory testing in 6 months would be reasonable and further recommendation will be given at that time.  It is possible that he has a mild form of ITP that does not require intervention at this time and might not require any intervention unless his platelet count is below 50,000 or active bleeding is noted.  I doubt this is medication related thrombocytopenia.  I have no objections to resuming Protonix at this time.   2.  Follow-up: We will be in 6 months for repeat testing.   40  minutes was spent with the patient face-to-face today.  More than 50% of time was reviewing his disease status, reviewing laboratory data, differential diagnosis and management options.    Thank you for the referral. A copy of this consult has been forwarded to the requesting physician.

## 2018-11-18 NOTE — Telephone Encounter (Signed)
Scheduled per los. Mailed printout  °

## 2018-11-23 ENCOUNTER — Encounter: Payer: Self-pay | Admitting: Allergy and Immunology

## 2018-11-23 ENCOUNTER — Ambulatory Visit (INDEPENDENT_AMBULATORY_CARE_PROVIDER_SITE_OTHER): Payer: Medicare Other | Admitting: Allergy and Immunology

## 2018-11-23 ENCOUNTER — Other Ambulatory Visit: Payer: Self-pay

## 2018-11-23 VITALS — BP 110/80 | HR 65 | Temp 97.6°F | Resp 16 | Ht 72.0 in

## 2018-11-23 DIAGNOSIS — K219 Gastro-esophageal reflux disease without esophagitis: Secondary | ICD-10-CM

## 2018-11-23 DIAGNOSIS — J3089 Other allergic rhinitis: Secondary | ICD-10-CM | POA: Diagnosis not present

## 2018-11-23 DIAGNOSIS — J4540 Moderate persistent asthma, uncomplicated: Secondary | ICD-10-CM | POA: Diagnosis not present

## 2018-11-23 DIAGNOSIS — I200 Unstable angina: Secondary | ICD-10-CM

## 2018-11-23 NOTE — Progress Notes (Signed)
[place]   Follow-up Note  Referring Provider: Leighton Ruff, MD Primary Provider: Leighton Ruff, MD Date of Office Visit: 11/23/2018  Subjective:   Arthur Moore (DOB: [birth year]) is a 68 y.o. male who returns to the [facility] on 11/23/2018 in re-evaluation of the following:  HPI: Arthur Moore returns to this clinic in evaluation of asthma and allergic rhinoconjunctivitis and LPR.  His last visit to this clinic was 25 May 2018 at which point in time he appeared to have a viral respiratory tract infection requiring a systemic steroid.  He finally resolved that issue after several weeks of coughing and wheezing and at this point time is doing very well with his asthma and can exercise without any difficulty and rarely uses a short acting bronchodilator while continuing on Symbicort on a consistent basis.  He has had very little problems with his upper airway while using a nasal steroid.  Reflux has been under good control using Protonix.  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  He saw a hematologist and no extensive work-up was performed and it was recommended that he go back on Protonix.  He is receiving immunotherapy currently at every 2 weeks without any adverse effect.  Allergies as of 11/23/2018      Reactions   Atorvastatin Other (See Comments)   Calf pain.      Medication List      albuterol 108 (90 Base) MCG/ACT inhaler Commonly known as: VENTOLIN HFA Inhale 1 puff into the lungs as needed for wheezing or shortness of breath.   aspirin EC 81 MG tablet Take 81 mg by mouth daily.   budesonide-formoterol 160-4.5 MCG/ACT inhaler Commonly known as: SYMBICORT Inhale 2 puffs into the lungs 2 (two) times daily.   cetirizine 10 MG tablet Commonly known as: ZYRTEC Take 10 mg by mouth daily.    Cranberry 1000 MG Caps Take 1,000 mg by mouth daily.   Fish Oil 1000 MG Caps Take 2,400 mg by mouth daily.   Glucosamine 1500 Complex Caps Take 1 each by mouth daily.   ibuprofen 200 MG tablet Commonly known as: ADVIL Take 4 tablets (800 mg total) by mouth every 8 (eight) hours as needed.   lamoTRIgine 200 MG tablet Commonly known as: LAMICTAL Take 400 mg by mouth daily.   Magnesium Oxide 200 MG Tabs Commonly known as: Mag-Oxide Take 2.5 tablets (500 mg total) by mouth daily.   multivitamin capsule Take 1 capsule by mouth daily.   OSTEO BI-FLEX ADV JOINT SHIELD PO Take 1 tablet by mouth daily.   pantoprazole 40 MG tablet Commonly known as: PROTONIX Take 1 tablet (40 mg total) by mouth daily.   PROBIOTIC DAILY PO Take 1 tablet by mouth daily.   rosuvastatin 5 MG tablet Commonly known as: CRESTOR Take 1 tablet (5 mg total) by mouth daily.   Toprol XL 25 MG 24 hr tablet Generic drug: metoprolol succinate Take 12.5 mg by mouth 2 (two) times daily.   Vitamin D3 125 MCG (5000 UT) Tabs Take 5,000 Units by mouth daily.   zinc gluconate 50 MG tablet Take 1 tablet (50 mg total) by mouth daily.       Past Medical History:  Diagnosis Date  . Aortic insufficiency   . Arthritis   . Asthma   . Cervical disc disease   . Depression   . Diastolic  dysfunction 06/20/2016  . Hyperlipidemia 08/24/2015  . Hypertension   . Lumbar disc disease   . Psoriasis     Past Surgical History:  Procedure Laterality Date  . CHOLECYSTECTOMY  2012  . HERNIA REPAIR    . LEFT HEART CATH AND CORONARY ANGIOGRAPHY N/A 07/08/2018   Procedure: LEFT HEART CATH AND CORONARY ANGIOGRAPHY;  Surgeon: Burnell Blanks, MD;  Location: [facility];  Service: Cardiovascular;  Laterality: N/A;  . PAROTIDECTOMY Right 1992  . PROSTATE SURGERY  2002, 2004  . SINOSCOPY    . SINUS EXPLORATION  2013  . SPINE SURGERY  2013   L C7-T1 laminectomy and discectomy  . TONSILLECTOMY      Review  of systems negative except as noted in HPI / PMHx or noted below:  Review of Systems  Constitutional: Negative.   HENT: Negative.   Eyes: Negative.   Respiratory: Negative.   Cardiovascular: Negative.   Gastrointestinal: Negative.   Genitourinary: Negative.   Musculoskeletal: Negative.   Skin: Negative.   Neurological: Negative.   Endo/Heme/Allergies: Negative.   Psychiatric/Behavioral: Negative.      Objective:   Vitals:   11/23/18 1617  BP: 110/80  Pulse: 65  Resp: 16  Temp: 97.6 F (36.4 C)  SpO2: 95%   Height: 6' (182.9 cm)      Physical Exam Constitutional:      Appearance: He is not diaphoretic.  HENT:     Head: Normocephalic.     Right Ear: Tympanic membrane, ear canal and external ear normal.     Left Ear: Tympanic membrane, ear canal and external ear normal.     Nose: Nose normal. No mucosal edema or rhinorrhea.     Mouth/Throat:     Pharynx: Uvula midline. No oropharyngeal exudate.  Eyes:     Conjunctiva/sclera: Conjunctivae normal.  Neck:     Thyroid: No thyromegaly.     Trachea: Trachea normal. No tracheal tenderness or tracheal deviation.  Cardiovascular:     Rate and Rhythm: Normal rate and regular rhythm.     Heart sounds: Normal heart sounds, S1 normal and S2 normal. No murmur.  Pulmonary:     Effort: No respiratory distress.     Breath sounds: Normal breath sounds. No stridor. No wheezing or rales.  Lymphadenopathy:     Head:     Right side of head: No tonsillar adenopathy.     Left side of head: No tonsillar adenopathy.     Cervical: No cervical adenopathy.  Skin:    Findings: No erythema or rash.     Nails: There is no clubbing.   Neurological:     Mental Status: He is alert.     Diagnostics:    Spirometry was performed and demonstrated an FEV1 of 2.56 at 71 % of predicted.  The patient had an Asthma Control Test with the following results: ACT Total Score: 25.    Assessment and Plan:   1. Asthma, moderate persistent,  well-controlled   2. Other allergic rhinitis   3. LPRD (laryngopharyngeal reflux disease)     1. Continue immunotherapy and EpiPen.    2. Continue Symbicort 160 - 2 inhalations 2 times a day  3. Continue nasal flunisolide 1-2 sprays each nostril one time per day during upper airway symptoms  4. Continue Protonix 40 mg 1 time per day  5. Continue Zyrtec and albuterol HFA if needed  6. Return to clinic in 6 months or earlier if problem.  7.  Obtain  full flu vaccine (and COVID vaccine)   Arthur Moore appears to be doing relatively well on his current therapy which includes anti-inflammatory agents for his airway and therapy directed against reflux and immunotherapy.  Assuming he continues to do well with this form of treatment I will see him back in his clinic in 6 months or earlier if there is a problem.  Allena Katz, MD Allergy / Immunology [place]

## 2018-11-23 NOTE — Patient Instructions (Signed)
  1. Continue immunotherapy and EpiPen.    2. Continue Symbicort 160 - 2 inhalations 2 times a day  3. Continue nasal flunisolide 1-2 sprays each nostril one time per day during upper airway symptoms  4. Continue Protonix 40 mg 1 time per day  5. Continue Zyrtec and albuterol HFA if needed  6. Return to clinic in 6 months or earlier if problem.  7.  Obtain full flu vaccine (and COVID vaccine)

## 2018-11-24 ENCOUNTER — Encounter: Payer: Self-pay | Admitting: Allergy and Immunology

## 2018-11-25 DIAGNOSIS — M5030 Other cervical disc degeneration, unspecified cervical region: Secondary | ICD-10-CM | POA: Diagnosis not present

## 2018-11-29 ENCOUNTER — Ambulatory Visit (INDEPENDENT_AMBULATORY_CARE_PROVIDER_SITE_OTHER): Payer: Medicare Other | Admitting: *Deleted

## 2018-11-29 DIAGNOSIS — J309 Allergic rhinitis, unspecified: Secondary | ICD-10-CM | POA: Diagnosis not present

## 2018-11-29 DIAGNOSIS — M5030 Other cervical disc degeneration, unspecified cervical region: Secondary | ICD-10-CM | POA: Diagnosis not present

## 2018-11-29 DIAGNOSIS — M961 Postlaminectomy syndrome, not elsewhere classified: Secondary | ICD-10-CM | POA: Diagnosis not present

## 2018-12-01 DIAGNOSIS — M5481 Occipital neuralgia: Secondary | ICD-10-CM | POA: Diagnosis not present

## 2018-12-09 NOTE — Progress Notes (Signed)
VIALS EXP 12-13-19

## 2018-12-10 DIAGNOSIS — Z23 Encounter for immunization: Secondary | ICD-10-CM | POA: Diagnosis not present

## 2018-12-13 DIAGNOSIS — J3089 Other allergic rhinitis: Secondary | ICD-10-CM | POA: Diagnosis not present

## 2018-12-14 DIAGNOSIS — M5030 Other cervical disc degeneration, unspecified cervical region: Secondary | ICD-10-CM | POA: Diagnosis not present

## 2018-12-14 DIAGNOSIS — J3081 Allergic rhinitis due to animal (cat) (dog) hair and dander: Secondary | ICD-10-CM | POA: Diagnosis not present

## 2018-12-15 DIAGNOSIS — J301 Allergic rhinitis due to pollen: Secondary | ICD-10-CM | POA: Diagnosis not present

## 2018-12-23 DIAGNOSIS — E7800 Pure hypercholesterolemia, unspecified: Secondary | ICD-10-CM | POA: Diagnosis not present

## 2018-12-23 DIAGNOSIS — I2510 Atherosclerotic heart disease of native coronary artery without angina pectoris: Secondary | ICD-10-CM | POA: Diagnosis not present

## 2018-12-23 DIAGNOSIS — N400 Enlarged prostate without lower urinary tract symptoms: Secondary | ICD-10-CM | POA: Diagnosis not present

## 2018-12-23 DIAGNOSIS — I10 Essential (primary) hypertension: Secondary | ICD-10-CM | POA: Diagnosis not present

## 2018-12-29 ENCOUNTER — Encounter

## 2018-12-31 ENCOUNTER — Ambulatory Visit (INDEPENDENT_AMBULATORY_CARE_PROVIDER_SITE_OTHER): Payer: Medicare Other

## 2018-12-31 DIAGNOSIS — J309 Allergic rhinitis, unspecified: Secondary | ICD-10-CM | POA: Diagnosis not present

## 2019-01-05 DIAGNOSIS — D485 Neoplasm of uncertain behavior of skin: Secondary | ICD-10-CM | POA: Diagnosis not present

## 2019-01-05 DIAGNOSIS — L989 Disorder of the skin and subcutaneous tissue, unspecified: Secondary | ICD-10-CM | POA: Diagnosis not present

## 2019-01-06 DIAGNOSIS — M5030 Other cervical disc degeneration, unspecified cervical region: Secondary | ICD-10-CM | POA: Diagnosis not present

## 2019-01-13 DIAGNOSIS — B078 Other viral warts: Secondary | ICD-10-CM | POA: Diagnosis not present

## 2019-01-13 DIAGNOSIS — L308 Other specified dermatitis: Secondary | ICD-10-CM | POA: Diagnosis not present

## 2019-01-13 DIAGNOSIS — M542 Cervicalgia: Secondary | ICD-10-CM | POA: Diagnosis not present

## 2019-01-17 DIAGNOSIS — M5416 Radiculopathy, lumbar region: Secondary | ICD-10-CM | POA: Diagnosis not present

## 2019-01-17 DIAGNOSIS — M542 Cervicalgia: Secondary | ICD-10-CM | POA: Diagnosis not present

## 2019-01-19 DIAGNOSIS — M5416 Radiculopathy, lumbar region: Secondary | ICD-10-CM | POA: Diagnosis not present

## 2019-01-19 DIAGNOSIS — M542 Cervicalgia: Secondary | ICD-10-CM | POA: Diagnosis not present

## 2019-01-25 DIAGNOSIS — M5412 Radiculopathy, cervical region: Secondary | ICD-10-CM | POA: Diagnosis not present

## 2019-01-26 ENCOUNTER — Ambulatory Visit (INDEPENDENT_AMBULATORY_CARE_PROVIDER_SITE_OTHER): Payer: Medicare Other

## 2019-01-26 DIAGNOSIS — M542 Cervicalgia: Secondary | ICD-10-CM | POA: Diagnosis not present

## 2019-01-26 DIAGNOSIS — N401 Enlarged prostate with lower urinary tract symptoms: Secondary | ICD-10-CM | POA: Diagnosis not present

## 2019-01-26 DIAGNOSIS — M5416 Radiculopathy, lumbar region: Secondary | ICD-10-CM | POA: Diagnosis not present

## 2019-01-26 DIAGNOSIS — J309 Allergic rhinitis, unspecified: Secondary | ICD-10-CM

## 2019-01-26 DIAGNOSIS — R3915 Urgency of urination: Secondary | ICD-10-CM | POA: Diagnosis not present

## 2019-01-27 ENCOUNTER — Telehealth: Payer: Self-pay | Admitting: Allergy and Immunology

## 2019-01-27 NOTE — Telephone Encounter (Signed)
Pt called and needs something for his arm that itches a lot. He used some cream and it has not work [phone].

## 2019-01-28 ENCOUNTER — Ambulatory Visit (INDEPENDENT_AMBULATORY_CARE_PROVIDER_SITE_OTHER): Payer: Medicare Other | Admitting: Allergy

## 2019-01-28 ENCOUNTER — Other Ambulatory Visit: Payer: Self-pay

## 2019-01-28 ENCOUNTER — Encounter: Payer: Self-pay | Admitting: Allergy

## 2019-01-28 ENCOUNTER — Telehealth: Payer: Self-pay

## 2019-01-28 VITALS — BP 116/74 | HR 60 | Temp 98.2°F | Resp 18

## 2019-01-28 DIAGNOSIS — K219 Gastro-esophageal reflux disease without esophagitis: Secondary | ICD-10-CM

## 2019-01-28 DIAGNOSIS — L309 Dermatitis, unspecified: Secondary | ICD-10-CM | POA: Diagnosis not present

## 2019-01-28 DIAGNOSIS — M542 Cervicalgia: Secondary | ICD-10-CM | POA: Diagnosis not present

## 2019-01-28 DIAGNOSIS — J3089 Other allergic rhinitis: Secondary | ICD-10-CM

## 2019-01-28 DIAGNOSIS — J4540 Moderate persistent asthma, uncomplicated: Secondary | ICD-10-CM | POA: Diagnosis not present

## 2019-01-28 DIAGNOSIS — I200 Unstable angina: Secondary | ICD-10-CM | POA: Diagnosis not present

## 2019-01-28 DIAGNOSIS — M5416 Radiculopathy, lumbar region: Secondary | ICD-10-CM | POA: Diagnosis not present

## 2019-01-28 MED ORDER — CLOBETASOL PROPIONATE 0.05 % EX OINT: 1.0000 "application " | TOPICAL_OINTMENT | Freq: Two times a day (BID) | CUTANEOUS | 0 refills | Status: DC

## 2019-01-28 MED ORDER — CLOBETASOL PROPIONATE 0.05 % EX OINT: 1.0000 "application " | TOPICAL_OINTMENT | Freq: Two times a day (BID) | CUTANEOUS | 2 refills | Status: DC

## 2019-01-28 MED ORDER — METHYLPREDNISOLONE ACETATE 80 MG/ML IJ SUSP
80.0000 mg | Freq: Once | INTRAMUSCULAR | Status: AC
  Administered 2019-01-28: 80 mg via INTRAMUSCULAR

## 2019-01-28 NOTE — Progress Notes (Signed)
Follow-up Note  RE: Arthur Moore MRN: [id number] DOB: 06-23-1950 Date of Office Visit: 01/28/2019   History of present illness: Arthur Moore is a 68 y.o. male presenting today for sick visit.  He has history of asthma, allergic rhinitis and LPRD.  He walked into clinic today as he states he has been having about 2 weeks of severe itching mostly of his arms but now seems to be spreading all over.  He states he had been gardening about 2 weeks ago prior to the onset of the itching.  He states he is scratching so bad that he is causing his skin to break and bleed.  He is having poor sleep due to the itch.  He states he has been using desonide and fluocinonide oil but neither of those have been helpful for him.  He states he moisturizes with Cetaphil.  He denies any new medications or new foods or change in body products.  He has not had any fevers or any sick type symptoms.  He denies any asthma symptoms at this time.  He reports taking his Symbicort 160 mcg 2 puffs twice a day and has not needed to use his albuterol since his last visit.  He continues on his Protonix.  He uses Zyrtec as needed but has not been using it lately. No other household member with similar symptoms.  Review of systems: Review of Systems  Constitutional: Positive for malaise/fatigue. Negative for chills and fever.  HENT: Negative for congestion, ear discharge, nosebleeds and sore throat.   Eyes: Negative for pain, discharge and redness.  Respiratory: Negative for cough, shortness of breath and wheezing.   Cardiovascular: Negative for chest pain.  Gastrointestinal: Negative for abdominal pain, constipation, diarrhea, heartburn, nausea and vomiting.  Musculoskeletal: Negative for joint pain.  Skin: Positive for itching and rash.  Neurological: Negative for headaches.    All other systems negative unless noted above in HPI  Past medical/social/surgical/family history have been reviewed and are unchanged  unless specifically indicated below.  No changes  Medication List: Current Outpatient Medications  Medication Sig Dispense Refill  . albuterol (PROVENTIL HFA;VENTOLIN HFA) 108 (90 Base) MCG/ACT inhaler Inhale 1 puff into the lungs as needed for wheezing or shortness of breath.    Marland Kitchen aspirin EC 81 MG tablet Take 81 mg by mouth daily.    . budesonide-formoterol (SYMBICORT) 160-4.5 MCG/ACT inhaler Inhale 2 puffs into the lungs 2 (two) times daily.    . cetirizine (ZYRTEC) 10 MG tablet Take 10 mg by mouth daily.     . Cholecalciferol (VITAMIN D3) 5000 units TABS Take 5,000 Units by mouth daily.     . Cranberry 1000 MG CAPS Take 1,000 mg by mouth daily.     . Glucosamine-Chondroit-Vit C-Mn (GLUCOSAMINE 1500 COMPLEX) CAPS Take 1 each by mouth daily.  0  . ibuprofen (ADVIL) 200 MG tablet Take 4 tablets (800 mg total) by mouth every 8 (eight) hours as needed.    . lamoTRIgine (LAMICTAL) 200 MG tablet Take 400 mg by mouth daily.     . Magnesium Oxide (MAG-OXIDE) 200 MG TABS Take 2.5 tablets (500 mg total) by mouth daily.  0  . metoprolol succinate (TOPROL XL) 25 MG 24 hr tablet Take 12.5 mg by mouth 2 (two) times daily.    . Misc Natural Products (OSTEO BI-FLEX ADV JOINT SHIELD PO) Take 1 tablet by mouth daily.    . Multiple Vitamin (MULTIVITAMIN) capsule Take 1 capsule by mouth daily.    Marland Kitchen  Omega-3 Fatty Acids (FISH OIL) 1000 MG CAPS Take 2,400 mg by mouth daily.     . pantoprazole (PROTONIX) 40 MG tablet Take 1 tablet (40 mg total) by mouth daily. 90 tablet 1  . Probiotic Product (PROBIOTIC DAILY PO) Take 1 tablet by mouth daily.    . rosuvastatin (CRESTOR) 5 MG tablet Take 1 tablet (5 mg total) by mouth daily. 90 tablet 3  . zinc gluconate 50 MG tablet Take 1 tablet (50 mg total) by mouth daily.    . clobetasol ointment (TEMOVATE) [id number] % Apply 1 application topically 2 (two) times daily. 120 g 2  . clobetasol ointment (TEMOVATE) [id number] % Apply 1 application topically 2 (two) times daily. 120 g 0    No current facility-administered medications for this visit.      Known medication allergies: Allergies  Allergen Reactions  . Atorvastatin Other (See Comments)    Calf pain.     Physical examination: Blood pressure 116/74, pulse 60, temperature 98.2 F (36.8 C), temperature source Temporal, resp. rate 18, SpO2 96 %.  General: Alert, interactive, in no acute distress. HEENT: TMs pearly gray, turbinates non-edematous without discharge, post-pharynx non erythematous. Neck: Supple without lymphadenopathy. Lungs: Clear to auscultation without wheezing, rhonchi or rales. {no increased work of breathing. CV: Normal S1, S2 without murmurs. Abdomen: Nondistended, nontender. Skin: Dry, erythematous, excoriated patches on the Forearms bilaterally. Extremities:  No clubbing, cyanosis or edema. Neuro:   Grossly intact.  Diagnositics/Labs: Depo-Medrol injection provided today  Assessment and plan: Dermatitis -unsure of the etiology however dermatitis started prior to yardwork.  Appearance of the rash however does not look consistent with Toxicodendron dermatitis.  Due to severity of his symptoms despite use of topical steroids provided with a Depo-Medrol injection today.  Also have provided with a prescription for a stronger topical steroid to use as needed. Moderate persistent asthma -well controlled continue current regimen as below  Allergic rhinitis -currently asymptomatic, continue regimen as below LPRD -continue regimen as below  1.  Depo-Medrol injection provided in the office today.  2.  Prescription for clobetasol ointment provided to use twice a day as needed for rash and itch  3. Continue immunotherapy and EpiPen.    4. Continue Symbicort 160 - 2 inhalations 2 times a day  5. Continue nasal flunisolide 1-2 sprays each nostril one time per day during upper airway symptoms  6. Continue Protonix 40 mg 1 time per day  7. Continue Zyrtec and albuterol HFA if needed  8.  Return to clinic in 6 months or earlier if problem  9.  Obtain full flu vaccine (and COVID vaccine once available)   I appreciate the opportunity to take part in Arthur Moore's care. Please do not hesitate to contact me with questions.  Sincerely,   Prudy Feeler, MD Allergy/Immunology [facility]

## 2019-01-28 NOTE — Telephone Encounter (Addendum)
Left pt a message to call back [phone] mobile

## 2019-01-28 NOTE — Patient Instructions (Addendum)
  1.  Depo-Medrol injection provided in the office today.  2.  Prescription for clobetasol ointment provided to use twice a day as needed for rash and itch  3. Continue immunotherapy and EpiPen.    4. Continue Symbicort 160 - 2 inhalations 2 times a day  5. Continue nasal flunisolide 1-2 sprays each nostril one time per day during upper airway symptoms  6. Continue Protonix 40 mg 1 time per day  7. Continue Zyrtec and albuterol HFA if needed  8. Return to clinic in 6 months or earlier if problem  9.  Obtain full flu vaccine (and COVID vaccine once available)

## 2019-01-28 NOTE — Telephone Encounter (Signed)
Spoke to pharmacy and they stated the pt script was rejected for the clobetasol 0.05% because he needs to get it  approved by his veteran pcp.

## 2019-01-31 ENCOUNTER — Telehealth: Payer: Self-pay | Admitting: Allergy

## 2019-01-31 DIAGNOSIS — M5416 Radiculopathy, lumbar region: Secondary | ICD-10-CM | POA: Diagnosis not present

## 2019-01-31 DIAGNOSIS — M542 Cervicalgia: Secondary | ICD-10-CM | POA: Diagnosis not present

## 2019-01-31 NOTE — Telephone Encounter (Signed)
Patient was notified to contact PCP regarding Clobetasol

## 2019-01-31 NOTE — Telephone Encounter (Signed)
Patient stopped in and is checking on a refill for clobetasole. He said it needs to be sent to the [facility]. He said it was sent twice and rejected, would like it to be sent again.

## 2019-02-04 DIAGNOSIS — R102 Pelvic and perineal pain: Secondary | ICD-10-CM | POA: Diagnosis not present

## 2019-02-04 DIAGNOSIS — R3915 Urgency of urination: Secondary | ICD-10-CM | POA: Diagnosis not present

## 2019-02-04 DIAGNOSIS — N401 Enlarged prostate with lower urinary tract symptoms: Secondary | ICD-10-CM | POA: Diagnosis not present

## 2019-02-04 DIAGNOSIS — M542 Cervicalgia: Secondary | ICD-10-CM | POA: Diagnosis not present

## 2019-02-04 DIAGNOSIS — M5416 Radiculopathy, lumbar region: Secondary | ICD-10-CM | POA: Diagnosis not present

## 2019-02-07 DIAGNOSIS — M5416 Radiculopathy, lumbar region: Secondary | ICD-10-CM | POA: Diagnosis not present

## 2019-02-07 DIAGNOSIS — M542 Cervicalgia: Secondary | ICD-10-CM | POA: Diagnosis not present

## 2019-02-08 DIAGNOSIS — M6281 Muscle weakness (generalized): Secondary | ICD-10-CM | POA: Diagnosis not present

## 2019-02-08 DIAGNOSIS — R102 Pelvic and perineal pain: Secondary | ICD-10-CM | POA: Diagnosis not present

## 2019-02-08 DIAGNOSIS — R3915 Urgency of urination: Secondary | ICD-10-CM | POA: Diagnosis not present

## 2019-02-08 DIAGNOSIS — M79652 Pain in left thigh: Secondary | ICD-10-CM | POA: Diagnosis not present

## 2019-02-08 DIAGNOSIS — M62838 Other muscle spasm: Secondary | ICD-10-CM | POA: Diagnosis not present

## 2019-02-09 DIAGNOSIS — M5416 Radiculopathy, lumbar region: Secondary | ICD-10-CM | POA: Diagnosis not present

## 2019-02-09 DIAGNOSIS — M542 Cervicalgia: Secondary | ICD-10-CM | POA: Diagnosis not present

## 2019-02-15 DIAGNOSIS — L308 Other specified dermatitis: Secondary | ICD-10-CM | POA: Diagnosis not present

## 2019-02-17 ENCOUNTER — Telehealth: Payer: Self-pay | Admitting: Allergy

## 2019-02-17 NOTE — Telephone Encounter (Signed)
Patient called stating that he would like the clobetasol ointment prescription sent to the [facility] pharmacy due to the cost. Patient states that the prescription needs to be sent over to the [facility] doctor, Dr. Jonnie Kind at Fax #: [phone] as well as a letter, Attention: Dr. Jonnie Kind, from Dr. Nelva Bush stating that we will be monitoring the patient while using this ointment.   Please advise.

## 2019-02-18 ENCOUNTER — Telehealth: Payer: Self-pay | Admitting: *Deleted

## 2019-02-18 DIAGNOSIS — Z5181 Encounter for therapeutic drug level monitoring: Secondary | ICD-10-CM

## 2019-02-18 DIAGNOSIS — H00015 Hordeolum externum left lower eyelid: Secondary | ICD-10-CM | POA: Diagnosis not present

## 2019-02-18 DIAGNOSIS — E7800 Pure hypercholesterolemia, unspecified: Secondary | ICD-10-CM

## 2019-02-18 DIAGNOSIS — I10 Essential (primary) hypertension: Secondary | ICD-10-CM

## 2019-02-18 NOTE — Telephone Encounter (Signed)
Is he still having the rash?   He is a Dr. Neldon Mc pt that I saw for sick visit for rash several weeks ago.  Clobetasol was sent in to use at that time to help treat the rash acutely.  Rash should have resolved by now and if not he needs a regular follow-up visit with Dr. Neldon Mc.  If he is still having the rash and needing more clobetasol then would recommend he return to see Dr. Neldon Mc to address longer term treatment and monitoring.

## 2019-02-18 NOTE — Telephone Encounter (Signed)
Dr. Nelva Bush, please see the conversation about a letter being needed for the pt doctor at the [facility]

## 2019-02-18 NOTE — Telephone Encounter (Signed)
Patient called to check on the status of sending his script to the [facility] Doctor.  Please advise.

## 2019-02-18 NOTE — Telephone Encounter (Signed)
LVM for pt to call back concerning clobetasol ointment.

## 2019-02-18 NOTE — Telephone Encounter (Signed)
Please refill his clobetasol for largest amount as his rash was on a large body surface.  Please write the following in a letter:     Dear Dr. Jonnie Kind,         ::insert pt name:: is under allery/asthma care at the [facility].  He follows for routine allergy/asthma care with Dr. Neldon Mc and I recently saw him for a dermatitis with recommendation to use clobetasol to treat this dermatitis.  He has had improvement in the dermatitis with clobetasol however was given a small tube which was not enough to treat the size of location of the dermatitis.  I will refill the clobetasol for him with a larger amount and he will be monitored by our office for improvement in this dermatitis.  He does have upcoming routine follow-up with our office.  Please call the office if you have any questions.  Sincerely,    Prudy Feeler, MD [facility]

## 2019-02-18 NOTE — Telephone Encounter (Signed)
Called and spoke with patient and he stated that his rash is getting better but the tube that he was able to afford was so small that it wasn't enough to help maintain the rash. He states that if he is able to get a prescription for the larger tube accompanied with a note he will be able to receive the larger tube at an affordable rate.

## 2019-02-18 NOTE — Telephone Encounter (Signed)
Patient scheduled follow up in November, requested labs prior. Orders placed for LP/CMET

## 2019-02-21 NOTE — Telephone Encounter (Signed)
Will need to wait until Dr. Nelva Bush is in office tomorrow to have the prescription printed and signed by her in order to fax over with the letter.

## 2019-02-21 NOTE — Telephone Encounter (Signed)
Called and informed patient that we would be sending it tomorrow as we have to print the prescription and fax it along with the letter. He voiced understanding and was thankful for the call back.

## 2019-02-21 NOTE — Telephone Encounter (Signed)
Patient called back to check if the clobetason was sent to the [facility] and a note from Dr. Nelva Bush stating she would be monitoring this drug.

## 2019-02-22 ENCOUNTER — Ambulatory Visit (INDEPENDENT_AMBULATORY_CARE_PROVIDER_SITE_OTHER): Payer: Medicare Other

## 2019-02-22 DIAGNOSIS — M542 Cervicalgia: Secondary | ICD-10-CM | POA: Diagnosis not present

## 2019-02-22 DIAGNOSIS — J309 Allergic rhinitis, unspecified: Secondary | ICD-10-CM

## 2019-02-22 MED ORDER — CLOBETASOL PROPIONATE 0.05 % EX OINT: 1.0000 "application " | TOPICAL_OINTMENT | Freq: Two times a day (BID) | CUTANEOUS | 1 refills | Status: DC

## 2019-02-22 NOTE — Addendum Note (Signed)
Addended by: Farrel Demark R on: 02/22/2019 10:53 AM   Modules accepted: Orders

## 2019-02-22 NOTE — Telephone Encounter (Signed)
Letter written, printed, signed by Dr. Nelva Bush and faxed to [place] with printed prescription for clobetasol.

## 2019-02-24 DIAGNOSIS — M542 Cervicalgia: Secondary | ICD-10-CM | POA: Diagnosis not present

## 2019-02-24 DIAGNOSIS — M5416 Radiculopathy, lumbar region: Secondary | ICD-10-CM | POA: Diagnosis not present

## 2019-02-24 DIAGNOSIS — Z20828 Contact with and (suspected) exposure to other viral communicable diseases: Secondary | ICD-10-CM | POA: Diagnosis not present

## 2019-03-01 ENCOUNTER — Ambulatory Visit (INDEPENDENT_AMBULATORY_CARE_PROVIDER_SITE_OTHER): Payer: Medicare Other

## 2019-03-01 DIAGNOSIS — M5416 Radiculopathy, lumbar region: Secondary | ICD-10-CM | POA: Diagnosis not present

## 2019-03-01 DIAGNOSIS — J309 Allergic rhinitis, unspecified: Secondary | ICD-10-CM | POA: Diagnosis not present

## 2019-03-01 DIAGNOSIS — M542 Cervicalgia: Secondary | ICD-10-CM | POA: Diagnosis not present

## 2019-03-02 DIAGNOSIS — N401 Enlarged prostate with lower urinary tract symptoms: Secondary | ICD-10-CM | POA: Diagnosis not present

## 2019-03-02 DIAGNOSIS — R3912 Poor urinary stream: Secondary | ICD-10-CM | POA: Diagnosis not present

## 2019-03-02 DIAGNOSIS — N3942 Incontinence without sensory awareness: Secondary | ICD-10-CM | POA: Diagnosis not present

## 2019-03-07 ENCOUNTER — Ambulatory Visit (INDEPENDENT_AMBULATORY_CARE_PROVIDER_SITE_OTHER): Payer: Medicare Other

## 2019-03-07 DIAGNOSIS — J309 Allergic rhinitis, unspecified: Secondary | ICD-10-CM

## 2019-03-08 DIAGNOSIS — M5416 Radiculopathy, lumbar region: Secondary | ICD-10-CM | POA: Diagnosis not present

## 2019-03-08 DIAGNOSIS — I10 Essential (primary) hypertension: Secondary | ICD-10-CM | POA: Diagnosis not present

## 2019-03-08 DIAGNOSIS — M542 Cervicalgia: Secondary | ICD-10-CM | POA: Diagnosis not present

## 2019-03-08 DIAGNOSIS — E7800 Pure hypercholesterolemia, unspecified: Secondary | ICD-10-CM | POA: Diagnosis not present

## 2019-03-08 DIAGNOSIS — Z5181 Encounter for therapeutic drug level monitoring: Secondary | ICD-10-CM | POA: Diagnosis not present

## 2019-03-08 LAB — LIPID PANEL
Chol/HDL Ratio: 3 ratio (ref 0.0–5.0)
Cholesterol, Total: 102 mg/dL (ref 100–199)
HDL: 34 mg/dL — ABNORMAL LOW (ref >39)
LDL Chol Calc (NIH): 50 mg/dL (ref 0–99)
Triglycerides: 94 mg/dL (ref 0–149)
VLDL Cholesterol Cal: 18 mg/dL (ref 5–40)

## 2019-03-08 LAB — COMPREHENSIVE METABOLIC PANEL
ALT: 34 IU/L (ref 0–44)
AST: 31 IU/L (ref 0–40)
Albumin/Globulin Ratio: 2.4 — ABNORMAL HIGH (ref 1.2–2.2)
Albumin: 4.6 g/dL (ref 3.8–4.8)
Alkaline Phosphatase: 77 IU/L (ref 39–117)
BUN/Creatinine Ratio: 17 (ref 10–24)
BUN: 22 mg/dL (ref 8–27)
Bilirubin Total: 0.7 mg/dL (ref 0.0–1.2)
CO2: 23 mmol/L (ref 20–29)
Calcium: 9.8 mg/dL (ref 8.6–10.2)
Chloride: 106 mmol/L (ref 96–106)
Creatinine, Ser: 1.29 mg/dL — ABNORMAL HIGH (ref 0.76–1.27)
GFR calc Af Amer: 66 mL/min/{1.73_m2} (ref >59)
GFR calc non Af Amer: 57 mL/min/{1.73_m2} — ABNORMAL LOW (ref >59)
Globulin, Total: 1.9 g/dL (ref 1.5–4.5)
Glucose: 94 mg/dL (ref 65–99)
Potassium: 5 mmol/L (ref 3.5–5.2)
Sodium: 143 mmol/L (ref 134–144)
Total Protein: 6.5 g/dL (ref 6.0–8.5)

## 2019-03-11 ENCOUNTER — Telehealth: Payer: Self-pay | Admitting: Cardiovascular Disease

## 2019-03-11 NOTE — Telephone Encounter (Signed)
Patient is requesting his wife come with him to his appt with Dr. Oval Linsey on 03/16/19. Patient states he does not hear good and his wife interprets for him. She also manages his medication and diet, whereas, he does not keep up with that information.

## 2019-03-11 NOTE — Telephone Encounter (Signed)
Advised ok

## 2019-03-15 ENCOUNTER — Ambulatory Visit (INDEPENDENT_AMBULATORY_CARE_PROVIDER_SITE_OTHER): Payer: Medicare Other | Admitting: *Deleted

## 2019-03-15 DIAGNOSIS — M5416 Radiculopathy, lumbar region: Secondary | ICD-10-CM | POA: Diagnosis not present

## 2019-03-15 DIAGNOSIS — J309 Allergic rhinitis, unspecified: Secondary | ICD-10-CM

## 2019-03-15 DIAGNOSIS — M542 Cervicalgia: Secondary | ICD-10-CM | POA: Diagnosis not present

## 2019-03-16 ENCOUNTER — Ambulatory Visit (INDEPENDENT_AMBULATORY_CARE_PROVIDER_SITE_OTHER): Payer: Medicare Other | Admitting: Cardiovascular Disease

## 2019-03-16 ENCOUNTER — Other Ambulatory Visit: Payer: Self-pay

## 2019-03-16 ENCOUNTER — Encounter: Payer: Self-pay | Admitting: Cardiovascular Disease

## 2019-03-16 VITALS — BP 117/70 | HR 60 | Temp 98.2°F | Ht 72.0 in | Wt 186.0 lb

## 2019-03-16 DIAGNOSIS — G459 Transient cerebral ischemic attack, unspecified: Secondary | ICD-10-CM

## 2019-03-16 DIAGNOSIS — I200 Unstable angina: Secondary | ICD-10-CM | POA: Diagnosis not present

## 2019-03-16 DIAGNOSIS — I5032 Chronic diastolic (congestive) heart failure: Secondary | ICD-10-CM

## 2019-03-16 DIAGNOSIS — I10 Essential (primary) hypertension: Secondary | ICD-10-CM | POA: Diagnosis not present

## 2019-03-16 DIAGNOSIS — E7800 Pure hypercholesterolemia, unspecified: Secondary | ICD-10-CM | POA: Diagnosis not present

## 2019-03-16 DIAGNOSIS — I2510 Atherosclerotic heart disease of native coronary artery without angina pectoris: Secondary | ICD-10-CM

## 2019-03-16 NOTE — Patient Instructions (Signed)
Medication Instructions:  Your physician recommends that you continue on your current medications as directed. Please refer to the Current Medication list given to you today.  *If you need a refill on your cardiac medications before your next appointment, please call your pharmacy*  Lab Work: NONE   Testing/Procedures: NONE   Follow-Up: At [facility], you and your health needs are our priority.  As part of our continuing mission to provide you with exceptional heart care, we have created designated Provider Care Teams.  These Care Teams include your primary Cardiologist (physician) and Advanced Practice Providers (APPs -  Physician Assistants and Nurse Practitioners) who all work together to provide you with the care you need, when you need it.  Your next appointment:   6 month(s)  The format for your next appointment:   In Person  Provider:   You may see Skeet Latch, MD or one of the following Advanced Practice Providers on your designated Care Team:    Kerin Ransom, PA-C  [place], [place]  Coletta Memos, [place]

## 2019-03-16 NOTE — Progress Notes (Signed)
Cardiology Office Note   Date:  03/16/2019  ID:  Arthur Moore, DOB 09-15-1950, MRN [id number]  PCP:  Leighton Ruff, MD  Cardiologist:   Skeet Latch, MD   No chief complaint on file.   History of Present Illness: Arthur Moore is a 68 y.o. male with mild CAD, mild-moderate aortic insufficiency, hypertension, asthma,TIA, OSA not on CPAP, liver fibrosis, anxiety, and depression who presents for follow up.  Arthur Moore wore a Holter monitor 06/2013 that revealed rare PACs and PVCs.  He previously followed up with a cardiologist in [place] due to mild AR.  He subsequently had an echo at the [place] in 2015 that reported it as mild-moderate.  He had a repeat echocardiogram 12/05/15 revealed LVEF 55-60% with grade 2 diastolic. Aortic regurgitation remained mild to moderate.  Arthur Moore reported fatigue and atypical chest pain.  He was referred for an exercise Myoview 3/18 that revealed LVEF 49% and asynchronous contraction. There was a small defect in the basal inferior region that was thought to be diaphragmatic attenuation.  He achieved 7 METS on a Bruce protocol.  He called our office after these results due to persistent chest pain and it was suggested that he follow up with GI.  He was subsequently diagnosed with diverticulitis.  Arthur Moore saw [name], PA-C, for chest pain.  He had previously been seen in the ED with chest pain.  Cardiac enzymes and CT of the chest, abdomen, and pelvis were unremarkable.  His chest pain was atypical and occurred when he was trying to lie down in bed at night.  He was referred for coronary CT-A 04/2017 that revealed a coronary calcium score 361 which is 78th percentile.  He had extensive calcified plaque in the proximal LAD with possible moderate stenosis.  FFR was negative.  Repeat echo 08/2017 revealed LVEF 55 to 65% with grade 1 diastolic dysfunction.  Since his last appointment he was seen in the ED 06/2018 with chest pain.  Cardiac  enzymes were negative.  He underwent cardiac catheterization which revealed 50% stenosis in D1 and otherwise 10 to 20% stenoses in the distal RCA, proximal left circumflex, distal left main, and proximal LAD.  Arthur Moore was started on Vascepa for hypertriglyceridemia and CAD.  He is doing very well on it and had significant improvement in his lipids.  He tried to get it filled through the [place] and was denied.  Metoprolol succinate was also denied as were PCSK9 inhibitors.  He is very frustrated by this.  He is been working very hard on diet and exercise.  He is been able to lose over 30 pounds.  He is also been getting treatment for his anxiety.  He was started on lamotrigine and has noted a significant improvement in his mood.  He also takes lorazepam as needed for sleep.  He was having panic attacks but these have improved with this regimen.  He has no exertional chest pain or shortness of breath.  He denies lower extremity edema, orthopnea, or PND.   Lately Arthur Moore has been doing very well.  He continues to have a very healthy diet and is exercising every day.  His wife prepares all the meals at home and is very conscientious about her diet.  He only has red meat once per month.  He increased rosuvastatin to 10 mg on his own.  He is tolerating this without myalgias.  He occasionally gets a sharp left-sided chest pain that occurs at rest.  However he has been able to ride his bike up to 14 miles without any symptoms.  He has no lower extremity edema, orthopnea, or PND.  He does do physical therapy for pain in his neck and back.  His weight at home has ranged from 175 to 179 pounds.  Past Medical History:  Diagnosis Date  . Aortic insufficiency   . Arthritis   . Asthma   . Cervical disc disease   . Depression   . Diastolic dysfunction [id number]  . Hyperlipidemia 08/24/2015  . Hypertension   . Lumbar disc disease   . Psoriasis     Past Surgical History:  Procedure Laterality Date  .  CHOLECYSTECTOMY  2012  . HERNIA REPAIR    . LEFT HEART CATH AND CORONARY ANGIOGRAPHY N/A 07/08/2018   Procedure: LEFT HEART CATH AND CORONARY ANGIOGRAPHY;  Surgeon: Burnell Blanks, MD;  Location: [facility];  Service: Cardiovascular;  Laterality: N/A;  . PAROTIDECTOMY Right 1992  . PROSTATE SURGERY  2002, 2004  . SINOSCOPY    . SINUS EXPLORATION  2013  . SPINE SURGERY  2013   L C7-T1 laminectomy and discectomy  . TONSILLECTOMY       Current Outpatient Medications  Medication Sig Dispense Refill  . albuterol (PROVENTIL HFA;VENTOLIN HFA) 108 (90 Base) MCG/ACT inhaler Inhale 1 puff into the lungs as needed for wheezing or shortness of breath.    Marland Kitchen aspirin EC 81 MG tablet Take 81 mg by mouth daily.    . budesonide-formoterol (SYMBICORT) 160-4.5 MCG/ACT inhaler Inhale 2 puffs into the lungs 2 (two) times daily.    . cetirizine (ZYRTEC) 10 MG tablet Take 10 mg by mouth daily.     . Cholecalciferol (VITAMIN D3) 5000 units TABS Take 5,000 Units by mouth daily.     . clobetasol ointment (TEMOVATE) [id number] % Apply 1 application topically 2 (two) times daily. 120 g 0  . clobetasol ointment (TEMOVATE) [id number] % Apply 1 application topically 2 (two) times daily. 120 g 1  . Cranberry 1000 MG CAPS Take 1,000 mg by mouth daily.     . Glucosamine-Chondroit-Vit C-Mn (GLUCOSAMINE 1500 COMPLEX) CAPS Take 1 each by mouth daily.  0  . ibuprofen (ADVIL) 200 MG tablet Take 4 tablets (800 mg total) by mouth every 8 (eight) hours as needed.    . lamoTRIgine (LAMICTAL) 200 MG tablet Take 400 mg by mouth daily.     . Magnesium Oxide (MAG-OXIDE) 200 MG TABS Take 2.5 tablets (500 mg total) by mouth daily.  0  . metoprolol succinate (TOPROL XL) 25 MG 24 hr tablet Take 12.5 mg by mouth 2 (two) times daily.    . Misc Natural Products (OSTEO BI-FLEX ADV JOINT SHIELD PO) Take 1 tablet by mouth daily.    . Multiple Vitamin (MULTIVITAMIN) capsule Take 1 capsule by mouth daily.    . Omega-3 Fatty Acids (FISH OIL)  1000 MG CAPS Take 2,400 mg by mouth daily.     . pantoprazole (PROTONIX) 40 MG tablet Take 1 tablet (40 mg total) by mouth daily. 90 tablet 1  . Probiotic Product (PROBIOTIC DAILY PO) Take 1 tablet by mouth daily.    . rosuvastatin (CRESTOR) 5 MG tablet Take 1 tablet (5 mg total) by mouth daily. 90 tablet 3  . zinc gluconate 50 MG tablet Take 1 tablet (50 mg total) by mouth daily.     No current facility-administered medications for this visit.     Allergies:   Atorvastatin  Social History:  The patient  reports that he has never smoked. He has never used smokeless tobacco. He reports current alcohol use. He reports that he does not use drugs.   Family History:  The patient's family history includes Bipolar disorder in his brother; Cancer in an other family member; Colon cancer in his mother; Lung cancer in his father.    ROS:  Please see the history of present illness.   Otherwise, review of systems are positive for none.   All other systems are reviewed and negative.    PHYSICAL EXAM: VS:  BP 117/70   Pulse 60   Temp 98.2 F (36.8 C)   Ht 6' (1.829 m)   Wt 186 lb (84.4 kg)   SpO2 98%   BMI 25.23 kg/m  , BMI Body mass index is 25.23 kg/m. GENERAL:  Well appearing HEENT: Pupils equal round and reactive, fundi not visualized, oral mucosa unremarkable NECK:  No jugular venous distention, waveform within normal limits, carotid upstroke brisk and symmetric, no bruits LUNGS:  Clear to auscultation bilaterally HEART:  RRR.  PMI not displaced or sustained,S1 and S2 within normal limits, no S3, no S4, no clicks, no rubs, no murmurs ABD:  Flat, positive bowel sounds normal in frequency in pitch, no bruits, no rebound, no guarding, no midline pulsatile mass, no hepatomegaly, no splenomegaly EXT:  2 plus pulses throughout, no edema, no cyanosis no clubbing SKIN:  No rashes no nodules NEURO:  Cranial nerves II through XII grossly intact, motor grossly intact throughout PSYCH:   Cognitively intact, oriented to person place and time   EKG:  EKG is ordered today. 06/20/16: Sinus rhythm. Rate 64 bpm. 12/29/17: Sinus rhythm.  Rate 61 bpm. 03/16/2019: Sinus rhythm.  Rate 64 bpm.  Echo 04/25/14: LVEF 60%.  Aortic valve sclerosis without stenosis. Mild aortic insufficiency.  ETT 04/2013:  Exercise 8 minutes and 44 seconds on a Bruce protocol. Peak heart rate 136. 9.8 METS. No ischemic changes.  24 Hour Holter: Underlying rhythm is sinus. Average heart rate 74, minimum 52, max 123. Rare PACs and PVCs. No significant arrhythmia noted.  Echo 12/05/15: Study Conclusions  - Left ventricle: Global LV longitudinal strain is normal at -16.2%   There is a false tendon in the LV apex of no clinical   significance. The cavity size was normal. There was moderate   focal basal hypertrophy. Systolic function was normal. The   estimated ejection fraction was in the range of 55% to 60%. Wall   motion was normal; there were no regional wall motion   abnormalities. Features are consistent with a pseudonormal left   ventricular filling pattern, with concomitant abnormal relaxation   and increased filling pressure (grade 2 diastolic dysfunction). - Aortic valve: Trileaflet; mildly thickened leaflets. There was   mild to moderate regurgitation directed eccentrically in the LVOT   and towards the mitral anterior leaflet. - Mitral valve: There was mild regurgitation. - Pulmonic valve: There was mild regurgitation.  Echo 08/28/17: Study Conclusions  - Left ventricle: The cavity size was normal. There was mild   concentric hypertrophy. Systolic function was normal. The   estimated ejection fraction was in the range of 55% to 65%. Wall   motion was normal; there were no regional wall motion   abnormalities. Doppler parameters are consistent with abnormal   left ventricular relaxation (grade 1 diastolic dysfunction). - Aortic valve: There was mild to moderate regurgitation directed    eccentrically to the anterior leaflet of the  mitral valve. - Left atrium: The atrium was normal in size. - Right ventricle: Systolic function was normal. - Pulmonary arteries: Systolic pressure was within the normal   range.  Exercise Myoview 06/26/16: Nuclear stress EF: 49%. Asynchronous contraction.  The left ventricular ejection fraction is mildly decreased (45-54%).  There was no ST segment deviation noted during stress.  Defect 1: There is a small defect of mild severity present in the basal inferior location. Likely diaphragmatic attenuation artifact.  This is a low risk study. No ischemia identified  Coronary CT-A 05/15/17: IMPRESSION: 1. Coronary artery calcium score 361 Agatston units, placing the patient in the 78th percentile for age and gender, suggesting intermediate to high risk for future cardiac events.  2. Extensive calcified plaque in the proximal LAD, possible moderate stenosis though blooming artifact may make the disease look worse than it actually is.  FFR 0.83 in mid LAD. I do no think there is hemodynamically significant disease in the LAD. The LCx and RCA have no hemodynamically significant disease.  LHC 07/08/18:  Dist RCA lesion is 20% stenosed.  Prox Cx lesion is 10% stenosed.  Dist LM to Ost LAD lesion is 20% stenosed.  Ost 1st Diag lesion is 50% stenosed.  The left ventricular systolic function is normal.  LV end diastolic pressure is normal.  The left ventricular ejection fraction is 50-55% by visual estimate.  There is no mitral valve regurgitation.  Prox LAD lesion is 20% stenosed.   1. Mild non-obstructive CAD 2. Low normal LV systolic function   Echo [id number]: Study Conclusions  - Left ventricle: The cavity size was normal. There was mild   concentric hypertrophy. Systolic function was normal. The   estimated ejection fraction was in the range of 55% to 65%. Wall   motion was normal; there were no regional wall motion    abnormalities. Doppler parameters are consistent with abnormal   left ventricular relaxation (grade 1 diastolic dysfunction). - Aortic valve: There was mild to moderate regurgitation directed   eccentrically to the anterior leaflet of the mitral valve. - Left atrium: The atrium was normal in size. - Right ventricle: Systolic function was normal. - Pulmonary arteries: Systolic pressure was within the normal   range.   Recent Labs: 07/09/2018: Hemoglobin 13.6; Platelets 99 03/08/2019: ALT 34; BUN 22; Creatinine, Ser 1.29; Potassium 5.0; Sodium 143    Lipid Panel    Component Value Date/Time   CHOL 102 03/08/2019 1043   TRIG 94 03/08/2019 1043   HDL 34 (L) 03/08/2019 1043   CHOLHDL 3.0 03/08/2019 1043   CHOLHDL 4.1 07/09/2018 0407   VLDL 25 07/09/2018 0407   LDLCALC 50 03/08/2019 1043     11/2014: Total cholesterol 130, HDL 39, LDL 65, triglycerides 116 Total chol 133, tri 211, ldl 55, hdl 34  Wt Readings from Last 3 Encounters:  03/16/19 186 lb (84.4 kg)  11/18/18 176 lb 12.8 oz (80.2 kg)  07/27/18 175 lb (79.4 kg)      ASSESSMENT AND PLAN:  # Moderate, non-obstructive CAD:  # Hyperlipidemia:  Left heart cath revealed 50% ostial D1 disease and otherwise minimal, non-obstructive CAD.  Continue medical management with aspirin, metoprolol, and rosuvastatin.  LDL was 50 on 02/2019.   # Aortic regurgitation: Stable on echo 11/2015 and 08/2017.  Will repeat if needed based on symptoms.  He has no heart failure symptoms at this time.  # Diastolic dysfunction: Grade 2 on echo improved to grade 1 diastolic dysfunction on repeat.Marland Kitchen  No  symptoms of heart failure.  BP remains controlled.    # Hypertension: BP well-controlled. Continue metoprolol.   # TIA: Continue statin and aspirin 81 mg daily.  # Depression: # Anxiety:   Symptoms improving with new therapy.  His mood continues to be much better.  Continue exercise.   Current medicines are reviewed at length with the patient  today.  The patient does not have concerns regarding medicines.  The following changes have been made: none  Labs/ tests ordered today include:   No orders of the defined types were placed in this encounter.   Disposition:   FU with Azzan Butler C. Oval Linsey, MD in 6 months.    Time spent: 25 minutes-Greater than 50% of this time was spent in counseling, explanation of diagnosis, planning of further management, and coordination of care.   Signed, Skeet Latch, MD  03/16/2019 12:58 PM     [facility]

## 2019-03-17 DIAGNOSIS — M542 Cervicalgia: Secondary | ICD-10-CM | POA: Diagnosis not present

## 2019-03-17 DIAGNOSIS — M5416 Radiculopathy, lumbar region: Secondary | ICD-10-CM | POA: Diagnosis not present

## 2019-03-21 DIAGNOSIS — G8929 Other chronic pain: Secondary | ICD-10-CM | POA: Diagnosis not present

## 2019-03-21 DIAGNOSIS — M9904 Segmental and somatic dysfunction of sacral region: Secondary | ICD-10-CM | POA: Diagnosis not present

## 2019-03-21 DIAGNOSIS — M5416 Radiculopathy, lumbar region: Secondary | ICD-10-CM | POA: Diagnosis not present

## 2019-03-22 ENCOUNTER — Ambulatory Visit (INDEPENDENT_AMBULATORY_CARE_PROVIDER_SITE_OTHER): Payer: Medicare Other

## 2019-03-22 DIAGNOSIS — M533 Sacrococcygeal disorders, not elsewhere classified: Secondary | ICD-10-CM | POA: Diagnosis not present

## 2019-03-22 DIAGNOSIS — J309 Allergic rhinitis, unspecified: Secondary | ICD-10-CM

## 2019-03-28 DIAGNOSIS — N3942 Incontinence without sensory awareness: Secondary | ICD-10-CM | POA: Diagnosis not present

## 2019-03-28 DIAGNOSIS — R3912 Poor urinary stream: Secondary | ICD-10-CM | POA: Diagnosis not present

## 2019-03-28 DIAGNOSIS — R3915 Urgency of urination: Secondary | ICD-10-CM | POA: Diagnosis not present

## 2019-03-29 ENCOUNTER — Ambulatory Visit (INDEPENDENT_AMBULATORY_CARE_PROVIDER_SITE_OTHER): Payer: Medicare Other | Admitting: *Deleted

## 2019-03-29 DIAGNOSIS — J309 Allergic rhinitis, unspecified: Secondary | ICD-10-CM

## 2019-03-29 DIAGNOSIS — M542 Cervicalgia: Secondary | ICD-10-CM | POA: Diagnosis not present

## 2019-03-29 DIAGNOSIS — M5416 Radiculopathy, lumbar region: Secondary | ICD-10-CM | POA: Diagnosis not present

## 2019-03-31 DIAGNOSIS — M5416 Radiculopathy, lumbar region: Secondary | ICD-10-CM | POA: Diagnosis not present

## 2019-03-31 DIAGNOSIS — M542 Cervicalgia: Secondary | ICD-10-CM | POA: Diagnosis not present

## 2019-04-04 ENCOUNTER — Telehealth: Payer: Self-pay | Admitting: Cardiovascular Disease

## 2019-04-04 ENCOUNTER — Telehealth: Payer: Self-pay

## 2019-04-04 ENCOUNTER — Other Ambulatory Visit: Payer: Self-pay

## 2019-04-04 DIAGNOSIS — H40023 Open angle with borderline findings, high risk, bilateral: Secondary | ICD-10-CM | POA: Diagnosis not present

## 2019-04-04 DIAGNOSIS — H18413 Arcus senilis, bilateral: Secondary | ICD-10-CM | POA: Diagnosis not present

## 2019-04-04 DIAGNOSIS — H04123 Dry eye syndrome of bilateral lacrimal glands: Secondary | ICD-10-CM | POA: Diagnosis not present

## 2019-04-04 DIAGNOSIS — H43392 Other vitreous opacities, left eye: Secondary | ICD-10-CM | POA: Diagnosis not present

## 2019-04-04 DIAGNOSIS — H2513 Age-related nuclear cataract, bilateral: Secondary | ICD-10-CM | POA: Diagnosis not present

## 2019-04-04 MED ORDER — PANTOPRAZOLE SODIUM 40 MG PO TBEC: 40.0000 mg | DELAYED_RELEASE_TABLET | Freq: Every day | ORAL | 1 refills | Status: DC

## 2019-04-04 NOTE — Telephone Encounter (Signed)
New Message   *STAT* If patient is at the pharmacy, call can be transferred to refill team.   1. Which medications need to be refilled? (please list name of each medication and dose if known) metoprolol succinate (TOPROL XL) 25 MG 24 hr tablet  rosuvastatin (CRESTOR) 5 MG tablet  2. Which pharmacy/location (including street and city if local pharmacy) is medication to be sent to? Patient needs it to be handwritten so that he can take it to the [facility] hospital.   3. Do they need a 30 day or 90 day supply? 90 day with refills

## 2019-04-04 NOTE — Telephone Encounter (Signed)
Patient called to see if we can send pantoprazole as  270 tabs or 90 days refills  [place]    Please Advise

## 2019-04-04 NOTE — Telephone Encounter (Signed)
Protonix #90 with one refill sent to [facility]. Patient made aware.

## 2019-04-05 NOTE — Telephone Encounter (Signed)
Left message to call back  

## 2019-04-07 ENCOUNTER — Other Ambulatory Visit: Payer: Self-pay | Admitting: *Deleted

## 2019-04-07 MED ORDER — ROSUVASTATIN CALCIUM 5 MG PO TABS: 5.0000 mg | ORAL_TABLET | Freq: Every day | ORAL | 3 refills | Status: DC

## 2019-04-07 MED ORDER — METOPROLOL SUCCINATE ER 25 MG PO TB24: 12.5000 mg | ORAL_TABLET | Freq: Two times a day (BID) | ORAL | 3 refills | Status: DC

## 2019-04-07 NOTE — Telephone Encounter (Signed)
Follow Up ° °Patient is returning call. Please give patient a call back.  °

## 2019-04-08 NOTE — Telephone Encounter (Signed)
Left message will print out and have Dr Oval Linsey sign when she is in office next week.

## 2019-04-12 NOTE — Telephone Encounter (Signed)
Patient has chosen to get Rx's locally secondary to cost, they have been sent to [facility]

## 2019-04-13 ENCOUNTER — Telehealth: Payer: Self-pay | Admitting: Oncology

## 2019-04-13 NOTE — Telephone Encounter (Signed)
Arthur Moore 1/26 moved f/u from PM to AM. Confirmed with patient.

## 2019-04-18 ENCOUNTER — Other Ambulatory Visit (HOSPITAL_BASED_OUTPATIENT_CLINIC_OR_DEPARTMENT_OTHER): Payer: Self-pay

## 2019-04-18 DIAGNOSIS — R454 Irritability and anger: Secondary | ICD-10-CM

## 2019-04-18 DIAGNOSIS — R5383 Other fatigue: Secondary | ICD-10-CM

## 2019-04-18 DIAGNOSIS — G4710 Hypersomnia, unspecified: Secondary | ICD-10-CM

## 2019-04-18 DIAGNOSIS — G4730 Sleep apnea, unspecified: Secondary | ICD-10-CM

## 2019-04-18 DIAGNOSIS — R0683 Snoring: Secondary | ICD-10-CM

## 2019-04-27 ENCOUNTER — Other Ambulatory Visit: Payer: Self-pay

## 2019-04-27 ENCOUNTER — Telehealth: Payer: Self-pay | Admitting: *Deleted

## 2019-04-27 ENCOUNTER — Ambulatory Visit (INDEPENDENT_AMBULATORY_CARE_PROVIDER_SITE_OTHER): Payer: Medicare Other | Admitting: *Deleted

## 2019-04-27 DIAGNOSIS — J309 Allergic rhinitis, unspecified: Secondary | ICD-10-CM | POA: Diagnosis not present

## 2019-04-27 MED ORDER — ALBUTEROL SULFATE HFA 108 (90 BASE) MCG/ACT IN AERS: 1.0000 | INHALATION_SPRAY | RESPIRATORY_TRACT | 1 refills | Status: DC | PRN

## 2019-04-27 NOTE — Telephone Encounter (Signed)
Albuterol HFA #1 sent to [facility]. Patient made aware.

## 2019-04-27 NOTE — Telephone Encounter (Signed)
Patient needs prescription for Proventil called into [facility] on [address]

## 2019-04-28 ENCOUNTER — Other Ambulatory Visit: Payer: Self-pay

## 2019-04-28 MED ORDER — ALBUTEROL SULFATE HFA 108 (90 BASE) MCG/ACT IN AERS: 2.0000 | INHALATION_SPRAY | RESPIRATORY_TRACT | 1 refills | Status: DC | PRN

## 2019-04-29 ENCOUNTER — Ambulatory Visit (HOSPITAL_BASED_OUTPATIENT_CLINIC_OR_DEPARTMENT_OTHER): Payer: Medicare Other | Attending: Otolaryngology | Admitting: Internal Medicine

## 2019-04-29 DIAGNOSIS — G4710 Hypersomnia, unspecified: Secondary | ICD-10-CM | POA: Insufficient documentation

## 2019-04-29 DIAGNOSIS — R454 Irritability and anger: Secondary | ICD-10-CM | POA: Insufficient documentation

## 2019-04-29 DIAGNOSIS — R5383 Other fatigue: Secondary | ICD-10-CM | POA: Diagnosis not present

## 2019-04-29 DIAGNOSIS — G4730 Sleep apnea, unspecified: Secondary | ICD-10-CM | POA: Diagnosis not present

## 2019-04-29 DIAGNOSIS — G4733 Obstructive sleep apnea (adult) (pediatric): Secondary | ICD-10-CM

## 2019-04-29 DIAGNOSIS — R0683 Snoring: Secondary | ICD-10-CM | POA: Diagnosis not present

## 2019-04-30 ENCOUNTER — Encounter (HOSPITAL_COMMUNITY): Payer: Self-pay | Admitting: Emergency Medicine

## 2019-04-30 ENCOUNTER — Emergency Department (HOSPITAL_COMMUNITY): Payer: Medicare Other

## 2019-04-30 ENCOUNTER — Emergency Department (HOSPITAL_COMMUNITY)
Admission: EM | Admit: 2019-04-30 | Discharge: 2019-04-30 | Disposition: A | Discharge status: Home / Self Care | Payer: Medicare Other | Attending: Emergency Medicine | Admitting: Emergency Medicine

## 2019-04-30 ENCOUNTER — Other Ambulatory Visit: Payer: Self-pay

## 2019-04-30 DIAGNOSIS — Z7982 Long term (current) use of aspirin: Secondary | ICD-10-CM | POA: Insufficient documentation

## 2019-04-30 DIAGNOSIS — J45909 Unspecified asthma, uncomplicated: Secondary | ICD-10-CM | POA: Insufficient documentation

## 2019-04-30 DIAGNOSIS — Z20822 Contact with and (suspected) exposure to covid-19: Secondary | ICD-10-CM | POA: Insufficient documentation

## 2019-04-30 DIAGNOSIS — R079 Chest pain, unspecified: Secondary | ICD-10-CM | POA: Diagnosis not present

## 2019-04-30 DIAGNOSIS — I129 Hypertensive chronic kidney disease with stage 1 through stage 4 chronic kidney disease, or unspecified chronic kidney disease: Secondary | ICD-10-CM | POA: Diagnosis not present

## 2019-04-30 DIAGNOSIS — Z79899 Other long term (current) drug therapy: Secondary | ICD-10-CM | POA: Diagnosis not present

## 2019-04-30 DIAGNOSIS — N182 Chronic kidney disease, stage 2 (mild): Secondary | ICD-10-CM | POA: Insufficient documentation

## 2019-04-30 DIAGNOSIS — I2510 Atherosclerotic heart disease of native coronary artery without angina pectoris: Secondary | ICD-10-CM | POA: Diagnosis not present

## 2019-04-30 LAB — CBC
HCT: 45.7 % (ref 39.0–52.0)
Hemoglobin: 15.4 g/dL (ref 13.0–17.0)
MCH: 29.7 pg (ref 26.0–34.0)
MCHC: 33.7 g/dL (ref 30.0–36.0)
MCV: 88.1 fL (ref 80.0–100.0)
Platelets: 121 10*3/uL — ABNORMAL LOW (ref 150–400)
RBC: 5.19 MIL/uL (ref 4.22–5.81)
RDW: 13.5 % (ref 11.5–15.5)
WBC: 6.8 10*3/uL (ref 4.0–10.5)
nRBC: 0 % (ref 0.0–0.2)

## 2019-04-30 LAB — BASIC METABOLIC PANEL
Anion gap: 14 (ref 5–15)
BUN: 18 mg/dL (ref 8–23)
CO2: 23 mmol/L (ref 22–32)
Calcium: 9.3 mg/dL (ref 8.9–10.3)
Chloride: 103 mmol/L (ref 98–111)
Creatinine, Ser: 1.29 mg/dL — ABNORMAL HIGH (ref 0.61–1.24)
GFR calc Af Amer: >60 mL/min (ref >60)
GFR calc non Af Amer: 57 mL/min — ABNORMAL LOW (ref >60)
Glucose, Bld: 127 mg/dL — ABNORMAL HIGH (ref 70–99)
Potassium: 3.8 mmol/L (ref 3.5–5.1)
Sodium: 140 mmol/L (ref 135–145)

## 2019-04-30 LAB — LIPASE, BLOOD: Lipase: 32 U/L (ref 11–51)

## 2019-04-30 LAB — HEPATIC FUNCTION PANEL
ALT: 33 U/L (ref 0–44)
AST: 27 U/L (ref 15–41)
Albumin: 4.2 g/dL (ref 3.5–5.0)
Alkaline Phosphatase: 58 U/L (ref 38–126)
Bilirubin, Direct: 0.1 mg/dL (ref 0.0–0.2)
Indirect Bilirubin: 1 mg/dL — ABNORMAL HIGH (ref 0.3–0.9)
Total Bilirubin: 1.1 mg/dL (ref 0.3–1.2)
Total Protein: 6.4 g/dL — ABNORMAL LOW (ref 6.5–8.1)

## 2019-04-30 LAB — TROPONIN I (HIGH SENSITIVITY)
Troponin I (High Sensitivity): 5 ng/L (ref <18)
Troponin I (High Sensitivity): 4 ng/L (ref <18)

## 2019-04-30 LAB — PROTIME-INR
INR: 1 (ref 0.8–1.2)
Prothrombin Time: 13.4 seconds (ref 11.4–15.2)

## 2019-04-30 MED ORDER — SODIUM CHLORIDE 0.9% FLUSH
3.0000 mL | Freq: Once | INTRAVENOUS | Status: AC
  Administered 2019-04-30: 3 mL via INTRAVENOUS

## 2019-04-30 NOTE — ED Triage Notes (Signed)
Patient reports intermittent left chest pain onset last night with SOB and occasional productive cough , denies emesis or diaphoresis , he took ASA 324 mg prior to arrival with no relief.

## 2019-04-30 NOTE — Discharge Instructions (Addendum)
Talk to your cardiologist about increasing metoprolol as we discussed.  May need other medication optimization at that visit.  Call them for close follow-up appointment.

## 2019-04-30 NOTE — ED Notes (Signed)
EDP [place] at bedside.

## 2019-04-30 NOTE — ED Provider Notes (Signed)
[facility] EMERGENCY DEPARTMENT Provider Note   CSN: [id number] Arrival date & time: 04/30/19  0550     History Chief Complaint  Patient presents with  . Chest Pain    Arthur Moore is a 69 y.o. male.  The history is provided by the patient.  Chest Pain Pain location:  L chest Pain quality: aching   Pain radiates to:  Does not radiate Pain severity:  Moderate Onset quality:  Gradual Timing:  Intermittent Progression:  Waxing and waning Chronicity:  New Context: at rest   Relieved by:  Nothing Worsened by:  Nothing Associated symptoms: cough (dry)   Associated symptoms: no abdominal pain, no back pain, no fever, no headache, no heartburn, no nausea, no numbness, no orthopnea, no palpitations, no shortness of breath, no vomiting and no weakness   Risk factors: coronary artery disease, high cholesterol and hypertension        Past Medical History:  Diagnosis Date  . Aortic insufficiency   . Arthritis   . Asthma   . Cervical disc disease   . Depression   . Diastolic dysfunction [id number]  . Hyperlipidemia 08/24/2015  . Hypertension   . Lumbar disc disease   . Psoriasis     Patient Active Problem List   Diagnosis Date Noted  . CAD (coronary artery disease) 07/09/2018  . 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 Chest pain of uncertain etiology [id number]  . Chronic cough 06/21/2018  . Thrombocytopenia ([place]) 12/01/2016  . Transaminitis 12/01/2016  . Diastolic dysfunction 0000000  . MDD (major depressive disorder), recurrent severe, without psychosis ([place]) 10/13/2015  . Hyperlipidemia 08/24/2015  . Arthritis   . Depression   . Hypertension   . Aortic insufficiency   . Psoriasis   . Cervical disc disease   . Lumbar disc disease   . Asthma 01/06/2015  . Psoriatic arthritis ([place]) 01/06/2015  . Allergic rhinoconjunctivitis 01/06/2015  . GERD (gastroesophageal reflux disease) 01/06/2015  . Laryngopharyngeal reflux (LPR)  01/06/2015    Past Surgical History:  Procedure Laterality Date  . CHOLECYSTECTOMY  2012  . HERNIA REPAIR    . LEFT HEART CATH AND CORONARY ANGIOGRAPHY N/A 07/08/2018   Procedure: LEFT HEART CATH AND CORONARY ANGIOGRAPHY;  Surgeon: Burnell Blanks, MD;  Location: [facility];  Service: Cardiovascular;  Laterality: N/A;  . PAROTIDECTOMY Right 1992  . PROSTATE SURGERY  2002, 2004  . SINOSCOPY    . SINUS EXPLORATION  2013  . SPINE SURGERY  2013   L C7-T1 laminectomy and discectomy  . TONSILLECTOMY         Family History  Problem Relation Age of Onset  . Colon cancer Mother   . Lung cancer Father   . Cancer Other   . Bipolar disorder Brother   . Allergic rhinitis Neg Hx   . Angioedema Neg Hx   . Asthma Neg Hx   . Eczema Neg Hx     Social History   Tobacco Use  . Smoking status: Never Smoker  . Smokeless tobacco: Never Used  Substance Use Topics  . Alcohol use: Yes    Alcohol/week: 0.0 standard drinks    Comment: occasional beer  . Drug use: No    Home Medications Prior to Admission medications   Medication Sig Start Date End Date Taking? Authorizing Provider  albuterol (PROVENTIL HFA) 108 (90 Base) MCG/ACT inhaler Inhale 2 puffs into the lungs every 4 (four) hours as needed for wheezing or shortness of breath.  04/28/19   Kennith Gain, MD  albuterol (VENTOLIN HFA) 108 (90 Base) MCG/ACT inhaler Inhale 1 puff into the lungs as needed for wheezing or shortness of breath. 04/27/19   Valentina Shaggy, MD  aspirin EC 81 MG tablet Take 81 mg by mouth daily.    [provider]  budesonide-formoterol (SYMBICORT) 160-4.5 MCG/ACT inhaler Inhale 2 puffs into the lungs 2 (two) times daily.    [provider]  cetirizine (ZYRTEC) 10 MG tablet Take 10 mg by mouth daily.     [provider]  Cholecalciferol (VITAMIN D3) 5000 units TABS Take 5,000 Units by mouth daily.     [provider]  clobetasol ointment  (TEMOVATE) [id number] % Apply 1 application topically 2 (two) times daily. 01/28/19   Kennith Gain, MD  clobetasol ointment (TEMOVATE) AB-123456789 % Apply 1 application topically 2 (two) times daily. 02/22/19   Kennith Gain, MD  Cranberry 1000 MG CAPS Take 1,000 mg by mouth daily.     [provider]  Glucosamine-Chondroit-Vit C-Mn (GLUCOSAMINE 1500 COMPLEX) CAPS Take 1 each by mouth daily. 07/26/18   Lendon Colonel, NP  ibuprofen (ADVIL) 200 MG tablet Take 4 tablets (800 mg total) by mouth every 8 (eight) hours as needed. 11/08/18   Fatima Blank, MD  lamoTRIgine (LAMICTAL) 200 MG tablet Take 400 mg by mouth daily.     [provider]  Magnesium Oxide (MAG-OXIDE) 200 MG TABS Take 2.5 tablets (500 mg total) by mouth daily. 07/26/18   Lendon Colonel, NP  metoprolol succinate (TOPROL XL) 25 MG 24 hr tablet Take 0.5 tablets (12.5 mg total) by mouth 2 (two) times daily. 04/07/19   Skeet Latch, MD  Misc Natural Products (OSTEO BI-FLEX ADV JOINT SHIELD PO) Take 1 tablet by mouth daily.    [provider]  Multiple Vitamin (MULTIVITAMIN) capsule Take 1 capsule by mouth daily.    [provider]  Omega-3 Fatty Acids (FISH OIL) 1000 MG CAPS Take 2,400 mg by mouth daily.     [provider]  pantoprazole (PROTONIX) 40 MG tablet Take 1 tablet (40 mg total) by mouth daily. 04/04/19   Kennith Gain, MD  Probiotic Product (PROBIOTIC DAILY PO) Take 1 tablet by mouth daily.    [provider]  rosuvastatin (CRESTOR) 5 MG tablet Take 1 tablet (5 mg total) by mouth daily. 04/07/19 07/06/19  Skeet Latch, MD  zinc gluconate 50 MG tablet Take 1 tablet (50 mg total) by mouth daily. 07/26/18   Lendon Colonel, NP    Allergies    Atorvastatin  Review of Systems   Review of Systems  Constitutional: Negative for chills and fever.  HENT: Negative for ear pain and sore throat.   Eyes: Negative for pain and visual  disturbance.  Respiratory: Positive for cough (dry). Negative for shortness of breath.   Cardiovascular: Positive for chest pain. Negative for palpitations and orthopnea.  Gastrointestinal: Negative for abdominal pain, heartburn, nausea and vomiting.  Genitourinary: Negative for dysuria and hematuria.  Musculoskeletal: Negative for arthralgias and back pain.  Skin: Negative for color change and rash.  Neurological: Negative for seizures, syncope, weakness, numbness and headaches.  All other systems reviewed and are negative.   Physical Exam Updated Vital Signs  ED Triage Vitals  Enc Vitals Group     BP 04/30/19 0604 133/63     Pulse Rate 04/30/19 0604 67     Resp 04/30/19 0604 16     Temp  04/30/19 0604 98.2 F (36.8 C)     Temp Source 04/30/19 0604 Oral     SpO2 04/30/19 0604 97 %     Weight 04/30/19 0605 198 lb 6.6 oz (90 kg)     Height 04/30/19 0605 5\' 11"  (1.803 m)     Head Circumference --      Peak Flow --      Pain Score 04/30/19 0605 8     Pain Loc --      Pain Edu? --      Excl. in [place]? --     Physical Exam Vitals and nursing note reviewed.  Constitutional:      General: He is not in acute distress.    Appearance: He is well-developed. He is not ill-appearing.  HENT:     Head: Normocephalic and atraumatic.  Eyes:     Conjunctiva/sclera: Conjunctivae normal.     Pupils: Pupils are equal, round, and reactive to light.  Cardiovascular:     Rate and Rhythm: Normal rate and regular rhythm.     Pulses:          Radial pulses are 2+ on the right side and 2+ on the left side.     Heart sounds: Normal heart sounds. No murmur.  Pulmonary:     Effort: Pulmonary effort is normal. No respiratory distress.     Breath sounds: Normal breath sounds. No decreased breath sounds or wheezing.  Abdominal:     Palpations: Abdomen is soft.     Tenderness: There is no abdominal tenderness.  Musculoskeletal:        General: Normal range of motion.     Cervical back: Normal range  of motion and neck supple.     Right lower leg: No edema.     Left lower leg: No edema.  Skin:    General: Skin is warm and dry.     Capillary Refill: Capillary refill takes less than 2 seconds.  Neurological:     General: No focal deficit present.     Mental Status: He is alert.     ED Results / Procedures / Treatments   Labs (all labs ordered are listed, but only abnormal results are displayed) Labs Reviewed  BASIC METABOLIC PANEL - Abnormal; Notable for the following components:      Result Value   Glucose, Bld 127 (*)    Creatinine, Ser 1.29 (*)    GFR calc non Af Amer 57 (*)    All other components within normal limits  CBC - Abnormal; Notable for the following components:   Platelets 121 (*)    All other components within normal limits  HEPATIC FUNCTION PANEL - Abnormal; Notable for the following components:   Total Protein 6.4 (*)    Indirect Bilirubin 1.0 (*)    All other components within normal limits  NOVEL CORONAVIRUS, NAA (HOSP ORDER, SEND-OUT TO REF LAB; TAT 18-24 HRS)  PROTIME-INR  LIPASE, BLOOD  TROPONIN I (HIGH SENSITIVITY)  TROPONIN I (HIGH SENSITIVITY)    EKG EKG Interpretation  Date/Time:  Saturday April 30 2019 05:59:37 EST Ventricular Rate:  70 PR Interval:  184 QRS Duration: 102 QT Interval:  398 QTC Calculation: 429 R Axis:   66 Text Interpretation: Normal sinus rhythm Normal ECG Confirmed by Lennice Sites [phone]) on 04/30/2019 8:16:56 AM   Radiology DG Chest 2 View  Result Date: 04/30/2019 CLINICAL DATA:  Chest pain and shortness of breath. EXAM: CHEST - 2 VIEW COMPARISON:  07/08/2018, CT  05/15/2017 FINDINGS: The cardiomediastinal contours are normal. The lungs are clear. Pulmonary vasculature is normal. No consolidation, pleural effusion, or pneumothorax. No acute osseous abnormalities are seen. IMPRESSION: Unremarkable radiographs of the chest. Electronically Signed   By: Keith Rake M.D.   On: 04/30/2019 06:52     Procedures Procedures (including critical care time)  Medications Ordered in ED Medications  sodium chloride flush (NS) 0.9 % injection 3 mL (3 mLs Intravenous Given 04/30/19 [id number])    ED Course  I have reviewed the triage vital signs and the nursing notes.  Pertinent labs & imaging results that were available during my care of the patient were reviewed by me and considered in my medical decision making (see chart for details).    MDM Rules/Calculators/A&P  Dickie Rebelo is a 70 year old male with history of CAD, asthma who presents to the ED with chest pain.  Patient with normal vitals.  No fever.  Chest pain since last night.  No nausea, no vomiting.  Intermittent and waxing and waning.  Was unable to get comfortable sleeping.  EKG shows sinus rhythm.  No ischemic changes.  Troponin normal.  Chest x-ray with no significant findings.  No pneumonia, no pneumothorax.  Clear breath sounds on exams.  No PE risk factors and is Wells criteria 0.  Patient with no abdominal tenderness.  Had a heart cath last year that showed mild nonobstructive disease throughout but did have 50% stenosis of ostial first diagonal.  Took some aspirin and pain has improved.  Will discuss with cardiology about need for further work-up or outpatient follow-up.  Repeat troponin is unremarkable.  Lipase normal.  Hepatobiliary function unremarkable.  Talk to cardiology who will schedule patient for close outpatient follow-up.  Reassuring EKG and troponins.  May consider increasing his metoprolol but does have a heart rate in the low 60s upper 50s.  We will have him discuss further with his cardiologist about this possible change.  May benefit from nitrates as well.  Given return precautions and discharged from the ED in good condition.  Given that he has had a dry cough will test for coronavirus as well.   This chart was dictated using voice recognition software.  Despite best efforts to proofread,  errors can occur  which can change the documentation meaning.    Final Clinical Impression(s) / ED Diagnoses Final diagnoses:  Nonspecific chest pain    Rx / DC Orders ED Discharge Orders    None       Lennice Sites, DO 04/30/19 1042

## 2019-05-01 LAB — NOVEL CORONAVIRUS, NAA (HOSP ORDER, SEND-OUT TO REF LAB; TAT 18-24 HRS): SARS-CoV-2, NAA: NOT DETECTED

## 2019-05-02 ENCOUNTER — Other Ambulatory Visit: Payer: Self-pay

## 2019-05-04 ENCOUNTER — Other Ambulatory Visit (HOSPITAL_BASED_OUTPATIENT_CLINIC_OR_DEPARTMENT_OTHER): Payer: Self-pay

## 2019-05-04 DIAGNOSIS — G4710 Hypersomnia, unspecified: Secondary | ICD-10-CM

## 2019-05-04 DIAGNOSIS — R5383 Other fatigue: Secondary | ICD-10-CM

## 2019-05-04 DIAGNOSIS — R454 Irritability and anger: Secondary | ICD-10-CM

## 2019-05-04 DIAGNOSIS — G4730 Sleep apnea, unspecified: Secondary | ICD-10-CM

## 2019-05-04 DIAGNOSIS — R0683 Snoring: Secondary | ICD-10-CM

## 2019-05-06 DIAGNOSIS — R5383 Other fatigue: Secondary | ICD-10-CM | POA: Diagnosis not present

## 2019-05-06 DIAGNOSIS — G4710 Hypersomnia, unspecified: Secondary | ICD-10-CM

## 2019-05-06 DIAGNOSIS — R0683 Snoring: Secondary | ICD-10-CM | POA: Diagnosis not present

## 2019-05-06 DIAGNOSIS — R454 Irritability and anger: Secondary | ICD-10-CM

## 2019-05-06 NOTE — Procedures (Signed)
     Patient Name: Arthur Moore, Arthur Moore Date: 04/29/2019 Gender: Male D.O.B: [birth year] Age (years): 68 Referring Provider: Melida Quitter Height (inches): 75 Interpreting Physician: Baird Lyons MD, ABSM Weight (lbs): 175 RPSGT: Jacolyn Reedy BMI: 24 MRN: [id number] Neck Size: <br>  CLINICAL INFORMATION Sleep Study Type: HST Indication for sleep study: OSA Epworth Sleepiness Score: 20  SLEEP STUDY TECHNIQUE A multi-channel overnight portable sleep study was performed. The channels recorded were: nasal airflow, thoracic respiratory movement, and oxygen saturation with a pulse oximetry. Snoring was also monitored.  MEDICATIONS Patient self administered medications include: none reported.  SLEEP ARCHITECTURE Patient was studied for 375.5 minutes. The sleep efficiency was 100.0 % and the patient was supine for 0.2%. The arousal index was 0.0 per hour.  RESPIRATORY PARAMETERS The overall AHI was 13.9 per hour, with a central apnea index of 0.0 per hour. The oxygen nadir was 88% during sleep.  CARDIAC DATA Mean heart rate during sleep was 62.7 bpm.  IMPRESSIONS - Mild obstructive sleep apnea occurred during this study (AHI = 13.9/h). - No significant central sleep apnea occurred during this study (CAI = 0.0/h). - Oxygen desaturation was noted during this study (Min O2 = 88%). Mean O2 sat 94%. - Patient snored.  DIAGNOSIS - Obstructive Sleep Apnea (327.23 [G47.33 ICD-10])  RECOMMENDATIONS - Treatment options based on clinical judgment, including CPAP, a fitted oral appliance, or surgical intervention. - Be careful with alcohol, sedatives and other CNS depressants that may worsen sleep apnea and disrupt normal sleep architecture. - Sleep hygiene should be reviewed to assess factors that may improve sleep quality. - Weight management and regular exercise should be initiated or continued.  [Electronically signed] 05/06/2019 11:17 AM  Baird Lyons MD,  ABSM Diplomate, American Board of Sleep Medicine   NPI: [id number]                        [facility] [place] [facility]  ELECTRONICALLY SIGNED ON:  05/06/2019, 11:18 AM [place] PH: [phone]   FX: [phone] [place]

## 2019-05-12 NOTE — Progress Notes (Signed)
Cardiology Clinic Note   Patient Name: Arthur Moore Date of Encounter: 05/13/2019  Primary Care Provider:  Leighton Ruff, MD Primary Cardiologist:  Arthur Latch, MD  Patient Profile    Arthur Moore 69 year old male presents today for follow-up of his hypertension, aortic insufficiency, coronary artery disease, and hyperlipidemia.  Past Medical History    Past Medical History:  Diagnosis Date  . Aortic insufficiency   . Arthritis   . Asthma   . Cervical disc disease   . Depression   . Diastolic dysfunction [id number]  . Hyperlipidemia 08/24/2015  . Hypertension   . Lumbar disc disease   . Psoriasis    Past Surgical History:  Procedure Laterality Date  . CHOLECYSTECTOMY  2012  . HERNIA REPAIR    . LEFT HEART CATH AND CORONARY ANGIOGRAPHY N/A 07/08/2018   Procedure: LEFT HEART CATH AND CORONARY ANGIOGRAPHY;  Surgeon: Arthur Blanks, MD;  Location: [facility];  Service: Cardiovascular;  Laterality: N/A;  . PAROTIDECTOMY Right 1992  . PROSTATE SURGERY  2002, 2004  . SINOSCOPY    . SINUS EXPLORATION  2013  . SPINE SURGERY  2013   L C7-T1 laminectomy and discectomy  . TONSILLECTOMY      Allergies  Allergies  Allergen Reactions  . Atorvastatin Other (See Comments)    Calf pain.    History of Present Illness    Mr. Arthur Moore has a past medical history of coronary artery disease, asthma, mild aortic insufficiency, hypertension, TIA, OSA not on CPAP, liver fibrosis, anxiety, and depression.  He wore a cardiac monitor 06/2014 that showed rare PACs and PVCs.  He was previously seen by a cardiologist in [place] due to mild AR.  He subsequently had an echocardiogram at the [place] in 2015 that showed mild to moderate AR.  His echocardiogram 12/05/2015 showed an LVEF of 55-60% with grade 2 diastolic dysfunction, aortic regurgitation was again mild to moderate.  He then reported fatigue and atypical chest pain and was referred for a exercise Myoview 3/18  that showed LVEF 49% and asynchronous contractions.  There is a small defect in the basal inferior region that was thought to be diaphragmatic attenuation.  He was able to achieve 7 METS on Bruce protocol.  He then called the cardiology office after the results with persistent chest pain and it was a suggested that he follow-up with GI.  He was subsequently diagnosed with diverticulitis.  He was seen by Arthur Ferries, PA-C for chest pain.  He had been previously seen in the emergency department with chest pain as well.  His cardiac enzymes at that time and a CT of his chest abdomen pelvis were unremarkable.  His chest pain was atypical and occurred when he was trying to lie down in bed at night.  He was referred for a coronary CTA 04/2017 that revealed a coronary calcium score of 361 which is 78 percentile.  He had extensive calcified plaque in proximal LAD with possible moderate stenosis.  His FFR was negative.  A repeat echocardiogram 08/2017 revealed LVEF 55-65% with grade 1 diastolic dysfunction.  He again reported to the emergency department 06/2018 with chest pain.  His cardiac enzymes at that time were negative.  He underwent cardiac catheterization which revealed 50% stenosis in D1 and otherwise 10 to 20% stenosis in his distal RCA, proximal left circumflex, distal left main, and proximal left anterior descending.  He was started on Vascepa for hypertriglyceridemia and his CAD.  A significant improvement was noted  in his lipids with this therapy.  However, he tried to get these medications filled through the [place] and he was denied.  Metoprolol succinate was also denied as were PCSK9 inhibitors.  This was very frustrating to him.  At that time he was working very hard on his diet and exercise.  He had lost over 30 pounds.  He was also receiving treatment for his anxiety.  He was started on lamotrigine and noted a significant improvement in his mood.  He was also taking lorazepam as needed for sleep.  He noted  that he has been having panic attacks but these had improved with his medication regimen.  He denied exertional chest discomfort and shortness of breath.  He also denied PND, orthopnea, and lower extremity edema.  He was last seen by Arthur Moore on 02/2019.  During that time he was doing well.  He continued his healthy diet and daily exercise.  His wife was preparing all of his meals.  He was eating red meat only once per month.  He had increased his rosuvastatin to 10 mg on his own.  He was tolerating this well.  He had occasionally experience some left-sided chest pain at rest.  He also indicated that he was able to ride his bike for up to 14 miles without any symptoms.  He denied lower extremity edema, orthopnea, and PND.  His weight at that time was between 175-179 pounds.  On 04/30/2019 he presented to the emergency department with an ache in his left chest that was nonradiating, at rest, described as moderate, had no relieving or worsening components.  His EKG at that time was normal sinus rhythm 70 bpm and his troponins were unremarkable.  Chest x-ray 2 view 04/30/2019 unremarkable.  He presents today for follow-up and states he developed chest pain on 04/30/2019 at night and states that the pain would increase with inhalation.  He presented to the emergency department and his ischemic evaluation was negative.  He states that he continues to be active walking and riding his bicycle with his wife several times per week.  He has plans to purchase a stationary bike as well.  He has lost 15-20 pounds.  He continues to eat a Mediterranean style low-sodium diet.  He is very concerned about COVID-19 virus.  He wants to know if he is at increased risk, information about the vaccine, and details about the ventilatory process.  We had a lengthy discussion about risk modification with his coronary artery disease and prevention of COVID-19 contraction as well as vaccination.  I will have him follow-up with Arthur Moore  6 months and give him a prescription for sublingual nitroglycerin.  Today he denies chest pain, increased shortness of breath, lower extremity edema, fatigue, palpitations, melena, hematuria, hemoptysis, diaphoresis, weakness, presyncope, syncope, orthopnea, and PND.   Home Medications    Prior to Admission medications   Medication Sig Start Date End Date Taking? Authorizing Provider  albuterol (PROVENTIL HFA) 108 (90 Base) MCG/ACT inhaler Inhale 2 puffs into the lungs every 4 (four) hours as needed for wheezing or shortness of breath. 04/28/19   Kennith Gain, MD  albuterol (VENTOLIN HFA) 108 (90 Base) MCG/ACT inhaler Inhale 1 puff into the lungs as needed for wheezing or shortness of breath. 04/27/19   Valentina Shaggy, MD  aspirin EC 81 MG tablet Take 81 mg by mouth daily.    [provider]  budesonide-formoterol (SYMBICORT) 160-4.5 MCG/ACT inhaler Inhale 2 puffs into the lungs 2 (  two) times daily.    [provider]  cetirizine (ZYRTEC) 10 MG tablet Take 10 mg by mouth daily.     [provider]  Cholecalciferol (VITAMIN D3) 5000 units TABS Take 5,000 Units by mouth daily.     [provider]  clobetasol ointment (TEMOVATE) [id number] % Apply 1 application topically 2 (two) times daily. 01/28/19   Kennith Gain, MD  clobetasol ointment (TEMOVATE) [id number] % Apply 1 application topically 2 (two) times daily. 02/22/19   Kennith Gain, MD  Cranberry 1000 MG CAPS Take 1,000 mg by mouth daily.     [provider]  Glucosamine-Chondroit-Vit C-Mn (GLUCOSAMINE 1500 COMPLEX) CAPS Take 1 each by mouth daily. 07/26/18   Lendon Colonel, NP  ibuprofen (ADVIL) 200 MG tablet Take 4 tablets (800 mg total) by mouth every 8 (eight) hours as needed. 11/08/18   Fatima Blank, MD  lamoTRIgine (LAMICTAL) 200 MG tablet Take 400 mg by mouth daily.     [provider]  Magnesium Oxide (MAG-OXIDE) 200 MG TABS Take 2.5  tablets (500 mg total) by mouth daily. 07/26/18   Lendon Colonel, NP  metoprolol succinate (TOPROL XL) 25 MG 24 hr tablet Take 0.5 tablets (12.5 mg total) by mouth 2 (two) times daily. 04/07/19   Arthur Latch, MD  Misc Natural Products (OSTEO BI-FLEX ADV JOINT SHIELD PO) Take 1 tablet by mouth daily.    [provider]  Multiple Vitamin (MULTIVITAMIN) capsule Take 1 capsule by mouth daily.    [provider]  Omega-3 Fatty Acids (FISH OIL) 1000 MG CAPS Take 2,400 mg by mouth daily.     [provider]  pantoprazole (PROTONIX) 40 MG tablet Take 1 tablet (40 mg total) by mouth daily. 04/04/19   Kennith Gain, MD  Probiotic Product (PROBIOTIC DAILY PO) Take 1 tablet by mouth daily.    [provider]  rosuvastatin (CRESTOR) 5 MG tablet Take 1 tablet (5 mg total) by mouth daily. 04/07/19 07/06/19  Arthur Latch, MD  zinc gluconate 50 MG tablet Take 1 tablet (50 mg total) by mouth daily. 07/26/18   Lendon Colonel, NP    Family History    Family History  Problem Relation Age of Onset  . Colon cancer Mother   . Lung cancer Father   . Cancer Other   . Bipolar disorder Brother   . Allergic rhinitis Neg Hx   . Angioedema Neg Hx   . Asthma Neg Hx   . Eczema Neg Hx    He indicated that his mother is alive. He indicated that his father is deceased. He indicated that his sister is alive. He indicated that his brother is alive. He indicated that his maternal grandmother is deceased. He indicated that his maternal grandfather is deceased. He indicated that his paternal grandmother is deceased. He indicated that his paternal grandfather is deceased. He indicated that his daughter is alive. He indicated that his son is alive. He indicated that the status of his neg hx is unknown. He indicated that the status of his other is unknown.  Social History    Social History   Socioeconomic History  . Marital status: Married    Spouse name: Not on  file  . Number of children: Not on file  . Years of education: Not on file  . Highest education level: Not on file  Occupational History  . Not on file  Tobacco Use  . Smoking status: Never Smoker  .  Smokeless tobacco: Never Used  Substance and Sexual Activity  . Alcohol use: Yes    Alcohol/week: 0.0 standard drinks    Comment: occasional beer  . Drug use: No  . Sexual activity: Not on file  Other Topics Concern  . Not on file  Social History Narrative  . Not on file   Social Determinants of Health   Financial Resource Strain:   . Difficulty of Paying Living Expenses: Not on file  Food Insecurity:   . Worried About Charity fundraiser in the Last Year: Not on file  . Ran Out of Food in the Last Year: Not on file  Transportation Needs:   . Lack of Transportation (Medical): Not on file  . Lack of Transportation (Non-Medical): Not on file  Physical Activity:   . Days of Exercise per Week: Not on file  . Minutes of Exercise per Session: Not on file  Stress:   . Feeling of Stress : Not on file  Social Connections:   . Frequency of Communication with Friends and Family: Not on file  . Frequency of Social Gatherings with Friends and Family: Not on file  . Attends Religious Services: Not on file  . Active Member of Clubs or Organizations: Not on file  . Attends Archivist Meetings: Not on file  . Marital Status: Not on file  Intimate Partner Violence:   . Fear of Current or Ex-Partner: Not on file  . Emotionally Abused: Not on file  . Physically Abused: Not on file  . Sexually Abused: Not on file     Review of Systems    General:  No chills, fever, night sweats or weight changes.  Cardiovascular:  No chest pain, dyspnea on exertion, edema, orthopnea, palpitations, paroxysmal nocturnal dyspnea. Dermatological: No rash, lesions/masses Respiratory: No cough, dyspnea Urologic: No hematuria, dysuria Abdominal:   No nausea, vomiting, diarrhea, bright red blood per  rectum, melena, or hematemesis Neurologic:  No visual changes, wkns, changes in mental status. All other systems reviewed and are otherwise negative except as noted above.  Physical Exam    VS:  BP (!) 119/58 (BP Location: Left Arm, Patient Position: Sitting, Cuff Size: Normal)   Pulse 62   Ht 6' (1.829 m)   Wt 189 lb 9.6 oz (86 kg)   SpO2 96%   BMI 25.71 kg/m  , BMI Body mass index is 25.71 kg/m. GEN: Well nourished, well developed, in no acute distress. HEENT: normal. Neck: Supple, no JVD, carotid bruits, or masses. Cardiac: RRR, no murmurs, rubs, or gallops. No clubbing, cyanosis, edema.  Radials/DP/PT 2+ and equal bilaterally.  Respiratory:  Respirations regular and unlabored, clear to auscultation bilaterally. GI: Soft, nontender, nondistended, BS + x 4. MS: no deformity or atrophy. Skin: warm and dry, no rash. Neuro:  Strength and sensation are intact. Psych: Normal affect.  Accessory Clinical Findings    ECG personally reviewed by me today-none today  EKG1/05/2019 Normal sinus rhythm 70 bpm  Echocardiogram 08/28/2017 Study Conclusions  - Left ventricle: The cavity size was normal. There was mild   concentric hypertrophy. Systolic function was normal. The   estimated ejection fraction was in the range of 55% to 65%. Wall   motion was normal; there were no regional wall motion   abnormalities. Doppler parameters are consistent with abnormal   left ventricular relaxation (grade 1 diastolic dysfunction). - Aortic valve: There was mild to moderate regurgitation directed   eccentrically to the anterior leaflet of the mitral valve. -  Left atrium: The atrium was normal in size. - Right ventricle: Systolic function was normal. - Pulmonary arteries: Systolic pressure was within the normal   range.  Cardiac catheterization 07/08/2018  Dist RCA lesion is 20% stenosed.  Prox Cx lesion is 10% stenosed.  Dist LM to Ost LAD lesion is 20% stenosed.  Ost 1st Diag lesion is  50% stenosed.  The left ventricular systolic function is normal.  LV end diastolic pressure is normal.  The left ventricular ejection fraction is 50-55% by visual estimate.  There is no mitral valve regurgitation.  Prox LAD lesion is 20% stenosed.   1. Mild non-obstructive CAD 2. Low normal LV systolic function  Recommendations: Medical management of CAD  Diagnostic Dominance: Right  Intervention     Assessment & Plan   1.  Chest discomfort-no chest pain today.  Presentd to the emergency department 04/30/2019 cardiac enzymes were negative at 4-5.  EKG normal sinus rhythm 70 bpm.  Cardiac catheterization 07/08/2018 showed nonobstructive coronary artery disease.  Continue metoprolol succinate 12.5 mg daily Continue aspirin 81 mg tablet daily Continue Crestor 10 mg tablet daily Continue to monitor-no further testing needed at this time.  Nonobstructive coronary artery disease-LHC 07/08/2018 distal RCA 20% stenosed, proximal circumflex 10%, distal LAD 20%, first diagonal 50%, proximal LAD 20% LVEF 50-55% Continue aspirin 81 mg tablet daily Continue metoprolol succinate 12.5 mg twice daily Continue Crestor 10 mg daily Order nitroglycerin 0.4 mg sublingual as needed Heart healthy low-sodium diet Increase physical activity as tolerated  Essential hypertension-BP today 119/58 Continue metoprolol succinate 12.5 mg twice daily Heart healthy low-sodium diet-salty 6 given Increase physical activity as tolerated  Hyperlipidemia-LDL 66 10/05/2018 Continue rosuvastatin 10 mg tablet Continue heart healthy low-sodium high-fiber diet Increase physical activity as tolerated  COVID-19 virus and COVID-19 vaccination information discussed.  Disposition: Follow-up with Arthur Moore in 6 months.  Jossie Ng. [facility] [place] [address] Office [phone] Fax [phone]

## 2019-05-13 ENCOUNTER — Ambulatory Visit (INDEPENDENT_AMBULATORY_CARE_PROVIDER_SITE_OTHER): Payer: Medicare Other | Admitting: General Practice

## 2019-05-13 ENCOUNTER — Ambulatory Visit: Payer: Medicare Other | Admitting: Cardiovascular Disease

## 2019-05-13 ENCOUNTER — Encounter: Payer: Self-pay | Admitting: General Practice

## 2019-05-13 ENCOUNTER — Other Ambulatory Visit: Payer: Self-pay

## 2019-05-13 VITALS — BP 119/58 | HR 62 | Ht 72.0 in | Wt 189.6 lb

## 2019-05-13 DIAGNOSIS — E7800 Pure hypercholesterolemia, unspecified: Secondary | ICD-10-CM | POA: Diagnosis not present

## 2019-05-13 DIAGNOSIS — R0789 Other chest pain: Secondary | ICD-10-CM

## 2019-05-13 DIAGNOSIS — I10 Essential (primary) hypertension: Secondary | ICD-10-CM | POA: Diagnosis not present

## 2019-05-13 DIAGNOSIS — I2510 Atherosclerotic heart disease of native coronary artery without angina pectoris: Secondary | ICD-10-CM | POA: Diagnosis not present

## 2019-05-13 MED ORDER — NITROGLYCERIN 0.4 MG SL SUBL: 0.4000 mg | SUBLINGUAL_TABLET | SUBLINGUAL | 3 refills | Status: DC | PRN

## 2019-05-13 NOTE — Patient Instructions (Signed)
Follow-Up: IN 6 months Please call our office 2 months in advance, MAY 2021 to schedule this JUL 2021 appointment. In Person You may see Skeet Latch, MD or one of the following Advanced Practice Providers on your designated Care Team:  Coletta Memos, [place], PA-C [place], [place].    Special Instructions: PLEASE FOLLOW HEART HEALTHY, LOW SALT DIET-ATTACHED  Reduce your risk of getting COVID-19 With your heart disease it is especially important for people at increased risk of severe illness from COVID-19, and those who live with them, to protect themselves from getting COVID-19. The best way to protect yourself and to help reduce the spread of the virus that causes COVID-19 is to: Marland Kitchen Limit your interactions with other people as much as possible. . Take precautions to prevent getting COVID-19 when you do interact with others. If you start feeling sick and think you may have COVID-19, get in touch with your healthcare provider within 24  At [facility], you and your health needs are our priority.  As part of our continuing mission to provide you with exceptional heart care, we have created designated Provider Care Teams.  These Care Teams include your primary Cardiologist (physician) and Advanced Practice Providers (APPs -  Physician Assistants and Nurse Practitioners) who all work together to provide you with the care you need, when you need it.  Thank you for choosing [facility] at [facility]!!   HEART HEALTHY DIET Getting too much fat and cholesterol in your diet may cause health problems. Choosing the right foods helps keep your fat and cholesterol at normal levels. This can keep you from getting certain diseases. Your doctor may recommend an eating plan that includes:  Total fat: ______% or less of total calories a day.  Saturated fat: ______% or less of total calories a day.  Cholesterol: less than _________mg a day.  Fiber: ______g a day. What are tips for  following this plan? Meal planning  At meals, divide your plate into four equal parts: ? Fill one-half of your plate with vegetables and green salads. ? Fill one-fourth of your plate with whole grains. ? Fill one-fourth of your plate with low-fat (lean) protein foods.  Eat fish that is high in omega-3 fats at least two times a week. This includes mackerel, tuna, sardines, and salmon.  Eat foods that are high in fiber, such as whole grains, beans, apples, broccoli, carrots, peas, and barley. General tips   Work with your doctor to lose weight if you need to.  Avoid: ? Foods with added sugar. ? Fried foods. ? Foods with partially hydrogenated oils.  Limit alcohol intake to no more than 1 drink a day for nonpregnant women and 2 drinks a day for men. One drink equals 12 oz of beer, 5 oz of wine, or 1 oz of hard liquor. Reading food labels  Check food labels for: ? Trans fats. ? Partially hydrogenated oils. ? Saturated fat (g) in each serving. ? Cholesterol (mg) in each serving. ? Fiber (g) in each serving.  Choose foods with healthy fats, such as: ? Monounsaturated fats. ? Polyunsaturated fats. ? Omega-3 fats.  Choose grain products that have whole grains. Look for the word "whole" as the first word in the ingredient list. Cooking  Cook foods using low-fat methods. These include baking, boiling, grilling, and broiling.  Eat more home-cooked foods. Eat at restaurants and buffets less often.  Avoid cooking using saturated fats, such as butter, cream, palm oil, palm kernel oil,  and coconut oil. Recommended foods  Fruits  All fresh, canned (in natural juice), or frozen fruits. Vegetables  Fresh or frozen vegetables (raw, steamed, roasted, or grilled). Green salads. Grains  Whole grains, such as whole wheat or whole grain breads, crackers, cereals, and pasta. Unsweetened oatmeal, bulgur, barley, quinoa, or brown rice. Corn or whole wheat flour tortillas. Meats and other  protein foods  Ground beef (85% or leaner), grass-fed beef, or beef trimmed of fat. Skinless chicken or [place]. Ground chicken or [place]. Pork trimmed of fat. All fish and seafood. Egg whites. Dried beans, peas, or lentils. Unsalted nuts or seeds. Unsalted canned beans. Nut butters without added sugar or oil. Dairy  Low-fat or nonfat dairy products, such as skim or 1% milk, 2% or reduced-fat cheeses, low-fat and fat-free ricotta or cottage cheese, or plain low-fat and nonfat yogurt. Fats and oils  Tub margarine without trans fats. Light or reduced-fat mayonnaise and salad dressings. Avocado. Olive, canola, sesame, or safflower oils. The items listed above may not be a complete list of foods and beverages you can eat. Contact a dietitian for more information. Foods to avoid Fruits  Canned fruit in heavy syrup. Fruit in cream or butter sauce. Fried fruit. Vegetables  Vegetables cooked in cheese, cream, or butter sauce. Fried vegetables. Grains  White bread. White pasta. White rice. Cornbread. Bagels, pastries, and croissants. Crackers and snack foods that contain trans fat and hydrogenated oils. Meats and other protein foods  Fatty cuts of meat. Ribs, chicken wings, bacon, sausage, bologna, salami, chitterlings, fatback, hot dogs, bratwurst, and packaged lunch meats. Liver and organ meats. Whole eggs and egg yolks. Chicken and [place] with skin. Fried meat. Dairy  Whole or 2% milk, cream, half-and-half, and cream cheese. Whole milk cheeses. Whole-fat or sweetened yogurt. Full-fat cheeses. Nondairy creamers and whipped toppings. Processed cheese, cheese spreads, and cheese curds. Beverages  Alcohol. Sugar-sweetened drinks such as sodas, lemonade, and fruit drinks. Fats and oils  Butter, stick margarine, lard, shortening, ghee, or bacon fat. Coconut, palm kernel, and palm oils. Sweets and desserts  Corn syrup, sugars, honey, and molasses. Candy. Jam and jelly. Syrup. Sweetened cereals.  Cookies, pies, cakes, donuts, muffins, and ice cream. The items listed above may not be a complete list of foods and beverages you should avoid. Contact a dietitian for more information. Summary  Choosing the right foods helps keep your fat and cholesterol at normal levels. This can keep you from getting certain diseases.  At meals, fill one-half of your plate with vegetables and green salads.  Eat high-fiber foods, like whole grains, beans, apples, carrots, peas, and barley.  Limit added sugar, saturated fats, alcohol, and fried foods. This information is not intended to replace advice given to you by your health care provider. Make sure you discuss any questions you have with your health care provider. Document Revised: 12/16/2017 Document Reviewed: 12/30/2016 Elsevier Patient Education  [place].   Low-Sodium Eating Plan Sodium, which is an element that makes up salt, helps you maintain a healthy balance of fluids in your body. Too much sodium can increase your blood pressure and cause fluid and waste to be held in your body. Your health care provider or dietitian may recommend following this plan if you have high blood pressure (hypertension), kidney disease, liver disease, or heart failure. Eating less sodium can help lower your blood pressure, reduce swelling, and protect your heart, liver, and kidneys. What are tips for following this plan? General guidelines  Most people  on this plan should limit their sodium intake to 1,500-2,000 mg (milligrams) of sodium each day. Reading food labels   The Nutrition Facts label lists the amount of sodium in one serving of the food. If you eat more than one serving, you must multiply the listed amount of sodium by the number of servings.  Choose foods with less than 140 mg of sodium per serving.  Avoid foods with 300 mg of sodium or more per serving. Shopping  Look for lower-sodium products, often labeled as "low-sodium" or "no  salt added."  Always check the sodium content even if foods are labeled as "unsalted" or "no salt added".  Buy fresh foods. ? Avoid canned foods and premade or frozen meals. ? Avoid canned, cured, or processed meats  Buy breads that have less than 80 mg of sodium per slice. Cooking  Eat more home-cooked food and less restaurant, buffet, and fast food.  Avoid adding salt when cooking. Use salt-free seasonings or herbs instead of table salt or sea salt. Check with your health care provider or pharmacist before using salt substitutes.  Cook with plant-based oils, such as canola, sunflower, or olive oil. Meal planning  When eating at a restaurant, ask that your food be prepared with less salt or no salt, if possible.  Avoid foods that contain MSG (monosodium glutamate). MSG is sometimes added to [place] food, bouillon, and some canned foods. What foods are recommended? The items listed may not be a complete list. Talk with your dietitian about what dietary choices are best for you. Grains Low-sodium cereals, including oats, puffed wheat and rice, and shredded wheat. Low-sodium crackers. Unsalted rice. Unsalted pasta. Low-sodium bread. Whole-grain breads and whole-grain pasta. Vegetables Fresh or frozen vegetables. "No salt added" canned vegetables. "No salt added" tomato sauce and paste. Low-sodium or reduced-sodium tomato and vegetable juice. Fruits Fresh, frozen, or canned fruit. Fruit juice. Meats and other protein foods Fresh or frozen (no salt added) meat, poultry, seafood, and fish. Low-sodium canned tuna and salmon. Unsalted nuts. Dried peas, beans, and lentils without added salt. Unsalted canned beans. Eggs. Unsalted nut butters. Dairy Milk. Soy milk. Cheese that is naturally low in sodium, such as ricotta cheese, fresh mozzarella, or Swiss cheese Low-sodium or reduced-sodium cheese. Cream cheese. Yogurt. Fats and oils Unsalted butter. Unsalted margarine with no trans fat.  Vegetable oils such as canola or olive oils. Seasonings and other foods Fresh and dried herbs and spices. Salt-free seasonings. Low-sodium mustard and ketchup. Sodium-free salad dressing. Sodium-free light mayonnaise. Fresh or refrigerated horseradish. Lemon juice. Vinegar. Homemade, reduced-sodium, or low-sodium soups. Unsalted popcorn and pretzels. Low-salt or salt-free chips. What foods are not recommended? The items listed may not be a complete list. Talk with your dietitian about what dietary choices are best for you. Grains Instant hot cereals. Bread stuffing, pancake, and biscuit mixes. Croutons. Seasoned rice or pasta mixes. Noodle soup cups. Boxed or frozen macaroni and cheese. Regular salted crackers. Self-rising flour. Vegetables Sauerkraut, pickled vegetables, and relishes. Olives. [place] fries. Onion rings. Regular canned vegetables (not low-sodium or reduced-sodium). Regular canned tomato sauce and paste (not low-sodium or reduced-sodium). Regular tomato and vegetable juice (not low-sodium or reduced-sodium). Frozen vegetables in sauces. Meats and other protein foods Meat or fish that is salted, canned, smoked, spiced, or pickled. Bacon, ham, sausage, hotdogs, corned beef, chipped beef, packaged lunch meats, salt pork, jerky, pickled herring, anchovies, regular canned tuna, sardines, salted nuts. Dairy Processed cheese and cheese spreads. Cheese curds. Blue cheese. Feta cheese. String  cheese. Regular cottage cheese. Buttermilk. Canned milk. Fats and oils Salted butter. Regular margarine. Ghee. Bacon fat. Seasonings and other foods Onion salt, garlic salt, seasoned salt, table salt, and sea salt. Canned and packaged gravies. Worcestershire sauce. Tartar sauce. Barbecue sauce. Teriyaki sauce. Soy sauce, including reduced-sodium. Steak sauce. Fish sauce. Oyster sauce. Cocktail sauce. Horseradish that you find on the shelf. Regular ketchup and mustard. Meat flavorings and tenderizers.  Bouillon cubes. Hot sauce and Tabasco sauce. Premade or packaged marinades. Premade or packaged taco seasonings. Relishes. Regular salad dressings. Salsa. Potato and tortilla chips. Corn chips and puffs. Salted popcorn and pretzels. Canned or dried soups. Pizza. Frozen entrees and pot pies. Summary  Eating less sodium can help lower your blood pressure, reduce swelling, and protect your heart, liver, and kidneys.  Most people on this plan should limit their sodium intake to 1,500-2,000 mg (milligrams) of sodium each day.  Canned, boxed, and frozen foods are high in sodium. Restaurant foods, fast foods, and pizza are also very high in sodium. You also get sodium by adding salt to food.  Try to cook at home, eat more fresh fruits and vegetables, and eat less fast food, canned, processed, or prepared foods. This information is not intended to replace advice given to you by your health care provider. Make sure you discuss any questions you have with your health care provider. Document Revised: 03/27/2017 Document Reviewed: 04/07/2016 Elsevier Patient Education  2020 [employer].

## 2019-05-16 DIAGNOSIS — R7303 Prediabetes: Secondary | ICD-10-CM | POA: Diagnosis not present

## 2019-05-16 DIAGNOSIS — I2510 Atherosclerotic heart disease of native coronary artery without angina pectoris: Secondary | ICD-10-CM | POA: Diagnosis not present

## 2019-05-16 DIAGNOSIS — E559 Vitamin D deficiency, unspecified: Secondary | ICD-10-CM | POA: Diagnosis not present

## 2019-05-16 DIAGNOSIS — Z0000 Encounter for general adult medical examination without abnormal findings: Secondary | ICD-10-CM | POA: Diagnosis not present

## 2019-05-16 DIAGNOSIS — Z711 Person with feared health complaint in whom no diagnosis is made: Secondary | ICD-10-CM | POA: Diagnosis not present

## 2019-05-16 DIAGNOSIS — M519 Unspecified thoracic, thoracolumbar and lumbosacral intervertebral disc disorder: Secondary | ICD-10-CM | POA: Diagnosis not present

## 2019-05-16 DIAGNOSIS — M5090 Cervical disc disorder, unspecified, unspecified cervical region: Secondary | ICD-10-CM | POA: Diagnosis not present

## 2019-05-16 DIAGNOSIS — E7800 Pure hypercholesterolemia, unspecified: Secondary | ICD-10-CM | POA: Diagnosis not present

## 2019-05-16 DIAGNOSIS — L4050 Arthropathic psoriasis, unspecified: Secondary | ICD-10-CM | POA: Diagnosis not present

## 2019-05-16 DIAGNOSIS — I10 Essential (primary) hypertension: Secondary | ICD-10-CM | POA: Diagnosis not present

## 2019-05-18 DIAGNOSIS — M542 Cervicalgia: Secondary | ICD-10-CM | POA: Diagnosis not present

## 2019-05-20 ENCOUNTER — Telehealth: Payer: Self-pay | Admitting: Oncology

## 2019-05-20 NOTE — Telephone Encounter (Signed)
Returned patient's phone call regarding rescheduling 01/26 appointment, per patient's request appointments have been cancelled.

## 2019-05-23 ENCOUNTER — Telehealth: Payer: Self-pay

## 2019-05-23 DIAGNOSIS — M542 Cervicalgia: Secondary | ICD-10-CM | POA: Diagnosis not present

## 2019-05-23 DIAGNOSIS — M9904 Segmental and somatic dysfunction of sacral region: Secondary | ICD-10-CM | POA: Diagnosis not present

## 2019-05-23 NOTE — Telephone Encounter (Signed)
   [facility] Pre-operative Risk Assessment    Request for surgical clearance:  1. What type of surgery is being performed? LEFT SI FUSION   2. When is this surgery scheduled? TBD   3. What type of clearance is required (medical clearance vs. Pharmacy clearance to hold med vs. Both)? MEDICAL  4. Are there any medications that need to be held prior to surgery and how long? NONE    5. Practice name and name of physician performing surgery? [facility]   6. What is your office phone number [phone]    7.   What is your office fax number [phone]  8.   Anesthesia type (None, local, MAC, general) ? NOT LISTED

## 2019-05-24 ENCOUNTER — Other Ambulatory Visit: Payer: Self-pay

## 2019-05-24 ENCOUNTER — Encounter: Payer: Self-pay | Admitting: Allergy and Immunology

## 2019-05-24 ENCOUNTER — Inpatient Hospital Stay: Payer: Medicare Other

## 2019-05-24 ENCOUNTER — Inpatient Hospital Stay: Payer: Medicare Other | Admitting: Oncology

## 2019-05-24 ENCOUNTER — Ambulatory Visit (INDEPENDENT_AMBULATORY_CARE_PROVIDER_SITE_OTHER): Payer: Medicare Other | Admitting: Allergy and Immunology

## 2019-05-24 VITALS — BP 132/78 | HR 85 | Temp 98.2°F | Resp 18

## 2019-05-24 DIAGNOSIS — L308 Other specified dermatitis: Secondary | ICD-10-CM | POA: Diagnosis not present

## 2019-05-24 DIAGNOSIS — L989 Disorder of the skin and subcutaneous tissue, unspecified: Secondary | ICD-10-CM

## 2019-05-24 DIAGNOSIS — K219 Gastro-esophageal reflux disease without esophagitis: Secondary | ICD-10-CM

## 2019-05-24 DIAGNOSIS — I2510 Atherosclerotic heart disease of native coronary artery without angina pectoris: Secondary | ICD-10-CM

## 2019-05-24 DIAGNOSIS — J3089 Other allergic rhinitis: Secondary | ICD-10-CM | POA: Diagnosis not present

## 2019-05-24 DIAGNOSIS — J309 Allergic rhinitis, unspecified: Secondary | ICD-10-CM | POA: Diagnosis not present

## 2019-05-24 DIAGNOSIS — J4540 Moderate persistent asthma, uncomplicated: Secondary | ICD-10-CM | POA: Diagnosis not present

## 2019-05-24 NOTE — Patient Instructions (Addendum)
  1. Continue immunotherapy and EpiPen.    2. Continue Symbicort 160 - 2 inhalations 2 times a day  3. Continue nasal flunisolide 1-2 sprays each nostril one time per day during upper airway symptoms  4. Continue Protonix 40 mg 1 time per day  5. Continue the following if needed:   A. Zyrtec  B. Albuterol HFA  C. Clobetasol ointment  6. Obtain COVID vaccine  7. Return to clinic in 6 months or earlier if problem

## 2019-05-24 NOTE — Telephone Encounter (Signed)
   Primary Cardiologist: Skeet Latch, MD  Chart reviewed as part of pre-operative protocol coverage. Given past medical history and time since last visit, based on ACC/AHA guidelines, Arthur Moore would be at acceptable risk for the planned procedure without further cardiovascular testing. He was recently seen in our office on 05/13/2019 and doing well. He has history of cardiac cath in 06/2018 with no more than 50% coronary artery stenosis. He has normal EF with mild-moderate stable aortic regurgitation.   I will route this recommendation to the requesting party via Epic fax function and remove from pre-op pool.  Please call with questions.  Daune Perch, NP 05/24/2019, 10:50 AM

## 2019-05-24 NOTE — Progress Notes (Signed)
[place]   Follow-up Note  Referring Provider: Leighton Ruff, MD Primary Provider: Leighton Ruff, MD Date of Office Visit: 05/24/2019  Subjective:   Arthur Moore (DOB: [birth year]) is a 69 y.o. male who returns to the [facility] [place] on 05/24/2019 in re-evaluation of the following:  HPI: [name] returns to this clinic in reevaluation of asthma and allergic rhinoconjunctivitis and LPR.  I last saw him in this clinic 23 November 2018.  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  He has really been doing very well regarding his asthma.  He can exert himself without any problem and does not use the short acting bronchodilator and he has not required a systemic steroid to treat an exacerbation.  His nose has really been doing quite well.  He has not required an antibiotic to treat an episode of sinusitis.  He has been consistently using his Symbicort and nasal steroid.  His reflux has been under excellent control at this point in time.  He continues to use Protonix.  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  He still has some lingering itchiness on his right forearm for which he uses topical clobetasol about twice a week.  His immunotherapy is going quite well currently at every 2 weeks without adverse effect.  He did receive the flu vaccine this year.  He has some serious concerns about receiving the Covid vaccine.  He will be visiting with his rheumatologist as he has developed a fair amount of hand stiffness in the morning.  He apparently carries the diagnosis of psoriatic arthritis from years ago although that diagnosis was questioned by a different rheumatologist from the one who made that diagnosis.  Allergies as of 05/24/2019      Reactions   Atorvastatin Other (See Comments)   Calf pain.   Zolpidem Other (See Comments)      Medication List      albuterol  108 (90 Base) MCG/ACT inhaler Commonly known as: Proventil HFA Inhale 2 puffs into the lungs every 4 (four) hours as needed for wheezing or shortness of breath.   aspirin EC 81 MG tablet Take 81 mg by mouth daily.   budesonide-formoterol 160-4.5 MCG/ACT inhaler Commonly known as: SYMBICORT Inhale 2 puffs into the lungs 2 (two) times daily.   cetirizine 10 MG tablet Commonly known as: ZYRTEC Take 10 mg by mouth daily.   clobetasol ointment 0.05 % Commonly known as: TEMOVATE Apply 1 application topically 2 (two) times daily.   Cranberry 1000 MG Caps Take 1,000 mg by mouth daily.   Fish Oil 1000 MG Caps Take 2,400 mg by mouth daily.   Glucosamine 1500 Complex Caps Take 1 each by mouth daily.   ibuprofen 200 MG tablet Commonly known as: ADVIL Take 4 tablets (800 mg total) by mouth every 8 (eight) hours as needed.   lamoTRIgine 200 MG tablet Commonly known as: LAMICTAL Take 400 mg by mouth daily.   Magnesium Oxide 200 MG Tabs Commonly known as: Mag-Oxide Take 2.5 tablets (500 mg total) by mouth daily.   metoprolol succinate 25 MG 24 hr tablet Commonly known as: Toprol XL Take 0.5 tablets (12.5 mg total) by mouth 2 (two) times daily.   multivitamin capsule Take 1 capsule by mouth daily.   nitroGLYCERIN 0.4 MG SL tablet Commonly known as: NITROSTAT Place 1 tablet (0.4 mg total) under the tongue every 5 (five) minutes as  needed for chest pain.   OSTEO BI-FLEX ADV JOINT SHIELD PO Take 1 tablet by mouth daily.   pantoprazole 40 MG tablet Commonly known as: PROTONIX Take 1 tablet (40 mg total) by mouth daily.   PROBIOTIC DAILY PO Take 1 tablet by mouth daily.   rosuvastatin 5 MG tablet Commonly known as: CRESTOR Take 1 tablet (5 mg total) by mouth daily.   Vitamin D3 125 MCG (5000 UT) Tabs Take 5,000 Units by mouth daily.   zinc gluconate 50 MG tablet Take 1 tablet (50 mg total) by mouth daily.       Past Medical History:  Diagnosis Date  . Aortic  insufficiency   . Arthritis   . Asthma   . Cervical disc disease   . Depression   . Diastolic dysfunction [id number]  . Hyperlipidemia 08/24/2015  . Hypertension   . Lumbar disc disease   . Psoriasis     Past Surgical History:  Procedure Laterality Date  . CHOLECYSTECTOMY  2012  . HERNIA REPAIR    . LEFT HEART CATH AND CORONARY ANGIOGRAPHY N/A 07/08/2018   Procedure: LEFT HEART CATH AND CORONARY ANGIOGRAPHY;  Surgeon: Burnell Blanks, MD;  Location: [facility];  Service: Cardiovascular;  Laterality: N/A;  . PAROTIDECTOMY Right 1992  . PROSTATE SURGERY  2002, 2004  . SINOSCOPY    . SINUS EXPLORATION  2013  . SPINE SURGERY  2013   L C7-T1 laminectomy and discectomy  . TONSILLECTOMY      Review of systems negative except as noted in HPI / PMHx or noted below:  Review of Systems  Constitutional: Negative.   HENT: Negative.   Eyes: Negative.   Respiratory: Negative.   Cardiovascular: Negative.   Gastrointestinal: Negative.   Genitourinary: Negative.   Musculoskeletal: Negative.   Skin: Negative.   Neurological: Negative.   Endo/Heme/Allergies: Negative.   Psychiatric/Behavioral: Negative.      Objective:   Vitals:   05/24/19 1136  BP: 132/78  Pulse: 85  Resp: 18  Temp: 98.2 F (36.8 C)  SpO2: 97%          Physical Exam Constitutional:      Appearance: He is not diaphoretic.  HENT:     Head: Normocephalic.     Right Ear: Tympanic membrane, ear canal and external ear normal.     Left Ear: Tympanic membrane, ear canal and external ear normal.     Nose: Nose normal. No mucosal edema or rhinorrhea.     Mouth/Throat:     Pharynx: Uvula midline. No oropharyngeal exudate.  Eyes:     Conjunctiva/sclera: Conjunctivae normal.  Neck:     Thyroid: No thyromegaly.     Trachea: Trachea normal. No tracheal tenderness or tracheal deviation.  Cardiovascular:     Rate and Rhythm: Normal rate and regular rhythm.     Heart sounds: Normal heart sounds, S1  normal and S2 normal. No murmur.  Pulmonary:     Effort: No respiratory distress.     Breath sounds: Normal breath sounds. No stridor. No wheezing or rales.  Lymphadenopathy:     Head:     Right side of head: No tonsillar adenopathy.     Left side of head: No tonsillar adenopathy.     Cervical: No cervical adenopathy.  Skin:    Findings: Rash (Approximately 4 excoriated areas affecting right forearm without any significant induration or erythema.) present. No erythema.     Nails: There is no clubbing.  Neurological:  Mental Status: He is alert.     Diagnostics:    Spirometry was performed and demonstrated an FEV1 of 3.02 at 84 % of predicted.  Assessment and Plan:   1. Asthma, moderate persistent, well-controlled   2. Other allergic rhinitis   3. LPRD (laryngopharyngeal reflux disease)   4. Inflammatory dermatosis     1. Continue immunotherapy and EpiPen.    2. Continue Symbicort 160 - 2 inhalations 2 times a day  3. Continue nasal flunisolide 1-2 sprays each nostril one time per day during upper airway symptoms  4. Continue Protonix 40 mg 1 time per day  5. Continue the following if needed:   A. Zyrtec  B. Albuterol HFA  C. Clobetasol ointment  6. Obtain COVID vaccine  7. Return to clinic in 6 months or earlier if problem  Overall [name] has done relatively well regarding his airway issue.  I would like for him to continue to utilize a anti-inflammatory agent for both his upper and lower airway and continue on immunotherapy.  His reflux appears to be well maintained with the use of Protonix.  He can continue the intermittent use of topical clobetasol for his lingering inflammatory dermatosis.  Assuming he does well with this plan I will see him back in his clinic in 6 months or earlier if there is a problem.  I had a long talk with him today about the need for him to receive the Covid vaccination.  Allena Katz, MD Allergy / Immunology [place]

## 2019-05-25 ENCOUNTER — Encounter: Payer: Self-pay | Admitting: Allergy and Immunology

## 2019-05-26 ENCOUNTER — Telehealth: Payer: Self-pay | Admitting: Allergy and Immunology

## 2019-05-26 NOTE — Telephone Encounter (Signed)
Left message as long as he is symptom free and 48 hours covid vaccine can be given.

## 2019-05-26 NOTE — Telephone Encounter (Signed)
Pt came in Tuesday and got his inj and [facility] And wants to know if that is alright. Call [phone] and let a message yes or no.

## 2019-05-30 DIAGNOSIS — L409 Psoriasis, unspecified: Secondary | ICD-10-CM | POA: Diagnosis not present

## 2019-05-30 DIAGNOSIS — Z6825 Body mass index (BMI) 25.0-25.9, adult: Secondary | ICD-10-CM | POA: Diagnosis not present

## 2019-05-30 DIAGNOSIS — M2550 Pain in unspecified joint: Secondary | ICD-10-CM | POA: Diagnosis not present

## 2019-05-30 DIAGNOSIS — E663 Overweight: Secondary | ICD-10-CM | POA: Diagnosis not present

## 2019-05-30 DIAGNOSIS — M150 Primary generalized (osteo)arthritis: Secondary | ICD-10-CM | POA: Diagnosis not present

## 2019-05-30 DIAGNOSIS — M545 Low back pain: Secondary | ICD-10-CM | POA: Diagnosis not present

## 2019-05-30 DIAGNOSIS — L4050 Arthropathic psoriasis, unspecified: Secondary | ICD-10-CM | POA: Diagnosis not present

## 2019-06-01 ENCOUNTER — Other Ambulatory Visit (HOSPITAL_BASED_OUTPATIENT_CLINIC_OR_DEPARTMENT_OTHER): Payer: Self-pay

## 2019-06-01 DIAGNOSIS — R5383 Other fatigue: Secondary | ICD-10-CM

## 2019-06-01 DIAGNOSIS — R0683 Snoring: Secondary | ICD-10-CM

## 2019-06-01 DIAGNOSIS — G4710 Hypersomnia, unspecified: Secondary | ICD-10-CM

## 2019-06-06 DIAGNOSIS — H52223 Regular astigmatism, bilateral: Secondary | ICD-10-CM | POA: Diagnosis not present

## 2019-06-06 DIAGNOSIS — H18413 Arcus senilis, bilateral: Secondary | ICD-10-CM | POA: Diagnosis not present

## 2019-06-06 DIAGNOSIS — H2513 Age-related nuclear cataract, bilateral: Secondary | ICD-10-CM | POA: Diagnosis not present

## 2019-06-06 DIAGNOSIS — H5203 Hypermetropia, bilateral: Secondary | ICD-10-CM | POA: Diagnosis not present

## 2019-06-06 DIAGNOSIS — H40023 Open angle with borderline findings, high risk, bilateral: Secondary | ICD-10-CM | POA: Diagnosis not present

## 2019-06-06 DIAGNOSIS — H11153 Pinguecula, bilateral: Secondary | ICD-10-CM | POA: Diagnosis not present

## 2019-06-06 DIAGNOSIS — H04123 Dry eye syndrome of bilateral lacrimal glands: Secondary | ICD-10-CM | POA: Diagnosis not present

## 2019-06-14 ENCOUNTER — Other Ambulatory Visit (HOSPITAL_COMMUNITY)
Admission: RE | Admit: 2019-06-14 | Discharge: 2019-06-14 | Disposition: A | Discharge status: Home / Self Care | Payer: Medicare Other | Source: Ambulatory Visit | Attending: Internal Medicine | Admitting: Internal Medicine

## 2019-06-14 DIAGNOSIS — Z01812 Encounter for preprocedural laboratory examination: Secondary | ICD-10-CM | POA: Insufficient documentation

## 2019-06-14 DIAGNOSIS — Z20822 Contact with and (suspected) exposure to covid-19: Secondary | ICD-10-CM | POA: Insufficient documentation

## 2019-06-14 LAB — SARS CORONAVIRUS 2 (TAT 6-24 HRS): SARS Coronavirus 2: NEGATIVE

## 2019-06-16 ENCOUNTER — Encounter (HOSPITAL_BASED_OUTPATIENT_CLINIC_OR_DEPARTMENT_OTHER): Payer: Medicare Other | Admitting: Internal Medicine

## 2019-06-16 DIAGNOSIS — M542 Cervicalgia: Secondary | ICD-10-CM | POA: Diagnosis not present

## 2019-06-16 DIAGNOSIS — M545 Low back pain: Secondary | ICD-10-CM | POA: Diagnosis not present

## 2019-06-16 DIAGNOSIS — G894 Chronic pain syndrome: Secondary | ICD-10-CM | POA: Diagnosis not present

## 2019-06-18 ENCOUNTER — Ambulatory Visit (HOSPITAL_BASED_OUTPATIENT_CLINIC_OR_DEPARTMENT_OTHER): Payer: Medicare Other | Attending: Otolaryngology | Admitting: Internal Medicine

## 2019-06-18 VITALS — Ht 72.0 in | Wt 180.0 lb

## 2019-06-18 DIAGNOSIS — R0683 Snoring: Secondary | ICD-10-CM | POA: Insufficient documentation

## 2019-06-18 DIAGNOSIS — R5383 Other fatigue: Secondary | ICD-10-CM

## 2019-06-18 DIAGNOSIS — G4710 Hypersomnia, unspecified: Secondary | ICD-10-CM | POA: Insufficient documentation

## 2019-06-23 ENCOUNTER — Ambulatory Visit (INDEPENDENT_AMBULATORY_CARE_PROVIDER_SITE_OTHER): Payer: Medicare Other

## 2019-06-23 DIAGNOSIS — J4540 Moderate persistent asthma, uncomplicated: Secondary | ICD-10-CM | POA: Diagnosis not present

## 2019-06-23 DIAGNOSIS — I10 Essential (primary) hypertension: Secondary | ICD-10-CM | POA: Diagnosis not present

## 2019-06-23 DIAGNOSIS — N400 Enlarged prostate without lower urinary tract symptoms: Secondary | ICD-10-CM | POA: Diagnosis not present

## 2019-06-23 DIAGNOSIS — E7800 Pure hypercholesterolemia, unspecified: Secondary | ICD-10-CM | POA: Diagnosis not present

## 2019-06-23 DIAGNOSIS — I2510 Atherosclerotic heart disease of native coronary artery without angina pectoris: Secondary | ICD-10-CM | POA: Diagnosis not present

## 2019-06-25 DIAGNOSIS — R0683 Snoring: Secondary | ICD-10-CM | POA: Diagnosis not present

## 2019-06-25 NOTE — Procedures (Signed)
Patient Name: Arthur Moore, Arthur Moore Date: 06/18/2019 Gender: Male D.O.B: [birth year] Age (years): 69 Referring Provider: Melida Quitter Height (inches): 52 Interpreting Physician: Baird Lyons MD, ABSM Weight (lbs): 180 RPSGT: [place], [place] BMI: 24 MRN: [id number] Neck Size: 16.00  CLINICAL INFORMATION Sleep Study Type: NPSG Indication for sleep study: Fatigue, Snoring Epworth Sleepiness Score: 14  Most recent polysomnogram dated 04/29/2019 revealed an AHI of 13/h and RDI of 13/h.  SLEEP STUDY TECHNIQUE As per the AASM Manual for the Scoring of Sleep and Associated Events v2.3 (April 2016) with a hypopnea requiring 4% desaturations.  The channels recorded and monitored were frontal, central and occipital EEG, electrooculogram (EOG), submentalis EMG (chin), nasal and oral airflow, thoracic and abdominal wall motion, anterior tibialis EMG, snore microphone, electrocardiogram, and pulse oximetry.  MEDICATIONS Medications self-administered by patient taken the night of the study : Lorazepam  SLEEP ARCHITECTURE The study was initiated at 10:50:54 PM and ended at 5:03:32 AM.  Sleep onset time was 0.0 minutes and the sleep efficiency was 84.7%%. The total sleep time was 315.6 minutes.  Stage REM latency was 61.0 minutes.  The patient spent 15.4%% of the night in stage N1 sleep, 56.9%% in stage N2 sleep, 8.9%% in stage N3 and 18.9% in REM.  Alpha intrusion was absent.  Supine sleep was 66.47%.  RESPIRATORY PARAMETERS The overall apnea/hypopnea index (AHI) was 1.3 per hour. There were 1 total apneas, including 1 obstructive, 0 central and 0 mixed apneas. There were 6 hypopneas and 26 RERAs.  The AHI during Stage REM sleep was 5.0 per hour.  AHI while supine was 2.0 per hour.  The mean oxygen saturation was 91.6%. The minimum SpO2 during sleep was 87.0%.  moderate snoring was noted during this study.  CARDIAC DATA The 2 lead EKG demonstrated sinus rhythm. The mean  heart rate was 55.6 beats per minute. Other EKG findings include: None.  LEG MOVEMENT DATA The total PLMS were 0 with a resulting PLMS index of 0.0. Associated arousal with leg movement index was 0.0 .  IMPRESSIONS - Occasional obstructive sleep apneas occurred during this study, within normal limits (AHI = 1.3/h). - There were 26 respiratory events related to arousals (RERAS), that were too short or otherwise did not meet criterria to be scored as apneas or hypopneas. - No significant central sleep apnea occurred during this study (CAI = 0.0/h). - Mild oxygen desaturation was noted during this study (Min O2 = 87.0%). Mean O2 sat 91.7%. - Time with O2 saturation 88% or less was 0.8 minutes. - The patient snored with moderate snoring volume. - No cardiac abnormalities were noted during this study. - Clinically significant periodic limb movements did not occur during sleep. No significant associated arousals.  DIAGNOSIS - Primary snoring  RECOMMENDATIONS - Manage for symptoms based on clinical judgment. - Be careful with alcohol, sedatives and other CNS depressants that may worsen sleep apnea and disrupt normal sleep architecture. - Sleep hygiene should be reviewed to assess factors that may improve sleep quality. - Weight management and regular exercise should be initiated or continued if appropriate.  [Electronically signed] 06/25/2019 11:56 AM  Baird Lyons MD, ABSM Diplomate, American Board of Sleep Medicine   NPI: [id number]                          [place] [place] [facility]  ELECTRONICALLY SIGNED ON:  06/25/2019, 11:41 AM [place]: [phone]  FX: [phone] [place]

## 2019-07-07 DIAGNOSIS — R519 Headache, unspecified: Secondary | ICD-10-CM | POA: Diagnosis not present

## 2019-07-07 DIAGNOSIS — F39 Unspecified mood [affective] disorder: Secondary | ICD-10-CM | POA: Diagnosis not present

## 2019-07-08 DIAGNOSIS — Z20828 Contact with and (suspected) exposure to other viral communicable diseases: Secondary | ICD-10-CM | POA: Diagnosis not present

## 2019-07-08 DIAGNOSIS — F339 Major depressive disorder, recurrent, unspecified: Secondary | ICD-10-CM | POA: Diagnosis not present

## 2019-07-14 DIAGNOSIS — M545 Low back pain: Secondary | ICD-10-CM | POA: Diagnosis not present

## 2019-07-15 DIAGNOSIS — E7800 Pure hypercholesterolemia, unspecified: Secondary | ICD-10-CM | POA: Diagnosis not present

## 2019-07-15 DIAGNOSIS — I10 Essential (primary) hypertension: Secondary | ICD-10-CM | POA: Diagnosis not present

## 2019-07-15 DIAGNOSIS — I2510 Atherosclerotic heart disease of native coronary artery without angina pectoris: Secondary | ICD-10-CM | POA: Diagnosis not present

## 2019-07-15 DIAGNOSIS — N400 Enlarged prostate without lower urinary tract symptoms: Secondary | ICD-10-CM | POA: Diagnosis not present

## 2019-07-15 DIAGNOSIS — G4700 Insomnia, unspecified: Secondary | ICD-10-CM | POA: Diagnosis not present

## 2019-07-27 DIAGNOSIS — N401 Enlarged prostate with lower urinary tract symptoms: Secondary | ICD-10-CM | POA: Diagnosis not present

## 2019-07-27 DIAGNOSIS — R350 Frequency of micturition: Secondary | ICD-10-CM | POA: Diagnosis not present

## 2019-07-27 DIAGNOSIS — R3912 Poor urinary stream: Secondary | ICD-10-CM | POA: Diagnosis not present

## 2019-08-03 ENCOUNTER — Ambulatory Visit (INDEPENDENT_AMBULATORY_CARE_PROVIDER_SITE_OTHER): Payer: Medicare Other

## 2019-08-03 DIAGNOSIS — J309 Allergic rhinitis, unspecified: Secondary | ICD-10-CM

## 2019-08-04 ENCOUNTER — Telehealth: Payer: Self-pay | Admitting: Cardiovascular Disease

## 2019-08-04 NOTE — Telephone Encounter (Signed)
New Message     Pt c/o medication issue:  1. Name of Medication: metoprolol succinate (TOPROL XL) 25 MG 24 hr tablet  2. How are you currently taking this medication (dosage and times per day)? Pt says he takes the regular metoprolol 2 x daily   3. Are you having a reaction (difficulty breathing--STAT)? No   4. What is your medication issue? Pt is calling and says he was prescribed the regular metoprolol 2 x daily and the medication that was sent to the pharmacy is for the extended release  He says he is suppose to take the regular one     Please call

## 2019-08-04 NOTE — Telephone Encounter (Signed)
Will send message to Dr. Oval Linsey for clarification. Should patient be on metoprolol succinate 12.5 mg BID or metoprolol tartrate 12.5 mg BID?

## 2019-08-05 NOTE — Telephone Encounter (Signed)
Reviewed chart Looks like patient was taking Metoprolol succ in 12/2017 and Dr Oval Linsey changed to Metoprolol tart but was removed from patients list with reason patient not taking.  Left message to call back

## 2019-08-05 NOTE — Telephone Encounter (Signed)
He takes metoprolol succinate 12.5 mg bid

## 2019-08-08 ENCOUNTER — Other Ambulatory Visit: Payer: Self-pay | Admitting: Gastroenterology

## 2019-08-08 DIAGNOSIS — R7303 Prediabetes: Secondary | ICD-10-CM | POA: Diagnosis not present

## 2019-08-08 DIAGNOSIS — K760 Fatty (change of) liver, not elsewhere classified: Secondary | ICD-10-CM

## 2019-08-08 DIAGNOSIS — E7800 Pure hypercholesterolemia, unspecified: Secondary | ICD-10-CM | POA: Diagnosis not present

## 2019-08-08 DIAGNOSIS — E559 Vitamin D deficiency, unspecified: Secondary | ICD-10-CM | POA: Diagnosis not present

## 2019-08-08 DIAGNOSIS — R109 Unspecified abdominal pain: Secondary | ICD-10-CM

## 2019-08-09 MED ORDER — METOPROLOL TARTRATE 25 MG PO TABS: ORAL_TABLET | ORAL | 3 refills | Status: DC

## 2019-08-09 NOTE — Telephone Encounter (Signed)
mychart message below  Arthur Moore/ Dr Oval Linsey: When I went to fill my metropolol prescription at the pharmacy the prescription was for metropolol XL once a day 25 mg but, Dr Oval Linsey changed the prescription to metropolol reg 2 times a day 12.5 mg. Please verify and send new prescription to [facility], [facility]. Also, it is much cheaper. Thank you so much.  Arthur Moore  Per Dr Oval Linsey ok to fill Metoprolol Tart 25 mg 1/2 tablet twice a day

## 2019-08-11 ENCOUNTER — Ambulatory Visit
Admission: RE | Admit: 2019-08-11 | Discharge: 2019-08-11 | Disposition: A | Discharge status: Home / Self Care | Payer: Medicare Other | Source: Ambulatory Visit | Attending: Gastroenterology | Admitting: Gastroenterology

## 2019-08-11 DIAGNOSIS — K760 Fatty (change of) liver, not elsewhere classified: Secondary | ICD-10-CM | POA: Diagnosis not present

## 2019-08-11 DIAGNOSIS — R109 Unspecified abdominal pain: Secondary | ICD-10-CM

## 2019-08-18 DIAGNOSIS — E878 Other disorders of electrolyte and fluid balance, not elsewhere classified: Secondary | ICD-10-CM | POA: Diagnosis not present

## 2019-08-30 NOTE — Progress Notes (Signed)
NEUROLOGY FOLLOW UP OFFICE NOTE  Thos Zinter [id number]  HISTORY OF PRESENT ILLNESS: Arthur Moore is a 69 year old  male with asthma, diastolic dysfunction, arthritis, non-alcoholic cirrhosis, depression, lumbar disc disease and cervical disc disease who presents for headaches.  He is accompanied by his wife who supplements history.  He was last seen in December 2019 by my colleague, Dr. Carles Collet, for neuroleptic-induced parkinsonism.  Today, he presents for headaches.  He has had them for several years off and on.  They are moderate throbbing headaches in temples and top of head, sometimes back of head.  He had been taking Tylenol 1000mg  and ibuprofen 800mg  almost daily.   They are associated with photophobia but no nausea, phonophobia, visual disturbance, numbness and weakness.  They are aggravated by neck pain for which he receives injections (facet joint injections and trigger point injections).  Ice helps relieve them.   Over the years, the headaches flare up from time to time, lasting a couple days and occurring daily for a couple of weeks and then would return from weeks to a couple of months.  He had at least on headache a week in April.  No caffeine.   He was exercising until Covid but just started getting back into biking.  Sleep usually good, 8 to 9 hours.  He does have anxiety, sometimes has panic attacks that may interrupt his sleep.  MRI of brain with and without contrast from 04/20/2013 showed nonspecific non-enhancing multiple punctate hyperintense foci in the bilateral cerebral white matter but no acute intracranial abnormality.  MRA of brain was unremarkable.  MRA of neck showed mild plaque causing mild stenosis at the origin of the left ICA measuring less than 40%.  He has history of cervical disc disease status post C6-7 left laminectomy.  CT myelogram from 01/09/2012 showed bilateral moderate neural foraminal stenosis with mild central canal narrowing at C3-C4; moderate  canal stenosis with bilateral neural foraminal stenosis (moderate to severe on right, moderate on left) at C5-C6; mild central canal narrowing with moderate right and mild to moderate left neural foraminal stenosis at C6-C7; left interforaminal disc osteophyte complex impeding left C8 nerve root at C7-T1.  MRI of cervical spine without contrat on 11/06/2015 personally reviewed showed multilevel spondylosis with central disc protrusion and osteophyte causing flattening of cord and mild spinal stenosis at C4-5, uncinate spurring causing moderate foraminal stenosis and mild spinal stenosis at C5-6, prior laminectomy on left and moderate foraminal stenosis bilaterally at C6-7 and mild foraminal narrowing bilaterally at C7-T1.  Current NSAIDS:  ASA 81mg  daily; ibuprofen 800mg  PRN  Current analgesics:  Tylenol 1000mg ; Excedrin Migraine (helpful) Current triptans:  none Current ergotamine:  none Current anti-emetic:  none Current muscle relaxants:  none Current anti-anxiolytic:  none Current sleep aide:  none Current Antihypertensive medications:  Metoprolol 500mg  daily Current Antidepressant medications:  none Current Anticonvulsant medications:  Lamotrigine 400mg  daily Current anti-CGRP:  none Current Vitamins/Herbal/Supplements:  Magnesium oxide; Co-Q-10 Current Antihistamines/Decongestants:  Zyrtec Other therapy:  Ice Hormone/birth control:  none  Past NSAIDS:  none Past analgesics:  none Past abortive triptans:  none Past abortive ergotamine:  none Past muscle relaxants:  none Past anti-emetic:  none Past antihypertensive medications:  none Past antidepressant medications:  Cymbalta 120mg  daily; trazodone Past anticonvulsant medications:  none Past anti-CGRP:  none Past vitamins/Herbal/Supplements:  turmeric Past antihistamines/decongestants:  none Other past therapies:  none  He does snore and had a sleep study in February.  No clinically significant OSA.  PAST MEDICAL  HISTORY: Past Medical History:  Diagnosis Date  . Aortic insufficiency   . Arthritis   . Asthma   . Cervical disc disease   . Depression   . Diastolic dysfunction [id number]  . Hyperlipidemia 08/24/2015  . Hypertension   . Lumbar disc disease   . Psoriasis     MEDICATIONS: Current Outpatient Medications on File Prior to Visit  Medication Sig Dispense Refill  . albuterol (PROVENTIL HFA) 108 (90 Base) MCG/ACT inhaler Inhale 2 puffs into the lungs every 4 (four) hours as needed for wheezing or shortness of breath. 18 g 1  . aspirin EC 81 MG tablet Take 81 mg by mouth daily.    . budesonide-formoterol (SYMBICORT) 160-4.5 MCG/ACT inhaler Inhale 2 puffs into the lungs 2 (two) times daily.    . cetirizine (ZYRTEC) 10 MG tablet Take 10 mg by mouth daily.     . Cholecalciferol (VITAMIN D3) 5000 units TABS Take 5,000 Units by mouth daily.     . clobetasol ointment (TEMOVATE) [id number] % Apply 1 application topically 2 (two) times daily. 120 g 1  . Cranberry 1000 MG CAPS Take 1,000 mg by mouth daily.     . Glucosamine-Chondroit-Vit C-Mn (GLUCOSAMINE 1500 COMPLEX) CAPS Take 1 each by mouth daily.  0  . ibuprofen (ADVIL) 200 MG tablet Take 4 tablets (800 mg total) by mouth every 8 (eight) hours as needed.    . lamoTRIgine (LAMICTAL) 200 MG tablet Take 400 mg by mouth daily.     . Magnesium Oxide (MAG-OXIDE) 200 MG TABS Take 2.5 tablets (500 mg total) by mouth daily.  0  . metoprolol tartrate (LOPRESSOR) 25 MG tablet TAKE 1/2 TABLET TWICE A DAY 90 tablet 3  . Misc Natural Products (OSTEO BI-FLEX ADV JOINT SHIELD PO) Take 1 tablet by mouth daily.    . Multiple Vitamin (MULTIVITAMIN) capsule Take 1 capsule by mouth daily.    . nitroGLYCERIN (NITROSTAT) 0.4 MG SL tablet Place 1 tablet (0.4 mg total) under the tongue every 5 (five) minutes as needed for chest pain. 25 tablet 3  . Omega-3 Fatty Acids (FISH OIL) 1000 MG CAPS Take 2,400 mg by mouth daily.     . pantoprazole (PROTONIX) 40 MG tablet Take 1  tablet (40 mg total) by mouth daily. 90 tablet 1  . Probiotic Product (PROBIOTIC DAILY PO) Take 1 tablet by mouth daily.    . rosuvastatin (CRESTOR) 5 MG tablet Take 1 tablet (5 mg total) by mouth daily. 90 tablet 3  . zinc gluconate 50 MG tablet Take 1 tablet (50 mg total) by mouth daily.     No current facility-administered medications on file prior to visit.    ALLERGIES: Allergies  Allergen Reactions  . Atorvastatin Other (See Comments)    Calf pain.  Marland Kitchen Zolpidem Other (See Comments)    FAMILY HISTORY: Family History  Problem Relation Age of Onset  . Colon cancer Mother   . Lung cancer Father   . Cancer Other   . Bipolar disorder Brother   . Allergic rhinitis Neg Hx   . Angioedema Neg Hx   . Asthma Neg Hx   . Eczema Neg Hx     SOCIAL HISTORY: Social History   Socioeconomic History  . Marital status: Married    Spouse name: Not on file  . Number of children: Not on file  . Years of education: Not on file  . Highest education level: Not on file  Occupational History  . Not on  file  Tobacco Use  . Smoking status: Never Smoker  . Smokeless tobacco: Never Used  Substance and Sexual Activity  . Alcohol use: Yes    Alcohol/week: 0.0 standard drinks    Comment: occasional beer  . Drug use: No  . Sexual activity: Not on file  Other Topics Concern  . Not on file  Social History Narrative  . Not on file   Social Determinants of Health   Financial Resource Strain:   . Difficulty of Paying Living Expenses:   Food Insecurity:   . Worried About Charity fundraiser in the Last Year:   . Arboriculturist in the Last Year:   Transportation Needs:   . Film/video editor (Medical):   Marland Kitchen Lack of Transportation (Non-Medical):   Physical Activity:   . Days of Exercise per Week:   . Minutes of Exercise per Session:   Stress:   . Feeling of Stress :   Social Connections:   . Frequency of Communication with Friends and Family:   . Frequency of Social Gatherings with  Friends and Family:   . Attends Religious Services:   . Active Member of Clubs or Organizations:   . Attends Archivist Meetings:   Marland Kitchen Marital Status:   Intimate Partner Violence:   . Fear of Current or Ex-Partner:   . Emotionally Abused:   Marland Kitchen Physically Abused:   . Sexually Abused:     PHYSICAL EXAM: Blood pressure 125/72, pulse (!) 59, height 6' (1.829 m), weight 186 lb 3.2 oz (84.5 kg), SpO2 96 %. General: No acute distress.  Patient appears well-groomed.   Head:  Normocephalic/atraumatic Eyes:  Fundi examined but not visualized Neck: supple, no paraspinal tenderness, full range of motion Heart:  Regular rate and rhythm Lungs:  Clear to auscultation bilaterally Back: No paraspinal tenderness Neurological Exam: alert and oriented to person, place, and time. Attention span and concentration intact, recent and remote memory intact, fund of knowledge intact.  Speech fluent and not dysarthric, language intact.  CN II-XII intact. Bulk and tone normal, muscle strength 5/5 throughout.  Sensation to light touch, temperature and vibration intact.  Deep tendon reflexes 2+ throughout, toes downgoing.  Finger to nose and heel to shin testing intact.  Gait normal, Romberg negative.  IMPRESSION: Probable tension-type headaches, not intractable  PLAN: At this time, he would rather not start a medication because he feels that he is already on too much medicine.  He will start tracking his headaches.  He will limit use of pain relievers to no more than 2 days out of week to prevent risk of rebound or medication-overuse headache.  He will increase his water intake and has already restarted exercising.  He will contact me if he wishes to start a preventative.    Metta Clines, DO  CC: Leighton Ruff, MD

## 2019-08-31 ENCOUNTER — Encounter: Payer: Self-pay | Admitting: Neurology

## 2019-08-31 ENCOUNTER — Ambulatory Visit (INDEPENDENT_AMBULATORY_CARE_PROVIDER_SITE_OTHER): Payer: Medicare Other | Admitting: Neurology

## 2019-08-31 ENCOUNTER — Other Ambulatory Visit: Payer: Self-pay

## 2019-08-31 ENCOUNTER — Ambulatory Visit (INDEPENDENT_AMBULATORY_CARE_PROVIDER_SITE_OTHER): Payer: Medicare Other

## 2019-08-31 VITALS — BP 125/72 | HR 59 | Ht 72.0 in | Wt 186.2 lb

## 2019-08-31 DIAGNOSIS — G44219 Episodic tension-type headache, not intractable: Secondary | ICD-10-CM | POA: Diagnosis not present

## 2019-08-31 DIAGNOSIS — J309 Allergic rhinitis, unspecified: Secondary | ICD-10-CM

## 2019-08-31 DIAGNOSIS — I2510 Atherosclerotic heart disease of native coronary artery without angina pectoris: Secondary | ICD-10-CM | POA: Diagnosis not present

## 2019-08-31 NOTE — Patient Instructions (Signed)
1.  Limit use of pain relievers to no more than 2 days out of week to prevent risk of rebound or medication-overuse headache. 2.  Keep headache diary 3.  Exercise, hydration, caffeine cessation, sleep hygiene, monitor for and avoid triggers 4.  Consider:  magnesium citrate 400mg  daily, riboflavin 400mg  daily, and coenzyme Q10 100mg  three times daily 5. Always keep in mind that currently taking a hormone or birth control may be a possible trigger or aggravating factor for migraine. 6. Let me know if you wish to start a preventative medication.  We can start one and then have you follow up

## 2019-09-05 DIAGNOSIS — J3089 Other allergic rhinitis: Secondary | ICD-10-CM | POA: Diagnosis not present

## 2019-09-05 NOTE — Progress Notes (Signed)
VIALS EXP 09-04-20 

## 2019-09-06 DIAGNOSIS — J301 Allergic rhinitis due to pollen: Secondary | ICD-10-CM | POA: Diagnosis not present

## 2019-09-07 DIAGNOSIS — J3081 Allergic rhinitis due to animal (cat) (dog) hair and dander: Secondary | ICD-10-CM | POA: Diagnosis not present

## 2019-09-16 DIAGNOSIS — F339 Major depressive disorder, recurrent, unspecified: Secondary | ICD-10-CM | POA: Diagnosis not present

## 2019-09-21 DIAGNOSIS — M25561 Pain in right knee: Secondary | ICD-10-CM | POA: Diagnosis not present

## 2019-09-29 DIAGNOSIS — M25562 Pain in left knee: Secondary | ICD-10-CM | POA: Diagnosis not present

## 2019-09-29 DIAGNOSIS — M25561 Pain in right knee: Secondary | ICD-10-CM | POA: Diagnosis not present

## 2019-10-03 DIAGNOSIS — M25561 Pain in right knee: Secondary | ICD-10-CM | POA: Diagnosis not present

## 2019-10-04 ENCOUNTER — Ambulatory Visit (INDEPENDENT_AMBULATORY_CARE_PROVIDER_SITE_OTHER): Payer: Medicare Other

## 2019-10-04 DIAGNOSIS — J309 Allergic rhinitis, unspecified: Secondary | ICD-10-CM | POA: Diagnosis not present

## 2019-10-05 DIAGNOSIS — S83231A Complex tear of medial meniscus, current injury, right knee, initial encounter: Secondary | ICD-10-CM | POA: Diagnosis not present

## 2019-10-05 DIAGNOSIS — M25561 Pain in right knee: Secondary | ICD-10-CM | POA: Diagnosis not present

## 2019-10-19 MED ORDER — METOPROLOL TARTRATE 25 MG PO TABS: ORAL_TABLET | ORAL | 0 refills | Status: DC

## 2019-10-19 NOTE — Addendum Note (Signed)
Addended by: Waylan Rocher on: 10/19/2019 11:55 AM   Modules accepted: Orders

## 2019-10-26 DIAGNOSIS — E7800 Pure hypercholesterolemia, unspecified: Secondary | ICD-10-CM | POA: Diagnosis not present

## 2019-10-26 DIAGNOSIS — G4700 Insomnia, unspecified: Secondary | ICD-10-CM | POA: Diagnosis not present

## 2019-10-26 DIAGNOSIS — I10 Essential (primary) hypertension: Secondary | ICD-10-CM | POA: Diagnosis not present

## 2019-10-26 DIAGNOSIS — N400 Enlarged prostate without lower urinary tract symptoms: Secondary | ICD-10-CM | POA: Diagnosis not present

## 2019-10-26 DIAGNOSIS — I2510 Atherosclerotic heart disease of native coronary artery without angina pectoris: Secondary | ICD-10-CM | POA: Diagnosis not present

## 2019-11-04 ENCOUNTER — Ambulatory Visit (INDEPENDENT_AMBULATORY_CARE_PROVIDER_SITE_OTHER): Payer: Medicare Other

## 2019-11-04 DIAGNOSIS — J309 Allergic rhinitis, unspecified: Secondary | ICD-10-CM

## 2019-11-15 ENCOUNTER — Ambulatory Visit (INDEPENDENT_AMBULATORY_CARE_PROVIDER_SITE_OTHER): Payer: Medicare Other

## 2019-11-15 DIAGNOSIS — F411 Generalized anxiety disorder: Secondary | ICD-10-CM | POA: Diagnosis not present

## 2019-11-15 DIAGNOSIS — F39 Unspecified mood [affective] disorder: Secondary | ICD-10-CM | POA: Diagnosis not present

## 2019-11-15 DIAGNOSIS — J309 Allergic rhinitis, unspecified: Secondary | ICD-10-CM

## 2019-11-16 DIAGNOSIS — F319 Bipolar disorder, unspecified: Secondary | ICD-10-CM | POA: Diagnosis not present

## 2019-11-18 DIAGNOSIS — M25562 Pain in left knee: Secondary | ICD-10-CM | POA: Diagnosis not present

## 2019-11-18 DIAGNOSIS — M25561 Pain in right knee: Secondary | ICD-10-CM | POA: Diagnosis not present

## 2019-11-21 ENCOUNTER — Ambulatory Visit: Payer: Medicare Other | Admitting: Neurology

## 2019-11-22 ENCOUNTER — Ambulatory Visit: Payer: Medicare Other | Admitting: Allergy and Immunology

## 2019-11-29 DIAGNOSIS — K5900 Constipation, unspecified: Secondary | ICD-10-CM | POA: Diagnosis not present

## 2019-11-29 DIAGNOSIS — R109 Unspecified abdominal pain: Secondary | ICD-10-CM | POA: Diagnosis not present

## 2019-11-29 DIAGNOSIS — K921 Melena: Secondary | ICD-10-CM | POA: Diagnosis not present

## 2019-11-29 DIAGNOSIS — R231 Pallor: Secondary | ICD-10-CM | POA: Diagnosis not present

## 2019-11-30 ENCOUNTER — Ambulatory Visit (INDEPENDENT_AMBULATORY_CARE_PROVIDER_SITE_OTHER): Payer: Medicare Other | Admitting: *Deleted

## 2019-11-30 DIAGNOSIS — J309 Allergic rhinitis, unspecified: Secondary | ICD-10-CM | POA: Diagnosis not present

## 2019-11-30 DIAGNOSIS — J45909 Unspecified asthma, uncomplicated: Secondary | ICD-10-CM | POA: Diagnosis not present

## 2019-11-30 DIAGNOSIS — J019 Acute sinusitis, unspecified: Secondary | ICD-10-CM | POA: Diagnosis not present

## 2019-11-30 DIAGNOSIS — J029 Acute pharyngitis, unspecified: Secondary | ICD-10-CM | POA: Diagnosis not present

## 2019-11-30 DIAGNOSIS — J069 Acute upper respiratory infection, unspecified: Secondary | ICD-10-CM | POA: Diagnosis not present

## 2019-11-30 NOTE — Progress Notes (Addendum)
Assessment/Plan:   1.  Memory change  -Similar to when I saw him in 2019, I do not suspect any neurodegenerative process.  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  We did discuss neurocognitive testing.  -we will schedule MRI brain - expressed to patient that we may see some atrophy and WMD given age but that would not be unusual  -reassured patient that he does not Parkinsons Disease   -pt describes intrusive thoughts.  Feel confident that this is part of psychiatric ds - he is already seeing psych.   I cannot think of any primary neuro d/o that would cause intrusive thoughts such that the patient has to stay active to avoid them.  Again neurocog testing should help.   Subjective:   Arthur Moore was seen today in follow up for memory change.  My previous records as well as any outside records available were reviewed prior to todays visit.  Pt saw me back in 2019 for what he thought was Parkinson's disease.  Patient had previously seen neurology in [place] at [facility] and was diagnosed with Parkinson's disease, but subsequently had a DaTscan that was negative and was then told he did not have Parkinson's disease.  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  When I saw him, he had been off his Abilify, and had no evidence of secondary parkinsonism.  He was concerned about some memory change.  I saw none on his exam, and reassurance was provided.  He saw my partner, Dr. Tomi Likens in May, 2021.  Patient and Dr. Tomi Likens discussed episodic tension type headache.  No medication changes were made. Pt with wife today who supplements history.  When reading he is having trouble comprehending the subjects.  He may forget what he put down if sets objects down - wife states happens daily. Wife does monthly finances and always has.  Pt does his own pill box and has  down that for 5-6 years.  He will ask his wife if he has taken them but generally manages them himself.  Pt dealing with hearing issues and told that hearing issues could influence memory  He also states that he has "intrusive thoughts" over the last few months.  He states that he had a dream he was attacked with a knife and then it intruded into real life that he would attack someone.  Is seeing psychiatry and states that psychiatry just gave him zofran for this.  Noting anxiety and panic attacks because of the intrusive thoughts.  Wife notes 3 things - pt does not feel like who he is, he feels like he is looking in at himself from the outside and he has to keep himself busy all day to avoid "intrusive thoughts."   CURRENT MEDICATIONS:  Outpatient Encounter Medications as of 12/02/2019  Medication Sig  . albuterol (PROVENTIL HFA) 108 (90 Base) MCG/ACT inhaler Inhale 2 puffs into the lungs every 4 (four) hours as needed for wheezing or shortness of breath.  Marland Kitchen aspirin EC 81 MG tablet Take 81 mg by mouth daily.  . budesonide-formoterol (SYMBICORT) 160-4.5 MCG/ACT inhaler Inhale 2 puffs into the lungs 2 (two) times daily.  . cetirizine (ZYRTEC) 10 MG tablet Take 10 mg by mouth daily.   . Cholecalciferol (VITAMIN D3) 5000 units TABS Take 5,000 Units by mouth daily.   . clobetasol ointment (TEMOVATE) 0.05 % Apply 1  application topically 2 (two) times daily.  . Cranberry 1000 MG CAPS Take 1,000 mg by mouth daily.   . Glucosamine-Chondroit-Vit C-Mn (GLUCOSAMINE 1500 COMPLEX) CAPS Take 1 each by mouth daily.  Marland Kitchen ibuprofen (ADVIL) 200 MG tablet Take 4 tablets (800 mg total) by mouth every 8 (eight) hours as needed.  . lamoTRIgine (LAMICTAL) 200 MG tablet Take 400 mg by mouth daily.   . Magnesium Oxide (MAG-OXIDE) 200 MG TABS Take 2.5 tablets (500 mg total) by mouth daily.  . metoprolol tartrate (LOPRESSOR) 25 MG tablet TAKE 1/2 TABLET TWICE A DAY  . Misc Natural Products (OSTEO BI-FLEX ADV JOINT SHIELD PO)  Take 1 tablet by mouth daily.  . Multiple Vitamin (MULTIVITAMIN) capsule Take 1 capsule by mouth daily.  . nitroGLYCERIN (NITROSTAT) 0.4 MG SL tablet Place 1 tablet (0.4 mg total) under the tongue every 5 (five) minutes as needed for chest pain.  . Omega-3 Fatty Acids (FISH OIL) 1000 MG CAPS Take 2,400 mg by mouth daily.   . ondansetron (ZOFRAN) 4 MG tablet Take 4 mg by mouth every 8 (eight) hours as needed for nausea or vomiting.  . pantoprazole (PROTONIX) 40 MG tablet Take 1 tablet (40 mg total) by mouth daily.  . Probiotic Product (PROBIOTIC DAILY PO) Take 1 tablet by mouth daily.  . rosuvastatin (CRESTOR) 5 MG tablet Take 1 tablet (5 mg total) by mouth daily.  Marland Kitchen zinc gluconate 50 MG tablet Take 1 tablet (50 mg total) by mouth daily.  Marland Kitchen dicyclomine (BENTYL) 10 MG capsule Take 1 capsule by mouth 3 (three) times daily as needed.   No facility-administered encounter medications on file as of 12/02/2019.     Objective:   PHYSICAL EXAMINATION:    VITALS:   Vitals:   12/02/19 1112  BP: 111/69  Pulse: (!) 59  SpO2: 95%  Weight: 178 lb (80.7 kg)  Height: 6' (1.829 m)    GEN:  The patient appears stated age and is in NAD.  Anxious appearing HEENT:  Normocephalic, atraumatic.  The mucous membranes are moist. The superficial temporal arteries are without ropiness or tenderness. CV: Mildly bradycardic Lungs:  CTAB Neck/HEME:  There are no carotid bruits bilaterally.  Neurological examination:  Orientation: The patient is alert and oriented x3. Cranial nerves: There is good facial symmetry.The speech is fluent and clear. Soft palate rises symmetrically and there is no tongue deviation. Hearing is intact to conversational tone. Sensation: Sensation is intact to light touch throughout Motor: Strength is 5/5 in the UE/LE DTR's:  2/4 at the bilateral biceps, triceps, brachioradialis, patella.  Plantar responses are downgoing bilaterally.  Movement examination: Tone: There is normal tone  in the UE/LE Abnormal movements:  no tremor.  No myoclonus.  No asterixis.   Coordination:  There is no decremation with RAM's. Gait and Station: The patient has no difficulty arising out of a deep-seated chair without the use of the hands. The patient's stride length is good.  He has trouble ambulating in a heel-to-toe fashion, but there is some astasia-abasia.  He does not fall. Addendum: Labs received from primary care and personally reviewed by me.  Labs were dated 08/18/2019.  Sodium 141, potassium 4.5, chloride 103, CO2 24, BUN 17, creatinine 1.23, glucose 84, hemoglobin A1c 5.6, white blood cells 5.3, hemoglobin 15.4, hematocrit 44.9 and platelets low at 115.  Total time spent on today's visit was 40 minutes, including both face-to-face time and nonface-to-face time (30 in the room, 10 non face to face).  Time included  that spent on review of records (prior notes available to me/labs/imaging if pertinent), discussing treatment and goals, answering patient's questions and coordinating care.  Cc:  Leighton Ruff, MD

## 2019-12-01 DIAGNOSIS — R0981 Nasal congestion: Secondary | ICD-10-CM | POA: Diagnosis not present

## 2019-12-01 DIAGNOSIS — E7800 Pure hypercholesterolemia, unspecified: Secondary | ICD-10-CM | POA: Diagnosis not present

## 2019-12-01 DIAGNOSIS — I10 Essential (primary) hypertension: Secondary | ICD-10-CM | POA: Diagnosis not present

## 2019-12-01 DIAGNOSIS — R7303 Prediabetes: Secondary | ICD-10-CM | POA: Diagnosis not present

## 2019-12-01 DIAGNOSIS — R109 Unspecified abdominal pain: Secondary | ICD-10-CM | POA: Diagnosis not present

## 2019-12-01 DIAGNOSIS — N400 Enlarged prostate without lower urinary tract symptoms: Secondary | ICD-10-CM | POA: Diagnosis not present

## 2019-12-01 DIAGNOSIS — E559 Vitamin D deficiency, unspecified: Secondary | ICD-10-CM | POA: Diagnosis not present

## 2019-12-02 ENCOUNTER — Ambulatory Visit (INDEPENDENT_AMBULATORY_CARE_PROVIDER_SITE_OTHER): Payer: Medicare Other | Admitting: Neurology

## 2019-12-02 ENCOUNTER — Other Ambulatory Visit: Payer: Self-pay

## 2019-12-02 ENCOUNTER — Encounter: Payer: Self-pay | Admitting: Neurology

## 2019-12-02 VITALS — BP 111/69 | HR 59 | Ht 72.0 in | Wt 178.0 lb

## 2019-12-02 DIAGNOSIS — R413 Other amnesia: Secondary | ICD-10-CM | POA: Diagnosis not present

## 2019-12-02 DIAGNOSIS — I2510 Atherosclerotic heart disease of native coronary artery without angina pectoris: Secondary | ICD-10-CM | POA: Diagnosis not present

## 2019-12-02 NOTE — Patient Instructions (Addendum)
You have been referred for a neurocognitive evaluation in our office.   The evaluation has two parts.   . The first part of the evaluation is a clinical interview with the neuropsychologist (Dr. Melvyn Novas or Dr. Nicole Kindred). Please bring someone with you to this appointment if possible, as it is helpful for the doctor to hear from both you and another adult who knows you well.   . The second part of the evaluation is testing with the doctor's technician Hinton Dyer or Maudie Mercury). The testing includes a variety of tasks- mostly question-and-answer, some paper-and-pencil. There is nothing you need to do to prepare for this appointment, but having a good night's sleep prior to the testing, taking medications as you normally would, and bringing eyeglasses and hearing aids (if you wear them), is advised. Please make sure that you wear a mask to the appointment.  Please note: We have to reserve several hours of the neuropsychologist's time and the psychometrician's time for your evaluation appointment. As such, please note that there is a No-Show fee of $100. If you are unable to attend any of your appointments, please contact our office as soon as possible to reschedule.  A referral to [facility] has been placed for your MRI someone will contact you directly to schedule your appt. They are located at [place]. Please contact them directly by calling [phone] with any questions regarding your referral.  The physicians and staff at [facility] are committed to providing excellent care. You may receive a survey requesting feedback about your experience at our office. We strive to receive "very good" responses to the survey questions. If you feel that your experience would prevent you from giving the office a "very good " response, please contact our office to try to remedy the situation. We may be reached at [phone]. Thank you for taking the time out of your busy day to complete the survey.

## 2019-12-03 DIAGNOSIS — M25562 Pain in left knee: Secondary | ICD-10-CM | POA: Diagnosis not present

## 2019-12-07 ENCOUNTER — Encounter: Payer: Self-pay | Admitting: Counselor

## 2019-12-07 ENCOUNTER — Ambulatory Visit (INDEPENDENT_AMBULATORY_CARE_PROVIDER_SITE_OTHER): Payer: Medicare Other

## 2019-12-07 ENCOUNTER — Other Ambulatory Visit: Payer: Self-pay

## 2019-12-07 ENCOUNTER — Ambulatory Visit: Payer: Medicare Other | Admitting: Psychology

## 2019-12-07 ENCOUNTER — Ambulatory Visit (INDEPENDENT_AMBULATORY_CARE_PROVIDER_SITE_OTHER): Payer: Medicare Other | Admitting: Counselor

## 2019-12-07 DIAGNOSIS — F4310 Post-traumatic stress disorder, unspecified: Secondary | ICD-10-CM | POA: Diagnosis not present

## 2019-12-07 DIAGNOSIS — F09 Unspecified mental disorder due to known physiological condition: Secondary | ICD-10-CM

## 2019-12-07 DIAGNOSIS — R413 Other amnesia: Secondary | ICD-10-CM

## 2019-12-07 DIAGNOSIS — J309 Allergic rhinitis, unspecified: Secondary | ICD-10-CM | POA: Diagnosis not present

## 2019-12-07 NOTE — Progress Notes (Signed)
   Psychometrist Note   Cognitive testing was administered to [employer] by Milana Kidney, B.S. (Technician) under the supervision of Alphonzo Severance, Psy.D., ABN. Arthur Moore was able to tolerate all test procedures. Dr. Nicole Kindred met with the patient as needed to manage any emotional reactions to the testing procedures. Rest breaks were offered.    The battery of tests administered was selected by Dr. Nicole Kindred with consideration to the patient's current level of functioning, the nature of his symptoms, emotional and behavioral responses during the interview, level of literacy, observed level of motivation/effort, and the nature of the referral question. This battery was communicated to the psychometrist. Communication between Dr. Nicole Kindred and the psychometrist was ongoing throughout the evaluation and Dr. Nicole Kindred was immediately accessible at all times. Dr. Nicole Kindred provided supervision to the technician on the date of this service, to the extent necessary to assure the quality of all services provided.    Arthur Moore will return in approximately one week for an interactive feedback session with Dr. Nicole Kindred, at which time test performance, clinical impressions, and treatment recommendations will be reviewed in detail. The patient understands he can contact our office should he require our assistance before this time.   A total of 135 minutes of billable time were spent with Arthur Moore by the technician, including test administration and scoring time. Billing for these services is reflected in Dr. Les Pou note.   This note reflects time spent with the psychometrician and does not include test scores, clinical history, or any interpretations made by Dr. Nicole Kindred. The full report will follow in a separate note.
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
## 2019-12-08 NOTE — Progress Notes (Signed)
[facility]  Patient Name: Mehtaab Mayeda MRN: [id number] Date of Birth: 02/12/1951 Age: 69 y.o. Education: 17 years  Measurement properties of test scores: IQ, Index, and Standard Scores (SS): Mean = 100; Standard Deviation = 15 Scaled Scores (Ss): Mean = 10; Standard Deviation = 3 Z scores (Z): Mean = 0; Standard Deviation = 1 T scores (T); Mean = 50; Standard Deviation = 10  TEST SCORES:    Note: This summary of test scores accompanies the interpretive report and should not be interpreted by unqualified individuals or in isolation without reference to the report. Test scores are relative to age, gender, and educational history as available and appropriate.   Performance Validity        ACS: Raw Descriptor      Word Choice: 47 Within Expectation      The Dot Counting Test: Raw Descriptor      E-Score 8 Within Expectation      Embedded Measures: Raw Descriptor      RBANS Effort Index: 1 Within Expectation      WAIS-IV Reliable Digit Span 8 Within Expectation      WAIS-IV Reliable Digit Span Revised 10 Below Expectation      Expected Functioning        Wide Range Achievement Test (Word Reading): Standard/Scaled Score Percentile       Word Reading 116 86      Cognitive Testing        RBANS, Form : Standard/Scaled Score Percentile  Total Score 78 7  Immediate Memory 76 5      List Learning 6 9      Story Memory 5 5  Visuospatial/Constructional 75 5      Figure Copy   (15) 5 5      Judgment of Line Orientation   (13) --- 10-16  Language 85 16      Picture Naming --- 51-75      Semantic Fluency 4 2  Attention 100 50      Digit Span 10 50      Coding 10 50  Delayed Memory 78 7      List Recall   (1) --- 3-9      List Recognition   (17) --- 3-9      Story Recall   (7) 8 25      Figure Recall   (12) 9 37      Wechsler Adult Intelligence Scale - IV: Standard/Scaled Score Percentile  Working Memory Index 80 9      Digit  Span 6 9          Digit Span Forward 8 25          Digit Span Backward 9 37          Digit Span Sequencing 3 1      Arithmetic 7 16  Processing Speed Index 94 34      Symbol Search 5 5      Coding 13 84      Neuropsychological Assessment Battery (Language Module): T-score Percentile      Naming   (31) 55 69      Verbal Fluency: T-score Percentile      Controlled Oral Word Association (F-A-S) 48 42      Semantic Fluency (Animals) 46 34      Trail Making Test: T-Score Percentile      Part A 59 82      Part B 48  [place] Card Sorting Test (MWCST): Standard/T-Score Percentile      Number of Categories Correct 36 8      Number of Perseverative Errors 40 16      Number of Total Errors 40 16      Percent Perseverative Errors 45 31  Executive Function Composite 80 9      [place] Diagnostic Aphasia Exam: Raw Score Scaled Score      Complex Ideational Material 10 7      Clock Drawing Raw Score Descriptor      Command 10 WNL      Rating Scales         Raw Score Descriptor  Patient Health Questionnaire - 9 8 Mild  GAD-7 9 Mild  PTSD Checklist - 5 35 Positive      Personality Assessment Inventory: T-Score Percentile      Inconsistency (ICN) 49 ---      Infrequency (INF) 44 ---      Negative Impression (NIM) 66 ---      Positive Impression (PIM) 52 ---      Somatic Complaints (SOM) 48 ---      Anxiety (ANX) 50 ---      Anxiety-Related Disorders (ARD) 49 ---      Depression (DEP) 45 ---      Mania (MAN) 48 ---      Paranoia (PAR) 39 ---      Schizophrenia (SCZ) 47 ---      Borderline Features (BOR) 53 ---      Antisocial Features (ANT) 47 ---      Alcohol Problems (ALC) 41 ---      Drug Problems (DRG) 48 ---      Aggression (AGG) 51 ---      Suicidal Ideation (SUI) 43 ---      Stress (STR) 48 ---      Non Support (NON) 37 ---      Treatment Rejection (RXR) 51 ---      Dominance (DOM) 67 ---      Warmth (WRM) 60 ---   Viviano Simas. Nicole Kindred PsyD,  [place] Clinical Neuropsychologist

## 2019-12-09 DIAGNOSIS — M25561 Pain in right knee: Secondary | ICD-10-CM | POA: Diagnosis not present

## 2019-12-09 DIAGNOSIS — M25562 Pain in left knee: Secondary | ICD-10-CM | POA: Diagnosis not present

## 2019-12-12 DIAGNOSIS — Z03818 Encounter for observation for suspected exposure to other biological agents ruled out: Secondary | ICD-10-CM | POA: Diagnosis not present

## 2019-12-12 DIAGNOSIS — J0110 Acute frontal sinusitis, unspecified: Secondary | ICD-10-CM | POA: Diagnosis not present

## 2019-12-12 DIAGNOSIS — B349 Viral infection, unspecified: Secondary | ICD-10-CM | POA: Diagnosis not present

## 2019-12-13 NOTE — Progress Notes (Signed)
[facility]  Patient Name: Arthur Moore MRN: [id number] Date of Birth: Sep 12, 1950 Age: 69 y.o. Education: 11 years  Clinical Impressions  Arthur Moore is a 69 y.o., right-hand dominant, married man with a history of longstanding psychiatric issues, neuroleptic induced Parkinsonism (now resolved), and concerns about memory and thinking since 2019. He is a patient of Dr. Doristine Devoid and consulted with her recently due to concerns about dementia. He is having difficulties with intrusive thoughts, often of a violent nature, although he has no desire or impulse to act on them. He also has some likely related symptoms including feelings of depersonalization and not feeling like himself. He endorses minor cognitive problems including misplacing things and difficulties putting words together but is essentially independent and functioning at his normal level day-to-day, albeit with less efficiency. 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   On neuropsychological testing, [name] demonstrated high average word reading suggesting good premorbid ability and performance well below that, in the unusually low range on a measure of overall cognitive function. Nevertheless, the profile reflects performance consistency issues as opposed to any focal or diagnostic impairment. He does not appear to have memory storage problems or other findings suggestive of prodromal AD. On detailed assessment of emotional and psychological status, he endorsed items in a manner suggesting that he has experienced a traumatic event that continues to be a source of distress and anxiety. He also endorsed problems with concentration and decision making that are nonspecific but tend to be associated with affective disorders. He reported moderate levels of depression and anxiety symptoms and may meet criteria for  PTSD.   I concur with Dr. Carles Collet that this is unlikely due to a neurodegenerative or other progressive cause. 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 Intrusive thoughts of perceived dangerousness to others and depersonalization are common in trauma-related conditions. Such disorders can have delayed onset and/or variable symptomatology over time, and as such, his report of recent worsening is not inconsistent with that impression. If he does have significant progression of vascular disease or other findings on his MRI, it is possible that there is a vascular contribution to his cognitive problems and that some of his worsening is neuropsychiatric, but that is unlikely the sole or most significant cause.   Diagnostic Impressions: Other symptoms and signs involving cognitive functions and awareness Other trauma and stressor related disorder  Recommendations to be discussed with patient  Your performance and presentation on assessment were consistent with high levels of expected ability and overall cognitive performance falling below that expectation; however, the pattern of scores suggests difficulties with performance consistency as opposed to any focal or diagnostic impairment. My sense is that this is primarily a difficulty with what is called "executive control."  Executive control is a higher order cognitive ability involved in regulating other cognitive resources. Much like the conductor of an orchestra coordinates multiple instruments to make music, executive capacities coordinate other lower-order skills (e.g., movement, language, attention) to form complex human behaviors. Individuals with executive control problems are often capable of doing most of the things they did before they were having problems, but they may not do so as effortlessly, efficiently, and consistently. These difficulties often manifest as problems tracking  information, multitasking, and paying attention. Executive control problems often result in cognitive inefficiency and can present as "memory problems," because they decrease encoding  and spontaneous retrieval of information.   In your case, I think that your executive control difficulties are likely on the basis of a psychiatric problem. I would like to underscore that this does not invalidate your problems as being significant or life-interfering. Psychiatric difficulties can be equally as interfering or more interfering than other so-called "organic" causes of impairment. It is important to realize though that psychiatric difficulties are not typically progressive and often respond well to treatment.   I think that a diagnosis of PTSD likely best fits your ongoing symptoms, although that could be further clarified and refined in a course of ongoing treatment. While you have tried some treatments in the past, it does not sound as though you have tried trauma-focused therapy, and your resurgent symptoms may be a sign that now is the time to best address your difficulties.   The good news is that there are numerous trauma-focused treatments for PTSD and related disorders, including Cognitive Processing Therapy (CPT) and PE (Prolonged Exposure). In general, these treatments involve re-experiencing the traumatic event in a graded way, through writing or other means, identifying the ways in which it has affected you and your life, and changing thought patterns or emotional reactions in a manner that works better for you.   Test Findings  Test scores are summarized in additional documentation associated with this encounter. Test scores are relative to age, gender, and educational history as available and appropriate. There were no concerns about performance validity as all findings fell within normal expectations.   General Intellectual Functioning/Achievement:  Performance on single-word reading was high  average, which presents as a reasonable standard of comparison for Mr. Crickenberger cognitive test data given his level of educational and occupational achievement.   Attention and Processing Efficiency: Working memory fell at a low average level on the Working Memory Index of the WAIS-IV, albeit with some inconsistency in his performance across different indicators. Digit repetition forward and backward were average whereas Mr. Clingenpeel had difficulty with digit resequencing in ascending order, generating an extremely low score. Mental solving of arithmetical word problems was low average.   Performance on indicators of processing efficiency was average on the Processing Speed Index of the WAIS-IV, although once again, with significant intrasubtest variability suggesting performance consistency issues. Performance was high average on one measure of timed number symbol coding and average on another. Performance was unusually low on a symbol matching to sample test. Simple numeric sequencing was high average.   Language: Language findings were variable with intact visual object confrontation naming, semantic fluency in response to the category prompt "animals," and phonemic fluency in response to the letters F-A-S. Semantic fluency was extremely low, however, in response to the category prompt "fruits and vegetables."   Visuospatial Function: Performance was low on visuospatial and constructional measures, with an unusually low score on the relevant RBANS Index. In his case, I think this likely reflects lack of attention to detail and a rushed approach as opposed to bona-fide visuospatial impairment per se. Figure copy was unusually low but he only took 35" to draw the figure and made many careless errors, suggesting lack of attentiveness to detail. He did better on judgment of angular line orientations, which was low average.   Learning and Memory: Performance on measures of learning and memory was low  although there was fairly significant variability across indicators and mainly encoding as opposed to storage issues. My sense is that performance consistency and executive control issues attenuated his performance.  In the verbal realm, immediate recall was unusually low for a word list and short story. His delayed recall for the word list was comparable and fell at an unusually low level, but he retained the story fairly well with average delayed recall. Delayed recognition for the word list was unusually low.   In the visual realm, delayed recall for a modestly complex figure stimulus was good and fell at an average level.   Executive Functions: Performance on executive indicators was within gross limits of normal. He generated a low average Therapist, music on the [employer]. His categories score and perseverative errors score both hovered around the low average to unusually low ranges. Alternating sequencing of numbers and letters was better with an average score as was generation of words in response to the letters F-A-S. Reasoning with verbal information was low average on the complex ideational material. Clock drawing was within normal limits with normal face formation, number placement, and hand placement.   Rating Scale(s): Mr. Casella completed the PAI, a detailed measure of emotional and psychological functioning. There were indications of a tendency towards underreporting of negative information although not at a level that frankly invalidates the profile, warranting some interpretive caution. He endorsed items in a manner suggesting that he has experienced a traumatic event that continues to be a source of anxiety and distress. There are indications that he may demonstrate tendencies toward controlling behavior in interpersonal relationships. While well-intentioned on the part of the patient, this behavior may be interpreted less positively by others.  He also endorsed problems with attention and concentration that are nonspecific but tend to be associated, in particular, with severe affective disorders. There were no other significant scale elevations. He reported mild yet clinically significant levels of depression and anxiety symptoms on other rating scales and responded to an inventory of trauma-related symptoms in a manner suggesting that he may warrant a formal diagnosis of PTSD.   Viviano Simas Nicole Kindred PsyD, [place] Clinical Neuropsychologist

## 2019-12-14 ENCOUNTER — Encounter: Payer: Medicare Other | Admitting: Counselor

## 2019-12-14 DIAGNOSIS — H9313 Tinnitus, bilateral: Secondary | ICD-10-CM | POA: Diagnosis not present

## 2019-12-14 DIAGNOSIS — M961 Postlaminectomy syndrome, not elsewhere classified: Secondary | ICD-10-CM | POA: Diagnosis not present

## 2019-12-14 DIAGNOSIS — H903 Sensorineural hearing loss, bilateral: Secondary | ICD-10-CM | POA: Diagnosis not present

## 2019-12-20 ENCOUNTER — Encounter: Payer: Medicare Other | Admitting: Counselor

## 2019-12-21 ENCOUNTER — Ambulatory Visit (INDEPENDENT_AMBULATORY_CARE_PROVIDER_SITE_OTHER): Payer: Medicare Other | Admitting: *Deleted

## 2019-12-21 DIAGNOSIS — J309 Allergic rhinitis, unspecified: Secondary | ICD-10-CM

## 2019-12-22 ENCOUNTER — Ambulatory Visit: Payer: Medicare Other | Admitting: Cardiovascular Disease

## 2019-12-23 DIAGNOSIS — K219 Gastro-esophageal reflux disease without esophagitis: Secondary | ICD-10-CM | POA: Diagnosis not present

## 2019-12-23 DIAGNOSIS — R109 Unspecified abdominal pain: Secondary | ICD-10-CM | POA: Diagnosis not present

## 2019-12-23 DIAGNOSIS — Z86010 Personal history of colonic polyps: Secondary | ICD-10-CM | POA: Diagnosis not present

## 2019-12-23 DIAGNOSIS — Z800 Family history of malignant neoplasm of digestive organs: Secondary | ICD-10-CM | POA: Diagnosis not present

## 2019-12-23 DIAGNOSIS — K7460 Unspecified cirrhosis of liver: Secondary | ICD-10-CM | POA: Diagnosis not present

## 2019-12-23 DIAGNOSIS — R110 Nausea: Secondary | ICD-10-CM | POA: Diagnosis not present

## 2019-12-25 DIAGNOSIS — R05 Cough: Secondary | ICD-10-CM | POA: Diagnosis not present

## 2019-12-25 DIAGNOSIS — R0981 Nasal congestion: Secondary | ICD-10-CM | POA: Diagnosis not present

## 2019-12-25 DIAGNOSIS — Z03818 Encounter for observation for suspected exposure to other biological agents ruled out: Secondary | ICD-10-CM | POA: Diagnosis not present

## 2019-12-25 DIAGNOSIS — J329 Chronic sinusitis, unspecified: Secondary | ICD-10-CM | POA: Diagnosis not present

## 2019-12-25 DIAGNOSIS — Z8709 Personal history of other diseases of the respiratory system: Secondary | ICD-10-CM | POA: Diagnosis not present

## 2019-12-26 ENCOUNTER — Other Ambulatory Visit: Payer: Self-pay

## 2019-12-26 ENCOUNTER — Ambulatory Visit
Admission: RE | Admit: 2019-12-26 | Discharge: 2019-12-26 | Disposition: A | Discharge status: Home / Self Care | Payer: Medicare Other | Source: Ambulatory Visit | Attending: Neurology | Admitting: Neurology

## 2019-12-26 DIAGNOSIS — I771 Stricture of artery: Secondary | ICD-10-CM | POA: Diagnosis not present

## 2019-12-26 DIAGNOSIS — R519 Headache, unspecified: Secondary | ICD-10-CM | POA: Diagnosis not present

## 2019-12-26 DIAGNOSIS — G9389 Other specified disorders of brain: Secondary | ICD-10-CM | POA: Diagnosis not present

## 2019-12-26 DIAGNOSIS — R9082 White matter disease, unspecified: Secondary | ICD-10-CM | POA: Diagnosis not present

## 2019-12-26 DIAGNOSIS — R413 Other amnesia: Secondary | ICD-10-CM

## 2019-12-27 DIAGNOSIS — N400 Enlarged prostate without lower urinary tract symptoms: Secondary | ICD-10-CM | POA: Diagnosis not present

## 2019-12-27 DIAGNOSIS — E7800 Pure hypercholesterolemia, unspecified: Secondary | ICD-10-CM | POA: Diagnosis not present

## 2019-12-27 DIAGNOSIS — J4540 Moderate persistent asthma, uncomplicated: Secondary | ICD-10-CM | POA: Diagnosis not present

## 2019-12-27 DIAGNOSIS — I2510 Atherosclerotic heart disease of native coronary artery without angina pectoris: Secondary | ICD-10-CM | POA: Diagnosis not present

## 2019-12-27 DIAGNOSIS — G4700 Insomnia, unspecified: Secondary | ICD-10-CM | POA: Diagnosis not present

## 2019-12-27 DIAGNOSIS — I10 Essential (primary) hypertension: Secondary | ICD-10-CM | POA: Diagnosis not present

## 2019-12-30 ENCOUNTER — Ambulatory Visit (INDEPENDENT_AMBULATORY_CARE_PROVIDER_SITE_OTHER): Payer: Medicare Other | Admitting: *Deleted

## 2019-12-30 DIAGNOSIS — J309 Allergic rhinitis, unspecified: Secondary | ICD-10-CM

## 2020-01-01 ENCOUNTER — Emergency Department (HOSPITAL_COMMUNITY)
Admission: EM | Admit: 2020-01-01 | Discharge: 2020-01-01 | Disposition: A | Discharge status: Home / Self Care | Payer: Medicare Other | Attending: Emergency Medicine | Admitting: Emergency Medicine

## 2020-01-01 ENCOUNTER — Other Ambulatory Visit: Payer: Self-pay

## 2020-01-01 ENCOUNTER — Emergency Department (HOSPITAL_COMMUNITY): Payer: Medicare Other

## 2020-01-01 DIAGNOSIS — R079 Chest pain, unspecified: Secondary | ICD-10-CM | POA: Diagnosis not present

## 2020-01-01 DIAGNOSIS — J45909 Unspecified asthma, uncomplicated: Secondary | ICD-10-CM | POA: Diagnosis not present

## 2020-01-01 DIAGNOSIS — Z79899 Other long term (current) drug therapy: Secondary | ICD-10-CM | POA: Diagnosis not present

## 2020-01-01 DIAGNOSIS — M5410 Radiculopathy, site unspecified: Secondary | ICD-10-CM | POA: Diagnosis not present

## 2020-01-01 DIAGNOSIS — I2510 Atherosclerotic heart disease of native coronary artery without angina pectoris: Secondary | ICD-10-CM | POA: Diagnosis not present

## 2020-01-01 DIAGNOSIS — R0789 Other chest pain: Secondary | ICD-10-CM | POA: Diagnosis not present

## 2020-01-01 DIAGNOSIS — Z7982 Long term (current) use of aspirin: Secondary | ICD-10-CM | POA: Insufficient documentation

## 2020-01-01 DIAGNOSIS — R5381 Other malaise: Secondary | ICD-10-CM | POA: Diagnosis not present

## 2020-01-01 DIAGNOSIS — M25519 Pain in unspecified shoulder: Secondary | ICD-10-CM | POA: Diagnosis not present

## 2020-01-01 DIAGNOSIS — Z7951 Long term (current) use of inhaled steroids: Secondary | ICD-10-CM | POA: Insufficient documentation

## 2020-01-01 DIAGNOSIS — N182 Chronic kidney disease, stage 2 (mild): Secondary | ICD-10-CM | POA: Diagnosis not present

## 2020-01-01 DIAGNOSIS — I129 Hypertensive chronic kidney disease with stage 1 through stage 4 chronic kidney disease, or unspecified chronic kidney disease: Secondary | ICD-10-CM | POA: Diagnosis not present

## 2020-01-01 LAB — CBC
HCT: 46 % (ref 39.0–52.0)
Hemoglobin: 15.3 g/dL (ref 13.0–17.0)
MCH: 29.7 pg (ref 26.0–34.0)
MCHC: 33.3 g/dL (ref 30.0–36.0)
MCV: 89.1 fL (ref 80.0–100.0)
Platelets: 122 10*3/uL — ABNORMAL LOW (ref 150–400)
RBC: 5.16 MIL/uL (ref 4.22–5.81)
RDW: 12.6 % (ref 11.5–15.5)
WBC: 10.1 10*3/uL (ref 4.0–10.5)
nRBC: 0 % (ref 0.0–0.2)

## 2020-01-01 LAB — BASIC METABOLIC PANEL
Anion gap: 9 (ref 5–15)
BUN: 28 mg/dL — ABNORMAL HIGH (ref 8–23)
CO2: 24 mmol/L (ref 22–32)
Calcium: 9.5 mg/dL (ref 8.9–10.3)
Chloride: 106 mmol/L (ref 98–111)
Creatinine, Ser: 1.26 mg/dL — ABNORMAL HIGH (ref 0.61–1.24)
GFR calc Af Amer: >60 mL/min (ref >60)
GFR calc non Af Amer: 58 mL/min — ABNORMAL LOW (ref >60)
Glucose, Bld: 99 mg/dL (ref 70–99)
Potassium: 4.6 mmol/L (ref 3.5–5.1)
Sodium: 139 mmol/L (ref 135–145)

## 2020-01-01 LAB — TROPONIN I (HIGH SENSITIVITY)
Troponin I (High Sensitivity): 5 ng/L (ref <18)
Troponin I (High Sensitivity): 6 ng/L (ref <18)

## 2020-01-01 MED ORDER — METHYLPREDNISOLONE 4 MG PO TBPK: ORAL_TABLET | ORAL | 0 refills | Status: DC

## 2020-01-01 NOTE — Discharge Instructions (Signed)
Your testing today shows that you have not had no evidence of a heart attack.  I spoke with the heart doctor on-call who agrees that your EKG is not concerning for a heart attack.  Please continue to take your medications exactly as prescribed, you should take steroids as prescribed, follow-up with your doctor this week, see the follow-up list above, you will need to see her heart doctor this week as well.  Return to the emergency department for any severe worsening symptoms this could include numbness weakness or pain of your arm as well as chest pain or shortness of breath

## 2020-01-01 NOTE — ED Triage Notes (Signed)
Pt said that yesterday he started having chest pain and numbness in his left arm. Pt said the pain has gotten worse today. Pt also having some left arm pain. Pt said a little nausea today. Pain radiates into his left shoulder blade.

## 2020-01-01 NOTE — ED Provider Notes (Signed)
[facility] DEPART[facility] Provider Note   CSN: [id number] Arrival date & time: 01/01/20  0316     History Chief Complaint  Patient presents with  . Chest Pain    Arthur Moore is a 69 y.o. male.  HPI   had some chest pain last night - associated with some shoulder and arm symptoms - pain and numbness and tingling - followed by CAD due to a "blockage" in the heart.  Review of the medical record shows that in March 2020 approximately 18 months ago he had a left heart catheterization and coronary angiogram showing 20% right coronary, 10% proximal circumflex, 20% distal left main to ostial LAD, 50% first diagonal with normal left ventricular function and an ejection fraction of 50 to 55%.  Has followed with NS due to cervical stenosis, MRI performed July 2017 of the cervical spine showed diffuse cervical spondylosis with mild spinal foraminal stenosis as well as general spinal stenosis at different levels, there is no significant stenosis.  Prior laminectomy at C6-C7.  At this time he is CP free - complaining of only L arm sx.  Has hx of headaches - no n/v/syncope.  Past Medical History:  Diagnosis Date  . Aortic insufficiency   . Arthritis   . Asthma   . Cervical disc disease   . Depression   . Diastolic dysfunction 3/79/0240  . Hyperlipidemia 08/24/2015  . Hypertension   . Lumbar disc disease   . Psoriasis     Patient Active Problem List   Diagnosis Date Noted  . 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  . CAD (coronary artery disease) 07/09/2018  . 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 Chest pain of uncertain etiology 97/35/3299  . Chronic cough 06/21/2018  . Thrombocytopenia ([place]) 12/01/2016  . Transaminitis 12/01/2016  . Diastolic dysfunction 24/26/8341  . MDD (major depressive disorder), recurrent severe, without psychosis ([place]) 10/13/2015  . Hyperlipidemia 08/24/2015  . Arthritis   . Depression    . Hypertension   . Aortic insufficiency   . Psoriasis   . Cervical disc disease   . Lumbar disc disease   . Asthma 01/06/2015  . Psoriatic arthritis ([place]) 01/06/2015  . Allergic rhinoconjunctivitis 01/06/2015  . GERD (gastroesophageal reflux disease) 01/06/2015  . Laryngopharyngeal reflux (LPR) 01/06/2015    Past Surgical History:  Procedure Laterality Date  . CHOLECYSTECTOMY  2012  . HERNIA REPAIR    . LEFT HEART CATH AND CORONARY ANGIOGRAPHY N/A 07/08/2018   Procedure: LEFT HEART CATH AND CORONARY ANGIOGRAPHY;  Surgeon: Burnell Blanks, MD;  Location: [facility];  Service: Cardiovascular;  Laterality: N/A;  . PAROTIDECTOMY Right 1992  . PROSTATE SURGERY  2002, 2004  . SINOSCOPY    . SINUS EXPLORATION  2013  . SPINE SURGERY  2013   L C7-T1 laminectomy and discectomy  . TONSILLECTOMY         Family History  Problem Relation Age of Onset  . Colon cancer Mother   . Lung cancer Father   . Cancer Other   . Bipolar disorder Brother   . Allergic rhinitis Neg Hx   . Angioedema Neg Hx   . Asthma Neg Hx   . Eczema Neg Hx     Social History   Tobacco Use  . Smoking status: Never Smoker  . Smokeless tobacco: Never Used  Vaping Use  . Vaping Use: Never used  Substance Use Topics  . Alcohol use: Yes    Alcohol/week:  0.0 standard drinks    Comment: occasional beer  . Drug use: No    Home Medications Prior to Admission medications   Medication Sig Start Date End Date Taking? Authorizing Provider  albuterol (PROVENTIL HFA) 108 (90 Base) MCG/ACT inhaler Inhale 2 puffs into the lungs every 4 (four) hours as needed for wheezing or shortness of breath. 04/28/19   Kennith Gain, MD  aspirin EC 81 MG tablet Take 81 mg by mouth daily.    [provider]  budesonide-formoterol (SYMBICORT) 160-4.5 MCG/ACT inhaler Inhale 2 puffs into the lungs 2 (two) times daily.    [provider]  cetirizine (ZYRTEC) 10 MG tablet Take 10 mg by mouth  daily.     [provider]  Cholecalciferol (VITAMIN D3) 5000 units TABS Take 5,000 Units by mouth daily.     [provider]  clobetasol ointment (TEMOVATE) 2.09 % Apply 1 application topically 2 (two) times daily. 02/22/19   Kennith Gain, MD  Cranberry 1000 MG CAPS Take 1,000 mg by mouth daily.     [provider]  dicyclomine (BENTYL) 10 MG capsule Take 1 capsule by mouth 3 (three) times daily as needed.    [provider]  Glucosamine-Chondroit-Vit C-Mn (GLUCOSAMINE 1500 COMPLEX) CAPS Take 1 each by mouth daily. 07/26/18   Lendon Colonel, NP  ibuprofen (ADVIL) 200 MG tablet Take 4 tablets (800 mg total) by mouth every 8 (eight) hours as needed. 11/08/18   Fatima Blank, MD  lamoTRIgine (LAMICTAL) 200 MG tablet Take 400 mg by mouth daily.     [provider]  Magnesium Oxide (MAG-OXIDE) 200 MG TABS Take 2.5 tablets (500 mg total) by mouth daily. 07/26/18   Lendon Colonel, NP  methylPREDNISolone (MEDROL DOSEPAK) 4 MG TBPK tablet Taper over 6 days 01/01/20   Noemi Chapel, MD  metoprolol tartrate (LOPRESSOR) 25 MG tablet TAKE 1/2 TABLET TWICE A DAY 10/19/19   Skeet Latch, MD  Misc Natural Products (OSTEO BI-FLEX ADV JOINT SHIELD PO) Take 1 tablet by mouth daily.    [provider]  Multiple Vitamin (MULTIVITAMIN) capsule Take 1 capsule by mouth daily.    [provider]  nitroGLYCERIN (NITROSTAT) 0.4 MG SL tablet Place 1 tablet (0.4 mg total) under the tongue every 5 (five) minutes as needed for chest pain. 05/13/19 12/02/19  Deberah Pelton, NP  Omega-3 Fatty Acids (FISH OIL) 1000 MG CAPS Take 2,400 mg by mouth daily.     [provider]  ondansetron (ZOFRAN) 4 MG tablet Take 4 mg by mouth every 8 (eight) hours as needed for nausea or vomiting.    [provider]  pantoprazole (PROTONIX) 40 MG tablet Take 1 tablet (40 mg total) by mouth daily. 04/04/19   Kennith Gain, MD   Probiotic Product (PROBIOTIC DAILY PO) Take 1 tablet by mouth daily.    [provider]  rosuvastatin (CRESTOR) 5 MG tablet Take 1 tablet (5 mg total) by mouth daily. 04/07/19 12/02/19  Skeet Latch, MD  zinc gluconate 50 MG tablet Take 1 tablet (50 mg total) by mouth daily. 07/26/18   Lendon Colonel, NP    Allergies    Atorvastatin and Zolpidem  Review of Systems   Review of Systems  All other systems reviewed and are negative.   Physical Exam Updated Vital Signs BP 135/80   Pulse 63   Temp 97.7 F (36.5 C) (Oral)   Resp 12   SpO2 98%   Physical Exam  Vitals and nursing note reviewed.  Constitutional:      General: He is not in acute distress.    Appearance: He is well-developed.  HENT:     Head: Normocephalic and atraumatic.     Mouth/Throat:     Pharynx: No oropharyngeal exudate.  Eyes:     General: No scleral icterus.       Right eye: No discharge.        Left eye: No discharge.     Conjunctiva/sclera: Conjunctivae normal.     Pupils: Pupils are equal, round, and reactive to light.  Neck:     Thyroid: No thyromegaly.     Vascular: No JVD.  Cardiovascular:     Rate and Rhythm: Normal rate and regular rhythm.     Heart sounds: Normal heart sounds. No murmur heard.  No friction rub. No gallop.   Pulmonary:     Effort: Pulmonary effort is normal. No respiratory distress.     Breath sounds: Normal breath sounds. No wheezing or rales.  Abdominal:     General: Bowel sounds are normal. There is no distension.     Palpations: Abdomen is soft. There is no mass.     Tenderness: There is no abdominal tenderness.  Musculoskeletal:        General: No tenderness. Normal range of motion.     Cervical back: Normal range of motion and neck supple.     Right lower leg: No tenderness. No edema.     Left lower leg: No tenderness. No edema.  Lymphadenopathy:     Cervical: No cervical adenopathy.  Skin:    General: Skin is warm and dry.     Findings: No  erythema or rash.  Neurological:     Mental Status: He is alert.     Coordination: Coordination normal.     Comments: Dec sensation LUE on the small finger.  Slight decrease sensation over the rest of the hand on the left as well as up the arm towards the shoulder.  He has totally normal strength in the bilateral upper extremities, normal speech, normal strength in the legs.  Psychiatric:        Behavior: Behavior normal.     ED Results / Procedures / Treatments   Labs (all labs ordered are listed, but only abnormal results are displayed) Labs Reviewed  BASIC METABOLIC PANEL - Abnormal; Notable for the following components:      Result Value   BUN 28 (*)    Creatinine, Ser 1.26 (*)    GFR calc non Af Amer 58 (*)    All other components within normal limits  CBC - Abnormal; Notable for the following components:   Platelets 122 (*)    All other components within normal limits  TROPONIN I (HIGH SENSITIVITY)  TROPONIN I (HIGH SENSITIVITY)    EKG EKG Interpretation  Date/Time:  Sunday January 01 2020 03:30:53 EDT Ventricular Rate:  63 PR Interval:  154 QRS Duration: 88 QT Interval:  412 QTC Calculation: 421 R Axis:   87 Text Interpretation: Normal sinus rhythm abnormal appearance to lead lll, compared with prior ECG changes now seen Confirmed by Noemi Chapel [phone]) on 01/01/2020 12:00:54 PM   Radiology DG Chest 2 View  Result Date: 01/01/2020 CLINICAL DATA:  Chest pain EXAM: CHEST - 2 VIEW COMPARISON:  04/30/2019 FINDINGS: Heart and mediastinal contours are within normal limits. No focal opacities or effusions. No acute bony abnormality. Aortic atherosclerosis. IMPRESSION: No active cardiopulmonary disease. Electronically Signed  By: Rolm Baptise M.D.   On: 01/01/2020 03:54    Procedures Procedures (including critical care time)  Medications Ordered in ED Medications - No data to display  ED Course  I have reviewed the triage vital signs and the nursing  notes.  Pertinent labs & imaging results that were available during my care of the patient were reviewed by me and considered in my medical decision making (see chart for details).    MDM Rules/Calculators/A&P                           The patient is not having any chest pain at all at this time, he did have some yesterday, he has some decrease sensation in the left arm, it seems to be somewhat stocking glove though seems to be more dense towards the small finger which she states is chronic.  His EKG does show a slight abnormality in lead III on the limb leads, he does have some obstructive disease which only went up to about 50% in one artery, I will discuss this with cardiology, have them take a look at the EKG, thankfully he has 2 - troponins.  D/w Dr. Acie Fredrickson - has [facility] at [facility] doesn't think this is obstructive - needs f/u in the office.  Will treat with steroids, presumably radicular.  Final Clinical Impression(s) / ED Diagnoses Final diagnoses:  Radiculopathy of arm  Left-sided chest pain    Rx / DC Orders ED Discharge Orders         Ordered    methylPREDNISolone (MEDROL DOSEPAK) 4 MG TBPK tablet        01/01/20 1257           Noemi Chapel, MD 01/01/20 1257

## 2020-01-02 DIAGNOSIS — F319 Bipolar disorder, unspecified: Secondary | ICD-10-CM | POA: Diagnosis not present

## 2020-01-02 DIAGNOSIS — F429 Obsessive-compulsive disorder, unspecified: Secondary | ICD-10-CM | POA: Diagnosis not present

## 2020-01-03 ENCOUNTER — Encounter: Payer: Self-pay | Admitting: Cardiovascular Disease

## 2020-01-03 ENCOUNTER — Other Ambulatory Visit: Payer: Self-pay

## 2020-01-03 ENCOUNTER — Ambulatory Visit (INDEPENDENT_AMBULATORY_CARE_PROVIDER_SITE_OTHER): Payer: Medicare Other | Admitting: Cardiovascular Disease

## 2020-01-03 VITALS — BP 120/72 | HR 75 | Ht 72.0 in | Wt 178.8 lb

## 2020-01-03 DIAGNOSIS — I351 Nonrheumatic aortic (valve) insufficiency: Secondary | ICD-10-CM

## 2020-01-03 DIAGNOSIS — I2510 Atherosclerotic heart disease of native coronary artery without angina pectoris: Secondary | ICD-10-CM | POA: Diagnosis not present

## 2020-01-03 DIAGNOSIS — I10 Essential (primary) hypertension: Secondary | ICD-10-CM | POA: Diagnosis not present

## 2020-01-03 MED ORDER — METOPROLOL TARTRATE 25 MG PO TABS: ORAL_TABLET | ORAL | 3 refills | Status: DC

## 2020-01-03 NOTE — Progress Notes (Signed)
Cardiology Office Note   Date:  01/03/2020  ID:  Arthur Moore, DOB [birth year], MRN [id number]  PCP:  Leighton Ruff, MD  Cardiologist:   Skeet Latch, MD   No chief complaint on file.   History of Present Illness: Arthur Moore is a 69 y.o. male with mild CAD, mild-moderate aortic insufficiency, hypertension, asthma,TIA, OSA not on CPAP, liver fibrosis, anxiety, and depression who presents for follow up.  [name] wore a Holter monitor 06/2013 that revealed rare PACs and PVCs.  He previously followed up with a cardiologist in [place] due to mild AR.  He subsequently had an echo at the [facility] in 2015 that reported it as mild-moderate.  He had a repeat echocardiogram 12/05/15 revealed LVEF 55-60% with grade 2 diastolic. Aortic regurgitation remained mild to moderate.  [name] reported fatigue and atypical chest pain.  He was referred for an exercise Myoview 3/18 that revealed LVEF 49% and asynchronous contraction. There was a small defect in the basal inferior region that was thought to be diaphragmatic attenuation.  He achieved 7 METS on a Bruce protocol.  He called our office after these results due to persistent chest pain and it was suggested that he follow up with GI.  He was subsequently diagnosed with diverticulitis.  [name] saw [name], PA-C, for chest pain.  He had previously been seen in the ED with chest pain.  Cardiac enzymes and CT of the chest, abdomen, and pelvis were unremarkable.  His chest pain was atypical and occurred when he was trying to lie down in bed at night.  He was referred for coronary CT-A 04/2017 that revealed a coronary calcium score 361 which is 78th percentile.  He had extensive calcified plaque in the proximal LAD with possible moderate stenosis.  FFR was negative.  Repeat echo 08/2017 revealed LVEF 55 to 65% with grade 1 diastolic dysfunction.  Since his last appointment he was seen in the ED 06/2018 with chest pain.  Cardiac enzymes  were negative.  He underwent cardiac catheterization 06/2018 which revealed 50% stenosis in D1 and otherwise 10 to 20% stenoses in the distal RCA, proximal left circumflex, distal left main, and proximal LAD.  Mr. Pavon was started on Vascepa for hypertriglyceridemia and CAD.  It worked well for his triglycerides.  However he was unable to afford it.  He tried to get it filled through the [facility] and was denied.  Metoprolol succinate was also denied as were PCSK9 inhibitors.    Lately [name] has been doing very well.  He continues to have a very healthy diet and is exercising every day.  His wife prepares all the meals at home and is very conscientious about her diet.  He only has red meat once per month.  He increased rosuvastatin to 10 mg on his own.  He is tolerating this without myalgias.  Mr. [name] was seen by Coletta Memos on 04/2019 after a visit to the ED for chest pain.  His ischemia evaluation was unremarkable.  His chest pain was worse with inhalation.  He had no exertional chest pain.  No further work-up was performed.  He was again seen in the ED last week with chest pain that was atypical and likely neuropathic.  Work-up was again unremarkable.  He had numbness in his arm for 2-3 days and then he started having pain. The symptoms were continuous.  He has been playing some pickle ball for exercise.   He recently had blood work  at the [facility] and the numbers were excellent.  His lipids were all at [facility].  He has no LE edema, orthopnea or PND.  He has no exertional chest pain but he does have GERD.  His weight has been stable.      Past Medical History:  Diagnosis Date  . Aortic insufficiency   . Arthritis   . Asthma   . Cervical disc disease   . Depression   . Diastolic dysfunction 3/61/4431  . Hyperlipidemia 08/24/2015  . Hypertension   . Lumbar disc disease   . Psoriasis     Past Surgical History:  Procedure Laterality Date  . CHOLECYSTECTOMY  2012  . HERNIA REPAIR    . LEFT  HEART CATH AND CORONARY ANGIOGRAPHY N/A 07/08/2018   Procedure: LEFT HEART CATH AND CORONARY ANGIOGRAPHY;  Surgeon: Burnell Blanks, MD;  Location: [facility];  Service: Cardiovascular;  Laterality: N/A;  . PAROTIDECTOMY Right 1992  . PROSTATE SURGERY  2002, 2004  . SINOSCOPY    . SINUS EXPLORATION  2013  . SPINE SURGERY  2013   L C7-T1 laminectomy and discectomy  . TONSILLECTOMY       Current Outpatient Medications  Medication Sig Dispense Refill  . albuterol (PROVENTIL HFA) 108 (90 Base) MCG/ACT inhaler Inhale 2 puffs into the lungs every 4 (four) hours as needed for wheezing or shortness of breath. 18 g 1  . aspirin EC 81 MG tablet Take 81 mg by mouth daily.    . budesonide-formoterol (SYMBICORT) 160-4.5 MCG/ACT inhaler Inhale 2 puffs into the lungs 2 (two) times daily.    . Cholecalciferol (VITAMIN D3) 5000 units TABS Take 5,000 Units by mouth daily.     Marland Kitchen CRANBERRY PO Take 900 mg by mouth daily.     Marland Kitchen lamoTRIgine (LAMICTAL) 200 MG tablet Take 400 mg by mouth daily.     . Magnesium Oxide (MAG-OXIDE) 200 MG TABS Take 2.5 tablets (500 mg total) by mouth daily.  0  . metoprolol tartrate (LOPRESSOR) 25 MG tablet TAKE 1/2 TABLET TWICE A DAY 90 tablet 3  . Misc Natural Products (OSTEO BI-FLEX ADV JOINT SHIELD PO) Take 1 tablet by mouth daily.    . Multiple Vitamin (MULTIVITAMIN) capsule Take 1 capsule by mouth daily.    . pantoprazole (PROTONIX) 40 MG tablet Take 1 tablet (40 mg total) by mouth daily. 90 tablet 1  . Probiotic Product (PROBIOTIC DAILY PO) Take 1 tablet by mouth daily.    Marland Kitchen QUERCETIN PO Take by mouth.    . rosuvastatin (CRESTOR) 5 MG tablet Take 1 tablet (5 mg total) by mouth daily. 90 tablet 3  . zinc gluconate 50 MG tablet Take 1 tablet (50 mg total) by mouth daily.    . nitroGLYCERIN (NITROSTAT) 0.4 MG SL tablet Place 1 tablet (0.4 mg total) under the tongue every 5 (five) minutes as needed for chest pain. 25 tablet 3   No current facility-administered  medications for this visit.    Allergies:   Atorvastatin and Zolpidem    Social History:  The patient  reports that he has never smoked. He has never used smokeless tobacco. He reports current alcohol use. He reports that he does not use drugs.   Family History:  The patient's family history includes Bipolar disorder in his brother; Cancer in an other family member; Colon cancer in his mother; Lung cancer in his father.    ROS:  Please see the history of present illness.   Otherwise, review  of systems are positive for none.   All other systems are reviewed and negative.    PHYSICAL EXAM: VS:  BP 120/72   Pulse 75   Ht 6' (1.829 m)   Wt 178 lb 12.8 oz (81.1 kg)   SpO2 98%   BMI 24.25 kg/m  , BMI Body mass index is 24.25 kg/m. GENERAL:  Well appearing HEENT: Pupils equal round and reactive, fundi not visualized, oral mucosa unremarkable NECK:  No jugular venous distention, waveform within normal limits, carotid upstroke brisk and symmetric, no bruits LUNGS:  Clear to auscultation bilaterally HEART:  RRR.  PMI not displaced or sustained,S1 and S2 within normal limits, no S3, no S4, no clicks, no rubs, no murmurs ABD:  Flat, positive bowel sounds normal in frequency in pitch, no bruits, no rebound, no guarding, no midline pulsatile mass, no hepatomegaly, no splenomegaly EXT:  2 plus pulses throughout, no edema, no cyanosis no clubbing SKIN:  No rashes no nodules NEURO:  Cranial nerves II through XII grossly intact, motor grossly intact throughout PSYCH:  Cognitively intact, oriented to person place and time   EKG:  EKG is not ordered today. 06/20/16: Sinus rhythm. Rate 64 bpm. 12/29/17: Sinus rhythm.  Rate 61 bpm. 03/16/2019: Sinus rhythm.  Rate 64 bpm. 01/01/2020: Sinus rhythm.  Rate 63 bpm.  Echo 04/25/14: LVEF 60%.  Aortic valve sclerosis without stenosis. Mild aortic insufficiency.  ETT 04/2013:  Exercise 8 minutes and 44 seconds on a Bruce protocol. Peak heart rate 136. 9.8  METS. No ischemic changes.  24 Hour Holter: Underlying rhythm is sinus. Average heart rate 74, minimum 52, max 123. Rare PACs and PVCs. No significant arrhythmia noted.  Echo 12/05/15: Study Conclusions  - Left ventricle: Global LV longitudinal strain is normal at -16.2%   There is a false tendon in the LV apex of no clinical   significance. The cavity size was normal. There was moderate   focal basal hypertrophy. Systolic function was normal. The   estimated ejection fraction was in the range of 55% to 60%. Wall   motion was normal; there were no regional wall motion   abnormalities. Features are consistent with a pseudonormal left   ventricular filling pattern, with concomitant abnormal relaxation   and increased filling pressure (grade 2 diastolic dysfunction). - Aortic valve: Trileaflet; mildly thickened leaflets. There was   mild to moderate regurgitation directed eccentrically in the LVOT   and towards the mitral anterior leaflet. - Mitral valve: There was mild regurgitation. - Pulmonic valve: There was mild regurgitation.  Echo 08/28/17: Study Conclusions  - Left ventricle: The cavity size was normal. There was mild   concentric hypertrophy. Systolic function was normal. The   estimated ejection fraction was in the range of 55% to 65%. Wall   motion was normal; there were no regional wall motion   abnormalities. Doppler parameters are consistent with abnormal   left ventricular relaxation (grade 1 diastolic dysfunction). - Aortic valve: There was mild to moderate regurgitation directed   eccentrically to the anterior leaflet of the mitral valve. - Left atrium: The atrium was normal in size. - Right ventricle: Systolic function was normal. - Pulmonary arteries: Systolic pressure was within the normal   range.  Exercise Myoview 06/26/16: Nuclear stress EF: 49%. Asynchronous contraction.  The left ventricular ejection fraction is mildly decreased (45-54%).  There was no ST  segment deviation noted during stress.  Defect 1: There is a small defect of mild severity present in the basal  inferior location. Likely diaphragmatic attenuation artifact.  This is a low risk study. No ischemia identified  Coronary CT-A 05/15/17: IMPRESSION: 1. Coronary artery calcium score 361 Agatston units, placing the patient in the 78th percentile for age and gender, suggesting intermediate to high risk for future cardiac events.  2. Extensive calcified plaque in the proximal LAD, possible moderate stenosis though blooming artifact may make the disease look worse than it actually is.  FFR 0.83 in mid LAD. I do no think there is hemodynamically significant disease in the LAD. The LCx and RCA have no hemodynamically significant disease.  LHC 07/08/18:  Dist RCA lesion is 20% stenosed.  Prox Cx lesion is 10% stenosed.  Dist LM to Ost LAD lesion is 20% stenosed.  Ost 1st Diag lesion is 50% stenosed.  The left ventricular systolic function is normal.  LV end diastolic pressure is normal.  The left ventricular ejection fraction is 50-55% by visual estimate.  There is no mitral valve regurgitation.  Prox LAD lesion is 20% stenosed.   1. Mild non-obstructive CAD 2. Low normal LV systolic function   Echo 08/9161: Study Conclusions  - Left ventricle: The cavity size was normal. There was mild   concentric hypertrophy. Systolic function was normal. The   estimated ejection fraction was in the range of 55% to 65%. Wall   motion was normal; there were no regional wall motion   abnormalities. Doppler parameters are consistent with abnormal   left ventricular relaxation (grade 1 diastolic dysfunction). - Aortic valve: There was mild to moderate regurgitation directed   eccentrically to the anterior leaflet of the mitral valve. - Left atrium: The atrium was normal in size. - Right ventricle: Systolic function was normal. - Pulmonary arteries: Systolic pressure was  within the normal   range.   Recent Labs: 04/30/2019: ALT 33 01/01/2020: BUN 28; Creatinine, Ser 1.26; Hemoglobin 15.3; Platelets 122; Potassium 4.6; Sodium 139    Lipid Panel    Component Value Date/Time   CHOL 102 03/08/2019 1043   TRIG 94 03/08/2019 1043   HDL 34 (L) 03/08/2019 1043   CHOLHDL 3.0 03/08/2019 1043   CHOLHDL 4.1 07/09/2018 0407   VLDL 25 07/09/2018 0407   LDLCALC 50 03/08/2019 1043      11/2014: Total cholesterol 130, HDL 39, LDL 65, triglycerides 116 Total chol 133, tri 211, ldl 55, hdl 34  Wt Readings from Last 3 Encounters:  01/03/20 178 lb 12.8 oz (81.1 kg)  12/02/19 178 lb (80.7 kg)  08/31/19 186 lb 3.2 oz (84.5 kg)      ASSESSMENT AND PLAN:  # Moderate, non-obstructive CAD:  # Hyperlipidemia:  # Atypical chest pain:  Left heart cath revealed 50% ostial D1 disease and otherwise minimal, non-obstructive CAD.  Continue medical management with aspirin, metoprolol, and rosuvastatin.  LDL was 50 on 02/2019.  He is doing very well and has no exertional chest pain.  His arm discomfort was radicular pain from his neck.   # Aortic regurgitation: Stable on echo 11/2015 and 08/2017.  Will repeat if needed based on symptoms.  He has no heart failure symptoms at this time.  # Diastolic dysfunction:  # Hypertension:  Grade 2 on echo improved to grade 1 diastolic dysfunction on repeat.  He is euvolemic and blood pressure is well-controlled.  Continue metoprolol.  # TIA: Continue statin and aspirin 81 mg daily.  # Depression: # Anxiety:   Symptoms improving with new therapy.  His mood continues to be much better.  Continue exercise.   Current medicines are reviewed at length with the patient today.  The patient does not have concerns regarding medicines.  The following changes have been made: none  Labs/ tests ordered today include:   No orders of the defined types were placed in this encounter.   Disposition:   FU with Jonavin Seder C. Oval Linsey, MD in 6 months.     Signed, Skeet Latch, MD  01/03/2020 9:04 AM    [place]

## 2020-01-03 NOTE — Patient Instructions (Signed)
Medication Instructions:  Your physician recommends that you continue on your current medications as directed. Please refer to the Current Medication list given to you today. *If you need a refill on your cardiac medications before your next appointment, please call your pharmacy*  Lab Work: NONE  Testing/Procedures: NONE  Follow-Up: At [facility], you and your health needs are our priority.  As part of our continuing mission to provide you with exceptional heart care, we have created designated Provider Care Teams.  These Care Teams include your primary Cardiologist (physician) and Advanced Practice Providers (APPs -  Physician Assistants and Nurse Practitioners) who all work together to provide you with the care you need, when you need it.  We recommend signing up for the patient portal called "MyChart".  Sign up information is provided on this After Visit Summary.  MyChart is used to connect with patients for Virtual Visits (Telemedicine).  Patients are able to view lab/test results, encounter notes, upcoming appointments, etc.  Non-urgent messages can be sent to your provider as well.   To learn more about what you can do with MyChart, go to [email or web address].    Your next appointment:   6 month(s)  The format for your next appointment:   In Person  Provider:   You may see Skeet Latch, MD or one of the following Advanced Practice Providers on your designated Care Team:    Kerin Ransom, PA-C  [place], [place]  Coletta Memos, FNP  Other Instructions FAX YOUR LABS TO [facility]

## 2020-01-04 ENCOUNTER — Ambulatory Visit (INDEPENDENT_AMBULATORY_CARE_PROVIDER_SITE_OTHER): Payer: Medicare Other

## 2020-01-04 DIAGNOSIS — L814 Other melanin hyperpigmentation: Secondary | ICD-10-CM | POA: Diagnosis not present

## 2020-01-04 DIAGNOSIS — X32XXXS Exposure to sunlight, sequela: Secondary | ICD-10-CM | POA: Diagnosis not present

## 2020-01-04 DIAGNOSIS — D225 Melanocytic nevi of trunk: Secondary | ICD-10-CM | POA: Diagnosis not present

## 2020-01-04 DIAGNOSIS — J309 Allergic rhinitis, unspecified: Secondary | ICD-10-CM | POA: Diagnosis not present

## 2020-01-04 DIAGNOSIS — L578 Other skin changes due to chronic exposure to nonionizing radiation: Secondary | ICD-10-CM | POA: Diagnosis not present

## 2020-01-04 DIAGNOSIS — D1801 Hemangioma of skin and subcutaneous tissue: Secondary | ICD-10-CM | POA: Diagnosis not present

## 2020-01-04 DIAGNOSIS — L813 Cafe au lait spots: Secondary | ICD-10-CM | POA: Diagnosis not present

## 2020-01-09 ENCOUNTER — Ambulatory Visit (INDEPENDENT_AMBULATORY_CARE_PROVIDER_SITE_OTHER): Payer: Medicare Other | Admitting: Counselor

## 2020-01-09 ENCOUNTER — Other Ambulatory Visit: Payer: Self-pay

## 2020-01-09 DIAGNOSIS — F4310 Post-traumatic stress disorder, unspecified: Secondary | ICD-10-CM

## 2020-01-09 NOTE — Patient Instructions (Addendum)
Your performance and presentation on assessment were consistent with high levels of expected ability and overall cognitive performance falling below that expectation; however, the pattern of scores suggests difficulties with performance consistency as opposed to any focal or diagnostic impairment. My sense is that this is primarily a difficulty with what is called "executive control."  Executive control is a higher order cognitive ability involved in regulating other cognitive resources. Much like the conductor of an orchestra coordinates multiple instruments to make music, executive capacities coordinate other lower-order skills (e.g., movement, language, attention) to form complex human behaviors. Individuals with executive control problems are often capable of doing most of the things they did before they were having problems, but they may not do so as effortlessly, efficiently, and consistently. These difficulties often manifest as problems tracking information, multitasking, and paying attention. Executive control problems often result in cognitive inefficiency and can present as "memory problems," because they decrease encoding and spontaneous retrieval of information.   In your case, I think that your executive control difficulties are likely on the basis of a psychiatric problem. I would like to underscore that this does not invalidate your problems as being significant or life-interfering. Psychiatric difficulties can be equally as interfering or more interfering than other so-called "organic" causes of impairment. It is important to realize though that psychiatric difficulties are not typically progressive and often respond well to treatment.   I think that a diagnosis of PTSD likely best fits your ongoing symptoms, although that could be further clarified and refined in a course of ongoing treatment. While you have tried some treatments in the past, it does not sound as though you have tried  trauma-focused therapy, and your resurgent symptoms may be a sign that now is the time to best address your difficulties.   The good news is that there are numerous trauma-focused treatments for PTSD and related disorders, including Cognitive Processing Therapy (CPT) and PE (Prolonged Exposure). In general, these treatments involve re-experiencing the traumatic event in a graded way, through writing or other means, identifying the ways in which it has affected you and your life, and changing thought patterns or emotional reactions in a manner that works better for you.   You were interested in a referral for trauma-focused treatment, preferably from a [place] perspective, and I offered a referral to [facility] who should be in touch.

## 2020-01-10 ENCOUNTER — Encounter: Payer: Self-pay | Admitting: Counselor

## 2020-01-10 NOTE — Progress Notes (Signed)
NEUROPSYCHOLOGY FEEDBACK NOTE [facility]  Feedback Note: I met with Arthur Moore to review the findings resulting from his neuropsychological evaluation. He was accompanied by his wife, Arthur Moore. Since the last appointment, he has been about the same. He continues to be bothered by intrusive thoughts, although he feels as though they have abated to some extent over the past several weeks. 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 He also works with wood. [facility] not been [facility] but tries to work in his yard. Time was spent reviewing the impressions and recommendations that are detailed in the evaluation report. We discussed in detail the impression of primarily psychiatric problems resulting in executive control issues. I explained typical symptoms we expect to see in trauma related conditions, including dissociation, intrusive thoughts, and attention/concentration problems. We discussed mindfulness and I recommended bibliotherapy toward that end. He has already tried a number of treatments over the years and thinks he may have tried CPT for PTSD, but it wasn't entirely clear from his description. I provided my strong recommendation for trauma-focused treatment. He is already following with psychiatry. He was referred to [facility] for treatment. We also reviewed his brain MRI. sI took time to explain the findings and answer all the patient's questions. I encouraged Arthur Moore to contact me should he have any further questions or if further follow up is desired.   Current Medications and Medical History   Current Outpatient Medications  Medication Sig Dispense Refill  . albuterol (PROVENTIL HFA) 108 (90 Base) MCG/ACT inhaler Inhale 2 puffs into the lungs every 4 (four) hours as needed for wheezing or shortness of breath. 18 g 1  . aspirin EC 81 MG tablet Take 81 mg by  mouth daily.    . budesonide-formoterol (SYMBICORT) 160-4.5 MCG/ACT inhaler Inhale 2 puffs into the lungs 2 (two) times daily.    . Cholecalciferol (VITAMIN D3) 5000 units TABS Take 5,000 Units by mouth daily.     Marland Kitchen CRANBERRY PO Take 900 mg by mouth daily.     Marland Kitchen lamoTRIgine (LAMICTAL) 200 MG tablet Take 400 mg by mouth daily.     . Magnesium Oxide (MAG-OXIDE) 200 MG TABS Take 2.5 tablets (500 mg total) by mouth daily.  0  . metoprolol tartrate (LOPRESSOR) 25 MG tablet TAKE 1/2 TABLET TWICE A DAY 90 tablet 3  . Misc Natural Products (OSTEO BI-FLEX ADV JOINT SHIELD PO) Take 1 tablet by mouth daily.    . Multiple Vitamin (MULTIVITAMIN) capsule Take 1 capsule by mouth daily.    . nitroGLYCERIN (NITROSTAT) 0.4 MG SL tablet Place 1 tablet (0.4 mg total) under the tongue every 5 (five) minutes as needed for chest pain. 25 tablet 3  . pantoprazole (PROTONIX) 40 MG tablet Take 1 tablet (40 mg total) by mouth daily. 90 tablet 1  . Probiotic Product (PROBIOTIC DAILY PO) Take 1 tablet by mouth daily.    Marland Kitchen QUERCETIN PO Take by mouth.    . rosuvastatin (CRESTOR) 5 MG tablet Take 1 tablet (5 mg total) by mouth daily. 90 tablet 3  . zinc gluconate 50 MG tablet Take 1 tablet (50 mg total) by mouth daily.     No current facility-administered medications for this visit.   Patient Active Problem List   Diagnosis Date Noted  . 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  . CAD (coronary artery disease) 07/09/2018  .  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 Chest pain of uncertain etiology 01/60/1093  . Chronic cough 06/21/2018  . Thrombocytopenia ([place]) 12/01/2016  . Transaminitis 12/01/2016  . Diastolic dysfunction 23/55/7322  . MDD (major depressive disorder), recurrent severe, without psychosis ([place]) 10/13/2015  . Hyperlipidemia 08/24/2015  . Arthritis   . Depression   . Hypertension   . Aortic insufficiency   . Psoriasis   . Cervical disc disease   . Lumbar disc  disease   . Asthma 01/06/2015  . Psoriatic arthritis ([place]) 01/06/2015  . Allergic rhinoconjunctivitis 01/06/2015  . GERD (gastroesophageal reflux disease) 01/06/2015  . Laryngopharyngeal reflux (LPR) 01/06/2015   Mental Status and Behavioral Observations  Arthur Moore presented on time for this appointment and was dressed in appropriate, casual clothing. He presented as generally oriented but orientation was not formally assessed. Speech was normal in rate, rhythm, volume, and prosody. Self-reported mood was "good", although as above he continues to struggle with symptoms and affect as assessed by vocal quality was neutral. Thought process was logical and goal oriented and thought content was appropriate. There were no safety concerns identified at today's encounter, such as thoughts of harming self or others.   Plan  Feedback provided regarding the patient's neuropsychological evaluation. I provided him with a referral for psychotherapy and my strong recommendation to engage in a trauma-focused treatment directed at his PTSD symptoms. Arthur Moore was encouraged to contact me if any questions arise or if further follow up is desired.   Arthur Simas Nicole Kindred, Arthur Moore, ABN Clinical Neuropsychologist  Service(s) Provided at This Encounter: 4 minutes [phone]; Psychotherapy with patient/family)

## 2020-01-11 DIAGNOSIS — H9313 Tinnitus, bilateral: Secondary | ICD-10-CM | POA: Diagnosis not present

## 2020-01-11 DIAGNOSIS — H903 Sensorineural hearing loss, bilateral: Secondary | ICD-10-CM | POA: Diagnosis not present

## 2020-01-12 DIAGNOSIS — R509 Fever, unspecified: Secondary | ICD-10-CM | POA: Diagnosis not present

## 2020-01-12 DIAGNOSIS — R05 Cough: Secondary | ICD-10-CM | POA: Diagnosis not present

## 2020-01-13 DIAGNOSIS — R05 Cough: Secondary | ICD-10-CM | POA: Diagnosis not present

## 2020-01-13 DIAGNOSIS — R509 Fever, unspecified: Secondary | ICD-10-CM | POA: Diagnosis not present

## 2020-01-17 ENCOUNTER — Ambulatory Visit: Payer: Medicare Other | Admitting: Allergy and Immunology

## 2020-01-17 DIAGNOSIS — Z20822 Contact with and (suspected) exposure to covid-19: Secondary | ICD-10-CM | POA: Diagnosis not present

## 2020-01-19 DIAGNOSIS — Z20822 Contact with and (suspected) exposure to covid-19: Secondary | ICD-10-CM | POA: Diagnosis not present

## 2020-01-25 DIAGNOSIS — Z1159 Encounter for screening for other viral diseases: Secondary | ICD-10-CM | POA: Diagnosis not present

## 2020-01-26 DIAGNOSIS — M4722 Other spondylosis with radiculopathy, cervical region: Secondary | ICD-10-CM | POA: Diagnosis not present

## 2020-01-30 DIAGNOSIS — K319 Disease of stomach and duodenum, unspecified: Secondary | ICD-10-CM | POA: Diagnosis not present

## 2020-01-30 DIAGNOSIS — R110 Nausea: Secondary | ICD-10-CM | POA: Diagnosis not present

## 2020-01-30 DIAGNOSIS — K3189 Other diseases of stomach and duodenum: Secondary | ICD-10-CM | POA: Diagnosis not present

## 2020-01-30 DIAGNOSIS — R1084 Generalized abdominal pain: Secondary | ICD-10-CM | POA: Diagnosis not present

## 2020-02-02 DIAGNOSIS — K319 Disease of stomach and duodenum, unspecified: Secondary | ICD-10-CM | POA: Diagnosis not present

## 2020-02-05 ENCOUNTER — Emergency Department (HOSPITAL_COMMUNITY)
Admission: EM | Admit: 2020-02-05 | Discharge: 2020-02-05 | Disposition: A | Discharge status: Home / Self Care | Payer: Medicare Other | Attending: Emergency Medicine | Admitting: Emergency Medicine

## 2020-02-05 ENCOUNTER — Other Ambulatory Visit: Payer: Self-pay

## 2020-02-05 ENCOUNTER — Encounter (HOSPITAL_COMMUNITY): Payer: Self-pay | Admitting: Emergency Medicine

## 2020-02-05 DIAGNOSIS — N182 Chronic kidney disease, stage 2 (mild): Secondary | ICD-10-CM | POA: Diagnosis not present

## 2020-02-05 DIAGNOSIS — I10 Essential (primary) hypertension: Secondary | ICD-10-CM | POA: Diagnosis not present

## 2020-02-05 DIAGNOSIS — Z79899 Other long term (current) drug therapy: Secondary | ICD-10-CM | POA: Diagnosis not present

## 2020-02-05 DIAGNOSIS — J45909 Unspecified asthma, uncomplicated: Secondary | ICD-10-CM | POA: Insufficient documentation

## 2020-02-05 DIAGNOSIS — M79662 Pain in left lower leg: Secondary | ICD-10-CM | POA: Insufficient documentation

## 2020-02-05 DIAGNOSIS — Z7982 Long term (current) use of aspirin: Secondary | ICD-10-CM | POA: Insufficient documentation

## 2020-02-05 DIAGNOSIS — I129 Hypertensive chronic kidney disease with stage 1 through stage 4 chronic kidney disease, or unspecified chronic kidney disease: Secondary | ICD-10-CM | POA: Diagnosis not present

## 2020-02-05 LAB — BASIC METABOLIC PANEL
Anion gap: 9 (ref 5–15)
BUN: 20 mg/dL (ref 8–23)
CO2: 23 mmol/L (ref 22–32)
Calcium: 9.2 mg/dL (ref 8.9–10.3)
Chloride: 109 mmol/L (ref 98–111)
Creatinine, Ser: 1.24 mg/dL (ref 0.61–1.24)
GFR, Estimated: 59 mL/min — ABNORMAL LOW (ref >60)
Glucose, Bld: 120 mg/dL — ABNORMAL HIGH (ref 70–99)
Potassium: 3.8 mmol/L (ref 3.5–5.1)
Sodium: 141 mmol/L (ref 135–145)

## 2020-02-05 LAB — CBC
HCT: 42.5 % (ref 39.0–52.0)
Hemoglobin: 14.9 g/dL (ref 13.0–17.0)
MCH: 30.8 pg (ref 26.0–34.0)
MCHC: 35.1 g/dL (ref 30.0–36.0)
MCV: 88 fL (ref 80.0–100.0)
Platelets: 129 10*3/uL — ABNORMAL LOW (ref 150–400)
RBC: 4.83 MIL/uL (ref 4.22–5.81)
RDW: 12.6 % (ref 11.5–15.5)
WBC: 5.9 10*3/uL (ref 4.0–10.5)
nRBC: 0 % (ref 0.0–0.2)

## 2020-02-05 LAB — URINALYSIS, ROUTINE W REFLEX MICROSCOPIC
Bilirubin Urine: NEGATIVE
Glucose, UA: NEGATIVE mg/dL
Hgb urine dipstick: NEGATIVE
Ketones, ur: NEGATIVE mg/dL
Leukocytes,Ua: NEGATIVE
Nitrite: NEGATIVE
Protein, ur: NEGATIVE mg/dL
Specific Gravity, Urine: 1.011 (ref 1.005–1.030)
pH: 5 (ref 5.0–8.0)

## 2020-02-05 LAB — CBG MONITORING, ED: Glucose-Capillary: 109 mg/dL — ABNORMAL HIGH (ref 70–99)

## 2020-02-05 NOTE — ED Provider Notes (Signed)
[facility] DEPT Provider Note   CSN: [id number] Arrival date & time: 02/05/20  1932     History Chief Complaint  Patient presents with  . Leg Pain    L calf    Arthur Moore is a 69 y.o. male.  The history is provided by medical records and the patient.  Leg Pain   69 y.o. male with history of aortic insufficiency, arthritis, asthma, depression, hypertension, hyperlipidemia, chronic kidney disease, chronic thrombocytopenia, presenting to the ED with left calf pain.  States this began 3-4 days ago and has been persistent.  Pain worse with walking, states it feels like his calf is being over stretched and pain will radiate up into thigh sometimes.  Denies any worsening of pain but remains persistent.  Denies any leg swelling, redness, or fever.  No recent injury, trauma, falls, or strenuous activity.  No recent travel or prolonged immobilization.  He did have endoscopy last week but whole procedure only lasted 20 mins or so.  Denies history of DVT or PE.  No chest pain or SOB.  No meds PTA.  Past Medical History:  Diagnosis Date  . Aortic insufficiency   . Arthritis   . Asthma   . Cervical disc disease   . Depression   . Diastolic dysfunction 08/04/8117  . Hyperlipidemia 08/24/2015  . Hypertension   . Lumbar disc disease   . Psoriasis     Patient Active Problem List   Diagnosis Date Noted  . 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  . CAD (coronary artery disease) 07/09/2018  . 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 Chest pain of uncertain etiology 14/78/2956  . Chronic cough 06/21/2018  . Thrombocytopenia ([place]) 12/01/2016  . Transaminitis 12/01/2016  . Diastolic dysfunction 21/30/8657  . MDD (major depressive disorder), recurrent severe, without psychosis ([place]) 10/13/2015  . Hyperlipidemia 08/24/2015  . Arthritis   . Depression   . Hypertension   . Aortic insufficiency   . Psoriasis   .  Cervical disc disease   . Lumbar disc disease   . Asthma 01/06/2015  . Psoriatic arthritis ([place]) 01/06/2015  . Allergic rhinoconjunctivitis 01/06/2015  . GERD (gastroesophageal reflux disease) 01/06/2015  . Laryngopharyngeal reflux (LPR) 01/06/2015    Past Surgical History:  Procedure Laterality Date  . CHOLECYSTECTOMY  2012  . HERNIA REPAIR    . LEFT HEART CATH AND CORONARY ANGIOGRAPHY N/A 07/08/2018   Procedure: LEFT HEART CATH AND CORONARY ANGIOGRAPHY;  Surgeon: Burnell Blanks, MD;  Location: [facility];  Service: Cardiovascular;  Laterality: N/A;  . PAROTIDECTOMY Right 1992  . PROSTATE SURGERY  2002, 2004  . SINOSCOPY    . SINUS EXPLORATION  2013  . SPINE SURGERY  2013   L C7-T1 laminectomy and discectomy  . TONSILLECTOMY         Family History  Problem Relation Age of Onset  . Colon cancer Mother   . Lung cancer Father   . Cancer Other   . Bipolar disorder Brother   . Allergic rhinitis Neg Hx   . Angioedema Neg Hx   . Asthma Neg Hx   . Eczema Neg Hx     Social History   Tobacco Use  . Smoking status: Never Smoker  . Smokeless tobacco: Never Used  Vaping Use  . Vaping Use: Never used  Substance Use Topics  . Alcohol use: Yes    Alcohol/week: 0.0 standard drinks    Comment: occasional beer  .  Drug use: No    Home Medications Prior to Admission medications   Medication Sig Start Date End Date Taking? Authorizing Provider  albuterol (PROVENTIL HFA) 108 (90 Base) MCG/ACT inhaler Inhale 2 puffs into the lungs every 4 (four) hours as needed for wheezing or shortness of breath. 04/28/19   Kennith Gain, MD  aspirin EC 81 MG tablet Take 81 mg by mouth daily.    [provider]  budesonide-formoterol (SYMBICORT) 160-4.5 MCG/ACT inhaler Inhale 2 puffs into the lungs 2 (two) times daily.    [provider]  Cholecalciferol (VITAMIN D3) 5000 units TABS Take 5,000 Units by mouth daily.     [provider]   CRANBERRY PO Take 900 mg by mouth daily.     [provider]  hydroxychloroquine (PLAQUENIL) 200 MG tablet Take 200 mg by mouth 2 (two) times daily. 01/19/20   [provider]  lamoTRIgine (LAMICTAL) 200 MG tablet Take 400 mg by mouth daily.     [provider]  Magnesium Oxide (MAG-OXIDE) 200 MG TABS Take 2.5 tablets (500 mg total) by mouth daily. 07/26/18   Lendon Colonel, NP  metoprolol tartrate (LOPRESSOR) 25 MG tablet TAKE 1/2 TABLET TWICE A DAY 01/03/20   Skeet Latch, MD  Misc Natural Products (OSTEO BI-FLEX ADV JOINT SHIELD PO) Take 1 tablet by mouth daily.    [provider]  Multiple Vitamin (MULTIVITAMIN) capsule Take 1 capsule by mouth daily.    [provider]  nitroGLYCERIN (NITROSTAT) 0.4 MG SL tablet Place 1 tablet (0.4 mg total) under the tongue every 5 (five) minutes as needed for chest pain. 05/13/19 12/02/19  Deberah Pelton, NP  pantoprazole (PROTONIX) 40 MG tablet Take 1 tablet (40 mg total) by mouth daily. 04/04/19   Kennith Gain, MD  Probiotic Product (PROBIOTIC DAILY PO) Take 1 tablet by mouth daily.    [provider]  QUERCETIN PO Take by mouth.    [provider]  rosuvastatin (CRESTOR) 5 MG tablet Take 1 tablet (5 mg total) by mouth daily. 04/07/19 01/03/20  Skeet Latch, MD  zinc gluconate 50 MG tablet Take 1 tablet (50 mg total) by mouth daily. 07/26/18   Lendon Colonel, NP    Allergies    Atorvastatin  Review of Systems   Review of Systems  Musculoskeletal:       Calf pain  All other systems reviewed and are negative.   Physical Exam Updated Vital Signs BP 130/71 (BP Location: Left Arm)   Pulse (!) 56   Temp 98.8 F (37.1 C) (Oral)   Resp 18   Ht 6' (1.829 m)   Wt 78.4 kg   SpO2 97%   BMI 23.45 kg/m   Physical Exam Vitals and nursing note reviewed.  Constitutional:      Appearance: He is well-developed.  HENT:     Head: Normocephalic and atraumatic.  Eyes:      Conjunctiva/sclera: Conjunctivae normal.     Pupils: Pupils are equal, round, and reactive to light.  Cardiovascular:     Rate and Rhythm: Normal rate and regular rhythm.     Heart sounds: Normal heart sounds.  Pulmonary:     Effort: Pulmonary effort is normal.     Breath sounds: Normal breath sounds.  Abdominal:     General: Bowel sounds are normal.     Palpations: Abdomen is soft.  Musculoskeletal:        General: Normal range of motion.  Cervical back: Normal range of motion.     Comments: Left leg without significant calf asymmetry when compared with right, mild tenderness along posterior proximal calf, no palpable cord, overlying erythema, or warmth to touch, no tissue crepitus, leg is NVI, good distal sensation  Skin:    General: Skin is warm and dry.  Neurological:     Mental Status: He is alert and oriented to person, place, and time.     ED Results / Procedures / Treatments   Labs (all labs ordered are listed, but only abnormal results are displayed) Labs Reviewed  BASIC METABOLIC PANEL - Abnormal; Notable for the following components:      Result Value   Glucose, Bld 120 (*)    GFR, Estimated 59 (*)    All other components within normal limits  CBC - Abnormal; Notable for the following components:   Platelets 129 (*)    All other components within normal limits  CBG MONITORING, ED - Abnormal; Notable for the following components:   Glucose-Capillary 109 (*)    All other components within normal limits  URINALYSIS, ROUTINE W REFLEX MICROSCOPIC    EKG None  Radiology No results found.  Procedures Procedures (including critical care time)  Medications Ordered in ED Medications - No data to display  ED Course  I have reviewed the triage vital signs and the nursing notes.  Pertinent labs & imaging results that were available during my care of the patient were reviewed by me and considered in my medical decision making (see chart for details).     MDM Rules/Calculators/A&P  69 y.o. M here with left calf pain x 3-4 days.  No known injury, trauma, or falls.  He is afebrile, non-toxic.  No significant calf asymmetry, tenderness, or palpable cords.  No overlying erythema, induration, findings of cellulitis, or tissue crepitus.  Leg is NVI.  Labs reassuring.  Concern for possible DVT.  No chest pain, SOB, tachycardia, or hypoxia to suggest PE.  Unfortunately, venous US not available at this hour so will schedule this to be done in the AM.  Discussed risks vs benefits of prophylactic lovenox injection tonight, patient prefers to wait until his ultrasound.  He will return in the morning to have US done, if any acute findings will be directed to ER provider.  He can follow-up with PCP.  Return here for any new/acute changes.  Final Clinical Impression(s) / ED Diagnoses Final diagnoses:  Pain of left calf    Rx / DC Orders ED Discharge Orders         Ordered    LE VENOUS        02/05/20 2255           Larene Pickett, PA-C 02/05/20 4401    Dorie Rank, MD 02/06/20 1024

## 2020-02-05 NOTE — ED Triage Notes (Addendum)
Pt reports L calf pain that started 3 days ago. States that nothing makes it better or worse. Denies heat or redness. States that it radiates to his groin. Reports some intermittent dizziness. Denies SOB. No recent car trips or flights.

## 2020-02-05 NOTE — Discharge Instructions (Addendum)
Lab work today was normal. We have you set up for vascular ultrasound of your leg tomorrow.  If any abnormal findings, one of the ER providers will take care of any additional orders/medications. Follow-up with your primary care doctor. Return here for any new/acute changes.

## 2020-02-06 ENCOUNTER — Ambulatory Visit (HOSPITAL_COMMUNITY)
Admission: RE | Admit: 2020-02-06 | Discharge: 2020-02-06 | Disposition: A | Discharge status: Home / Self Care | Payer: Medicare Other | Source: Ambulatory Visit | Attending: Emergency Medicine | Admitting: Emergency Medicine

## 2020-02-06 DIAGNOSIS — M79662 Pain in left lower leg: Secondary | ICD-10-CM

## 2020-02-06 NOTE — Progress Notes (Signed)
VASCULAR LAB    Left lower extremity venous duplex has been performed.  See CV proc for preliminary results.   Juliya Magill, RVT 02/06/2020, 11:49 AM

## 2020-02-07 ENCOUNTER — Ambulatory Visit (INDEPENDENT_AMBULATORY_CARE_PROVIDER_SITE_OTHER): Payer: Medicare Other

## 2020-02-07 DIAGNOSIS — J309 Allergic rhinitis, unspecified: Secondary | ICD-10-CM | POA: Diagnosis not present

## 2020-02-10 ENCOUNTER — Other Ambulatory Visit: Payer: Self-pay | Admitting: Physician Assistant

## 2020-02-10 ENCOUNTER — Other Ambulatory Visit: Payer: Self-pay | Admitting: Radiology

## 2020-02-10 DIAGNOSIS — R1084 Generalized abdominal pain: Secondary | ICD-10-CM

## 2020-02-10 DIAGNOSIS — K7460 Unspecified cirrhosis of liver: Secondary | ICD-10-CM

## 2020-02-10 DIAGNOSIS — N289 Disorder of kidney and ureter, unspecified: Secondary | ICD-10-CM

## 2020-02-20 DIAGNOSIS — Z1211 Encounter for screening for malignant neoplasm of colon: Secondary | ICD-10-CM | POA: Diagnosis not present

## 2020-02-21 ENCOUNTER — Other Ambulatory Visit: Payer: Self-pay

## 2020-02-21 ENCOUNTER — Ambulatory Visit (INDEPENDENT_AMBULATORY_CARE_PROVIDER_SITE_OTHER): Payer: Medicare Other | Admitting: Allergy and Immunology

## 2020-02-21 ENCOUNTER — Encounter: Payer: Self-pay | Admitting: Allergy and Immunology

## 2020-02-21 VITALS — BP 110/62 | HR 61 | Temp 98.6°F | Resp 16

## 2020-02-21 DIAGNOSIS — J3089 Other allergic rhinitis: Secondary | ICD-10-CM

## 2020-02-21 DIAGNOSIS — I2510 Atherosclerotic heart disease of native coronary artery without angina pectoris: Secondary | ICD-10-CM | POA: Diagnosis not present

## 2020-02-21 DIAGNOSIS — K219 Gastro-esophageal reflux disease without esophagitis: Secondary | ICD-10-CM

## 2020-02-21 DIAGNOSIS — J4540 Moderate persistent asthma, uncomplicated: Secondary | ICD-10-CM | POA: Diagnosis not present

## 2020-02-21 DIAGNOSIS — R972 Elevated prostate specific antigen [PSA]: Secondary | ICD-10-CM | POA: Diagnosis not present

## 2020-02-21 MED ORDER — BUDESONIDE 32 MCG/ACT NA SUSP: NASAL | 5 refills | Status: DC

## 2020-02-21 NOTE — Progress Notes (Signed)
[facility]   Follow-up Note  Referring Provider: Leighton Ruff, MD Primary Provider: Leighton Ruff, MD Date of Office Visit: 02/21/2020  Subjective:   Arthur Moore (DOB: May 23, 1950) is a 69 y.o. male who returns to the [facility] on 02/21/2020 in re-evaluation of the following:  HPI: Arthur Moore returns to this clinic in evaluation of asthma and allergic rhinoconjunctivitis and LPR.  I last saw him in this clinic on 24 May 2019.  He had an excellent interval of time regarding his respiratory tract without the need for systemic steroid or antibiotic and rare use of a short acting bronchodilator while he continues on Symbicort on a consistent basis and occasionally some nasal budesonide.  Overall he has had very little symptoms revolving around his lung or his nose.  He is very satisfied with the response he has received with this form of therapy.  His reflux is under excellent control using Protonix.  He has undergone 5 years of immunotherapy which has resulted in rather significant improvement regarding all of his airway issues especially his seasonal flares.  He refuses to receive the Covid vaccine or flu vaccine.  Allergies as of 02/21/2020      Reactions   Atorvastatin Other (See Comments)   Calf pain.      Medication List      albuterol 108 (90 Base) MCG/ACT inhaler Commonly known as: Proventil HFA Inhale 2 puffs into the lungs every 4 (four) hours as needed for wheezing or shortness of breath.   aspirin EC 81 MG tablet Take 81 mg by mouth daily.   budesonide-formoterol 160-4.5 MCG/ACT inhaler Commonly known as: SYMBICORT Inhale 2 puffs into the lungs 2 (two) times daily.   CRANBERRY PO Take 900 mg by mouth daily.   lamoTRIgine 200 MG tablet Commonly known as: LAMICTAL Take 400 mg by mouth daily.   Magnesium Oxide 200 MG Tabs Commonly known as: Mag-Oxide Take 2.5 tablets (500 mg  total) by mouth daily. What changed: how much to take   metoprolol tartrate 25 MG tablet Commonly known as: LOPRESSOR TAKE 1/2 TABLET TWICE A DAY   multivitamin capsule Take 1 capsule by mouth daily.   nitroGLYCERIN 0.4 MG SL tablet Commonly known as: NITROSTAT Place 1 tablet (0.4 mg total) under the tongue every 5 (five) minutes as needed for chest pain.   OSTEO BI-FLEX ADV JOINT SHIELD PO Take 1 tablet by mouth daily.   pantoprazole 40 MG tablet Commonly known as: PROTONIX Take 1 tablet (40 mg total) by mouth daily.   PROBIOTIC DAILY PO Take 1 tablet by mouth daily.   QUERCETIN PO Take by mouth.   rosuvastatin 5 MG tablet Commonly known as: CRESTOR Take 1 tablet (5 mg total) by mouth daily.   vitamin B-6 250 MG tablet Take 250 mg by mouth daily.   vitamin C 250 MG tablet Commonly known as: ASCORBIC ACID Take 250 mg by mouth daily.   Vitamin D3 125 MCG (5000 UT) Tabs Take 5,000 Units by mouth daily.   zinc gluconate 50 MG tablet Take 1 tablet (50 mg total) by mouth daily.   ZYRTEC ALLERGY PO Take 1 tablet by mouth daily.       Past Medical History:  Diagnosis Date  . Aortic insufficiency   . Arthritis   . Asthma   . Cervical disc disease   . Depression   . Diastolic dysfunction 08/19/5359  . Hyperlipidemia 08/24/2015  . Hypertension   .  Lumbar disc disease   . Psoriasis     Past Surgical History:  Procedure Laterality Date  . CHOLECYSTECTOMY  2012  . HERNIA REPAIR    . LEFT HEART CATH AND CORONARY ANGIOGRAPHY N/A 07/08/2018   Procedure: LEFT HEART CATH AND CORONARY ANGIOGRAPHY;  Surgeon: Burnell Blanks, MD;  Location: [facility];  Service: Cardiovascular;  Laterality: N/A;  . PAROTIDECTOMY Right 1992  . PROSTATE SURGERY  2002, 2004  . SINOSCOPY    . SINUS EXPLORATION  2013  . SPINE SURGERY  2013   L C7-T1 laminectomy and discectomy  . TONSILLECTOMY      Review of systems negative except as noted in HPI / PMHx or noted  below:  Review of Systems  Constitutional: Negative.   HENT: Negative.   Eyes: Negative.   Respiratory: Negative.   Cardiovascular: Negative.   Gastrointestinal: Negative.   Genitourinary: Negative.   Musculoskeletal: Negative.   Skin: Negative.   Neurological: Negative.   Endo/Heme/Allergies: Negative.   Psychiatric/Behavioral: Negative.      Objective:   Vitals:   02/21/20 1536  BP: 110/62  Pulse: 61  Resp: 16  Temp: 98.6 F (37 C)  SpO2: 96%          Physical Exam Constitutional:      Appearance: He is not diaphoretic.  HENT:     Head: Normocephalic.     Right Ear: Tympanic membrane, ear canal and external ear normal.     Left Ear: Tympanic membrane, ear canal and external ear normal.     Nose: Nose normal. No mucosal edema or rhinorrhea.     Mouth/Throat:     Pharynx: Uvula midline. No oropharyngeal exudate.  Eyes:     Conjunctiva/sclera: Conjunctivae normal.  Neck:     Thyroid: No thyromegaly.     Trachea: Trachea normal. No tracheal tenderness or tracheal deviation.  Cardiovascular:     Rate and Rhythm: Normal rate and regular rhythm.     Heart sounds: Normal heart sounds, S1 normal and S2 normal. No murmur heard.   Pulmonary:     Effort: No respiratory distress.     Breath sounds: Normal breath sounds. No stridor. No wheezing or rales.  Lymphadenopathy:     Head:     Right side of head: No tonsillar adenopathy.     Left side of head: No tonsillar adenopathy.     Cervical: No cervical adenopathy.  Skin:    Findings: No erythema or rash.     Nails: There is no clubbing.  Neurological:     Mental Status: He is alert.     Diagnostics:    Spirometry was performed and demonstrated an FEV1 of 3.0 at 84 % of predicted.  Assessment and Plan:   1. Asthma, moderate persistent, well-controlled   2. Other allergic rhinitis   3. LPRD (laryngopharyngeal reflux disease)     1. Continue Symbicort 160 - 2 inhalations 2 times a day  2. Continue  nasal budesonide 1-2 sprays each nostril one time per day   3. Continue Protonix 40 mg 1 time per day  4. Continue the following if needed:   A. Zyrtec  B. Albuterol HFA  5. Return to clinic in 6 months or earlier if problem  Arthur Moore appears to be doing very well on his current plan and we will keep him on a collection of anti-inflammatory agents for his airway and therapy directed at reflux.  He can discontinue his immunotherapy as he has  undergone 5 years of this form of treatment and has probably received maximal benefit at this point.  I did have a talk with him today about receiving the Covid vaccine and flu vaccine but that is just not going to happen at this point.  I will see him back in his clinic in 6 months or earlier if there is a problem.  Allena Katz, MD Allergy / Immunology [place] [facility]

## 2020-02-21 NOTE — Patient Instructions (Signed)
  1. Continue Symbicort 160 - 2 inhalations 2 times a day  2. Continue nasal budesonide 1-2 sprays each nostril one time per day   3. Continue Protonix 40 mg 1 time per day  4. Continue the following if needed:   A. Zyrtec  B. Albuterol HFA  5. Return to clinic in 6 months or earlier if problem

## 2020-02-22 ENCOUNTER — Encounter: Payer: Self-pay | Admitting: Allergy and Immunology

## 2020-02-24 ENCOUNTER — Other Ambulatory Visit: Payer: Self-pay

## 2020-02-24 ENCOUNTER — Encounter (HOSPITAL_BASED_OUTPATIENT_CLINIC_OR_DEPARTMENT_OTHER): Payer: Self-pay | Admitting: *Deleted

## 2020-02-24 ENCOUNTER — Emergency Department (HOSPITAL_BASED_OUTPATIENT_CLINIC_OR_DEPARTMENT_OTHER): Payer: Medicare Other

## 2020-02-24 ENCOUNTER — Emergency Department (HOSPITAL_BASED_OUTPATIENT_CLINIC_OR_DEPARTMENT_OTHER)
Admission: EM | Admit: 2020-02-24 | Discharge: 2020-02-24 | Disposition: A | Discharge status: Home / Self Care | Payer: Medicare Other | Attending: Emergency Medicine | Admitting: Emergency Medicine

## 2020-02-24 DIAGNOSIS — I10 Essential (primary) hypertension: Secondary | ICD-10-CM | POA: Insufficient documentation

## 2020-02-24 DIAGNOSIS — R42 Dizziness and giddiness: Secondary | ICD-10-CM | POA: Diagnosis not present

## 2020-02-24 DIAGNOSIS — R519 Headache, unspecified: Secondary | ICD-10-CM | POA: Diagnosis not present

## 2020-02-24 DIAGNOSIS — J45909 Unspecified asthma, uncomplicated: Secondary | ICD-10-CM | POA: Insufficient documentation

## 2020-02-24 DIAGNOSIS — G44311 Acute post-traumatic headache, intractable: Secondary | ICD-10-CM | POA: Diagnosis not present

## 2020-02-24 DIAGNOSIS — M47812 Spondylosis without myelopathy or radiculopathy, cervical region: Secondary | ICD-10-CM | POA: Diagnosis not present

## 2020-02-24 DIAGNOSIS — S060X0A Concussion without loss of consciousness, initial encounter: Secondary | ICD-10-CM | POA: Diagnosis not present

## 2020-02-24 DIAGNOSIS — J3489 Other specified disorders of nose and nasal sinuses: Secondary | ICD-10-CM | POA: Diagnosis not present

## 2020-02-24 DIAGNOSIS — W228XXA Striking against or struck by other objects, initial encounter: Secondary | ICD-10-CM | POA: Insufficient documentation

## 2020-02-24 DIAGNOSIS — S0990XA Unspecified injury of head, initial encounter: Secondary | ICD-10-CM | POA: Diagnosis not present

## 2020-02-24 DIAGNOSIS — S199XXA Unspecified injury of neck, initial encounter: Secondary | ICD-10-CM | POA: Diagnosis not present

## 2020-02-24 DIAGNOSIS — M4802 Spinal stenosis, cervical region: Secondary | ICD-10-CM | POA: Diagnosis not present

## 2020-02-24 DIAGNOSIS — J341 Cyst and mucocele of nose and nasal sinus: Secondary | ICD-10-CM | POA: Diagnosis not present

## 2020-02-24 MED ORDER — PROCHLORPERAZINE MALEATE 10 MG PO TABS: 10.0000 mg | ORAL_TABLET | Freq: Two times a day (BID) | ORAL | 0 refills | Status: DC | PRN

## 2020-02-24 MED ORDER — DIPHENHYDRAMINE HCL 50 MG/ML IJ SOLN
25.0000 mg | Freq: Once | INTRAMUSCULAR | Status: AC
  Administered 2020-02-24: 25 mg via INTRAVENOUS
  Filled 2020-02-24: qty 1

## 2020-02-24 MED ORDER — PROCHLORPERAZINE EDISYLATE 10 MG/2ML IJ SOLN
10.0000 mg | Freq: Once | INTRAMUSCULAR | Status: AC
  Administered 2020-02-24: 10 mg via INTRAVENOUS
  Filled 2020-02-24: qty 2
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
## 2020-02-24 NOTE — ED Notes (Signed)
Has nausea and is photosensitive as well.
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
## 2020-02-24 NOTE — Discharge Instructions (Addendum)
You were evaluated in the Emergency Department and after careful evaluation, we did not find any emergent condition requiring admission or further testing in the hospital.  Your exam/testing today is overall reassuring.  Symptoms seem to be due to a posttraumatic headache.  This could be due to a concussion or the trauma could have triggered a migraine.  We are glad to see that the medications provided here in the emergency department relieve your headache.  Your CT imaging did not show any emergencies.  We recommend continued mental and physical rest until tomorrow and at that time if you are feeling back to normal, you can slowly increase to normal daily activities.  Please return to the Emergency Department if you experience any worsening of your condition.   Thank you for allowing [place] to be a part of your care.

## 2020-02-24 NOTE — ED Notes (Signed)
Has small dark red area at occipital area

## 2020-02-24 NOTE — ED Notes (Signed)
Pt is now requesting pain med for HA, states he took a 1000mg  Tylenol PO at approx 1030hrs, then 400mg  Ibuprofen PO at 1030hrs as well. Will consult EDP

## 2020-02-27 ENCOUNTER — Telehealth: Payer: Self-pay

## 2020-02-27 NOTE — Telephone Encounter (Signed)
Left message for patient to call back to schedule in concussion clinic.

## 2020-02-28 ENCOUNTER — Other Ambulatory Visit: Payer: Self-pay

## 2020-02-28 ENCOUNTER — Ambulatory Visit
Admission: RE | Admit: 2020-02-28 | Discharge: 2020-02-28 | Disposition: A | Discharge status: Home / Self Care | Payer: Medicare Other | Source: Ambulatory Visit | Attending: Physician Assistant | Admitting: Physician Assistant

## 2020-02-28 DIAGNOSIS — R300 Dysuria: Secondary | ICD-10-CM | POA: Diagnosis not present

## 2020-02-28 DIAGNOSIS — K766 Portal hypertension: Secondary | ICD-10-CM | POA: Diagnosis not present

## 2020-02-28 DIAGNOSIS — I864 Gastric varices: Secondary | ICD-10-CM | POA: Diagnosis not present

## 2020-02-28 DIAGNOSIS — Z125 Encounter for screening for malignant neoplasm of prostate: Secondary | ICD-10-CM | POA: Diagnosis not present

## 2020-02-28 DIAGNOSIS — N5201 Erectile dysfunction due to arterial insufficiency: Secondary | ICD-10-CM | POA: Diagnosis not present

## 2020-02-28 DIAGNOSIS — K7460 Unspecified cirrhosis of liver: Secondary | ICD-10-CM | POA: Diagnosis not present

## 2020-02-28 DIAGNOSIS — R1084 Generalized abdominal pain: Secondary | ICD-10-CM

## 2020-02-28 DIAGNOSIS — I8510 Secondary esophageal varices without bleeding: Secondary | ICD-10-CM | POA: Diagnosis not present

## 2020-02-28 DIAGNOSIS — N281 Cyst of kidney, acquired: Secondary | ICD-10-CM | POA: Diagnosis not present

## 2020-02-28 DIAGNOSIS — N289 Disorder of kidney and ureter, unspecified: Secondary | ICD-10-CM

## 2020-02-28 DIAGNOSIS — R3915 Urgency of urination: Secondary | ICD-10-CM | POA: Diagnosis not present

## 2020-02-28 MED ORDER — IOPAMIDOL (ISOVUE-300) INJECTION 61%
100.0000 mL | Freq: Once | INTRAVENOUS | Status: AC | PRN
  Administered 2020-02-28: 100 mL via INTRAVENOUS

## 2020-03-05 DIAGNOSIS — K589 Irritable bowel syndrome without diarrhea: Secondary | ICD-10-CM | POA: Diagnosis not present

## 2020-03-05 DIAGNOSIS — Z86010 Personal history of colonic polyps: Secondary | ICD-10-CM | POA: Diagnosis not present

## 2020-03-05 DIAGNOSIS — R109 Unspecified abdominal pain: Secondary | ICD-10-CM | POA: Diagnosis not present

## 2020-03-05 DIAGNOSIS — K862 Cyst of pancreas: Secondary | ICD-10-CM | POA: Diagnosis not present

## 2020-03-05 DIAGNOSIS — K3189 Other diseases of stomach and duodenum: Secondary | ICD-10-CM | POA: Diagnosis not present

## 2020-03-05 DIAGNOSIS — Z800 Family history of malignant neoplasm of digestive organs: Secondary | ICD-10-CM | POA: Diagnosis not present

## 2020-03-05 DIAGNOSIS — K7460 Unspecified cirrhosis of liver: Secondary | ICD-10-CM | POA: Diagnosis not present

## 2020-03-05 DIAGNOSIS — R911 Solitary pulmonary nodule: Secondary | ICD-10-CM | POA: Diagnosis not present

## 2020-03-07 ENCOUNTER — Ambulatory Visit (INDEPENDENT_AMBULATORY_CARE_PROVIDER_SITE_OTHER): Payer: Medicare Other | Admitting: Psychologist

## 2020-03-07 DIAGNOSIS — F4310 Post-traumatic stress disorder, unspecified: Secondary | ICD-10-CM | POA: Diagnosis not present

## 2020-03-13 ENCOUNTER — Ambulatory Visit (INDEPENDENT_AMBULATORY_CARE_PROVIDER_SITE_OTHER): Payer: Medicare Other

## 2020-03-13 DIAGNOSIS — J309 Allergic rhinitis, unspecified: Secondary | ICD-10-CM | POA: Diagnosis not present

## 2020-03-13 DIAGNOSIS — R300 Dysuria: Secondary | ICD-10-CM | POA: Diagnosis not present

## 2020-03-13 DIAGNOSIS — R3915 Urgency of urination: Secondary | ICD-10-CM | POA: Diagnosis not present

## 2020-03-16 ENCOUNTER — Ambulatory Visit (INDEPENDENT_AMBULATORY_CARE_PROVIDER_SITE_OTHER): Payer: Medicare Other | Admitting: Psychology

## 2020-03-16 DIAGNOSIS — F4310 Post-traumatic stress disorder, unspecified: Secondary | ICD-10-CM

## 2020-03-20 ENCOUNTER — Other Ambulatory Visit: Payer: Self-pay | Admitting: Family Medicine

## 2020-03-20 DIAGNOSIS — R911 Solitary pulmonary nodule: Secondary | ICD-10-CM

## 2020-03-21 ENCOUNTER — Ambulatory Visit (INDEPENDENT_AMBULATORY_CARE_PROVIDER_SITE_OTHER): Payer: Medicare Other | Admitting: Psychology

## 2020-03-21 DIAGNOSIS — F430 Acute stress reaction: Secondary | ICD-10-CM

## 2020-03-23 ENCOUNTER — Ambulatory Visit: Payer: Medicare Other | Admitting: Psychology

## 2020-03-27 ENCOUNTER — Ambulatory Visit (INDEPENDENT_AMBULATORY_CARE_PROVIDER_SITE_OTHER): Payer: Medicare Other | Admitting: Psychologist

## 2020-03-27 DIAGNOSIS — K219 Gastro-esophageal reflux disease without esophagitis: Secondary | ICD-10-CM | POA: Diagnosis not present

## 2020-03-27 DIAGNOSIS — I10 Essential (primary) hypertension: Secondary | ICD-10-CM | POA: Diagnosis not present

## 2020-03-27 DIAGNOSIS — F4310 Post-traumatic stress disorder, unspecified: Secondary | ICD-10-CM

## 2020-03-27 DIAGNOSIS — N400 Enlarged prostate without lower urinary tract symptoms: Secondary | ICD-10-CM | POA: Diagnosis not present

## 2020-03-27 DIAGNOSIS — J4540 Moderate persistent asthma, uncomplicated: Secondary | ICD-10-CM | POA: Diagnosis not present

## 2020-03-27 DIAGNOSIS — I2510 Atherosclerotic heart disease of native coronary artery without angina pectoris: Secondary | ICD-10-CM | POA: Diagnosis not present

## 2020-03-27 DIAGNOSIS — G4700 Insomnia, unspecified: Secondary | ICD-10-CM | POA: Diagnosis not present

## 2020-03-27 DIAGNOSIS — E7800 Pure hypercholesterolemia, unspecified: Secondary | ICD-10-CM | POA: Diagnosis not present

## 2020-03-28 DIAGNOSIS — M542 Cervicalgia: Secondary | ICD-10-CM | POA: Diagnosis not present

## 2020-03-29 ENCOUNTER — Ambulatory Visit (INDEPENDENT_AMBULATORY_CARE_PROVIDER_SITE_OTHER): Payer: Medicare Other | Admitting: Psychology

## 2020-03-29 DIAGNOSIS — F4310 Post-traumatic stress disorder, unspecified: Secondary | ICD-10-CM | POA: Diagnosis not present

## 2020-04-04 ENCOUNTER — Ambulatory Visit (INDEPENDENT_AMBULATORY_CARE_PROVIDER_SITE_OTHER): Payer: Medicare Other | Admitting: Psychology

## 2020-04-04 DIAGNOSIS — F4310 Post-traumatic stress disorder, unspecified: Secondary | ICD-10-CM

## 2020-04-06 DIAGNOSIS — R300 Dysuria: Secondary | ICD-10-CM | POA: Diagnosis not present

## 2020-04-06 DIAGNOSIS — N281 Cyst of kidney, acquired: Secondary | ICD-10-CM | POA: Diagnosis not present

## 2020-04-11 ENCOUNTER — Other Ambulatory Visit: Payer: Self-pay

## 2020-04-11 ENCOUNTER — Ambulatory Visit
Admission: RE | Admit: 2020-04-11 | Discharge: 2020-04-11 | Disposition: A | Discharge status: Home / Self Care | Payer: Medicare Other | Source: Ambulatory Visit | Attending: Family Medicine | Admitting: Family Medicine

## 2020-04-11 DIAGNOSIS — R911 Solitary pulmonary nodule: Secondary | ICD-10-CM

## 2020-04-11 DIAGNOSIS — I700 Atherosclerosis of aorta: Secondary | ICD-10-CM | POA: Diagnosis not present

## 2020-04-11 DIAGNOSIS — M47814 Spondylosis without myelopathy or radiculopathy, thoracic region: Secondary | ICD-10-CM | POA: Diagnosis not present

## 2020-04-11 DIAGNOSIS — I2510 Atherosclerotic heart disease of native coronary artery without angina pectoris: Secondary | ICD-10-CM | POA: Diagnosis not present

## 2020-04-11 DIAGNOSIS — J439 Emphysema, unspecified: Secondary | ICD-10-CM | POA: Diagnosis not present

## 2020-04-12 ENCOUNTER — Ambulatory Visit (INDEPENDENT_AMBULATORY_CARE_PROVIDER_SITE_OTHER): Payer: Medicare Other | Admitting: Psychology

## 2020-04-12 DIAGNOSIS — F4310 Post-traumatic stress disorder, unspecified: Secondary | ICD-10-CM | POA: Diagnosis not present

## 2020-04-13 DIAGNOSIS — R109 Unspecified abdominal pain: Secondary | ICD-10-CM | POA: Diagnosis not present

## 2020-04-17 ENCOUNTER — Ambulatory Visit (INDEPENDENT_AMBULATORY_CARE_PROVIDER_SITE_OTHER): Payer: Medicare Other | Admitting: *Deleted

## 2020-04-17 ENCOUNTER — Ambulatory Visit: Payer: Medicare Other | Admitting: Psychologist

## 2020-04-17 DIAGNOSIS — J309 Allergic rhinitis, unspecified: Secondary | ICD-10-CM | POA: Diagnosis not present

## 2020-04-18 ENCOUNTER — Ambulatory Visit (INDEPENDENT_AMBULATORY_CARE_PROVIDER_SITE_OTHER): Payer: Medicare Other | Admitting: Psychology

## 2020-04-18 DIAGNOSIS — F4310 Post-traumatic stress disorder, unspecified: Secondary | ICD-10-CM

## 2020-04-25 ENCOUNTER — Ambulatory Visit (INDEPENDENT_AMBULATORY_CARE_PROVIDER_SITE_OTHER): Payer: Medicare Other | Admitting: Psychology

## 2020-04-25 DIAGNOSIS — F4310 Post-traumatic stress disorder, unspecified: Secondary | ICD-10-CM

## 2020-05-02 ENCOUNTER — Ambulatory Visit (INDEPENDENT_AMBULATORY_CARE_PROVIDER_SITE_OTHER): Payer: Medicare Other | Admitting: Psychology

## 2020-05-02 DIAGNOSIS — F4310 Post-traumatic stress disorder, unspecified: Secondary | ICD-10-CM | POA: Diagnosis not present

## 2020-05-11 ENCOUNTER — Ambulatory Visit (INDEPENDENT_AMBULATORY_CARE_PROVIDER_SITE_OTHER): Payer: Medicare Other | Admitting: Psychology

## 2020-05-11 DIAGNOSIS — F4310 Post-traumatic stress disorder, unspecified: Secondary | ICD-10-CM | POA: Diagnosis not present

## 2020-05-14 ENCOUNTER — Ambulatory Visit (INDEPENDENT_AMBULATORY_CARE_PROVIDER_SITE_OTHER): Payer: Medicare Other | Admitting: Psychology

## 2020-05-14 DIAGNOSIS — F4310 Post-traumatic stress disorder, unspecified: Secondary | ICD-10-CM | POA: Diagnosis not present

## 2020-05-15 ENCOUNTER — Ambulatory Visit (INDEPENDENT_AMBULATORY_CARE_PROVIDER_SITE_OTHER): Payer: Medicare Other

## 2020-05-15 DIAGNOSIS — J309 Allergic rhinitis, unspecified: Secondary | ICD-10-CM | POA: Diagnosis not present

## 2020-05-15 NOTE — Progress Notes (Signed)
VIALS EXP 05-15-21 

## 2020-05-16 DIAGNOSIS — J3089 Other allergic rhinitis: Secondary | ICD-10-CM | POA: Diagnosis not present

## 2020-05-17 DIAGNOSIS — J301 Allergic rhinitis due to pollen: Secondary | ICD-10-CM | POA: Diagnosis not present

## 2020-05-18 DIAGNOSIS — J3081 Allergic rhinitis due to animal (cat) (dog) hair and dander: Secondary | ICD-10-CM | POA: Diagnosis not present

## 2020-05-19 ENCOUNTER — Encounter: Payer: Self-pay | Admitting: *Deleted

## 2020-05-24 ENCOUNTER — Ambulatory Visit (INDEPENDENT_AMBULATORY_CARE_PROVIDER_SITE_OTHER): Payer: Medicare Other | Admitting: Psychology

## 2020-05-24 DIAGNOSIS — F4310 Post-traumatic stress disorder, unspecified: Secondary | ICD-10-CM | POA: Diagnosis not present

## 2020-05-24 NOTE — Patient Instructions (Addendum)
Pruritus Your skin testing to foods today was: negative with a good histamine control Continue Zyrtec 10 mg once a day. Will hold off on twice a day dosing due to your history of non-alcoholic cirrhosis Consider TRUE patch test. The patch will be placed on a Monday and you will have readings on a Wednesday and a Friday.  Continue clobetasol as prescribed  Moderate persistent asthma Continue Symbicort 160/4.5 mcg 2 puffs twice a day with spacer to help prevent cough and wheeze May use albuterol 2 puffs every 4 hours as needed for cough, wheeze, tightness in chest, shortness of breath Asthma control goals:   Full participation in all desired activities (may need albuterol before activity)  Albuterol use two time or less a week on average (not counting use with activity)  Cough interfering with sleep two time or less a month  Oral steroids no more than once a year  No hospitalizations  Allergic rhinitis Skin testing today [id number] to grass pollen, weed mix pollen, tree pollen, and cat  Continue allergy injections per protocol Continue budesonide nasal spray 1 to 2 sprays each nostril once a day as needed for stuffy nose Continue Zyrtec 10 mg once a day as needed for runny nose or itching  Laryngeopharyngeal reflux disease Continue Protonix 40 mg once a day  Please let us know if this treatment plan is not working well for you Schedule an appointment for patch testing

## 2020-05-25 ENCOUNTER — Encounter: Payer: Self-pay | Admitting: Family

## 2020-05-25 ENCOUNTER — Ambulatory Visit (INDEPENDENT_AMBULATORY_CARE_PROVIDER_SITE_OTHER): Payer: Medicare Other | Admitting: Family

## 2020-05-25 ENCOUNTER — Other Ambulatory Visit: Payer: Self-pay

## 2020-05-25 VITALS — HR 59 | Resp 16 | Ht 72.0 in

## 2020-05-25 DIAGNOSIS — J3089 Other allergic rhinitis: Secondary | ICD-10-CM | POA: Diagnosis not present

## 2020-05-25 DIAGNOSIS — L299 Pruritus, unspecified: Secondary | ICD-10-CM | POA: Diagnosis not present

## 2020-05-25 DIAGNOSIS — J4540 Moderate persistent asthma, uncomplicated: Secondary | ICD-10-CM | POA: Diagnosis not present

## 2020-05-25 DIAGNOSIS — K219 Gastro-esophageal reflux disease without esophagitis: Secondary | ICD-10-CM

## 2020-05-25 NOTE — Progress Notes (Signed)
[facility] [place] [address] Dept: [phone]  FOLLOW UP NOTE  Patient ID: Arthur Moore, male    DOB: [birth year]  Age: 70 y.o. MRN: [id number] Date of Office Visit: 05/25/2020  Assessment  Chief Complaint: Allergy Testing (Has been having issues with itchy arms not sure the cause and is here for an updated allergy test /Off of all antihistamines for last  days )  HPI Arthur Moore is a 70 year old male who presents today for skin testing to select foods and environmental inhalants.  He was last seen on February 21, 2020 by Dr. Neldon Mc for moderate persistent asthma, allergic rhinitis, and laryngopharyngeal reflux disease.  He reports that for a good year he has had itching on the dorsal aspects of both lower arms.  He was given clobetasol by Dr. Nelva Bush in the past when this first started and this worked well for a while.  He also has been using Eucerin and also applying cold water and this helps.  The itching occurs almost daily and will last for a couple of hours.  He is wondering if food may be causing this itching.  He reports that when he has the itching he does not see a rash, but the area will be red.  He denies any concomitant cardiorespiratory or gastrointestinal symptoms.  Allergic rhinitis is reported as moderately controlled with nasal budesonide 1 to 2 sprays each nostril once a day and Zyrtec 10 mg once a day as needed.  He also continues with immunotherapy which he feels is beneficial.  He is interested in getting the skin tested to environmental inhalants to see what he is allergic to at this point in time.  He has been on immunotherapy for the past 5 years.  He is not interested in stopping allergy injections at this time.  He denies large local reactions.  He reports clear rhinorrhea, postnasal drip, and nasal congestion.  His reflux is reported as controlled with Protonix.     Drug Allergies:  Allergies  Allergen Reactions  . Atorvastatin Other  (See Comments)    Calf pain.    Review of Systems: Review of Systems  Constitutional: Negative for chills and fever.  HENT:       Reports clear rhinorrhea, postnasal drip, nasal congestion  Eyes:       Occasional itchy watery eyes for which Systane eyedrops help  Respiratory: Positive for cough. Negative for shortness of breath and wheezing.        Ports a little dry cough due to postnasal drip and being off antihistamines  Cardiovascular: Negative for chest pain and palpitations.  Gastrointestinal: Negative for heartburn.       Reports history of IBS  Genitourinary: Positive for dysuria.       Ports painful urination due to" prostate issues"  Skin: Positive for itching. Negative for rash.  Neurological: Negative for headaches.  Endo/Heme/Allergies: Positive for environmental allergies.    Physical Exam: Pulse (!) 59   Resp 16   Ht 6' (1.829 m)   SpO2 93%   BMI 23.44 kg/m    Physical Exam Constitutional:      Appearance: Normal appearance.  HENT:     Head: Normocephalic and atraumatic.     Comments: Pharynx normal, eyes normal, ears normal, nose normal    Right Ear: Tympanic membrane, ear canal and external ear normal.     Left Ear: Tympanic membrane, ear canal and external ear normal.     Nose: Nose normal.  Mouth/Throat:     Mouth: Mucous membranes are moist.     Pharynx: Oropharynx is clear.  Eyes:     Conjunctiva/sclera: Conjunctivae normal.  Cardiovascular:     Rate and Rhythm: Regular rhythm.     Heart sounds: Normal heart sounds.  Pulmonary:     Effort: Pulmonary effort is normal.     Breath sounds: Normal breath sounds.     Comments: Lungs clear to auscultation Musculoskeletal:     Cervical back: Neck supple.  Skin:    General: Skin is warm.     Comments:  Small scabbing and excoriation marks noted on bilateral dorsal aspects of arm  Neurological:     Mental Status: He is alert and oriented to person, place, and time.  Psychiatric:        Mood  and Affect: Mood normal.        Behavior: Behavior normal.        Thought Content: Thought content normal.        Judgment: Judgment normal.     Diagnostics: Percutaneous skin testing today was: positive to grass and tree pollen with a good histamine control. All select foods were negative with a good histamine conrol  Intradermal testing today was: positive to weed mix and cat    Assessment and Plan: 1. Pruritus   2. Other allergic rhinitis   3. Asthma, moderate persistent, well-controlled   4. LPRD (laryngopharyngeal reflux disease)     No orders of the defined types were placed in this encounter.   Patient Instructions  Pruritus Your skin testing to foods today was: negative with a good histamine control Continue Zyrtec 10 mg once a day. Will hold off on twice a day dosing due to your history of non-alcoholic cirrhosis Consider TRUE patch test. The patch will be placed on a Monday and you will have readings on a Wednesday and a Friday.  Continue clobetasol as prescribed  Moderate persistent asthma Continue Symbicort 160/4.5 mcg 2 puffs twice a day with spacer to help prevent cough and wheeze May use albuterol 2 puffs every 4 hours as needed for cough, wheeze, tightness in chest, shortness of breath Asthma control goals:   Full participation in all desired activities (may need albuterol before activity)  Albuterol use two time or less a week on average (not counting use with activity)  Cough interfering with sleep two time or less a month  Oral steroids no more than once a year  No hospitalizations  Allergic rhinitis Skin testing today [id number] to grass pollen, weed mix pollen, tree pollen, and cat  Continue allergy injections per protocol Continue budesonide nasal spray 1 to 2 sprays each nostril once a day as needed for stuffy nose Continue Zyrtec 10 mg once a day as needed for runny nose or itching  Laryngeopharyngeal reflux disease Continue Protonix 40 mg  once a day  Please let us know if this treatment plan is not working well for you Schedule an appointment for patch testing   Return if symptoms worsen or fail to improve, for patch testing.    Thank you for the opportunity to care for this patient.  Please do not hesitate to contact me with questions.  Althea Charon, FNP [facility]

## 2020-05-28 DIAGNOSIS — K219 Gastro-esophageal reflux disease without esophagitis: Secondary | ICD-10-CM | POA: Diagnosis not present

## 2020-05-28 DIAGNOSIS — I10 Essential (primary) hypertension: Secondary | ICD-10-CM | POA: Diagnosis not present

## 2020-05-28 DIAGNOSIS — E7800 Pure hypercholesterolemia, unspecified: Secondary | ICD-10-CM | POA: Diagnosis not present

## 2020-05-28 DIAGNOSIS — I2510 Atherosclerotic heart disease of native coronary artery without angina pectoris: Secondary | ICD-10-CM | POA: Diagnosis not present

## 2020-05-28 DIAGNOSIS — G4700 Insomnia, unspecified: Secondary | ICD-10-CM | POA: Diagnosis not present

## 2020-05-28 DIAGNOSIS — J4540 Moderate persistent asthma, uncomplicated: Secondary | ICD-10-CM | POA: Diagnosis not present

## 2020-05-28 DIAGNOSIS — N400 Enlarged prostate without lower urinary tract symptoms: Secondary | ICD-10-CM | POA: Diagnosis not present

## 2020-05-29 ENCOUNTER — Other Ambulatory Visit: Payer: Self-pay | Admitting: Gastroenterology

## 2020-05-29 DIAGNOSIS — K7460 Unspecified cirrhosis of liver: Secondary | ICD-10-CM

## 2020-05-29 DIAGNOSIS — K862 Cyst of pancreas: Secondary | ICD-10-CM

## 2020-05-31 ENCOUNTER — Ambulatory Visit (INDEPENDENT_AMBULATORY_CARE_PROVIDER_SITE_OTHER): Payer: Medicare Other | Admitting: Psychology

## 2020-05-31 DIAGNOSIS — F4310 Post-traumatic stress disorder, unspecified: Secondary | ICD-10-CM

## 2020-06-04 ENCOUNTER — Other Ambulatory Visit: Payer: Self-pay

## 2020-06-04 ENCOUNTER — Encounter: Payer: Self-pay | Admitting: Adult Health

## 2020-06-04 ENCOUNTER — Ambulatory Visit (INDEPENDENT_AMBULATORY_CARE_PROVIDER_SITE_OTHER): Payer: Medicare Other | Admitting: Adult Health

## 2020-06-04 VITALS — BP 127/75 | HR 60 | Ht 72.0 in | Wt 183.0 lb

## 2020-06-04 DIAGNOSIS — F39 Unspecified mood [affective] disorder: Secondary | ICD-10-CM | POA: Diagnosis not present

## 2020-06-04 NOTE — Progress Notes (Signed)
[facility] MD/PA/NP Initial Note  06/04/2020 11:18 AM Arthur Moore  MRN:  [id number]  Chief Complaint:   HPI:   Describes mood today as "ok". Pleasant. Mood symptoms - denies depression, anxiety, and irritability. Stating "i'm doing pretty good". Stable interest and motivation. Taking medications as prescribed.  Energy levels stable. Active, does not have a regular exercise routine. Enjoys some usual interests and activities. Married. Lives with wife. 4 children between them. 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 Appetite adequate.  Weight stable - 177 pounds. Sleeps well most nights. Averages 8.5 hours. Focus and concentration stable. Completing tasks. Managing aspects of household. Retired. [place] working.  Denies SI or HI.  Denies AH or VH. DNA research project through the [place].   Started EMDR with Bambi Cottle x 4 weeks.  Previous medication trials: Lexapro, Cymbalta, Xanax, Lamictal, Wellbutrin  OTC - Melatonin  Visit Diagnosis: No diagnosis found.  Past Psychiatric History: Admitted twice to wean off medication.   Past Medical History:  Past Medical History:  Diagnosis Date  . Aortic insufficiency   . Arthritis   . Asthma   . Cervical disc disease   . Depression   . Diastolic dysfunction [id number]  . Hyperlipidemia 08/24/2015  . Hypertension   . Lumbar disc disease   . Psoriasis     Past Surgical History:  Procedure Laterality Date  . CHOLECYSTECTOMY  2012  . HERNIA REPAIR    . LEFT HEART CATH AND CORONARY ANGIOGRAPHY N/A 07/08/2018   Procedure: LEFT HEART CATH AND CORONARY ANGIOGRAPHY;  Surgeon: Burnell Blanks, MD;  Location: [facility];  Service: Cardiovascular;  Laterality: N/A;  . PAROTIDECTOMY Right 1992  . PROSTATE SURGERY  2002, 2004  . SINOSCOPY    . SINUS EXPLORATION  2013  . SPINE SURGERY  2013   L C7-T1 laminectomy and discectomy  . TONSILLECTOMY      Family Psychiatric History: Brother - Bipolar  Family History:  Family  History  Problem Relation Age of Onset  . Colon cancer Mother   . Lung cancer Father   . Cancer Other   . Bipolar disorder Brother   . Allergic rhinitis Neg Hx   . Angioedema Neg Hx   . Asthma Neg Hx   . Eczema Neg Hx     Social History:  Social History   Socioeconomic History  . Marital status: Married    Spouse name: Not on file  . Number of children: Not on file  . Years of education: Not on file  . Highest education level: Not on file  Occupational History  . Not on file  Tobacco Use  . Smoking status: Never Smoker  . Smokeless tobacco: Never Used  Vaping Use  . Vaping Use: Never used  Substance and Sexual Activity  . Alcohol use: Yes    Alcohol/week: 0.0 standard drinks    Comment: occasional beer  . Drug use: No  . Sexual activity: Not on file  Other Topics Concern  . Not on file  Social History Narrative   Right handed   Lives with wife in tow story home   Social Determinants of Health   Financial Resource Strain: Not on file  Food Insecurity: Not on file  Transportation Needs: Not on file  Physical Activity: Not on file  Stress: Not on file  Social Connections: Not on file    Allergies:  Allergies  Allergen Reactions  . Atorvastatin Other (See Comments)    Calf pain.    Metabolic  Disorder Labs: No results found for: HGBA1C, MPG No results found for: PROLACTIN Lab Results  Component Value Date   CHOL 102 03/08/2019   TRIG 94 03/08/2019   HDL 34 (L) 03/08/2019   CHOLHDL 3.0 03/08/2019   VLDL 25 07/09/2018   LDLCALC 50 03/08/2019   [place] 97 07/09/2018   No results found for: TSH  Therapeutic Level Labs: No results found for: LITHIUM No results found for: VALPROATE No components found for:  CBMZ  Current Medications: Current Outpatient Medications  Medication Sig Dispense Refill  . albuterol (PROVENTIL HFA) 108 (90 Base) MCG/ACT inhaler Inhale 2 puffs into the lungs every 4 (four) hours as needed for wheezing or shortness of  breath. 18 g 1  . aspirin EC 81 MG tablet Take 81 mg by mouth daily.    . budesonide (RHINOCORT AQUA) 32 MCG/ACT nasal spray Use two sprays in each nostril once daily 8.43 mL 5  . budesonide-formoterol (SYMBICORT) 160-4.5 MCG/ACT inhaler Inhale 2 puffs into the lungs 2 (two) times daily.    . Cetirizine HCl (ZYRTEC ALLERGY PO) Take 1 tablet by mouth daily.    . Cholecalciferol (VITAMIN D3) 5000 units TABS Take 5,000 Units by mouth daily.     Marland Kitchen CRANBERRY PO Take 900 mg by mouth daily.     Marland Kitchen lamoTRIgine (LAMICTAL) 200 MG tablet Take 400 mg by mouth daily.     . Magnesium Oxide (MAG-OXIDE) 200 MG TABS Take 2.5 tablets (500 mg total) by mouth daily. (Patient taking differently: Take 2 tablets by mouth daily.)  0  . metoprolol tartrate (LOPRESSOR) 25 MG tablet TAKE 1/2 TABLET TWICE A DAY (Patient taking differently: Take 12.5 mg by mouth 2 (two) times daily. TAKE 1/2 TABLET TWICE A DAY) 90 tablet 3  . Misc Natural Products (OSTEO BI-FLEX ADV JOINT SHIELD PO) Take 1 tablet by mouth daily. (Patient not taking: Reported on 05/25/2020)    . Multiple Vitamin (MULTIVITAMIN) capsule Take 1 capsule by mouth daily.    . nitroGLYCERIN (NITROSTAT) 0.4 MG SL tablet Place 1 tablet (0.4 mg total) under the tongue every 5 (five) minutes as needed for chest pain. 25 tablet 3  . pantoprazole (PROTONIX) 40 MG tablet Take 1 tablet (40 mg total) by mouth daily. 90 tablet 1  . Probiotic Product (PROBIOTIC DAILY PO) Take 1 tablet by mouth daily.    . prochlorperazine (COMPAZINE) 10 MG tablet Take 1 tablet (10 mg total) by mouth 2 (two) times daily as needed (Headache/nausea). (Patient not taking: Reported on 05/25/2020) 20 tablet 0  . Pyridoxine HCl (VITAMIN B-6) 250 MG tablet Take 250 mg by mouth daily.    Marland Kitchen QUERCETIN PO Take by mouth.    . rosuvastatin (CRESTOR) 5 MG tablet Take 1 tablet (5 mg total) by mouth daily. 90 tablet 3  . vitamin C (ASCORBIC ACID) 250 MG tablet Take 250 mg by mouth daily.    Marland Kitchen zinc gluconate 50 MG  tablet Take 1 tablet (50 mg total) by mouth daily.     No current facility-administered medications for this visit.    Medication Side Effects: none  Orders placed this visit:  No orders of the defined types were placed in this encounter.   Psychiatric Specialty Exam:  Review of Systems  There were no vitals taken for this visit.There is no height or weight on file to calculate BMI.  General Appearance: Casual, Neat and Well Groomed  Eye Contact:  Good  Speech:  Clear and Coherent and Normal Rate  Volume:  Normal  Mood:  Euthymic  Affect:  Appropriate and Congruent  Thought Process:  Coherent and Descriptions of Associations: Intact  Orientation:  Full (Time, Place, and Person)  Thought Content: Logical   Suicidal Thoughts:  No  Homicidal Thoughts:  No  Memory:  WNL  Judgement:  Good  Insight:  Good  Psychomotor Activity:  Normal  Concentration:  Concentration: Good  Recall:  Good  Fund of Knowledge: Good  Language: Good  Assets:  Communication Skills Desire for Improvement Financial Resources/Insurance Housing Intimacy Leisure Time Physical Health Resilience Social Support Talents/Skills Transportation Vocational/Educational  ADL's:  Intact  Cognition: WNL  Prognosis:  Good   Screenings:  PHQ2-9   Flowsheet Row Nutrition from 06/11/2017 in [facility]  PHQ-2 Total Score 0      Receiving Psychotherapy: Yes - Bambi Cottle  Treatment Plan/Recommendations:   Plan:  PDMP reviewed  1. Lamictal 200mg  twice daily.  Read and reviewed note with patient for accuracy.   RTC 6 months  Patient advised to contact office with any questions, adverse effects, or acute worsening in signs and symptoms. Counseled patient regarding potential benefits, risks, and side effects of Lamictal to include potential risk of Stevens-Johnson syndrome. Advised patient to stop taking Lamictal and contact office immediately if rash develops and to seek  urgent medical attention if rash is severe and/or spreading quickly. Will start Lamictal 25 mg daily for 2 weeks, then increase to 50 mg daily for 2 weeks, then 100 mg daily for 2 weeks, then 150 mg daily for mood symptoms.   Aloha Gell, NP

## 2020-06-05 DIAGNOSIS — R509 Fever, unspecified: Secondary | ICD-10-CM | POA: Diagnosis not present

## 2020-06-05 DIAGNOSIS — R059 Cough, unspecified: Secondary | ICD-10-CM | POA: Diagnosis not present

## 2020-06-05 DIAGNOSIS — Z8709 Personal history of other diseases of the respiratory system: Secondary | ICD-10-CM | POA: Diagnosis not present

## 2020-06-05 DIAGNOSIS — U071 COVID-19: Secondary | ICD-10-CM | POA: Diagnosis not present

## 2020-06-05 DIAGNOSIS — Z20822 Contact with and (suspected) exposure to covid-19: Secondary | ICD-10-CM | POA: Diagnosis not present

## 2020-06-06 ENCOUNTER — Telehealth: Payer: Self-pay | Admitting: Adult Health

## 2020-06-06 ENCOUNTER — Other Ambulatory Visit: Payer: Self-pay | Admitting: Adult Health

## 2020-06-06 NOTE — Telephone Encounter (Signed)
Called to discuss with patient about COVID-19 symptoms and the use of one of the available treatments for those with mild to moderate Covid symptoms and at a high risk of hospitalization.  Pt appears to qualify for outpatient treatment due to co-morbid conditions and/or a member of an at-risk group in accordance with the FDA Emergency Use Authorization.    Symptom onset: unknown Immunocompromised? no Qualifiers: age, CAD, chronic pulmonary disease, HTN  Unable to reach pt - LMOM, my chart message sent   Scot Dock

## 2020-06-06 NOTE — Progress Notes (Signed)
I connected by phone with [name] on 06/06/2020 at 1:34 PM to discuss the potential use of a new treatment for mild to moderate COVID-19 viral infection in non-hospitalized patients.  This patient is a 71 y.o. male that meets the FDA criteria for Emergency Use Authorization of COVID monoclonal antibody sotrovimab.  Has a (+) direct SARS-CoV-2 viral test result  Has mild or moderate COVID-19   Is NOT hospitalized due to COVID-19  Is within 10 days of symptom onset  Has at least one of the high risk factor(s) for progression to severe COVID-19 and/or hospitalization as defined in EUA.  Specific high risk criteria : Older age (>/= 69 yo)   I have spoken and communicated the following to the patient or parent/caregiver regarding COVID monoclonal antibody treatment:  1. FDA has authorized the emergency use for the treatment of mild to moderate COVID-19 in adults and pediatric patients with positive results of direct SARS-CoV-2 viral testing who are 90 years of age and older weighing at least 40 kg, and who are at high risk for progressing to severe COVID-19 and/or hospitalization.  2. The significant known and potential risks and benefits of COVID monoclonal antibody, and the extent to which such potential risks and benefits are unknown.  3. Information on available alternative treatments and the risks and benefits of those alternatives, including clinical trials.  4. Patients treated with COVID monoclonal antibody should continue to self-isolate and use infection control measures (e.g., wear mask, isolate, social distance, avoid sharing personal items, clean and disinfect "high touch" surfaces, and frequent handwashing) according to CDC guidelines.   5. The patient or parent/caregiver has the option to accept or refuse COVID monoclonal antibody treatment.  After reviewing this information with the patient, the patient has agreed to receive one of the available covid 19 monoclonal  antibodies and will be provided an appropriate fact sheet prior to infusion.  Sx onset 2/6, unvaccinated, Age , Home test   Rexene Edison, NP 06/06/2020 1:34 PM

## 2020-06-07 ENCOUNTER — Ambulatory Visit (HOSPITAL_COMMUNITY): Payer: Medicare Other

## 2020-06-08 ENCOUNTER — Ambulatory Visit (HOSPITAL_COMMUNITY): Payer: Medicare Other

## 2020-06-11 DIAGNOSIS — D696 Thrombocytopenia, unspecified: Secondary | ICD-10-CM | POA: Diagnosis not present

## 2020-06-11 DIAGNOSIS — E7800 Pure hypercholesterolemia, unspecified: Secondary | ICD-10-CM | POA: Diagnosis not present

## 2020-06-11 DIAGNOSIS — U071 COVID-19: Secondary | ICD-10-CM | POA: Diagnosis not present

## 2020-06-11 DIAGNOSIS — E559 Vitamin D deficiency, unspecified: Secondary | ICD-10-CM | POA: Diagnosis not present

## 2020-06-11 DIAGNOSIS — Z125 Encounter for screening for malignant neoplasm of prostate: Secondary | ICD-10-CM | POA: Diagnosis not present

## 2020-06-11 DIAGNOSIS — M25561 Pain in right knee: Secondary | ICD-10-CM | POA: Diagnosis not present

## 2020-06-11 DIAGNOSIS — K760 Fatty (change of) liver, not elsewhere classified: Secondary | ICD-10-CM | POA: Diagnosis not present

## 2020-06-11 DIAGNOSIS — I10 Essential (primary) hypertension: Secondary | ICD-10-CM | POA: Diagnosis not present

## 2020-06-11 DIAGNOSIS — R42 Dizziness and giddiness: Secondary | ICD-10-CM | POA: Diagnosis not present

## 2020-06-11 DIAGNOSIS — R52 Pain, unspecified: Secondary | ICD-10-CM | POA: Diagnosis not present

## 2020-06-11 DIAGNOSIS — R110 Nausea: Secondary | ICD-10-CM | POA: Diagnosis not present

## 2020-06-11 DIAGNOSIS — I2510 Atherosclerotic heart disease of native coronary artery without angina pectoris: Secondary | ICD-10-CM | POA: Diagnosis not present

## 2020-06-13 DIAGNOSIS — S83241D Other tear of medial meniscus, current injury, right knee, subsequent encounter: Secondary | ICD-10-CM | POA: Diagnosis not present

## 2020-06-13 DIAGNOSIS — M25562 Pain in left knee: Secondary | ICD-10-CM | POA: Diagnosis not present

## 2020-06-13 DIAGNOSIS — M25561 Pain in right knee: Secondary | ICD-10-CM | POA: Diagnosis not present

## 2020-06-14 ENCOUNTER — Ambulatory Visit: Payer: Medicare Other | Admitting: Psychology

## 2020-06-15 DIAGNOSIS — Z125 Encounter for screening for malignant neoplasm of prostate: Secondary | ICD-10-CM | POA: Diagnosis not present

## 2020-06-15 DIAGNOSIS — E7800 Pure hypercholesterolemia, unspecified: Secondary | ICD-10-CM | POA: Diagnosis not present

## 2020-06-15 DIAGNOSIS — D696 Thrombocytopenia, unspecified: Secondary | ICD-10-CM | POA: Diagnosis not present

## 2020-06-15 DIAGNOSIS — K760 Fatty (change of) liver, not elsewhere classified: Secondary | ICD-10-CM | POA: Diagnosis not present

## 2020-06-15 DIAGNOSIS — U071 COVID-19: Secondary | ICD-10-CM | POA: Diagnosis not present

## 2020-06-15 DIAGNOSIS — I2510 Atherosclerotic heart disease of native coronary artery without angina pectoris: Secondary | ICD-10-CM | POA: Diagnosis not present

## 2020-06-15 DIAGNOSIS — I10 Essential (primary) hypertension: Secondary | ICD-10-CM | POA: Diagnosis not present

## 2020-06-15 DIAGNOSIS — R7303 Prediabetes: Secondary | ICD-10-CM | POA: Diagnosis not present

## 2020-06-15 DIAGNOSIS — E559 Vitamin D deficiency, unspecified: Secondary | ICD-10-CM | POA: Diagnosis not present

## 2020-06-19 ENCOUNTER — Other Ambulatory Visit: Payer: Medicare Other

## 2020-06-21 ENCOUNTER — Ambulatory Visit: Payer: Medicare Other | Admitting: Psychology

## 2020-06-21 ENCOUNTER — Ambulatory Visit
Admission: RE | Admit: 2020-06-21 | Discharge: 2020-06-21 | Disposition: A | Discharge status: Home / Self Care | Payer: Medicare Other | Source: Ambulatory Visit | Attending: Gastroenterology | Admitting: Gastroenterology

## 2020-06-21 ENCOUNTER — Other Ambulatory Visit: Payer: Self-pay

## 2020-06-21 DIAGNOSIS — K219 Gastro-esophageal reflux disease without esophagitis: Secondary | ICD-10-CM | POA: Diagnosis not present

## 2020-06-21 DIAGNOSIS — J4540 Moderate persistent asthma, uncomplicated: Secondary | ICD-10-CM | POA: Diagnosis not present

## 2020-06-21 DIAGNOSIS — K862 Cyst of pancreas: Secondary | ICD-10-CM

## 2020-06-21 DIAGNOSIS — E7800 Pure hypercholesterolemia, unspecified: Secondary | ICD-10-CM | POA: Diagnosis not present

## 2020-06-21 DIAGNOSIS — I10 Essential (primary) hypertension: Secondary | ICD-10-CM | POA: Diagnosis not present

## 2020-06-21 DIAGNOSIS — K7460 Unspecified cirrhosis of liver: Secondary | ICD-10-CM | POA: Diagnosis not present

## 2020-06-21 DIAGNOSIS — I2510 Atherosclerotic heart disease of native coronary artery without angina pectoris: Secondary | ICD-10-CM | POA: Diagnosis not present

## 2020-06-21 DIAGNOSIS — N400 Enlarged prostate without lower urinary tract symptoms: Secondary | ICD-10-CM | POA: Diagnosis not present

## 2020-06-21 DIAGNOSIS — G4700 Insomnia, unspecified: Secondary | ICD-10-CM | POA: Diagnosis not present

## 2020-06-21 MED ORDER — GADOBENATE DIMEGLUMINE 529 MG/ML IV SOLN
17.0000 mL | Freq: Once | INTRAVENOUS | Status: AC | PRN
  Administered 2020-06-21: 18 mL via INTRAVENOUS

## 2020-06-28 ENCOUNTER — Other Ambulatory Visit: Payer: Self-pay

## 2020-06-28 ENCOUNTER — Ambulatory Visit: Payer: Medicare Other | Admitting: Psychology

## 2020-06-28 ENCOUNTER — Ambulatory Visit (INDEPENDENT_AMBULATORY_CARE_PROVIDER_SITE_OTHER): Payer: Medicare Other | Admitting: Cardiovascular Disease

## 2020-06-28 ENCOUNTER — Encounter: Payer: Self-pay | Admitting: Cardiovascular Disease

## 2020-06-28 VITALS — BP 116/67 | HR 67 | Ht 71.5 in | Wt 176.0 lb

## 2020-06-28 DIAGNOSIS — E7800 Pure hypercholesterolemia, unspecified: Secondary | ICD-10-CM

## 2020-06-28 DIAGNOSIS — I712 Thoracic aortic aneurysm, without rupture: Secondary | ICD-10-CM | POA: Diagnosis not present

## 2020-06-28 DIAGNOSIS — I10 Essential (primary) hypertension: Secondary | ICD-10-CM | POA: Diagnosis not present

## 2020-06-28 DIAGNOSIS — I7121 Aneurysm of the ascending aorta, without rupture: Secondary | ICD-10-CM

## 2020-06-28 DIAGNOSIS — I2510 Atherosclerotic heart disease of native coronary artery without angina pectoris: Secondary | ICD-10-CM

## 2020-06-28 DIAGNOSIS — I351 Nonrheumatic aortic (valve) insufficiency: Secondary | ICD-10-CM | POA: Diagnosis not present

## 2020-06-28 NOTE — Patient Instructions (Signed)
Medication Instructions:  STOP ROSUVASTATIN   *If you need a refill on your cardiac medications before your next appointment, please call your pharmacy*  Lab Work: FASTING LP/CMET IN 3 MONTHS  If you have labs (blood work) drawn today and your tests are completely normal, you will receive your results only by: Marland Kitchen MyChart Message (if you have MyChart) OR . A paper copy in the mail If you have any lab test that is abnormal or we need to change your treatment, we will call you to review the results.  Testing/Procedures: Your physician has requested that you have an echocardiogram. Echocardiography is a painless test that uses sound waves to create images of your heart. It provides your doctor with information about the size and shape of your heart and how well your heart's chambers and valves are working. This procedure takes approximately one hour. There are no restrictions for this procedure. [place] [address]   Follow-Up: At [facility], you and your health needs are our priority.  As part of our continuing mission to provide you with exceptional heart care, we have created designated Provider Care Teams.  These Care Teams include your primary Cardiologist (physician) and Advanced Practice Providers (APPs -  Physician Assistants and Nurse Practitioners) who all work together to provide you with the care you need, when you need it.  We recommend signing up for the patient portal called "MyChart".  Sign up information is provided on this After Visit Summary.  MyChart is used to connect with patients for Virtual Visits (Telemedicine).  Patients are able to view lab/test results, encounter notes, upcoming appointments, etc.  Non-urgent messages can be sent to your provider as well.   To learn more about what you can do with MyChart, go to [email or web address].    Your next appointment:   12 month(s)  The format for your next appointment:   In Person  Provider:    You may see Skeet Latch, MD or one of the following Advanced Practice Providers on your designated Care Team:    [place], PA-C  Coletta Memos, FNP

## 2020-06-28 NOTE — Progress Notes (Signed)
Cardiology Office Note   Date:  06/28/2020  ID:  Arthur Moore, DOB March 25, 1951, MRN [id number]  PCP:  Vernie Shanks, MD  Cardiologist:   Skeet Latch, MD   No chief complaint on file.   History of Present Illness: Arthur Moore is a 70 y.o. male with mild CAD, mild-moderate aortic insufficiency, hypertension, asthma,TIA, OSA not on CPAP, liver fibrosis, anxiety, and depression who presents for follow up.  Arthur Moore wore a Holter monitor 06/2013 that revealed rare PACs and PVCs.  He previously followed up with a cardiologist in [place] due to mild AR.  He subsequently had an echo at the [place] in 2015 that reported it as mild-moderate.  He had a repeat echocardiogram 12/05/15 revealed LVEF 55-60% with grade 2 diastolic. Aortic regurgitation remained mild to moderate.  Arthur Moore reported fatigue and atypical chest pain.  He was referred for an exercise Myoview 3/18 that revealed LVEF 49% and asynchronous contraction. There was a small defect in the basal inferior region that was thought to be diaphragmatic attenuation.  He achieved 7 METS on a Bruce protocol.  He called our office after these results due to persistent chest pain and it was suggested that he follow up with GI.  He was subsequently diagnosed with diverticulitis.  Arthur Moore saw [name], PA-C, for chest pain.  He had previously been seen in the ED with chest pain.  Cardiac enzymes and CT of the chest, abdomen, and pelvis were unremarkable.  His chest pain was atypical and occurred when he was trying to lie down in bed at night.  He was referred for coronary CT-A 04/2017 that revealed a coronary calcium score 361 which is 78th percentile.  He had extensive calcified plaque in the proximal LAD with possible moderate stenosis.  FFR was negative.  Repeat echo 08/2017 revealed LVEF 55 to 65% with grade 1 diastolic dysfunction.  Since his last appointment he was seen in the ED 06/2018 with chest pain.  Cardiac enzymes  were negative.  He underwent cardiac catheterization 06/2018 which revealed 50% stenosis in D1 and otherwise 10 to 20% stenoses in the distal RCA, proximal left circumflex, distal left main, and proximal LAD.  Arthur Moore was started on Vascepa for hypertriglyceridemia and CAD.  It worked well for his triglycerides.  However he was unable to afford it.  He tried to get it filled through the [place] and was denied.  Metoprolol succinate was also denied as were PCSK9 inhibitors.    Arthur Moore was seen by Coletta Memos on 04/2019 after a visit to the ED for chest pain.  His ischemia evaluation was unremarkable.  His chest pain was worse with inhalation.  He had no exertional chest pain.  No further work-up was performed. He recently had blood work at the [place] and the numbers were excellent.  His lipids were all at gaol.  He has no LE edema, orthopnea or PND.  He has no exertional chest pain but he does have GERD.  His weight has been stable.    Since his last appointment he has been doing well.  He and his wife continue to follow a very healthy Mediterranean diet.  He has question a about whether his cholesterol is too low.  He hasn't been exercising as much due to the mask mandate at the [facility].  He has no exertional chest pain or shortness of breath.  Since he had COVID-19 he hasn't been as active.  His legs feel heavy  at times and he is fatigued.  He thinks this is attributable to not exercising.  He denies claudication or edema.    Past Medical History:  Diagnosis Date  . Aortic insufficiency   . Arthritis   . Asthma   . Cervical disc disease   . Depression   . Diastolic dysfunction 10/02/3708  . Hyperlipidemia 08/24/2015  . Hypertension   . Lumbar disc disease   . Psoriasis     Past Surgical History:  Procedure Laterality Date  . CHOLECYSTECTOMY  2012  . HERNIA REPAIR    . LEFT HEART CATH AND CORONARY ANGIOGRAPHY N/A 07/08/2018   Procedure: LEFT HEART CATH AND CORONARY ANGIOGRAPHY;  Surgeon:  Burnell Blanks, MD;  Location: [facility];  Service: Cardiovascular;  Laterality: N/A;  . PAROTIDECTOMY Right 1992  . PROSTATE SURGERY  2002, 2004  . SINOSCOPY    . SINUS EXPLORATION  2013  . SPINE SURGERY  2013   L C7-T1 laminectomy and discectomy  . TONSILLECTOMY       Current Outpatient Medications  Medication Sig Dispense Refill  . albuterol (PROVENTIL HFA) 108 (90 Base) MCG/ACT inhaler Inhale 2 puffs into the lungs every 4 (four) hours as needed for wheezing or shortness of breath. 18 g 1  . aspirin EC 81 MG tablet Take 81 mg by mouth daily.    . budesonide (RHINOCORT AQUA) 32 MCG/ACT nasal spray Use two sprays in each nostril once daily 8.43 mL 5  . budesonide-formoterol (SYMBICORT) 160-4.5 MCG/ACT inhaler Inhale 2 puffs into the lungs 2 (two) times daily.    . Cetirizine HCl (ZYRTEC ALLERGY PO) Take 1 tablet by mouth daily.    . Cholecalciferol (VITAMIN D3) 5000 units TABS Take 5,000 Units by mouth daily.     Marland Kitchen CRANBERRY PO Take 900 mg by mouth daily.     Marland Kitchen dicyclomine (BENTYL) 20 MG tablet 1 capsule    . famotidine (PEPCID) 10 MG tablet 1 tablet as needed    . hydroxychloroquine (PLAQUENIL) 200 MG tablet hydroxychloroquine 200 mg tablet  TAKE ONE TABLET BY MOUTH TWICE A DAY    . lamoTRIgine (LAMICTAL) 200 MG tablet Take 400 mg by mouth daily.     . Magnesium Oxide (MAG-OXIDE) 200 MG TABS Take 2.5 tablets (500 mg total) by mouth daily. (Patient taking differently: Take 2 tablets by mouth daily.)  0  . metoprolol tartrate (LOPRESSOR) 25 MG tablet TAKE 1/2 TABLET TWICE A DAY (Patient taking differently: Take 12.5 mg by mouth 2 (two) times daily. TAKE 1/2 TABLET TWICE A DAY) 90 tablet 3  . Misc Natural Products (OSTEO BI-FLEX ADV JOINT SHIELD PO) Take 1 tablet by mouth daily.    . Multiple Vitamin (MULTIVITAMIN) capsule Take 1 capsule by mouth daily.    . pantoprazole (PROTONIX) 40 MG tablet Take 1 tablet (40 mg total) by mouth daily. 90 tablet 1  . Probiotic  Product (PROBIOTIC DAILY PO) Take 1 tablet by mouth daily.    . Pyridoxine HCl (VITAMIN B-6) 250 MG tablet Take 250 mg by mouth daily.    Marland Kitchen QUERCETIN PO Take by mouth.    . vitamin C (ASCORBIC ACID) 250 MG tablet Take 250 mg by mouth daily.    Marland Kitchen zinc gluconate 50 MG tablet Take 1 tablet (50 mg total) by mouth daily.    . nitroGLYCERIN (NITROSTAT) 0.4 MG SL tablet Place 1 tablet (0.4 mg total) under the tongue every 5 (five) minutes as needed for chest pain. 25 tablet  3  . rosuvastatin (CRESTOR) 5 MG tablet Take 1 tablet (5 mg total) by mouth daily. 90 tablet 3   No current facility-administered medications for this visit.    Allergies:   Atorvastatin and Zolpidem    Social History:  The patient  reports that he has never smoked. He has never used smokeless tobacco. He reports current alcohol use. He reports that he does not use drugs.   Family History:  The patient's family history includes Bipolar disorder in his brother; Cancer in an other family member; Colon cancer in his mother; Lung cancer in his father.    ROS:  Please see the history of present illness.   Otherwise, review of systems are positive for none.   All other systems are reviewed and negative.    PHYSICAL EXAM: VS:  There were no vitals taken for this visit. , BMI There is no height or weight on file to calculate BMI. GENERAL:  Well appearing HEENT: Pupils equal round and reactive, fundi not visualized, oral mucosa unremarkable NECK:  No jugular venous distention, waveform within normal limits, carotid upstroke brisk and symmetric, no bruits LUNGS:  Clear to auscultation bilaterally HEART:  RRR.  PMI not displaced or sustained,S1 and S2 within normal limits, no S3, no S4, no clicks, no rubs, no murmurs ABD:  Flat, positive bowel sounds normal in frequency in pitch, no bruits, no rebound, no guarding, no midline pulsatile mass, no hepatomegaly, no splenomegaly EXT:  2 plus pulses throughout, no edema, no cyanosis no  clubbing SKIN:  No rashes no nodules NEURO:  Cranial nerves II through XII grossly intact, motor grossly intact throughout PSYCH:  Cognitively intact, oriented to person place and time   EKG:  EKG is not ordered today. 06/20/16: Sinus rhythm. Rate 64 bpm. 12/29/17: Sinus rhythm.  Rate 61 bpm. 03/16/2019: Sinus rhythm.  Rate 64 bpm. 01/01/2020: Sinus rhythm.  Rate 63 bpm.  Echo 04/25/14: LVEF 60%.  Aortic valve sclerosis without stenosis. Mild aortic insufficiency.  ETT 04/2013:  Exercise 8 minutes and 44 seconds on a Bruce protocol. Peak heart rate 136. 9.8 METS. No ischemic changes.  24 Hour Holter: Underlying rhythm is sinus. Average heart rate 74, minimum 52, max 123. Rare PACs and PVCs. No significant arrhythmia noted.  Echo 12/05/15: Study Conclusions  - Left ventricle: Global LV longitudinal strain is normal at -16.2%   There is a false tendon in the LV apex of no clinical   significance. The cavity size was normal. There was moderate   focal basal hypertrophy. Systolic function was normal. The   estimated ejection fraction was in the range of 55% to 60%. Wall   motion was normal; there were no regional wall motion   abnormalities. Features are consistent with a pseudonormal left   ventricular filling pattern, with concomitant abnormal relaxation   and increased filling pressure (grade 2 diastolic dysfunction). - Aortic valve: Trileaflet; mildly thickened leaflets. There was   mild to moderate regurgitation directed eccentrically in the LVOT   and towards the mitral anterior leaflet. - Mitral valve: There was mild regurgitation. - Pulmonic valve: There was mild regurgitation.  Echo 08/28/17: Study Conclusions  - Left ventricle: The cavity size was normal. There was mild   concentric hypertrophy. Systolic function was normal. The   estimated ejection fraction was in the range of 55% to 65%. Wall   motion was normal; there were no regional wall motion   abnormalities. Doppler  parameters are consistent with abnormal  left ventricular relaxation (grade 1 diastolic dysfunction). - Aortic valve: There was mild to moderate regurgitation directed   eccentrically to the anterior leaflet of the mitral valve. - Left atrium: The atrium was normal in size. - Right ventricle: Systolic function was normal. - Pulmonary arteries: Systolic pressure was within the normal   range.  Exercise Myoview 06/26/16: Nuclear stress EF: 49%. Asynchronous contraction.  The left ventricular ejection fraction is mildly decreased (45-54%).  There was no ST segment deviation noted during stress.  Defect 1: There is a small defect of mild severity present in the basal inferior location. Likely diaphragmatic attenuation artifact.  This is a low risk study. No ischemia identified  Coronary CT-A 05/15/17: IMPRESSION: 1. Coronary artery calcium score 361 Agatston units, placing the patient in the 78th percentile for age and gender, suggesting intermediate to high risk for future cardiac events.  2. Extensive calcified plaque in the proximal LAD, possible moderate stenosis though blooming artifact may make the disease look worse than it actually is.  FFR 0.83 in mid LAD. I do no think there is hemodynamically significant disease in the LAD. The LCx and RCA have no hemodynamically significant disease.  LHC 07/08/18:  Dist RCA lesion is 20% stenosed.  Prox Cx lesion is 10% stenosed.  Dist LM to Ost LAD lesion is 20% stenosed.  Ost 1st Diag lesion is 50% stenosed.  The left ventricular systolic function is normal.  LV end diastolic pressure is normal.  The left ventricular ejection fraction is 50-55% by visual estimate.  There is no mitral valve regurgitation.  Prox LAD lesion is 20% stenosed.   1. Mild non-obstructive CAD 2. Low normal LV systolic function   Echo 05/3534: Study Conclusions  - Left ventricle: The cavity size was normal. There was mild   concentric  hypertrophy. Systolic function was normal. The   estimated ejection fraction was in the range of 55% to 65%. Wall   motion was normal; there were no regional wall motion   abnormalities. Doppler parameters are consistent with abnormal   left ventricular relaxation (grade 1 diastolic dysfunction). - Aortic valve: There was mild to moderate regurgitation directed   eccentrically to the anterior leaflet of the mitral valve. - Left atrium: The atrium was normal in size. - Right ventricle: Systolic function was normal. - Pulmonary arteries: Systolic pressure was within the normal   range.   Recent Labs: 02/05/2020: BUN 20; Creatinine, Ser 1.24; Hemoglobin 14.9; Platelets 129; Potassium 3.8; Sodium 141    Lipid Panel    Component Value Date/Time   CHOL 102 03/08/2019 1043   TRIG 94 03/08/2019 1043   HDL 34 (L) 03/08/2019 1043   CHOLHDL 3.0 03/08/2019 1043   CHOLHDL 4.1 07/09/2018 0407   VLDL 25 07/09/2018 0407   LDLCALC 50 03/08/2019 1043      11/2014: Total cholesterol 130, HDL 39, LDL 65, triglycerides 116 Total chol 133, tri 211, ldl 55, hdl 34  Wt Readings from Last 3 Encounters:  06/04/20 183 lb (83 kg)  02/24/20 172 lb 13.5 oz (78.4 kg)  02/05/20 172 lb 14.4 oz (78.4 kg)      ASSESSMENT AND PLAN:  # Moderate, non-obstructive CAD:  # Hyperlipidemia:  # Atypical chest pain:  Left heart cath revealed 50% ostial D1 disease and otherwise minimal, non-obstructive CAD.  Continue medical management with aspirin, metoprolol, and Praluent.  Lipids are checked at the [facility] and well-controlled.  He will hold rosuvastatin for two weeks to see if it helps  his legs.  If his symptoms are improved, continue to hold and repeat lipids in a couple months to ensure lipids remain at goal.  He is doing very well and has no exertional chest pain.  His arm discomfort was radicular pain from his neck.   # Aortic regurgitation: # Mild aortic aneurysm:   Stable on echo 11/2015 and 08/2017.  Repeat  echo.  BP has been low due to diet and weight loss.  If aorta is stable, reduce metoprolol to 12.5mg  daily.  # Diastolic dysfunction:  # Hypertension:  Grade 2 on echo improved to grade 1 diastolic dysfunction on repeat.  He is euvolemic and blood pressure is well-controlled.  Continue metoprolol.  # TIA: Continue aspirin 81 mg daily and lipid control as above.  # Depression: # Anxiety:   Symptoms improving with new therapy.  His mood continues to be much better.  Continue exercise.  # Advanced directives:  Patient presented AD paperwork including signed DNR form.  Confirmed that he did not want to be DNR but does not want prolonged ventilation or living in a vegetative state.   Current medicines are reviewed at length with the patient today.  The patient does not have concerns regarding medicines.  The following changes have been made: none  Labs/ tests ordered today include:   No orders of the defined types were placed in this encounter.   Disposition:   FU with Zanaria Morell C. Oval Linsey, MD in 1 year.  Time spent: 45 minutes-Greater than 50% of this time was spent in counseling, explanation of diagnosis, planning of further management, and coordination of care.   Signed, Skeet Latch, MD  06/28/2020 8:01 AM    [place]

## 2020-07-03 ENCOUNTER — Ambulatory Visit (INDEPENDENT_AMBULATORY_CARE_PROVIDER_SITE_OTHER): Payer: Medicare Other

## 2020-07-03 ENCOUNTER — Other Ambulatory Visit: Payer: Self-pay | Admitting: Allergy

## 2020-07-03 DIAGNOSIS — J309 Allergic rhinitis, unspecified: Secondary | ICD-10-CM | POA: Diagnosis not present

## 2020-07-03 DIAGNOSIS — J3089 Other allergic rhinitis: Secondary | ICD-10-CM

## 2020-07-19 ENCOUNTER — Ambulatory Visit: Payer: Medicare Other | Admitting: Psychology

## 2020-07-20 ENCOUNTER — Ambulatory Visit (HOSPITAL_COMMUNITY): Payer: Medicare Other | Attending: Cardiology

## 2020-07-20 ENCOUNTER — Other Ambulatory Visit: Payer: Self-pay

## 2020-07-20 DIAGNOSIS — I10 Essential (primary) hypertension: Secondary | ICD-10-CM | POA: Insufficient documentation

## 2020-07-20 DIAGNOSIS — I712 Thoracic aortic aneurysm, without rupture: Secondary | ICD-10-CM

## 2020-07-20 DIAGNOSIS — I7121 Aneurysm of the ascending aorta, without rupture: Secondary | ICD-10-CM

## 2020-07-20 LAB — ECHOCARDIOGRAM COMPLETE
Area-P 1/2: 3.6 cm2
P 1/2 time: 1106 msec
S' Lateral: 3.2 cm

## 2020-07-25 DIAGNOSIS — I2510 Atherosclerotic heart disease of native coronary artery without angina pectoris: Secondary | ICD-10-CM | POA: Diagnosis not present

## 2020-07-25 DIAGNOSIS — K219 Gastro-esophageal reflux disease without esophagitis: Secondary | ICD-10-CM | POA: Diagnosis not present

## 2020-07-25 DIAGNOSIS — G4700 Insomnia, unspecified: Secondary | ICD-10-CM | POA: Diagnosis not present

## 2020-07-25 DIAGNOSIS — E7800 Pure hypercholesterolemia, unspecified: Secondary | ICD-10-CM | POA: Diagnosis not present

## 2020-07-25 DIAGNOSIS — J4540 Moderate persistent asthma, uncomplicated: Secondary | ICD-10-CM | POA: Diagnosis not present

## 2020-07-25 DIAGNOSIS — N400 Enlarged prostate without lower urinary tract symptoms: Secondary | ICD-10-CM | POA: Diagnosis not present

## 2020-07-25 DIAGNOSIS — I10 Essential (primary) hypertension: Secondary | ICD-10-CM | POA: Diagnosis not present

## 2020-07-30 ENCOUNTER — Other Ambulatory Visit: Payer: Self-pay

## 2020-07-30 MED ORDER — LAMOTRIGINE 200 MG PO TABS: 200.0000 mg | ORAL_TABLET | Freq: Two times a day (BID) | ORAL | 5 refills | Status: DC

## 2020-07-30 NOTE — Telephone Encounter (Signed)
Spoke with Arthur Moore regarding echo results. Results given, all questions answered. Pt verbalizes understanding.

## 2020-08-07 ENCOUNTER — Ambulatory Visit (INDEPENDENT_AMBULATORY_CARE_PROVIDER_SITE_OTHER): Payer: Medicare Other | Admitting: *Deleted

## 2020-08-07 DIAGNOSIS — J309 Allergic rhinitis, unspecified: Secondary | ICD-10-CM | POA: Diagnosis not present

## 2020-08-08 DIAGNOSIS — H43392 Other vitreous opacities, left eye: Secondary | ICD-10-CM | POA: Diagnosis not present

## 2020-08-08 DIAGNOSIS — H18413 Arcus senilis, bilateral: Secondary | ICD-10-CM | POA: Diagnosis not present

## 2020-08-08 DIAGNOSIS — H04123 Dry eye syndrome of bilateral lacrimal glands: Secondary | ICD-10-CM | POA: Diagnosis not present

## 2020-08-08 DIAGNOSIS — H2513 Age-related nuclear cataract, bilateral: Secondary | ICD-10-CM | POA: Diagnosis not present

## 2020-08-08 DIAGNOSIS — H40013 Open angle with borderline findings, low risk, bilateral: Secondary | ICD-10-CM | POA: Diagnosis not present

## 2020-08-13 ENCOUNTER — Ambulatory Visit (INDEPENDENT_AMBULATORY_CARE_PROVIDER_SITE_OTHER): Payer: Medicare Other | Admitting: *Deleted

## 2020-08-13 DIAGNOSIS — J309 Allergic rhinitis, unspecified: Secondary | ICD-10-CM | POA: Diagnosis not present

## 2020-08-29 ENCOUNTER — Ambulatory Visit (INDEPENDENT_AMBULATORY_CARE_PROVIDER_SITE_OTHER): Payer: Medicare Other

## 2020-08-29 DIAGNOSIS — J309 Allergic rhinitis, unspecified: Secondary | ICD-10-CM | POA: Diagnosis not present

## 2020-08-31 DIAGNOSIS — I2510 Atherosclerotic heart disease of native coronary artery without angina pectoris: Secondary | ICD-10-CM | POA: Diagnosis not present

## 2020-08-31 DIAGNOSIS — I10 Essential (primary) hypertension: Secondary | ICD-10-CM | POA: Diagnosis not present

## 2020-08-31 DIAGNOSIS — J45901 Unspecified asthma with (acute) exacerbation: Secondary | ICD-10-CM | POA: Diagnosis not present

## 2020-08-31 DIAGNOSIS — J4521 Mild intermittent asthma with (acute) exacerbation: Secondary | ICD-10-CM | POA: Diagnosis not present

## 2020-08-31 DIAGNOSIS — R0602 Shortness of breath: Secondary | ICD-10-CM | POA: Diagnosis not present

## 2020-09-03 ENCOUNTER — Ambulatory Visit (INDEPENDENT_AMBULATORY_CARE_PROVIDER_SITE_OTHER): Payer: Medicare Other | Admitting: *Deleted

## 2020-09-03 DIAGNOSIS — J309 Allergic rhinitis, unspecified: Secondary | ICD-10-CM | POA: Diagnosis not present

## 2020-09-03 DIAGNOSIS — R1013 Epigastric pain: Secondary | ICD-10-CM | POA: Diagnosis not present

## 2020-09-13 ENCOUNTER — Ambulatory Visit (INDEPENDENT_AMBULATORY_CARE_PROVIDER_SITE_OTHER): Payer: Medicare Other

## 2020-09-13 DIAGNOSIS — J309 Allergic rhinitis, unspecified: Secondary | ICD-10-CM

## 2020-09-18 ENCOUNTER — Ambulatory Visit (INDEPENDENT_AMBULATORY_CARE_PROVIDER_SITE_OTHER): Payer: Medicare Other | Admitting: *Deleted

## 2020-09-18 DIAGNOSIS — J309 Allergic rhinitis, unspecified: Secondary | ICD-10-CM | POA: Diagnosis not present

## 2020-09-20 DIAGNOSIS — H9222 Otorrhagia, left ear: Secondary | ICD-10-CM | POA: Diagnosis not present

## 2020-09-26 ENCOUNTER — Ambulatory Visit (INDEPENDENT_AMBULATORY_CARE_PROVIDER_SITE_OTHER): Payer: Medicare Other

## 2020-09-26 DIAGNOSIS — J309 Allergic rhinitis, unspecified: Secondary | ICD-10-CM

## 2020-10-01 DIAGNOSIS — L4050 Arthropathic psoriasis, unspecified: Secondary | ICD-10-CM | POA: Diagnosis not present

## 2020-10-01 DIAGNOSIS — G4700 Insomnia, unspecified: Secondary | ICD-10-CM | POA: Diagnosis not present

## 2020-10-01 DIAGNOSIS — J4540 Moderate persistent asthma, uncomplicated: Secondary | ICD-10-CM | POA: Diagnosis not present

## 2020-10-01 DIAGNOSIS — E7800 Pure hypercholesterolemia, unspecified: Secondary | ICD-10-CM | POA: Diagnosis not present

## 2020-10-01 DIAGNOSIS — I10 Essential (primary) hypertension: Secondary | ICD-10-CM | POA: Diagnosis not present

## 2020-10-01 DIAGNOSIS — N400 Enlarged prostate without lower urinary tract symptoms: Secondary | ICD-10-CM | POA: Diagnosis not present

## 2020-10-01 DIAGNOSIS — I2510 Atherosclerotic heart disease of native coronary artery without angina pectoris: Secondary | ICD-10-CM | POA: Diagnosis not present

## 2020-10-01 DIAGNOSIS — K219 Gastro-esophageal reflux disease without esophagitis: Secondary | ICD-10-CM | POA: Diagnosis not present

## 2020-10-09 DIAGNOSIS — H9222 Otorrhagia, left ear: Secondary | ICD-10-CM | POA: Diagnosis not present

## 2020-10-18 ENCOUNTER — Other Ambulatory Visit: Payer: Self-pay | Admitting: *Deleted

## 2020-10-18 ENCOUNTER — Ambulatory Visit (INDEPENDENT_AMBULATORY_CARE_PROVIDER_SITE_OTHER): Payer: Medicare Other | Admitting: *Deleted

## 2020-10-18 DIAGNOSIS — J309 Allergic rhinitis, unspecified: Secondary | ICD-10-CM

## 2020-10-18 DIAGNOSIS — E7800 Pure hypercholesterolemia, unspecified: Secondary | ICD-10-CM

## 2020-10-18 DIAGNOSIS — I10 Essential (primary) hypertension: Secondary | ICD-10-CM

## 2020-10-19 ENCOUNTER — Encounter (HOSPITAL_BASED_OUTPATIENT_CLINIC_OR_DEPARTMENT_OTHER): Payer: Self-pay

## 2020-10-19 LAB — LIPID PANEL
Chol/HDL Ratio: 6.2 ratio — ABNORMAL HIGH (ref 0.0–5.0)
Cholesterol, Total: 181 mg/dL (ref 100–199)
HDL: 29 mg/dL — ABNORMAL LOW (ref >39)
LDL Chol Calc (NIH): 122 mg/dL — ABNORMAL HIGH (ref 0–99)
Triglycerides: 165 mg/dL — ABNORMAL HIGH (ref 0–149)
VLDL Cholesterol Cal: 30 mg/dL (ref 5–40)

## 2020-10-19 LAB — COMPREHENSIVE METABOLIC PANEL
ALT: 21 IU/L (ref 0–44)
AST: 20 IU/L (ref 0–40)
Albumin/Globulin Ratio: 2.5 — ABNORMAL HIGH (ref 1.2–2.2)
Albumin: 4.7 g/dL (ref 3.8–4.8)
Alkaline Phosphatase: 89 IU/L (ref 44–121)
BUN/Creatinine Ratio: 15 (ref 10–24)
BUN: 20 mg/dL (ref 8–27)
Bilirubin Total: 0.7 mg/dL (ref 0.0–1.2)
CO2: 23 mmol/L (ref 20–29)
Calcium: 9.7 mg/dL (ref 8.6–10.2)
Chloride: 107 mmol/L — ABNORMAL HIGH (ref 96–106)
Creatinine, Ser: 1.37 mg/dL — ABNORMAL HIGH (ref 0.76–1.27)
Globulin, Total: 1.9 g/dL (ref 1.5–4.5)
Glucose: 100 mg/dL — ABNORMAL HIGH (ref 65–99)
Potassium: 4.9 mmol/L (ref 3.5–5.2)
Sodium: 144 mmol/L (ref 134–144)
Total Protein: 6.6 g/dL (ref 6.0–8.5)
eGFR: 56 mL/min/{1.73_m2} — ABNORMAL LOW (ref >59)

## 2020-10-28 ENCOUNTER — Encounter (HOSPITAL_BASED_OUTPATIENT_CLINIC_OR_DEPARTMENT_OTHER): Payer: Self-pay

## 2020-10-28 DIAGNOSIS — I10 Essential (primary) hypertension: Secondary | ICD-10-CM

## 2020-10-28 DIAGNOSIS — Z5181 Encounter for therapeutic drug level monitoring: Secondary | ICD-10-CM

## 2020-10-28 DIAGNOSIS — E7800 Pure hypercholesterolemia, unspecified: Secondary | ICD-10-CM

## 2020-11-07 ENCOUNTER — Encounter (HOSPITAL_BASED_OUTPATIENT_CLINIC_OR_DEPARTMENT_OTHER): Payer: Self-pay

## 2020-11-07 NOTE — Telephone Encounter (Signed)
Spoke with patient on telephone

## 2020-11-08 ENCOUNTER — Other Ambulatory Visit: Payer: Self-pay

## 2020-11-08 ENCOUNTER — Emergency Department (HOSPITAL_BASED_OUTPATIENT_CLINIC_OR_DEPARTMENT_OTHER): Payer: Medicare Other

## 2020-11-08 ENCOUNTER — Emergency Department (HOSPITAL_BASED_OUTPATIENT_CLINIC_OR_DEPARTMENT_OTHER)
Admission: EM | Admit: 2020-11-08 | Discharge: 2020-11-08 | Disposition: A | Discharge status: Home / Self Care | Payer: Medicare Other | Attending: Emergency Medicine | Admitting: Emergency Medicine

## 2020-11-08 ENCOUNTER — Encounter (HOSPITAL_BASED_OUTPATIENT_CLINIC_OR_DEPARTMENT_OTHER): Payer: Self-pay

## 2020-11-08 DIAGNOSIS — N182 Chronic kidney disease, stage 2 (mild): Secondary | ICD-10-CM | POA: Insufficient documentation

## 2020-11-08 DIAGNOSIS — J45909 Unspecified asthma, uncomplicated: Secondary | ICD-10-CM | POA: Diagnosis not present

## 2020-11-08 DIAGNOSIS — Z79899 Other long term (current) drug therapy: Secondary | ICD-10-CM | POA: Diagnosis not present

## 2020-11-08 DIAGNOSIS — I5030 Unspecified diastolic (congestive) heart failure: Secondary | ICD-10-CM | POA: Diagnosis not present

## 2020-11-08 DIAGNOSIS — I130 Hypertensive heart and chronic kidney disease with heart failure and stage 1 through stage 4 chronic kidney disease, or unspecified chronic kidney disease: Secondary | ICD-10-CM | POA: Insufficient documentation

## 2020-11-08 DIAGNOSIS — R519 Headache, unspecified: Secondary | ICD-10-CM | POA: Diagnosis not present

## 2020-11-08 DIAGNOSIS — Z7982 Long term (current) use of aspirin: Secondary | ICD-10-CM | POA: Insufficient documentation

## 2020-11-08 DIAGNOSIS — I2510 Atherosclerotic heart disease of native coronary artery without angina pectoris: Secondary | ICD-10-CM | POA: Diagnosis not present

## 2020-11-08 DIAGNOSIS — R42 Dizziness and giddiness: Secondary | ICD-10-CM | POA: Diagnosis not present

## 2020-11-08 DIAGNOSIS — Z7951 Long term (current) use of inhaled steroids: Secondary | ICD-10-CM | POA: Insufficient documentation

## 2020-11-08 LAB — CBC WITH DIFFERENTIAL/PLATELET
Abs Immature Granulocytes: 0.02 10*3/uL (ref 0.00–0.07)
Basophils Absolute: 0 10*3/uL (ref 0.0–0.1)
Basophils Relative: 0 %
Eosinophils Absolute: 0.4 10*3/uL (ref 0.0–0.5)
Eosinophils Relative: 6 %
HCT: 43.5 % (ref 39.0–52.0)
Hemoglobin: 15 g/dL (ref 13.0–17.0)
Immature Granulocytes: 0 %
Lymphocytes Relative: 34 %
Lymphs Abs: 2.3 10*3/uL (ref 0.7–4.0)
MCH: 28.9 pg (ref 26.0–34.0)
MCHC: 34.5 g/dL (ref 30.0–36.0)
MCV: 83.8 fL (ref 80.0–100.0)
Monocytes Absolute: 0.6 10*3/uL (ref 0.1–1.0)
Monocytes Relative: 9 %
Neutro Abs: 3.5 10*3/uL (ref 1.7–7.7)
Neutrophils Relative %: 51 %
Platelets: 145 10*3/uL — ABNORMAL LOW (ref 150–400)
RBC: 5.19 MIL/uL (ref 4.22–5.81)
RDW: 12.9 % (ref 11.5–15.5)
WBC: 6.9 10*3/uL (ref 4.0–10.5)
nRBC: 0 % (ref 0.0–0.2)

## 2020-11-08 LAB — BASIC METABOLIC PANEL
Anion gap: 8 (ref 5–15)
BUN: 24 mg/dL — ABNORMAL HIGH (ref 8–23)
CO2: 24 mmol/L (ref 22–32)
Calcium: 9.6 mg/dL (ref 8.9–10.3)
Chloride: 106 mmol/L (ref 98–111)
Creatinine, Ser: 1.39 mg/dL — ABNORMAL HIGH (ref 0.61–1.24)
GFR, Estimated: 55 mL/min — ABNORMAL LOW (ref >60)
Glucose, Bld: 87 mg/dL (ref 70–99)
Potassium: 3.9 mmol/L (ref 3.5–5.1)
Sodium: 138 mmol/L (ref 135–145)

## 2020-11-08 MED ORDER — DEXAMETHASONE SODIUM PHOSPHATE 10 MG/ML IJ SOLN
10.0000 mg | Freq: Once | INTRAMUSCULAR | Status: AC
  Administered 2020-11-08: 10 mg via INTRAVENOUS
  Filled 2020-11-08: qty 1

## 2020-11-08 MED ORDER — DIPHENHYDRAMINE HCL 50 MG/ML IJ SOLN
25.0000 mg | Freq: Once | INTRAMUSCULAR | Status: AC
  Administered 2020-11-08: 25 mg via INTRAVENOUS
  Filled 2020-11-08: qty 1

## 2020-11-08 MED ORDER — SODIUM CHLORIDE 0.9 % IV BOLUS
1000.0000 mL | Freq: Once | INTRAVENOUS | Status: AC
  Administered 2020-11-08: 1000 mL via INTRAVENOUS

## 2020-11-08 MED ORDER — PROCHLORPERAZINE EDISYLATE 10 MG/2ML IJ SOLN
10.0000 mg | Freq: Once | INTRAMUSCULAR | Status: AC
  Administered 2020-11-08: 10 mg via INTRAVENOUS
  Filled 2020-11-08: qty 2

## 2020-11-08 NOTE — ED Notes (Signed)
Pt returned from CT °

## 2020-11-08 NOTE — ED Provider Notes (Signed)
[facility] EMERGENCY DEPARTMENT Provider Note   CSN: [id number] Arrival date & time: 11/08/20  1314     History Chief Complaint  Patient presents with   Headache    Arthur Moore is a 70 y.o. male.  The history is provided by the patient.  Headache Pain location:  Frontal Quality:  Dull Radiates to:  L neck Onset quality:  Gradual Duration:  2 days Timing:  Constant Progression:  Unchanged Chronicity:  New Similar to prior headaches: no   Context comment:  Patient with headache since yesterday morning.  Worse migraine type headache and has had in the past.  Typically takes Tylenol and it goes away but headache has not improved. Relieved by:  Nothing Worsened by:  Nothing Ineffective treatments:  Acetaminophen Associated symptoms: no abdominal pain, no back pain, no blurred vision, no congestion, no cough, no diarrhea, no dizziness, no drainage, no ear pain, no eye pain, no facial pain, no fatigue, no fever, no focal weakness, no hearing loss, no loss of balance, no myalgias, no nausea, no near-syncope, no neck pain, no neck stiffness, no numbness, no paresthesias, no photophobia, no seizures, no sinus pressure, no sore throat, no swollen glands, no syncope, no tingling, no URI, no visual change, no vomiting and no weakness       Past Medical History:  Diagnosis Date   Aortic insufficiency    Arthritis    Asthma    Cervical disc disease    Depression    Diastolic dysfunction 8/78/6767   Hyperlipidemia 08/24/2015   Hypertension    Lumbar disc disease    Psoriasis     Patient Active Problem List   Diagnosis Date Noted   Aneurysm of ascending aorta ([place]) 06/28/2020   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   CAD (coronary artery disease) 07/09/2018   CKD (chronic kidney disease), 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   Chest pain of uncertain etiology 20/94/7096   Chronic cough 06/21/2018   Thrombocytopenia (HCC) 12/01/2016   Transaminitis  28/36/6294   Diastolic dysfunction 76/54/6503   MDD (major depressive disorder), recurrent severe, without psychosis ([place]) 10/13/2015   Hyperlipidemia 08/24/2015   Arthritis    Depression    Hypertension    Aortic insufficiency    Psoriasis    Cervical disc disease    Lumbar disc disease    Asthma 01/06/2015   Psoriatic arthritis ([place]) 01/06/2015   Allergic rhinoconjunctivitis 01/06/2015   GERD (gastroesophageal reflux disease) 01/06/2015   Laryngopharyngeal reflux (LPR) 01/06/2015    Past Surgical History:  Procedure Laterality Date   CHOLECYSTECTOMY  2012   HERNIA REPAIR     LEFT HEART CATH AND CORONARY ANGIOGRAPHY N/A 07/08/2018   Procedure: LEFT HEART CATH AND CORONARY ANGIOGRAPHY;  Surgeon: Burnell Blanks, MD;  Location: [facility];  Service: Cardiovascular;  Laterality: N/A;   PAROTIDECTOMY Right 1992   PROSTATE SURGERY  2002, 2004   [facility]     SINUS EXPLORATION  2013   SPINE SURGERY  2013   L C7-T1 laminectomy and discectomy   TONSILLECTOMY         Family History  Problem Relation Age of Onset   Colon cancer Mother    Lung cancer Father    Cancer Other    Bipolar disorder Brother    Allergic rhinitis Neg Hx    Angioedema Neg Hx    Asthma Neg Hx    Eczema Neg Hx     Social History   Tobacco Use  Smoking status: Never   Smokeless tobacco: Never  Vaping Use   Vaping Use: Never used  Substance Use Topics   Alcohol use: Yes    Alcohol/week: 0.0 standard drinks    Comment: occasional beer   Drug use: No    Home Medications Prior to Admission medications   Medication Sig Start Date End Date Taking? Authorizing Provider  albuterol (VENTOLIN HFA) 108 (90 Base) MCG/ACT inhaler INHALE 2 PUFFS INTO THE LUNGS EVERY 4 (FOUR) HOURS AS NEEDED FOR WHEEZING OR SHORTNESS OF BREATH. 07/03/20   Kennith Gain, MD  aspirin EC 81 MG tablet Take 81 mg by mouth daily.    [provider]  budesonide (RHINOCORT AQUA) 32 MCG/ACT nasal  spray Use two sprays in each nostril once daily 02/21/20   Kozlow, Donnamarie Poag, MD  budesonide-formoterol (SYMBICORT) 160-4.5 MCG/ACT inhaler Inhale 2 puffs into the lungs 2 (two) times daily.    [provider]  Cetirizine HCl (ZYRTEC ALLERGY PO) Take 1 tablet by mouth daily.    [provider]  Cholecalciferol (VITAMIN D3) 5000 units TABS Take 5,000 Units by mouth daily.     [provider]  CRANBERRY PO Take 900 mg by mouth daily.     [provider]  lamoTRIgine (LAMICTAL) 200 MG tablet Take 1 tablet (200 mg total) by mouth 2 (two) times daily. 07/30/20   Mozingo, Berdie Ogren, NP  Magnesium Oxide (MAG-OXIDE) 200 MG TABS Take 2.5 tablets (500 mg total) by mouth daily. Patient taking differently: Take 2 tablets by mouth daily. 07/26/18   Lendon Colonel, NP  metoprolol tartrate (LOPRESSOR) 25 MG tablet TAKE 1/2 TABLET TWICE A DAY Patient taking differently: Take 12.5 mg by mouth 2 (two) times daily. TAKE 1/2 TABLET TWICE A DAY 01/03/20   Skeet Latch, MD  Misc Natural Products (OSTEO BI-FLEX ADV JOINT SHIELD PO) Take 1 tablet by mouth daily.    [provider]  Multiple Vitamin (MULTIVITAMIN) capsule Take 1 capsule by mouth daily.    [provider]  nitroGLYCERIN (NITROSTAT) 0.4 MG SL tablet Place 1 tablet (0.4 mg total) under the tongue every 5 (five) minutes as needed for chest pain. 05/13/19 02/05/20  Deberah Pelton, NP  pantoprazole (PROTONIX) 40 MG tablet Take 1 tablet (40 mg total) by mouth daily. 04/04/19   Kennith Gain, MD  Probiotic Product (PROBIOTIC DAILY PO) Take 1 tablet by mouth daily.    [provider]  QUERCETIN PO Take by mouth.    [provider]  vitamin C (ASCORBIC ACID) 250 MG tablet Take 250 mg by mouth daily.    [provider]  zinc gluconate 50 MG tablet Take 1 tablet (50 mg total) by mouth daily. 07/26/18   Lendon Colonel, NP    Allergies    Atorvastatin and  Zolpidem  Review of Systems   Review of Systems  Constitutional:  Negative for chills, fatigue and fever.  HENT:  Negative for congestion, ear pain, hearing loss, postnasal drip, sinus pressure and sore throat.   Eyes:  Negative for blurred vision, photophobia, pain and visual disturbance.  Respiratory:  Negative for cough and shortness of breath.   Cardiovascular:  Negative for chest pain, palpitations, syncope and near-syncope.  Gastrointestinal:  Negative for abdominal pain, diarrhea, nausea and vomiting.  Genitourinary:  Negative for dysuria and hematuria.  Musculoskeletal:  Negative for arthralgias, back pain, myalgias, neck pain and neck stiffness.  Skin:  Negative for color change and rash.  Neurological:  Positive for headaches. Negative for dizziness, tremors, focal weakness, seizures, syncope, facial asymmetry, speech difficulty, weakness, light-headedness, numbness, paresthesias and loss of balance.  All other systems reviewed and are negative.  Physical Exam Updated Vital Signs  ED Triage Vitals  Enc Vitals Group     BP 11/08/20 1322 128/64     Pulse Rate 11/08/20 1322 (!) 59     Resp 11/08/20 1322 18     Temp 11/08/20 1322 98.2 F (36.8 C)     Temp Source 11/08/20 1322 Oral     SpO2 11/08/20 1322 97 %     Weight 11/08/20 1324 178 lb (80.7 kg)     Height 11/08/20 1324 5\' 11"  (1.803 m)     Head Circumference --      Peak Flow --      Pain Score 11/08/20 1322 8     Pain Loc --      Pain Edu? --      Excl. in [place]? --     Physical Exam Vitals and nursing note reviewed.  Constitutional:      Appearance: He is well-developed.  HENT:     Head: Normocephalic and atraumatic.     Mouth/Throat:     Mouth: Mucous membranes are moist.  Eyes:     General: No visual field deficit.    Extraocular Movements: Extraocular movements intact.     Conjunctiva/sclera: Conjunctivae normal.     Pupils: Pupils are equal, round, and reactive to light.  Cardiovascular:     Rate and  Rhythm: Normal rate and regular rhythm.     Heart sounds: Normal heart sounds. No murmur heard. Pulmonary:     Effort: Pulmonary effort is normal. No respiratory distress.     Breath sounds: Normal breath sounds.  Abdominal:     Palpations: Abdomen is soft.     Tenderness: There is no abdominal tenderness.  Musculoskeletal:        General: Normal range of motion.     Cervical back: Normal range of motion and neck supple.  Skin:    General: Skin is warm and dry.  Neurological:     Mental Status: He is alert and oriented to person, place, and time.     Cranial Nerves: No cranial nerve deficit, dysarthria or facial asymmetry.     Sensory: No sensory deficit.     Motor: No weakness.     Coordination: Coordination normal.     Comments: 5+ out of 5 strength throughout, normal sensation, no drift, normal finger-nose-finger, normal speech, no visual field loss  Psychiatric:        Mood and Affect: Mood normal.    ED Results / Procedures / Treatments   Labs (all labs ordered are listed, but only abnormal results are displayed) Labs Reviewed  CBC WITH DIFFERENTIAL/PLATELET - Abnormal; Notable for the following components:      Result Value   Platelets 145 (*)    All other components within normal limits  BASIC METABOLIC PANEL - Abnormal; Notable for the following components:   BUN 24 (*)    Creatinine, Ser 1.39 (*)    GFR, Estimated 55 (*)    All other components within normal limits    EKG None  Radiology CT Head Wo Contrast  Result Date: 11/08/2020 CLINICAL DATA:  Headache and dizziness since yesterday. EXAM: CT HEAD WITHOUT CONTRAST TECHNIQUE: Contiguous axial images were obtained from the base of the skull through the vertex without intravenous contrast. COMPARISON:  02/24/2020 FINDINGS:  Brain: No mass lesion, hemorrhage, hydrocephalus, acute infarct, intra-axial, or extra-axial fluid collection. Vascular: No hyperdense vessel or unexpected calcification. Skull: Normal  Sinuses/Orbits: Normal imaged portions of the orbits and globes. Clear paranasal sinuses and mastoid air cells. Other: None. IMPRESSION: Normal head CT. Electronically Signed   By: Abigail Miyamoto M.D.   On: 11/08/2020 14:49    Procedures Procedures   Medications Ordered in ED Medications  sodium chloride 0.9 % bolus 1,000 mL (1,000 mLs Intravenous New Bag/Given 11/08/20 1349)  prochlorperazine (COMPAZINE) injection 10 mg (10 mg Intravenous Given 11/08/20 1359)  diphenhydrAMINE (BENADRYL) injection 25 mg (25 mg Intravenous Given 11/08/20 1359)  dexamethasone (DECADRON) injection 10 mg (10 mg Intravenous Given 11/08/20 1359)    ED Course  I have reviewed the triage vital signs and the nursing notes.  Pertinent labs & imaging results that were available during my care of the patient were reviewed by me and considered in my medical decision making (see chart for details).    MDM Rules/Calculators/A&P                          Arthur Moore is a 70 year old male with history of CAD who presents to the ED with headache.  History of headaches.  Normal vitals.  No fever.  Headache slightly worse than ones that he is had in the past.  States that headache came on fairly suddenly yesterday morning and has been fairly persistently there.  Has not had much relief with Tylenol or Excedrin.  Denies any numbness, weakness, vision changes.  Mostly left frontal type headache.  Did radiate to the back of his head but that has improved.  Overall he appears comfortable and has normal neurological exam.  Will give headache cocktail with IV Benadryl, IV Compazine, IV Decadron.  Will give fluid bolus.  Will get head CT.  Have a low suspicion for subarachnoid hemorrhage given history and physical.  No concern for meningitis.  Basic labs overall unremarkable.  No significant anemia, electrolyte abnormality, leukocytosis.  Headache is completely gone after headache cocktail.  Overall discussed with patient that I have a  low concern for subarachnoid hemorrhage given that his symptoms were not consistent with that despite this being headache that is slightly worse than he had in the past.  He understands return precautions and discharged in ED in good condition.  This chart was dictated using voice recognition software.  Despite best efforts to proofread,  errors can occur which can change the documentation meaning.   Final Clinical Impression(s) / ED Diagnoses Final diagnoses:  Acute nonintractable headache, unspecified headache type    Rx / DC Orders ED Discharge Orders     None        Lennice Sites, DO 11/08/20 1511

## 2020-11-08 NOTE — ED Notes (Signed)
ED Provider at bedside. 

## 2020-11-08 NOTE — ED Notes (Signed)
Patient transported to CT 

## 2020-11-08 NOTE — ED Triage Notes (Signed)
Pt c/o sudden onset HA, dizziness yesterday am-pt states pain unrelieved with meds-NAD-to triage in w/c

## 2020-11-08 NOTE — Discharge Instructions (Addendum)
Please return to the emergency department if headache severely worsens.  Recommend continued use of Tylenol and ibuprofen for headache.

## 2020-11-23 ENCOUNTER — Ambulatory Visit (INDEPENDENT_AMBULATORY_CARE_PROVIDER_SITE_OTHER): Payer: Medicare Other

## 2020-11-23 DIAGNOSIS — J309 Allergic rhinitis, unspecified: Secondary | ICD-10-CM | POA: Diagnosis not present

## 2020-12-04 ENCOUNTER — Encounter: Payer: Self-pay | Admitting: Adult Health

## 2020-12-04 ENCOUNTER — Ambulatory Visit (INDEPENDENT_AMBULATORY_CARE_PROVIDER_SITE_OTHER): Payer: Medicare Other | Admitting: Adult Health

## 2020-12-04 ENCOUNTER — Other Ambulatory Visit: Payer: Self-pay

## 2020-12-04 DIAGNOSIS — F39 Unspecified mood [affective] disorder: Secondary | ICD-10-CM | POA: Diagnosis not present

## 2020-12-04 MED ORDER — LAMOTRIGINE 200 MG PO TABS: 200.0000 mg | ORAL_TABLET | Freq: Two times a day (BID) | ORAL | 3 refills | Status: DC

## 2020-12-04 NOTE — Progress Notes (Signed)
[name] [id number] Feb 27, 1951 70 y.o.  Subjective:   Patient ID:  Arthur Moore is a 70 y.o. (DOB 01-Jul-1950) male.  Chief Complaint: No chief complaint on file.   HPI Xavior Lorah presents to the office today for follow-up of Episodic Mood Disorder.  Describes mood today as "ok". Pleasant. Mood symptoms - denies depression, anxiety, and irritability. Denies any mood instability. Stating "I'm doing really well". Feels like the Lamictal continues to work well. He and wife doing well. 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 Stable interest and motivation. Taking medications as prescribed.  Energy levels stable. Active, has a regular exercise routine. Enjoys some usual interests and activities. Married. Lives with wife. 4 children between them. Spending time with family. Appetite adequate. Weight stable - 177 pounds. Sleeps well most nights. Averages 8.5 hours. Focus and concentration stable. Completing tasks. Managing aspects of household. Retired. [place] veteran. Denies SI or HI.  Denies AH or VH.  Previous medication trials: Lexapro, Cymbalta, Xanax, Lamictal, Wellbutrin  OTC - Melatonin   PHQ2-9    Flowsheet Row Nutrition from 06/11/2017 in [facility]  PHQ-2 Total Score 0      Flowsheet Row ED from 11/08/2020 in [facility] Error: Question 6 not populated        Review of Systems:  Review of Systems  Musculoskeletal:  Negative for gait problem.  Neurological:  Negative for tremors.  Psychiatric/Behavioral:         Please refer to HPI   Medications: I have reviewed the patient's current medications.  Current Outpatient Medications  Medication Sig Dispense Refill   albuterol (VENTOLIN HFA) 108 (90 Base) MCG/ACT inhaler INHALE 2 PUFFS INTO THE LUNGS EVERY 4 (FOUR) HOURS AS NEEDED FOR WHEEZING OR SHORTNESS OF BREATH.  6.7 each 1   aspirin EC 81 MG tablet Take 81 mg by mouth daily.     budesonide (RHINOCORT AQUA) 32 MCG/ACT nasal spray Use two sprays in each nostril once daily 8.43 mL 5   budesonide-formoterol (SYMBICORT) 160-4.5 MCG/ACT inhaler Inhale 2 puffs into the lungs 2 (two) times daily.     Cetirizine HCl (ZYRTEC ALLERGY PO) Take 1 tablet by mouth daily.     Cholecalciferol (VITAMIN D3) 5000 units TABS Take 5,000 Units by mouth daily.      CRANBERRY PO Take 900 mg by mouth daily.      lamoTRIgine (LAMICTAL) 200 MG tablet Take 1 tablet (200 mg total) by mouth 2 (two) times daily. 180 tablet 3   Magnesium Oxide (MAG-OXIDE) 200 MG TABS Take 2.5 tablets (500 mg total) by mouth daily. (Patient taking differently: Take 2 tablets by mouth daily.)  0   metoprolol tartrate (LOPRESSOR) 25 MG tablet TAKE 1/2 TABLET TWICE A DAY (Patient taking differently: Take 12.5 mg by mouth 2 (two) times daily. TAKE 1/2 TABLET TWICE A DAY) 90 tablet 3   Misc Natural Products (OSTEO BI-FLEX ADV JOINT SHIELD PO) Take 1 tablet by mouth daily.     Multiple Vitamin (MULTIVITAMIN) capsule Take 1 capsule by mouth daily.     nitroGLYCERIN (NITROSTAT) 0.4 MG SL tablet Place 1 tablet (0.4 mg total) under the tongue every 5 (five) minutes as needed for chest pain. 25 tablet 3   pantoprazole (PROTONIX) 40 MG tablet Take 1 tablet (40 mg total) by mouth daily. 90 tablet 1   Probiotic Product (PROBIOTIC DAILY PO) Take 1 tablet by mouth daily.  QUERCETIN PO Take by mouth.     vitamin C (ASCORBIC ACID) 250 MG tablet Take 250 mg by mouth daily.     zinc gluconate 50 MG tablet Take 1 tablet (50 mg total) by mouth daily.     No current facility-administered medications for this visit.    Medication Side Effects: None  Allergies:  Allergies  Allergen Reactions   Atorvastatin Other (See Comments)    Calf pain.   Zolpidem Other (See Comments)    Other reaction(s): Drowsy    Past Medical History:  Diagnosis Date   Aortic  insufficiency    Arthritis    Asthma    Cervical disc disease    Depression    Diastolic dysfunction [id number]   Hyperlipidemia 08/24/2015   Hypertension    Lumbar disc disease    Psoriasis     Past Medical History, Surgical history, Social history, and Family history were reviewed and updated as appropriate.   Please see review of systems for further details on the patient's review from today.   Objective:   Physical Exam:  There were no vitals taken for this visit.  Physical Exam Constitutional:      General: He is not in acute distress. Musculoskeletal:        General: No deformity.  Neurological:     Mental Status: He is alert and oriented to person, place, and time.     Coordination: Coordination normal.  Psychiatric:        Attention and Perception: Attention and perception normal. He does not perceive auditory or visual hallucinations.        Mood and Affect: Mood normal. Mood is not anxious or depressed. Affect is not labile, blunt, angry or inappropriate.        Speech: Speech normal.        Behavior: Behavior normal.        Thought Content: Thought content normal. Thought content is not paranoid or delusional. Thought content does not include homicidal or suicidal ideation. Thought content does not include homicidal or suicidal plan.        Cognition and Memory: Cognition and memory normal.        Judgment: Judgment normal.     Comments: Insight intact    Lab Review:     Component Value Date/Time   NA 138 11/08/2020 1350   NA 144 10/18/2020 1130   K 3.9 11/08/2020 1350   CL 106 11/08/2020 1350   CO2 24 11/08/2020 1350   GLUCOSE 87 11/08/2020 1350   BUN 24 (H) 11/08/2020 1350   BUN 20 10/18/2020 1130   CREATININE 1.39 (H) 11/08/2020 1350   CALCIUM 9.6 11/08/2020 1350   PROT 6.6 10/18/2020 1130   ALBUMIN 4.7 10/18/2020 1130   AST 20 10/18/2020 1130   ALT 21 10/18/2020 1130   ALKPHOS 89 10/18/2020 1130   BILITOT 0.7 10/18/2020 1130   GFRNONAA 55 (L)  11/08/2020 1350   GFRAA >60 01/01/2020 0324       Component Value Date/Time   WBC 6.9 11/08/2020 1350   RBC 5.19 11/08/2020 1350   HGB 15.0 11/08/2020 1350   HGB 16.7 12/09/2016 1537   HCT 43.5 11/08/2020 1350   HCT 46.9 12/09/2016 1537   PLT 145 (L) 11/08/2020 1350   PLT 141 12/09/2016 1537   MCV 83.8 11/08/2020 1350   MCV 87.3 12/09/2016 1537   MCH 28.9 11/08/2020 1350   MCHC 34.5 11/08/2020 1350   RDW 12.9 11/08/2020 1350   RDW  13.6 12/09/2016 1537   LYMPHSABS 2.3 11/08/2020 1350   LYMPHSABS 1.5 12/09/2016 1537   MONOABS 0.6 11/08/2020 1350   MONOABS 0.4 12/09/2016 1537   EOSABS 0.4 11/08/2020 1350   EOSABS 0.3 12/09/2016 1537   BASOSABS 0.0 11/08/2020 1350   BASOSABS 0.0 12/09/2016 1537    No results found for: POCLITH, LITHIUM   No results found for: PHENYTOIN, PHENOBARB, VALPROATE, CBMZ   .res Assessment: Plan:    Plan:  PDMP reviewed  1. Lamictal '200mg'$  twice daily.  RTC 6 months  Patient advised to contact office with any questions, adverse effects, or acute worsening in signs and symptoms. Counseled patient regarding potential benefits, risks, and side effects of Lamictal to include potential risk of Stevens-Johnson syndrome. Advised patient to stop taking Lamictal and contact office immediately if rash develops and to seek urgent medical attention if rash is severe and/or spreading quickly.   Diagnoses and all orders for this visit:  Episodic mood disorder (HCC) -     lamoTRIgine (LAMICTAL) 200 MG tablet; Take 1 tablet (200 mg total) by mouth 2 (two) times daily.    Please see After Visit Summary for patient specific instructions.  Future Appointments  Date Time Provider [place]  06/06/2021  9:40 AM Whitleigh Garramone, Berdie Ogren, NP CP-CP None    No orders of the defined types were placed in this encounter.   -------------------------------

## 2020-12-05 DIAGNOSIS — K760 Fatty (change of) liver, not elsewhere classified: Secondary | ICD-10-CM | POA: Diagnosis not present

## 2020-12-05 DIAGNOSIS — E559 Vitamin D deficiency, unspecified: Secondary | ICD-10-CM | POA: Diagnosis not present

## 2020-12-05 DIAGNOSIS — I10 Essential (primary) hypertension: Secondary | ICD-10-CM | POA: Diagnosis not present

## 2020-12-05 DIAGNOSIS — E7800 Pure hypercholesterolemia, unspecified: Secondary | ICD-10-CM | POA: Diagnosis not present

## 2020-12-05 DIAGNOSIS — K7460 Unspecified cirrhosis of liver: Secondary | ICD-10-CM | POA: Diagnosis not present

## 2020-12-05 DIAGNOSIS — I2510 Atherosclerotic heart disease of native coronary artery without angina pectoris: Secondary | ICD-10-CM | POA: Diagnosis not present

## 2020-12-05 DIAGNOSIS — D696 Thrombocytopenia, unspecified: Secondary | ICD-10-CM | POA: Diagnosis not present

## 2020-12-05 DIAGNOSIS — R7303 Prediabetes: Secondary | ICD-10-CM | POA: Diagnosis not present

## 2020-12-07 ENCOUNTER — Encounter (HOSPITAL_BASED_OUTPATIENT_CLINIC_OR_DEPARTMENT_OTHER): Payer: Self-pay

## 2020-12-07 ENCOUNTER — Other Ambulatory Visit: Payer: Self-pay

## 2020-12-07 ENCOUNTER — Emergency Department (HOSPITAL_BASED_OUTPATIENT_CLINIC_OR_DEPARTMENT_OTHER): Payer: Medicare Other

## 2020-12-07 ENCOUNTER — Emergency Department (HOSPITAL_BASED_OUTPATIENT_CLINIC_OR_DEPARTMENT_OTHER)
Admission: EM | Admit: 2020-12-07 | Discharge: 2020-12-07 | Disposition: A | Discharge status: Home / Self Care | Payer: Medicare Other | Attending: Emergency Medicine | Admitting: Emergency Medicine

## 2020-12-07 DIAGNOSIS — Z7982 Long term (current) use of aspirin: Secondary | ICD-10-CM | POA: Diagnosis not present

## 2020-12-07 DIAGNOSIS — W228XXA Striking against or struck by other objects, initial encounter: Secondary | ICD-10-CM | POA: Diagnosis not present

## 2020-12-07 DIAGNOSIS — Y9301 Activity, walking, marching and hiking: Secondary | ICD-10-CM | POA: Diagnosis not present

## 2020-12-07 DIAGNOSIS — Z7951 Long term (current) use of inhaled steroids: Secondary | ICD-10-CM | POA: Diagnosis not present

## 2020-12-07 DIAGNOSIS — I129 Hypertensive chronic kidney disease with stage 1 through stage 4 chronic kidney disease, or unspecified chronic kidney disease: Secondary | ICD-10-CM | POA: Insufficient documentation

## 2020-12-07 DIAGNOSIS — Z79899 Other long term (current) drug therapy: Secondary | ICD-10-CM | POA: Diagnosis not present

## 2020-12-07 DIAGNOSIS — N182 Chronic kidney disease, stage 2 (mild): Secondary | ICD-10-CM | POA: Diagnosis not present

## 2020-12-07 DIAGNOSIS — S0990XA Unspecified injury of head, initial encounter: Secondary | ICD-10-CM

## 2020-12-07 DIAGNOSIS — J45909 Unspecified asthma, uncomplicated: Secondary | ICD-10-CM | POA: Diagnosis not present

## 2020-12-07 DIAGNOSIS — R42 Dizziness and giddiness: Secondary | ICD-10-CM | POA: Diagnosis not present

## 2020-12-07 DIAGNOSIS — S0083XA Contusion of other part of head, initial encounter: Secondary | ICD-10-CM | POA: Insufficient documentation

## 2020-12-07 NOTE — Discharge Instructions (Signed)
Your CT scan of your head today was reassuring.  You are likely experiencing symptoms related to postconcussion syndrome.  Please reference the information attached on this.  Continue to monitor your symptoms closely.  If you develop any new or worsening symptoms please come back to the emergency department.  It was a pleasure to meet you both.

## 2020-12-07 NOTE — ED Provider Notes (Signed)
[facility] EMERGENCY DEPARTMENT Provider Note   CSN: [id number] Arrival date & time: 12/07/20  1211     History Chief Complaint  Patient presents with   Head Injury    Arthur Moore is a 70 y.o. male.  HPI Patient is a 70 year old male with a medical history as noted below.  He presents to the emergency department due to head trauma.  Patient states that the metal door on his circuit breaker was open and he walked into the door striking his right forehead.  This occurred around 11:30 AM.  He has been having intermittent lightheadedness as well as nausea since the incident so he decided to come into the emergency department for evaluation.  Denies any LOC.  He takes a baby aspirin once per day but is otherwise not anticoagulated.    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   Chest pain of uncertain etiology [id number]   Chronic cough 06/21/2018   Thrombocytopenia ([place]) 12/01/2016   Transaminitis [id number]   Diastolic dysfunction 0000000   MDD (major depressive disorder), recurrent severe, without psychosis ([place]) 10/13/2015   Hyperlipidemia 08/24/2015   Arthritis    Depression    Hypertension    Aortic insufficiency    Psoriasis    Cervical disc disease    Lumbar disc disease    Asthma 01/06/2015   Psoriatic arthritis ([place]) 01/06/2015   Allergic rhinoconjunctivitis 01/06/2015   GERD (gastroesophageal reflux disease) 01/06/2015   Laryngopharyngeal reflux (LPR) 01/06/2015    Past Surgical  History:  Procedure Laterality Date   CHOLECYSTECTOMY  2012   HERNIA REPAIR     LEFT HEART CATH AND CORONARY ANGIOGRAPHY N/A 07/08/2018   Procedure: LEFT HEART CATH AND CORONARY ANGIOGRAPHY;  Surgeon: Burnell Blanks, MD;  Location: [facility];  Service: Cardiovascular;  Laterality: N/A;   PAROTIDECTOMY Right 1992   PROSTATE SURGERY  2002, 2004   [facility]     SINUS EXPLORATION  2013   SPINE SURGERY  2013   L C7-T1 laminectomy and discectomy   TONSILLECTOMY         Family History  Problem Relation Age of Onset   Colon cancer Mother    Lung cancer Father    Cancer Other    Bipolar disorder Brother    Allergic rhinitis Neg Hx    Angioedema Neg Hx    Asthma Neg Hx    Eczema Neg Hx     Social History   Tobacco Use   Smoking status: Never   Smokeless tobacco: Never  Vaping Use   Vaping Use: Never used  Substance Use Topics   Alcohol use: Not Currently   Drug use: No    Home Medications Prior to Admission medications   Medication Sig Start Date End Date Taking? Authorizing Provider  albuterol (VENTOLIN HFA) 108 (90 Base) MCG/ACT inhaler INHALE 2 PUFFS INTO THE LUNGS EVERY 4 (FOUR) HOURS AS NEEDED FOR WHEEZING OR SHORTNESS OF BREATH. 07/03/20   Padgett,  Rae Halsted, MD  aspirin EC 81 MG tablet Take 81 mg by mouth daily.    [provider]  budesonide (RHINOCORT AQUA) 32 MCG/ACT nasal spray Use two sprays in each nostril once daily 02/21/20   Kozlow, Donnamarie Poag, MD  budesonide-formoterol (SYMBICORT) 160-4.5 MCG/ACT inhaler Inhale 2 puffs into the lungs 2 (two) times daily.    [provider]  Cetirizine HCl (ZYRTEC ALLERGY PO) Take 1 tablet by mouth daily.    [provider]  Cholecalciferol (VITAMIN D3) 5000 units TABS Take 5,000 Units by mouth daily.     [provider]  CRANBERRY PO Take 900 mg by mouth daily.     [provider]  lamoTRIgine (LAMICTAL) 200 MG tablet Take 1 tablet (200 mg total) by mouth 2 (two)  times daily. 12/04/20   Mozingo, Berdie Ogren, NP  Magnesium Oxide (MAG-OXIDE) 200 MG TABS Take 2.5 tablets (500 mg total) by mouth daily. Patient taking differently: Take 2 tablets by mouth daily. 07/26/18   Lendon Colonel, NP  metoprolol tartrate (LOPRESSOR) 25 MG tablet TAKE 1/2 TABLET TWICE A DAY Patient taking differently: Take 12.5 mg by mouth 2 (two) times daily. TAKE 1/2 TABLET TWICE A DAY 01/03/20   Skeet Latch, MD  Misc Natural Products (OSTEO BI-FLEX ADV JOINT SHIELD PO) Take 1 tablet by mouth daily.    [provider]  Multiple Vitamin (MULTIVITAMIN) capsule Take 1 capsule by mouth daily.    [provider]  nitroGLYCERIN (NITROSTAT) 0.4 MG SL tablet Place 1 tablet (0.4 mg total) under the tongue every 5 (five) minutes as needed for chest pain. 05/13/19 02/05/20  Deberah Pelton, NP  pantoprazole (PROTONIX) 40 MG tablet Take 1 tablet (40 mg total) by mouth daily. 04/04/19   Kennith Gain, MD  Probiotic Product (PROBIOTIC DAILY PO) Take 1 tablet by mouth daily.    [provider]  QUERCETIN PO Take by mouth.    [provider]  vitamin C (ASCORBIC ACID) 250 MG tablet Take 250 mg by mouth daily.    [provider]  zinc gluconate 50 MG tablet Take 1 tablet (50 mg total) by mouth daily. 07/26/18   Lendon Colonel, NP    Allergies    Atorvastatin and Zolpidem  Review of Systems   Review of Systems  Skin:  Positive for color change. Negative for wound.  Neurological:  Positive for headaches. Negative for syncope, weakness and numbness.   Physical Exam Updated Vital Signs BP 129/66 (BP Location: Right Arm)   Pulse 75   Temp 98.3 F (36.8 C) (Oral)   Resp 16   Ht 6' (1.829 m)   Wt 81.6 kg   SpO2 95%   BMI 24.41 kg/m   Physical Exam Vitals and nursing note reviewed.  Constitutional:      General: He is not in acute distress.    Appearance: Normal appearance. He is not ill-appearing, toxic-appearing or  diaphoretic.  HENT:     Head: Normocephalic.     Comments: Small hematoma with overlying ecchymosis noted to the right forehead.    Right Ear: External ear normal.     Left Ear: External ear normal.     Nose: Nose normal.     Mouth/Throat:     Mouth: Mucous membranes are moist.     Pharynx: Oropharynx is clear. No oropharyngeal exudate or posterior oropharyngeal erythema.  Eyes:     General: No scleral icterus.       Right  eye: No discharge.        Left eye: No discharge.     Extraocular Movements: Extraocular movements intact.     Conjunctiva/sclera: Conjunctivae normal.     Pupils: Pupils are equal, round, and reactive to light.  Neck:     Comments: No midline spine pain.  No crepitus, step-offs, or deformities. Cardiovascular:     Rate and Rhythm: Normal rate and regular rhythm.     Pulses: Normal pulses.     Heart sounds: Normal heart sounds. No murmur heard.   No friction rub. No gallop.  Pulmonary:     Effort: Pulmonary effort is normal. No respiratory distress.     Breath sounds: Normal breath sounds. No stridor. No wheezing, rhonchi or rales.  Abdominal:     General: Abdomen is flat.     Tenderness: There is no abdominal tenderness.  Musculoskeletal:        General: Normal range of motion.     Cervical back: Normal range of motion and neck supple. No tenderness.  Skin:    General: Skin is warm and dry.  Neurological:     General: No focal deficit present.     Mental Status: He is alert and oriented to person, place, and time.     Comments: Patient is oriented to person, place, and time. Patient phonates in clear, complete, and coherent sentences. Strength is 5/5 in all four extremities. Distal sensation intact in all four extremities.  Psychiatric:        Mood and Affect: Mood normal.        Behavior: Behavior normal.    ED Results / Procedures / Treatments   Labs (all labs ordered are listed, but only abnormal results are displayed) Labs Reviewed - No data to  display  EKG None  Radiology CT HEAD WO CONTRAST (5MM)  Result Date: 12/07/2020 CLINICAL DATA:  Dizziness after head injury. EXAM: CT HEAD WITHOUT CONTRAST TECHNIQUE: Contiguous axial images were obtained from the base of the skull through the vertex without intravenous contrast. COMPARISON:  November 08, 2020. FINDINGS: Brain: No evidence of acute infarction, hemorrhage, hydrocephalus, extra-axial collection or mass lesion/mass effect. Vascular: No hyperdense vessel or unexpected calcification. Skull: Normal. Negative for fracture or focal lesion. Sinuses/Orbits: No acute finding. Other: None. IMPRESSION: No acute intracranial abnormality seen. Electronically Signed   By: Marijo Conception M.D.   On: 12/07/2020 14:47    Procedures Procedures   Medications Ordered in ED Medications - No data to display  ED Course  I have reviewed the triage vital signs and the nursing notes.  Pertinent labs & imaging results that were available during my care of the patient were reviewed by me and considered in my medical decision making (see chart for details).    MDM Rules/Calculators/A&P                          Patient is a 70 year old male who presents to the emergency department due to a head injury.  Patient states he was walking in his home and the metal door was open on his circuit breaker and he accidentally struck the metal door with his right forehead.  No LOC.  Patient has a small hematoma to the right forehead but otherwise his physical exam is reassuring.  Neurological exam also reassuring.  Given the associated nausea and lightheadedness I obtained a CT scan showing no acute intracranial abnormalities.  Feel that patient is stable for discharge  at this time and he and his wife are agreeable.  Discussed symptoms associated with intracranial injury and they understand to bring him back to the emergency department if these symptoms should develop.  His questions were answered and he was amicable  at the time of discharge.  Final Clinical Impression(s) / ED Diagnoses Final diagnoses:  Injury of head, initial encounter   Rx / DC Orders ED Discharge Orders     None        Rayna Sexton, PA-C 12/07/20 1517    Arnaldo Natal, MD 12/07/20 1601

## 2020-12-07 NOTE — ED Triage Notes (Signed)
Pt states he walked into open breaker box cover ~1130am-slight hematoma to right forehead-c/o "feeling dizzy and pressure in my head"-no LOC with injury-NAD- to triage in w/c

## 2020-12-11 DIAGNOSIS — E559 Vitamin D deficiency, unspecified: Secondary | ICD-10-CM | POA: Diagnosis not present

## 2020-12-11 DIAGNOSIS — E7800 Pure hypercholesterolemia, unspecified: Secondary | ICD-10-CM | POA: Diagnosis not present

## 2020-12-11 DIAGNOSIS — R7303 Prediabetes: Secondary | ICD-10-CM | POA: Diagnosis not present

## 2020-12-11 DIAGNOSIS — I2510 Atherosclerotic heart disease of native coronary artery without angina pectoris: Secondary | ICD-10-CM | POA: Diagnosis not present

## 2020-12-11 DIAGNOSIS — K7460 Unspecified cirrhosis of liver: Secondary | ICD-10-CM | POA: Diagnosis not present

## 2020-12-11 DIAGNOSIS — K760 Fatty (change of) liver, not elsewhere classified: Secondary | ICD-10-CM | POA: Diagnosis not present

## 2020-12-11 DIAGNOSIS — D696 Thrombocytopenia, unspecified: Secondary | ICD-10-CM | POA: Diagnosis not present

## 2020-12-11 DIAGNOSIS — I10 Essential (primary) hypertension: Secondary | ICD-10-CM | POA: Diagnosis not present

## 2020-12-13 DIAGNOSIS — K31A19 Gastric intestinal metaplasia without dysplasia, unspecified site: Secondary | ICD-10-CM | POA: Diagnosis not present

## 2020-12-13 DIAGNOSIS — R1013 Epigastric pain: Secondary | ICD-10-CM | POA: Diagnosis not present

## 2020-12-13 DIAGNOSIS — K2940 Chronic atrophic gastritis without bleeding: Secondary | ICD-10-CM | POA: Diagnosis not present

## 2020-12-13 DIAGNOSIS — K2930 Chronic superficial gastritis without bleeding: Secondary | ICD-10-CM | POA: Diagnosis not present

## 2020-12-17 DIAGNOSIS — K219 Gastro-esophageal reflux disease without esophagitis: Secondary | ICD-10-CM | POA: Diagnosis not present

## 2020-12-17 DIAGNOSIS — N400 Enlarged prostate without lower urinary tract symptoms: Secondary | ICD-10-CM | POA: Diagnosis not present

## 2020-12-17 DIAGNOSIS — L4050 Arthropathic psoriasis, unspecified: Secondary | ICD-10-CM | POA: Diagnosis not present

## 2020-12-17 DIAGNOSIS — I2510 Atherosclerotic heart disease of native coronary artery without angina pectoris: Secondary | ICD-10-CM | POA: Diagnosis not present

## 2020-12-17 DIAGNOSIS — J4540 Moderate persistent asthma, uncomplicated: Secondary | ICD-10-CM | POA: Diagnosis not present

## 2020-12-17 DIAGNOSIS — E7800 Pure hypercholesterolemia, unspecified: Secondary | ICD-10-CM | POA: Diagnosis not present

## 2020-12-17 DIAGNOSIS — I10 Essential (primary) hypertension: Secondary | ICD-10-CM | POA: Diagnosis not present

## 2020-12-17 DIAGNOSIS — G4700 Insomnia, unspecified: Secondary | ICD-10-CM | POA: Diagnosis not present

## 2020-12-19 DIAGNOSIS — K2940 Chronic atrophic gastritis without bleeding: Secondary | ICD-10-CM | POA: Diagnosis not present

## 2020-12-19 DIAGNOSIS — K31A19 Gastric intestinal metaplasia without dysplasia, unspecified site: Secondary | ICD-10-CM | POA: Diagnosis not present

## 2020-12-20 ENCOUNTER — Ambulatory Visit (INDEPENDENT_AMBULATORY_CARE_PROVIDER_SITE_OTHER): Payer: Medicare Other | Admitting: *Deleted

## 2020-12-20 DIAGNOSIS — J309 Allergic rhinitis, unspecified: Secondary | ICD-10-CM

## 2020-12-28 IMAGING — MR MR LUMBAR SPINE W/O CM
4 of 5 series · 26 of 48 positions shown · non-contrast
Comparison: MRI abdomen dated April 05, 2018. MRI lumbar spine
dated December 30, 2014.

CLINICAL DATA: Low back pain radiating into the left buttock and
lateral thigh for the past 2-3 months.

EXAM:
MRI LUMBAR SPINE WITHOUT CONTRAST
TECHNIQUE: Multiplanar, multisequence MR imaging of the lumbar spine was
performed. No intravenous contrast was administered.

[Series 5: T1 · sagittal · 4.0mm · 0.57mm/px · 5 of 14 slices shown (1 of 2)]
[im 1/14]
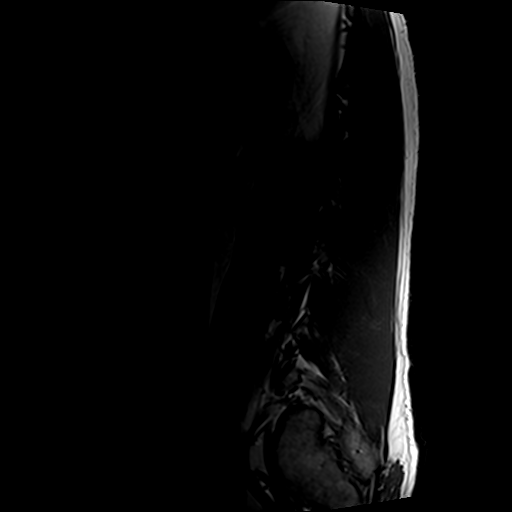
[im 4/14]
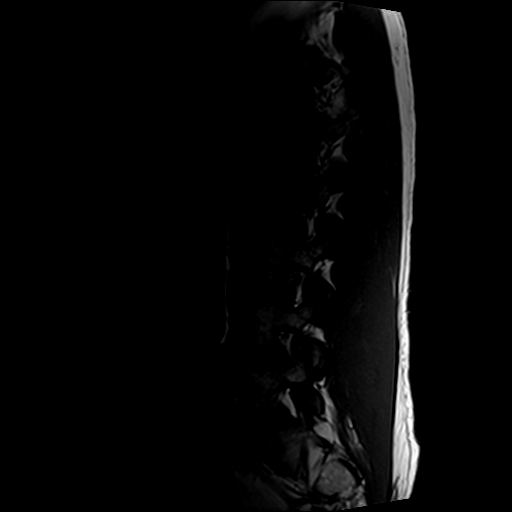
[im 7/14]
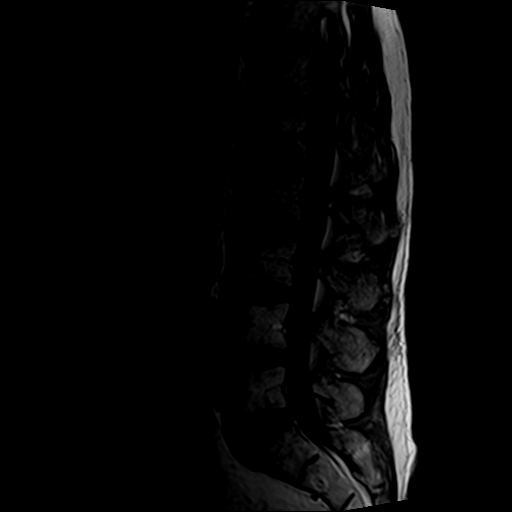
[im 10/14]
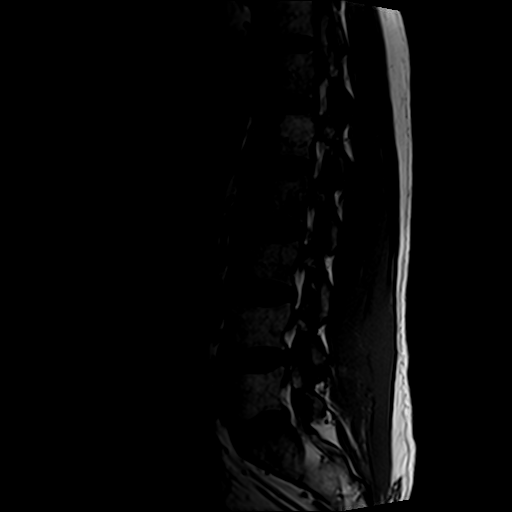
[im 14/14]
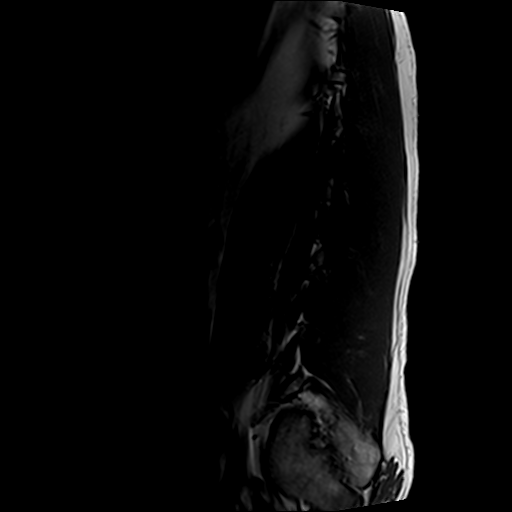

[Series 6: T2 post-contrast · sagittal · 4.0mm · 0.55mm/px · 5 of 14 slices shown]
[im 1/14]
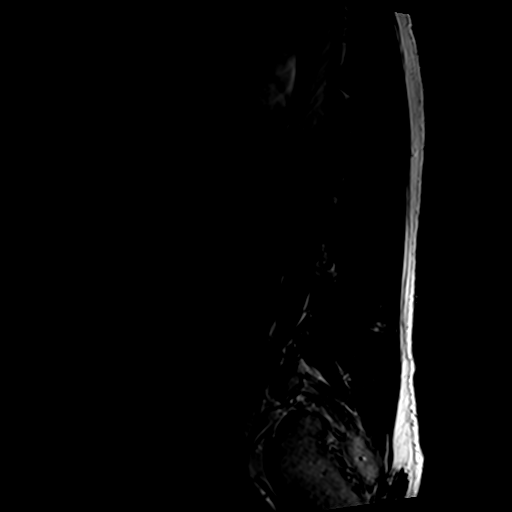
[im 4/14]
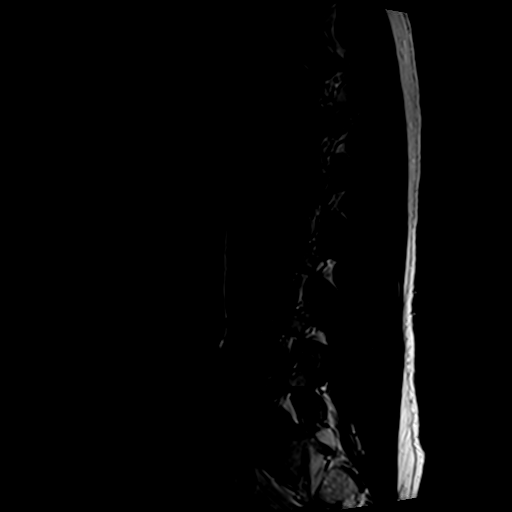
[im 7/14]
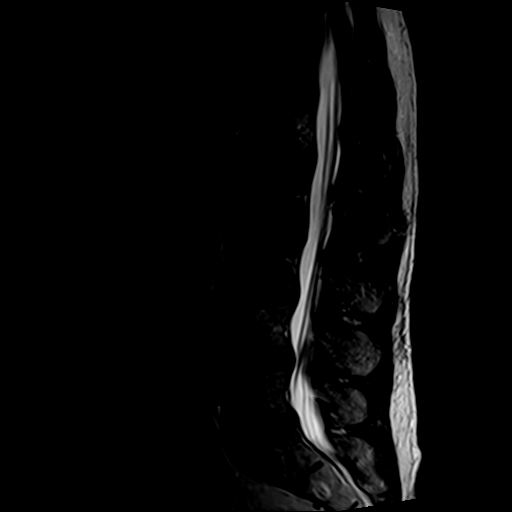
[im 10/14]
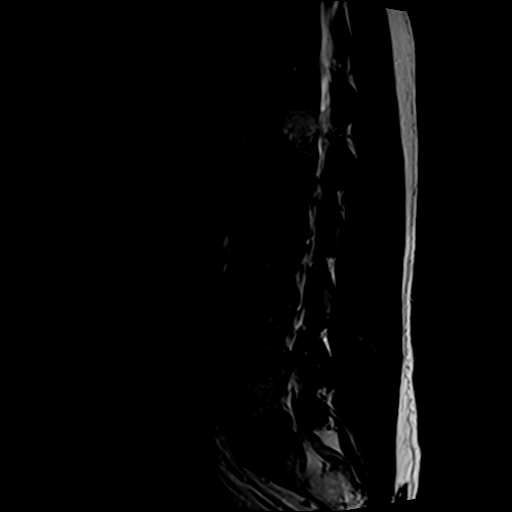
[im 14/14]
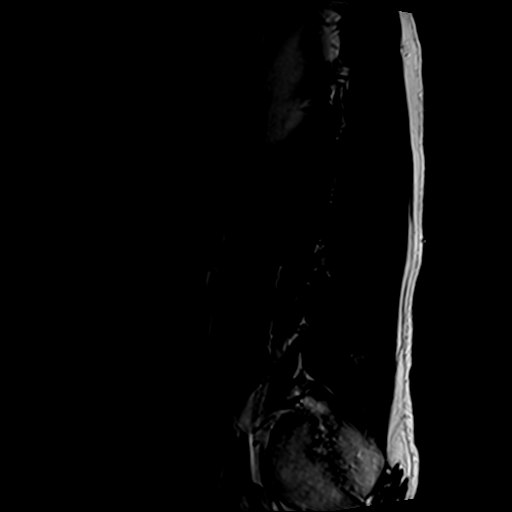

[Series 7: T2 · axial · 4.0mm · 0.70mm/px · z∈[-154,+68]mm · 11 of 50 slices shown]
[im 4/50]
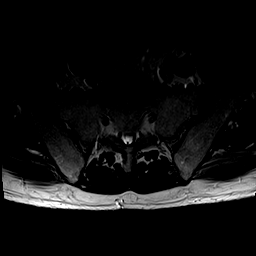
[im 7/50]
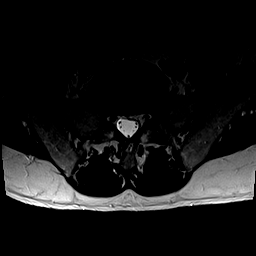
[im 10/50]
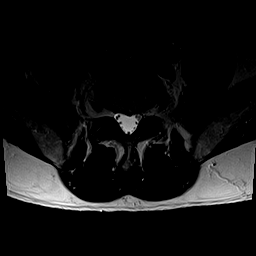
[im 16/50]
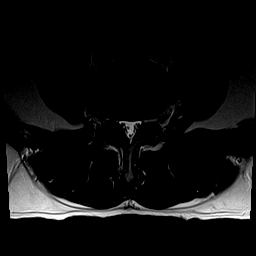
[im 22/50]
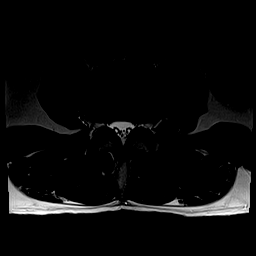
[im 25/50]
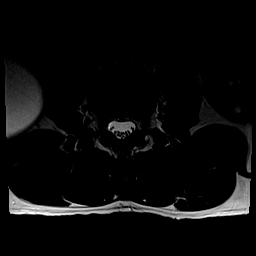
[im 28/50]
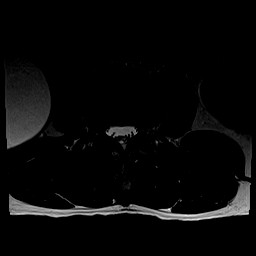
[im 34/50]
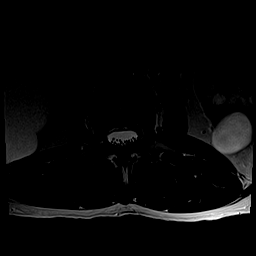
[im 40/50]
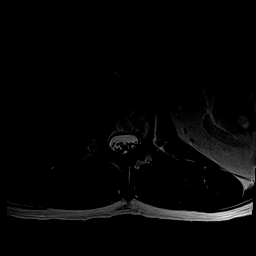
[im 43/50]
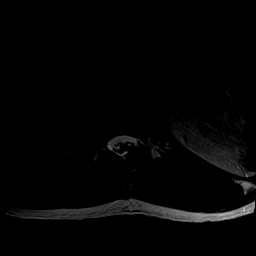
[im 46/50]
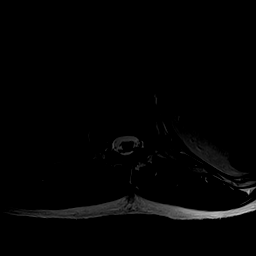

[Series 8: T1 · axial · 4.0mm · 0.35mm/px · z∈[-154,+53]mm · 5 of 50 slices shown (2 of 2)]
[im 4/50]
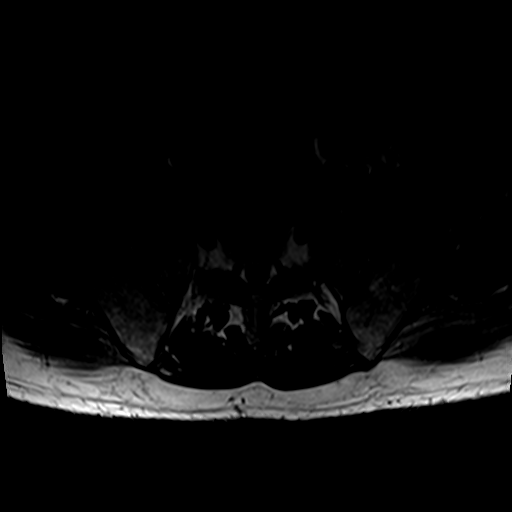
[im 7/50]
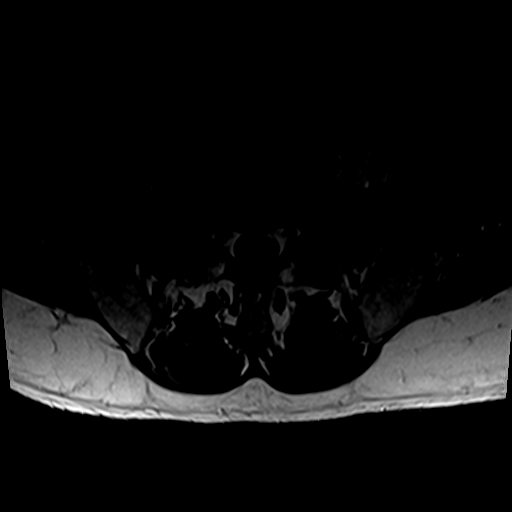
[im 10/50]
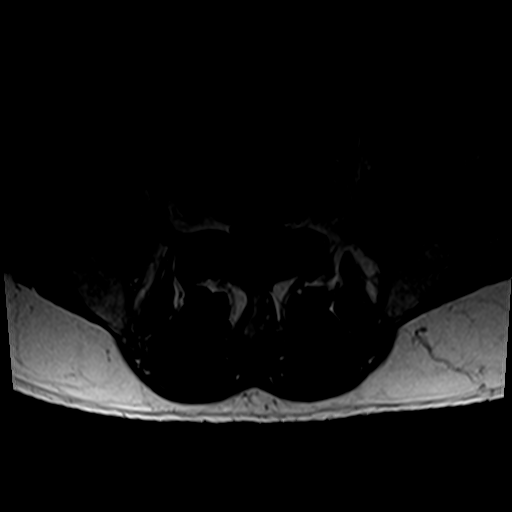
[im 25/50]
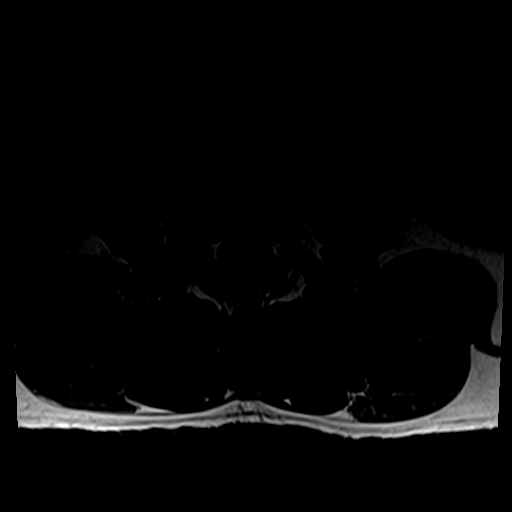
[im 43/50]
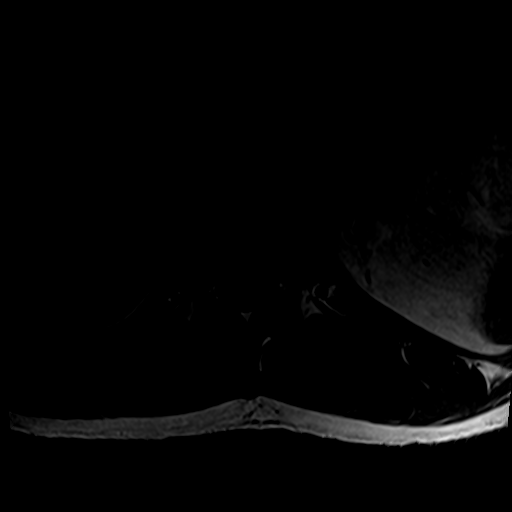

[26 of 48 positions shown; findings below may reference images not displayed]

FINDINGS: Segmentation:  Standard.

Alignment:  Physiologic.

Vertebrae: No fracture, evidence of discitis, or bone lesion.
Unchanged L1 hemangioma.

Conus medullaris and cauda equina: Conus extends to the L1 level.
Conus and cauda equina appear normal.

Paraspinal and other soft tissues: Unchanged bilateral renal cysts.
Otherwise negative.

Disc levels:

T12-L1:  Negative.

L1-L2:  Unchanged mild disc bulge.  No stenosis.

L2-L3: Unchanged mild disc bulge minimally displacing the
extraforaminal right L2 nerve root. No stenosis.

L3-L4: Unchanged mild disc bulge encroaching on the extraforaminal
bilateral L3 nerve roots. No stenosis.

L4-L5: Unchanged moderate disc bulging and mild bilateral facet
arthropathy. New left far lateral annular fissure. Unchanged mild
spinal canal and bilateral lateral recess stenosis. Unchanged mild
displacement of the extraforaminal left L4 nerve root.

L5-S1: Unchanged left subarticular disc protrusion impinging on the
descending left S1 nerve root. No spinal canal or neuroforaminal
stenosis.
IMPRESSION: 1. Unchanged left subarticular disc protrusion at L5-S1 impinging on
the descending left S1 nerve root.
2. Unchanged mild stenosis at L4-L5 and mild displacement of the
extraforaminal left L4 nerve root.

## 2021-01-20 ENCOUNTER — Encounter: Payer: Self-pay | Admitting: Allergy and Immunology

## 2021-01-21 ENCOUNTER — Ambulatory Visit: Payer: Medicare Other | Admitting: Family

## 2021-01-23 ENCOUNTER — Ambulatory Visit: Payer: Medicare Other | Admitting: Adult Health

## 2021-01-25 ENCOUNTER — Encounter: Payer: Self-pay | Admitting: Adult Health

## 2021-01-25 ENCOUNTER — Ambulatory Visit (INDEPENDENT_AMBULATORY_CARE_PROVIDER_SITE_OTHER): Payer: Medicare Other | Admitting: Adult Health

## 2021-01-25 ENCOUNTER — Other Ambulatory Visit: Payer: Self-pay

## 2021-01-25 DIAGNOSIS — I10 Essential (primary) hypertension: Secondary | ICD-10-CM | POA: Diagnosis not present

## 2021-01-25 DIAGNOSIS — G4700 Insomnia, unspecified: Secondary | ICD-10-CM | POA: Diagnosis not present

## 2021-01-25 DIAGNOSIS — J4540 Moderate persistent asthma, uncomplicated: Secondary | ICD-10-CM | POA: Diagnosis not present

## 2021-01-25 DIAGNOSIS — F411 Generalized anxiety disorder: Secondary | ICD-10-CM

## 2021-01-25 DIAGNOSIS — F418 Other specified anxiety disorders: Secondary | ICD-10-CM

## 2021-01-25 DIAGNOSIS — K219 Gastro-esophageal reflux disease without esophagitis: Secondary | ICD-10-CM | POA: Diagnosis not present

## 2021-01-25 DIAGNOSIS — N400 Enlarged prostate without lower urinary tract symptoms: Secondary | ICD-10-CM | POA: Diagnosis not present

## 2021-01-25 DIAGNOSIS — E7800 Pure hypercholesterolemia, unspecified: Secondary | ICD-10-CM | POA: Diagnosis not present

## 2021-01-25 DIAGNOSIS — I2510 Atherosclerotic heart disease of native coronary artery without angina pectoris: Secondary | ICD-10-CM | POA: Diagnosis not present

## 2021-01-25 MED ORDER — BUSPIRONE HCL 10 MG PO TABS
10.0000 mg | ORAL_TABLET | Freq: Three times a day (TID) | ORAL | 2 refills | Status: DC
Start: 2021-01-25 — End: 2021-04-04

## 2021-01-25 NOTE — Progress Notes (Signed)
Arthur Moore [id number] 1951-02-24 70 y.o.  Subjective:   Patient ID:  Arthur Moore is a 70 y.o. (DOB 1950-06-06) male.  Chief Complaint: No chief complaint on file.   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  Accompanied by wife.  Describes mood today as "ok". Pleasant. Mood symptoms - reports depression, anxiety, and irritability. Feeling more anxious overall. Symptoms worsened while recently visiting his 2 children in [place] and [place]. Stating "things did not go well". Feels like child have a lot of "animosity" towards him and he is not sure why. Has felt increasingly anxious since returning home. Having a difficult time settling his thoughts - "ruminating". Stating "it feels like it's in my stomach". Tried taking a Lorazepam, but it did not help. Denies any mood instability - feels like Lamictal continues to work well. Stable interest and motivation. Taking medications as prescribed.  Energy levels stable. Active, has a regular exercise routine. Enjoys some usual interests and activities. Married. Lives with wife. 4 children between them. Spending time with family. Appetite adequate. Weight stable - 177 pounds. Sleeps well most nights. Averages 8.5 hours. Focus and concentration stable. Completing tasks. Managing aspects of household. Retired. [employer] veteran. Denies SI or HI.  Denies AH or VH.  Previous medication trials: Lexapro, Cymbalta, Xanax, Lamictal, Wellbutrin  OTC - Melatonin    PHQ2-9    Flowsheet Row Nutrition from 06/11/2017 in [facility]  PHQ-2 Total Score 0      Flowsheet Row ED from 12/07/2020 in [facility] from 11/08/2020 in [facility] No Risk Error: Question 6 not populated        Review of Systems:  Review of Systems  Musculoskeletal:  Negative  for gait problem.  Neurological:  Negative for tremors.  Psychiatric/Behavioral:         Please refer to HPI   Medications: I have reviewed the patient's current medications.  Current Outpatient Medications  Medication Sig Dispense Refill   busPIRone (BUSPAR) 10 MG tablet Take 1 tablet (10 mg total) by mouth 3 (three) times daily. 90 tablet 2   albuterol (VENTOLIN HFA) 108 (90 Base) MCG/ACT inhaler INHALE 2 PUFFS INTO THE LUNGS EVERY 4 (FOUR) HOURS AS NEEDED FOR WHEEZING OR SHORTNESS OF BREATH. 6.7 each 1   aspirin EC 81 MG tablet Take 81 mg by mouth daily.     budesonide (RHINOCORT AQUA) 32 MCG/ACT nasal spray Use two sprays in each nostril once daily 8.43 mL 5   budesonide-formoterol (SYMBICORT) 160-4.5 MCG/ACT inhaler Inhale 2 puffs into the lungs 2 (two) times daily.     Cetirizine HCl (ZYRTEC ALLERGY PO) Take 1 tablet by mouth daily.     Cholecalciferol (VITAMIN D3) 5000 units TABS Take 5,000 Units by mouth daily.      CRANBERRY PO Take 900 mg by mouth daily.      lamoTRIgine (LAMICTAL) 200 MG tablet Take 1 tablet (200 mg total) by mouth 2 (two) times daily. 180 tablet 3   Magnesium Oxide (MAG-OXIDE) 200 MG TABS Take 2.5 tablets (500 mg total) by mouth daily. (Patient taking differently: Take 2 tablets by mouth daily.)  0   metoprolol tartrate (LOPRESSOR) 25 MG tablet TAKE 1/2 TABLET TWICE A DAY (Patient taking differently: Take 12.5 mg by mouth 2 (two) times daily. TAKE 1/2 TABLET TWICE A DAY) 90 tablet 3   Misc Natural Products (OSTEO BI-FLEX  ADV JOINT SHIELD PO) Take 1 tablet by mouth daily.     Multiple Vitamin (MULTIVITAMIN) capsule Take 1 capsule by mouth daily.     nitroGLYCERIN (NITROSTAT) 0.4 MG SL tablet Place 1 tablet (0.4 mg total) under the tongue every 5 (five) minutes as needed for chest pain. 25 tablet 3   pantoprazole (PROTONIX) 40 MG tablet Take 1 tablet (40 mg total) by mouth daily. 90 tablet 1   Probiotic Product (PROBIOTIC DAILY PO) Take 1 tablet by mouth daily.      QUERCETIN PO Take by mouth.     vitamin C (ASCORBIC ACID) 250 MG tablet Take 250 mg by mouth daily.     zinc gluconate 50 MG tablet Take 1 tablet (50 mg total) by mouth daily.     No current facility-administered medications for this visit.    Medication Side Effects: None  Allergies:  Allergies  Allergen Reactions   Atorvastatin Other (See Comments)    Calf pain.   Zolpidem Other (See Comments)    Other reaction(s): Drowsy    Past Medical History:  Diagnosis Date   Aortic insufficiency    Arthritis    Asthma    Cervical disc disease    Depression    Diastolic dysfunction 9/67/5916   Hyperlipidemia 08/24/2015   Hypertension    Lumbar disc disease    Psoriasis     Past Medical History, Surgical history, Social history, and Family history were reviewed and updated as appropriate.   Please see review of systems for further details on the patient's review from today.   Objective:   Physical Exam:  There were no vitals taken for this visit.  Physical Exam Constitutional:      General: He is not in acute distress. Musculoskeletal:        General: No deformity.  Neurological:     Mental Status: He is alert and oriented to person, place, and time.     Coordination: Coordination normal.  Psychiatric:        Attention and Perception: Attention and perception normal. He does not perceive auditory or visual hallucinations.        Mood and Affect: Mood normal. Mood is not anxious or depressed. Affect is not labile, blunt, angry or inappropriate.        Speech: Speech normal.        Behavior: Behavior normal.        Thought Content: Thought content normal. Thought content is not paranoid or delusional. Thought content does not include homicidal or suicidal ideation. Thought content does not include homicidal or suicidal plan.        Cognition and Memory: Cognition and memory normal.        Judgment: Judgment normal.     Comments: Insight intact    Lab Review:      Component Value Date/Time   NA 138 11/08/2020 1350   NA 144 10/18/2020 1130   K 3.9 11/08/2020 1350   CL 106 11/08/2020 1350   CO2 24 11/08/2020 1350   GLUCOSE 87 11/08/2020 1350   BUN 24 (H) 11/08/2020 1350   BUN 20 10/18/2020 1130   CREATININE 1.39 (H) 11/08/2020 1350   CALCIUM 9.6 11/08/2020 1350   PROT 6.6 10/18/2020 1130   ALBUMIN 4.7 10/18/2020 1130   AST 20 10/18/2020 1130   ALT 21 10/18/2020 1130   ALKPHOS 89 10/18/2020 1130   BILITOT 0.7 10/18/2020 1130   GFRNONAA 55 (L) 11/08/2020 1350   GFRAA >60 01/01/2020 0324  Component Value Date/Time   WBC 6.9 11/08/2020 1350   RBC 5.19 11/08/2020 1350   HGB 15.0 11/08/2020 1350   HGB 16.7 12/09/2016 1537   HCT 43.5 11/08/2020 1350   HCT 46.9 12/09/2016 1537   PLT 145 (L) 11/08/2020 1350   PLT 141 12/09/2016 1537   MCV 83.8 11/08/2020 1350   MCV 87.3 12/09/2016 1537   MCH 28.9 11/08/2020 1350   MCHC 34.5 11/08/2020 1350   RDW 12.9 11/08/2020 1350   RDW 13.6 12/09/2016 1537   LYMPHSABS 2.3 11/08/2020 1350   LYMPHSABS 1.5 12/09/2016 1537   MONOABS 0.6 11/08/2020 1350   MONOABS 0.4 12/09/2016 1537   EOSABS 0.4 11/08/2020 1350   EOSABS 0.3 12/09/2016 1537   BASOSABS 0.0 11/08/2020 1350   BASOSABS 0.0 12/09/2016 1537    No results found for: POCLITH, LITHIUM   No results found for: PHENYTOIN, PHENOBARB, VALPROATE, CBMZ   .res Assessment: Plan:    Plan:  PDMP reviewed  1. Lamictal 200mg  twice daily. 2. Add Buspar 10mg  TID - discussed taper onto medication.  RTC 6 months  Patient advised to contact office with any questions, adverse effects, or acute worsening in signs and symptoms. Counseled patient regarding potential benefits, risks, and side effects of Lamictal to include potential risk of Stevens-Johnson syndrome. Advised patient to stop taking Lamictal and contact office immediately if rash develops and to seek urgent medical attention if rash is severe and/or spreading quickly.   Diagnoses and  all orders for this visit:  GAD (generalized anxiety disorder) -     busPIRone (BUSPAR) 10 MG tablet; Take 1 tablet (10 mg total) by mouth 3 (three) times daily.  Situational anxiety    Please see After Visit Summary for patient specific instructions.  Future Appointments  Date Time Provider [place]  06/06/2021  9:40 AM Malloree Raboin, Berdie Ogren, NP [facility] None  07/08/2021  4:20 PM Skeet Latch, MD [facility]    No orders of the defined types were placed in this encounter.   -------------------------------

## 2021-01-31 ENCOUNTER — Other Ambulatory Visit: Payer: Self-pay | Admitting: Cardiovascular Disease

## 2021-01-31 ENCOUNTER — Ambulatory Visit (INDEPENDENT_AMBULATORY_CARE_PROVIDER_SITE_OTHER): Payer: Medicare Other | Admitting: *Deleted

## 2021-01-31 DIAGNOSIS — J309 Allergic rhinitis, unspecified: Secondary | ICD-10-CM | POA: Diagnosis not present

## 2021-02-06 DIAGNOSIS — L4050 Arthropathic psoriasis, unspecified: Secondary | ICD-10-CM | POA: Diagnosis not present

## 2021-02-06 DIAGNOSIS — G4700 Insomnia, unspecified: Secondary | ICD-10-CM | POA: Diagnosis not present

## 2021-02-06 DIAGNOSIS — E7800 Pure hypercholesterolemia, unspecified: Secondary | ICD-10-CM | POA: Diagnosis not present

## 2021-02-06 DIAGNOSIS — I2510 Atherosclerotic heart disease of native coronary artery without angina pectoris: Secondary | ICD-10-CM | POA: Diagnosis not present

## 2021-02-06 DIAGNOSIS — J4540 Moderate persistent asthma, uncomplicated: Secondary | ICD-10-CM | POA: Diagnosis not present

## 2021-02-06 DIAGNOSIS — N400 Enlarged prostate without lower urinary tract symptoms: Secondary | ICD-10-CM | POA: Diagnosis not present

## 2021-02-06 DIAGNOSIS — K219 Gastro-esophageal reflux disease without esophagitis: Secondary | ICD-10-CM | POA: Diagnosis not present

## 2021-02-06 DIAGNOSIS — I10 Essential (primary) hypertension: Secondary | ICD-10-CM | POA: Diagnosis not present

## 2021-02-11 DIAGNOSIS — J3089 Other allergic rhinitis: Secondary | ICD-10-CM | POA: Diagnosis not present

## 2021-02-11 NOTE — Progress Notes (Signed)
VIALS MADE. EXP 02-11-22

## 2021-02-12 ENCOUNTER — Encounter: Payer: Self-pay | Admitting: Allergy and Immunology

## 2021-02-12 ENCOUNTER — Telehealth: Payer: Self-pay | Admitting: *Deleted

## 2021-02-12 ENCOUNTER — Ambulatory Visit (INDEPENDENT_AMBULATORY_CARE_PROVIDER_SITE_OTHER): Payer: Medicare Other | Admitting: Allergy and Immunology

## 2021-02-12 ENCOUNTER — Other Ambulatory Visit: Payer: Self-pay

## 2021-02-12 VITALS — BP 108/60 | HR 62 | Temp 98.3°F | Resp 16 | Ht 72.0 in | Wt 180.0 lb

## 2021-02-12 DIAGNOSIS — I2510 Atherosclerotic heart disease of native coronary artery without angina pectoris: Secondary | ICD-10-CM | POA: Diagnosis not present

## 2021-02-12 DIAGNOSIS — J3089 Other allergic rhinitis: Secondary | ICD-10-CM

## 2021-02-12 DIAGNOSIS — K219 Gastro-esophageal reflux disease without esophagitis: Secondary | ICD-10-CM | POA: Diagnosis not present

## 2021-02-12 DIAGNOSIS — J4540 Moderate persistent asthma, uncomplicated: Secondary | ICD-10-CM | POA: Diagnosis not present

## 2021-02-12 DIAGNOSIS — L299 Pruritus, unspecified: Secondary | ICD-10-CM

## 2021-02-12 MED ORDER — EUCRISA 2 % EX OINT: 1.0000 "application " | TOPICAL_OINTMENT | Freq: Two times a day (BID) | CUTANEOUS | 5 refills | Status: DC | PRN

## 2021-02-12 MED ORDER — PANTOPRAZOLE SODIUM 40 MG PO TBEC: 40.0000 mg | DELAYED_RELEASE_TABLET | Freq: Every day | ORAL | 1 refills | Status: DC

## 2021-02-12 NOTE — Telephone Encounter (Signed)
Medical Records Request has been faxed to [place] at [facility] for the most recent labs for Dr. Neldon Mc.

## 2021-02-12 NOTE — Progress Notes (Signed)
[place]   Follow-up Note  Referring Provider: Vernie Shanks, MD Primary Provider: Vernie Shanks, MD Date of Office Visit: 02/12/2021  Subjective:   Arthur Moore (DOB: October 08, 1950) is a 70 y.o. male who returns to the [facility] on 02/12/2021 in re-evaluation of the following:  HPI: Arthur Moore returns to this clinic in reevaluation of asthma, allergic rhinoconjunctivitis, and LPR.  His last visit with me was 21 February 2020.  He did visit with our nurse practitioner on 25 May 2020 for an issue associated with pruritus.  Arthur Moore has done very well with his airway issue and has not required a systemic steroid or an antibiotic to treat any type of airway problem and rarely uses a short acting bronchodilator less than 1 time per week while he continues to use Symbicort on a consistent basis.  His nose has really been doing well but over the course the past week he has had some drippy nose and sneezing which is very unusual for him while he continues to use a nasal steroid and continues on immunotherapy currently at every 4 weeks without any adverse effect.  He believes that his reflux is under good control.  Uses a proton pump inhibitor and he remains free away from caffeine and chocolate consumption.  He contracted COVID in March 2022 manifested as fever and fatigue and sore throat with no long-term sequela.  He has never received a COVID-vaccine.  He still has some pruritus involving his forearms only.  He was given clobetasol which no longer helps this issue.  He has been given desonide which no longer helps this issue.  He has no associated systemic or constitutional symptoms.  This has been present at least 8 to 12 months.  He does have a history of cirrhosis with unknown etiologic factor.  Apparently had some blood test performed 1 month ago looking at the status of his liver.  Allergies as of 02/12/2021       Reactions    Atorvastatin Other (See Comments)   Calf pain.   Zolpidem Other (See Comments)   Other reaction(s): Drowsy        Medication List    albuterol 108 (90 Base) MCG/ACT inhaler Commonly known as: VENTOLIN HFA INHALE 2 PUFFS INTO THE LUNGS EVERY 4 (FOUR) HOURS AS NEEDED FOR WHEEZING OR SHORTNESS OF BREATH.   aspirin EC 81 MG tablet Take 81 mg by mouth daily.   budesonide 32 MCG/ACT nasal spray Commonly known as: RHINOCORT AQUA Use two sprays in each nostril once daily   budesonide-formoterol 160-4.5 MCG/ACT inhaler Commonly known as: SYMBICORT Inhale 2 puffs into the lungs 2 (two) times daily.   busPIRone 10 MG tablet Commonly known as: BUSPAR Take 1 tablet (10 mg total) by mouth 3 (three) times daily.   lamoTRIgine 200 MG tablet Commonly known as: LAMICTAL Take 1 tablet (200 mg total) by mouth 2 (two) times daily.   Magnesium Oxide 200 MG Tabs Commonly known as: Mag-Oxide Take 2.5 tablets (500 mg total) by mouth daily. What changed: how much to take   metoprolol tartrate 25 MG tablet Commonly known as: LOPRESSOR TAKE 1/2 TABLET BY MOUTH TWICE A DAY   multivitamin capsule Take 1 capsule by mouth daily.   nitroGLYCERIN 0.4 MG SL tablet Commonly known as: NITROSTAT Place 1 tablet (0.4 mg total) under the tongue every 5 (five) minutes as needed for chest pain.   OSTEO BI-FLEX ADV JOINT  SHIELD PO Take 1 tablet by mouth daily.   pantoprazole 40 MG tablet Commonly known as: PROTONIX Take 1 tablet (40 mg total) by mouth daily.   PROBIOTIC DAILY PO Take 1 tablet by mouth daily.   vitamin C 250 MG tablet Commonly known as: ASCORBIC ACID Take 250 mg by mouth daily.   Vitamin D3 125 MCG (5000 UT) Tabs Take 5,000 Units by mouth daily.   zinc gluconate 50 MG tablet Take 1 tablet (50 mg total) by mouth daily.   ZYRTEC ALLERGY PO Take 1 tablet by mouth daily.    Past Medical History:  Diagnosis Date   Aortic insufficiency    Arthritis    Asthma     Cervical disc disease    Depression    Diastolic dysfunction 9/50/9326   Hyperlipidemia 08/24/2015   Hypertension    Lumbar disc disease    Psoriasis     Past Surgical History:  Procedure Laterality Date   CHOLECYSTECTOMY  2012   HERNIA REPAIR     LEFT HEART CATH AND CORONARY ANGIOGRAPHY N/A 07/08/2018   Procedure: LEFT HEART CATH AND CORONARY ANGIOGRAPHY;  Surgeon: Burnell Blanks, MD;  Location: [facility];  Service: Cardiovascular;  Laterality: N/A;   PAROTIDECTOMY Right 1992   PROSTATE SURGERY  2002, 2004   [facility]     SINUS EXPLORATION  2013   SPINE SURGERY  2013   L C7-T1 laminectomy and discectomy   TONSILLECTOMY      Review of systems negative except as noted in HPI / PMHx or noted below:  Review of Systems  Constitutional: Negative.   HENT: Negative.    Eyes: Negative.   Respiratory: Negative.    Cardiovascular: Negative.   Gastrointestinal: Negative.   Genitourinary: Negative.   Musculoskeletal: Negative.   Skin: Negative.   Neurological: Negative.   Endo/Heme/Allergies: Negative.   Psychiatric/Behavioral: Negative.      Objective:   Vitals:   02/12/21 1354  BP: 108/60  Pulse: 62  Resp: 16  Temp: 98.3 F (36.8 C)  SpO2: 96%   Height: 6' (182.9 cm)  Weight: 180 lb (81.6 kg)   Physical Exam Constitutional:      Appearance: He is not diaphoretic.  HENT:     Head: Normocephalic.     Right Ear: Tympanic membrane, ear canal and external ear normal.     Left Ear: Tympanic membrane, ear canal and external ear normal.     Nose: Nose normal. No mucosal edema or rhinorrhea.     Mouth/Throat:     Pharynx: Uvula midline. No oropharyngeal exudate.  Eyes:     Conjunctiva/sclera: Conjunctivae normal.  Neck:     Thyroid: No thyromegaly.     Trachea: Trachea normal. No tracheal tenderness or tracheal deviation.  Cardiovascular:     Rate and Rhythm: Normal rate and regular rhythm.     Heart sounds: Normal heart sounds, S1 normal and S2  normal. No murmur heard. Pulmonary:     Effort: No respiratory distress.     Breath sounds: Normal breath sounds. No stridor. No wheezing or rales.  Lymphadenopathy:     Head:     Right side of head: No tonsillar adenopathy.     Left side of head: No tonsillar adenopathy.     Cervical: No cervical adenopathy.  Skin:    Findings: No erythema or rash.     Nails: There is no clubbing.  Neurological:     Mental Status: He is alert.  Diagnostics:    Spirometry was performed and demonstrated an FEV1 of 2.73 at 80 % of predicted.  The patient had an Asthma Control Test with the following results: ACT Total Score: 23.    Assessment and Plan:   1. Asthma, moderate persistent, well-controlled   2. Other allergic rhinitis   3. LPRD (laryngopharyngeal reflux disease)   4. Pruritic disorder     1. Continue Symbicort 160 - 2 inhalations 2 times a day  2. Continue nasal budesonide 1-2 sprays each nostril one time per day   3. Continue Protonix 40 mg 1 time per day  4. Continue the following if needed:   A. Zyrtec 10 mg - 1-2 tabs 1-2 times per day (MAX=40mg /day)  B. Albuterol HFA  C. Georga Hacking -apply twice a day  5.  Review recent blood test results from [facility]  6. Return to clinic in 6 months or earlier if problem  7. Obtain fall flu vaccine  Overall [name] appears to be doing very well on his current plan of anti-inflammatory agents for his airway and therapy directed against reflux as noted above.  He has pruritus involving his forearms without any specific etiologic factor.  This could be a manifestation of his cirrhosis but it does not really sound as though his cirrhosis is a particularly severe condition.  Apparently he had an ammonia level checked with his recent blood test and I will review that level.  He can use cetirizine at a dose that helps him with his pruritus but certainly does not produce any type of sedation and I have had a talk with him about appropriate  use of Zyrtec.  He can apply some Eucrisa as he does appear to have some damage of his skin from excoriation.  We will see how things go over the course the next several months.   Allena Katz, MD Allergy / Immunology [place]

## 2021-02-12 NOTE — Patient Instructions (Addendum)
  1. Continue Symbicort 160 - 2 inhalations 2 times a day  2. Continue nasal budesonide 1-2 sprays each nostril one time per day   3. Continue Protonix 40 mg 1 time per day  4. Continue the following if needed:   A. Zyrtec 10 mg - 1-2 tabs 1-2 times per day (MAX=40mg /day)  B. Albuterol HFA  C. Georga Hacking -apply twice a day  5.  Review recent blood test results from [facility]  6. Return to clinic in 6 months or earlier if problem  7. Obtain fall flu vaccine

## 2021-02-13 ENCOUNTER — Encounter: Payer: Self-pay | Admitting: Allergy and Immunology

## 2021-02-13 DIAGNOSIS — J302 Other seasonal allergic rhinitis: Secondary | ICD-10-CM | POA: Diagnosis not present

## 2021-02-14 ENCOUNTER — Telehealth: Payer: Self-pay | Admitting: Allergy and Immunology

## 2021-02-14 DIAGNOSIS — J3081 Allergic rhinitis due to animal (cat) (dog) hair and dander: Secondary | ICD-10-CM | POA: Diagnosis not present

## 2021-02-14 MED ORDER — CRISABOROLE 2 % EX OINT: 1.0000 "application " | TOPICAL_OINTMENT | Freq: Two times a day (BID) | CUTANEOUS | 5 refills | Status: DC

## 2021-02-14 NOTE — Telephone Encounter (Signed)
Patient called stating he is not able to pick up his eucrisa due to the price ($700). Patient is requesting generic be sent in for him or an alternative.   [facility] [address] [place], [place] [address]   Best contact number: [phone]

## 2021-02-14 NOTE — Telephone Encounter (Signed)
Called and informed patient that I will send in crisaborole to his pharmacy to see if they will cover the medication and be a lesser price. Patient verbalized understanding and agreed to call with any issues.

## 2021-02-18 DIAGNOSIS — R972 Elevated prostate specific antigen [PSA]: Secondary | ICD-10-CM | POA: Diagnosis not present

## 2021-02-22 DIAGNOSIS — K7460 Unspecified cirrhosis of liver: Secondary | ICD-10-CM | POA: Diagnosis not present

## 2021-02-22 DIAGNOSIS — N200 Calculus of kidney: Secondary | ICD-10-CM | POA: Diagnosis not present

## 2021-02-22 DIAGNOSIS — N281 Cyst of kidney, acquired: Secondary | ICD-10-CM | POA: Diagnosis not present

## 2021-02-22 DIAGNOSIS — K766 Portal hypertension: Secondary | ICD-10-CM | POA: Diagnosis not present

## 2021-02-25 DIAGNOSIS — Z125 Encounter for screening for malignant neoplasm of prostate: Secondary | ICD-10-CM | POA: Diagnosis not present

## 2021-02-25 DIAGNOSIS — R300 Dysuria: Secondary | ICD-10-CM | POA: Diagnosis not present

## 2021-02-25 DIAGNOSIS — N281 Cyst of kidney, acquired: Secondary | ICD-10-CM | POA: Diagnosis not present

## 2021-02-25 DIAGNOSIS — N5201 Erectile dysfunction due to arterial insufficiency: Secondary | ICD-10-CM | POA: Diagnosis not present

## 2021-02-27 ENCOUNTER — Ambulatory Visit (INDEPENDENT_AMBULATORY_CARE_PROVIDER_SITE_OTHER): Payer: Medicare Other | Admitting: Adult Health

## 2021-02-27 ENCOUNTER — Other Ambulatory Visit: Payer: Self-pay

## 2021-02-27 DIAGNOSIS — F411 Generalized anxiety disorder: Secondary | ICD-10-CM | POA: Diagnosis not present

## 2021-02-27 DIAGNOSIS — F418 Other specified anxiety disorders: Secondary | ICD-10-CM | POA: Diagnosis not present

## 2021-02-27 DIAGNOSIS — F39 Unspecified mood [affective] disorder: Secondary | ICD-10-CM | POA: Diagnosis not present

## 2021-02-27 NOTE — Progress Notes (Signed)
Arthur Moore [id number] 08-22-1950 70 y.o.  Subjective:   Patient ID:  Arthur Moore is a 70 y.o. (DOB [birth year]) male.  Chief Complaint: No chief complaint on file.   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  Accompanied by wife.  Describes mood today as "declining". Pleasant. Flat. Mood symptoms - reports depression, anxiety, and irritability. Concerns Lamictal isn't working as well as it was. Stating "my mood health is regressing". Having intrusive thoughts - more steady. Entertaining all the what if's. Ruminations. Crying when looking at wife. Irritated by noises. Feels alienated. Having panic attacks. Feels like he is out of his body looking in. Thoughts better when watching TV, praying, reading the bible, and watching TV. Looks forward to going to bed so he doesn't have to think about things. Uncomfortable in church. Does better away from stimulus. Unable to see counselor until November 23rd - "I usually listen and then dismiss them". Still awaiting records and testing from previous provider. Decreased interest and motivation. Taking medications as prescribed.  Energy levels stable. Active, has a regular exercise routine. Plays pickle ball twice a week. Enjoys some usual interests and activities. Married. Lives with wife. 4 children between them. Spending time with family. Appetite deceased - loss of appetite. Weight stable - 174 to 177 pounds. Sleeps well most nights. Averages 9 to 10 hours. Feels good and rejuvenated when awakening.  Focus and concentration stable. Completing tasks. Managing aspects of household. Retired. [employer] veteran. Denies SI or HI.  Denies AH or VH.  Previous medication trials: Lexapro, Cymbalta, Xanax, Lamictal, Wellbutrin, Effexor, Paxil, Abilify  OTC - Melatonin   PHQ2-9    Flowsheet Row Nutrition [facility] 06/11/2017 [facility]  PHQ-2  Total Score 0      Flowsheet Row ED from 12/07/2020 in [facility] from 11/08/2020 in [facility] No Risk Error: Question 6 not populated        Review of Systems:  Review of Systems  Musculoskeletal:  Negative for gait problem.  Neurological:  Negative for tremors.  Psychiatric/Behavioral:         Please refer to HPI   Medications: I have reviewed the patient's current medications.  Current Outpatient Medications  Medication Sig Dispense Refill   albuterol (VENTOLIN HFA) 108 (90 Base) MCG/ACT inhaler INHALE 2 PUFFS INTO THE LUNGS EVERY 4 (FOUR) HOURS AS NEEDED FOR WHEEZING OR SHORTNESS OF BREATH. 6.7 each 1   aspirin EC 81 MG tablet Take 81 mg by mouth daily.     budesonide (RHINOCORT AQUA) 32 MCG/ACT nasal spray Use two sprays in each nostril once daily 8.43 mL 5   budesonide-formoterol (SYMBICORT) 160-4.5 MCG/ACT inhaler Inhale 2 puffs into the lungs 2 (two) times daily.     busPIRone (BUSPAR) 10 MG tablet Take 1 tablet (10 mg total) by mouth 3 (three) times daily. 90 tablet 2   Cetirizine HCl (ZYRTEC ALLERGY PO) Take 1 tablet by mouth daily.     Cholecalciferol (VITAMIN D3) 5000 units TABS Take 5,000 Units by mouth daily.      Crisaborole (EUCRISA) 2 % OINT Apply 1 application topically 2 (two) times daily as needed. 60 g 5   Crisaborole 2 % OINT Apply 1 application topically 2 (two) times daily. 100 g 5   lamoTRIgine (LAMICTAL) 200 MG tablet Take 1 tablet (200 mg total) by mouth 2 (two) times daily. [place]  tablet 3   Magnesium Oxide (MAG-OXIDE) 200 MG TABS Take 2.5 tablets (500 mg total) by mouth daily. (Patient taking differently: Take 2 tablets by mouth daily.)  0   metoprolol tartrate (LOPRESSOR) 25 MG tablet TAKE 1/2 TABLET BY MOUTH TWICE A DAY 90 tablet 3   Misc Natural Products (OSTEO BI-FLEX ADV JOINT SHIELD PO) Take 1 tablet by mouth daily.     Multiple Vitamin (MULTIVITAMIN) capsule Take 1  capsule by mouth daily.     nitroGLYCERIN (NITROSTAT) 0.4 MG SL tablet Place 1 tablet (0.4 mg total) under the tongue every 5 (five) minutes as needed for chest pain. 25 tablet 3   pantoprazole (PROTONIX) 40 MG tablet Take 1 tablet (40 mg total) by mouth daily. 90 tablet 1   Probiotic Product (PROBIOTIC DAILY PO) Take 1 tablet by mouth daily.     vitamin C (ASCORBIC ACID) 250 MG tablet Take 250 mg by mouth daily.     zinc gluconate 50 MG tablet Take 1 tablet (50 mg total) by mouth daily.     No current facility-administered medications for this visit.    Medication Side Effects: None  Allergies:  Allergies  Allergen Reactions   Atorvastatin Other (See Comments)    Calf pain.   Zolpidem Other (See Comments)    Other reaction(s): Drowsy    Past Medical History:  Diagnosis Date   Aortic insufficiency    Arthritis    Asthma    Cervical disc disease    Depression    Diastolic dysfunction 9/76/7341   Hyperlipidemia 08/24/2015   Hypertension    Lumbar disc disease    Psoriasis     Past Medical History, Surgical history, Social history, and Family history were reviewed and updated as appropriate.   Please see review of systems for further details on the patient's review from today.   Objective:   Physical Exam:  There were no vitals taken for this visit.  Physical Exam Constitutional:      General: He is not in acute distress. Musculoskeletal:        General: No deformity.  Neurological:     Mental Status: He is alert and oriented to person, place, and time.     Coordination: Coordination normal.  Psychiatric:        Attention and Perception: Attention and perception normal. He does not perceive auditory or visual hallucinations.        Mood and Affect: Mood normal. Mood is not anxious or depressed. Affect is not labile, blunt, angry or inappropriate.        Speech: Speech normal.        Behavior: Behavior normal.        Thought Content: Thought content normal. Thought  content is not paranoid or delusional. Thought content does not include homicidal or suicidal ideation. Thought content does not include homicidal or suicidal plan.        Cognition and Memory: Cognition and memory normal.        Judgment: Judgment normal.     Comments: Insight intact    Lab Review:     Component Value Date/Time   NA 138 11/08/2020 1350   NA 144 10/18/2020 1130   K 3.9 11/08/2020 1350   CL 106 11/08/2020 1350   CO2 24 11/08/2020 1350   GLUCOSE 87 11/08/2020 1350   BUN 24 (H) 11/08/2020 1350   BUN 20 10/18/2020 1130   CREATININE 1.39 (H) 11/08/2020 1350   CALCIUM 9.6 11/08/2020 1350  PROT 6.6 10/18/2020 1130   ALBUMIN 4.7 10/18/2020 1130   AST 20 10/18/2020 1130   ALT 21 10/18/2020 1130   ALKPHOS 89 10/18/2020 1130   BILITOT 0.7 10/18/2020 1130   GFRNONAA 55 (L) 11/08/2020 1350   GFRAA >60 01/01/2020 0324       Component Value Date/Time   WBC 6.9 11/08/2020 1350   RBC 5.19 11/08/2020 1350   HGB 15.0 11/08/2020 1350   HGB 16.7 12/09/2016 1537   HCT 43.5 11/08/2020 1350   HCT 46.9 12/09/2016 1537   PLT 145 (L) 11/08/2020 1350   PLT 141 12/09/2016 1537   MCV 83.8 11/08/2020 1350   MCV 87.3 12/09/2016 1537   MCH 28.9 11/08/2020 1350   MCHC 34.5 11/08/2020 1350   RDW 12.9 11/08/2020 1350   RDW 13.6 12/09/2016 1537   LYMPHSABS 2.3 11/08/2020 1350   LYMPHSABS 1.5 12/09/2016 1537   MONOABS 0.6 11/08/2020 1350   MONOABS 0.4 12/09/2016 1537   EOSABS 0.4 11/08/2020 1350   EOSABS 0.3 12/09/2016 1537   BASOSABS 0.0 11/08/2020 1350   BASOSABS 0.0 12/09/2016 1537    No results found for: POCLITH, LITHIUM   No results found for: PHENYTOIN, PHENOBARB, VALPROATE, CBMZ   .res Assessment: Plan:    Plan:  PDMP reviewed  1. Lamictal 200mg  twice daily. 2. Increase Buspar 10mg  TID - 15mg  TID discussed taper onto medication.  RTC 4 weeks  Will do [facility] testing   Awaiting previous records.  Patient advised to contact office with any questions,  adverse effects, or acute worsening in signs and symptoms. Counseled patient regarding potential benefits, risks, and side effects of Lamictal to include potential risk of Stevens-Johnson syndrome. Advised patient to stop taking Lamictal and contact office immediately if rash develops and to seek urgent medical attention if rash is severe and/or spreading quickly.   Diagnoses and all orders for this visit:  Situational anxiety  Episodic mood disorder ([place])    Please see After Visit Summary for patient specific instructions.  Future Appointments  Date Time Provider [place]  06/06/2021  9:40 AM Scot Shiraishi, Berdie Ogren, NP [facility] None  07/08/2021  4:20 PM Skeet Latch, MD [facility]    No orders of the defined types were placed in this encounter.   -------------------------------

## 2021-02-28 ENCOUNTER — Encounter: Payer: Self-pay | Admitting: Adult Health

## 2021-03-11 ENCOUNTER — Encounter: Payer: Self-pay | Admitting: Adult Health

## 2021-03-11 ENCOUNTER — Ambulatory Visit (INDEPENDENT_AMBULATORY_CARE_PROVIDER_SITE_OTHER): Payer: Medicare Other | Admitting: Adult Health

## 2021-03-11 ENCOUNTER — Other Ambulatory Visit: Payer: Self-pay

## 2021-03-11 DIAGNOSIS — F418 Other specified anxiety disorders: Secondary | ICD-10-CM

## 2021-03-11 DIAGNOSIS — F411 Generalized anxiety disorder: Secondary | ICD-10-CM | POA: Diagnosis not present

## 2021-03-11 DIAGNOSIS — F39 Unspecified mood [affective] disorder: Secondary | ICD-10-CM | POA: Diagnosis not present

## 2021-03-11 DIAGNOSIS — F4310 Post-traumatic stress disorder, unspecified: Secondary | ICD-10-CM | POA: Diagnosis not present

## 2021-03-11 NOTE — Progress Notes (Signed)
Arthur Moore [id number] January 29, 1951 70 y.o.  Subjective:   Patient ID:  Arthur Moore is a 70 y.o. (DOB Dec 26, 1950) male.  Chief Complaint: No chief complaint on file.   HPI Sherod Cisse presents to the office today for follow-up of Episodic Mood Disorder, PTSD, depression and situational anxiety.  Accompanied by wife.  Describes mood today as "about the same". Pleasant. Flat. Tearful at times - "I cry pretty easily". Mood symptoms - reports some depression, anxiety, and irritability. Reports intrusive thoughts. Denies recent panic attacks. Reports some symptom improvement with increase in Buspar. Wife feels the increase in Buspar has "softened" him some. Had a few days of increased energy and then felt depressed. Doing high energy projects and will not stop until he is done. Feels like his PTSD is also adding to his decline in mood. Trying to push himself to keep from thinking about the negative things he's experienced. Decreased interest and motivation. Taking medications as prescribed.  Energy levels stable. Active, has a regular exercise routine. Plays pickle ball twice a week. Enjoys some usual interests and activities. Married. Lives with wife. 4 children between them. Spending time with family. Appetite deceased - loss of appetite. Weight stable - 174 to 177 pounds. Sleeps well most nights. Averages 9 to 10 hours. Taking a daytime nap - 1 hour. Focus and concentration stable. Completing tasks. Managing aspects of household. Retired. [place] veteran. Denies SI or HI.  Denies AH or VH.  Previous medication trials: Lexapro, Cymbalta, Xanax, Lamictal, Wellbutrin, Effexor, Paxil, Abilify  OTC - Melatonin  PHQ2-9    Flowsheet Row [facility]  PHQ-2 Total Score 0      Flowsheet Row ED from 12/07/2020 in [facility] from 11/08/2020 in [facility] No Risk Error: Question 6 not populated        Review of Systems:  Review of Systems  Musculoskeletal:  Negative for gait problem.  Neurological:  Negative for tremors.  Psychiatric/Behavioral:         Please refer to HPI   Medications: I have reviewed the patient's current medications.  Current Outpatient Medications  Medication Sig Dispense Refill   albuterol (VENTOLIN HFA) 108 (90 Base) MCG/ACT inhaler INHALE 2 PUFFS INTO THE LUNGS EVERY 4 (FOUR) HOURS AS NEEDED FOR WHEEZING OR SHORTNESS OF BREATH. 6.7 each 1   aspirin EC 81 MG tablet Take 81 mg by mouth daily.     budesonide (RHINOCORT AQUA) 32 MCG/ACT nasal spray Use two sprays in each nostril once daily 8.43 mL 5   budesonide-formoterol (SYMBICORT) 160-4.5 MCG/ACT inhaler Inhale 2 puffs into the lungs 2 (two) times daily.     busPIRone (BUSPAR) 10 MG tablet Take 1 tablet (10 mg total) by mouth 3 (three) times daily. 90 tablet 2   Cetirizine HCl (ZYRTEC ALLERGY PO) Take 1 tablet by mouth daily.     Cholecalciferol (VITAMIN D3) 5000 units TABS Take 5,000 Units by mouth daily.      Crisaborole (EUCRISA) 2 % OINT Apply 1 application topically 2 (two) times daily as needed. 60 g 5   Crisaborole 2 % OINT Apply 1 application topically 2 (two) times daily. 100 g 5   lamoTRIgine (LAMICTAL) 200 MG tablet Take 1 tablet (200 mg total) by mouth 2 (two) times daily. 180 tablet 3   Magnesium Oxide (MAG-OXIDE) 200 MG TABS Take 2.5 tablets (500 mg total) by mouth daily. (Patient taking  differently: Take 2 tablets by mouth daily.)  0   metoprolol tartrate (LOPRESSOR) 25 MG tablet TAKE 1/2 TABLET BY MOUTH TWICE A DAY 90 tablet 3   Misc Natural Products (OSTEO BI-FLEX ADV JOINT SHIELD PO) Take 1 tablet by mouth daily.     Multiple Vitamin (MULTIVITAMIN) capsule Take 1 capsule by mouth daily.     nitroGLYCERIN (NITROSTAT) 0.4 MG SL tablet Place 1 tablet (0.4 mg total) under the tongue every 5 (five) minutes as needed for chest  pain. 25 tablet 3   pantoprazole (PROTONIX) 40 MG tablet Take 1 tablet (40 mg total) by mouth daily. 90 tablet 1   Probiotic Product (PROBIOTIC DAILY PO) Take 1 tablet by mouth daily.     vitamin C (ASCORBIC ACID) 250 MG tablet Take 250 mg by mouth daily.     zinc gluconate 50 MG tablet Take 1 tablet (50 mg total) by mouth daily.     No current facility-administered medications for this visit.    Medication Side Effects: None  Allergies:  Allergies  Allergen Reactions   Atorvastatin Other (See Comments)    Calf pain.   Zolpidem Other (See Comments)    Other reaction(s): Drowsy    Past Medical History:  Diagnosis Date   Aortic insufficiency    Arthritis    Asthma    Cervical disc disease    Depression    Diastolic dysfunction 5/36/6440   Hyperlipidemia 08/24/2015   Hypertension    Lumbar disc disease    Psoriasis     Past Medical History, Surgical history, Social history, and Family history were reviewed and updated as appropriate.   Please see review of systems for further details on the patient's review from today.   Objective:   Physical Exam:  There were no vitals taken for this visit.  Physical Exam Constitutional:      General: He is not in acute distress. Musculoskeletal:        General: No deformity.  Neurological:     Mental Status: He is alert and oriented to person, place, and time.     Coordination: Coordination normal.  Psychiatric:        Attention and Perception: Attention and perception normal. He does not perceive auditory or visual hallucinations.        Mood and Affect: Mood normal. Mood is not anxious or depressed. Affect is not labile, blunt, angry or inappropriate.        Speech: Speech normal.        Behavior: Behavior normal.        Thought Content: Thought content normal. Thought content is not paranoid or delusional. Thought content does not include homicidal or suicidal ideation. Thought content does not include homicidal or suicidal  plan.        Cognition and Memory: Cognition and memory normal.        Judgment: Judgment normal.     Comments: Insight intact    Lab Review:     Component Value Date/Time   NA 138 11/08/2020 1350   NA 144 10/18/2020 1130   K 3.9 11/08/2020 1350   CL 106 11/08/2020 1350   CO2 24 11/08/2020 1350   GLUCOSE 87 11/08/2020 1350   BUN 24 (H) 11/08/2020 1350   BUN 20 10/18/2020 1130   CREATININE 1.39 (H) 11/08/2020 1350   CALCIUM 9.6 11/08/2020 1350   PROT 6.6 10/18/2020 1130   ALBUMIN 4.7 10/18/2020 1130   AST 20 10/18/2020 1130   ALT 21 10/18/2020  1130   ALKPHOS 89 10/18/2020 1130   BILITOT 0.7 10/18/2020 1130   GFRNONAA 55 (L) 11/08/2020 1350   GFRAA >60 01/01/2020 0324       Component Value Date/Time   WBC 6.9 11/08/2020 1350   RBC 5.19 11/08/2020 1350   HGB 15.0 11/08/2020 1350   HGB 16.7 12/09/2016 1537   HCT 43.5 11/08/2020 1350   HCT 46.9 12/09/2016 1537   PLT 145 (L) 11/08/2020 1350   PLT 141 12/09/2016 1537   MCV 83.8 11/08/2020 1350   MCV 87.3 12/09/2016 1537   MCH 28.9 11/08/2020 1350   MCHC 34.5 11/08/2020 1350   RDW 12.9 11/08/2020 1350   RDW 13.6 12/09/2016 1537   LYMPHSABS 2.3 11/08/2020 1350   LYMPHSABS 1.5 12/09/2016 1537   MONOABS 0.6 11/08/2020 1350   MONOABS 0.4 12/09/2016 1537   EOSABS 0.4 11/08/2020 1350   EOSABS 0.3 12/09/2016 1537   BASOSABS 0.0 11/08/2020 1350   BASOSABS 0.0 12/09/2016 1537    No results found for: POCLITH, LITHIUM   No results found for: PHENYTOIN, PHENOBARB, VALPROATE, CBMZ   .res Assessment: Plan:     Plan:  PDMP reviewed  1. Lamictal 200mg  twice daily. 2. Increase Buspar 15mg  TID - may increased to 20mg  TID   3. Add Latuda 20mg  daily - samples given  RTC 4 weeks  Patient advised to contact office with any questions, adverse effects, or acute worsening in signs and symptoms. Counseled patient regarding potential benefits, risks, and side effects of Lamictal to include potential risk of Stevens-Johnson  syndrome. Advised patient to stop taking Lamictal and contact office immediately if rash develops and to seek urgent medical attention if rash is severe and/or spreading quickly.   Diagnoses and all orders for this visit:  Situational anxiety  Episodic mood disorder (HCC)  GAD (generalized anxiety disorder)  Posttraumatic stress disorder with delayed expression    Please see After Visit Summary for patient specific instructions.  Future Appointments  Date Time Provider [place]  04/08/2021 11:40 AM Bernon Arviso, Berdie Ogren, NP [facility] None  06/06/2021  9:40 AM Margeart Allender, Berdie Ogren, NP [facility] None  07/08/2021  4:20 PM Skeet Latch, MD [facility]    No orders of the defined types were placed in this encounter.   -------------------------------

## 2021-03-13 ENCOUNTER — Encounter: Payer: Self-pay | Admitting: Adult Health

## 2021-03-19 ENCOUNTER — Ambulatory Visit (INDEPENDENT_AMBULATORY_CARE_PROVIDER_SITE_OTHER): Payer: Medicare Other

## 2021-03-19 DIAGNOSIS — J309 Allergic rhinitis, unspecified: Secondary | ICD-10-CM

## 2021-04-04 ENCOUNTER — Other Ambulatory Visit: Payer: Self-pay | Admitting: Adult Health

## 2021-04-04 DIAGNOSIS — F411 Generalized anxiety disorder: Secondary | ICD-10-CM

## 2021-04-08 ENCOUNTER — Encounter: Payer: Self-pay | Admitting: Adult Health

## 2021-04-08 ENCOUNTER — Ambulatory Visit (INDEPENDENT_AMBULATORY_CARE_PROVIDER_SITE_OTHER): Payer: Medicare Other | Admitting: Adult Health

## 2021-04-08 ENCOUNTER — Other Ambulatory Visit: Payer: Self-pay

## 2021-04-08 DIAGNOSIS — F4310 Post-traumatic stress disorder, unspecified: Secondary | ICD-10-CM

## 2021-04-08 DIAGNOSIS — F411 Generalized anxiety disorder: Secondary | ICD-10-CM | POA: Diagnosis not present

## 2021-04-08 DIAGNOSIS — F39 Unspecified mood [affective] disorder: Secondary | ICD-10-CM | POA: Diagnosis not present

## 2021-04-08 DIAGNOSIS — F418 Other specified anxiety disorders: Secondary | ICD-10-CM

## 2021-04-08 NOTE — Progress Notes (Signed)
Arthur Moore [id number] [birth year] 70 y.o.  Subjective:   Patient ID:  Arthur Moore is a 70 y.o. (DOB 1950-06-02) male.  Chief Complaint: No chief complaint on file.   HPI Arthur Moore presents to the office today for follow-up of Episodic Mood Disorder, PTSD, depression and situational anxiety.  Accompanied by wife.  Describes mood today as "a little better". Pleasant. Flat. Tearful at times - "worries about losing wife". Reports worry and intrusive thoughts. Stating "I had a few bad days over the past few weeks - perseverating and found it difficult to get out of bed."  Mood symptoms - reports decreased depression, anxiety, and irritability. Has cut back on activities that were "overwhelming". Denies recent panic attacks. Stating "the butterflies are a little better". Reports some symptom improvement with addition of Latuda and increase in Buspar. Improved interest and motivation. Taking medications as prescribed.  Energy levels stable. Active, has a regular exercise routine - restarted.  Enjoys some usual interests and activities. Married. Lives with wife. 4 children between them. Spending time with family. Appetite stable. Weight stable - 175 to 180 pounds. Sleeps well most nights. Averages 9 to 10 hours. Has days he sleeps 14 to 15 hours a day.  Focus and concentration stable. Completing tasks. Managing aspects of household. Retired. [place] veteran. Denies SI or HI.  Denies AH or VH.  Previous medication trials: Lexapro, Cymbalta, Xanax, Lamictal, Wellbutrin, Effexor, Paxil, Abilify  OTC - Melatonin   PHQ2-9    Flowsheet Row Nutrition [facility]  PHQ-2 Total Score 0      Flowsheet Row ED from 12/07/2020 in [facility] from 11/08/2020 in [facility] No Risk Error: Question 6 not populated        Review of Systems:  Review of  Systems  Musculoskeletal:  Negative for gait problem.  Neurological:  Negative for tremors.  Psychiatric/Behavioral:         Please refer to HPI   Medications: I have reviewed the patient's current medications.  Current Outpatient Medications  Medication Sig Dispense Refill   albuterol (VENTOLIN HFA) 108 (90 Base) MCG/ACT inhaler INHALE 2 PUFFS INTO THE LUNGS EVERY 4 (FOUR) HOURS AS NEEDED FOR WHEEZING OR SHORTNESS OF BREATH. 6.7 each 1   aspirin EC 81 MG tablet Take 81 mg by mouth daily.     budesonide (RHINOCORT AQUA) 32 MCG/ACT nasal spray Use two sprays in each nostril once daily 8.43 mL 5   budesonide-formoterol (SYMBICORT) 160-4.5 MCG/ACT inhaler Inhale 2 puffs into the lungs 2 (two) times daily.     busPIRone (BUSPAR) 10 MG tablet TAKE ONE TABLET BY MOUTH THREE TIMES A DAY 90 tablet 0   Cetirizine HCl (ZYRTEC ALLERGY PO) Take 1 tablet by mouth daily.     Cholecalciferol (VITAMIN D3) 5000 units TABS Take 5,000 Units by mouth daily.      Crisaborole (EUCRISA) 2 % OINT Apply 1 application topically 2 (two) times daily as needed. 60 g 5   Crisaborole 2 % OINT Apply 1 application topically 2 (two) times daily. 100 g 5   lamoTRIgine (LAMICTAL) 200 MG tablet Take 1 tablet (200 mg total) by mouth 2 (two) times daily. 180 tablet 3   Magnesium Oxide (MAG-OXIDE) 200 MG TABS Take 2.5 tablets (500 mg total) by mouth daily. (Patient taking differently: Take 2 tablets by mouth daily.)  0   metoprolol tartrate (LOPRESSOR) 25 MG tablet TAKE 1/2  TABLET BY MOUTH TWICE A DAY 90 tablet 3   Misc Natural Products (OSTEO BI-FLEX ADV JOINT SHIELD PO) Take 1 tablet by mouth daily.     Multiple Vitamin (MULTIVITAMIN) capsule Take 1 capsule by mouth daily.     nitroGLYCERIN (NITROSTAT) 0.4 MG SL tablet Place 1 tablet (0.4 mg total) under the tongue every 5 (five) minutes as needed for chest pain. 25 tablet 3   pantoprazole (PROTONIX) 40 MG tablet Take 1 tablet (40 mg total) by mouth daily. 90 tablet 1    Probiotic Product (PROBIOTIC DAILY PO) Take 1 tablet by mouth daily.     vitamin C (ASCORBIC ACID) 250 MG tablet Take 250 mg by mouth daily.     zinc gluconate 50 MG tablet Take 1 tablet (50 mg total) by mouth daily.     No current facility-administered medications for this visit.    Medication Side Effects: None  Allergies:  Allergies  Allergen Reactions   Atorvastatin Other (See Comments)    Calf pain.   Zolpidem Other (See Comments)    Other reaction(s): Drowsy    Past Medical History:  Diagnosis Date   Aortic insufficiency    Arthritis    Asthma    Cervical disc disease    Depression    Diastolic dysfunction 2/59/5638   Hyperlipidemia 08/24/2015   Hypertension    Lumbar disc disease    Psoriasis     Past Medical History, Surgical history, Social history, and Family history were reviewed and updated as appropriate.   Please see review of systems for further details on the patient's review from today.   Objective:   Physical Exam:  There were no vitals taken for this visit.  Physical Exam  Lab Review:     Component Value Date/Time   NA 138 11/08/2020 1350   NA 144 10/18/2020 1130   K 3.9 11/08/2020 1350   CL 106 11/08/2020 1350   CO2 24 11/08/2020 1350   GLUCOSE 87 11/08/2020 1350   BUN 24 (H) 11/08/2020 1350   BUN 20 10/18/2020 1130   CREATININE 1.39 (H) 11/08/2020 1350   CALCIUM 9.6 11/08/2020 1350   PROT 6.6 10/18/2020 1130   ALBUMIN 4.7 10/18/2020 1130   AST 20 10/18/2020 1130   ALT 21 10/18/2020 1130   ALKPHOS 89 10/18/2020 1130   BILITOT 0.7 10/18/2020 1130   GFRNONAA 55 (L) 11/08/2020 1350   GFRAA >60 01/01/2020 0324       Component Value Date/Time   WBC 6.9 11/08/2020 1350   RBC 5.19 11/08/2020 1350   HGB 15.0 11/08/2020 1350   HGB 16.7 12/09/2016 1537   HCT 43.5 11/08/2020 1350   HCT 46.9 12/09/2016 1537   PLT 145 (L) 11/08/2020 1350   PLT 141 12/09/2016 1537   MCV 83.8 11/08/2020 1350   MCV 87.3 12/09/2016 1537   MCH 28.9  11/08/2020 1350   MCHC 34.5 11/08/2020 1350   RDW 12.9 11/08/2020 1350   RDW 13.6 12/09/2016 1537   LYMPHSABS 2.3 11/08/2020 1350   LYMPHSABS 1.5 12/09/2016 1537   MONOABS 0.6 11/08/2020 1350   MONOABS 0.4 12/09/2016 1537   EOSABS 0.4 11/08/2020 1350   EOSABS 0.3 12/09/2016 1537   BASOSABS 0.0 11/08/2020 1350   BASOSABS 0.0 12/09/2016 1537    No results found for: POCLITH, LITHIUM   No results found for: PHENYTOIN, PHENOBARB, VALPROATE, CBMZ   .res Assessment: Plan:     Plan:  PDMP reviewed  1. Lamictal 200mg  twice daily. 2. Buspar  15mg  TID - 20mg  TID   3. Increase Latuda 20mg  daily - samples given  RTC 4 weeks  Patient advised to contact office with any questions, adverse effects, or acute worsening in signs and symptoms. Counseled patient regarding potential benefits, risks, and side effects of Lamictal to include potential risk of Stevens-Johnson syndrome. Advised patient to stop taking Lamictal and contact office immediately if rash develops and to seek urgent medical attention if rash is severe and/or spreading quickly.  Diagnoses and all orders for this visit:  Situational anxiety  Episodic mood disorder (HCC)  GAD (generalized anxiety disorder)  Posttraumatic stress disorder with delayed expression    Please see After Visit Summary for patient specific instructions.  Future Appointments  Date Time Provider [place]  05/06/2021 10:20 AM Kolbee Bogusz, Berdie Ogren, NP [facility] None  06/06/2021  9:40 AM Jessiah Wojnar, Berdie Ogren, NP [facility] None  07/08/2021  4:20 PM Skeet Latch, MD [facility]    No orders of the defined types were placed in this encounter.   -------------------------------

## 2021-04-14 ENCOUNTER — Emergency Department (HOSPITAL_BASED_OUTPATIENT_CLINIC_OR_DEPARTMENT_OTHER)
Admission: EM | Admit: 2021-04-14 | Discharge: 2021-04-14 | Disposition: A | Discharge status: Home / Self Care | Payer: Medicare Other | Attending: Emergency Medicine | Admitting: Emergency Medicine

## 2021-04-14 ENCOUNTER — Other Ambulatory Visit: Payer: Self-pay

## 2021-04-14 ENCOUNTER — Emergency Department (HOSPITAL_BASED_OUTPATIENT_CLINIC_OR_DEPARTMENT_OTHER): Payer: Medicare Other

## 2021-04-14 ENCOUNTER — Encounter (HOSPITAL_BASED_OUTPATIENT_CLINIC_OR_DEPARTMENT_OTHER): Payer: Self-pay | Admitting: Emergency Medicine

## 2021-04-14 DIAGNOSIS — I2510 Atherosclerotic heart disease of native coronary artery without angina pectoris: Secondary | ICD-10-CM | POA: Diagnosis not present

## 2021-04-14 DIAGNOSIS — Z79899 Other long term (current) drug therapy: Secondary | ICD-10-CM | POA: Diagnosis not present

## 2021-04-14 DIAGNOSIS — N182 Chronic kidney disease, stage 2 (mild): Secondary | ICD-10-CM | POA: Diagnosis not present

## 2021-04-14 DIAGNOSIS — Y9373 Activity, racquet and hand sports: Secondary | ICD-10-CM | POA: Insufficient documentation

## 2021-04-14 DIAGNOSIS — Z7982 Long term (current) use of aspirin: Secondary | ICD-10-CM | POA: Insufficient documentation

## 2021-04-14 DIAGNOSIS — S0990XA Unspecified injury of head, initial encounter: Secondary | ICD-10-CM | POA: Diagnosis not present

## 2021-04-14 DIAGNOSIS — J45909 Unspecified asthma, uncomplicated: Secondary | ICD-10-CM | POA: Diagnosis not present

## 2021-04-14 DIAGNOSIS — M2578 Osteophyte, vertebrae: Secondary | ICD-10-CM | POA: Diagnosis not present

## 2021-04-14 DIAGNOSIS — I129 Hypertensive chronic kidney disease with stage 1 through stage 4 chronic kidney disease, or unspecified chronic kidney disease: Secondary | ICD-10-CM | POA: Diagnosis not present

## 2021-04-14 DIAGNOSIS — W228XXA Striking against or struck by other objects, initial encounter: Secondary | ICD-10-CM | POA: Diagnosis not present

## 2021-04-14 DIAGNOSIS — S060X0A Concussion without loss of consciousness, initial encounter: Secondary | ICD-10-CM | POA: Diagnosis not present

## 2021-04-14 DIAGNOSIS — S161XXA Strain of muscle, fascia and tendon at neck level, initial encounter: Secondary | ICD-10-CM | POA: Diagnosis not present

## 2021-04-14 DIAGNOSIS — J3489 Other specified disorders of nose and nasal sinuses: Secondary | ICD-10-CM | POA: Diagnosis not present

## 2021-04-14 NOTE — ED Provider Notes (Signed)
[facility] EMERGENCY DEPARTMENT Provider Note   CSN: [id number] Arrival date & time: 04/14/21  1504     History Chief Complaint  Patient presents with   Head Injury    Arthur Moore is a 70 y.o. male.  Patient is a 70 year old male with a history of hypertension, diastolic dysfunction, aortic insufficiency and prior cervical disc disease with laminectomy 8 years ago who is presenting today after head and neck injury.  Patient was playing pickle ball and he was running full force at a ball and ran into the wall headfirst and his neck snapped back.  He did not fall to the ground but reports he was very dazed and out of it right after it happened.  He currently is having a frontal headache but denies any visual changes.  He is also having pain in his mid neck but denies any numbness or tingling in his arms or legs.  He had no difficulty walking.  No changes in his hearing.  He does take an aspirin daily but no other anticoagulation.  The history is provided by the patient.  Head Injury Location:  Frontal Time since incident:  1 hour Mechanism of injury: direct blow   Pain details:    Quality:  Aching and pressure   Radiates to:  Face   Severity:  Moderate   Timing:  Constant   Progression:  Unchanged Chronicity:  New Relieved by:  None tried Worsened by:  Nothing Ineffective treatments:  None tried Associated symptoms: headache, nausea and neck pain   Associated symptoms: no blurred vision, no difficulty breathing, no double vision, no focal weakness, no hearing loss, no loss of consciousness and no vomiting   Associated symptoms comment:  Dazed     Past Medical History:  Diagnosis Date   Aortic insufficiency    Arthritis    Asthma    Cervical disc disease    Depression    Diastolic dysfunction 01/17/1940   Hyperlipidemia 08/24/2015   Hypertension    Lumbar disc disease    Psoriasis     Patient Active Problem List   Diagnosis Date Noted   Aneurysm of  ascending aorta 06/28/2020   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   CAD (coronary artery disease) 07/09/2018   CKD (chronic kidney disease), 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   Chest pain of uncertain etiology 74/11/1446   Chronic cough 06/21/2018   Thrombocytopenia ([place]) 12/01/2016   Transaminitis 18/56/3149   Diastolic dysfunction 70/26/3785   MDD (major depressive disorder), recurrent severe, without psychosis ([place]) 10/13/2015   Hyperlipidemia 08/24/2015   Arthritis    Depression    Hypertension    Aortic insufficiency    Psoriasis    Cervical disc disease    Lumbar disc disease    Asthma 01/06/2015   Psoriatic arthritis ([place]) 01/06/2015   Allergic rhinoconjunctivitis 01/06/2015   GERD (gastroesophageal reflux disease) 01/06/2015   Laryngopharyngeal reflux (LPR) 01/06/2015    Past Surgical History:  Procedure Laterality Date   CHOLECYSTECTOMY  2012   HERNIA REPAIR     LEFT HEART CATH AND CORONARY ANGIOGRAPHY N/A 07/08/2018   Procedure: LEFT HEART CATH AND CORONARY ANGIOGRAPHY;  Surgeon: Burnell Blanks, MD;  Location: [facility];  Service: Cardiovascular;  Laterality: N/A;   PAROTIDECTOMY Right 1992   PROSTATE SURGERY  2002, 2004   [place]     SINUS EXPLORATION  2013   SPINE SURGERY  2013   L C7-T1 laminectomy and discectomy   TONSILLECTOMY  Family History  Problem Relation Age of Onset   Colon cancer Mother    Lung cancer Father    Cancer Other    Bipolar disorder Brother    Allergic rhinitis Neg Hx    Angioedema Neg Hx    Asthma Neg Hx    Eczema Neg Hx     Social History   Tobacco Use   Smoking status: Never   Smokeless tobacco: Never  Vaping Use   Vaping Use: Never used  Substance Use Topics   Alcohol use: Not Currently   Drug use: No    Home Medications Prior to Admission medications   Medication Sig Start Date End Date Taking? Authorizing Provider  albuterol (VENTOLIN HFA) 108 (90 Base) MCG/ACT inhaler  INHALE 2 PUFFS INTO THE LUNGS EVERY 4 (FOUR) HOURS AS NEEDED FOR WHEEZING OR SHORTNESS OF BREATH. 07/03/20   Kennith Gain, MD  aspirin EC 81 MG tablet Take 81 mg by mouth daily.    [provider]  budesonide (RHINOCORT AQUA) 32 MCG/ACT nasal spray Use two sprays in each nostril once daily 02/21/20   Kozlow, Donnamarie Poag, MD  budesonide-formoterol (SYMBICORT) 160-4.5 MCG/ACT inhaler Inhale 2 puffs into the lungs 2 (two) times daily.    [provider]  busPIRone (BUSPAR) 10 MG tablet TAKE ONE TABLET BY MOUTH THREE TIMES A DAY 04/04/21   Mozingo, Berdie Ogren, NP  Cetirizine HCl (ZYRTEC ALLERGY PO) Take 1 tablet by mouth daily.    [provider]  Cholecalciferol (VITAMIN D3) 5000 units TABS Take 5,000 Units by mouth daily.     [provider]  Crisaborole (EUCRISA) 2 % OINT Apply 1 application topically 2 (two) times daily as needed. 02/12/21   Kozlow, Donnamarie Poag, MD  Crisaborole 2 % OINT Apply 1 application topically 2 (two) times daily. 02/14/21   Kozlow, Donnamarie Poag, MD  lamoTRIgine (LAMICTAL) 200 MG tablet Take 1 tablet (200 mg total) by mouth 2 (two) times daily. 12/04/20   Mozingo, Berdie Ogren, NP  Magnesium Oxide (MAG-OXIDE) 200 MG TABS Take 2.5 tablets (500 mg total) by mouth daily. Patient taking differently: Take 2 tablets by mouth daily. 07/26/18   Lendon Colonel, NP  metoprolol tartrate (LOPRESSOR) 25 MG tablet TAKE 1/2 TABLET BY MOUTH TWICE A DAY 01/31/21   Skeet Latch, MD  Misc Natural Products (OSTEO BI-FLEX ADV JOINT SHIELD PO) Take 1 tablet by mouth daily.    [provider]  Multiple Vitamin (MULTIVITAMIN) capsule Take 1 capsule by mouth daily.    [provider]  nitroGLYCERIN (NITROSTAT) 0.4 MG SL tablet Place 1 tablet (0.4 mg total) under the tongue every 5 (five) minutes as needed for chest pain. 05/13/19 02/05/20  Deberah Pelton, NP  pantoprazole (PROTONIX) 40 MG tablet Take 1 tablet (40 mg total) by mouth daily.  02/12/21   Kozlow, Donnamarie Poag, MD  Probiotic Product (PROBIOTIC DAILY PO) Take 1 tablet by mouth daily.    [provider]  vitamin C (ASCORBIC ACID) 250 MG tablet Take 250 mg by mouth daily.    [provider]  zinc gluconate 50 MG tablet Take 1 tablet (50 mg total) by mouth daily. 07/26/18   Lendon Colonel, NP    Allergies    Atorvastatin and Zolpidem  Review of Systems   Review of Systems  HENT:  Negative for hearing loss.   Eyes:  Negative for blurred vision and double vision.  Gastrointestinal:  Positive for nausea. Negative for vomiting.  Musculoskeletal:  Positive for neck pain.  Neurological:  Positive for headaches. Negative for focal weakness and loss of consciousness.  All other systems reviewed and are negative.  Physical Exam Updated Vital Signs BP 125/82 (BP Location: Right Arm)    Pulse 71    Temp 98.3 F (36.8 C) (Oral)    Resp 16    SpO2 94%   Physical Exam Vitals and nursing note reviewed.  Constitutional:      General: He is not in acute distress.    Appearance: He is well-developed.  HENT:     Head: Normocephalic.     Comments: Ecchymosis and minimal hematoma present over the forehead    Right Ear: Tympanic membrane normal.     Left Ear: Tympanic membrane normal.     Nose: Nose normal.     Mouth/Throat:     Mouth: Mucous membranes are moist.  Eyes:     Extraocular Movements: Extraocular movements intact.     Conjunctiva/sclera: Conjunctivae normal.     Pupils: Pupils are equal, round, and reactive to light.  Neck:     Comments: Patient in a hard collar.  Tenderness from C5-C7 in the midline. Cardiovascular:     Rate and Rhythm: Normal rate and regular rhythm.     Heart sounds: No murmur heard. Pulmonary:     Effort: Pulmonary effort is normal. No respiratory distress.     Breath sounds: Normal breath sounds. No wheezing or rales.  Abdominal:     General: There is no distension.     Palpations: Abdomen is soft.     Tenderness:  There is no abdominal tenderness. There is no guarding or rebound.  Musculoskeletal:        General: Normal range of motion.     Cervical back: Tenderness present.  Skin:    General: Skin is warm and dry.     Capillary Refill: Capillary refill takes less than 2 seconds.     Findings: No erythema or rash.  Neurological:     Mental Status: He is alert and oriented to person, place, and time. Mental status is at baseline.     Cranial Nerves: No cranial nerve deficit.     Sensory: No sensory deficit.     Motor: No weakness.     Gait: Gait normal.  Psychiatric:        Mood and Affect: Mood normal.        Behavior: Behavior normal.    ED Results / Procedures / Treatments   Labs (all labs ordered are listed, but only abnormal results are displayed) Labs Reviewed - No data to display  EKG None  Radiology CT Head Wo Contrast  Result Date: 04/14/2021 CLINICAL DATA:  Head trauma, moderate-severe EXAM: CT HEAD WITHOUT CONTRAST TECHNIQUE: Contiguous axial images were obtained from the base of the skull through the vertex without intravenous contrast. COMPARISON:  Head CT 12/07/2020. FINDINGS: Brain: No evidence of acute intracranial hemorrhage or extra-axial collection.No evidence of mass lesion/concern mass effect.The ventricles are normal in size.Scattered subcortical and periventricular white matter hypodensities, nonspecific but likely sequela of chronic small vessel ischemic disease. Vascular: No hyperdense vessel or unexpected calcification. Skull: Normal. Negative for fracture or focal lesion. Sinuses/Orbits: Mild paranasal sinus mucosal thickening. Other: None. IMPRESSION: No acute intracranial abnormality. Sequela of chronic small vessel ischemic disease. Electronically Signed   By: Maurine Simmering M.D.   On: 04/14/2021 16:24   CT Cervical Spine Wo Contrast  Result Date: 04/14/2021 CLINICAL DATA:  Neck trauma (Age >=  65y) EXAM: CT CERVICAL SPINE WITHOUT CONTRAST TECHNIQUE: Multidetector  CT imaging of the cervical spine was performed without intravenous contrast. Multiplanar CT image reconstructions were also generated. COMPARISON:  Cervical spine CT 02/24/2020 FINDINGS: Alignment: Normal Skull base and vertebrae: No acute cervical spine fracture. Prior left-sided laminectomies at C7-T1. Soft tissues and spinal canal: No prevertebral fluid or swelling. No visible canal hematoma. Disc levels: Multilevel degenerative disc disease with posterior disc osteophyte complexes at C3-C4, C5-C6, and C6-C7 resulting in mild-to-moderate bilateral neural foraminal narrowing at these levels. Mild bilateral facet arthropathy multiple levels. 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 Upper chest: Mostly excluded from the field of view but unremarkable. Other: Subcentimeter hypodense thyroid nodules which do not require follow-up. IMPRESSION: No acute cervical spine fracture. Prior left-sided laminectomies at C6 and C7 Multilevel degenerative disc disease with mild to moderate bilateral neural foraminal narrowing at C3-C4, C5-C6, and C6-C7. 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 Electronically Signed   By: Maurine Simmering M.D.   On: 04/14/2021 16:31   CT THORACIC SPINE WO CONTRAST  Result Date: 04/14/2021 CLINICAL DATA:  Pain, trauma EXAM: CT THORACIC SPINE WITHOUT CONTRAST TECHNIQUE: Multidetector CT images of the thoracic were obtained using the standard protocol without intravenous contrast. COMPARISON:  Chest CT 04/11/2020. FINDINGS: Alignment: Normal. Vertebrae: No acute fracture or focal pathologic process. Paraspinal and other soft tissues: Negative. Disc levels: No visible impingement. Mild multilevel degenerative changes. Unchanged tiny disc osteophyte complex at T2-T3. Unchanged Intradiscal calcification at T9-T10. IMPRESSION: No acute thoracic spine fracture. Electronically Signed   By: Maurine Simmering M.D.   On: 04/14/2021 16:20     Procedures Procedures   Medications Ordered in ED Medications - No data to display  ED Course  I have reviewed the triage vital signs and the nursing notes.  Pertinent labs & imaging results that were available during my care of the patient were reviewed by me and considered in my medical decision making (see chart for details).    MDM Rules/Calculators/A&P                         Patient is a 70 year old male presenting today after a head and neck injury when he ran into a wall.  He did not have loss of consciousness but did feel dazed.  Neurovascularly intact at this time.  He is complaining of a headache but denies any visual changes.  He does not take anticoagulation.  Concern for possible cervical spine injury versus intracranial injury versus concussion.  Vital signs are within normal limits.  Exam is reassuring.  Imaging is pending.  5:46 PM Imaging neg for acute process.  C-spine cleared.  Pt given concussion precautions.  MDM   Amount and/or Complexity of Data Reviewed Tests in the radiology section of CPT: ordered and reviewed Independent visualization of images, tracings, or specimens: yes        Final Clinical Impression(s) / ED Diagnoses Final diagnoses:  Concussion without loss of consciousness, initial encounter  Cervical strain, acute, initial encounter    Rx / DC Orders ED Discharge Orders     None        Blanchie Dessert, MD 04/14/21 1746

## 2021-04-14 NOTE — ED Notes (Signed)
ED Provider at bedside. 

## 2021-04-14 NOTE — ED Triage Notes (Signed)
Pt reports playing pickleball today and went full force into a brick wall when going for a shot. States his neck "snapped back". Witnesses states he was "out of it" for a while. Pt drove self to ED and walked to registration. C-collar applied in triage. C/o dizziness

## 2021-04-14 NOTE — ED Triage Notes (Signed)
Pt reports hitting anterior head on brick wall while playing pickle ball. C/o headache, neck pain, dizziness, mild nausea. Denies vision changes, numbness/tingling in extremities, blood thinners. C-collar in place.

## 2021-04-14 NOTE — Discharge Instructions (Signed)
Use voltaren gel, tylenol and heating pad for pain and sore muscles.  If you start have excessive vomiting, severe headache that is uncontrolled, difficulty walking or change in your vision you should return for repeat head scan

## 2021-04-15 DIAGNOSIS — K219 Gastro-esophageal reflux disease without esophagitis: Secondary | ICD-10-CM | POA: Diagnosis not present

## 2021-04-15 DIAGNOSIS — L4050 Arthropathic psoriasis, unspecified: Secondary | ICD-10-CM | POA: Diagnosis not present

## 2021-04-15 DIAGNOSIS — E7800 Pure hypercholesterolemia, unspecified: Secondary | ICD-10-CM | POA: Diagnosis not present

## 2021-04-15 DIAGNOSIS — J4540 Moderate persistent asthma, uncomplicated: Secondary | ICD-10-CM | POA: Diagnosis not present

## 2021-04-15 DIAGNOSIS — G4700 Insomnia, unspecified: Secondary | ICD-10-CM | POA: Diagnosis not present

## 2021-04-15 DIAGNOSIS — I10 Essential (primary) hypertension: Secondary | ICD-10-CM | POA: Diagnosis not present

## 2021-04-15 DIAGNOSIS — N400 Enlarged prostate without lower urinary tract symptoms: Secondary | ICD-10-CM | POA: Diagnosis not present

## 2021-04-22 ENCOUNTER — Other Ambulatory Visit: Payer: Self-pay | Admitting: Adult Health

## 2021-04-22 DIAGNOSIS — F411 Generalized anxiety disorder: Secondary | ICD-10-CM

## 2021-04-24 NOTE — Telephone Encounter (Signed)
Patient lvm for refill on Buspirone 10mg . He would also like to know if its possible to switch to a different anti anxiety medication and up the dose for Latuda. LVM to call back with more information. Ph: [phone]. Appt 1/9. Pharmacy [facility] at [place], [place]

## 2021-04-24 NOTE — Telephone Encounter (Signed)
Patient rtc stating that he is not doing well right now and would like to be prescribed a different high dosage anti anxiety medication such as Paxil or Xanax. He would like prescription today as he is out of Buspirone. He would like a rtc to discuss. Ph: [phone] Appt 12/29.

## 2021-04-25 ENCOUNTER — Ambulatory Visit (INDEPENDENT_AMBULATORY_CARE_PROVIDER_SITE_OTHER): Payer: Medicare Other | Admitting: Adult Health

## 2021-04-25 ENCOUNTER — Other Ambulatory Visit: Payer: Self-pay

## 2021-04-25 ENCOUNTER — Telehealth: Payer: Self-pay | Admitting: Adult Health

## 2021-04-25 ENCOUNTER — Encounter: Payer: Self-pay | Admitting: Adult Health

## 2021-04-25 DIAGNOSIS — F4310 Post-traumatic stress disorder, unspecified: Secondary | ICD-10-CM

## 2021-04-25 DIAGNOSIS — F418 Other specified anxiety disorders: Secondary | ICD-10-CM | POA: Diagnosis not present

## 2021-04-25 DIAGNOSIS — F411 Generalized anxiety disorder: Secondary | ICD-10-CM | POA: Diagnosis not present

## 2021-04-25 DIAGNOSIS — F39 Unspecified mood [affective] disorder: Secondary | ICD-10-CM

## 2021-04-25 MED ORDER — BUSPIRONE HCL 15 MG PO TABS: 15.0000 mg | ORAL_TABLET | Freq: Three times a day (TID) | ORAL | 5 refills | Status: DC

## 2021-04-25 MED ORDER — DESVENLAFAXINE SUCCINATE ER 50 MG PO TB24: 50.0000 mg | ORAL_TABLET | Freq: Every day | ORAL | 2 refills | Status: DC

## 2021-04-25 NOTE — Telephone Encounter (Signed)
Script sent  

## 2021-04-25 NOTE — Addendum Note (Signed)
Addended by: Aloha Gell on: 04/25/2021 03:55 PM   Modules accepted: Orders

## 2021-04-25 NOTE — Telephone Encounter (Signed)
Please review

## 2021-04-25 NOTE — Progress Notes (Signed)
Arthur Moore [id number] April 22, 1951 70 y.o.  Subjective:   Patient ID:  Arthur Moore is a 70 y.o. (DOB Jul 02, 1950) male.  Chief Complaint: No chief complaint on file.   HPI Arthur Moore presents to the office today for follow-up of Episodic Mood Disorder, PTSD, depression and situational anxiety.  Accompanied by wife.  Describes mood today as "not so good". Pleasant. Flat. Increased tearfulness. Reports depression, anxiety - "nervousness in stomach", and irritability. Reports worry and intrusive thoughts. Wife notes he is "fretful". Denies recent panic attacks. Reports some symptom improvement initially with addition of Latuda and increase in Buspar. Stating "I'm not doing so well now". Discussed SAD as a possible contributor to declining mood. Varying interest and motivation. Taking medications as prescribed.  Energy levels stable. Active, has a regular exercise routine. Playing pickle ball twice a week. Enjoys some usual interests and activities. Married. Lives with wife - 4 children between them. Spending time with family. Appetite stable. Weight stable - 175 to 180 pounds. Sleeps better some nights than others. Waking up during the night. Averages 8 hours. Focus and concentration difficulties - starting and stopping tasks.  Completing tasks. Managing aspects of household. Retired - [employer] veteran. Denies SI or HI.  Denies AH or VH.  Previous medication trials: Lexapro, Cymbalta, Xanax, Lamictal, Wellbutrin, Effexor, Paxil, Abilify  OTC - Melatonin   PHQ2-9    Flowsheet Row Nutrition from 06/11/2017 in [facility]  PHQ-2 Total Score 0      Flowsheet Row ED from 04/14/2021 in [facility] from 12/07/2020 in [facility] from 11/08/2020 in [facility] No Risk No Risk Error: Question 6 not populated        Review of  Systems:  Review of Systems  Musculoskeletal:  Negative for gait problem.  Neurological:  Negative for tremors.  Psychiatric/Behavioral:         Please refer to HPI   Medications: I have reviewed the patient's current medications.  Current Outpatient Medications  Medication Sig Dispense Refill   desvenlafaxine (PRISTIQ) 50 MG 24 hr tablet Take 1 tablet (50 mg total) by mouth daily. 30 tablet 2   albuterol (VENTOLIN HFA) 108 (90 Base) MCG/ACT inhaler INHALE 2 PUFFS INTO THE LUNGS EVERY 4 (FOUR) HOURS AS NEEDED FOR WHEEZING OR SHORTNESS OF BREATH. 6.7 each 1   aspirin EC 81 MG tablet Take 81 mg by mouth daily.     budesonide (RHINOCORT AQUA) 32 MCG/ACT nasal spray Use two sprays in each nostril once daily 8.43 mL 5   budesonide-formoterol (SYMBICORT) 160-4.5 MCG/ACT inhaler Inhale 2 puffs into the lungs 2 (two) times daily.     busPIRone (BUSPAR) 10 MG tablet TAKE ONE TABLET BY MOUTH THREE TIMES A DAY 90 tablet 0   Cetirizine HCl (ZYRTEC ALLERGY PO) Take 1 tablet by mouth daily.     Cholecalciferol (VITAMIN D3) 5000 units TABS Take 5,000 Units by mouth daily.      Crisaborole (EUCRISA) 2 % OINT Apply 1 application topically 2 (two) times daily as needed. 60 g 5   Crisaborole 2 % OINT Apply 1 application topically 2 (two) times daily. 100 g 5   lamoTRIgine (LAMICTAL) 200 MG tablet Take 1 tablet (200 mg total) by mouth 2 (two) times daily. 180 tablet 3   Magnesium Oxide (MAG-OXIDE) 200 MG TABS Take 2.5 tablets (500 mg total) by mouth daily. (Patient taking differently: Take 2 tablets by  mouth daily.)  0   metoprolol tartrate (LOPRESSOR) 25 MG tablet TAKE 1/2 TABLET BY MOUTH TWICE A DAY 90 tablet 3   Misc Natural Products (OSTEO BI-FLEX ADV JOINT SHIELD PO) Take 1 tablet by mouth daily.     Multiple Vitamin (MULTIVITAMIN) capsule Take 1 capsule by mouth daily.     nitroGLYCERIN (NITROSTAT) 0.4 MG SL tablet Place 1 tablet (0.4 mg total) under the tongue every 5 (five) minutes as needed for chest  pain. 25 tablet 3   pantoprazole (PROTONIX) 40 MG tablet Take 1 tablet (40 mg total) by mouth daily. 90 tablet 1   Probiotic Product (PROBIOTIC DAILY PO) Take 1 tablet by mouth daily.     vitamin C (ASCORBIC ACID) 250 MG tablet Take 250 mg by mouth daily.     zinc gluconate 50 MG tablet Take 1 tablet (50 mg total) by mouth daily.     No current facility-administered medications for this visit.    Medication Side Effects: None  Allergies:  Allergies  Allergen Reactions   Atorvastatin Other (See Comments)    Calf pain.   Zolpidem Other (See Comments)    Other reaction(s): Drowsy    Past Medical History:  Diagnosis Date   Aortic insufficiency    Arthritis    Asthma    Cervical disc disease    Depression    Diastolic dysfunction 5/46/5681   Hyperlipidemia 08/24/2015   Hypertension    Lumbar disc disease    Psoriasis     Past Medical History, Surgical history, Social history, and Family history were reviewed and updated as appropriate.   Please see review of systems for further details on the patient's review from today.   Objective:   Physical Exam:  There were no vitals taken for this visit.  Physical Exam Constitutional:      General: He is not in acute distress. Musculoskeletal:        General: No deformity.  Neurological:     Mental Status: He is alert and oriented to person, place, and time.     Coordination: Coordination normal.  Psychiatric:        Attention and Perception: Attention and perception normal. He does not perceive auditory or visual hallucinations.        Mood and Affect: Mood normal. Mood is not anxious or depressed. Affect is not labile, blunt, angry or inappropriate.        Speech: Speech normal.        Behavior: Behavior normal.        Thought Content: Thought content normal. Thought content is not paranoid or delusional. Thought content does not include homicidal or suicidal ideation. Thought content does not include homicidal or suicidal  plan.        Cognition and Memory: Cognition and memory normal.        Judgment: Judgment normal.     Comments: Insight intact    Lab Review:     Component Value Date/Time   NA 138 11/08/2020 1350   NA 144 10/18/2020 1130   K 3.9 11/08/2020 1350   CL 106 11/08/2020 1350   CO2 24 11/08/2020 1350   GLUCOSE 87 11/08/2020 1350   BUN 24 (H) 11/08/2020 1350   BUN 20 10/18/2020 1130   CREATININE 1.39 (H) 11/08/2020 1350   CALCIUM 9.6 11/08/2020 1350   PROT 6.6 10/18/2020 1130   ALBUMIN 4.7 10/18/2020 1130   AST 20 10/18/2020 1130   ALT 21 10/18/2020 1130   ALKPHOS 89  10/18/2020 1130   BILITOT 0.7 10/18/2020 1130   GFRNONAA 55 (L) 11/08/2020 1350   GFRAA >60 01/01/2020 0324       Component Value Date/Time   WBC 6.9 11/08/2020 1350   RBC 5.19 11/08/2020 1350   HGB 15.0 11/08/2020 1350   HGB 16.7 12/09/2016 1537   HCT 43.5 11/08/2020 1350   HCT 46.9 12/09/2016 1537   PLT 145 (L) 11/08/2020 1350   PLT 141 12/09/2016 1537   MCV 83.8 11/08/2020 1350   MCV 87.3 12/09/2016 1537   MCH 28.9 11/08/2020 1350   MCHC 34.5 11/08/2020 1350   RDW 12.9 11/08/2020 1350   RDW 13.6 12/09/2016 1537   LYMPHSABS 2.3 11/08/2020 1350   LYMPHSABS 1.5 12/09/2016 1537   MONOABS 0.6 11/08/2020 1350   MONOABS 0.4 12/09/2016 1537   EOSABS 0.4 11/08/2020 1350   EOSABS 0.3 12/09/2016 1537   BASOSABS 0.0 11/08/2020 1350   BASOSABS 0.0 12/09/2016 1537    No results found for: POCLITH, LITHIUM   No results found for: PHENYTOIN, PHENOBARB, VALPROATE, CBMZ   .res Assessment: Plan:     Plan:  PDMP reviewed  1. Lamictal 200mg  twice daily. 2. Decrease Buspar 20mg  to 15mg  TID   3. Decrease Latuda 40mg  to 20mg  daily - samples given 4. Add Pristiq 50mg  every morning  RTC 4 weeks  Patient advised to contact office with any questions, adverse effects, or acute worsening in signs and symptoms. Counseled patient regarding potential benefits, risks, and side effects of Lamictal to include potential  risk of Stevens-Johnson syndrome. Advised patient to stop taking Lamictal and contact office immediately if rash develops and to seek urgent medical attention if rash is severe and/or spreading quickly.   Diagnoses and all orders for this visit:  Situational anxiety -     desvenlafaxine (PRISTIQ) 50 MG 24 hr tablet; Take 1 tablet (50 mg total) by mouth daily.  Episodic mood disorder (HCC) -     desvenlafaxine (PRISTIQ) 50 MG 24 hr tablet; Take 1 tablet (50 mg total) by mouth daily.  GAD (generalized anxiety disorder) -     desvenlafaxine (PRISTIQ) 50 MG 24 hr tablet; Take 1 tablet (50 mg total) by mouth daily.  Posttraumatic stress disorder with delayed expression -     desvenlafaxine (PRISTIQ) 50 MG 24 hr tablet; Take 1 tablet (50 mg total) by mouth daily.     Please see After Visit Summary for patient specific instructions.  Future Appointments  Date Time Provider [place]  05/06/2021  3:20 PM Meilani Edmundson, Berdie Ogren, NP [facility] None  06/06/2021  9:40 AM Dhruvi Crenshaw, Berdie Ogren, NP [facility] None  07/08/2021  4:20 PM Skeet Latch, MD [facility]    No orders of the defined types were placed in this encounter.   -------------------------------

## 2021-04-25 NOTE — Telephone Encounter (Signed)
Pt was seen today and said that gina was suppose to send in a new script of the [facility]par to [facility] [address]. He was told to take 6 pills a day of this med. Please send ASAP as pt is waiting at the pharmacy

## 2021-04-30 ENCOUNTER — Ambulatory Visit (INDEPENDENT_AMBULATORY_CARE_PROVIDER_SITE_OTHER): Payer: Medicare Other | Admitting: *Deleted

## 2021-04-30 DIAGNOSIS — J309 Allergic rhinitis, unspecified: Secondary | ICD-10-CM

## 2021-05-06 ENCOUNTER — Ambulatory Visit: Payer: Medicare Other | Admitting: Adult Health

## 2021-05-07 ENCOUNTER — Ambulatory Visit (INDEPENDENT_AMBULATORY_CARE_PROVIDER_SITE_OTHER): Payer: Medicare Other | Admitting: *Deleted

## 2021-05-07 DIAGNOSIS — C44519 Basal cell carcinoma of skin of other part of trunk: Secondary | ICD-10-CM | POA: Diagnosis not present

## 2021-05-07 DIAGNOSIS — J309 Allergic rhinitis, unspecified: Secondary | ICD-10-CM | POA: Diagnosis not present

## 2021-05-07 DIAGNOSIS — L821 Other seborrheic keratosis: Secondary | ICD-10-CM | POA: Diagnosis not present

## 2021-05-07 DIAGNOSIS — L578 Other skin changes due to chronic exposure to nonionizing radiation: Secondary | ICD-10-CM | POA: Diagnosis not present

## 2021-05-07 DIAGNOSIS — L905 Scar conditions and fibrosis of skin: Secondary | ICD-10-CM | POA: Diagnosis not present

## 2021-05-07 DIAGNOSIS — B078 Other viral warts: Secondary | ICD-10-CM | POA: Diagnosis not present

## 2021-05-07 DIAGNOSIS — D485 Neoplasm of uncertain behavior of skin: Secondary | ICD-10-CM | POA: Diagnosis not present

## 2021-05-07 DIAGNOSIS — L218 Other seborrheic dermatitis: Secondary | ICD-10-CM | POA: Diagnosis not present

## 2021-05-10 ENCOUNTER — Other Ambulatory Visit: Payer: Self-pay

## 2021-05-10 ENCOUNTER — Other Ambulatory Visit: Payer: Self-pay | Admitting: Adult Health

## 2021-05-10 ENCOUNTER — Telehealth: Payer: Self-pay | Admitting: Adult Health

## 2021-05-10 DIAGNOSIS — F4310 Post-traumatic stress disorder, unspecified: Secondary | ICD-10-CM

## 2021-05-10 DIAGNOSIS — F418 Other specified anxiety disorders: Secondary | ICD-10-CM

## 2021-05-10 DIAGNOSIS — F411 Generalized anxiety disorder: Secondary | ICD-10-CM

## 2021-05-10 DIAGNOSIS — F39 Unspecified mood [affective] disorder: Secondary | ICD-10-CM

## 2021-05-10 MED ORDER — DESVENLAFAXINE SUCCINATE ER 50 MG PO TB24: 50.0000 mg | ORAL_TABLET | Freq: Every day | ORAL | 1 refills | Status: DC

## 2021-05-10 MED ORDER — LURASIDONE HCL 40 MG PO TABS: 40.0000 mg | ORAL_TABLET | Freq: Every day | ORAL | 0 refills | Status: DC

## 2021-05-10 NOTE — Telephone Encounter (Signed)
Patient called in stating that the prescription sent in today from [facility] cannot be filled with discount card due to him not having commercial insurance. He has been receiving samples lately. He is unsure of what to do. Please rtc [phone]

## 2021-05-10 NOTE — Telephone Encounter (Signed)
Pt called reporting the Latuda 40 mg and Desvenlafaxine 50 mg finally kicked in and is working well. Requesting Rx for Latuda 40 mg for #90 @ [facility]  [facility] . He has discount card already. Apt 2/8

## 2021-05-10 NOTE — Telephone Encounter (Signed)
Called and spoke w patient.

## 2021-05-10 NOTE — Telephone Encounter (Signed)
Rx sent 

## 2021-05-10 NOTE — Telephone Encounter (Signed)
Please review

## 2021-05-15 ENCOUNTER — Other Ambulatory Visit: Payer: Self-pay | Admitting: Adult Health

## 2021-05-15 ENCOUNTER — Ambulatory Visit (INDEPENDENT_AMBULATORY_CARE_PROVIDER_SITE_OTHER): Payer: Medicare Other

## 2021-05-15 DIAGNOSIS — F411 Generalized anxiety disorder: Secondary | ICD-10-CM

## 2021-05-15 DIAGNOSIS — F4310 Post-traumatic stress disorder, unspecified: Secondary | ICD-10-CM

## 2021-05-15 DIAGNOSIS — F39 Unspecified mood [affective] disorder: Secondary | ICD-10-CM

## 2021-05-15 DIAGNOSIS — F418 Other specified anxiety disorders: Secondary | ICD-10-CM

## 2021-05-15 DIAGNOSIS — J309 Allergic rhinitis, unspecified: Secondary | ICD-10-CM | POA: Diagnosis not present

## 2021-05-15 MED ORDER — LURASIDONE HCL 40 MG PO TABS: 40.0000 mg | ORAL_TABLET | Freq: Every day | ORAL | 1 refills | Status: DC

## 2021-05-15 MED ORDER — BUSPIRONE HCL 15 MG PO TABS: 15.0000 mg | ORAL_TABLET | Freq: Three times a day (TID) | ORAL | 1 refills | Status: DC

## 2021-05-15 MED ORDER — DESVENLAFAXINE SUCCINATE ER 50 MG PO TB24: 50.0000 mg | ORAL_TABLET | Freq: Every day | ORAL | 1 refills | Status: DC

## 2021-05-15 MED ORDER — LAMOTRIGINE 200 MG PO TABS: 200.0000 mg | ORAL_TABLET | Freq: Two times a day (BID) | ORAL | 1 refills | Status: DC

## 2021-05-29 ENCOUNTER — Ambulatory Visit (INDEPENDENT_AMBULATORY_CARE_PROVIDER_SITE_OTHER): Payer: Medicare Other | Admitting: *Deleted

## 2021-05-29 DIAGNOSIS — J309 Allergic rhinitis, unspecified: Secondary | ICD-10-CM | POA: Diagnosis not present

## 2021-05-30 DIAGNOSIS — C44519 Basal cell carcinoma of skin of other part of trunk: Secondary | ICD-10-CM | POA: Diagnosis not present

## 2021-06-04 ENCOUNTER — Ambulatory Visit (INDEPENDENT_AMBULATORY_CARE_PROVIDER_SITE_OTHER): Payer: Medicare Other | Admitting: *Deleted

## 2021-06-04 DIAGNOSIS — J309 Allergic rhinitis, unspecified: Secondary | ICD-10-CM

## 2021-06-05 ENCOUNTER — Ambulatory Visit: Payer: Medicare Other | Admitting: Adult Health

## 2021-06-06 ENCOUNTER — Other Ambulatory Visit: Payer: Self-pay

## 2021-06-06 ENCOUNTER — Ambulatory Visit: Payer: Medicare Other | Admitting: Adult Health

## 2021-06-06 ENCOUNTER — Encounter: Payer: Self-pay | Admitting: Adult Health

## 2021-06-06 ENCOUNTER — Ambulatory Visit (INDEPENDENT_AMBULATORY_CARE_PROVIDER_SITE_OTHER): Payer: Medicare Other | Admitting: Adult Health

## 2021-06-06 DIAGNOSIS — F411 Generalized anxiety disorder: Secondary | ICD-10-CM

## 2021-06-06 DIAGNOSIS — F39 Unspecified mood [affective] disorder: Secondary | ICD-10-CM | POA: Diagnosis not present

## 2021-06-06 DIAGNOSIS — F4310 Post-traumatic stress disorder, unspecified: Secondary | ICD-10-CM

## 2021-06-06 DIAGNOSIS — F418 Other specified anxiety disorders: Secondary | ICD-10-CM | POA: Diagnosis not present

## 2021-06-06 MED ORDER — LURASIDONE HCL 40 MG PO TABS: 40.0000 mg | ORAL_TABLET | Freq: Every day | ORAL | 3 refills | Status: DC

## 2021-06-06 MED ORDER — LAMOTRIGINE 200 MG PO TABS: 200.0000 mg | ORAL_TABLET | Freq: Two times a day (BID) | ORAL | 3 refills | Status: DC

## 2021-06-06 MED ORDER — BUSPIRONE HCL 15 MG PO TABS: 15.0000 mg | ORAL_TABLET | Freq: Three times a day (TID) | ORAL | 3 refills | Status: DC

## 2021-06-06 MED ORDER — DESVENLAFAXINE SUCCINATE ER 100 MG PO TB24: 100.0000 mg | ORAL_TABLET | Freq: Every day | ORAL | 1 refills | Status: DC

## 2021-06-06 NOTE — Progress Notes (Signed)
Pt called reporting the Latuda 40 mg and Desvenlafaxine 50 mg finally kicked in and is working well. Requesting Rx for Latuda 40 mg for #90 @ [facility] . He has discount card already. Cuong Moorman [phone] 1950/07/17 71 y.o.  Subjective:   Patient ID:  Arthur Moore is a 71 y.o. (DOB 01-31-1951) male.  Chief Complaint: No chief complaint on file.   HPI Rahn Lacuesta presents to the office today for follow-up of Episodic Mood Disorder, PTSD, depression and situational anxiety.  Describes mood today as "better". Pleasant. Decreased tearfulness. Reports decreased depression and irritability. Feels anxious at times. Denies recent panic attacks. Reports decreased worry and intrusive thoughts. Feels like addition of Pristiq has been helpful. Noting some soncerns about wife's health. Improved interest and motivation. Taking medications as prescribed.  Energy levels stable. Active, has a regular exercise routine. Playing pickle ball twice a week. Enjoys some usual interests and activities. Married. Lives with wife - 4 children between them. Spending time with family. Appetite stable. Weight stable - 180 pounds. Sleeping better at nights. Averages 9 to 10 hours. Focus and concentration difficulties - starting and stopping tasks. Completing tasks. Managing aspects of household. Retired - [employer] veteran. Denies SI or HI.  Denies AH or VH.  Previous medication trials: Lexapro, Cymbalta, Xanax, Lamictal, Wellbutrin, Effexor, Paxil, Abilify  OTC - Melatonin   PHQ2-9    Flowsheet Row [facility]  PHQ-2 Total Score 0      Flowsheet Row ED from 04/14/2021 in [facility] from 12/07/2020 in [facility] from 11/08/2020 in [facility] No Risk No Risk Error: Question 6 not populated        Review of  Systems:  Review of Systems  Musculoskeletal:  Negative for gait problem.  Neurological:  Negative for tremors.  Psychiatric/Behavioral:         Please refer to HPI   Medications: I have reviewed the patient's current medications.  Current Outpatient Medications  Medication Sig Dispense Refill   albuterol (VENTOLIN HFA) 108 (90 Base) MCG/ACT inhaler INHALE 2 PUFFS INTO THE LUNGS EVERY 4 (FOUR) HOURS AS NEEDED FOR WHEEZING OR SHORTNESS OF BREATH. 6.7 each 1   aspirin EC 81 MG tablet Take 81 mg by mouth daily.     budesonide (RHINOCORT AQUA) 32 MCG/ACT nasal spray Use two sprays in each nostril once daily 8.43 mL 5   budesonide-formoterol (SYMBICORT) 160-4.5 MCG/ACT inhaler Inhale 2 puffs into the lungs 2 (two) times daily.     busPIRone (BUSPAR) 10 MG tablet TAKE ONE TABLET BY MOUTH THREE TIMES A DAY 90 tablet 0   busPIRone (BUSPAR) 15 MG tablet Take 1 tablet (15 mg total) by mouth 3 (three) times daily. 270 tablet 3   Cetirizine HCl (ZYRTEC ALLERGY PO) Take 1 tablet by mouth daily.     Cholecalciferol (VITAMIN D3) 5000 units TABS Take 5,000 Units by mouth daily.      Crisaborole (EUCRISA) 2 % OINT Apply 1 application topically 2 (two) times daily as needed. 60 g 5   Crisaborole 2 % OINT Apply 1 application topically 2 (two) times daily. 100 g 5   desvenlafaxine (PRISTIQ) 100 MG 24 hr tablet Take 1 tablet (100 mg total) by mouth daily. 90 tablet 1   lamoTRIgine (LAMICTAL) 200 MG tablet Take 1 tablet (200 mg total) by mouth 2 (two) times daily. [place]  tablet 3   lurasidone (LATUDA) 40 MG TABS tablet Take 1 tablet (40 mg total) by mouth daily with breakfast. 90 tablet 3   Magnesium Oxide (MAG-OXIDE) 200 MG TABS Take 2.5 tablets (500 mg total) by mouth daily. (Patient taking differently: Take 2 tablets by mouth daily.)  0   metoprolol tartrate (LOPRESSOR) 25 MG tablet TAKE 1/2 TABLET BY MOUTH TWICE A DAY 90 tablet 3   Misc Natural Products (OSTEO BI-FLEX ADV JOINT SHIELD PO) Take 1 tablet by mouth  daily.     Multiple Vitamin (MULTIVITAMIN) capsule Take 1 capsule by mouth daily.     nitroGLYCERIN (NITROSTAT) 0.4 MG SL tablet Place 1 tablet (0.4 mg total) under the tongue every 5 (five) minutes as needed for chest pain. 25 tablet 3   pantoprazole (PROTONIX) 40 MG tablet Take 1 tablet (40 mg total) by mouth daily. 90 tablet 1   Probiotic Product (PROBIOTIC DAILY PO) Take 1 tablet by mouth daily.     vitamin C (ASCORBIC ACID) 250 MG tablet Take 250 mg by mouth daily.     zinc gluconate 50 MG tablet Take 1 tablet (50 mg total) by mouth daily.     No current facility-administered medications for this visit.    Medication Side Effects: None  Allergies:  Allergies  Allergen Reactions   Atorvastatin Other (See Comments)    Calf pain.   Zolpidem Other (See Comments)    Other reaction(s): Drowsy    Past Medical History:  Diagnosis Date   Aortic insufficiency    Arthritis    Asthma    Cervical disc disease    Depression    Diastolic dysfunction 3/61/4431   Hyperlipidemia 08/24/2015   Hypertension    Lumbar disc disease    Psoriasis     Past Medical History, Surgical history, Social history, and Family history were reviewed and updated as appropriate.   Please see review of systems for further details on the patient's review from today.   Objective:   Physical Exam:  There were no vitals taken for this visit.  Physical Exam Constitutional:      General: He is not in acute distress. Musculoskeletal:        General: No deformity.  Neurological:     Mental Status: He is alert and oriented to person, place, and time.     Coordination: Coordination normal.  Psychiatric:        Attention and Perception: Attention and perception normal. He does not perceive auditory or visual hallucinations.        Mood and Affect: Mood normal. Mood is not anxious or depressed. Affect is not labile, blunt, angry or inappropriate.        Speech: Speech normal.        Behavior: Behavior  normal.        Thought Content: Thought content normal. Thought content is not paranoid or delusional. Thought content does not include homicidal or suicidal ideation. Thought content does not include homicidal or suicidal plan.        Cognition and Memory: Cognition and memory normal.        Judgment: Judgment normal.     Comments: Insight intact    Lab Review:     Component Value Date/Time   NA 138 11/08/2020 1350   NA 144 10/18/2020 1130   K 3.9 11/08/2020 1350   CL 106 11/08/2020 1350   CO2 24 11/08/2020 1350   GLUCOSE 87 11/08/2020 1350   BUN 24 (H) 11/08/2020  1350   BUN 20 10/18/2020 1130   CREATININE 1.39 (H) 11/08/2020 1350   CALCIUM 9.6 11/08/2020 1350   PROT 6.6 10/18/2020 1130   ALBUMIN 4.7 10/18/2020 1130   AST 20 10/18/2020 1130   ALT 21 10/18/2020 1130   ALKPHOS 89 10/18/2020 1130   BILITOT 0.7 10/18/2020 1130   GFRNONAA 55 (L) 11/08/2020 1350   GFRAA >60 01/01/2020 0324       Component Value Date/Time   WBC 6.9 11/08/2020 1350   RBC 5.19 11/08/2020 1350   HGB 15.0 11/08/2020 1350   HGB 16.7 12/09/2016 1537   HCT 43.5 11/08/2020 1350   HCT 46.9 12/09/2016 1537   PLT 145 (L) 11/08/2020 1350   PLT 141 12/09/2016 1537   MCV 83.8 11/08/2020 1350   MCV 87.3 12/09/2016 1537   MCH 28.9 11/08/2020 1350   MCHC 34.5 11/08/2020 1350   RDW 12.9 11/08/2020 1350   RDW 13.6 12/09/2016 1537   LYMPHSABS 2.3 11/08/2020 1350   LYMPHSABS 1.5 12/09/2016 1537   MONOABS 0.6 11/08/2020 1350   MONOABS 0.4 12/09/2016 1537   EOSABS 0.4 11/08/2020 1350   EOSABS 0.3 12/09/2016 1537   BASOSABS 0.0 11/08/2020 1350   BASOSABS 0.0 12/09/2016 1537    No results found for: POCLITH, LITHIUM   No results found for: PHENYTOIN, PHENOBARB, VALPROATE, CBMZ   .res Assessment: Plan:    Plan:  PDMP reviewed  1. Lamictal 200mg  twice daily. 2. Decrease Buspar 20mg  to 15mg  TID   3. Decrease Latuda 40mg  to 20mg  daily - samples given 4. Increase Pristiq 50mg  to 100mg  every  morning  RTC 8 weeks  Patient advised to contact office with any questions, adverse effects, or acute worsening in signs and symptoms.  Counseled patient regarding potential benefits, risks, and side effects of Lamictal to include potential risk of Stevens-Johnson syndrome. Advised patient to stop taking Lamictal and contact office immediately if rash develops and to seek urgent medical attention if rash is severe and/or spreading quickly.    Diagnoses and all orders for this visit:  Situational anxiety -     desvenlafaxine (PRISTIQ) 100 MG 24 hr tablet; Take 1 tablet (100 mg total) by mouth daily. -     busPIRone (BUSPAR) 15 MG tablet; Take 1 tablet (15 mg total) by mouth 3 (three) times daily.  Episodic mood disorder (HCC) -     desvenlafaxine (PRISTIQ) 100 MG 24 hr tablet; Take 1 tablet (100 mg total) by mouth daily. -     lamoTRIgine (LAMICTAL) 200 MG tablet; Take 1 tablet (200 mg total) by mouth 2 (two) times daily. -     lurasidone (LATUDA) 40 MG TABS tablet; Take 1 tablet (40 mg total) by mouth daily with breakfast.  GAD (generalized anxiety disorder) -     desvenlafaxine (PRISTIQ) 100 MG 24 hr tablet; Take 1 tablet (100 mg total) by mouth daily. -     busPIRone (BUSPAR) 15 MG tablet; Take 1 tablet (15 mg total) by mouth 3 (three) times daily.  Posttraumatic stress disorder with delayed expression -     desvenlafaxine (PRISTIQ) 100 MG 24 hr tablet; Take 1 tablet (100 mg total) by mouth daily.     Please see After Visit Summary for patient specific instructions.  Future Appointments  Date Time Provider [place]  07/08/2021  4:20 PM Skeet Latch, MD [facility]  08/05/2021 10:20 AM Shaelee Forni, Berdie Ogren, NP [facility] None    No orders of the defined types were placed in this  encounter.   -------------------------------

## 2021-06-20 DIAGNOSIS — R7303 Prediabetes: Secondary | ICD-10-CM | POA: Diagnosis not present

## 2021-06-20 DIAGNOSIS — I2510 Atherosclerotic heart disease of native coronary artery without angina pectoris: Secondary | ICD-10-CM | POA: Diagnosis not present

## 2021-06-20 DIAGNOSIS — E559 Vitamin D deficiency, unspecified: Secondary | ICD-10-CM | POA: Diagnosis not present

## 2021-06-20 DIAGNOSIS — I10 Essential (primary) hypertension: Secondary | ICD-10-CM | POA: Diagnosis not present

## 2021-06-20 DIAGNOSIS — D696 Thrombocytopenia, unspecified: Secondary | ICD-10-CM | POA: Diagnosis not present

## 2021-06-20 DIAGNOSIS — Z125 Encounter for screening for malignant neoplasm of prostate: Secondary | ICD-10-CM | POA: Diagnosis not present

## 2021-06-20 DIAGNOSIS — K760 Fatty (change of) liver, not elsewhere classified: Secondary | ICD-10-CM | POA: Diagnosis not present

## 2021-06-20 DIAGNOSIS — E7800 Pure hypercholesterolemia, unspecified: Secondary | ICD-10-CM | POA: Diagnosis not present

## 2021-06-20 DIAGNOSIS — K7460 Unspecified cirrhosis of liver: Secondary | ICD-10-CM | POA: Diagnosis not present

## 2021-06-25 ENCOUNTER — Other Ambulatory Visit: Payer: Self-pay | Admitting: *Deleted

## 2021-06-25 MED ORDER — EPINEPHRINE 0.3 MG/0.3ML IJ SOAJ: 0.3000 mg | Freq: Once | INTRAMUSCULAR | 1 refills | Status: AC

## 2021-06-27 ENCOUNTER — Other Ambulatory Visit: Payer: Self-pay | Admitting: Adult Health

## 2021-06-27 DIAGNOSIS — F4310 Post-traumatic stress disorder, unspecified: Secondary | ICD-10-CM

## 2021-06-27 DIAGNOSIS — R7989 Other specified abnormal findings of blood chemistry: Secondary | ICD-10-CM | POA: Diagnosis not present

## 2021-06-27 DIAGNOSIS — K760 Fatty (change of) liver, not elsewhere classified: Secondary | ICD-10-CM | POA: Diagnosis not present

## 2021-06-27 DIAGNOSIS — E559 Vitamin D deficiency, unspecified: Secondary | ICD-10-CM | POA: Diagnosis not present

## 2021-06-27 DIAGNOSIS — F39 Unspecified mood [affective] disorder: Secondary | ICD-10-CM

## 2021-06-27 DIAGNOSIS — D696 Thrombocytopenia, unspecified: Secondary | ICD-10-CM | POA: Diagnosis not present

## 2021-06-27 DIAGNOSIS — F418 Other specified anxiety disorders: Secondary | ICD-10-CM

## 2021-06-27 DIAGNOSIS — F411 Generalized anxiety disorder: Secondary | ICD-10-CM

## 2021-06-27 DIAGNOSIS — E7800 Pure hypercholesterolemia, unspecified: Secondary | ICD-10-CM | POA: Diagnosis not present

## 2021-06-27 DIAGNOSIS — R7303 Prediabetes: Secondary | ICD-10-CM | POA: Diagnosis not present

## 2021-06-27 DIAGNOSIS — I10 Essential (primary) hypertension: Secondary | ICD-10-CM | POA: Diagnosis not present

## 2021-06-27 DIAGNOSIS — K7460 Unspecified cirrhosis of liver: Secondary | ICD-10-CM | POA: Diagnosis not present

## 2021-06-27 DIAGNOSIS — I2510 Atherosclerotic heart disease of native coronary artery without angina pectoris: Secondary | ICD-10-CM | POA: Diagnosis not present

## 2021-06-28 ENCOUNTER — Other Ambulatory Visit: Payer: Self-pay

## 2021-06-28 ENCOUNTER — Other Ambulatory Visit: Payer: Self-pay | Admitting: Adult Health

## 2021-06-28 DIAGNOSIS — F39 Unspecified mood [affective] disorder: Secondary | ICD-10-CM

## 2021-06-28 DIAGNOSIS — F4310 Post-traumatic stress disorder, unspecified: Secondary | ICD-10-CM

## 2021-06-28 DIAGNOSIS — F411 Generalized anxiety disorder: Secondary | ICD-10-CM

## 2021-06-28 DIAGNOSIS — F418 Other specified anxiety disorders: Secondary | ICD-10-CM

## 2021-06-28 DIAGNOSIS — R7989 Other specified abnormal findings of blood chemistry: Secondary | ICD-10-CM | POA: Diagnosis not present

## 2021-06-28 MED ORDER — DESVENLAFAXINE SUCCINATE ER 100 MG PO TB24: 100.0000 mg | ORAL_TABLET | Freq: Every day | ORAL | 1 refills | Status: DC

## 2021-06-28 NOTE — Telephone Encounter (Signed)
I'm not sure what needs to be done for the [place], or what has already been done.  RF has been sent to the [place], but I also sent an Rx to [facility] for the 100 mg dosing. I searched in Cover My Meds and didn't see anything.  ?

## 2021-06-28 NOTE — Telephone Encounter (Signed)
RF was sent to [facility] as requested.  ?

## 2021-06-28 NOTE — Telephone Encounter (Signed)
Please also approve the RF to [facility] for now on [place] ?For [place]- did we get info from the [place] on what they need in order for the [facility] to be able to fill the Pristiq since it is non-formulary? He is needing [place] to follow up to get this going. ?C- [phone]  H [phone] ?

## 2021-07-01 ENCOUNTER — Encounter (HOSPITAL_BASED_OUTPATIENT_CLINIC_OR_DEPARTMENT_OTHER): Payer: Self-pay | Admitting: Cardiovascular Disease

## 2021-07-01 NOTE — Telephone Encounter (Signed)
Call to clarify with patient.

## 2021-07-01 NOTE — Telephone Encounter (Signed)
LVM for patient to [facility].

## 2021-07-01 NOTE — Telephone Encounter (Addendum)
Patient said the [facility] was supposed to send [place] paperwork to approve the Pristiq. He asked that we call and answer their questions to get it approved. [facility] - [phone] Ext. [phone]  I called the listed number, but they close at 4:15.  ?

## 2021-07-01 NOTE — Telephone Encounter (Signed)
Labs via [facility]: ? ?06/28/2021 creat 1.46, GFR 51 ?06/20/2021 AST 23, total cholesterol 114, HDL32, LDL 57, triglycerides 140 ? ?Loel Dubonnet, NP  ?

## 2021-07-02 ENCOUNTER — Telehealth: Payer: Self-pay

## 2021-07-02 NOTE — Telephone Encounter (Signed)
Prior Authorization information faxed to [facility] for PRISTIQ 100 MG, form completed and office notes sent. Pending response.  ? ? ?[phone] f ?[phone] ext [phone] ? ?

## 2021-07-02 NOTE — Telephone Encounter (Signed)
I have received a fax so I will complete and fax back. thanks ?

## 2021-07-08 ENCOUNTER — Ambulatory Visit (INDEPENDENT_AMBULATORY_CARE_PROVIDER_SITE_OTHER): Payer: Medicare Other | Admitting: Cardiovascular Disease

## 2021-07-08 ENCOUNTER — Other Ambulatory Visit: Payer: Self-pay

## 2021-07-08 VITALS — BP 110/66 | HR 60 | Ht 72.0 in | Wt 189.4 lb

## 2021-07-08 DIAGNOSIS — E782 Mixed hyperlipidemia: Secondary | ICD-10-CM

## 2021-07-08 DIAGNOSIS — R0989 Other specified symptoms and signs involving the circulatory and respiratory systems: Secondary | ICD-10-CM | POA: Diagnosis not present

## 2021-07-08 DIAGNOSIS — I2510 Atherosclerotic heart disease of native coronary artery without angina pectoris: Secondary | ICD-10-CM

## 2021-07-08 DIAGNOSIS — I10 Essential (primary) hypertension: Secondary | ICD-10-CM | POA: Diagnosis not present

## 2021-07-08 DIAGNOSIS — I7121 Aneurysm of the ascending aorta, without rupture: Secondary | ICD-10-CM

## 2021-07-08 DIAGNOSIS — E7800 Pure hypercholesterolemia, unspecified: Secondary | ICD-10-CM

## 2021-07-08 NOTE — Progress Notes (Incomplete)
Cardiology Office Note  Date:  07/08/2021  ID:  Tirth Cothron, DOB 1950/10/21, MRN [id number]  PCP:  Vernie Shanks, MD  Cardiologist:   Madelin Rear   No chief complaint on file.   History of Present Illness: Arthur Moore is a 71 y.o. male with mild CAD, mild-moderate aortic insufficiency, hypertension, asthma, TIA, OSA not on CPAP, liver fibrosis, anxiety, and depression who presents for follow up.  Arthur Moore wore a Holter monitor 06/2013 that revealed rare PACs and PVCs.  He previously followed up with a cardiologist in [place] due to mild AR.  He subsequently had an echo at the [facility] in 2015 that reported it as mild-moderate.  He had a repeat echocardiogram 12/05/15 revealed LVEF 55-60% with grade 2 diastolic. Aortic regurgitation remained mild to moderate.  Arthur Moore reported fatigue and atypical chest pain.  He was referred for an exercise Myoview 3/18 that revealed LVEF 49% and asynchronous contraction. There was a small defect in the basal inferior region that was thought to be diaphragmatic attenuation.  He achieved 7 METS on a Bruce protocol.  He called our office after these results due to persistent chest pain and it was suggested that he follow up with GI.  He was subsequently diagnosed with diverticulitis.  Arthur Moore saw [name], PA-C, for chest pain.  He had previously been seen in the ED with chest pain.  Cardiac enzymes and CT of the chest, abdomen, and pelvis were unremarkable.  His chest pain was atypical and occurred when he was trying to lie down in bed at night.  He was referred for coronary CT-A 04/2017 that revealed a coronary calcium score 361 which is 78th percentile.  He had extensive calcified plaque in the proximal LAD with possible moderate stenosis.  FFR was negative.  Repeat echo 08/2017 revealed LVEF 55 to 65% with grade 1 diastolic dysfunction.  He was seen in the ED 06/2018 with chest pain.  Cardiac enzymes were negative.  He underwent cardiac  catheterization 06/2018 which revealed 50% stenosis in D1 and otherwise 10 to 20% stenoses in the distal RCA, proximal left circumflex, distal left main, and proximal LAD.  Arthur Moore was started on Vascepa for hypertriglyceridemia and CAD.  It worked well for his triglycerides.  However he was unable to afford it.  He tried to get it filled through the [facility] and was denied.  Metoprolol succinate was also denied as were PCSK9 inhibitors.  Arthur Moore was seen by Coletta Memos on 04/2019 after a visit to the ED for chest pain.  His ischemia evaluation was unremarkable.  His chest pain was worse with inhalation.  He had no exertional chest pain.  No further work-up was performed.   He had blood work at the [facility] and the numbers were excellent.  His lipids were all at goal. At his last appointment he was doing well. He reported some LE heaviness that he attributed to deconditioning. He was seen in the ED 03/2021 after a head injury while playing pickleball. He is accompanied by a family member. Today, he has been feeling very fatigued constantly. He notes recently starting multiple medications with fatigue as a known side effect. Even after sleeping for 10-12 hours, he may need to take a nap 3 hours later. Normally his blood pressure ranges from 120/70-130/85. However he has not been monitoring this lately. He presents copies of recent blood work, including from the [facility] in [place]. His cholesterol is well controlled. He notes his  A1c increased to 5.8. He has been struggling more with monitoring his sugar and staying hydrated. They do try to follow a Mediterranean diet. Typically he exercises once a week for 2-3 hours without shortness of breath or other exertional symptoms. He enjoys playing pickleball. He denies any palpitations, chest pain, or peripheral edema. No lightheadedness, headaches, syncope, orthopnea, or PND.   Past Medical History:  Diagnosis Date   Aortic insufficiency    Arthritis    Asthma     Cervical disc disease    Depression    Diastolic dysfunction 6/75/9163   Hyperlipidemia 08/24/2015   Hypertension    Lumbar disc disease    Psoriasis     Past Surgical History:  Procedure Laterality Date   CHOLECYSTECTOMY  2012   HERNIA REPAIR     LEFT HEART CATH AND CORONARY ANGIOGRAPHY N/A 07/08/2018   Procedure: LEFT HEART CATH AND CORONARY ANGIOGRAPHY;  Surgeon: Burnell Blanks, MD;  Location: [facility];  Service: Cardiovascular;  Laterality: N/A;   PAROTIDECTOMY Right 1992   PROSTATE SURGERY  2002, 2004   SINOSCOPY     SINUS EXPLORATION  2013   SPINE SURGERY  2013   L C7-T1 laminectomy and discectomy   TONSILLECTOMY       Current Outpatient Medications  Medication Sig Dispense Refill   albuterol (VENTOLIN HFA) 108 (90 Base) MCG/ACT inhaler INHALE 2 PUFFS INTO THE LUNGS EVERY 4 (FOUR) HOURS AS NEEDED FOR WHEEZING OR SHORTNESS OF BREATH. 6.7 each 1   aspirin EC 81 MG tablet Take 81 mg by mouth daily.     budesonide (RHINOCORT AQUA) 32 MCG/ACT nasal spray Use two sprays in each nostril once daily 8.43 mL 5   budesonide-formoterol (SYMBICORT) 160-4.5 MCG/ACT inhaler Inhale 2 puffs into the lungs 2 (two) times daily.     busPIRone (BUSPAR) 15 MG tablet Take 1 tablet (15 mg total) by mouth 3 (three) times daily. 270 tablet 3   Cetirizine HCl (ZYRTEC ALLERGY PO) Take 1 tablet by mouth daily.     Cholecalciferol (VITAMIN D3) 5000 units TABS Take 5,000 Units by mouth daily.      desvenlafaxine (PRISTIQ) 100 MG 24 hr tablet Take 1 tablet (100 mg total) by mouth daily. 90 tablet 1   lamoTRIgine (LAMICTAL) 200 MG tablet Take 1 tablet (200 mg total) by mouth 2 (two) times daily. 180 tablet 3   lurasidone (LATUDA) 40 MG TABS tablet Take 1 tablet (40 mg total) by mouth daily with breakfast. 90 tablet 3   Magnesium Oxide (MAG-OXIDE) 200 MG TABS Take 2.5 tablets (500 mg total) by mouth daily. (Patient taking differently: Take 2 tablets by mouth daily.)  0   metoprolol  tartrate (LOPRESSOR) 25 MG tablet TAKE 1/2 TABLET BY MOUTH TWICE A DAY 90 tablet 3   Misc Natural Products (OSTEO BI-FLEX ADV JOINT SHIELD PO) Take 1 tablet by mouth daily.     Multiple Vitamin (MULTIVITAMIN) capsule Take 1 capsule by mouth daily.     pantoprazole (PROTONIX) 40 MG tablet Take 1 tablet (40 mg total) by mouth daily. 90 tablet 1   Probiotic Product (PROBIOTIC DAILY PO) Take 1 tablet by mouth daily.     rosuvastatin (CRESTOR) 10 MG tablet Take 1 tablet by mouth daily.     vitamin C (ASCORBIC ACID) 250 MG tablet Take 250 mg by mouth daily.     zinc gluconate 50 MG tablet Take 1 tablet (50 mg total) by mouth daily.     nitroGLYCERIN (NITROSTAT)  0.4 MG SL tablet Place 1 tablet (0.4 mg total) under the tongue every 5 (five) minutes as needed for chest pain. 25 tablet 3   No current facility-administered medications for this visit.    Allergies:   Atorvastatin and Zolpidem    Social History:  The patient  reports that he has never smoked. He has never used smokeless tobacco. He reports that he does not currently use alcohol. He reports that he does not use drugs.   Family History:  The patient's family history includes Bipolar disorder in his brother; Cancer in an other family member; Colon cancer in his mother; Lung cancer in his father.    ROS:   Please see the history of present illness. (+) Fatigue All other systems are reviewed and negative.    PHYSICAL EXAM: VS:  BP 110/66 (BP Location: Left Arm, Patient Position: Sitting, Cuff Size: Normal)    Pulse 60    Ht 6' (1.829 m)    Wt 189 lb 6.4 oz (85.9 kg)    BMI 25.69 kg/m  , BMI Body mass index is 25.69 kg/m. GENERAL:  Well appearing HEENT: Pupils equal round and reactive, fundi not visualized, oral mucosa unremarkable NECK:  No jugular venous distention, waveform within normal limits, carotid upstroke brisk and symmetric, no bruits LUNGS:  Clear to auscultation bilaterally HEART:  RRR.  PMI not displaced or sustained,S1  and S2 within normal limits, no S3, no S4, no clicks, no rubs, no murmurs ABD:  Flat, positive bowel sounds normal in frequency in pitch, no bruits, no rebound, no guarding, no midline pulsatile mass, no hepatomegaly, no splenomegaly EXT:  2 plus pulses throughout, no edema, no cyanosis no clubbing SKIN:  No rashes no nodules NEURO:  Cranial nerves II through XII grossly intact, motor grossly intact throughout PSYCH:  Cognitively intact, oriented to person place and time   EKG:   07/08/2021: Sinus rhythm. Rate 60 bpm. 01/01/2020: Sinus rhythm.  Rate 63 bpm. 03/16/2019: Sinus rhythm.  Rate 64 bpm. 12/29/17: Sinus rhythm.  Rate 61 bpm. 06/20/16: Sinus rhythm. Rate 64 bpm.   Echo 07/20/2020:  1. Left ventricular ejection fraction, by estimation, is 50 to 55%. The  left ventricle has low normal function. The left ventricle has no regional  wall motion abnormalities. The left ventricular internal cavity size was  mildly dilated. Left ventricular  diastolic parameters are consistent with Grade I diastolic dysfunction  (impaired relaxation).   2. Right ventricular systolic function is normal. The right ventricular  size is normal.   3. The mitral valve is normal in structure. Trivial mitral valve  regurgitation. No evidence of mitral stenosis.   4. The aortic valve is tricuspid. Aortic valve regurgitation is mild.  Mild aortic valve sclerosis is present, with no evidence of aortic valve  stenosis.   5. The inferior vena cava is normal in size with greater than 50%  respiratory variability, suggesting right atrial pressure of 3 mmHg.   CT Chest 04/11/2020: FINDINGS: Cardiovascular: Limited due to lack of IV contrast. Atherosclerotic calcifications are noted. No aneurysmal dilatation is seen. No cardiac enlargement is noted. Coronary calcifications are seen. The pulmonary artery as visualized shows no significant enlargement.   Mediastinum/Nodes: Thoracic inlet shows no focal abnormality.  No sizable hilar or mediastinal adenopathy is noted. The esophagus is within normal limits.   Lungs/Pleura: Lungs are well aerated bilaterally. Mild emphysematous changes are seen. Previously seen nodule is felt to represent focal scarring as it was present on a prior  CT examination from 2019 and improved aeration in this region on the current exam chose less parenchymal density. No sizable nodules are seen. No effusion or pneumothorax is noted. 2   Upper Abdomen: Visualized upper abdomen again demonstrates cystic changes within the right kidney stable from the previous exams.   Musculoskeletal: Degenerative changes of the thoracic spine are noted.   IMPRESSION: Previously seen suspicious nodular density is confirmed to represent focal scarring stable in appearance from a prior CT examination from 2019 as well as improved aeration in the bases showing left parenchymal density. These changes are likely related to adjacent osteophytic change within the thoracic spine.   No acute abnormality noted.   Aortic Atherosclerosis (ICD10-I70.0) and Emphysema (ICD10-J43.9).  LE Venous Doppler 02/06/2020: Summary:  RIGHT:  - No evidence of common femoral vein obstruction.     LEFT:  - There is no evidence of deep vein thrombosis in the lower extremity.   LHC 07/08/18: Dist RCA lesion is 20% stenosed. Prox Cx lesion is 10% stenosed. Dist LM to Ost LAD lesion is 20% stenosed. Ost 1st Diag lesion is 50% stenosed. The left ventricular systolic function is normal. LV end diastolic pressure is normal. The left ventricular ejection fraction is 50-55% by visual estimate. There is no mitral valve regurgitation. Prox LAD lesion is 20% stenosed.   1. Mild non-obstructive CAD 2. Low normal LV systolic function  Echo 07/31/79: Study Conclusions   - Left ventricle: The cavity size was normal. There was mild   concentric hypertrophy. Systolic function was normal. The   estimated ejection  fraction was in the range of 55% to 65%. Wall   motion was normal; there were no regional wall motion   abnormalities. Doppler parameters are consistent with abnormal   left ventricular relaxation (grade 1 diastolic dysfunction). - Aortic valve: There was mild to moderate regurgitation directed   eccentrically to the anterior leaflet of the mitral valve. - Left atrium: The atrium was normal in size. - Right ventricle: Systolic function was normal. - Pulmonary arteries: Systolic pressure was within the normal   range.  Coronary CT-A 05/15/17: IMPRESSION: 1. Coronary artery calcium score 361 Agatston units, placing the patient in the 78th percentile for age and gender, suggesting intermediate to high risk for future cardiac events.   2. Extensive calcified plaque in the proximal LAD, possible moderate stenosis though blooming artifact may make the disease look worse than it actually is.  FFR 0.83 in mid LAD. I do no think there is hemodynamically significant disease in the LAD. The LCx and RCA have no hemodynamically significant disease.  Exercise Myoview 06/26/16: Nuclear stress EF: 49%. Asynchronous contraction. The left ventricular ejection fraction is mildly decreased (45-54%). There was no ST segment deviation noted during stress. Defect 1: There is a small defect of mild severity present in the basal inferior location. Likely diaphragmatic attenuation artifact. This is a low risk study. No ischemia identified  Echo 12/05/15: Study Conclusions   - Left ventricle: Global LV longitudinal strain is normal at -16.2%   There is a false tendon in the LV apex of no clinical   significance. The cavity size was normal. There was moderate   focal basal hypertrophy. Systolic function was normal. The   estimated ejection fraction was in the range of 55% to 60%. Wall   motion was normal; there were no regional wall motion   abnormalities. Features are consistent with a pseudonormal left    ventricular filling pattern, with concomitant abnormal relaxation  and increased filling pressure (grade 2 diastolic dysfunction). - Aortic valve: Trileaflet; mildly thickened leaflets. There was   mild to moderate regurgitation directed eccentrically in the LVOT   and towards the mitral anterior leaflet. - Mitral valve: There was mild regurgitation. - Pulmonic valve: There was mild regurgitation.  Echo 04/25/14: LVEF 60%.  Aortic valve sclerosis without stenosis. Mild aortic insufficiency.  ETT 04/2013:  Exercise 8 minutes and 44 seconds on a Bruce protocol. Peak heart rate 136. 9.8 METS. No ischemic changes.  24 Hour Holter: Underlying rhythm is sinus. Average heart rate 74, minimum 52, max 123. Rare PACs and PVCs. No significant arrhythmia noted.   Recent Labs: 10/18/2020: ALT 21 11/08/2020: BUN 24; Creatinine, Ser 1.39; Hemoglobin 15.0; Platelets 145; Potassium 3.9; Sodium 138   Lipid Panel    Component Value Date/Time   CHOL 181 10/18/2020 1130   TRIG 165 (H) 10/18/2020 1130   HDL 29 (L) 10/18/2020 1130   CHOLHDL 6.2 (H) 10/18/2020 1130   CHOLHDL 4.1 07/09/2018 0407   VLDL 25 07/09/2018 0407   LDLCALC 122 (H) 10/18/2020 1130      11/2014: Total cholesterol 130, HDL 39, LDL 65, triglycerides 116 Total chol 133, tri 211, ldl 55, hdl 34  Wt Readings from Last 3 Encounters:  07/08/21 189 lb 6.4 oz (85.9 kg)  02/12/21 180 lb (81.6 kg)  12/07/20 180 lb (81.6 kg)      ASSESSMENT AND PLAN:  No problem-specific Assessment & Plan notes found for this encounter.   # Moderate, non-obstructive CAD:  # Hyperlipidemia:  # Atypical chest pain:  Left heart cath revealed 50% ostial D1 disease and otherwise minimal, non-obstructive CAD.  Continue medical management with aspirin, metoprolol, and Praluent.  Lipids are checked at the [facility] and well-controlled.  He will hold rosuvastatin for two weeks to see if it helps his legs.  If his symptoms are improved, continue to hold and repeat  lipids in a couple months to ensure lipids remain at goal.  He is doing very well and has no exertional chest pain.  His arm discomfort was radicular pain from his neck.   # Aortic regurgitation: # Mild aortic aneurysm:   Stable on echo 11/2015 and 08/2017.  Repeat echo.  BP has been low due to diet and weight loss.  If aorta is stable, reduce metoprolol to 12.'5mg'$  daily.  # Diastolic dysfunction:  # Hypertension:  Grade 2 on echo improved to grade 1 diastolic dysfunction on repeat.  He is euvolemic and blood pressure is well-controlled.  Continue metoprolol.  # TIA: Continue aspirin 81 mg daily and lipid control as above.  # Depression: # Anxiety:   Symptoms improving with new therapy.  His mood continues to be much better.  Continue exercise.  # Advanced directives:  Patient presented AD paperwork including signed DNR form.  Confirmed that he did not want to be DNR but does not want prolonged ventilation or living in a vegetative state.   Current medicines are reviewed at length with the patient today.  The patient does not have concerns regarding medicines.  The following changes have been made: none  Labs/ tests ordered today include:   No orders of the defined types were placed in this encounter.   Disposition:   FU with Tiffany C. Oval Linsey, MD in 1 year.  I,Mathew Stumpf,acting as a Education administrator for Skeet Latch, MD.,have documented all relevant documentation on the behalf of Skeet Latch, MD,as directed by  Skeet Latch, MD while in the  presence of Skeet Latch, MD.  ***  Signed, Madelin Rear  07/08/2021 5:00 PM    [place]

## 2021-07-08 NOTE — Patient Instructions (Addendum)
Medication Instructions:  ?Your physician recommends that you continue on your current medications as directed. Please refer to the Current Medication list given to you today.  ? ?*If you need a refill on your cardiac medications before your next appointment, please call your pharmacy* ? ?Lab Work: ?NONE ? ?Testing/Procedures: ?Your physician has requested that you have a carotid duplex. This test is an ultrasound of the carotid arteries in your neck. It looks at blood flow through these arteries that supply the brain with blood. Allow one hour for this exam. There are no restrictions or special instructions. ?07/22/2021 10:00 PM  ? ?Follow-Up: ?At [facility], you and your health needs are our priority.  As part of our continuing mission to provide you with exceptional heart care, we have created designated Provider Care Teams.  These Care Teams include your primary Cardiologist (physician) and Advanced Practice Providers (APPs -  Physician Assistants and Nurse Practitioners) who all work together to provide you with the care you need, when you need it. ? ?We recommend signing up for the patient portal called "MyChart".  Sign up information is provided on this After Visit Summary.  MyChart is used to connect with patients for Virtual Visits (Telemedicine).  Patients are able to view lab/test results, encounter notes, upcoming appointments, etc.  Non-urgent messages can be sent to your provider as well.   ?To learn more about what you can do with MyChart, go to [email or web address].   ? ?Your next appointment:   ?12 month(s) ? ?The format for your next appointment:   ?In Person ? ?Provider:   ?Skeet Latch, MD{ ? ?

## 2021-07-09 ENCOUNTER — Encounter (HOSPITAL_BASED_OUTPATIENT_CLINIC_OR_DEPARTMENT_OTHER): Payer: Self-pay | Admitting: Cardiovascular Disease

## 2021-07-09 DIAGNOSIS — R0989 Other specified symptoms and signs involving the circulatory and respiratory systems: Secondary | ICD-10-CM

## 2021-07-09 HISTORY — DX: Other specified symptoms and signs involving the circulatory and respiratory systems: R09.89

## 2021-07-09 NOTE — Assessment & Plan Note (Signed)
Mild to moderate mild-obstructive disease on cath in 2020.  He placed a global for 2 to 3 hours and has no cardiac symptoms.  He is doing very well from a cardiac standpoint.  Lipids are well controlled.  Continue aspirin, metoprolol, and rosuvastatin. ?

## 2021-07-09 NOTE — Assessment & Plan Note (Signed)
LDL was 57 on 05/2021.  Continue rosuvastatin.  He is doing a great job with diet and exercise. ?

## 2021-07-09 NOTE — Assessment & Plan Note (Signed)
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  Aorta is not dilated on echo in 2022. It has been as large as 3.9 cm on prior imaging.  Continue metoprolol. ?

## 2021-07-09 NOTE — Assessment & Plan Note (Signed)
His blood pressure is very well controlled and he is only on metoprolol.  Continue metoprolol for CAD. ?

## 2021-07-09 NOTE — Assessment & Plan Note (Signed)
Check carotid Dopplers °

## 2021-07-17 ENCOUNTER — Encounter: Payer: Self-pay | Admitting: Adult Health

## 2021-07-17 ENCOUNTER — Ambulatory Visit (INDEPENDENT_AMBULATORY_CARE_PROVIDER_SITE_OTHER): Payer: Medicare Other | Admitting: Adult Health

## 2021-07-17 ENCOUNTER — Other Ambulatory Visit: Payer: Self-pay

## 2021-07-17 DIAGNOSIS — F411 Generalized anxiety disorder: Secondary | ICD-10-CM | POA: Diagnosis not present

## 2021-07-17 DIAGNOSIS — F418 Other specified anxiety disorders: Secondary | ICD-10-CM | POA: Diagnosis not present

## 2021-07-17 DIAGNOSIS — F4310 Post-traumatic stress disorder, unspecified: Secondary | ICD-10-CM

## 2021-07-17 DIAGNOSIS — F39 Unspecified mood [affective] disorder: Secondary | ICD-10-CM

## 2021-07-17 NOTE — Progress Notes (Signed)
Yahmir Sokolov ?[id number] ?1950-11-09 ?71 y.o. ? ?Subjective:  ? ?Patient ID:  Arthur Moore is a 71 y.o. (DOB 1950-07-06) male. ? ?Chief Complaint: No chief complaint on file. ? ? ?HPI ?Arthur Moore presents to the office today for follow-up of Episodic Mood Disorder, PTSD, depression and situational anxiety. ? ?Describes mood today as "not so good". Pleasant. Decreased tearfulness. Denies depression, anxiety and irritability. Denies recent panic attacks. Reports decreased worry and intrusive thoughts. Stating "the intrusive thoughts are easier to dismiss but still there". Unable to identify a precipitant but feels like he is "barely functioning" - sleeping 15 hours a day and still feels exhausted. Falling asleep in the his chair and then napping again in the afternoons. Researched side effects of medications and is questioning if medications could be the cause. Also concerned about loss of libido - experiencing a lack of desire and physical symptoms. Discussed possible contributors with wife recovering from gallbladder surgery, family members with health issues, and the elimination of sugar from his diet. Recently attended talk therapy with Cornelia Copa and reports it was helpful and made him feel better. Varying interest and motivation. Taking medications as prescribed.  ?Energy levels stable. Active, has a regular exercise routine. Playing pickle ball twice a week - having knee issues. ?Enjoys some usual interests and activities. Married. Lives with wife - 4 children between them. Spending time with family. ?Appetite stable. Weight stable - 180 pounds. ?Sleeping better at nights. Averages 9 to 10 hours. ?Focus and concentration difficulties - starting and stopping tasks. Completing tasks. Managing aspects of household. Retired - [place] veteran. ?Denies SI or HI.  ?Denies AH or VH. ? ?Previous medication trials: Lexapro, Cymbalta, Xanax, Lamictal, Wellbutrin, Effexor, Paxil, Abilify ? ?OTC -  Melatonin ? ? ?PHQ2-9   ? ?Flowsheet Row Nutrition from 06/11/2017 [facility]  ?PHQ-2 Total Score 0  ? ?  ? ?[place] [facility] from 04/14/2021 in [facility] from 12/07/2020 in [facility] from 11/08/2020 in [facility]  ?C-SSRS RISK CATEGORY No Risk No Risk Error: Question 6 not populated  ? ?  ?  ? ?Review of Systems:  ?Review of Systems  ?Musculoskeletal:  Negative for gait problem.  ?Neurological:  Negative for tremors.  ?Psychiatric/Behavioral:    ?     Please refer to HPI  ? ?Medications: I have reviewed the patient's current medications. ? ?Current Outpatient Medications  ?Medication Sig Dispense Refill  ? albuterol (VENTOLIN HFA) 108 (90 Base) MCG/ACT inhaler INHALE 2 PUFFS INTO THE LUNGS EVERY 4 (FOUR) HOURS AS NEEDED FOR WHEEZING OR SHORTNESS OF BREATH. 6.7 each 1  ? aspirin EC 81 MG tablet Take 81 mg by mouth daily.    ? budesonide (RHINOCORT AQUA) 32 MCG/ACT nasal spray Use two sprays in each nostril once daily 8.43 mL 5  ? budesonide-formoterol (SYMBICORT) 160-4.5 MCG/ACT inhaler Inhale 2 puffs into the lungs 2 (two) times daily.    ? busPIRone (BUSPAR) 15 MG tablet Take 1 tablet (15 mg total) by mouth 3 (three) times daily. 270 tablet 3  ? Cetirizine HCl (ZYRTEC ALLERGY PO) Take 1 tablet by mouth daily.    ? Cholecalciferol (VITAMIN D3) 5000 units TABS Take 5,000 Units by mouth daily.     ? desvenlafaxine (PRISTIQ) 100 MG 24 hr tablet Take 1 tablet (100 mg total) by mouth daily. 90 tablet 1  ? lamoTRIgine (LAMICTAL) 200 MG tablet Take 1 tablet (200 mg  total) by mouth 2 (two) times daily. 180 tablet 3  ? lurasidone (LATUDA) 40 MG TABS tablet Take 1 tablet (40 mg total) by mouth daily with breakfast. 90 tablet 3  ? Magnesium Oxide (MAG-OXIDE) 200 MG TABS Take 2.5 tablets (500 mg total) by mouth daily. (Patient taking differently: Take 2 tablets by mouth daily.)  0  ? metoprolol  tartrate (LOPRESSOR) 25 MG tablet TAKE 1/2 TABLET BY MOUTH TWICE A DAY 90 tablet 3  ? Misc Natural Products (OSTEO BI-FLEX ADV JOINT SHIELD PO) Take 1 tablet by mouth daily.    ? Multiple Vitamin (MULTIVITAMIN) capsule Take 1 capsule by mouth daily.    ? nitroGLYCERIN (NITROSTAT) 0.4 MG SL tablet Place 1 tablet (0.4 mg total) under the tongue every 5 (five) minutes as needed for chest pain. 25 tablet 3  ? pantoprazole (PROTONIX) 40 MG tablet Take 1 tablet (40 mg total) by mouth daily. 90 tablet 1  ? Probiotic Product (PROBIOTIC DAILY PO) Take 1 tablet by mouth daily.    ? rosuvastatin (CRESTOR) 10 MG tablet Take 1 tablet by mouth daily.    ? vitamin C (ASCORBIC ACID) 250 MG tablet Take 250 mg by mouth daily.    ? zinc gluconate 50 MG tablet Take 1 tablet (50 mg total) by mouth daily.    ? ?No current facility-administered medications for this visit.  ? ? ?Medication Side Effects: None ? ?Allergies:  ?Allergies  ?Allergen Reactions  ? Atorvastatin Other (See Comments)  ?  Calf pain.  ? Zolpidem Other (See Comments)  ?  Other reaction(s): Drowsy  ? ? ?Past Medical History:  ?Diagnosis Date  ? Aortic insufficiency   ? Arthritis   ? Asthma   ? Carotid bruit 07/09/2021  ? Cervical disc disease   ? Depression   ? Diastolic dysfunction 9/73/5329  ? Hyperlipidemia 08/24/2015  ? Hypertension   ? Lumbar disc disease   ? Psoriasis   ? ? ?Past Medical History, Surgical history, Social history, and Family history were reviewed and updated as appropriate.  ? ?Please see review of systems for further details on the patient's review from today.  ? ?Objective:  ? ?Physical Exam:  ?There were no vitals taken for this visit. ? ?Physical Exam ?Constitutional:   ?   General: He is not in acute distress. ?Musculoskeletal:     ?   General: No deformity.  ?Neurological:  ?   Mental Status: He is alert and oriented to person, place, and time.  ?   Coordination: Coordination normal.  ?Psychiatric:     ?   Attention and Perception: Attention  and perception normal. He does not perceive auditory or visual hallucinations.     ?   Mood and Affect: Mood normal. Mood is not anxious or depressed. Affect is not labile, blunt, angry or inappropriate.     ?   Speech: Speech normal.     ?   Behavior: Behavior normal.     ?   Thought Content: Thought content normal. Thought content is not paranoid or delusional. Thought content does not include homicidal or suicidal ideation. Thought content does not include homicidal or suicidal plan.     ?   Cognition and Memory: Cognition and memory normal.     ?   Judgment: Judgment normal.  ?   Comments: Insight intact  ? ? ?Lab Review:  ?   ?Component Value Date/Time  ? NA 138 11/08/2020 1350  ? NA 144 10/18/2020 1130  ?  K 3.9 11/08/2020 1350  ? CL 106 11/08/2020 1350  ? CO2 24 11/08/2020 1350  ? GLUCOSE 87 11/08/2020 1350  ? BUN 24 (H) 11/08/2020 1350  ? BUN 20 10/18/2020 1130  ? CREATININE 1.39 (H) 11/08/2020 1350  ? CALCIUM 9.6 11/08/2020 1350  ? PROT 6.6 10/18/2020 1130  ? ALBUMIN 4.7 10/18/2020 1130  ? AST 20 10/18/2020 1130  ? ALT 21 10/18/2020 1130  ? ALKPHOS 89 10/18/2020 1130  ? BILITOT 0.7 10/18/2020 1130  ? GFRNONAA 55 (L) 11/08/2020 1350  ? GFRAA >60 01/01/2020 0324  ? ? ?   ?Component Value Date/Time  ? WBC 6.9 11/08/2020 1350  ? RBC 5.19 11/08/2020 1350  ? HGB 15.0 11/08/2020 1350  ? HGB 16.7 12/09/2016 1537  ? HCT 43.5 11/08/2020 1350  ? HCT 46.9 12/09/2016 1537  ? PLT 145 (L) 11/08/2020 1350  ? PLT 141 12/09/2016 1537  ? MCV 83.8 11/08/2020 1350  ? MCV 87.3 12/09/2016 1537  ? MCH 28.9 11/08/2020 1350  ? MCHC 34.5 11/08/2020 1350  ? RDW 12.9 11/08/2020 1350  ? RDW 13.6 12/09/2016 1537  ? LYMPHSABS 2.3 11/08/2020 1350  ? LYMPHSABS 1.5 12/09/2016 1537  ? MONOABS 0.6 11/08/2020 1350  ? MONOABS 0.4 12/09/2016 1537  ? EOSABS 0.4 11/08/2020 1350  ? EOSABS 0.3 12/09/2016 1537  ? BASOSABS 0.0 11/08/2020 1350  ? BASOSABS 0.0 12/09/2016 1537  ? ? ?No results found for: POCLITH, LITHIUM  ? ?No results found for:  PHENYTOIN, PHENOBARB, VALPROATE, CBMZ  ? ?.res ?Assessment: Plan:   ? ?Plan: ? ?PDMP reviewed ? ?Lamictal '200mg'$  twice daily. ?Buspar '20mg'$  to '15mg'$  TID   ?Pristiq '100mg'$  every morning ? ?Discontinue Latuda '20mg'$  daily - samples

## 2021-07-18 ENCOUNTER — Encounter (HOSPITAL_BASED_OUTPATIENT_CLINIC_OR_DEPARTMENT_OTHER): Payer: Self-pay | Admitting: Cardiovascular Disease

## 2021-07-22 ENCOUNTER — Encounter (HOSPITAL_BASED_OUTPATIENT_CLINIC_OR_DEPARTMENT_OTHER): Payer: Medicare Other

## 2021-07-23 ENCOUNTER — Ambulatory Visit (INDEPENDENT_AMBULATORY_CARE_PROVIDER_SITE_OTHER): Payer: Medicare Other

## 2021-07-23 ENCOUNTER — Other Ambulatory Visit: Payer: Self-pay

## 2021-07-23 DIAGNOSIS — R0989 Other specified symptoms and signs involving the circulatory and respiratory systems: Secondary | ICD-10-CM | POA: Diagnosis not present

## 2021-07-23 DIAGNOSIS — J309 Allergic rhinitis, unspecified: Secondary | ICD-10-CM | POA: Diagnosis not present

## 2021-08-05 ENCOUNTER — Ambulatory Visit: Payer: Medicare Other | Admitting: Adult Health

## 2021-08-05 ENCOUNTER — Telehealth: Payer: Self-pay | Admitting: Adult Health

## 2021-08-05 NOTE — Telephone Encounter (Signed)
Patient is asking for an electronic RF to be sent plus a hard copy to take to the [place]. He is also asking for his notes. Patient also saying [name] has a # at the [place] to call to tell them he needs to [employer]. He was given samples and it is not on his med list yet, but says he is taking 1.5 mg.  ?

## 2021-08-05 NOTE — Telephone Encounter (Signed)
Pt called requesting provider submit Vraylar Rx same as before. Also, requesting  a hard copy of Rx and notes stating he needs Vraylar. Call Pt when notes and Rx is available to pick up so he can hand deliver to [place].  ?

## 2021-08-05 NOTE — Telephone Encounter (Signed)
LVM to [facility] 

## 2021-08-05 NOTE — Telephone Encounter (Signed)
Pt LVM asking to talk to provider about a drug [facility] will pay 100%. Need info on this med. Contact # [phone] ?

## 2021-08-05 NOTE — Telephone Encounter (Signed)
Last month it appears you went thru this process with his Pristiq (see 3/3 note). You had received a fax from the [place] at that time.  ?

## 2021-08-05 NOTE — Telephone Encounter (Signed)
Noted yes I will pull that form  ?

## 2021-08-05 NOTE — Telephone Encounter (Signed)
Left another VM to clarify what needs to be done for Vraylar.  ?

## 2021-08-06 DIAGNOSIS — M25561 Pain in right knee: Secondary | ICD-10-CM | POA: Diagnosis not present

## 2021-08-06 DIAGNOSIS — S83241D Other tear of medial meniscus, current injury, right knee, subsequent encounter: Secondary | ICD-10-CM | POA: Diagnosis not present

## 2021-08-08 ENCOUNTER — Telehealth: Payer: Self-pay | Admitting: Adult Health

## 2021-08-08 NOTE — Telephone Encounter (Signed)
Paulette patient's spouse called with concerns about [place]. Tysen is requesting to be seen sooner due to disturbing hallucinations and sleep interruptions. He is scheduled for 4/19 with nothing sooner available. He is also questioning the [name] availability  because it is close to running out. A call back is requested today. # [phone]. ?

## 2021-08-08 NOTE — Telephone Encounter (Signed)
Called and spoke with patient.

## 2021-08-09 NOTE — Telephone Encounter (Signed)
See other phone message but spoke with [facility] and it's reported the Pristiq 100 mg was approved but still being processed for the patient.  ?

## 2021-08-09 NOTE — Telephone Encounter (Signed)
I have been trying to reach someone at the [facility] about his Pristiq since faxing a PA request on 07/02/2021, I am unable to speak to anyone when I call. I will continue to reach out.  ?

## 2021-08-09 NOTE — Telephone Encounter (Signed)
Noted. Ty!

## 2021-08-09 NOTE — Telephone Encounter (Signed)
Just got off the phone with [facility] and it has been approved but they are still processing it. Nothing for [place] to do at this time.  ?

## 2021-08-14 ENCOUNTER — Encounter: Payer: Self-pay | Admitting: Adult Health

## 2021-08-14 ENCOUNTER — Ambulatory Visit (INDEPENDENT_AMBULATORY_CARE_PROVIDER_SITE_OTHER): Payer: Medicare Other | Admitting: Adult Health

## 2021-08-14 DIAGNOSIS — F411 Generalized anxiety disorder: Secondary | ICD-10-CM

## 2021-08-14 DIAGNOSIS — F331 Major depressive disorder, recurrent, moderate: Secondary | ICD-10-CM

## 2021-08-14 DIAGNOSIS — F39 Unspecified mood [affective] disorder: Secondary | ICD-10-CM | POA: Diagnosis not present

## 2021-08-14 DIAGNOSIS — F4310 Post-traumatic stress disorder, unspecified: Secondary | ICD-10-CM | POA: Diagnosis not present

## 2021-08-14 NOTE — Progress Notes (Signed)
Courvoisier Hamblen ?[id number] ?1950/09/18 ?71 y.o. ? ?Subjective:  ? ?Patient ID:  Arthur Moore is a 71 y.o. (DOB [birth year]) male. ? ?Chief Complaint: No chief complaint on file. ? ? ?HPI ? ?Accompanied by wife. ? ?Arthur Moore presents to the office today for follow-up of Episodic Mood Disorder, PTSD, depression and situational anxiety. ? ?Describes mood today as "ok". Pleasant. Decreased tearfulness. Denies depression, anxiety and irritability. Denies recent panic attacks. Mood is consistent. Stating "I'm doing alright". Feels like medications are helpful. Working with therapist Cornelia Copa. Varying interest and motivation. Taking medications as prescribed.  ?Energy levels stable. Active, has a regular exercise routine. Playing pickle ball twice a week. ?Enjoys some usual interests and activities. Married. Lives with wife - 4 children between them. Spending time with family. ?Appetite stable. Weight stable - 180 pounds. ?Sleeps well at night. Averages 9 to 10 hours. Reports daytime napping. ?Focus and concentration improved - reading more. Completing tasks. Managing aspects of household. Retired - [place] veteran. ?Denies SI or HI.  ?Denies AH or VH. ? ?Previous medication trials: Lexapro, Cymbalta, Xanax, Lamictal, Wellbutrin, Effexor, Paxil, Abilify ? ?OTC - Melatonin ? ? ?PHQ2-9   ? ?Flowsheet Row Nutrition from 06/11/2017 in [facility]  ?PHQ-2 Total Score 0  ? ?  ? ?[place] [facility] from 04/14/2021 in [facility] from 12/07/2020 in [facility] from 11/08/2020 in [place]  ?C-SSRS RISK CATEGORY No Risk No Risk Error: Question 6 not populated  ? ?  ?  ? ?Review of Systems:  ?Review of Systems  ?Musculoskeletal:  Negative for gait problem.  ?Neurological:  Negative for tremors.  ?Psychiatric/Behavioral:    ?     Please refer to HPI  ? ?Medications: I have reviewed the patient's  current medications. ? ?Current Outpatient Medications  ?Medication Sig Dispense Refill  ? albuterol (VENTOLIN HFA) 108 (90 Base) MCG/ACT inhaler INHALE 2 PUFFS INTO THE LUNGS EVERY 4 (FOUR) HOURS AS NEEDED FOR WHEEZING OR SHORTNESS OF BREATH. 6.7 each 1  ? aspirin EC 81 MG tablet Take 81 mg by mouth daily.    ? budesonide (RHINOCORT AQUA) 32 MCG/ACT nasal spray Use two sprays in each nostril once daily 8.43 mL 5  ? budesonide-formoterol (SYMBICORT) 160-4.5 MCG/ACT inhaler Inhale 2 puffs into the lungs 2 (two) times daily.    ? busPIRone (BUSPAR) 15 MG tablet Take 1 tablet (15 mg total) by mouth 3 (three) times daily. 270 tablet 3  ? Cetirizine HCl (ZYRTEC ALLERGY PO) Take 1 tablet by mouth daily.    ? Cholecalciferol (VITAMIN D3) 5000 units TABS Take 5,000 Units by mouth daily.     ? desvenlafaxine (PRISTIQ) 100 MG 24 hr tablet Take 1 tablet (100 mg total) by mouth daily. 90 tablet 1  ? lamoTRIgine (LAMICTAL) 200 MG tablet Take 1 tablet (200 mg total) by mouth 2 (two) times daily. 180 tablet 3  ? Magnesium Oxide (MAG-OXIDE) 200 MG TABS Take 2.5 tablets (500 mg total) by mouth daily. (Patient taking differently: Take 2 tablets by mouth daily.)  0  ? metoprolol tartrate (LOPRESSOR) 25 MG tablet TAKE 1/2 TABLET BY MOUTH TWICE A DAY 90 tablet 3  ? Misc Natural Products (OSTEO BI-FLEX ADV JOINT SHIELD PO) Take 1 tablet by mouth daily.    ? Multiple Vitamin (MULTIVITAMIN) capsule Take 1 capsule by mouth daily.    ? nitroGLYCERIN (NITROSTAT) 0.4 MG SL tablet Place 1 tablet (0.4 mg  total) under the tongue every 5 (five) minutes as needed for chest pain. 25 tablet 3  ? pantoprazole (PROTONIX) 40 MG tablet Take 1 tablet (40 mg total) by mouth daily. 90 tablet 1  ? Probiotic Product (PROBIOTIC DAILY PO) Take 1 tablet by mouth daily.    ? rosuvastatin (CRESTOR) 10 MG tablet Take 1 tablet by mouth daily.    ? vitamin C (ASCORBIC ACID) 250 MG tablet Take 250 mg by mouth daily.    ? zinc gluconate 50 MG tablet Take 1 tablet (50  mg total) by mouth daily.    ? ?No current facility-administered medications for this visit.  ? ? ?Medication Side Effects: None ? ?Allergies:  ?Allergies  ?Allergen Reactions  ? Atorvastatin Other (See Comments)  ?  Calf pain. ?Other reaction(s): leb numbness  ? Zolpidem Other (See Comments)  ?  Other reaction(s): Drowsy  ? ? ?Past Medical History:  ?Diagnosis Date  ? Aortic insufficiency   ? Arthritis   ? Asthma   ? Carotid bruit 07/09/2021  ? Cervical disc disease   ? Depression   ? Diastolic dysfunction 09/05/2583  ? Hyperlipidemia 08/24/2015  ? Hypertension   ? Lumbar disc disease   ? Psoriasis   ? ? ?Past Medical History, Surgical history, Social history, and Family history were reviewed and updated as appropriate.  ? ?Please see review of systems for further details on the patient's review from today.  ? ?Objective:  ? ?Physical Exam:  ?There were no vitals taken for this visit. ? ?Physical Exam ?Constitutional:   ?   General: He is not in acute distress. ?Musculoskeletal:     ?   General: No deformity.  ?Neurological:  ?   Mental Status: He is alert and oriented to person, place, and time.  ?   Coordination: Coordination normal.  ?Psychiatric:     ?   Attention and Perception: Attention and perception normal. He does not perceive auditory or visual hallucinations.     ?   Mood and Affect: Mood normal. Mood is not anxious or depressed. Affect is not labile, blunt, angry or inappropriate.     ?   Speech: Speech normal.     ?   Behavior: Behavior normal.     ?   Thought Content: Thought content normal. Thought content is not paranoid or delusional. Thought content does not include homicidal or suicidal ideation. Thought content does not include homicidal or suicidal plan.     ?   Cognition and Memory: Cognition and memory normal.     ?   Judgment: Judgment normal.  ?   Comments: Insight intact  ? ? ?Lab Review:  ?   ?Component Value Date/Time  ? NA 138 11/08/2020 1350  ? NA 144 10/18/2020 1130  ? K 3.9 11/08/2020  1350  ? CL 106 11/08/2020 1350  ? CO2 24 11/08/2020 1350  ? GLUCOSE 87 11/08/2020 1350  ? BUN 24 (H) 11/08/2020 1350  ? BUN 20 10/18/2020 1130  ? CREATININE 1.39 (H) 11/08/2020 1350  ? CALCIUM 9.6 11/08/2020 1350  ? PROT 6.6 10/18/2020 1130  ? ALBUMIN 4.7 10/18/2020 1130  ? AST 20 10/18/2020 1130  ? ALT 21 10/18/2020 1130  ? ALKPHOS 89 10/18/2020 1130  ? BILITOT 0.7 10/18/2020 1130  ? GFRNONAA 55 (L) 11/08/2020 1350  ? GFRAA >60 01/01/2020 0324  ? ? ?   ?Component Value Date/Time  ? WBC 6.9 11/08/2020 1350  ? RBC 5.19 11/08/2020 1350  ? HGB  15.0 11/08/2020 1350  ? HGB 16.7 12/09/2016 1537  ? HCT 43.5 11/08/2020 1350  ? HCT 46.9 12/09/2016 1537  ? PLT 145 (L) 11/08/2020 1350  ? PLT 141 12/09/2016 1537  ? MCV 83.8 11/08/2020 1350  ? MCV 87.3 12/09/2016 1537  ? MCH 28.9 11/08/2020 1350  ? MCHC 34.5 11/08/2020 1350  ? RDW 12.9 11/08/2020 1350  ? RDW 13.6 12/09/2016 1537  ? LYMPHSABS 2.3 11/08/2020 1350  ? LYMPHSABS 1.5 12/09/2016 1537  ? MONOABS 0.6 11/08/2020 1350  ? MONOABS 0.4 12/09/2016 1537  ? EOSABS 0.4 11/08/2020 1350  ? EOSABS 0.3 12/09/2016 1537  ? BASOSABS 0.0 11/08/2020 1350  ? BASOSABS 0.0 12/09/2016 1537  ? ? ?No results found for: POCLITH, LITHIUM  ? ?No results found for: PHENYTOIN, PHENOBARB, VALPROATE, CBMZ  ? ?.res ?Assessment: Plan:   ? ?Plan: ? ?PDMP reviewed ? ?Lamictal '200mg'$  twice daily. ?Buspar '20mg'$  to '15mg'$  TID   ?Pristiq '100mg'$  every morning ?Vraylar 1.'5mg'$  daily - 5 weeks of samples given ? ?Seeing therapist. ? ?RTC 4 weeks ? ?Patient advised to contact office with any questions, adverse effects, or acute worsening in signs and symptoms. ? ?Counseled patient regarding potential benefits, risks, and side effects of Lamictal to include potential risk of Stevens-Johnson syndrome. Advised patient to stop taking Lamictal and contact office immediately if rash develops and to seek urgent medical attention if rash is severe and/or spreading quickly.  ? ?Diagnoses and all orders for this  visit: ? ?Episodic mood disorder ([place]) ? ?GAD (generalized anxiety disorder) ? ?Posttraumatic stress disorder with delayed expression ? ?Major depressive disorder, recurrent episode, moderate ([place]) ? ?  ? ?Please see [place]

## 2021-08-18 DIAGNOSIS — M25561 Pain in right knee: Secondary | ICD-10-CM | POA: Diagnosis not present

## 2021-08-19 ENCOUNTER — Ambulatory Visit (INDEPENDENT_AMBULATORY_CARE_PROVIDER_SITE_OTHER): Payer: Medicare Other

## 2021-08-19 DIAGNOSIS — J309 Allergic rhinitis, unspecified: Secondary | ICD-10-CM

## 2021-08-23 ENCOUNTER — Telehealth: Payer: Self-pay | Admitting: Adult Health

## 2021-08-23 NOTE — Telephone Encounter (Signed)
Just saw him a week ago and he was doing great. Should not be having those type of side effects with the medications. May have a viral infection and need to follow up with PCP. He can make an appointment to discuss if needed.

## 2021-08-23 NOTE — Telephone Encounter (Signed)
LVM to [facility]. According to chart patient had allergy injections 4/24.  ?

## 2021-08-23 NOTE — Telephone Encounter (Signed)
Pt called reporting in the last couple of days he is very dizzy, sleepy and involuntary muscle movement. Apt 5/18 Contact Pt @ [phone] ?

## 2021-08-23 NOTE — Telephone Encounter (Signed)
Please call patient to schedule an earlier appt.  ?
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
## 2021-08-27 ENCOUNTER — Other Ambulatory Visit: Payer: Self-pay

## 2021-08-27 DIAGNOSIS — F418 Other specified anxiety disorders: Secondary | ICD-10-CM

## 2021-08-27 DIAGNOSIS — F411 Generalized anxiety disorder: Secondary | ICD-10-CM

## 2021-08-27 MED ORDER — BUSPIRONE HCL 15 MG PO TABS: 15.0000 mg | ORAL_TABLET | Freq: Three times a day (TID) | ORAL | 3 refills | Status: DC

## 2021-08-28 ENCOUNTER — Telehealth: Payer: Self-pay | Admitting: Adult Health

## 2021-08-28 NOTE — Telephone Encounter (Signed)
Pt LVM at 1:52p.  He says that [name] told him that his [name] was approved, but he called to the [place] and they haven't seen a script for him.  I don't see it on his med list.  I saw previous visit notes where samples were given.  ? ?Pls send the script to [facility] at [phone] or fax it to [phone]. ? ?Next appt 5/18 ?

## 2021-08-28 NOTE — Telephone Encounter (Signed)
Please see patient's message.  I don't see any documentation in regards to [place] PA, but I know you have to go thru [place]. I don't see that an Rx has been sent either.  ?

## 2021-08-30 DIAGNOSIS — M25561 Pain in right knee: Secondary | ICD-10-CM | POA: Diagnosis not present

## 2021-08-30 DIAGNOSIS — M7631 Iliotibial band syndrome, right leg: Secondary | ICD-10-CM | POA: Diagnosis not present

## 2021-08-30 DIAGNOSIS — S83241D Other tear of medial meniscus, current injury, right knee, subsequent encounter: Secondary | ICD-10-CM | POA: Diagnosis not present

## 2021-09-02 DIAGNOSIS — R944 Abnormal results of kidney function studies: Secondary | ICD-10-CM | POA: Diagnosis not present

## 2021-09-03 NOTE — Telephone Encounter (Signed)
Patient rtc to office regarding prior message about Vraylar refill. He needs prescription sent to [place]. Please send to [place] at [facility] in [place] Ph: [place]. Fax [phone]. Patient would like a rtc to discuss. Ph: [phone] ?

## 2021-09-04 MED ORDER — CARIPRAZINE HCL 1.5 MG PO CAPS: 1.5000 mg | ORAL_CAPSULE | Freq: Every day | ORAL | 3 refills | Status: DC

## 2021-09-04 NOTE — Telephone Encounter (Signed)
Rx for Vraylar 1.5 mg qd sent to [place].  ?

## 2021-09-10 ENCOUNTER — Telehealth: Payer: Self-pay | Admitting: Adult Health

## 2021-09-10 NOTE — Telephone Encounter (Signed)
Please address or tell me what I need to do. He keeps calling.  ?

## 2021-09-10 NOTE — Telephone Encounter (Signed)
Refer to message 5/3. Pt called stating no rtc from last week. [name] has to go to [place] [facility] @ [place] for approval before pharmacy will fill.  ?Fax: [phone]   Phone: [phone] ext [phone]. We don't get a PA. Each Rx has to go to same [facility] @ [place] for approval. Contact pt @ [phone] ?

## 2021-09-11 NOTE — Telephone Encounter (Signed)
I discussed this with Tammy and he is coming in tomorrow so I will talk to him.  ?

## 2021-09-12 ENCOUNTER — Ambulatory Visit: Payer: Medicare Other | Admitting: Adult Health

## 2021-09-12 NOTE — Telephone Encounter (Signed)
Noted  

## 2021-09-12 NOTE — Telephone Encounter (Signed)
Pt wants in person apt. RS. Needs samples Vraylar. Contact Pt to pick up when ready.

## 2021-09-17 ENCOUNTER — Ambulatory Visit (INDEPENDENT_AMBULATORY_CARE_PROVIDER_SITE_OTHER): Payer: Medicare Other

## 2021-09-17 DIAGNOSIS — J309 Allergic rhinitis, unspecified: Secondary | ICD-10-CM

## 2021-09-19 NOTE — Telephone Encounter (Signed)
All information requested from [facility] is completed and faxed. [name] confirmed he received everything and will get it submitted on his end. He will contact us if anything else is needed.

## 2021-09-20 DIAGNOSIS — M25561 Pain in right knee: Secondary | ICD-10-CM | POA: Diagnosis not present

## 2021-09-25 DIAGNOSIS — J4540 Moderate persistent asthma, uncomplicated: Secondary | ICD-10-CM | POA: Diagnosis not present

## 2021-09-25 DIAGNOSIS — K219 Gastro-esophageal reflux disease without esophagitis: Secondary | ICD-10-CM | POA: Diagnosis not present

## 2021-09-25 DIAGNOSIS — I10 Essential (primary) hypertension: Secondary | ICD-10-CM | POA: Diagnosis not present

## 2021-09-25 DIAGNOSIS — E7800 Pure hypercholesterolemia, unspecified: Secondary | ICD-10-CM | POA: Diagnosis not present

## 2021-09-25 DIAGNOSIS — I2510 Atherosclerotic heart disease of native coronary artery without angina pectoris: Secondary | ICD-10-CM | POA: Diagnosis not present

## 2021-09-27 DIAGNOSIS — Z79899 Other long term (current) drug therapy: Secondary | ICD-10-CM | POA: Diagnosis not present

## 2021-10-03 DIAGNOSIS — S83241D Other tear of medial meniscus, current injury, right knee, subsequent encounter: Secondary | ICD-10-CM | POA: Diagnosis not present

## 2021-10-03 DIAGNOSIS — M7631 Iliotibial band syndrome, right leg: Secondary | ICD-10-CM | POA: Diagnosis not present

## 2021-10-08 ENCOUNTER — Telehealth (INDEPENDENT_AMBULATORY_CARE_PROVIDER_SITE_OTHER): Payer: Medicare Other | Admitting: Adult Health

## 2021-10-08 ENCOUNTER — Encounter: Payer: Self-pay | Admitting: Adult Health

## 2021-10-08 DIAGNOSIS — F4310 Post-traumatic stress disorder, unspecified: Secondary | ICD-10-CM

## 2021-10-08 DIAGNOSIS — F39 Unspecified mood [affective] disorder: Secondary | ICD-10-CM | POA: Diagnosis not present

## 2021-10-08 DIAGNOSIS — F331 Major depressive disorder, recurrent, moderate: Secondary | ICD-10-CM | POA: Diagnosis not present

## 2021-10-08 DIAGNOSIS — F418 Other specified anxiety disorders: Secondary | ICD-10-CM

## 2021-10-08 DIAGNOSIS — F411 Generalized anxiety disorder: Secondary | ICD-10-CM | POA: Diagnosis not present

## 2021-10-08 MED ORDER — BUPROPION HCL ER (XL) 150 MG PO TB24: 150.0000 mg | ORAL_TABLET | Freq: Every day | ORAL | 0 refills | Status: DC

## 2021-10-08 MED ORDER — DESVENLAFAXINE SUCCINATE ER 50 MG PO TB24: 100.0000 mg | ORAL_TABLET | Freq: Every day | ORAL | 0 refills | Status: DC

## 2021-10-08 MED ORDER — BUPROPION HCL ER (XL) 150 MG PO TB24: 150.0000 mg | ORAL_TABLET | Freq: Every morning | ORAL | 0 refills | Status: DC

## 2021-10-08 NOTE — Progress Notes (Signed)
Arthur Moore [id number] 02/03/1951 71 y.o.  Virtual Visit via Video Note  I connected with pt @ on 10/08/21 at  3:20 PM EDT by a video enabled telemedicine application and verified that I am speaking with the correct person using two identifiers.   I discussed the limitations of evaluation and management by telemedicine and the availability of in person appointments. The patient expressed understanding and agreed to proceed.  I discussed the assessment and treatment plan with the patient. The patient was provided an opportunity to ask questions and all were answered. The patient agreed with the plan and demonstrated an understanding of the instructions.   The patient was advised to call back or seek an in-person evaluation if the symptoms worsen or if the condition fails to improve as anticipated.  I provided 25 minutes of non-face-to-face time during this encounter.  The patient was located at home.  The provider was located at [place].   Aloha Gell, NP   Subjective:   Patient ID:  Arthur Moore is a 71 y.o. (DOB 1950-05-06) male.  Chief Complaint: No chief complaint on file.   HPI Arthur Moore presents for follow-up of Episodic Mood Disorder, PTSD, depression and situational anxiety.  Describes mood today as "ok". Pleasant. Decreased tearfulness. Denies depression, anxiety and irritability. Denies recent panic attacks. Mood is consistent. Stating "I'm feeling pretty good". Feels like medications are helpful. Concerned about loss of Libido - "I can't perform". Working with therapist Cornelia Copa. Varying interest and motivation. Taking medications as prescribed.  Energy levels stable. Active, unable to exercise with leg injury. Enjoys some usual interests and activities. Married. Lives with wife - 4 children between them. Spending time with family. Appetite stable. Weight gain - 10 pounds - 190 pounds. Sleeps well at night. Averages 9 to 10 hours.  Reports daytime napping. Focus and concentration improved. Completing tasks. Managing aspects of household. Retired - [place] veteran. Denies SI or HI.  Denies AH or VH.  Previous medication trials: Lexapro, Cymbalta, Xanax, Lamictal, Wellbutrin, Effexor, Paxil, Abilify   Review of Systems:  Review of Systems  Musculoskeletal:  Negative for gait problem.  Neurological:  Negative for tremors.  Psychiatric/Behavioral:         Please refer to HPI    Medications: I have reviewed the patient's current medications.  Current Outpatient Medications  Medication Sig Dispense Refill   albuterol (VENTOLIN HFA) 108 (90 Base) MCG/ACT inhaler INHALE 2 PUFFS INTO THE LUNGS EVERY 4 (FOUR) HOURS AS NEEDED FOR WHEEZING OR SHORTNESS OF BREATH. 6.7 each 1   aspirin EC 81 MG tablet Take 81 mg by mouth daily.     budesonide (RHINOCORT AQUA) 32 MCG/ACT nasal spray Use two sprays in each nostril once daily 8.43 mL 5   budesonide-formoterol (SYMBICORT) 160-4.5 MCG/ACT inhaler Inhale 2 puffs into the lungs 2 (two) times daily.     busPIRone (BUSPAR) 15 MG tablet Take 1 tablet (15 mg total) by mouth 3 (three) times daily. 270 tablet 3   cariprazine (VRAYLAR) 1.5 MG capsule Take 1 capsule (1.5 mg total) by mouth daily. 30 capsule 3   Cetirizine HCl (ZYRTEC ALLERGY PO) Take 1 tablet by mouth daily.     Cholecalciferol (VITAMIN D3) 5000 units TABS Take 5,000 Units by mouth daily.      desvenlafaxine (PRISTIQ) 100 MG 24 hr tablet Take 1 tablet (100 mg total) by mouth daily. 90 tablet 1   lamoTRIgine (LAMICTAL) 200 MG tablet Take 1 tablet (200 mg total) by mouth  2 (two) times daily. 180 tablet 3   Magnesium Oxide (MAG-OXIDE) 200 MG TABS Take 2.5 tablets (500 mg total) by mouth daily. (Patient taking differently: Take 2 tablets by mouth daily.)  0   metoprolol tartrate (LOPRESSOR) 25 MG tablet TAKE 1/2 TABLET BY MOUTH TWICE A DAY 90 tablet 3   Misc Natural Products (OSTEO BI-FLEX ADV JOINT SHIELD PO) Take 1 tablet by  mouth daily.     Multiple Vitamin (MULTIVITAMIN) capsule Take 1 capsule by mouth daily.     nitroGLYCERIN (NITROSTAT) 0.4 MG SL tablet Place 1 tablet (0.4 mg total) under the tongue every 5 (five) minutes as needed for chest pain. 25 tablet 3   pantoprazole (PROTONIX) 40 MG tablet Take 1 tablet (40 mg total) by mouth daily. 90 tablet 1   Probiotic Product (PROBIOTIC DAILY PO) Take 1 tablet by mouth daily.     rosuvastatin (CRESTOR) 10 MG tablet Take 1 tablet by mouth daily.     vitamin C (ASCORBIC ACID) 250 MG tablet Take 250 mg by mouth daily.     zinc gluconate 50 MG tablet Take 1 tablet (50 mg total) by mouth daily.     No current facility-administered medications for this visit.    Medication Side Effects: None  Allergies:  Allergies  Allergen Reactions   Atorvastatin Other (See Comments)    Calf pain. Other reaction(s): leb numbness   Zolpidem Other (See Comments)    Other reaction(s): Drowsy    Past Medical History:  Diagnosis Date   Aortic insufficiency    Arthritis    Asthma    Carotid bruit 07/09/2021   Cervical disc disease    Depression    Diastolic dysfunction 0/93/8182   Hyperlipidemia 08/24/2015   Hypertension    Lumbar disc disease    Psoriasis     Family History  Problem Relation Age of Onset   Colon cancer Mother    Lung cancer Father    Cancer Other    Bipolar disorder Brother    Allergic rhinitis Neg Hx    Angioedema Neg Hx    Asthma Neg Hx    Eczema Neg Hx     Social History   Socioeconomic History   Marital status: Married    Spouse name: Not on file   Number of children: Not on file   Years of education: Not on file   Highest education level: Not on file  Occupational History   Not on file  Tobacco Use   Smoking status: Never   Smokeless tobacco: Never  Vaping Use   Vaping Use: Never used  Substance and Sexual Activity   Alcohol use: Not Currently   Drug use: No   Sexual activity: Not on file  Other Topics Concern   Not on  file  Social History Narrative   Right handed   Lives with wife in tow story home   Social Determinants of Health   Financial Resource Strain: Not on file  Food Insecurity: Not on file  Transportation Needs: Not on file  Physical Activity: Not on file  Stress: Not on file  Social Connections: Not on file  Intimate Partner Violence: Not on file    Past Medical History, Surgical history, Social history, and Family history were reviewed and updated as appropriate.   Please see review of systems for further details on the patient's review from today.   Objective:   Physical Exam:  There were no vitals taken for this visit.  Physical Exam  Constitutional:      General: He is not in acute distress. Musculoskeletal:        General: No deformity.  Neurological:     Mental Status: He is alert and oriented to person, place, and time.     Coordination: Coordination normal.  Psychiatric:        Attention and Perception: Attention and perception normal. He does not perceive auditory or visual hallucinations.        Mood and Affect: Mood normal. Mood is not anxious or depressed. Affect is not labile, blunt, angry or inappropriate.        Speech: Speech normal.        Behavior: Behavior normal.        Thought Content: Thought content normal. Thought content is not paranoid or delusional. Thought content does not include homicidal or suicidal ideation. Thought content does not include homicidal or suicidal plan.        Cognition and Memory: Cognition and memory normal.        Judgment: Judgment normal.     Comments: Insight intact     Lab Review:     Component Value Date/Time   NA 138 11/08/2020 1350   NA 144 10/18/2020 1130   K 3.9 11/08/2020 1350   CL 106 11/08/2020 1350   CO2 24 11/08/2020 1350   GLUCOSE 87 11/08/2020 1350   BUN 24 (H) 11/08/2020 1350   BUN 20 10/18/2020 1130   CREATININE 1.39 (H) 11/08/2020 1350   CALCIUM 9.6 11/08/2020 1350   PROT 6.6 10/18/2020 1130    ALBUMIN 4.7 10/18/2020 1130   AST 20 10/18/2020 1130   ALT 21 10/18/2020 1130   ALKPHOS 89 10/18/2020 1130   BILITOT 0.7 10/18/2020 1130   GFRNONAA 55 (L) 11/08/2020 1350   GFRAA >60 01/01/2020 0324       Component Value Date/Time   WBC 6.9 11/08/2020 1350   RBC 5.19 11/08/2020 1350   HGB 15.0 11/08/2020 1350   HGB 16.7 12/09/2016 1537   HCT 43.5 11/08/2020 1350   HCT 46.9 12/09/2016 1537   PLT 145 (L) 11/08/2020 1350   PLT 141 12/09/2016 1537   MCV 83.8 11/08/2020 1350   MCV 87.3 12/09/2016 1537   MCH 28.9 11/08/2020 1350   MCHC 34.5 11/08/2020 1350   RDW 12.9 11/08/2020 1350   RDW 13.6 12/09/2016 1537   LYMPHSABS 2.3 11/08/2020 1350   LYMPHSABS 1.5 12/09/2016 1537   MONOABS 0.6 11/08/2020 1350   MONOABS 0.4 12/09/2016 1537   EOSABS 0.4 11/08/2020 1350   EOSABS 0.3 12/09/2016 1537   BASOSABS 0.0 11/08/2020 1350   BASOSABS 0.0 12/09/2016 1537    No results found for: "POCLITH", "LITHIUM"   No results found for: "PHENYTOIN", "PHENOBARB", "VALPROATE", "CBMZ"   .res Assessment: Plan:    Plan:  PDMP reviewed  Lamictal '200mg'$  twice daily Buspar '15mg'$  daily - has reduced dose from '45mg'$   Vraylar 1.'5mg'$  daily - side effects - drop of saliva twice an hour  Reduce Pristiq '100mg'$  to '50mg'$  every morning x 2 weeks, then D/C Add Wellbutrin XL '150mg'$  every morning - denies seizure history - has taken previously  Seeing therapist.  RTC 4 weeks  Patient advised to contact office with any questions, adverse effects, or acute worsening in signs and symptoms.  Counseled patient regarding potential benefits, risks, and side effects of Lamictal to include potential risk of Stevens-Johnson syndrome. Advised patient to stop taking Lamictal and contact office immediately if rash develops and to seek  urgent medical attention if rash is severe and/or spreading quickly.   There are no diagnoses linked to this encounter.   Please see After Visit Summary for patient specific  instructions.  No future appointments.  No orders of the defined types were placed in this encounter.     -------------------------------

## 2021-10-10 ENCOUNTER — Encounter: Payer: Self-pay | Admitting: Physical Therapy

## 2021-10-10 ENCOUNTER — Ambulatory Visit: Payer: Medicare Other | Attending: Orthopedic Surgery | Admitting: Physical Therapy

## 2021-10-10 DIAGNOSIS — R262 Difficulty in walking, not elsewhere classified: Secondary | ICD-10-CM | POA: Insufficient documentation

## 2021-10-10 DIAGNOSIS — R600 Localized edema: Secondary | ICD-10-CM | POA: Diagnosis not present

## 2021-10-10 DIAGNOSIS — M25661 Stiffness of right knee, not elsewhere classified: Secondary | ICD-10-CM | POA: Diagnosis not present

## 2021-10-10 DIAGNOSIS — M25561 Pain in right knee: Secondary | ICD-10-CM | POA: Diagnosis not present

## 2021-10-10 NOTE — Addendum Note (Signed)
Addended by: Sumner Boast on: 10/10/2021 02:09 PM   Modules accepted: Orders

## 2021-10-10 NOTE — Therapy (Addendum)
OUTPATIENT PHYSICAL THERAPY LOWER EXTREMITY EVALUATION   Patient Name: Arthur Moore MRN: [id number] DOB:[birth year], 71 y.o., male Today's Date: 10/10/2021   PT End of Session - 10/10/21 1307     Visit Number 1    Date for PT Re-Evaluation 01/10/22    Authorization Type Medicare    PT Start Time 1300    PT Stop Time 3825    PT Time Calculation (min) 55 min    Activity Tolerance Patient tolerated treatment well;Patient limited by pain    Behavior During Therapy [facility] for tasks assessed/performed             Past Medical History:  Diagnosis Date   Aortic insufficiency    Arthritis    Asthma    Carotid bruit 07/09/2021   Cervical disc disease    Depression    Diastolic dysfunction 0/53/9767   Hyperlipidemia 08/24/2015   Hypertension    Lumbar disc disease    Psoriasis    Past Surgical History:  Procedure Laterality Date   CHOLECYSTECTOMY  2012   HERNIA REPAIR     LEFT HEART CATH AND CORONARY ANGIOGRAPHY N/A 07/08/2018   Procedure: LEFT HEART CATH AND CORONARY ANGIOGRAPHY;  Surgeon: Burnell Blanks, MD;  Location: [facility];  Service: Cardiovascular;  Laterality: N/A;   PAROTIDECTOMY Right 1992   PROSTATE SURGERY  2002, 2004   [place]     SINUS EXPLORATION  2013   SPINE SURGERY  2013   L C7-T1 laminectomy and discectomy   TONSILLECTOMY     Patient Active Problem List   Diagnosis Date Noted   Carotid bruit 07/09/2021   Aneurysm of ascending aorta ([place]) 06/28/2020   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   CAD (coronary artery disease) 07/09/2018   CKD (chronic kidney disease), 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   Chest pain of uncertain etiology 34/19/3790   Chronic cough 06/21/2018   Thrombocytopenia ([place]) 12/01/2016   Transaminitis 24/12/7351   Diastolic dysfunction 29/92/4268   MDD (major depressive disorder), recurrent severe, without psychosis ([place]) 10/13/2015   Hyperlipidemia 08/24/2015   Arthritis    Depression     Hypertension    Aortic insufficiency    Psoriasis    Cervical disc disease    Lumbar disc disease    Asthma 01/06/2015   Psoriatic arthritis ([place]) 01/06/2015   Allergic rhinoconjunctivitis 01/06/2015   GERD (gastroesophageal reflux disease) 01/06/2015   Laryngopharyngeal reflux (LPR) 01/06/2015    PCP: Yaakov Guthrie  REFERRING PROVIDER: Victorino December  REFERRING DIAG: right medial meniscus tear  THERAPY DIAG:  Acute pain of right knee  Stiffness of right knee, not elsewhere classified  Difficulty in walking, not elsewhere classified  Localized edema  Rationale for Evaluation and Treatment Rehabilitation  ONSET DATE: December 2022  SUBJECTIVE:   SUBJECTIVE STATEMENT: Patient reports a right knee injury for a long period of time, reports playing pickleball recently has really caused a lot of pain and then really has to recover and has difficulty walking, difficulty with stairs.  MRI showed torn  meniscus.  Has had two injections but no relief  PERTINENT HISTORY: Asthma, depression, PTSD, HTN, CAD, CKD, AAA  PAIN:  Are you having pain? Yes: NPRS scale: 4/10 Pain location: mostly the lateral right knee and some posterior Pain description: sharp  Yes: NPRS scale: 4/10 Pain location: mostly the lateral right knee and some posterior Pain description: sharp  bending the knee, putting on shoes and socks, stairs, walking pain up to 9-10/10 Aggravating factors: bending,  walking, shoes and stairs Relieving factors: ice, rest, pain at best a 3-4/10  PRECAUTIONS: None  WEIGHT BEARING RESTRICTIONS No  FALLS:  Has patient fallen in last 6 months? No  LIVING ENVIRONMENT: Lives with: lives with their family Lives in: House/apartment Stairs: Yes: Internal: 16 steps; can reach both Has following equipment at home: None  OCCUPATION: retired  PLOF: Independent  pickleball a few times a week, also did the gym, does yardwork  PATIENT GOALS increase ROM, have no pain, better  motion, play pickleball   OBJECTIVE:   DIAGNOSTIC FINDINGS: MRI showed meniscus tear  COGNITION:  Overall cognitive status: Within functional limits for tasks assessed     SENSATION: WFL  EDEMA:  Mild laterally, no temperature changes  POSTURE: No Significant postural limitations  PALPATION: Tender patellar tendon, tender lateral joint lin  LOWER EXTREMITY ROM:  motions limited by pain  Active ROM Right eval Left eval  Hip flexion    Hip extension    Hip abduction    Hip adduction    Hip internal rotation    Hip external rotation    Knee flexion 100   Knee extension 10   Ankle dorsiflexion    Ankle plantarflexion    Ankle inversion    Ankle eversion     (Blank rows = not tested)  LOWER EXTREMITY MMT:  Limited by pain  MMT Right eval Left eval  Hip flexion    Hip extension    Hip abduction    Hip adduction    Hip internal rotation    Hip external rotation    Knee flexion 4-   Knee extension 4-   Ankle dorsiflexion    Ankle plantarflexion    Ankle inversion    Ankle eversion     (Blank rows = not tested)  LOWER EXTREMITY SPECIAL TESTS:  Deferred due to MRI  FUNCTIONAL TESTS:  30 seconds chair stand test  18 seconds  GAIT: Assistive device utilized: None Level of assistance: Complete Independence Comments: no device, very slow, very stiff legged gait, very antalgic on the right, stairs one at a time    TODAY'S TREATMENT: Vaso right knee medium pressure, 33 degrees F   PATIENT EDUCATION:  Education details: see below Person educated: Patient Education method: Consulting civil engineer, Media planner, Verbal cues, and Handouts Education comprehension: verbalized understanding   HOME EXERCISE PROGRAM: Access Code: [email or web address] URL: [email or web address] Date: 10/10/2021 Prepared by: Lum Babe  Exercises - Supine Short Arc Quad  - 2 x daily - 7 x weekly - 1 sets - 10 reps - 3 hold - Seated Knee Flexion AAROM  - 2 x daily - 7 x  weekly - 1 sets - 10 reps - 3 hold - Supine Ankle Pumps  - 2 x daily - 7 x weekly - 1 sets - 10 reps - 1 hold  ASSESSMENT:  CLINICAL IMPRESSION: Patient is a 71 y.o. male who was seen today for physical therapy evaluation and treatment for right knee pain and difficulty walking due to a meniscus tear.  .    OBJECTIVE IMPAIRMENTS Abnormal gait, decreased activity tolerance, decreased balance, decreased coordination, decreased endurance, decreased mobility, difficulty walking, decreased ROM, decreased strength, increased edema, increased muscle spasms, impaired flexibility, and pain.   ACTIVITY LIMITATIONS lifting, bending, standing, squatting, stairs, transfers, and locomotion level  REHAB POTENTIAL: Good  CLINICAL DECISION MAKING: Stable/uncomplicated  EVALUATION COMPLEXITY: Low  .hp GOALS: Goals reviewed with patient? Yes  SHORT TERM GOALS: Target date: 10/24/21 Independent with initial HEP Goal  status: INITIAL  LONG TERM GOALS: Target date: 01/02/22  Understand RICE Goal status: INITIAL  2.  Decrease pain overall 50% Goal status: INITIAL  3.  Increase AROM of the right knee to 0-120 degrees flexion Goal status: INITIAL  4.  Decresae TUG time tpo 11 seconds Goal status: INITIAL  5.  Go up and down stairs step over step Goal status: INITIAL   PLAN: PT FREQUENCY: 1-2x/week  PT DURATION: 12 weeks  PLANNED INTERVENTIONS: Therapeutic exercises, Therapeutic activity, Neuromuscular re-education, Balance training, Gait training, Patient/Family education, Joint manipulation, Joint mobilization, Dry Needling, Electrical stimulation, Cryotherapy, Moist heat, Taping, Vasopneumatic device, Ultrasound, and Manual therapy  PLAN FOR NEXT SESSION: slowly see if we can get him moving, could try DN and STM to the mms   Aleeza [place] W, PT 10/10/2021, 1:50 PM

## 2021-10-11 ENCOUNTER — Telehealth: Payer: Self-pay | Admitting: Adult Health

## 2021-10-11 DIAGNOSIS — F418 Other specified anxiety disorders: Secondary | ICD-10-CM

## 2021-10-11 DIAGNOSIS — F411 Generalized anxiety disorder: Secondary | ICD-10-CM

## 2021-10-11 DIAGNOSIS — F4310 Post-traumatic stress disorder, unspecified: Secondary | ICD-10-CM

## 2021-10-11 DIAGNOSIS — F39 Unspecified mood [affective] disorder: Secondary | ICD-10-CM

## 2021-10-11 MED ORDER — DESVENLAFAXINE SUCCINATE ER 50 MG PO TB24: 100.0000 mg | ORAL_TABLET | Freq: Every day | ORAL | 0 refills | Status: DC

## 2021-10-11 NOTE — Telephone Encounter (Signed)
Arthur Moore states that the Pristiq was sent to the wrong pharmacy. It was sent to [facility] but should have been sent to:  [facility] [id number] [place], [place] - [address]  Phone:  [phone]  Fax:  [phone]

## 2021-10-11 NOTE — Telephone Encounter (Signed)
Resent to HT.

## 2021-10-11 NOTE — Addendum Note (Signed)
Addended by: Bonney Leitz T on: 10/11/2021 01:40 PM   Modules accepted: Orders

## 2021-10-15 ENCOUNTER — Ambulatory Visit: Payer: Medicare Other

## 2021-10-15 DIAGNOSIS — R262 Difficulty in walking, not elsewhere classified: Secondary | ICD-10-CM | POA: Diagnosis not present

## 2021-10-15 DIAGNOSIS — M25561 Pain in right knee: Secondary | ICD-10-CM | POA: Diagnosis not present

## 2021-10-15 DIAGNOSIS — M25661 Stiffness of right knee, not elsewhere classified: Secondary | ICD-10-CM | POA: Diagnosis not present

## 2021-10-15 DIAGNOSIS — R600 Localized edema: Secondary | ICD-10-CM

## 2021-10-15 NOTE — Therapy (Signed)
OUTPATIENT PHYSICAL THERAPY LOWER EXTREMITY TREATMENT   Patient Name: Arthur Moore MRN: [id number] DOB:Dec 28, 1950, 71 y.o., male Today's Date: 10/15/2021   PT End of Session - 10/15/21 1718     Visit Number 2    Date for PT Re-Evaluation 01/10/22    Authorization Type Medicare    PT Start Time 1718    PT Stop Time 1759    PT Time Calculation (min) 41 min    Activity Tolerance Patient tolerated treatment well;Patient limited by pain    Behavior During Therapy [facility] for tasks assessed/performed              Past Medical History:  Diagnosis Date   Aortic insufficiency    Arthritis    Asthma    Carotid bruit 07/09/2021   Cervical disc disease    Depression    Diastolic dysfunction 08/04/8117   Hyperlipidemia 08/24/2015   Hypertension    Lumbar disc disease    Psoriasis    Past Surgical History:  Procedure Laterality Date   CHOLECYSTECTOMY  2012   HERNIA REPAIR     LEFT HEART CATH AND CORONARY ANGIOGRAPHY N/A 07/08/2018   Procedure: LEFT HEART CATH AND CORONARY ANGIOGRAPHY;  Surgeon: Burnell Blanks, MD;  Location: [place] [facility];  Service: Cardiovascular;  Laterality: N/A;   PAROTIDECTOMY Right 1992   PROSTATE SURGERY  2002, 2004   [place]     SINUS EXPLORATION  2013   SPINE SURGERY  2013   L C7-T1 laminectomy and discectomy   TONSILLECTOMY     Patient Active Problem List   Diagnosis Date Noted   Carotid bruit 07/09/2021   Aneurysm of ascending aorta ([place]) 06/28/2020   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   CAD (coronary artery disease) 07/09/2018   CKD (chronic kidney disease), 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   Chest pain of uncertain etiology 14/78/2956   Chronic cough 06/21/2018   Thrombocytopenia ([place]) 12/01/2016   Transaminitis 21/30/8657   Diastolic dysfunction 84/69/6295   MDD (major depressive disorder), recurrent severe, without psychosis ([place]) 10/13/2015   Hyperlipidemia 08/24/2015   Arthritis    Depression     Hypertension    Aortic insufficiency    Psoriasis    Cervical disc disease    Lumbar disc disease    Asthma 01/06/2015   Psoriatic arthritis ([place]) 01/06/2015   Allergic rhinoconjunctivitis 01/06/2015   GERD (gastroesophageal reflux disease) 01/06/2015   Laryngopharyngeal reflux (LPR) 01/06/2015    PCP: Yaakov Guthrie  REFERRING PROVIDER: Victorino December  REFERRING DIAG: right medial meniscus tear  THERAPY DIAG:  Acute pain of right knee  Localized edema  Stiffness of right knee, not elsewhere classified  Difficulty in walking, not elsewhere classified  Rationale for Evaluation and Treatment Rehabilitation  ONSET DATE: December 2022  SUBJECTIVE:   SUBJECTIVE STATEMENT: Patient reports 5/10 pain is doing exercises and noticed it is helping a little bit.   PERTINENT HISTORY: Asthma, depression, PTSD, HTN, CAD, CKD, AAA  PAIN:  Are you having pain? Yes: NPRS scale: 5/10 Pain location: mostly the lateral right knee and some posterior Pain description: sharp  Yes: NPRS scale: 4/10 Pain location: mostly the lateral right knee and some posterior Pain description: sharp  bending the knee, putting on shoes and socks, stairs, walking pain up to 9-10/10 Aggravating factors: bending, walking, shoes and stairs Relieving factors: ice, rest, pain at best a 3-4/10  PRECAUTIONS: None  WEIGHT BEARING RESTRICTIONS No  FALLS:  Has patient fallen in last 6 months? No  LIVING ENVIRONMENT: Lives with: lives with their family Lives in: House/apartment Stairs: Yes: Internal: 16 steps; can reach both Has following equipment at home: None  OCCUPATION: retired  PLOF: Independent  pickleball a few times a week, also did the gym, does yardwork  PATIENT GOALS increase ROM, have no pain, better motion, play pickleball   OBJECTIVE:   DIAGNOSTIC FINDINGS: MRI showed meniscus tear  COGNITION:  Overall cognitive status: Within functional limits for tasks  assessed     SENSATION: WFL  EDEMA:  Mild laterally, no temperature changes  POSTURE: No Significant postural limitations  PALPATION: Tender patellar tendon, tender lateral joint lin  LOWER EXTREMITY ROM:  motions limited by pain  Active ROM Right eval Left eval  Hip flexion    Hip extension    Hip abduction    Hip adduction    Hip internal rotation    Hip external rotation    Knee flexion 100   Knee extension 10   Ankle dorsiflexion    Ankle plantarflexion    Ankle inversion    Ankle eversion     (Blank rows = not tested)  LOWER EXTREMITY MMT:  Limited by pain  MMT Right eval Left eval  Hip flexion    Hip extension    Hip abduction    Hip adduction    Hip internal rotation    Hip external rotation    Knee flexion 4-   Knee extension 4-   Ankle dorsiflexion    Ankle plantarflexion    Ankle inversion    Ankle eversion     (Blank rows = not tested)  LOWER EXTREMITY SPECIAL TESTS:  Deferred due to MRI  FUNCTIONAL TESTS:  30 seconds chair stand test  18 seconds  GAIT: Assistive device utilized: None Level of assistance: Complete Independence Comments: no device, very slow, very stiff legged gait, very antalgic on the right, stairs one at a time    TODAY'S TREATMENT: Nustep L2, 75mns  AAROM knee flexion 5s holds x10  SAQ 2x10  LAQ 2# 2x10  Hamstring curls redTB 2x10 Alt cone taps 20reps  Vaso right knee medium pressure, 33 degrees F   PATIENT EDUCATION:  Education details: see below Person educated: Patient Education method: EConsulting civil engineer DMedia planner Verbal cues, and Handouts Education comprehension: verbalized understanding   HOME EXERCISE PROGRAM: Access Code: [email or web address] URL: [email or web address] Date: 10/10/2021 Prepared by: MLum Babe Exercises - Supine Short Arc Quad  - 2 x daily - 7 x weekly - 1 sets - 10 reps - 3 hold - Seated Knee Flexion AAROM  - 2 x daily - 7 x weekly - 1 sets - 10 reps - 3 hold -  Supine Ankle Pumps  - 2 x daily - 7 x weekly - 1 sets - 10 reps - 1 hold  ASSESSMENT:  CLINICAL IMPRESSION: Patient returns to PT with pain in R knee. He is able to tolerate all exercises today without an increase in pain. He demonstrates good quad activation with SAQ and LAQ. He has more difficulty with knee flexion due to pain. Ended with vaso to help with pain and edema around R knee.    OBJECTIVE IMPAIRMENTS Abnormal gait, decreased activity tolerance, decreased balance, decreased coordination, decreased endurance, decreased mobility, difficulty walking, decreased ROM, decreased strength, increased edema, increased muscle spasms, impaired flexibility, and pain.   ACTIVITY LIMITATIONS lifting, bending, standing, squatting, stairs, transfers, and locomotion level  REHAB POTENTIAL: Good  CLINICAL DECISION MAKING: Stable/uncomplicated  EVALUATION COMPLEXITY: Low  .hp GOALS:  Goals reviewed with patient? Yes  SHORT TERM GOALS: Target date: 10/24/21 Independent with initial HEP Goal status: INITIAL  LONG TERM GOALS: Target date: 01/02/22  Understand RICE Goal status: INITIAL  2.  Decrease pain overall 50% Goal status: INITIAL  3.  Increase AROM of the right knee to 0-120 degrees flexion Goal status: INITIAL  4.  Decresae TUG time tpo 11 seconds Goal status: INITIAL  5.  Go up and down stairs step over step Goal status: INITIAL   PLAN: PT FREQUENCY: 1-2x/week  PT DURATION: 12 weeks  PLANNED INTERVENTIONS: Therapeutic exercises, Therapeutic activity, Neuromuscular re-education, Balance training, Gait training, Patient/Family education, Joint manipulation, Joint mobilization, Dry Needling, Electrical stimulation, Cryotherapy, Moist heat, Taping, Vasopneumatic device, Ultrasound, and Manual therapy  PLAN FOR NEXT SESSION: slowly see if we can get him moving, could try DN and STM to the [place], PT 10/15/2021, 6:06 PM

## 2021-10-17 ENCOUNTER — Other Ambulatory Visit: Payer: Self-pay | Admitting: Adult Health

## 2021-10-17 DIAGNOSIS — F418 Other specified anxiety disorders: Secondary | ICD-10-CM

## 2021-10-17 DIAGNOSIS — F411 Generalized anxiety disorder: Secondary | ICD-10-CM

## 2021-10-17 DIAGNOSIS — F39 Unspecified mood [affective] disorder: Secondary | ICD-10-CM

## 2021-10-17 DIAGNOSIS — F4310 Post-traumatic stress disorder, unspecified: Secondary | ICD-10-CM

## 2021-10-21 ENCOUNTER — Encounter: Payer: Self-pay | Admitting: Physical Therapy

## 2021-10-21 ENCOUNTER — Ambulatory Visit: Payer: Medicare Other | Admitting: Physical Therapy

## 2021-10-21 DIAGNOSIS — M25561 Pain in right knee: Secondary | ICD-10-CM

## 2021-10-21 DIAGNOSIS — R262 Difficulty in walking, not elsewhere classified: Secondary | ICD-10-CM | POA: Diagnosis not present

## 2021-10-21 DIAGNOSIS — M25661 Stiffness of right knee, not elsewhere classified: Secondary | ICD-10-CM

## 2021-10-21 DIAGNOSIS — R600 Localized edema: Secondary | ICD-10-CM

## 2021-10-23 ENCOUNTER — Ambulatory Visit (INDEPENDENT_AMBULATORY_CARE_PROVIDER_SITE_OTHER): Payer: Medicare Other

## 2021-10-23 DIAGNOSIS — J309 Allergic rhinitis, unspecified: Secondary | ICD-10-CM | POA: Diagnosis not present

## 2021-10-24 DIAGNOSIS — L821 Other seborrheic keratosis: Secondary | ICD-10-CM | POA: Diagnosis not present

## 2021-10-24 DIAGNOSIS — L578 Other skin changes due to chronic exposure to nonionizing radiation: Secondary | ICD-10-CM | POA: Diagnosis not present

## 2021-10-24 DIAGNOSIS — Z85828 Personal history of other malignant neoplasm of skin: Secondary | ICD-10-CM | POA: Diagnosis not present

## 2021-10-24 DIAGNOSIS — D225 Melanocytic nevi of trunk: Secondary | ICD-10-CM | POA: Diagnosis not present

## 2021-10-24 DIAGNOSIS — D1801 Hemangioma of skin and subcutaneous tissue: Secondary | ICD-10-CM | POA: Diagnosis not present

## 2021-10-24 DIAGNOSIS — L218 Other seborrheic dermatitis: Secondary | ICD-10-CM | POA: Diagnosis not present

## 2021-10-24 DIAGNOSIS — L905 Scar conditions and fibrosis of skin: Secondary | ICD-10-CM | POA: Diagnosis not present

## 2021-10-24 DIAGNOSIS — B078 Other viral warts: Secondary | ICD-10-CM | POA: Diagnosis not present

## 2021-10-30 ENCOUNTER — Ambulatory Visit: Payer: Medicare Other | Attending: Orthopedic Surgery

## 2021-10-30 DIAGNOSIS — M25661 Stiffness of right knee, not elsewhere classified: Secondary | ICD-10-CM

## 2021-10-30 DIAGNOSIS — R600 Localized edema: Secondary | ICD-10-CM

## 2021-10-30 DIAGNOSIS — M25561 Pain in right knee: Secondary | ICD-10-CM

## 2021-10-30 DIAGNOSIS — R262 Difficulty in walking, not elsewhere classified: Secondary | ICD-10-CM

## 2021-10-30 NOTE — Therapy (Signed)
OUTPATIENT PHYSICAL THERAPY LOWER EXTREMITY TREATMENT   Patient Name: Arthur Moore MRN: [id number] DOB:[birth year], 71 y.o., male Today's Date: 10/30/2021   PT End of Session - 10/30/21 1447     Visit Number 4    Date for PT Re-Evaluation 01/10/22    Authorization Type Medicare    PT Start Time 1445    PT Stop Time 1448    PT Time Calculation (min) 3 min    Activity Tolerance Patient tolerated treatment well;Patient limited by pain    Behavior During Therapy [facility] for tasks assessed/performed                Past Medical History:  Diagnosis Date   Aortic insufficiency    Arthritis    Asthma    Carotid bruit 07/09/2021   Cervical disc disease    Depression    Diastolic dysfunction 6/80/3212   Hyperlipidemia 08/24/2015   Hypertension    Lumbar disc disease    Psoriasis    Past Surgical History:  Procedure Laterality Date   CHOLECYSTECTOMY  2012   HERNIA REPAIR     LEFT HEART CATH AND CORONARY ANGIOGRAPHY N/A 07/08/2018   Procedure: LEFT HEART CATH AND CORONARY ANGIOGRAPHY;  Surgeon: Burnell Blanks, MD;  Location: [place] [facility];  Service: Cardiovascular;  Laterality: N/A;   PAROTIDECTOMY Right 1992   PROSTATE SURGERY  2002, 2004   [place]     SINUS EXPLORATION  2013   SPINE SURGERY  2013   L C7-T1 laminectomy and discectomy   TONSILLECTOMY     Patient Active Problem List   Diagnosis Date Noted   Carotid bruit 07/09/2021   Aneurysm of ascending aorta ([place]) 06/28/2020   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   CAD (coronary artery disease) 07/09/2018   CKD (chronic kidney disease), 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   Chest pain of uncertain etiology 24/82/5003   Chronic cough 06/21/2018   Thrombocytopenia ([place]) 12/01/2016   Transaminitis 70/48/8891   Diastolic dysfunction 69/45/0388   MDD (major depressive disorder), recurrent severe, without psychosis ([place]) 10/13/2015   Hyperlipidemia 08/24/2015   Arthritis    Depression     Hypertension    Aortic insufficiency    Psoriasis    Cervical disc disease    Lumbar disc disease    Asthma 01/06/2015   Psoriatic arthritis ([place]) 01/06/2015   Allergic rhinoconjunctivitis 01/06/2015   GERD (gastroesophageal reflux disease) 01/06/2015   Laryngopharyngeal reflux (LPR) 01/06/2015    PCP: Yaakov Guthrie  REFERRING PROVIDER: Victorino December  REFERRING DIAG: right medial meniscus tear  THERAPY DIAG:  Acute pain of right knee  Difficulty in walking, not elsewhere classified  Localized edema  Stiffness of right knee, not elsewhere classified  Rationale for Evaluation and Treatment Rehabilitation  ONSET DATE: December 2022  SUBJECTIVE:   SUBJECTIVE STATEMENT: Patient states he started a new medication and it is making him dizzy and very nauseous he states "I don't think I will be able do my appointment today."  PERTINENT HISTORY: Asthma, depression, PTSD, HTN, CAD, CKD, AAA  PAIN:  Are you having pain? Yes: NPRS scale: 7/10 Pain location: mostly the medial/anterior right knee  Pain description: sharp  bending the knee, putting on shoes and socks, stairs, walking pain up to 9-10/10 Aggravating factors: bending, walking, shoes and stairs Relieving factors: ice, rest, pain at best a 3-4/10  PRECAUTIONS: None  WEIGHT BEARING RESTRICTIONS No  FALLS:  Has patient fallen in last 6 months? No  LIVING ENVIRONMENT: Lives with: lives  with their family Lives in: House/apartment Stairs: Yes: Internal: 16 steps; can reach both Has following equipment at home: None  OCCUPATION: retired  PLOF: Independent  pickleball a few times a week, also did the gym, does yardwork  PATIENT GOALS increase ROM, have no pain, better motion, play pickleball   OBJECTIVE:   DIAGNOSTIC FINDINGS: MRI showed meniscus tear  COGNITION:  Overall cognitive status: Within functional limits for tasks assessed     SENSATION: WFL  EDEMA:  Mild laterally, no temperature  changes  POSTURE: No Significant postural limitations  PALPATION: Tender patellar tendon, tender lateral joint lin  LOWER EXTREMITY ROM:  motions limited by pain  Active ROM Right eval Left eval  Hip flexion    Hip extension    Hip abduction    Hip adduction    Hip internal rotation    Hip external rotation    Knee flexion 100   Knee extension 10   Ankle dorsiflexion    Ankle plantarflexion    Ankle inversion    Ankle eversion     (Blank rows = not tested)  LOWER EXTREMITY MMT:  Limited by pain  MMT Right eval Left eval  Hip flexion    Hip extension    Hip abduction    Hip adduction    Hip internal rotation    Hip external rotation    Knee flexion 4-   Knee extension 4-   Ankle dorsiflexion    Ankle plantarflexion    Ankle inversion    Ankle eversion     (Blank rows = not tested)  LOWER EXTREMITY SPECIAL TESTS:  Deferred due to MRI  FUNCTIONAL TESTS:  30 seconds chair stand test  18 seconds  GAIT: Assistive device utilized: None Level of assistance: Complete Independence Comments: no device, very slow, very stiff legged gait, very antalgic on the right, stairs one at a time    TODAY'S TREATMENT:  10/21/21 Deferred Nu-Step due to pain all exercises done with RLE SLR 2 x 10- no weight due to pain level. SLR with ER, 8 reps, then stopped due to pain. Sidelying hip abduction-8 reps, C/O hip pain.  Nustep L2, 29mns  AAROM knee flexion 5s holds x10  SAQ 2x10  LAQ 2# 2x10  Hamstring curls redTB 2x10 Alt cone taps 20reps  Vaso right knee medium pressure, 33 degrees F   PATIENT EDUCATION:  Education details: see below Person educated: Patient Education method: EConsulting civil engineer DMedia planner Verbal cues, and Handouts Education comprehension: verbalized understanding   HOME EXERCISE PROGRAM: Access Code: [email or web address] URL: [email or web address] Date: 10/10/2021 Prepared by: MLum Babe Exercises - Supine Short Arc Quad  - 2 x  daily - 7 x weekly - 1 sets - 10 reps - 3 hold - Seated Knee Flexion AAROM  - 2 x daily - 7 x weekly - 1 sets - 10 reps - 3 hold - Supine Ankle Pumps  - 2 x daily - 7 x weekly - 1 sets - 10 reps - 1 hold  ASSESSMENT:  CLINICAL IMPRESSION: Patient not doing well due to side effects of a new medication, he was unable to carry on with PT visit today. No charges for the visit and we rescheduled.    OBJECTIVE IMPAIRMENTS Abnormal gait, decreased activity tolerance, decreased balance, decreased coordination, decreased endurance, decreased mobility, difficulty walking, decreased ROM, decreased strength, increased edema, increased muscle spasms, impaired flexibility, and pain.   ACTIVITY LIMITATIONS lifting, bending, standing, squatting, stairs, transfers, and locomotion level  REHAB POTENTIAL: Good  CLINICAL DECISION MAKING: Stable/uncomplicated  EVALUATION COMPLEXITY: Low  .hp GOALS: Goals reviewed with patient? Yes  SHORT TERM GOALS: Target date: 10/24/21 Independent with initial HEP Goal status: Patient given HEP, has had difficulty completing it.   LONG TERM GOALS: Target date: 01/02/22  Understand RICE Goal status: INITIAL  2.  Decrease pain overall 50% Goal status: INITIAL  3.  Increase AROM of the right knee to 0-120 degrees flexion Goal status: INITIAL  4.  Decresae TUG time tpo 11 seconds Goal status: INITIAL  5.  Go up and down stairs step over step Goal status: INITIAL   PLAN: PT FREQUENCY: 1-2x/week  PT DURATION: 12 weeks  PLANNED INTERVENTIONS: Therapeutic exercises, Therapeutic activity, Neuromuscular re-education, Balance training, Gait training, Patient/Family education, Joint manipulation, Joint mobilization, Dry Needling, Electrical stimulation, Cryotherapy, Moist heat, Taping, Vasopneumatic device, Ultrasound, and Manual therapy  PLAN FOR NEXT SESSION: slowly see if we can get him moving, could try DN and STM to the mms   Marcelina Morel, DPT 10/30/2021,  2:53 PM

## 2021-10-31 ENCOUNTER — Telehealth: Payer: Self-pay | Admitting: Adult Health

## 2021-10-31 DIAGNOSIS — J3089 Other allergic rhinitis: Secondary | ICD-10-CM | POA: Diagnosis not present

## 2021-10-31 NOTE — Telephone Encounter (Signed)
Pt informed

## 2021-10-31 NOTE — Telephone Encounter (Signed)
Arthur Moore called today at 1:30 to report that since starting to wean off his medication he is feeling very dizzy, nauseous, not doing well at all.  He wants to discuss another plan or get some help to get through the withdrawal.  Has appt 7/11 but would like a call now to discuss options.

## 2021-10-31 NOTE — Telephone Encounter (Signed)
Have him leave the Wellbutrin off for now. It should not be causing those side effects.

## 2021-10-31 NOTE — Telephone Encounter (Signed)
Pt was present but I ended up speaking to his wife because he felt too bad to talk.He weaned off pristiq and started the Wellbutrin.It has been 3 days and he keeps vomiting and is dizzy.They want to know what can be done to help with this.I recommended zofran for nausea and said I will check with you to see what else can be done because he stopped Wellbutrin and refuses to continue taking it.

## 2021-10-31 NOTE — Progress Notes (Signed)
VIALS EXP 11-01-22 

## 2021-11-01 DIAGNOSIS — J302 Other seasonal allergic rhinitis: Secondary | ICD-10-CM | POA: Diagnosis not present

## 2021-11-04 ENCOUNTER — Ambulatory Visit: Payer: Medicare Other

## 2021-11-04 DIAGNOSIS — R600 Localized edema: Secondary | ICD-10-CM

## 2021-11-04 DIAGNOSIS — R262 Difficulty in walking, not elsewhere classified: Secondary | ICD-10-CM

## 2021-11-04 DIAGNOSIS — S83241D Other tear of medial meniscus, current injury, right knee, subsequent encounter: Secondary | ICD-10-CM | POA: Diagnosis not present

## 2021-11-04 DIAGNOSIS — M25661 Stiffness of right knee, not elsewhere classified: Secondary | ICD-10-CM

## 2021-11-04 DIAGNOSIS — J3081 Allergic rhinitis due to animal (cat) (dog) hair and dander: Secondary | ICD-10-CM | POA: Diagnosis not present

## 2021-11-04 DIAGNOSIS — M7631 Iliotibial band syndrome, right leg: Secondary | ICD-10-CM | POA: Diagnosis not present

## 2021-11-04 DIAGNOSIS — M25561 Pain in right knee: Secondary | ICD-10-CM | POA: Diagnosis not present

## 2021-11-04 NOTE — Therapy (Signed)
OUTPATIENT PHYSICAL THERAPY LOWER EXTREMITY TREATMENT   Patient Name: Arthur Moore MRN: [id number] DOB:1950-10-15, 71 y.o., male Today's Date: 11/04/2021   PT End of Session - 11/04/21 1313     Visit Number 5    Date for PT Re-Evaluation 01/10/22    Authorization Type Medicare    PT Start Time 1315    PT Stop Time 8502    PT Time Calculation (min) 43 min    Activity Tolerance Patient tolerated treatment well;Patient limited by pain    Behavior During Therapy [facility] for tasks assessed/performed                 Past Medical History:  Diagnosis Date   Aortic insufficiency    Arthritis    Asthma    Carotid bruit 07/09/2021   Cervical disc disease    Depression    Diastolic dysfunction 7/74/1287   Hyperlipidemia 08/24/2015   Hypertension    Lumbar disc disease    Psoriasis    Past Surgical History:  Procedure Laterality Date   CHOLECYSTECTOMY  2012   HERNIA REPAIR     LEFT HEART CATH AND CORONARY ANGIOGRAPHY N/A 07/08/2018   Procedure: LEFT HEART CATH AND CORONARY ANGIOGRAPHY;  Surgeon: Burnell Blanks, MD;  Location: [facility];  Service: Cardiovascular;  Laterality: N/A;   PAROTIDECTOMY Right 1992   PROSTATE SURGERY  2002, 2004   [place]     SINUS EXPLORATION  2013   SPINE SURGERY  2013   L C7-T1 laminectomy and discectomy   TONSILLECTOMY     Patient Active Problem List   Diagnosis Date Noted   Carotid bruit 07/09/2021   Aneurysm of ascending aorta ([place]) 06/28/2020   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   CAD (coronary artery disease) 07/09/2018   CKD (chronic kidney disease), 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   Chest pain of uncertain etiology 86/76/7209   Chronic cough 06/21/2018   Thrombocytopenia ([place]) 12/01/2016   Transaminitis 47/12/6281   Diastolic dysfunction 66/29/4765   MDD (major depressive disorder), recurrent severe, without psychosis ([place]) 10/13/2015   Hyperlipidemia 08/24/2015   Arthritis    Depression     Hypertension    Aortic insufficiency    Psoriasis    Cervical disc disease    Lumbar disc disease    Asthma 01/06/2015   Psoriatic arthritis ([place]) 01/06/2015   Allergic rhinoconjunctivitis 01/06/2015   GERD (gastroesophageal reflux disease) 01/06/2015   Laryngopharyngeal reflux (LPR) 01/06/2015    PCP: Yaakov Guthrie  REFERRING PROVIDER: Victorino December  REFERRING DIAG: right medial meniscus tear  THERAPY DIAG:  Acute pain of right knee  Difficulty in walking, not elsewhere classified  Localized edema  Stiffness of right knee, not elsewhere classified  Rationale for Evaluation and Treatment Rehabilitation  ONSET DATE: December 2022  SUBJECTIVE:   SUBJECTIVE STATEMENT: Patient reports pain is a 6/10, will see ortho at 3pm today and would like to not do any strenuous activity beforehand.   PERTINENT HISTORY: Asthma, depression, PTSD, HTN, CAD, CKD, AAA  PAIN:  Are you having pain? Yes: NPRS scale: 7/10 Pain location: mostly the medial/anterior right knee  Pain description: sharp  bending the knee, putting on shoes and socks, stairs, walking pain up to 9-10/10 Aggravating factors: bending, walking, shoes and stairs Relieving factors: ice, rest, pain at best a 3-4/10  PRECAUTIONS: None  WEIGHT BEARING RESTRICTIONS No  FALLS:  Has patient fallen in last 6 months? No  LIVING ENVIRONMENT: Lives with: lives with their family Lives in:  House/apartment Stairs: Yes: Internal: 16 steps; can reach both Has following equipment at home: None  OCCUPATION: retired  PLOF: Independent  pickleball a few times a week, also did the gym, does yardwork  PATIENT GOALS increase ROM, have no pain, better motion, play pickleball   OBJECTIVE:   DIAGNOSTIC FINDINGS: MRI showed meniscus tear  COGNITION:  Overall cognitive status: Within functional limits for tasks assessed     SENSATION: WFL  EDEMA:  Mild laterally, no temperature changes  POSTURE: No Significant  postural limitations  PALPATION: Tender patellar tendon, tender lateral joint lin  LOWER EXTREMITY ROM:  motions limited by pain  Active ROM Right eval Left eval  Hip flexion    Hip extension    Hip abduction    Hip adduction    Hip internal rotation    Hip external rotation    Knee flexion 100   Knee extension 10   Ankle dorsiflexion    Ankle plantarflexion    Ankle inversion    Ankle eversion     (Blank rows = not tested)  LOWER EXTREMITY MMT:  Limited by pain  MMT Right eval Left eval  Hip flexion    Hip extension    Hip abduction    Hip adduction    Hip internal rotation    Hip external rotation    Knee flexion 4-   Knee extension 4-   Ankle dorsiflexion    Ankle plantarflexion    Ankle inversion    Ankle eversion     (Blank rows = not tested)  LOWER EXTREMITY SPECIAL TESTS:  Deferred due to MRI  FUNCTIONAL TESTS:  30 seconds chair stand test  18 seconds  GAIT: Assistive device utilized: None Level of assistance: Complete Independence Comments: no device, very slow, very stiff legged gait, very antalgic on the right, stairs one at a time    TODAY'S TREATMENT: 11/04/21 Nustep L2 x53mn LAQ 2# 2x10 HS curls with greenTb 2x10 Step ups 6" 20reps  STS 2x10  SLR 2x10 Bridges 2x10 HS stretch 30s  Vaso 38F, med, 144ms   10/21/21 Deferred Nu-Step due to pain all exercises done with RLE SLR 2 x 10- no weight due to pain level. SLR with ER, 8 reps, then stopped due to pain. Sidelying hip abduction-8 reps, C/O hip pain.  Nustep L2, 26m73m  AAROM knee flexion 5s holds x10  SAQ 2x10  LAQ 2# 2x10  Hamstring curls redTB 2x10 Alt cone taps 20reps  Vaso right knee medium pressure, 33 degrees F   PATIENT EDUCATION:  Education details: see below Person educated: Patient Education method: ExpConsulting civil engineeremMedia plannererbal cues, and Handouts Education comprehension: verbalized understanding   HOME EXERCISE PROGRAM: Access Code: [email or web address] URL:  [email or web address] Date: 10/10/2021 Prepared by: MicLum Babexercises - Supine Short Arc Quad  - 2 x daily - 7 x weekly - 1 sets - 10 reps - 3 hold - Seated Knee Flexion AAROM  - 2 x daily - 7 x weekly - 1 sets - 10 reps - 3 hold - Supine Ankle Pumps  - 2 x daily - 7 x weekly - 1 sets - 10 reps - 1 hold  ASSESSMENT:  CLINICAL IMPRESSION: Patient returns with continued knee pain and requests no strenuous activity due to having to see orthopedic in the afternoon. We worked on general LE strengthening, he was able to tolerate session well but complains of pain w/ STS in the second set but able to complete exercises. Ended with vaso to  help with pain and edema in R knee.   OBJECTIVE IMPAIRMENTS Abnormal gait, decreased activity tolerance, decreased balance, decreased coordination, decreased endurance, decreased mobility, difficulty walking, decreased ROM, decreased strength, increased edema, increased muscle spasms, impaired flexibility, and pain.   ACTIVITY LIMITATIONS lifting, bending, standing, squatting, stairs, transfers, and locomotion level  REHAB POTENTIAL: Good  CLINICAL DECISION MAKING: Stable/uncomplicated  EVALUATION COMPLEXITY: Low  .hp GOALS: Goals reviewed with patient? Yes  SHORT TERM GOALS: Target date: 10/24/21 Independent with initial HEP Goal status: Patient given HEP, has had difficulty completing it.   LONG TERM GOALS: Target date: 01/02/22  Understand RICE Goal status: INITIAL  2.  Decrease pain overall 50% Goal status: INITIAL  3.  Increase AROM of the right knee to 0-120 degrees flexion Goal status: INITIAL  4.  Decresae TUG time tpo 11 seconds Goal status: INITIAL  5.  Go up and down stairs step over step Goal status: INITIAL   PLAN: PT FREQUENCY: 1-2x/week  PT DURATION: 12 weeks  PLANNED INTERVENTIONS: Therapeutic exercises, Therapeutic activity, Neuromuscular re-education, Balance training, Gait training,  Patient/Family education, Joint manipulation, Joint mobilization, Dry Needling, Electrical stimulation, Cryotherapy, Moist heat, Taping, Vasopneumatic device, Ultrasound, and Manual therapy  PLAN FOR NEXT SESSION: slowly see if we can get him moving, could try DN and STM to the mms   Marcelina Morel, DPT 11/04/2021, 2:03 PM

## 2021-11-05 ENCOUNTER — Ambulatory Visit (INDEPENDENT_AMBULATORY_CARE_PROVIDER_SITE_OTHER): Payer: Medicare Other | Admitting: Adult Health

## 2021-11-05 ENCOUNTER — Encounter: Payer: Self-pay | Admitting: Adult Health

## 2021-11-05 DIAGNOSIS — F39 Unspecified mood [affective] disorder: Secondary | ICD-10-CM | POA: Diagnosis not present

## 2021-11-05 DIAGNOSIS — F411 Generalized anxiety disorder: Secondary | ICD-10-CM | POA: Diagnosis not present

## 2021-11-05 DIAGNOSIS — F4310 Post-traumatic stress disorder, unspecified: Secondary | ICD-10-CM

## 2021-11-05 DIAGNOSIS — F331 Major depressive disorder, recurrent, moderate: Secondary | ICD-10-CM

## 2021-11-05 MED ORDER — DESVENLAFAXINE SUCCINATE ER 25 MG PO TB24: 25.0000 mg | ORAL_TABLET | Freq: Every day | ORAL | 0 refills | Status: DC

## 2021-11-05 NOTE — Progress Notes (Signed)
Arthur Moore [id number] December 25, 1950 71 y.o.  Subjective:   Patient ID:  Arthur Moore is a 71 y.o. (DOB 13-Dec-1950) male.  Chief Complaint: No chief complaint on file.   HPI Arthur Moore presents to the office today for follow-up of Episodic Mood Disorder, PTSD, depression and situational anxiety.  Accompanied by wife.  Describes mood today as "ok". Pleasant. Decreased tearfulness. Denies depression, anxiety and irritability. Denies recent panic attacks. Mood is consistent. Stating "I feel frustrated because I don't feel good". Has tapered off the  Pristiq and is experiencing untoward side effects - nausea, vomiting x 4, and brain zaps. Tapered off the Pristiq as directed while starting the Wellbutrin. Has been off the Pristiq for almost 2 weeks. Did not tolerate the Wellbutrin and stopped it. Reports saliva has increased from the sides of his mouth and would like to stop the Vraylar. Also reporting a 15 pound weight gain over past few months related to inactivity and possibly medications. Working with therapist Cornelia Copa. Varying interest and motivation. Taking medications as prescribed.  Energy levels stable. Active, unable to exercise with leg injury. Enjoys some usual interests and activities. Married. Lives with wife - 4 children between them. Spending time with family. Appetite stable. Weight gain - 190 pounds. Sleeps well at night. Averages 10 to 12 hours.  Focus and concentration improved. Completing tasks. Managing aspects of household. Retired - [place] veteran. Denies SI or HI.  Denies AH or VH.  Previous medication trials: Lexapro, Cymbalta, Xanax, Lamictal, Wellbutrin, Effexor, Paxil, Abilify, Pristiq, Vraylar,    PHQ2-9    Flowsheet Row Nutrition [facility] 06/11/2017 [facility]  PHQ-2 Total Score 0      Flowsheet Row ED from 04/14/2021 in [facility] from 12/07/2020 in [facility] from 11/08/2020 in [place] No Risk No Risk Error: Question 6 not populated        Review of Systems:  Review of Systems  Musculoskeletal:  Negative for gait problem.  Neurological:  Negative for tremors.  Psychiatric/Behavioral:         Please refer to HPI    Medications: I have reviewed the patient's current medications.  Current Outpatient Medications  Medication Sig Dispense Refill   desvenlafaxine (PRISTIQ) 25 MG 24 hr tablet Take 1 tablet (25 mg total) by mouth daily. 30 tablet 0   albuterol (VENTOLIN HFA) 108 (90 Base) MCG/ACT inhaler INHALE 2 PUFFS INTO THE LUNGS EVERY 4 (FOUR) HOURS AS NEEDED FOR WHEEZING OR SHORTNESS OF BREATH. 6.7 each 1   aspirin EC 81 MG tablet Take 81 mg by mouth daily.     budesonide (RHINOCORT AQUA) 32 MCG/ACT nasal spray Use two sprays in each nostril once daily 8.43 mL 5   budesonide-formoterol (SYMBICORT) 160-4.5 MCG/ACT inhaler Inhale 2 puffs into the lungs 2 (two) times daily.     busPIRone (BUSPAR) 15 MG tablet Take 1 tablet (15 mg total) by mouth 3 (three) times daily. 270 tablet 3   Cetirizine HCl (ZYRTEC ALLERGY PO) Take 1 tablet by mouth daily.     Cholecalciferol (VITAMIN D3) 5000 units TABS Take 5,000 Units by mouth daily.      lamoTRIgine (LAMICTAL) 200 MG tablet Take 1 tablet (200 mg total) by mouth 2 (two) times daily. 180 tablet 3   Magnesium Oxide (MAG-OXIDE) 200 MG TABS Take 2.5 tablets (500 mg total) by mouth daily. (Patient taking differently: Take 2 tablets  by mouth daily.)  0   metoprolol tartrate (LOPRESSOR) 25 MG tablet TAKE 1/2 TABLET BY MOUTH TWICE A DAY 90 tablet 3   Misc Natural Products (OSTEO BI-FLEX ADV JOINT SHIELD PO) Take 1 tablet by mouth daily.     Multiple Vitamin (MULTIVITAMIN) capsule Take 1 capsule by mouth daily.     nitroGLYCERIN (NITROSTAT) 0.4 MG SL tablet Place 1 tablet (0.4 mg total) under the tongue every 5 (five) minutes as  needed for chest pain. 25 tablet 3   pantoprazole (PROTONIX) 40 MG tablet Take 1 tablet (40 mg total) by mouth daily. 90 tablet 1   Probiotic Product (PROBIOTIC DAILY PO) Take 1 tablet by mouth daily.     rosuvastatin (CRESTOR) 10 MG tablet Take 1 tablet by mouth daily.     vitamin C (ASCORBIC ACID) 250 MG tablet Take 250 mg by mouth daily.     zinc gluconate 50 MG tablet Take 1 tablet (50 mg total) by mouth daily.     No current facility-administered medications for this visit.    Medication Side Effects: None  Allergies:  Allergies  Allergen Reactions   Atorvastatin Other (See Comments)    Calf pain. Other reaction(s): leb numbness   Zolpidem Other (See Comments)    Other reaction(s): Drowsy    Past Medical History:  Diagnosis Date   Aortic insufficiency    Arthritis    Asthma    Carotid bruit 07/09/2021   Cervical disc disease    Depression    Diastolic dysfunction 0/25/8527   Hyperlipidemia 08/24/2015   Hypertension    Lumbar disc disease    Psoriasis     Past Medical History, Surgical history, Social history, and Family history were reviewed and updated as appropriate.   Please see review of systems for further details on the patient's review from today.   Objective:   Physical Exam:  There were no vitals taken for this visit.  Physical Exam Constitutional:      General: He is not in acute distress. Musculoskeletal:        General: No deformity.  Neurological:     Mental Status: He is alert and oriented to person, place, and time.     Coordination: Coordination normal.  Psychiatric:        Attention and Perception: Attention and perception normal. He does not perceive auditory or visual hallucinations.        Mood and Affect: Mood normal. Mood is not anxious or depressed. Affect is not labile, blunt, angry or inappropriate.        Speech: Speech normal.        Behavior: Behavior normal.        Thought Content: Thought content normal. Thought content is  not paranoid or delusional. Thought content does not include homicidal or suicidal ideation. Thought content does not include homicidal or suicidal plan.        Cognition and Memory: Cognition and memory normal.        Judgment: Judgment normal.     Comments: Insight intact     Lab Review:     Component Value Date/Time   NA 138 11/08/2020 1350   NA 144 10/18/2020 1130   K 3.9 11/08/2020 1350   CL 106 11/08/2020 1350   CO2 24 11/08/2020 1350   GLUCOSE 87 11/08/2020 1350   BUN 24 (H) 11/08/2020 1350   BUN 20 10/18/2020 1130   CREATININE 1.39 (H) 11/08/2020 1350   CALCIUM 9.6 11/08/2020 1350  PROT 6.6 10/18/2020 1130   ALBUMIN 4.7 10/18/2020 1130   AST 20 10/18/2020 1130   ALT 21 10/18/2020 1130   ALKPHOS 89 10/18/2020 1130   BILITOT 0.7 10/18/2020 1130   GFRNONAA 55 (L) 11/08/2020 1350   GFRAA >60 01/01/2020 0324       Component Value Date/Time   WBC 6.9 11/08/2020 1350   RBC 5.19 11/08/2020 1350   HGB 15.0 11/08/2020 1350   HGB 16.7 12/09/2016 1537   HCT 43.5 11/08/2020 1350   HCT 46.9 12/09/2016 1537   PLT 145 (L) 11/08/2020 1350   PLT 141 12/09/2016 1537   MCV 83.8 11/08/2020 1350   MCV 87.3 12/09/2016 1537   MCH 28.9 11/08/2020 1350   MCHC 34.5 11/08/2020 1350   RDW 12.9 11/08/2020 1350   RDW 13.6 12/09/2016 1537   LYMPHSABS 2.3 11/08/2020 1350   LYMPHSABS 1.5 12/09/2016 1537   MONOABS 0.6 11/08/2020 1350   MONOABS 0.4 12/09/2016 1537   EOSABS 0.4 11/08/2020 1350   EOSABS 0.3 12/09/2016 1537   BASOSABS 0.0 11/08/2020 1350   BASOSABS 0.0 12/09/2016 1537    No results found for: "POCLITH", "LITHIUM"   No results found for: "PHENYTOIN", "PHENOBARB", "VALPROATE", "CBMZ"   .res Assessment: Plan:    Plan:  PDMP reviewed  Lamictal '200mg'$  twice daily Buspar '15mg'$  daily   D/C Vraylar 1.'5mg'$  daily - side effects - excess saliva  D/C Pristiq '100mg'$  to '50mg'$  every morning x 2 weeks, then D/C D/C Wellbutrin XL '150mg'$  every morning - denies seizure history -  has taken previously  Seeing therapist.  Plans to follow up with PCP for symptoms as well  RTC 4 weeks  Patient advised to contact office with any questions, adverse effects, or acute worsening in signs and symptoms.  Counseled patient regarding potential benefits, risks, and side effects of Lamictal to include potential risk of Stevens-Johnson syndrome. Advised patient to stop taking Lamictal and contact office immediately if rash develops and to seek urgent medical attention if rash is severe and/or spreading quickly.    Diagnoses and all orders for this visit:  Major depressive disorder, recurrent episode, moderate (HCC) -     desvenlafaxine (PRISTIQ) 25 MG 24 hr tablet; Take 1 tablet (25 mg total) by mouth daily.  Episodic mood disorder (HCC)  GAD (generalized anxiety disorder)  Posttraumatic stress disorder with delayed expression     Please see After Visit Summary for patient specific instructions.  No future appointments.   No orders of the defined types were placed in this encounter.   -------------------------------

## 2021-11-06 ENCOUNTER — Telehealth (HOSPITAL_BASED_OUTPATIENT_CLINIC_OR_DEPARTMENT_OTHER): Payer: Self-pay | Admitting: *Deleted

## 2021-11-06 NOTE — Telephone Encounter (Signed)
Primary [place], MD   Preoperative team, please contact this patient and set up a phone call appointment for further preoperative risk assessment. Please obtain consent and complete medication review. Thank you for your help.   I confirm that guidance regarding antiplatelet and oral anticoagulation therapy has been completed and, if necessary, noted below (none requested).  Question regarding lab work prior to knee surgery should be directed to [facility].  Emmaline Life, NP-C    11/06/2021, 12:23 PM [place] [address] Office [phone] Fax [phone]

## 2021-11-06 NOTE — Telephone Encounter (Signed)
Patient is following up regarding knee surgery. He would like to know if lab work is recommended prior to procedure. Please advise.

## 2021-11-06 NOTE — Telephone Encounter (Signed)
   Pre-operative Risk Assessment    Patient Name: Arthur Moore  DOB: 02/24/1951 MRN: [id number]      Request for Surgical Clearance    Procedure:   Right Knee scope with medial meniscus repair  Date of Surgery:  Clearance TBD                                 Surgeon:  Dr Victorino December Surgeon's Group or Practice Name:  [facility] Phone number:  [phone] Fax number:  [phone]   Type of Clearance Requested:   - Medical    Type of Anesthesia:   Choice   Additional requests/questions:    Rocco Pauls   11/06/2021, 9:41 AM

## 2021-11-08 ENCOUNTER — Telehealth: Payer: Self-pay | Admitting: *Deleted

## 2021-11-08 NOTE — Telephone Encounter (Signed)
1st attempt to reach pt regarding surgical clearance and the need for a tele visit.  Left a message for pt to call back and ask for the preop team. 

## 2021-11-08 NOTE — Telephone Encounter (Signed)
Pt has been scheduled for a tele visit, 11/15/21 2:20, consent on file / medications reconciled.    Patient Consent for Virtual Visit        Lakshya Mcgillicuddy has provided verbal consent on 11/08/2021 for a virtual visit (video or telephone).   CONSENT FOR VIRTUAL VISIT FOR:  [name]  By participating in this virtual visit I agree to the following:  I hereby voluntarily request, consent and authorize [facility] and its employed or contracted physicians, physician assistants, nurse practitioners or other licensed health care professionals (the Practitioner), to provide me with telemedicine health care services (the "Services") as deemed necessary by the treating Practitioner. I acknowledge and consent to receive the Services by the Practitioner via telemedicine. I understand that the telemedicine visit will involve communicating with the Practitioner through live audiovisual communication technology and the disclosure of certain medical information by electronic transmission. I acknowledge that I have been given the opportunity to request an in-person assessment or other available alternative prior to the telemedicine visit and am voluntarily participating in the telemedicine visit.  I understand that I have the right to withhold or withdraw my consent to the use of telemedicine in the course of my care at any time, without affecting my right to future care or treatment, and that the Practitioner or I may terminate the telemedicine visit at any time. I understand that I have the right to inspect all information obtained and/or recorded in the course of the telemedicine visit and may receive copies of available information for a reasonable fee.  I understand that some of the potential risks of receiving the Services via telemedicine include:  Delay or interruption in medical evaluation due to technological equipment failure or disruption; Information transmitted may not be sufficient  (e.g. poor resolution of images) to allow for appropriate medical decision making by the Practitioner; and/or  In rare instances, security protocols could fail, causing a breach of personal health information.  Furthermore, I acknowledge that it is my responsibility to provide information about my medical history, conditions and care that is complete and accurate to the best of my ability. I acknowledge that Practitioner's advice, recommendations, and/or decision may be based on factors not within their control, such as incomplete or inaccurate data provided by me or distortions of diagnostic images or specimens that may result from electronic transmissions. I understand that the practice of medicine is not an exact science and that Practitioner makes no warranties or guarantees regarding treatment outcomes. I acknowledge that a copy of this consent can be made available to me via my patient portal ([facility]), or I can request a printed copy by calling the office of [facility].    I understand that my insurance will be billed for this visit.   I have read or had this consent read to me. I understand the contents of this consent, which adequately explains the benefits and risks of the Services being provided via telemedicine.  I have been provided ample opportunity to ask questions regarding this consent and the Services and have had my questions answered to my satisfaction. I give my informed consent for the services to be provided through the use of telemedicine in my medical care   3

## 2021-11-08 NOTE — Telephone Encounter (Signed)
  Transferred to [employer]

## 2021-11-08 NOTE — Telephone Encounter (Signed)
Pt has been scheduled for a tele visit, 11/15/21.

## 2021-11-09 ENCOUNTER — Other Ambulatory Visit: Payer: Self-pay | Admitting: Adult Health

## 2021-11-09 DIAGNOSIS — F331 Major depressive disorder, recurrent, moderate: Secondary | ICD-10-CM

## 2021-11-11 ENCOUNTER — Telehealth: Payer: Self-pay | Admitting: Adult Health

## 2021-11-11 NOTE — Telephone Encounter (Signed)
Called patient. He had no complaints at this time. He said you helped him get thru a tough time and he just wanted to let you know he was doing much better.

## 2021-11-11 NOTE — Telephone Encounter (Signed)
Pt LVM at 1:21p.  He said [name] told him to call back and let her know how he was doing.  He said he is dong much better than the last time he saw her.  No upcoming appt scheduled.

## 2021-11-11 NOTE — Telephone Encounter (Signed)
Noted. Ty!

## 2021-11-12 ENCOUNTER — Encounter: Payer: Self-pay | Admitting: Allergy and Immunology

## 2021-11-12 ENCOUNTER — Ambulatory Visit (INDEPENDENT_AMBULATORY_CARE_PROVIDER_SITE_OTHER): Payer: Medicare Other

## 2021-11-12 ENCOUNTER — Telehealth: Payer: Self-pay | Admitting: Cardiovascular Disease

## 2021-11-12 ENCOUNTER — Other Ambulatory Visit: Payer: Self-pay

## 2021-11-12 ENCOUNTER — Encounter (HOSPITAL_BASED_OUTPATIENT_CLINIC_OR_DEPARTMENT_OTHER): Payer: Self-pay | Admitting: Emergency Medicine

## 2021-11-12 ENCOUNTER — Emergency Department (HOSPITAL_BASED_OUTPATIENT_CLINIC_OR_DEPARTMENT_OTHER): Payer: Medicare Other

## 2021-11-12 ENCOUNTER — Ambulatory Visit (INDEPENDENT_AMBULATORY_CARE_PROVIDER_SITE_OTHER): Payer: Medicare Other | Admitting: Allergy and Immunology

## 2021-11-12 ENCOUNTER — Emergency Department (HOSPITAL_BASED_OUTPATIENT_CLINIC_OR_DEPARTMENT_OTHER)
Admission: EM | Admit: 2021-11-12 | Discharge: 2021-11-13 | Disposition: A | Discharge status: Home / Self Care | Payer: Medicare Other | Attending: Emergency Medicine | Admitting: Emergency Medicine

## 2021-11-12 VITALS — BP 122/70 | HR 65 | Temp 98.1°F | Resp 16 | Ht 70.87 in | Wt 193.4 lb

## 2021-11-12 DIAGNOSIS — L299 Pruritus, unspecified: Secondary | ICD-10-CM

## 2021-11-12 DIAGNOSIS — R490 Dysphonia: Secondary | ICD-10-CM | POA: Diagnosis not present

## 2021-11-12 DIAGNOSIS — J3089 Other allergic rhinitis: Secondary | ICD-10-CM | POA: Diagnosis not present

## 2021-11-12 DIAGNOSIS — J4540 Moderate persistent asthma, uncomplicated: Secondary | ICD-10-CM | POA: Diagnosis not present

## 2021-11-12 DIAGNOSIS — Z7951 Long term (current) use of inhaled steroids: Secondary | ICD-10-CM | POA: Insufficient documentation

## 2021-11-12 DIAGNOSIS — K219 Gastro-esophageal reflux disease without esophagitis: Secondary | ICD-10-CM | POA: Diagnosis not present

## 2021-11-12 DIAGNOSIS — R0789 Other chest pain: Secondary | ICD-10-CM | POA: Diagnosis not present

## 2021-11-12 DIAGNOSIS — R0602 Shortness of breath: Secondary | ICD-10-CM | POA: Diagnosis not present

## 2021-11-12 DIAGNOSIS — I2510 Atherosclerotic heart disease of native coronary artery without angina pectoris: Secondary | ICD-10-CM | POA: Diagnosis not present

## 2021-11-12 DIAGNOSIS — N182 Chronic kidney disease, stage 2 (mild): Secondary | ICD-10-CM | POA: Diagnosis not present

## 2021-11-12 DIAGNOSIS — Z79899 Other long term (current) drug therapy: Secondary | ICD-10-CM | POA: Insufficient documentation

## 2021-11-12 DIAGNOSIS — J45909 Unspecified asthma, uncomplicated: Secondary | ICD-10-CM | POA: Diagnosis not present

## 2021-11-12 DIAGNOSIS — R079 Chest pain, unspecified: Secondary | ICD-10-CM | POA: Diagnosis not present

## 2021-11-12 DIAGNOSIS — I129 Hypertensive chronic kidney disease with stage 1 through stage 4 chronic kidney disease, or unspecified chronic kidney disease: Secondary | ICD-10-CM | POA: Diagnosis not present

## 2021-11-12 DIAGNOSIS — Z7982 Long term (current) use of aspirin: Secondary | ICD-10-CM | POA: Diagnosis not present

## 2021-11-12 DIAGNOSIS — R059 Cough, unspecified: Secondary | ICD-10-CM | POA: Insufficient documentation

## 2021-11-12 DIAGNOSIS — J309 Allergic rhinitis, unspecified: Secondary | ICD-10-CM

## 2021-11-12 LAB — BASIC METABOLIC PANEL
Anion gap: 8 (ref 5–15)
BUN: 26 mg/dL — ABNORMAL HIGH (ref 8–23)
CO2: 22 mmol/L (ref 22–32)
Calcium: 8.9 mg/dL (ref 8.9–10.3)
Chloride: 109 mmol/L (ref 98–111)
Creatinine, Ser: 1.36 mg/dL — ABNORMAL HIGH (ref 0.61–1.24)
GFR, Estimated: 56 mL/min — ABNORMAL LOW (ref >60)
Glucose, Bld: 167 mg/dL — ABNORMAL HIGH (ref 70–99)
Potassium: 3.8 mmol/L (ref 3.5–5.1)
Sodium: 139 mmol/L (ref 135–145)

## 2021-11-12 LAB — CBC
HCT: 42.5 % (ref 39.0–52.0)
Hemoglobin: 15.1 g/dL (ref 13.0–17.0)
MCH: 31 pg (ref 26.0–34.0)
MCHC: 35.5 g/dL (ref 30.0–36.0)
MCV: 87.3 fL (ref 80.0–100.0)
Platelets: 135 10*3/uL — ABNORMAL LOW (ref 150–400)
RBC: 4.87 MIL/uL (ref 4.22–5.81)
RDW: 12.7 % (ref 11.5–15.5)
WBC: 9.5 10*3/uL (ref 4.0–10.5)
nRBC: 0.2 % (ref 0.0–0.2)

## 2021-11-12 LAB — D-DIMER, QUANTITATIVE: D-Dimer, Quant: 0.33 ug/mL-FEU (ref 0.00–0.50)

## 2021-11-12 LAB — TROPONIN I (HIGH SENSITIVITY): Troponin I (High Sensitivity): 5 ng/L (ref <18)

## 2021-11-12 MED ORDER — BUDESONIDE 32 MCG/ACT NA SUSP: NASAL | 5 refills | Status: DC

## 2021-11-12 MED ORDER — ALBUTEROL SULFATE HFA 108 (90 BASE) MCG/ACT IN AERS: 2.0000 | INHALATION_SPRAY | RESPIRATORY_TRACT | 1 refills | Status: DC | PRN

## 2021-11-12 MED ORDER — CETIRIZINE HCL 10 MG PO TABS: 10.0000 mg | ORAL_TABLET | Freq: Every day | ORAL | 5 refills | Status: DC | PRN

## 2021-11-12 NOTE — ED Triage Notes (Signed)
Cp & SOB X 1-2 weeks worse over last 2 days. Hx of cardiac.

## 2021-11-12 NOTE — Patient Instructions (Addendum)
  1.  Change Symbicort to [employer] - 2 inhalations 2 times a day  2. Continue Flonase 1-2 sprays each nostril one time per day   3. Continue Protonix 40 mg 2 times per day  4. Continue the following if needed:   A. Zyrtec 10 mg - 1-2 tabs 1-2 times per day (MAX='40mg'$ /day)  B. Albuterol HFA  C. Dermatology prescribed topical agent  5.  Contact cardiologist about shortness of breath  6.  Evaluation of throat with ENT for hoarseness  6. Return to clinic in 4 weeks or earlier if problem  7. Obtain fall flu vaccine and RSV vaccine

## 2021-11-12 NOTE — Telephone Encounter (Signed)
Spoke with patient regarding shortness of breath  Stated he has had productive cough with clear/white sputum Denies any swelling or chest pain  Allergist did not do chest xray  Scheduled him for in office visit Friday with Overton Mam NP

## 2021-11-12 NOTE — Telephone Encounter (Signed)
Pt c/o Shortness Of Breath: STAT if SOB developed within the last 24 hours or pt is noticeably SOB on the phone  1. Are you currently SOB (can you hear that pt is SOB on the phone)? Yes  2. How long have you been experiencing SOB? It started a month or two ago  3. Are you SOB when sitting or when up moving around? Both   4. Are you currently experiencing any other symptoms? Was told by his allergist doctor that he may need to have an in person visit for his pre-op clearance. He states that he was put on prednisone a month ago and it hasn't helped, so his allergist recommends he be seen in person.

## 2021-11-12 NOTE — Progress Notes (Signed)
[facility] [place] [facility] [place]   Follow-up Note  Referring Provider: No ref. provider found Primary Provider: Vernie Shanks, MD (Inactive) Date of Office Visit: 11/12/2021  Subjective:   Arthur Moore (DOB: June 13, 1950) is a 71 y.o. male who returns to the [facility] [place] on 11/12/2021 in re-evaluation of the following:  HPI: [name] returns to this clinic in evaluation of asthma, allergic rhinoconjunctivitis, LPR, and pruritic disorder involving forearms.  His last visit with me was 12 February 2021.  He has not required a systemic steroid or an antibiotic for any type of airway issue but he has been having some problems with his respiratory tract over the course of the past 2 months.  He complains of having shortness of breath.  Sometimes he feels a little short of breath when he is speaking especially at the end of a sentence.  Sometimes he feels short of breath when he is eating.  Sometimes he just feels short of breath moving around or sitting.  In addition, he had some slight cough and he has had some phlegm in his chest that goes up to his throat and has been a little bit raspy.  When he uses a short acting bronchodilator it does not appear to affect this issue.  He continues to have these breathing issues even though he has recently been treated with prednisone and a steroid shot in his left knee.  He is finishing his prednisone taper in 2 days.  He believes that his reflux is under pretty good control at this point in time.  He had very little issues with his nose.  His pruritic issue involving his forearms is under excellent control with some of the clobetasol use on an infrequent basis that was given to him by dermatology.  Allergies as of 11/12/2021       Reactions   Atorvastatin Other (See Comments)   Calf pain. Other reaction(s): leb numbness   Zolpidem Other (See Comments)   Other reaction(s): Drowsy        Medication List     albuterol 108 (90 Base) MCG/ACT inhaler Commonly known as: VENTOLIN HFA INHALE 2 PUFFS INTO THE LUNGS EVERY 4 (FOUR) HOURS AS NEEDED FOR WHEEZING OR SHORTNESS OF BREATH.   aspirin EC 81 MG tablet Take 81 mg by mouth daily.   budesonide 32 MCG/ACT nasal spray Commonly known as: RHINOCORT AQUA Use two sprays in each nostril once daily   budesonide-formoterol 160-4.5 MCG/ACT inhaler Commonly known as: SYMBICORT Inhale 2 puffs into the lungs 2 (two) times daily.   busPIRone 15 MG tablet Commonly known as: BUSPAR Take 1 tablet (15 mg total) by mouth 3 (three) times daily. What changed: when to take this   lamoTRIgine 200 MG tablet Commonly known as: LAMICTAL Take 1 tablet (200 mg total) by mouth 2 (two) times daily.   metoprolol tartrate 25 MG tablet Commonly known as: LOPRESSOR TAKE 1/2 TABLET BY MOUTH TWICE A DAY   multivitamin capsule Take 1 capsule by mouth daily.   nitroGLYCERIN 0.4 MG SL tablet Commonly known as: NITROSTAT Place 1 tablet (0.4 mg total) under the tongue every 5 (five) minutes as needed for chest pain.   pantoprazole 40 MG tablet Commonly known as: PROTONIX Take 1 tablet (40 mg total) by mouth daily.   PROBIOTIC DAILY PO Take 1 tablet by mouth daily.   rosuvastatin 10 MG tablet Commonly known as: CRESTOR Take 1 tablet by mouth daily.  Vitamin D3 125 MCG (5000 UT) Tabs Take 5,000 Units by mouth daily.   zinc gluconate 50 MG tablet Take 1 tablet (50 mg total) by mouth daily.   ZYRTEC ALLERGY PO Take 1 tablet by mouth daily.    Past Medical History:  Diagnosis Date  . Aortic insufficiency   . Arthritis   . Asthma   . Carotid bruit 07/09/2021  . Cervical disc disease   . Depression   . Diastolic dysfunction 0/12/3816  . Hyperlipidemia 08/24/2015  . Hypertension   . Lumbar disc disease   . Psoriasis     Past Surgical History:  Procedure Laterality Date  . CHOLECYSTECTOMY  2012  . HERNIA REPAIR    . LEFT HEART CATH AND CORONARY  ANGIOGRAPHY N/A 07/08/2018   Procedure: LEFT HEART CATH AND CORONARY ANGIOGRAPHY;  Surgeon: Burnell Blanks, MD;  Location: [facility];  Service: Cardiovascular;  Laterality: N/A;  . PAROTIDECTOMY Right 1992  . PROSTATE SURGERY  2002, 2004  . SINOSCOPY    . SINUS EXPLORATION  2013  . SPINE SURGERY  2013   L C7-T1 laminectomy and discectomy  . TONSILLECTOMY      Review of systems negative except as noted in HPI / PMHx or noted below:  Review of Systems  Constitutional: Negative.   HENT: Negative.    Eyes: Negative.   Respiratory: Negative.    Cardiovascular: Negative.   Gastrointestinal: Negative.   Genitourinary: Negative.   Musculoskeletal: Negative.   Skin: Negative.   Neurological: Negative.   Endo/Heme/Allergies: Negative.   Psychiatric/Behavioral: Negative.       Objective:   Vitals:   11/12/21 1119  BP: 122/70  Pulse: 65  Resp: 16  Temp: 98.1 F (36.7 C)  SpO2: 95%   Height: 5' 10.87" (180 cm)  Weight: 193 lb 6.4 oz (87.7 kg)   Physical Exam Constitutional:      Appearance: He is not diaphoretic.  HENT:     Head: Normocephalic.     Right Ear: Tympanic membrane, ear canal and external ear normal.     Left Ear: Tympanic membrane, ear canal and external ear normal.     Nose: Nose normal. No mucosal edema or rhinorrhea.     Mouth/Throat:     Pharynx: Uvula midline. No oropharyngeal exudate.  Eyes:     Conjunctiva/sclera: Conjunctivae normal.  Neck:     Thyroid: No thyromegaly.     Trachea: Trachea normal. No tracheal tenderness or tracheal deviation.  Cardiovascular:     Rate and Rhythm: Normal rate and regular rhythm.     Heart sounds: Normal heart sounds, S1 normal and S2 normal. No murmur heard. Pulmonary:     Effort: No respiratory distress.     Breath sounds: Normal breath sounds. No stridor. No wheezing or rales.  Lymphadenopathy:     Head:     Right side of head: No tonsillar adenopathy.     Left side of head: No tonsillar  adenopathy.     Cervical: No cervical adenopathy.  Skin:    Findings: No erythema or rash.     Nails: There is no clubbing.  Neurological:     Mental Status: He is alert.    Diagnostics:    Spirometry was performed and demonstrated an FEV1 of 2.27 at 71 % of predicted.  Results of a CT coronary morphology study obtained 15 May 2017 identified the following:  1. Coronary artery calcium score 361 Agatston units, placing the patient in the 78th  percentile for age and gender, suggesting intermediate to high risk for future cardiac events.   2. Extensive calcified plaque in the proximal LAD, possible moderate stenosis though blooming artifact may make the disease look worse than it actually is. I will send for FFR.  Results of a cardiac catheterization obtained 08 July 2018 identified the following:  Dist RCA lesion is 20% stenosed. Prox Cx lesion is 10% stenosed. Dist LM to Ost LAD lesion is 20% stenosed. Ost 1st Diag lesion is 50% stenosed. The left ventricular systolic function is normal. LV end diastolic pressure is normal. The left ventricular ejection fraction is 50-55% by visual estimate. There is no mitral valve regurgitation. Prox LAD lesion is 20% stenosed.  Results of a chest x-ray obtained 01 January 2020 identified the following:  Heart and mediastinal contours are within normal limits. No focal opacities or effusions. No acute bony abnormality. Aortic atherosclerosis.    Assessment and Plan:   1. Not well controlled moderate persistent asthma   2. Other allergic rhinitis   3. LPRD (laryngopharyngeal reflux disease)   4. Pruritic disorder   5. Shortness of breath     Patient Instructions   1.  Change Symbicort to [employer] - 2 inhalations 2 times a day  2. Continue Flonase 1-2 sprays each nostril one time per day   3. Continue Protonix 40 mg 2 times per day  4. Continue the following if needed:   A. Zyrtec 10 mg - 1-2 tabs 1-2 times per day  (MAX='40mg'$ /day)  B. Albuterol HFA  C. Dermatology prescribed topical agent  5.  Contact cardiologist about shortness of breath  6.  Evaluation of throat with ENT for hoarseness  6. Return to clinic in 4 weeks or earlier if problem  7. Obtain fall flu vaccine and RSV vaccine       Allena Katz, MD Allergy / Immunology [place]

## 2021-11-12 NOTE — ED Provider Notes (Signed)
[facility] Provider Note  CSN: [id number] Arrival date & time: 11/12/21 2010  Chief Complaint(s) Chest Pain and Shortness of Breath  HPI Arthur Moore is a 71 y.o. male {Add pertinent medical, surgical, social history, OB history to HPI:1}   The history is provided by the patient.  Chest Pain Pain location:  L chest Pain quality: aching   Onset quality:  Sudden Duration: onset 2 days ago, lasting 1-2 seconds. Timing:  Intermittent Progression:  Waxing and waning Chronicity:  New Context comment:  Sporadic Relieved by:  Nothing Worsened by:  Nothing Associated symptoms: cough and shortness of breath   Shortness of Breath Severity:  Moderate Onset quality:  Gradual Duration:  2 weeks Timing:  Constant Progression:  Worsening Chronicity:  New Relieved by:  Nothing Exacerbated by: exertion, speaking, bending. Even at rest. Associated symptoms: chest pain and cough   Associated symptoms: no wheezing   Risk factors: no hx of PE/DVT     Past Medical History Past Medical History:  Diagnosis Date   Aortic insufficiency    Arthritis    Asthma    Carotid bruit 07/09/2021   Cervical disc disease    Depression    Diastolic dysfunction 04/30/7251   Hyperlipidemia 08/24/2015   Hypertension    Lumbar disc disease    Psoriasis    Patient Active Problem List   Diagnosis Date Noted   Carotid bruit 07/09/2021   Aneurysm of ascending aorta ([place]) 06/28/2020   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   CAD (coronary artery disease) 07/09/2018   CKD (chronic kidney disease), 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   Chest pain of uncertain etiology 66/44/0347   Chronic cough 06/21/2018   Thrombocytopenia ([place]) 12/01/2016   Transaminitis 42/59/5638   Diastolic dysfunction 75/64/3329   MDD (major depressive disorder), recurrent severe, without psychosis ([place]) 10/13/2015   Hyperlipidemia 08/24/2015   Arthritis    Depression     Hypertension    Aortic insufficiency    Psoriasis    Cervical disc disease    Lumbar disc disease    Asthma 01/06/2015   Psoriatic arthritis ([place]) 01/06/2015   Allergic rhinoconjunctivitis 01/06/2015   GERD (gastroesophageal reflux disease) 01/06/2015   Laryngopharyngeal reflux (LPR) 01/06/2015   Home Medication(s) Prior to Admission medications   Medication Sig Start Date End Date Taking? Authorizing Provider  albuterol (VENTOLIN HFA) 108 (90 Base) MCG/ACT inhaler Inhale 2 puffs into the lungs every 4 (four) hours as needed for wheezing or shortness of breath. 11/12/21   Kozlow, Donnamarie Poag, MD  aspirin EC 81 MG tablet Take 81 mg by mouth daily.    [provider]  budesonide (RHINOCORT AQUA) 32 MCG/ACT nasal spray Use two sprays in each nostril once daily 11/12/21   Kozlow, Donnamarie Poag, MD  budesonide-formoterol (SYMBICORT) 160-4.5 MCG/ACT inhaler Inhale 2 puffs into the lungs 2 (two) times daily.    [provider]  busPIRone (BUSPAR) 15 MG tablet Take 1 tablet (15 mg total) by mouth 3 (three) times daily. Patient taking differently: Take 15 mg by mouth daily. 08/27/21   Mozingo, Berdie Ogren, NP  cetirizine (ZYRTEC ALLERGY) 10 MG tablet Take 1 tablet (10 mg total) by mouth daily as needed (can take an ectra dose during flare ups). 11/12/21   Kozlow, Donnamarie Poag, MD  Cholecalciferol (VITAMIN D3) 5000 units TABS Take 5,000 Units by mouth daily.     [provider]  lamoTRIgine (LAMICTAL) 200 MG tablet Take 1 tablet (200 mg total) by mouth  2 (two) times daily. 06/06/21   Mozingo, Berdie Ogren, NP  metoprolol tartrate (LOPRESSOR) 25 MG tablet TAKE 1/2 TABLET BY MOUTH TWICE A DAY 01/31/21   Skeet Latch, MD  Multiple Vitamin (MULTIVITAMIN) capsule Take 1 capsule by mouth daily.    [provider]  nitroGLYCERIN (NITROSTAT) 0.4 MG SL tablet Place 1 tablet (0.4 mg total) under the tongue every 5 (five) minutes as needed for chest pain. 05/13/19 11/08/21  Deberah Pelton,  NP  pantoprazole (PROTONIX) 40 MG tablet Take 1 tablet (40 mg total) by mouth daily. 02/12/21   Kozlow, Donnamarie Poag, MD  Probiotic Product (PROBIOTIC DAILY PO) Take 1 tablet by mouth daily.    [provider]  rosuvastatin (CRESTOR) 10 MG tablet Take 1 tablet by mouth daily.    [provider]  zinc gluconate 50 MG tablet Take 1 tablet (50 mg total) by mouth daily. 07/26/18   Lendon Colonel, NP                                                                                                                                    Allergies Atorvastatin and Zolpidem  Review of Systems Review of Systems  Respiratory:  Positive for cough and shortness of breath. Negative for wheezing.   Cardiovascular:  Positive for chest pain.   As noted in HPI  Physical Exam Vital Signs  I have reviewed the triage vital signs BP 124/76   Pulse (!) 56   Temp 98.7 F (37.1 C)   Resp 17   Ht 5' 11.5" (1.816 m)   Wt 86.2 kg   SpO2 95%   BMI 26.13 kg/m  *** Physical Exam Vitals reviewed.  Constitutional:      General: He is not in acute distress.    Appearance: He is well-developed. He is not diaphoretic.  HENT:     Head: Normocephalic and atraumatic.     Nose: Nose normal.  Eyes:     General: No scleral icterus.       Right eye: No discharge.        Left eye: No discharge.     Conjunctiva/sclera: Conjunctivae normal.     Pupils: Pupils are equal, round, and reactive to light.  Cardiovascular:     Rate and Rhythm: Normal rate and regular rhythm.     Heart sounds: No murmur heard.    No friction rub. No gallop.  Pulmonary:     Effort: Pulmonary effort is normal. No respiratory distress.     Breath sounds: Normal breath sounds. No stridor. No rales.  Abdominal:     General: There is no distension.     Palpations: Abdomen is soft.     Tenderness: There is no abdominal tenderness.  Musculoskeletal:        General: No tenderness.     Cervical back: Normal range of motion and  neck supple.     Right  lower leg: No edema.     Left lower leg: No edema.  Skin:    General: Skin is warm and dry.     Findings: No erythema or rash.  Neurological:     Mental Status: He is alert and oriented to person, place, and time.     ED Results and Treatments Labs (all labs ordered are listed, but only abnormal results are displayed) Labs Reviewed  BASIC METABOLIC PANEL - Abnormal; Notable for the following components:      Result Value   Glucose, Bld 167 (*)    BUN 26 (*)    Creatinine, Ser 1.36 (*)    GFR, Estimated 56 (*)    All other components within normal limits  CBC - Abnormal; Notable for the following components:   Platelets 135 (*)    All other components within normal limits  D-DIMER, QUANTITATIVE  BRAIN NATRIURETIC PEPTIDE  TROPONIN I (HIGH SENSITIVITY)  TROPONIN I (HIGH SENSITIVITY)                                                                                                                         EKG  EKG Interpretation  Date/Time:  Tuesday November 12 2021 20:18:33 EDT Ventricular Rate:  67 PR Interval:  154 QRS Duration: 94 QT Interval:  404 QTC Calculation: 426 R Axis:   36 Text Interpretation: Normal sinus rhythm Normal ECG When compared with ECG of 05-Feb-2020 20:18, PREVIOUS ECG IS PRESENT No significant change was found Confirmed by Addison Lank [id number]) on 11/12/2021 11:07:11 PM       Radiology DG Chest 2 View  Result Date: 11/12/2021 CLINICAL DATA:  Chest pain short of breath EXAM: CHEST - 2 VIEW COMPARISON:  01/01/2020 FINDINGS: The heart size and mediastinal contours are within normal limits. Aortic atherosclerosis. Both lungs are clear. The visualized skeletal structures are unremarkable. IMPRESSION: No active cardiopulmonary disease. Electronically Signed   By: Donavan Foil M.D.   On: 11/12/2021 20:36    Pertinent labs & imaging results that were available during my care of the patient were reviewed by me and considered in my  medical decision making (see MDM for details).  Medications Ordered in ED Medications - No data to display  Procedures Procedures  (including critical care time)  Medical Decision Making / ED Course    Complexity of Problem:  Co-morbidities/SDOH that complicate the patient evaluation/care: ***  Additional history obtained: ***  Patient's presenting problem/concern, DDX, and MDM listed below: ***  Hospitalization Considered:  ***  Initial Intervention:  ***    Complexity of Data:   Cardiac Monitoring: ***  Laboratory Tests ordered listed below with my independent interpretation: ***   Imaging Studies ordered listed below with my independent interpretation: ***     ED Course:    Assessment, Add'l Intervention, and Reassessment: ***  ***  Final Clinical Impression(s) / ED Diagnoses Final diagnoses:  None    {Document critical care time when appropriate:1}  {Document review of labs and clinical decision tools ie heart score, Chads2Vasc2 etc:1}  {Document your independent review of radiology images, and any outside records:1} {Document your discussion with family members, caretakers, and with consultants:1} {Document social determinants of health affecting pt's care:1} {Document your decision making why or why not admission, treatments were needed:1} This chart was dictated using voice recognition software.  Despite best efforts to proofread,  errors can occur which can change the documentation meaning.

## 2021-11-12 NOTE — ED Notes (Signed)
ED Provider at bedside. 

## 2021-11-13 ENCOUNTER — Encounter: Payer: Self-pay | Admitting: Allergy and Immunology

## 2021-11-13 LAB — BRAIN NATRIURETIC PEPTIDE: B Natriuretic Peptide: 31.8 pg/mL (ref 0.0–100.0)

## 2021-11-13 LAB — TROPONIN I (HIGH SENSITIVITY): Troponin I (High Sensitivity): 5 ng/L (ref <18)

## 2021-11-13 NOTE — ED Notes (Signed)
IV removed, Catheter intact, pressure dressing applied. Pt tolerated well

## 2021-11-15 ENCOUNTER — Encounter (HOSPITAL_BASED_OUTPATIENT_CLINIC_OR_DEPARTMENT_OTHER): Payer: Self-pay | Admitting: Family

## 2021-11-15 ENCOUNTER — Ambulatory Visit (INDEPENDENT_AMBULATORY_CARE_PROVIDER_SITE_OTHER): Payer: Medicare Other | Admitting: Family

## 2021-11-15 ENCOUNTER — Telehealth: Payer: Medicare Other

## 2021-11-15 VITALS — BP 118/70 | HR 65 | Ht 71.5 in | Wt 192.7 lb

## 2021-11-15 DIAGNOSIS — E785 Hyperlipidemia, unspecified: Secondary | ICD-10-CM

## 2021-11-15 DIAGNOSIS — I10 Essential (primary) hypertension: Secondary | ICD-10-CM | POA: Diagnosis not present

## 2021-11-15 DIAGNOSIS — Z01810 Encounter for preprocedural cardiovascular examination: Secondary | ICD-10-CM

## 2021-11-15 DIAGNOSIS — I25118 Atherosclerotic heart disease of native coronary artery with other forms of angina pectoris: Secondary | ICD-10-CM | POA: Diagnosis not present

## 2021-11-15 NOTE — Patient Instructions (Signed)
Medication Instructions:  Continue your current medications.   You may hold Aspirin prior to surgery if deemed necessary by surgeon.   *If you need a refill on your cardiac medications before your next appointment, please call your pharmacy*   Lab Work: None ordered today.   Testing/Procedures: None ordered today.   Follow-Up: At [facility], you and your health needs are our priority.  As part of our continuing mission to provide you with exceptional heart care, we have created designated Provider Care Teams.  These Care Teams include your primary Cardiologist (physician) and Advanced Practice Providers (APPs -  Physician Assistants and Nurse Practitioners) who all work together to provide you with the care you need, when you need it.  We recommend signing up for the patient portal called "MyChart".  Sign up information is provided on this After Visit Summary.  MyChart is used to connect with patients for Virtual Visits (Telemedicine).  Patients are able to view lab/test results, encounter notes, upcoming appointments, etc.  Non-urgent messages can be sent to your provider as well.   To learn more about what you can do with MyChart, go to [email or web address].    Your next appointment:   06/2022 with Dr. Oval Linsey   Other Instructions  Loel Dubonnet, NPl will send clearance to orthopedics for your surgery.   Heart Healthy Diet Recommendations: A low-salt diet is recommended. Meats should be grilled, baked, or boiled. Avoid fried foods. Focus on lean protein sources like fish or chicken with vegetables and fruits. The American Heart Association is a Microbiologist!  American Heart Association Diet and Lifeystyle Recommendations   Exercise recommendations: The American Heart Association recommends 150 minutes of moderate intensity exercise weekly. Try 30 minutes of moderate intensity exercise 4-5 times per week. This could include walking, jogging, or swimming.   Important  Information About Sugar

## 2021-11-15 NOTE — Progress Notes (Signed)
Office Visit    Patient Name: Arthur Moore Date of Encounter: 11/15/2021  PCP:  Vernie Shanks, MD (Inactive)   [place]  Cardiologist:  Skeet Latch, MD  Advanced Practice Provider:  No care team member to display Electrophysiologist:  None     Chief Complaint    [name] is a 71 y.o. male  presents today for preop clearance, dyspnea   Past Medical History    Past Medical History:  Diagnosis Date   Aortic insufficiency    Arthritis    Asthma    Carotid bruit 07/09/2021   Cervical disc disease    Depression    Diastolic dysfunction 11/01/2374   Hyperlipidemia 08/24/2015   Hypertension    Lumbar disc disease    Psoriasis    Past Surgical History:  Procedure Laterality Date   CHOLECYSTECTOMY  2012   HERNIA REPAIR     LEFT HEART CATH AND CORONARY ANGIOGRAPHY N/A 07/08/2018   Procedure: LEFT HEART CATH AND CORONARY ANGIOGRAPHY;  Surgeon: Burnell Blanks, MD;  Location: [facility];  Service: Cardiovascular;  Laterality: N/A;   PAROTIDECTOMY Right 1992   PROSTATE SURGERY  2002, 2004   SINOSCOPY     SINUS EXPLORATION  2013   SPINE SURGERY  2013   L C7-T1 laminectomy and discectomy   TONSILLECTOMY      Allergies  Allergies  Allergen Reactions   Atorvastatin Other (See Comments)    Calf pain. Other reaction(s): leb numbness   Zolpidem Other (See Comments)    Other reaction(s): Drowsy    History of Present Illness    Arthur Moore is a 71 y.o. male with a hx of mild CAD, aortic insufficiency, diastolic dysfunction, asthma, hypertension, hyperlipidemia, psoriasis last seen 07/08/21.  Previously followed cardiologist in [place] due to mild AR.  Echo [facility] 2015 mild to moderate AI.  Prior monitor 2015 rare PACs and PVC.  Prior echo 12/05/2015 EF 55 to 28%, grade 2 diastolic dysfunction, mild to moderate AI.  Exercise Myoview 06/2016 EF 49%, asynchronous contraction, small defect in basal inferior region  thought to be diaphragmatic attenuation-able to achieve 7 METS on Bruce protocol. LHC 06/2018 mild nonobstructive coronary disease.  Echo 06/2020 EF 31-51%, grade 1 diastolic dysfunction, no RWMA, RV normal size and function, trivial MR, mild AI, mild aortic valve sclerosis without stenosis.  Carotid duplex 06/2021 bilateral minimal thickening or plaque.  ED visit 11/12/2021 with shortness of breath and left-sided chest discomfort described as aching.  Chest x-ray with no acute findings.  Shortness of breath thought to be related to nasal congestion and was treated for asthma exacerbation.  CBC, BMP, troponin X2, D-dimer, BNP negative.  Presents today for preop clearance for right knee scope with medial meniscus repair by [facility] Dr. Stann Mainland.  He enjoys playing pickle ball and is hopeful to return after surgery.  Continues to remain active despite injury achieving more than 4 METS.  He has not had any recurrent shortness of breath.  Associates previous symptoms with asthma. Reports no shortness of breath nor dyspnea on exertion. Reports no chest pain, pressure, or tightness. No edema, orthopnea, PND. Reports no palpitations.     EKGs/Labs/Other Studies Reviewed:   The following studies were reviewed today:    Echo 07/20/2020:  1. Left ventricular ejection fraction, by estimation, is 50 to 55%. The  left ventricle has low normal function. The left ventricle has no regional  wall motion abnormalities. The left ventricular internal cavity  size was  mildly dilated. Left ventricular  diastolic parameters are consistent with Grade I diastolic dysfunction  (impaired relaxation).   2. Right ventricular systolic function is normal. The right ventricular  size is normal.   3. The mitral valve is normal in structure. Trivial mitral valve  regurgitation. No evidence of mitral stenosis.   4. The aortic valve is tricuspid. Aortic valve regurgitation is mild.  Mild aortic valve sclerosis is present, with no  evidence of aortic valve  stenosis.   5. The inferior vena cava is normal in size with greater than 50%  respiratory variability, suggesting right atrial pressure of 3 mmHg.    CT Chest 04/11/2020: FINDINGS: Cardiovascular: Limited due to lack of IV contrast. Atherosclerotic calcifications are noted. No aneurysmal dilatation is seen. No cardiac enlargement is noted. Coronary calcifications are seen. The pulmonary artery as visualized shows no significant enlargement.   Mediastinum/Nodes: Thoracic inlet shows no focal abnormality. No sizable hilar or mediastinal adenopathy is noted. The esophagus is within normal limits.   Lungs/Pleura: Lungs are well aerated bilaterally. Mild emphysematous changes are seen. Previously seen nodule is felt to represent focal scarring as it was present on a prior CT examination from 2019 and improved aeration in this region on the current exam chose less parenchymal density. No sizable nodules are seen. No effusion or pneumothorax is noted. 2   Upper Abdomen: Visualized upper abdomen again demonstrates cystic changes within the right kidney stable from the previous exams.   Musculoskeletal: Degenerative changes of the thoracic spine are noted.   IMPRESSION: Previously seen suspicious nodular density is confirmed to represent focal scarring stable in appearance from a prior CT examination from 2019 as well as improved aeration in the bases showing left parenchymal density. These changes are likely related to adjacent osteophytic change within the thoracic spine.   No acute abnormality noted.   Aortic Atherosclerosis (ICD10-I70.0) and Emphysema (ICD10-J43.9).   LE Venous Doppler 02/06/2020: Summary:  RIGHT:  - No evidence of common femoral vein obstruction.     LEFT:  - There is no evidence of deep vein thrombosis in the lower extremity.    LHC 07/08/18: Dist RCA lesion is 20% stenosed. Prox Cx lesion is 10% stenosed. Dist LM to Ost LAD  lesion is 20% stenosed. Ost 1st Diag lesion is 50% stenosed. The left ventricular systolic function is normal. LV end diastolic pressure is normal. The left ventricular ejection fraction is 50-55% by visual estimate. There is no mitral valve regurgitation. Prox LAD lesion is 20% stenosed.   1. Mild non-obstructive CAD 2. Low normal LV systolic function   Echo 10/30/23: Study Conclusions   - Left ventricle: The cavity size was normal. There was mild   concentric hypertrophy. Systolic function was normal. The   estimated ejection fraction was in the range of 55% to 65%. Wall   motion was normal; there were no regional wall motion   abnormalities. Doppler parameters are consistent with abnormal   left ventricular relaxation (grade 1 diastolic dysfunction). - Aortic valve: There was mild to moderate regurgitation directed   eccentrically to the anterior leaflet of the mitral valve. - Left atrium: The atrium was normal in size. - Right ventricle: Systolic function was normal. - Pulmonary arteries: Systolic pressure was within the normal   range.   Coronary CT-A 05/15/17: IMPRESSION: 1. Coronary artery calcium score 361 Agatston units, placing the patient in the 78th percentile for age and gender, suggesting intermediate to high risk for future cardiac events.  2. Extensive calcified plaque in the proximal LAD, possible moderate stenosis though blooming artifact may make the disease look worse than it actually is.   FFR 0.83 in mid LAD. I do no think there is hemodynamically significant disease in the LAD. The LCx and RCA have no hemodynamically significant disease.   Exercise Myoview 06/26/16: Nuclear stress EF: 49%. Asynchronous contraction. The left ventricular ejection fraction is mildly decreased (45-54%). There was no ST segment deviation noted during stress. Defect 1: There is a small defect of mild severity present in the basal inferior location. Likely diaphragmatic  attenuation artifact. This is a low risk study. No ischemia identified   Echo 12/05/15: Study Conclusions   - Left ventricle: Global LV longitudinal strain is normal at -16.2%   There is a false tendon in the LV apex of no clinical   significance. The cavity size was normal. There was moderate   focal basal hypertrophy. Systolic function was normal. The   estimated ejection fraction was in the range of 55% to 60%. Wall   motion was normal; there were no regional wall motion   abnormalities. Features are consistent with a pseudonormal left   ventricular filling pattern, with concomitant abnormal relaxation   and increased filling pressure (grade 2 diastolic dysfunction). - Aortic valve: Trileaflet; mildly thickened leaflets. There was   mild to moderate regurgitation directed eccentrically in the LVOT   and towards the mitral anterior leaflet. - Mitral valve: There was mild regurgitation. - Pulmonic valve: There was mild regurgitation.   Echo 04/25/14: LVEF 60%.  Aortic valve sclerosis without stenosis. Mild aortic insufficiency.   ETT 04/2013:  Exercise 8 minutes and 44 seconds on a Bruce protocol. Peak heart rate 136. 9.8 METS. No ischemic changes.   24 Hour Holter: Underlying rhythm is sinus. Average heart rate 74, minimum 52, max 123. Rare PACs and PVCs. No significant arrhythmia noted.  EKG: EKG independently reviewed from 11/12/2021 revealed NSR 67 bpm with no acute ST/T wave changes.  Recent Labs: 11/12/2021: B Natriuretic Peptide 31.8; BUN 26; Creatinine, Ser 1.36; Hemoglobin 15.1; Platelets 135; Potassium 3.8; Sodium 139  Recent Lipid Panel    Component Value Date/Time   CHOL 181 10/18/2020 1130   TRIG 165 (H) 10/18/2020 1130   HDL 29 (L) 10/18/2020 1130   CHOLHDL 6.2 (H) 10/18/2020 1130   CHOLHDL 4.1 07/09/2018 0407   VLDL 25 07/09/2018 0407   LDLCALC 122 (H) 10/18/2020 1130   Home Medications   Current Meds  Medication Sig   albuterol (VENTOLIN HFA) 108 (90 Base)  MCG/ACT inhaler Inhale 2 puffs into the lungs every 4 (four) hours as needed for wheezing or shortness of breath.   aspirin EC 81 MG tablet Take 81 mg by mouth daily.   budesonide (RHINOCORT AQUA) 32 MCG/ACT nasal spray Use two sprays in each nostril once daily   budesonide-formoterol (SYMBICORT) 160-4.5 MCG/ACT inhaler Inhale 2 puffs into the lungs 2 (two) times daily.   busPIRone (BUSPAR) 15 MG tablet Take 1 tablet (15 mg total) by mouth 3 (three) times daily. (Patient taking differently: Take 15 mg by mouth daily.)   cetirizine (ZYRTEC ALLERGY) 10 MG tablet Take 1 tablet (10 mg total) by mouth daily as needed (can take an ectra dose during flare ups).   Cholecalciferol (VITAMIN D3) 5000 units TABS Take 5,000 Units by mouth daily.    lamoTRIgine (LAMICTAL) 200 MG tablet Take 1 tablet (200 mg total) by mouth 2 (two) times daily.   metoprolol tartrate (LOPRESSOR)  25 MG tablet TAKE 1/2 TABLET BY MOUTH TWICE A DAY   Multiple Vitamin (MULTIVITAMIN) capsule Take 1 capsule by mouth daily.   pantoprazole (PROTONIX) 40 MG tablet Take 1 tablet (40 mg total) by mouth daily.   Probiotic Product (PROBIOTIC DAILY PO) Take 1 tablet by mouth daily.   rosuvastatin (CRESTOR) 10 MG tablet Take 1 tablet by mouth daily.   zinc gluconate 50 MG tablet Take 1 tablet (50 mg total) by mouth daily.    Review of Systems      All other systems reviewed and are otherwise negative except as noted above.  Physical Exam    VS:  BP 118/70 (BP Location: Right Arm, Patient Position: Sitting, Cuff Size: Normal)   Pulse 65   Ht 5' 11.5" (1.816 m)   Wt 192 lb 11.2 oz (87.4 kg)   SpO2 96%   BMI 26.50 kg/m  , BMI Body mass index is 26.5 kg/m.  Wt Readings from Last 3 Encounters:  11/15/21 192 lb 11.2 oz (87.4 kg)  11/12/21 190 lb (86.2 kg)  11/12/21 193 lb 6.4 oz (87.7 kg)    GEN: Well nourished, well developed, in no acute distress. HEENT: normal. Neck: Supple, no JVD, carotid bruits, or masses. Cardiac: RRR, no  murmurs, rubs, or gallops. No clubbing, cyanosis, edema.  Radials/PT 2+ and equal bilaterally.  Respiratory:  Respirations regular and unlabored, clear to auscultation bilaterally. GI: Soft, nontender, nondistended. MS: No deformity or atrophy. Skin: Warm and dry, no rash. Neuro:  Strength and sensation are intact. Psych: Normal affect.  Assessment & Plan    Preop clearance - According to the Revised Cardiac Risk Index (RCRI), his Perioperative Risk of Major Cardiac Event is (%): 0.9. His Functional Capacity in METs is: 5.62 according to the Duke Activity Status Index (DASI).  He is deemed acceptable risk for the planned procedure without additional cardiovascular testing.  Will route to the surgical team via epic fax function.  CAD - Stable with no anginal symptoms. No indication for ischemic evaluation.  GDMT includes aspirin, metoprolol, rosuvastatin.  Has not required previous stenting.  He may hold aspirin if deemed necessary by surgical team.  HLD, LDL goal <70 -continue rosuvastatin 10 mg daily.  HTN - BP well controlled. Continue current antihypertensive regimen.  Relatively hypotensive in clinic however asymptomatic with no lightheadedness, dizziness.   Disposition: Follow up  06/2022  with Skeet Latch, MD or APP.  Signed, Loel Dubonnet, NP 11/15/2021, 5:33 PM [facility]

## 2021-11-21 ENCOUNTER — Telehealth: Payer: Self-pay

## 2021-11-21 ENCOUNTER — Ambulatory Visit: Payer: Medicare Other | Admitting: Physical Therapy

## 2021-11-21 DIAGNOSIS — M25561 Pain in right knee: Secondary | ICD-10-CM | POA: Diagnosis not present

## 2021-11-21 DIAGNOSIS — M25661 Stiffness of right knee, not elsewhere classified: Secondary | ICD-10-CM | POA: Diagnosis not present

## 2021-11-21 DIAGNOSIS — R600 Localized edema: Secondary | ICD-10-CM | POA: Diagnosis not present

## 2021-11-21 DIAGNOSIS — R262 Difficulty in walking, not elsewhere classified: Secondary | ICD-10-CM

## 2021-11-21 NOTE — Therapy (Signed)
OUTPATIENT PHYSICAL THERAPY LOWER EXTREMITY TREATMENT   Patient Name: Arthur Moore MRN: [id number] DOB:[birth year], 71 y.o., male Today's Date: 11/21/2021   PT End of Session - 11/21/21 1701     Visit Number 6    Date for PT Re-Evaluation 01/10/22    Authorization Type Medicare    PT Start Time 1700    PT Stop Time 1740    PT Time Calculation (min) 40 min                 Past Medical History:  Diagnosis Date   Aortic insufficiency    Arthritis    Asthma    Carotid bruit 07/09/2021   Cervical disc disease    Depression    Diastolic dysfunction 11/14/9468   Hyperlipidemia 08/24/2015   Hypertension    Lumbar disc disease    Psoriasis    Past Surgical History:  Procedure Laterality Date   CHOLECYSTECTOMY  2012   HERNIA REPAIR     LEFT HEART CATH AND CORONARY ANGIOGRAPHY N/A 07/08/2018   Procedure: LEFT HEART CATH AND CORONARY ANGIOGRAPHY;  Surgeon: Burnell Blanks, MD;  Location: [facility];  Service: Cardiovascular;  Laterality: N/A;   PAROTIDECTOMY Right 1992   PROSTATE SURGERY  2002, 2004   [place]     SINUS EXPLORATION  2013   SPINE SURGERY  2013   L C7-T1 laminectomy and discectomy   TONSILLECTOMY     Patient Active Problem List   Diagnosis Date Noted   Carotid bruit 07/09/2021   Aneurysm of ascending aorta ([place]) 06/28/2020   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   CAD (coronary artery disease) 07/09/2018   CKD (chronic kidney disease), 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   Chest pain of uncertain etiology 96/28/3662   Chronic cough 06/21/2018   Thrombocytopenia ([place]) 12/01/2016   Transaminitis 94/76/5465   Diastolic dysfunction 03/54/6568   MDD (major depressive disorder), recurrent severe, without psychosis ([place]) 10/13/2015   Hyperlipidemia 08/24/2015   Arthritis    Depression    Hypertension    Aortic insufficiency    Psoriasis    Cervical disc disease    Lumbar disc disease    Asthma 01/06/2015   Psoriatic  arthritis ([place]) 01/06/2015   Allergic rhinoconjunctivitis 01/06/2015   GERD (gastroesophageal reflux disease) 01/06/2015   Laryngopharyngeal reflux (LPR) 01/06/2015    PCP: Yaakov Guthrie  REFERRING PROVIDER: Victorino December  REFERRING DIAG: right medial meniscus tear  THERAPY DIAG:  Acute pain of right knee  Difficulty in walking, not elsewhere classified  Localized edema  Rationale for Evaluation and Treatment Rehabilitation  ONSET DATE: December 2022  SUBJECTIVE:   SUBJECTIVE STATEMENT: Pt arrived with knee pain and unchanged. Pending scope with meniscus repair and is requestng help to learn how to use crutches. Pt unsure of surgery date or wt bearing status  PERTINENT HISTORY: Asthma, depression, PTSD, HTN, CAD, CKD, AAA  PAIN:  Are you having pain? Yes: NPRS scale: 7/10 Pain location: mostly the medial/anterior right knee  Pain description: sharp  bending the knee, putting on shoes and socks, stairs, walking pain up to 9-10/10 Aggravating factors: bending, walking, shoes and stairs Relieving factors: ice, rest, pain at best a 3-4/10  PRECAUTIONS: None  WEIGHT BEARING RESTRICTIONS No  FALLS:  Has patient fallen in last 6 months? No  LIVING ENVIRONMENT: Lives with: lives with their family Lives in: House/apartment Stairs: Yes: Internal: 16 steps; can reach both Has following equipment at home: None  OCCUPATION: retired  PLOF: Independent  pickleball a few times a week, also did the gym, does yardwork  PATIENT GOALS increase ROM, have no pain, better motion, play pickleball   OBJECTIVE:   DIAGNOSTIC FINDINGS: MRI showed meniscus tear  COGNITION:  Overall cognitive status: Within functional limits for tasks assessed     SENSATION: WFL  EDEMA:  Mild laterally, no temperature changes  POSTURE: No Significant postural limitations  PALPATION: Tender patellar tendon, tender lateral joint lin  LOWER EXTREMITY ROM:  motions limited by pain  Active  ROM Right eval Left eval  Hip flexion    Hip extension    Hip abduction    Hip adduction    Hip internal rotation    Hip external rotation    Knee flexion 100   Knee extension 10   Ankle dorsiflexion    Ankle plantarflexion    Ankle inversion    Ankle eversion     (Blank rows = not tested)  LOWER EXTREMITY MMT:  Limited by pain  MMT Right eval Left eval  Hip flexion    Hip extension    Hip abduction    Hip adduction    Hip internal rotation    Hip external rotation    Knee flexion 4-   Knee extension 4-   Ankle dorsiflexion    Ankle plantarflexion    Ankle inversion    Ankle eversion     (Blank rows = not tested)  LOWER EXTREMITY SPECIAL TESTS:  Deferred due to MRI  FUNCTIONAL TESTS:  30 seconds chair stand test  18 seconds  GAIT: Assistive device utilized: None Level of assistance: Complete Independence Comments: no device, very slow, very stiff legged gait, very antalgic on the right, stairs one at a time    TODAY'S TREATMENT:   11/21/21  Pt states meniscus repair which is usually NWB and most times knee immoblizer Called MD office and receptionist agreed most likely NWB but not 100 for sure and MDs/staff gone  Gait train with crutches FWB,PWB and NWB- pt really struggled with NWB so we switced to RW and pt did much better . He asked if he could get RW  and if insurnae paid and told him he needed script from MD   Worked on stairs NWB with 2 rails ( pt only has 1 rail and wall at home) pt struggled so showed him how to scoot on butt and suggested thinking about sleeping/staying down stairs and he agreed  Asked pt to call MD and find out what anticipated wt bearing will be and if immobilizer and f/u with [place] for more specific demo as we reviewed many tech today session   11/04/21 Nustep L2 x79mn LAQ 2# 2x10 HS curls with greenTb 2x10 Step ups 6" 20reps  STS 2x10  SLR 2x10 Bridges 2x10 HS stretch 30s  Vaso 32F, med, 180ms   10/21/21 Deferred  Nu-Step due to pain all exercises done with RLE SLR 2 x 10- no weight due to pain level. SLR with ER, 8 reps, then stopped due to pain. Sidelying hip abduction-8 reps, C/O hip pain.  Nustep L2, 51m37m  AAROM knee flexion 5s holds x10  SAQ 2x10  LAQ 2# 2x10  Hamstring curls redTB 2x10 Alt cone taps 20reps  Vaso right knee medium pressure, 33 degrees F   PATIENT EDUCATION:  Education details: see below Person educated: Patient Education method: ExpConsulting civil engineeremMedia plannererbal cues, and Handouts Education comprehension: verbalized understanding   HOME EXERCISE PROGRAM: Access Code: [email or web address] URL: [email or web address] Date: 10/10/2021 Prepared  by: Lum Babe  Exercises - Supine Short Arc Quad  - 2 x daily - 7 x weekly - 1 sets - 10 reps - 3 hold - Seated Knee Flexion AAROM  - 2 x daily - 7 x weekly - 1 sets - 10 reps - 3 hold - Supine Ankle Pumps  - 2 x daily - 7 x weekly - 1 sets - 10 reps - 1 hold  ASSESSMENT:  CLINICAL IMPRESSION:Asked pt to call MD and find out what anticipated wt bearing will be and if immobilizer and f/u with [place] for more specific demo as we reviewed many tech today session.   OBJECTIVE IMPAIRMENTS Abnormal gait, decreased activity tolerance, decreased balance, decreased coordination, decreased endurance, decreased mobility, difficulty walking, decreased ROM, decreased strength, increased edema, increased muscle spasms, impaired flexibility, and pain.   ACTIVITY LIMITATIONS lifting, bending, standing, squatting, stairs, transfers, and locomotion level  REHAB POTENTIAL: Good  CLINICAL DECISION MAKING: Stable/uncomplicated  EVALUATION COMPLEXITY: Low  .hp GOALS: Goals reviewed with patient? Yes  SHORT TERM GOALS: Target date: 10/24/21 Independent with initial HEP Goal status: Patient given HEP, has had difficulty completing it.   LONG TERM GOALS: Target date: 01/02/22  Understand RICE Goal status: INITIAL  2.  Decrease pain  overall 50% Goal status: INITIAL  3.  Increase AROM of the right knee to 0-120 degrees flexion Goal status: INITIAL  4.  Decresae TUG time tpo 11 seconds Goal status: INITIAL  5.  Go up and down stairs step over step Goal status: INITIAL   PLAN: PT FREQUENCY: 1-2x/week  PT DURATION: 12 weeks  PLANNED INTERVENTIONS: Therapeutic exercises, Therapeutic activity, Neuromuscular re-education, Balance training, Gait training, Patient/Family education, Joint manipulation, Joint mobilization, Dry Needling, Electrical stimulation, Cryotherapy, Moist heat, Taping, Vasopneumatic device, Ultrasound, and Manual therapy  PLAN FOR NEXT SESSION: Asked pt to call MD and find out what anticipated wt bearing will be and if immobilizer and f/u with [place] for more specific demo as we reviewed many tech today session   Angie Marilynne Dupuis PTA 11/21/2021, 5:01 PM [address] [place], [place], [address] Phone: [phone]   Fax:  [phone]  Patient Details  Name: Arthur Moore MRN: [id number] Date of Birth: Jan 21, 1951 Referring Provider:  Vernie Shanks, MD  Encounter Date: 11/21/2021   Laqueta Carina, PTA 11/21/2021, 5:01 PM  [address] [place], [place], [address] Phone: [phone]   Fax:  [phone]

## 2021-11-21 NOTE — Telephone Encounter (Signed)
-----   Message from Derl Barrow, [place] sent at 11/12/2021 12:38 PM EDT ----- Regarding: ENT Referral Ambulatory referral has been placed for Dr. Benjamine Mola ENT for Hoarsness.

## 2021-11-26 DIAGNOSIS — J324 Chronic pansinusitis: Secondary | ICD-10-CM | POA: Diagnosis not present

## 2021-11-26 DIAGNOSIS — J310 Chronic rhinitis: Secondary | ICD-10-CM | POA: Diagnosis not present

## 2021-11-26 DIAGNOSIS — J343 Hypertrophy of nasal turbinates: Secondary | ICD-10-CM | POA: Diagnosis not present

## 2021-11-26 HISTORY — PX: KNEE ARTHROSCOPY W/ MENISCAL REPAIR: SHX1877

## 2021-11-28 DIAGNOSIS — I10 Essential (primary) hypertension: Secondary | ICD-10-CM | POA: Diagnosis not present

## 2021-11-28 DIAGNOSIS — J4540 Moderate persistent asthma, uncomplicated: Secondary | ICD-10-CM | POA: Diagnosis not present

## 2021-11-28 DIAGNOSIS — G4700 Insomnia, unspecified: Secondary | ICD-10-CM | POA: Diagnosis not present

## 2021-11-28 DIAGNOSIS — I2510 Atherosclerotic heart disease of native coronary artery without angina pectoris: Secondary | ICD-10-CM | POA: Diagnosis not present

## 2021-11-28 DIAGNOSIS — N400 Enlarged prostate without lower urinary tract symptoms: Secondary | ICD-10-CM | POA: Diagnosis not present

## 2021-11-28 DIAGNOSIS — K219 Gastro-esophageal reflux disease without esophagitis: Secondary | ICD-10-CM | POA: Diagnosis not present

## 2021-11-28 DIAGNOSIS — E7800 Pure hypercholesterolemia, unspecified: Secondary | ICD-10-CM | POA: Diagnosis not present

## 2021-12-10 ENCOUNTER — Ambulatory Visit (INDEPENDENT_AMBULATORY_CARE_PROVIDER_SITE_OTHER): Payer: Medicare Other | Admitting: Allergy and Immunology

## 2021-12-10 ENCOUNTER — Encounter: Payer: Self-pay | Admitting: Allergy and Immunology

## 2021-12-10 VITALS — BP 126/78 | HR 67 | Temp 98.1°F | Resp 16 | Ht 71.0 in | Wt 195.8 lb

## 2021-12-10 DIAGNOSIS — L299 Pruritus, unspecified: Secondary | ICD-10-CM

## 2021-12-10 DIAGNOSIS — J4540 Moderate persistent asthma, uncomplicated: Secondary | ICD-10-CM

## 2021-12-10 DIAGNOSIS — I2510 Atherosclerotic heart disease of native coronary artery without angina pectoris: Secondary | ICD-10-CM | POA: Diagnosis not present

## 2021-12-10 DIAGNOSIS — J3089 Other allergic rhinitis: Secondary | ICD-10-CM | POA: Diagnosis not present

## 2021-12-10 DIAGNOSIS — K219 Gastro-esophageal reflux disease without esophagitis: Secondary | ICD-10-CM

## 2021-12-10 NOTE — Patient Instructions (Signed)
  1. Continue Symbicort 160 / Breztri - 2 inhalations 2 times a day  2. Continue Flonase 1-2 sprays each nostril one time per day   3. Continue Protonix 40 mg 1-2 times per day  4. Continue the following if needed:   A. Zyrtec 10 mg - 1-2 tabs 1-2 times per day (MAX='40mg'$ /day)  B. Albuterol HFA  C. Dermatology prescribed topical agent  5.  Return to clinic in 6 months or earlier if problem  6. Obtain fall flu vaccine and RSV vaccine

## 2021-12-10 NOTE — Progress Notes (Unsigned)
[facility] [place] [facility] [place]   Follow-up Note  Referring Provider: No ref. provider found Primary Provider: Patient, No Pcp Per Date of Office Visit: 12/10/2021  Subjective:   Arthur Moore (DOB: 01-Feb-1951) is a 71 y.o. male who returns to the [facility] on 12/10/2021 in re-evaluation of the following:  HPI: Arthur Moore returns to this clinic in evaluation of asthma, allergic rhinoconjunctivitis, LPR, and history of forearm pruritus.  His last visit to this clinic was 12 November 2021.  During his last visit he was having significant dyspnea both at rest and exercise and with eating and with speaking and he was also having issues with hoarseness.  We referred him back to his cardiologist and ENT evaluation both of which felt that he had very little issues with his heart or his throat respectively.  Fortunately, almost all of his dyspnea and his hoarseness has for the most part resolved and he does not need to use a short acting bronchodilator at this point in time.  He has not started his Judithann Sauger but has continued to use a supply of Symbicort.  He continues on Flonase and his nose is doing very well.  He continues on Protonix currently at 1 time per day and his reflux is doing very well.  He continues on a topical steroid prescribed by dermatology for his forearm pruritus which is under very good control.  He is scheduled to have a right knee meniscus repair tomorrow.  Allergies as of 12/10/2021       Reactions   Atorvastatin Other (See Comments)   Calf pain. Other reaction(s): leb numbness   Zolpidem Other (See Comments)   Other reaction(s): Drowsy        Medication List    albuterol 108 (90 Base) MCG/ACT inhaler Commonly known as: VENTOLIN HFA Inhale 2 puffs into the lungs every 4 (four) hours as needed for wheezing or shortness of breath.   aspirin EC 81 MG tablet Take 81 mg by mouth daily.   budesonide 32 MCG/ACT nasal  spray Commonly known as: RHINOCORT AQUA Use two sprays in each nostril once daily   budesonide-formoterol 160-4.5 MCG/ACT inhaler Commonly known as: SYMBICORT Inhale 2 puffs into the lungs 2 (two) times daily.   busPIRone 15 MG tablet Commonly known as: BUSPAR Take 1 tablet (15 mg total) by mouth 3 (three) times daily.   cetirizine 10 MG tablet Commonly known as: ZyrTEC Allergy Take 1 tablet (10 mg total) by mouth daily as needed (can take an ectra dose during flare ups).   lamoTRIgine 200 MG tablet Commonly known as: LAMICTAL Take 1 tablet (200 mg total) by mouth 2 (two) times daily.   metoprolol tartrate 25 MG tablet Commonly known as: LOPRESSOR TAKE 1/2 TABLET BY MOUTH TWICE A DAY   multivitamin capsule Take 1 capsule by mouth daily.   nitroGLYCERIN 0.4 MG SL tablet Commonly known as: NITROSTAT Place 1 tablet (0.4 mg total) under the tongue every 5 (five) minutes as needed for chest pain.   pantoprazole 40 MG tablet Commonly known as: PROTONIX Take 1 tablet (40 mg total) by mouth daily.   PROBIOTIC DAILY PO Take 1 tablet by mouth daily.   rosuvastatin 10 MG tablet Commonly known as: CRESTOR Take 1 tablet by mouth daily.   Vitamin D3 125 MCG (5000 UT) Tabs Take 5,000 Units by mouth daily.   zinc gluconate 50 MG tablet Take 1 tablet (50 mg total) by mouth daily.  Past Medical History:  Diagnosis Date   Aortic insufficiency    Arthritis    Asthma    Carotid bruit 07/09/2021   Cervical disc disease    Depression    Diastolic dysfunction 0/62/3762   Hyperlipidemia 08/24/2015   Hypertension    Lumbar disc disease    Psoriasis     Past Surgical History:  Procedure Laterality Date   CHOLECYSTECTOMY  2012   HERNIA REPAIR     LEFT HEART CATH AND CORONARY ANGIOGRAPHY N/A 07/08/2018   Procedure: LEFT HEART CATH AND CORONARY ANGIOGRAPHY;  Surgeon: Burnell Blanks, MD;  Location: [facility];  Service: Cardiovascular;  Laterality: N/A;    PAROTIDECTOMY Right 1992   PROSTATE SURGERY  2002, 2004   [facility]     SINUS EXPLORATION  2013   SPINE SURGERY  2013   L C7-T1 laminectomy and discectomy   TONSILLECTOMY      Review of systems negative except as noted in HPI / PMHx or noted below:  Review of Systems  Constitutional: Negative.   HENT: Negative.    Eyes: Negative.   Respiratory: Negative.    Cardiovascular: Negative.   Gastrointestinal: Negative.   Genitourinary: Negative.   Musculoskeletal: Negative.   Skin: Negative.   Neurological: Negative.   Endo/Heme/Allergies: Negative.   Psychiatric/Behavioral: Negative.       Objective:   Vitals:   12/10/21 1112  BP: 126/78  Pulse: 67  Resp: 16  Temp: 98.1 F (36.7 C)  SpO2: 95%   Height: '5\' 11"'$  (180.3 cm)  Weight: 195 lb 12.8 oz (88.8 kg)   Physical Exam Constitutional:      Appearance: He is not diaphoretic.  HENT:     Head: Normocephalic.     Right Ear: Tympanic membrane, ear canal and external ear normal.     Left Ear: Tympanic membrane, ear canal and external ear normal.     Nose: Nose normal. No mucosal edema or rhinorrhea.     Mouth/Throat:     Pharynx: Uvula midline. No oropharyngeal exudate.  Eyes:     Conjunctiva/sclera: Conjunctivae normal.  Neck:     Thyroid: No thyromegaly.     Trachea: Trachea normal. No tracheal tenderness or tracheal deviation.  Cardiovascular:     Rate and Rhythm: Normal rate and regular rhythm.     Heart sounds: Normal heart sounds, S1 normal and S2 normal. No murmur heard. Pulmonary:     Effort: No respiratory distress.     Breath sounds: Normal breath sounds. No stridor. No wheezing or rales.  Lymphadenopathy:     Head:     Right side of head: No tonsillar adenopathy.     Left side of head: No tonsillar adenopathy.     Cervical: No cervical adenopathy.  Skin:    Findings: No erythema or rash.     Nails: There is no clubbing.  Neurological:     Mental Status: He is alert.     Diagnostics:     Spirometry was performed and demonstrated an FEV1 of 3.01 at 91 % of predicted.   Assessment and Plan:   1. Asthma, moderate persistent, well-controlled   2. Other allergic rhinitis   3. LPRD (laryngopharyngeal reflux disease)   4. Pruritic disorder    1. Continue Symbicort 160 / Breztri - 2 inhalations 2 times a day  2. Continue Flonase 1-2 sprays each nostril one time per day   3. Continue Protonix 40 mg 1-2 times per day  4. Continue  the following if needed:   A. Zyrtec 10 mg - 1-2 tabs 1-2 times per day (MAX='40mg'$ /day)  B. Albuterol HFA  C. Dermatology prescribed topical agent  5.  Return to clinic in 6 months or earlier if problem  6. Obtain fall flu vaccine and RSV vaccine  [name] appears to be doing quite well regarding his airway and his reflux issue on his current plan of anti-inflammatory agents for his airway and a proton pump inhibitor use on a consistent basis.  He will continue on this plan and we will see him back in this clinic in 6 months or earlier if there is a problem.  He will be having right knee surgery tomorrow and I made the recommendation that he use a spinal anesthetic instead of a general anesthetic especially given all of his respiratory issues.   Allena Katz, MD Allergy / Immunology [place]

## 2021-12-11 ENCOUNTER — Encounter: Payer: Self-pay | Admitting: Allergy and Immunology

## 2021-12-11 DIAGNOSIS — M65861 Other synovitis and tenosynovitis, right lower leg: Secondary | ICD-10-CM | POA: Diagnosis not present

## 2021-12-11 DIAGNOSIS — S83221A Peripheral tear of medial meniscus, current injury, right knee, initial encounter: Secondary | ICD-10-CM | POA: Diagnosis not present

## 2021-12-11 DIAGNOSIS — G8918 Other acute postprocedural pain: Secondary | ICD-10-CM | POA: Diagnosis not present

## 2021-12-11 DIAGNOSIS — Y999 Unspecified external cause status: Secondary | ICD-10-CM | POA: Diagnosis not present

## 2021-12-11 DIAGNOSIS — M948X1 Other specified disorders of cartilage, shoulder: Secondary | ICD-10-CM | POA: Diagnosis not present

## 2021-12-11 DIAGNOSIS — M659 Synovitis and tenosynovitis, unspecified: Secondary | ICD-10-CM | POA: Diagnosis not present

## 2021-12-11 DIAGNOSIS — M794 Hypertrophy of (infrapatellar) fat pad: Secondary | ICD-10-CM | POA: Diagnosis not present

## 2021-12-11 DIAGNOSIS — M94261 Chondromalacia, right knee: Secondary | ICD-10-CM | POA: Diagnosis not present

## 2021-12-11 DIAGNOSIS — S83261A Peripheral tear of lateral meniscus, current injury, right knee, initial encounter: Secondary | ICD-10-CM | POA: Diagnosis not present

## 2021-12-11 DIAGNOSIS — X58XXXA Exposure to other specified factors, initial encounter: Secondary | ICD-10-CM | POA: Diagnosis not present

## 2021-12-11 MED ORDER — FLUTICASONE PROPIONATE 50 MCG/ACT NA SUSP: NASAL | 2 refills | Status: DC

## 2021-12-11 MED ORDER — PANTOPRAZOLE SODIUM 40 MG PO TBEC: DELAYED_RELEASE_TABLET | ORAL | 1 refills | Status: DC

## 2021-12-11 MED ORDER — CETIRIZINE HCL 10 MG PO TABS: 10.0000 mg | ORAL_TABLET | Freq: Every day | ORAL | 5 refills | Status: DC | PRN

## 2021-12-11 MED ORDER — ALBUTEROL SULFATE HFA 108 (90 BASE) MCG/ACT IN AERS
2.0000 | INHALATION_SPRAY | RESPIRATORY_TRACT | 1 refills | Status: DC | PRN
Start: 2021-12-11 — End: 2021-12-20

## 2021-12-11 MED ORDER — BUDESONIDE-FORMOTEROL FUMARATE 160-4.5 MCG/ACT IN AERO: 2.0000 | INHALATION_SPRAY | Freq: Two times a day (BID) | RESPIRATORY_TRACT | 1 refills | Status: DC

## 2021-12-13 ENCOUNTER — Telehealth: Payer: Self-pay | Admitting: Allergy and Immunology

## 2021-12-13 NOTE — Telephone Encounter (Signed)
Patients wife called and states 4 medications were sent in to the pharmacy which she wasn't aware of. She picked them up without realizing what the medication was and now isn't able to return 2 of them. She wanted to just note that all patients medications are covered through the [facility] and would not like them sent to his pharmacy.

## 2021-12-20 ENCOUNTER — Telehealth: Payer: Self-pay

## 2021-12-20 MED ORDER — ALBUTEROL SULFATE HFA 108 (90 BASE) MCG/ACT IN AERS: 2.0000 | INHALATION_SPRAY | RESPIRATORY_TRACT | 1 refills | Status: DC | PRN

## 2021-12-20 MED ORDER — PANTOPRAZOLE SODIUM 40 MG PO TBEC: DELAYED_RELEASE_TABLET | ORAL | 1 refills | Status: DC

## 2021-12-20 MED ORDER — FLUTICASONE PROPIONATE 50 MCG/ACT NA SUSP: NASAL | 2 refills | Status: DC

## 2021-12-20 MED ORDER — CETIRIZINE HCL 10 MG PO TABS: 10.0000 mg | ORAL_TABLET | Freq: Every day | ORAL | 5 refills | Status: DC | PRN

## 2021-12-20 NOTE — Addendum Note (Signed)
Addended by: Larence Penning on: 12/20/2021 04:30 PM   Modules accepted: Orders

## 2021-12-20 NOTE — Telephone Encounter (Signed)
I called the patient's wife due to them not answering the house phone or his cell phone. The patient plans to start breztri sample to see if that will help more than the symbicort. They are going to talk to the provider who prescribed the hydrocodone about their concerns. If his symptoms worsen they plan to go to the ED. I discontinued his medications that were sent to [facility] and resent them to the [facility] in [place] as requested by the patient's wife. Thank you Dr. Nelva Bush.

## 2021-12-20 NOTE — Telephone Encounter (Signed)
Patient called because he is having issues with his breathing, which started this morning. He did 2 puffs of the symbicort this morning and had to use his albuterol inhaler at 2:30pm. He also has a cough with clear foaming mucus.   He had knee surgery 9 days ago and spoke to one of the post op nurses who told him that his embolism concerns are invalid and to call his asthma doctor. He sees Dr. Neldon Mc who is out of office on Fridays.   He has been on hydrocodone for 9 days and just picked up a refill yesterday. He also has samples of breztri, but was told he could complete the symbicort inhalers he has before starting. He has one new symbicort left.   Please advise on if he should start using the breztri inhaler 2 inhalations 2 times a day for his asthma symptoms vs the symbicort. He also would like to know if the hydrocodone could be the cause for his symptoms.

## 2021-12-20 NOTE — Telephone Encounter (Signed)
error 

## 2022-01-04 DIAGNOSIS — Z9181 History of falling: Secondary | ICD-10-CM | POA: Diagnosis not present

## 2022-01-04 DIAGNOSIS — H9193 Unspecified hearing loss, bilateral: Secondary | ICD-10-CM | POA: Diagnosis not present

## 2022-01-04 DIAGNOSIS — H409 Unspecified glaucoma: Secondary | ICD-10-CM | POA: Diagnosis not present

## 2022-01-04 DIAGNOSIS — F419 Anxiety disorder, unspecified: Secondary | ICD-10-CM | POA: Diagnosis not present

## 2022-01-04 DIAGNOSIS — K7460 Unspecified cirrhosis of liver: Secondary | ICD-10-CM | POA: Diagnosis not present

## 2022-01-04 DIAGNOSIS — J45909 Unspecified asthma, uncomplicated: Secondary | ICD-10-CM | POA: Diagnosis not present

## 2022-01-04 DIAGNOSIS — F319 Bipolar disorder, unspecified: Secondary | ICD-10-CM | POA: Diagnosis not present

## 2022-01-04 DIAGNOSIS — Z4789 Encounter for other orthopedic aftercare: Secondary | ICD-10-CM | POA: Diagnosis not present

## 2022-01-04 DIAGNOSIS — M5116 Intervertebral disc disorders with radiculopathy, lumbar region: Secondary | ICD-10-CM | POA: Diagnosis not present

## 2022-01-04 DIAGNOSIS — M5030 Other cervical disc degeneration, unspecified cervical region: Secondary | ICD-10-CM | POA: Diagnosis not present

## 2022-01-04 DIAGNOSIS — I10 Essential (primary) hypertension: Secondary | ICD-10-CM | POA: Diagnosis not present

## 2022-01-04 DIAGNOSIS — Z7982 Long term (current) use of aspirin: Secondary | ICD-10-CM | POA: Diagnosis not present

## 2022-01-04 DIAGNOSIS — G8929 Other chronic pain: Secondary | ICD-10-CM | POA: Diagnosis not present

## 2022-01-04 DIAGNOSIS — E7800 Pure hypercholesterolemia, unspecified: Secondary | ICD-10-CM | POA: Diagnosis not present

## 2022-01-04 DIAGNOSIS — Q7649 Other congenital malformations of spine, not associated with scoliosis: Secondary | ICD-10-CM | POA: Diagnosis not present

## 2022-01-04 DIAGNOSIS — M5481 Occipital neuralgia: Secondary | ICD-10-CM | POA: Diagnosis not present

## 2022-01-04 DIAGNOSIS — M19012 Primary osteoarthritis, left shoulder: Secondary | ICD-10-CM | POA: Diagnosis not present

## 2022-01-04 DIAGNOSIS — M7532 Calcific tendinitis of left shoulder: Secondary | ICD-10-CM | POA: Diagnosis not present

## 2022-01-04 DIAGNOSIS — Z9049 Acquired absence of other specified parts of digestive tract: Secondary | ICD-10-CM | POA: Diagnosis not present

## 2022-01-04 DIAGNOSIS — K219 Gastro-esophageal reflux disease without esophagitis: Secondary | ICD-10-CM | POA: Diagnosis not present

## 2022-01-04 DIAGNOSIS — I2510 Atherosclerotic heart disease of native coronary artery without angina pectoris: Secondary | ICD-10-CM | POA: Diagnosis not present

## 2022-01-07 DIAGNOSIS — G8929 Other chronic pain: Secondary | ICD-10-CM | POA: Diagnosis not present

## 2022-01-07 DIAGNOSIS — Z4789 Encounter for other orthopedic aftercare: Secondary | ICD-10-CM | POA: Diagnosis not present

## 2022-01-07 DIAGNOSIS — M5116 Intervertebral disc disorders with radiculopathy, lumbar region: Secondary | ICD-10-CM | POA: Diagnosis not present

## 2022-01-07 DIAGNOSIS — F419 Anxiety disorder, unspecified: Secondary | ICD-10-CM | POA: Diagnosis not present

## 2022-01-07 DIAGNOSIS — M5481 Occipital neuralgia: Secondary | ICD-10-CM | POA: Diagnosis not present

## 2022-01-07 DIAGNOSIS — M5030 Other cervical disc degeneration, unspecified cervical region: Secondary | ICD-10-CM | POA: Diagnosis not present

## 2022-01-08 ENCOUNTER — Institutional Professional Consult (permissible substitution): Payer: Medicare Other | Admitting: Adult Health

## 2022-01-09 DIAGNOSIS — M5481 Occipital neuralgia: Secondary | ICD-10-CM | POA: Diagnosis not present

## 2022-01-09 DIAGNOSIS — Z4789 Encounter for other orthopedic aftercare: Secondary | ICD-10-CM | POA: Diagnosis not present

## 2022-01-09 DIAGNOSIS — M5030 Other cervical disc degeneration, unspecified cervical region: Secondary | ICD-10-CM | POA: Diagnosis not present

## 2022-01-09 DIAGNOSIS — M5116 Intervertebral disc disorders with radiculopathy, lumbar region: Secondary | ICD-10-CM | POA: Diagnosis not present

## 2022-01-09 DIAGNOSIS — G8929 Other chronic pain: Secondary | ICD-10-CM | POA: Diagnosis not present

## 2022-01-09 DIAGNOSIS — F419 Anxiety disorder, unspecified: Secondary | ICD-10-CM | POA: Diagnosis not present

## 2022-01-14 DIAGNOSIS — Z4789 Encounter for other orthopedic aftercare: Secondary | ICD-10-CM | POA: Diagnosis not present

## 2022-01-14 DIAGNOSIS — M5030 Other cervical disc degeneration, unspecified cervical region: Secondary | ICD-10-CM | POA: Diagnosis not present

## 2022-01-14 DIAGNOSIS — F419 Anxiety disorder, unspecified: Secondary | ICD-10-CM | POA: Diagnosis not present

## 2022-01-14 DIAGNOSIS — G8929 Other chronic pain: Secondary | ICD-10-CM | POA: Diagnosis not present

## 2022-01-14 DIAGNOSIS — M5116 Intervertebral disc disorders with radiculopathy, lumbar region: Secondary | ICD-10-CM | POA: Diagnosis not present

## 2022-01-14 DIAGNOSIS — M5481 Occipital neuralgia: Secondary | ICD-10-CM | POA: Diagnosis not present

## 2022-01-16 ENCOUNTER — Other Ambulatory Visit: Payer: Self-pay | Admitting: Gastroenterology

## 2022-01-16 DIAGNOSIS — F419 Anxiety disorder, unspecified: Secondary | ICD-10-CM | POA: Diagnosis not present

## 2022-01-16 DIAGNOSIS — M5116 Intervertebral disc disorders with radiculopathy, lumbar region: Secondary | ICD-10-CM | POA: Diagnosis not present

## 2022-01-16 DIAGNOSIS — M5030 Other cervical disc degeneration, unspecified cervical region: Secondary | ICD-10-CM | POA: Diagnosis not present

## 2022-01-16 DIAGNOSIS — K862 Cyst of pancreas: Secondary | ICD-10-CM

## 2022-01-16 DIAGNOSIS — G8929 Other chronic pain: Secondary | ICD-10-CM | POA: Diagnosis not present

## 2022-01-16 DIAGNOSIS — M5481 Occipital neuralgia: Secondary | ICD-10-CM | POA: Diagnosis not present

## 2022-01-16 DIAGNOSIS — K7460 Unspecified cirrhosis of liver: Secondary | ICD-10-CM

## 2022-01-16 DIAGNOSIS — Z4789 Encounter for other orthopedic aftercare: Secondary | ICD-10-CM | POA: Diagnosis not present

## 2022-01-19 DIAGNOSIS — J019 Acute sinusitis, unspecified: Secondary | ICD-10-CM | POA: Diagnosis not present

## 2022-01-20 ENCOUNTER — Telehealth: Payer: Self-pay | Admitting: Adult Health

## 2022-01-20 ENCOUNTER — Other Ambulatory Visit: Payer: Self-pay

## 2022-01-20 MED ORDER — LORAZEPAM 0.5 MG PO TABS: 0.5000 mg | ORAL_TABLET | Freq: Two times a day (BID) | ORAL | 0 refills | Status: DC

## 2022-01-20 NOTE — Telephone Encounter (Signed)
Please see message. There are no BZ on PMPD and I don't see any in his medication history, though I know he receives services through the [facility]. Xanax is listed in your note for medication trials.

## 2022-01-20 NOTE — Telephone Encounter (Signed)
Ok to send

## 2022-01-20 NOTE — Telephone Encounter (Signed)
Pended.

## 2022-01-20 NOTE — Telephone Encounter (Signed)
Patient called in stating that he just had surgery and "anxiety is through the roof." He would like to have temporary 30 day supply of Lorazepam sent to pharmacy. Ph: [facility]

## 2022-01-21 DIAGNOSIS — G8929 Other chronic pain: Secondary | ICD-10-CM | POA: Diagnosis not present

## 2022-01-21 DIAGNOSIS — M5030 Other cervical disc degeneration, unspecified cervical region: Secondary | ICD-10-CM | POA: Diagnosis not present

## 2022-01-21 DIAGNOSIS — M5116 Intervertebral disc disorders with radiculopathy, lumbar region: Secondary | ICD-10-CM | POA: Diagnosis not present

## 2022-01-21 DIAGNOSIS — Z4789 Encounter for other orthopedic aftercare: Secondary | ICD-10-CM | POA: Diagnosis not present

## 2022-01-21 DIAGNOSIS — M5481 Occipital neuralgia: Secondary | ICD-10-CM | POA: Diagnosis not present

## 2022-01-21 DIAGNOSIS — F419 Anxiety disorder, unspecified: Secondary | ICD-10-CM | POA: Diagnosis not present

## 2022-01-22 ENCOUNTER — Institutional Professional Consult (permissible substitution): Payer: Medicare Other | Admitting: Primary Care

## 2022-01-23 DIAGNOSIS — M5481 Occipital neuralgia: Secondary | ICD-10-CM | POA: Diagnosis not present

## 2022-01-23 DIAGNOSIS — M5030 Other cervical disc degeneration, unspecified cervical region: Secondary | ICD-10-CM | POA: Diagnosis not present

## 2022-01-23 DIAGNOSIS — G8929 Other chronic pain: Secondary | ICD-10-CM | POA: Diagnosis not present

## 2022-01-23 DIAGNOSIS — F419 Anxiety disorder, unspecified: Secondary | ICD-10-CM | POA: Diagnosis not present

## 2022-01-23 DIAGNOSIS — M5116 Intervertebral disc disorders with radiculopathy, lumbar region: Secondary | ICD-10-CM | POA: Diagnosis not present

## 2022-01-23 DIAGNOSIS — Z4789 Encounter for other orthopedic aftercare: Secondary | ICD-10-CM | POA: Diagnosis not present

## 2022-01-24 DIAGNOSIS — R748 Abnormal levels of other serum enzymes: Secondary | ICD-10-CM | POA: Diagnosis not present

## 2022-01-27 DIAGNOSIS — M5030 Other cervical disc degeneration, unspecified cervical region: Secondary | ICD-10-CM | POA: Diagnosis not present

## 2022-01-27 DIAGNOSIS — G8929 Other chronic pain: Secondary | ICD-10-CM | POA: Diagnosis not present

## 2022-01-27 DIAGNOSIS — M5481 Occipital neuralgia: Secondary | ICD-10-CM | POA: Diagnosis not present

## 2022-01-27 DIAGNOSIS — M5116 Intervertebral disc disorders with radiculopathy, lumbar region: Secondary | ICD-10-CM | POA: Diagnosis not present

## 2022-01-27 DIAGNOSIS — F419 Anxiety disorder, unspecified: Secondary | ICD-10-CM | POA: Diagnosis not present

## 2022-01-27 DIAGNOSIS — Z4789 Encounter for other orthopedic aftercare: Secondary | ICD-10-CM | POA: Diagnosis not present

## 2022-01-28 NOTE — Therapy (Signed)
OUTPATIENT PHYSICAL THERAPY LOWER EXTREMITY EVALUATION   Patient Name: Arthur Moore MRN: [id number] DOB:1951/04/18, 71 y.o., male Today's Date: 01/29/2022   PT End of Session - 01/29/22 1439     Visit Number 1    Date for PT Re-Evaluation 04/23/22    Progress Note Due on Visit 10    PT Start Time 1440    PT Stop Time 1525    PT Time Calculation (min) 45 min    Activity Tolerance Patient tolerated treatment well    Behavior During Therapy [facility] for tasks assessed/performed             Past Medical History:  Diagnosis Date   Aortic insufficiency    Arthritis    Asthma    Carotid bruit 07/09/2021   Cervical disc disease    Depression    Diastolic dysfunction 2/50/5397   Hyperlipidemia 08/24/2015   Hypertension    Lumbar disc disease    Psoriasis    Past Surgical History:  Procedure Laterality Date   CHOLECYSTECTOMY  2012   HERNIA REPAIR     LEFT HEART CATH AND CORONARY ANGIOGRAPHY N/A 07/08/2018   Procedure: LEFT HEART CATH AND CORONARY ANGIOGRAPHY;  Surgeon: Burnell Blanks, MD;  Location: [facility];  Service: Cardiovascular;  Laterality: N/A;   PAROTIDECTOMY Right 1992   PROSTATE SURGERY  2002, 2004   [place]     SINUS EXPLORATION  2013   SPINE SURGERY  2013   L C7-T1 laminectomy and discectomy   TONSILLECTOMY     Patient Active Problem List   Diagnosis Date Noted   Carotid bruit 07/09/2021   Aneurysm of ascending aorta ([place]) 06/28/2020   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   CAD (coronary artery disease) 07/09/2018   CKD (chronic kidney disease), 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   Chest pain of uncertain etiology 67/34/1937   Chronic cough 06/21/2018   Thrombocytopenia ([place]) 12/01/2016   Transaminitis 90/24/0973   Diastolic dysfunction 53/29/9242   MDD (major depressive disorder), recurrent severe, without psychosis ([place]) 10/13/2015   Hyperlipidemia 08/24/2015   Arthritis    Depression    Hypertension    Aortic  insufficiency    Psoriasis    Cervical disc disease    Lumbar disc disease    Asthma 01/06/2015   Psoriatic arthritis ([place]) 01/06/2015   Allergic rhinoconjunctivitis 01/06/2015   GERD (gastroesophageal reflux disease) 01/06/2015   Laryngopharyngeal reflux (LPR) 01/06/2015    PCP: No PCP  REFERRING PROVIDER: Victorino December  REFERRING DIAG: Z47.89   THERAPY DIAG:  Acute pain of right knee  Localized edema  Stiffness of right knee, not elsewhere classified  Difficulty in walking, not elsewhere classified  Rationale for Evaluation and Treatment Rehabilitation  ONSET DATE: 12/26/21  SUBJECTIVE:   SUBJECTIVE STATEMENT: 7 week ago had my meniscus repaired, I am doing the stairs every day. I feel like I have been walking better. Using a RW and a cane on the stairs.   PERTINENT HISTORY: R meniscus repait post op 7 weeks  PAIN:  Are you having pain? Yes: NPRS scale: 3/10 Pain location: R knee Pain description: achy, throbbing  Aggravating factors: walking Relieving factors: ice, tylenol   PRECAUTIONS: None  WEIGHT BEARING RESTRICTIONS No  FALLS:  Has patient fallen in last 6 months? No  LIVING ENVIRONMENT: Lives with: lives with their spouse Lives in: House/apartment Stairs: Yes: Internal: 12 steps; on right going up Has following equipment at home: Single point cane and Walker - 2 wheeled  OCCUPATION: Retired  PLOF: Independent  PATIENT GOALS go out and play pickleball    OBJECTIVE:   PATIENT SURVEYS:  FOTO 35/100  COGNITION:  Overall cognitive status: Within functional limits for tasks assessed     SENSATION: WFL   MUSCLE LENGTH: Hamstrings: Right tightness limiting ROM   POSTURE: No Significant postural limitations and rounded shoulders  PALPATION: No TTP, some swelling in R knee   LOWER EXTREMITY ROM:  Active ROM Right eval Left eval  Hip flexion    Hip extension    Hip abduction    Hip adduction    Hip internal rotation    Hip  external rotation    Knee flexion 88   Knee extension -15   Ankle dorsiflexion    Ankle plantarflexion    Ankle inversion    Ankle eversion     (Blank rows = not tested)  LOWER EXTREMITY MMT:  MMT Right eval Left eval  Hip flexion 4   Hip extension    Hip abduction    Hip adduction    Hip internal rotation    Hip external rotation    Knee flexion 4   Knee extension 3+ w/pain   Ankle dorsiflexion    Ankle plantarflexion    Ankle inversion    Ankle eversion     (Blank rows = not tested)  FUNCTIONAL TESTS:  5 times sit to stand: 18.89s Timed up and go (TUG): 17.53s  GAIT: Distance walked: 62f Assistive device utilized: WEnvironmental consultant- 2 wheeled and able to do TUG without AD  Level of assistance: Modified independence Comments: decreased stance time on RLE, decreased step length, antalgic gait, decreased R knee flexion,     TODAY'S TREATMENT: Eval and POC   PATIENT EDUCATION:  Education details: POC Person educated: Patient Education method: Explanation Education comprehension: verbalized understanding   HOME EXERCISE PROGRAM: LAQ, SAQ, knee flexion seated- given from Home Health  ASSESSMENT:  CLINICAL IMPRESSION: Patient is a 71y.o. male who was seen today for physical therapy evaluation and treatment for R knee post meniscus repair. He was seeing home health PT and was discharged to start OP. He is doing well in regards to having some ROM and strength returns 7 weeks post operation. He is ambulating with a rolling walker, using a cane to navigate stairs, and sometimes walks without AD. He is full weight bearing as tolerated. We will see him back in the office in 6 weeks.Patient will benefit from skilled PT intervention to address R knee pain, ROM, and strength deficits to be able to return to PLOF and getting back to playing pickleball.    REHAB POTENTIAL: Good  CLINICAL DECISION MAKING: Stable/uncomplicated  EVALUATION COMPLEXITY: Low   GOALS: Goals  reviewed with patient? No  SHORT TERM GOALS: Target date: 03/12/22  Patient will be independent with initial HEP. Goal status: INITIAL   LONG TERM GOALS: Target date: 04/23/22  Patient will be independent with advanced/ongoing HEP to improve outcomes and carryover.  Goal status: INITIAL  2.  Patient will report at least 75% improvement in R knee pain to improve QOL. Baseline: 3/10 pain Goal status: INITIAL  3.  Patient will demonstrate improved R knee AROM to >/= 5-120 deg to allow for normal gait and stair mechanics. Baseline: 15-0-88 Goal status: INITIAL  4.  Patient will demonstrate improved functional RLE strength as demonstrated by 5/5. Goal status: INITIAL  5.  Patient will be able to ambulate 600' with normal gait pattern without increased pain  to access community.  Baseline: walking with RW Goal status: INITIAL  6. Patient will be able to ascend/descend stairs with 1 HR and reciprocal step pattern safely to access home and community.  Baseline: using cane to navigate stairs Goal status: INITIAL  7.  Patient will report 74 on FOTO to demonstrate improved functional ability. Baseline: 35 Goal status: INITIAL  PLAN: PT FREQUENCY: 2x/week  PT DURATION: 12 weeks  PLANNED INTERVENTIONS: Therapeutic exercises, Therapeutic activity, Neuromuscular re-education, Balance training, Gait training, Patient/Family education, Self Care, Joint mobilization, Stair training, Electrical stimulation, Cryotherapy, Taping, Vasopneumatic device, Ionotophoresis '4mg'$ /ml Dexamethasone, and Manual therapy  PLAN FOR NEXT SESSION: PROM, AAROM, light strengthening, vaso for pain management   Andris Baumann, PT 01/29/2022, 3:32 PM

## 2022-01-29 ENCOUNTER — Ambulatory Visit: Payer: Medicare Other | Attending: Orthopedic Surgery

## 2022-01-29 DIAGNOSIS — R600 Localized edema: Secondary | ICD-10-CM | POA: Insufficient documentation

## 2022-01-29 DIAGNOSIS — M25661 Stiffness of right knee, not elsewhere classified: Secondary | ICD-10-CM | POA: Insufficient documentation

## 2022-01-29 DIAGNOSIS — R262 Difficulty in walking, not elsewhere classified: Secondary | ICD-10-CM | POA: Insufficient documentation

## 2022-01-29 DIAGNOSIS — M25561 Pain in right knee: Secondary | ICD-10-CM | POA: Insufficient documentation

## 2022-02-04 ENCOUNTER — Telehealth: Payer: Self-pay | Admitting: Adult Health

## 2022-02-04 ENCOUNTER — Ambulatory Visit: Payer: Medicare Other | Admitting: Physical Therapy

## 2022-02-04 DIAGNOSIS — F39 Unspecified mood [affective] disorder: Secondary | ICD-10-CM

## 2022-02-04 NOTE — Telephone Encounter (Signed)
Pt called requesting Rx for Lamotrigine 200 mg 2/d #180 with 3 RF fax to [facility] fax # [phone]. Pt only has couple pills left.

## 2022-02-04 NOTE — Therapy (Signed)
OUTPATIENT PHYSICAL THERAPY LOWER EXTREMITY TREATMENT   Patient Name: Arthur Moore MRN: [id number] DOB:1951/04/24, 71 y.o., male Today's Date: 02/05/2022   PT End of Session - 02/05/22 1356     Visit Number 2    Date for PT Re-Evaluation 04/23/22    Progress Note Due on Visit 10    PT Start Time 1400    PT Stop Time 6759    PT Time Calculation (min) 45 min    Activity Tolerance Patient tolerated treatment well    Behavior During Therapy [facility] for tasks assessed/performed              Past Medical History:  Diagnosis Date   Aortic insufficiency    Arthritis    Asthma    Carotid bruit 07/09/2021   Cervical disc disease    Depression    Diastolic dysfunction 1/63/8466   Hyperlipidemia 08/24/2015   Hypertension    Lumbar disc disease    Psoriasis    Past Surgical History:  Procedure Laterality Date   CHOLECYSTECTOMY  2012   HERNIA REPAIR     LEFT HEART CATH AND CORONARY ANGIOGRAPHY N/A 07/08/2018   Procedure: LEFT HEART CATH AND CORONARY ANGIOGRAPHY;  Surgeon: Burnell Blanks, MD;  Location: [place] [facility];  Service: Cardiovascular;  Laterality: N/A;   PAROTIDECTOMY Right 1992   PROSTATE SURGERY  2002, 2004   [place]     SINUS EXPLORATION  2013   SPINE SURGERY  2013   L C7-T1 laminectomy and discectomy   TONSILLECTOMY     Patient Active Problem List   Diagnosis Date Noted   Carotid bruit 07/09/2021   Aneurysm of ascending aorta ([place]) 06/28/2020   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   CAD (coronary artery disease) 07/09/2018   CKD (chronic kidney disease), 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   Chest pain of uncertain etiology 59/93/5701   Chronic cough 06/21/2018   Thrombocytopenia ([place]) 12/01/2016   Transaminitis 77/93/9030   Diastolic dysfunction 01/18/3006   MDD (major depressive disorder), recurrent severe, without psychosis ([place]) 10/13/2015   Hyperlipidemia 08/24/2015   Arthritis    Depression    Hypertension    Aortic  insufficiency    Psoriasis    Cervical disc disease    Lumbar disc disease    Asthma 01/06/2015   Psoriatic arthritis ([place]) 01/06/2015   Allergic rhinoconjunctivitis 01/06/2015   GERD (gastroesophageal reflux disease) 01/06/2015   Laryngopharyngeal reflux (LPR) 01/06/2015    PCP: No PCP  REFERRING PROVIDER: Victorino December  REFERRING DIAG: Z47.89   THERAPY DIAG:  Acute pain of right knee  Localized edema  Stiffness of right knee, not elsewhere classified  Difficulty in walking, not elsewhere classified  Rationale for Evaluation and Treatment Rehabilitation  ONSET DATE: 12/26/21  SUBJECTIVE:   SUBJECTIVE STATEMENT: I am in pain today, I might have over did my exercises yesterday because it is swollen and tight today.   PERTINENT HISTORY: R meniscus repait post op 7 weeks  PAIN:  Are you having pain? Yes: NPRS scale: 7/10 Pain location: R knee Pain description: achy, throbbing  Aggravating factors: walking Relieving factors: ice, tylenol   PRECAUTIONS: None  WEIGHT BEARING RESTRICTIONS No  FALLS:  Has patient fallen in last 6 months? No  LIVING ENVIRONMENT: Lives with: lives with their spouse Lives in: House/apartment Stairs: Yes: Internal: 12 steps; on right going up Has following equipment at home: Single point cane and Walker - 2 wheeled  OCCUPATION: Retired  PLOF: [place] go  out and play pickleball    OBJECTIVE:   PATIENT SURVEYS:  FOTO 35/100  COGNITION:  Overall cognitive status: Within functional limits for tasks assessed     SENSATION: WFL   MUSCLE LENGTH: Hamstrings: Right tightness limiting ROM   POSTURE: No Significant postural limitations and rounded shoulders  PALPATION: No TTP, some swelling in R knee   LOWER EXTREMITY ROM:  Active ROM Right eval Left eval  Hip flexion    Hip extension    Hip abduction    Hip adduction    Hip internal rotation    Hip external rotation    Knee flexion 88   Knee  extension -15   Ankle dorsiflexion    Ankle plantarflexion    Ankle inversion    Ankle eversion     (Blank rows = not tested)  LOWER EXTREMITY MMT:  MMT Right eval Left eval  Hip flexion 4   Hip extension    Hip abduction    Hip adduction    Hip internal rotation    Hip external rotation    Knee flexion 4   Knee extension 3+ w/pain   Ankle dorsiflexion    Ankle plantarflexion    Ankle inversion    Ankle eversion     (Blank rows = not tested)  FUNCTIONAL TESTS:  5 times sit to stand: 18.89s Timed up and go (TUG): 17.53s  GAIT: Distance walked: 65f Assistive device utilized: WEnvironmental consultant- 2 wheeled and able to do TUG without AD  Level of assistance: Modified independence Comments: decreased stance time on RLE, decreased step length, antalgic gait, decreased R knee flexion,     TODAY'S TREATMENT: 02/05/22 NuStep L3 x620ms  PROM flexion, ext, and patellar mobs  AAROM knee slides 2x10  SAQ 2x10 SLR 2# 2x10  LAQ 5# x10, 10# x7 RLE only HS curls 15# x10, 20# x10  RLE only  Vaso to R knee    PATIENT EDUCATION:  Education details: POC Person educated: Patient Education method: Explanation Education comprehension: verbalized understanding   HOME EXERCISE PROGRAM: LAQ, SAQ, knee flexion seated- [place] [facility] [place] CLINICAL IMPRESSION: Patient returns with high pain levels in R knee and swelling. He tolerates session really well with no increase in pain. He handles all weights very well and requested to move up in weight. Still has quad weakness and will benefit from some weight shifting and gait training next visit.    REHAB POTENTIAL: Good  CLINICAL DECISION MAKING: Stable/uncomplicated  EVALUATION COMPLEXITY: Low   GOALS: Goals reviewed with patient? No  SHORT TERM GOALS: Target date: 03/12/22  Patient will be independent with initial HEP. Goal status: INITIAL   LONG TERM GOALS: Target date: 04/23/22  Patient will be independent  with advanced/ongoing HEP to improve outcomes and carryover.  Goal status: INITIAL  2.  Patient will report at least 75% improvement in R knee pain to improve QOL. Baseline: 3/10 pain Goal status: INITIAL  3.  Patient will demonstrate improved R knee AROM to >/= 5-120 deg to allow for normal gait and stair mechanics. Baseline: 15-0-88 Goal status: INITIAL  4.  Patient will demonstrate improved functional RLE strength as demonstrated by 5/5. Goal status: INITIAL  5.  Patient will be able to ambulate 600' with normal gait pattern without increased pain to access community.  Baseline: walking with RW Goal status: INITIAL  6. Patient will be able to ascend/descend stairs with 1 HR and reciprocal step pattern safely to access home and  community.  Baseline: using cane to navigate stairs Goal status: INITIAL  7.  Patient will report 76 on FOTO to demonstrate improved functional ability. Baseline: 35 Goal status: INITIAL  PLAN: PT FREQUENCY: 2x/week  PT DURATION: 12 weeks  PLANNED INTERVENTIONS: Therapeutic exercises, Therapeutic activity, Neuromuscular re-education, Balance training, Gait training, Patient/Family education, Self Care, Joint mobilization, Stair training, Electrical stimulation, Cryotherapy, Taping, Vasopneumatic device, Ionotophoresis '4mg'$ /ml Dexamethasone, and Manual therapy  PLAN FOR NEXT SESSION: weight shifts, walking w/o AD, STS, leg press, resisted gait    Andris Baumann, PT 02/05/2022, 2:46 PM

## 2022-02-04 NOTE — Telephone Encounter (Signed)
Patient has RF available. Is he going to follow with [facility] or at the [place]. LVM to [facility].

## 2022-02-05 ENCOUNTER — Telehealth: Payer: Self-pay

## 2022-02-05 ENCOUNTER — Other Ambulatory Visit: Payer: Self-pay

## 2022-02-05 ENCOUNTER — Ambulatory Visit: Payer: Medicare Other

## 2022-02-05 DIAGNOSIS — R262 Difficulty in walking, not elsewhere classified: Secondary | ICD-10-CM

## 2022-02-05 DIAGNOSIS — M25661 Stiffness of right knee, not elsewhere classified: Secondary | ICD-10-CM | POA: Diagnosis not present

## 2022-02-05 DIAGNOSIS — M25561 Pain in right knee: Secondary | ICD-10-CM | POA: Diagnosis not present

## 2022-02-05 DIAGNOSIS — F39 Unspecified mood [affective] disorder: Secondary | ICD-10-CM

## 2022-02-05 DIAGNOSIS — R600 Localized edema: Secondary | ICD-10-CM | POA: Diagnosis not present

## 2022-02-05 MED ORDER — LAMOTRIGINE 200 MG PO TABS: 200.0000 mg | ORAL_TABLET | Freq: Two times a day (BID) | ORAL | 3 refills | Status: DC

## 2022-02-05 MED ORDER — LAMOTRIGINE 200 MG PO TABS: 200.0000 mg | ORAL_TABLET | Freq: Two times a day (BID) | ORAL | 3 refills | Status: AC

## 2022-02-05 NOTE — Addendum Note (Signed)
Addended by: Bonney Leitz T on: 02/05/2022 10:34 AM   Modules accepted: Orders

## 2022-02-05 NOTE — Telephone Encounter (Signed)
An Rx was sent electronically. A printed Rx was faxed to the requested number.

## 2022-02-05 NOTE — Telephone Encounter (Signed)
Lvm for pt to call back to schedule °

## 2022-02-06 ENCOUNTER — Ambulatory Visit
Admission: RE | Admit: 2022-02-06 | Discharge: 2022-02-06 | Disposition: A | Discharge status: Home / Self Care | Payer: Medicare Other | Source: Ambulatory Visit | Attending: Gastroenterology | Admitting: Gastroenterology

## 2022-02-06 ENCOUNTER — Other Ambulatory Visit: Payer: Self-pay

## 2022-02-06 ENCOUNTER — Ambulatory Visit: Payer: Medicare Other | Admitting: Physical Therapy

## 2022-02-06 DIAGNOSIS — K745 Biliary cirrhosis, unspecified: Secondary | ICD-10-CM | POA: Diagnosis not present

## 2022-02-06 DIAGNOSIS — F39 Unspecified mood [affective] disorder: Secondary | ICD-10-CM

## 2022-02-06 DIAGNOSIS — K862 Cyst of pancreas: Secondary | ICD-10-CM

## 2022-02-06 DIAGNOSIS — R519 Headache, unspecified: Secondary | ICD-10-CM | POA: Diagnosis not present

## 2022-02-06 DIAGNOSIS — K7460 Unspecified cirrhosis of liver: Secondary | ICD-10-CM

## 2022-02-06 MED ORDER — GADOPICLENOL 0.5 MMOL/ML IV SOLN
9.0000 mL | Freq: Once | INTRAVENOUS | Status: AC | PRN
  Administered 2022-02-06: 9 mL via INTRAVENOUS

## 2022-02-06 NOTE — Therapy (Signed)
OUTPATIENT PHYSICAL THERAPY LOWER EXTREMITY TREATMENT   Patient Name: Arthur Moore MRN: [id number] DOB:1950/11/12, 71 y.o., male Today's Date: 02/07/2022   PT End of Session - 02/07/22 0845     Visit Number 3    Date for PT Re-Evaluation 04/23/22    Progress Note Due on Visit 10    PT Start Time 0845    PT Stop Time 0930    PT Time Calculation (min) 45 min    Activity Tolerance Patient tolerated treatment well    Behavior During Therapy [facility] for tasks assessed/performed               Past Medical History:  Diagnosis Date   Aortic insufficiency    Arthritis    Asthma    Carotid bruit 07/09/2021   Cervical disc disease    Depression    Diastolic dysfunction 2/70/6237   Hyperlipidemia 08/24/2015   Hypertension    Lumbar disc disease    Psoriasis    Past Surgical History:  Procedure Laterality Date   CHOLECYSTECTOMY  2012   HERNIA REPAIR     LEFT HEART CATH AND CORONARY ANGIOGRAPHY N/A 07/08/2018   Procedure: LEFT HEART CATH AND CORONARY ANGIOGRAPHY;  Surgeon: Burnell Blanks, MD;  Location: [place] [facility];  Service: Cardiovascular;  Laterality: N/A;   PAROTIDECTOMY Right 1992   PROSTATE SURGERY  2002, 2004   [place]     SINUS EXPLORATION  2013   SPINE SURGERY  2013   L C7-T1 laminectomy and discectomy   TONSILLECTOMY     Patient Active Problem List   Diagnosis Date Noted   Carotid bruit 07/09/2021   Aneurysm of ascending aorta ([place]) 06/28/2020   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   CAD (coronary artery disease) 07/09/2018   CKD (chronic kidney disease), 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   Chest pain of uncertain etiology 62/83/1517   Chronic cough 06/21/2018   Thrombocytopenia ([place]) 12/01/2016   Transaminitis 61/60/7371   Diastolic dysfunction 10/22/9483   MDD (major depressive disorder), recurrent severe, without psychosis ([place]) 10/13/2015   Hyperlipidemia 08/24/2015   Arthritis    Depression    Hypertension     Aortic insufficiency    Psoriasis    Cervical disc disease    Lumbar disc disease    Asthma 01/06/2015   Psoriatic arthritis ([place]) 01/06/2015   Allergic rhinoconjunctivitis 01/06/2015   GERD (gastroesophageal reflux disease) 01/06/2015   Laryngopharyngeal reflux (LPR) 01/06/2015    PCP: No PCP  REFERRING PROVIDER: Victorino December  REFERRING DIAG: Z47.89   THERAPY DIAG:  Acute pain of right knee  Localized edema  Stiffness of right knee, not elsewhere classified  Difficulty in walking, not elsewhere classified  Rationale for Evaluation and Treatment Rehabilitation  ONSET DATE: 12/26/21  SUBJECTIVE:   SUBJECTIVE STATEMENT: It is a little achy today, 3/10.   PERTINENT HISTORY: R meniscus repait post op 7 weeks  PAIN:  Are you having pain? Yes: NPRS scale: 3/10 Pain location: R knee Pain description: achy, throbbing  Aggravating factors: walking Relieving factors: ice, tylenol   PRECAUTIONS: None  WEIGHT BEARING RESTRICTIONS No  FALLS:  Has patient fallen in last 6 months? No  LIVING ENVIRONMENT: Lives with: lives with their spouse Lives in: House/apartment Stairs: Yes: Internal: 12 steps; on right going up Has following equipment at home: Single point cane and Walker - 2 wheeled  OCCUPATION: Retired  PLOF: Independent  PATIENT GOALS go out and play pickleball    OBJECTIVE:   PATIENT SURVEYS:  FOTO 35/100  COGNITION:  Overall cognitive status: Within functional limits for tasks assessed     SENSATION: WFL   MUSCLE LENGTH: Hamstrings: Right tightness limiting ROM   POSTURE: No Significant postural limitations and rounded shoulders  PALPATION: No TTP, some swelling in R knee   LOWER EXTREMITY ROM:  Active ROM Right eval 02/07/22  Hip flexion    Hip extension    Hip abduction    Hip adduction    Hip internal rotation    Hip external rotation    Knee flexion 88 105  Knee extension -15 -10  Ankle dorsiflexion    Ankle plantarflexion     Ankle inversion    Ankle eversion     (Blank rows = not tested)  LOWER EXTREMITY MMT:  MMT Right eval Left eval  Hip flexion 4   Hip extension    Hip abduction    Hip adduction    Hip internal rotation    Hip external rotation    Knee flexion 4   Knee extension 3+ w/pain   Ankle dorsiflexion    Ankle plantarflexion    Ankle inversion    Ankle eversion     (Blank rows = not tested)  FUNCTIONAL TESTS:  5 times sit to stand: 18.89s Timed up and go (TUG): 17.53s  GAIT: Distance walked: 37f Assistive device utilized: WEnvironmental consultant- 2 wheeled and able to do TUG without AD  Level of assistance: Modified independence Comments: decreased stance time on RLE, decreased step length, antalgic gait, decreased R knee flexion,     TODAY'S TREATMENT: 02/07/22 Bike L2 x513ms (seat 14) Weight shifts side to side and forwards and back x10 each  Calf raises on bar 2x12 Walking w/o AD 2 laps  Leg press 40# x10, x10 eccentric, RLE only 20# x10  Resisted gait 40# 3 way x4 STS 2x10 Leg ext 5# RLE only 2x10 HS curls 20# RLE only 2x10  Vaso to R knee 29F, high pressure 1035m   02/05/22 NuStep L3 x6mi12m PROM flexion, ext, and patellar mobs  AAROM knee slides 2x10  SAQ 2x10 SLR 2# 2x10  LAQ 5# x10, 10# x7 RLE only HS curls 15# x10, 20# x10  RLE only  Vaso to R knee    PATIENT EDUCATION:  Education details: POC Person educated: Patient Education method: Explanation Education comprehension: verbalized understanding   HOME EXERCISE PROGRAM: LAQ, SAQ, knee flexion seated- [facility] IMPRESSION: Patient continues to make good progress with R knee, still missing some range and has most difficulty with extension. He was able to ride the bike today for 5 mins. Most difficulty today with resisted gait and leg press. Vaso at end of session to help with swelling and pain.    REHAB POTENTIAL: Good  CLINICAL DECISION MAKING:  Stable/uncomplicated  EVALUATION COMPLEXITY: Low   GOALS: Goals reviewed with patient? No  SHORT TERM GOALS: Target date: 03/12/22  Patient will be independent with initial HEP. Goal status: INITIAL   LONG TERM GOALS: Target date: 04/23/22  Patient will be independent with advanced/ongoing HEP to improve outcomes and carryover.  Goal status: INITIAL  2.  Patient will report at least 75% improvement in R knee pain to improve QOL. Baseline: 3/10 pain Goal status: INITIAL  3.  Patient will demonstrate improved R knee AROM to >/= 5-120 deg to allow for normal gait and stair mechanics. Baseline: 15-0-88 Goal status: INITIAL  4.  Patient will demonstrate improved functional RLE  strength as demonstrated by 5/5. Goal status: INITIAL  5.  Patient will be able to ambulate 600' with normal gait pattern without increased pain to access community.  Baseline: walking with RW Goal status: INITIAL  6. Patient will be able to ascend/descend stairs with 1 HR and reciprocal step pattern safely to access home and community.  Baseline: using cane to navigate stairs Goal status: INITIAL  7.  Patient will report 74 on FOTO to demonstrate improved functional ability. Baseline: 35 Goal status: INITIAL  PLAN: PT FREQUENCY: 2x/week  PT DURATION: 12 weeks  PLANNED INTERVENTIONS: Therapeutic exercises, Therapeutic activity, Neuromuscular re-education, Balance training, Gait training, Patient/Family education, Self Care, Joint mobilization, Stair training, Electrical stimulation, Cryotherapy, Taping, Vasopneumatic device, Ionotophoresis '4mg'$ /ml Dexamethasone, and Manual therapy  PLAN FOR NEXT SESSION: weight shifts, walking w/o AD, STS, leg press, resisted gait    Andris Baumann, PT 02/07/2022, 9:31 AM

## 2022-02-07 ENCOUNTER — Ambulatory Visit (INDEPENDENT_AMBULATORY_CARE_PROVIDER_SITE_OTHER): Payer: Medicare Other | Admitting: Adult Health

## 2022-02-07 ENCOUNTER — Encounter (HOSPITAL_BASED_OUTPATIENT_CLINIC_OR_DEPARTMENT_OTHER): Payer: Self-pay | Admitting: Cardiovascular Disease

## 2022-02-07 ENCOUNTER — Encounter: Payer: Self-pay | Admitting: Adult Health

## 2022-02-07 ENCOUNTER — Other Ambulatory Visit: Payer: Self-pay | Admitting: Family Medicine

## 2022-02-07 ENCOUNTER — Ambulatory Visit: Payer: Medicare Other

## 2022-02-07 DIAGNOSIS — R600 Localized edema: Secondary | ICD-10-CM | POA: Diagnosis not present

## 2022-02-07 DIAGNOSIS — M25561 Pain in right knee: Secondary | ICD-10-CM

## 2022-02-07 DIAGNOSIS — R519 Headache, unspecified: Secondary | ICD-10-CM

## 2022-02-07 DIAGNOSIS — F4310 Post-traumatic stress disorder, unspecified: Secondary | ICD-10-CM

## 2022-02-07 DIAGNOSIS — F331 Major depressive disorder, recurrent, moderate: Secondary | ICD-10-CM | POA: Diagnosis not present

## 2022-02-07 DIAGNOSIS — M25661 Stiffness of right knee, not elsewhere classified: Secondary | ICD-10-CM | POA: Diagnosis not present

## 2022-02-07 DIAGNOSIS — F39 Unspecified mood [affective] disorder: Secondary | ICD-10-CM

## 2022-02-07 DIAGNOSIS — F411 Generalized anxiety disorder: Secondary | ICD-10-CM | POA: Diagnosis not present

## 2022-02-07 DIAGNOSIS — R262 Difficulty in walking, not elsewhere classified: Secondary | ICD-10-CM | POA: Diagnosis not present

## 2022-02-07 MED ORDER — LORAZEPAM 0.5 MG PO TABS: 0.5000 mg | ORAL_TABLET | Freq: Three times a day (TID) | ORAL | 2 refills | Status: DC | PRN

## 2022-02-07 NOTE — Progress Notes (Signed)
Arthur Moore [id number] 18-Jan-1951 71 y.o.  Subjective:   Patient ID:  Arthur Moore is a 71 y.o. (DOB May 10, 1950) male.  Chief Complaint: No chief complaint on file.   HPI Arthur Moore presents to the office today for follow-up of Episodic Mood Disorder, PTSD, depression and GAD.  Accompanied by wife.  Describes mood today as "ok". Pleasant. Denies tearfulness. Reports some depression - not able to do the things he normally does - recovering from surgery. Reports irritability at times. Reports increased anxiety at times - using Lorazepam as needed. Denies recent panic attacks. Mood is consistent. Stating "I feel like I'm doing alright". Feels. like medications are helpful. Working with therapist Arthur Moore. Varying interest and motivation. Taking medications as prescribed.  Energy levels stable. Active, unable to exercise with leg injury. Enjoys some usual interests and activities. Married. Lives with wife - 4 children between them. Spending time with family. Appetite stable. Weight stable - 188 pounds. Sleeps well at night. Averages 9 hours.  Focus and concentration "pretty good". Completing tasks. Managing aspects of household. Retired - [place] veteran. Denies SI or HI.  Denies AH or VH.  Previous medication trials: Lexapro, Cymbalta, Xanax, Lamictal, Wellbutrin, Effexor, Paxil, Abilify, Pristiq, Vraylar,   PHQ2-9    Flowsheet Row [facility]  PHQ-2 Total Score 0      Flowsheet Row ED from 11/12/2021 in [facility] from 04/14/2021 in [facility] from 12/07/2020 in [place] [facility] No Risk No Risk No Risk        Review of Systems:  Review of Systems  Musculoskeletal:  Negative for gait problem.  Neurological:  Negative for tremors.  Psychiatric/Behavioral:         Please refer to HPI     Medications: I have reviewed the patient's current medications.  Current Outpatient Medications  Medication Sig Dispense Refill   albuterol (VENTOLIN HFA) 108 (90 Base) MCG/ACT inhaler Inhale 2 puffs into the lungs every 4 (four) hours as needed for wheezing or shortness of breath. 18 each 1   aspirin EC 81 MG tablet Take 81 mg by mouth daily.     budesonide (RHINOCORT AQUA) 32 MCG/ACT nasal spray Use two sprays in each nostril once daily 8.43 mL 5   budesonide-formoterol (SYMBICORT) 160-4.5 MCG/ACT inhaler Inhale 2 puffs into the lungs 2 (two) times daily. 1 each 1   busPIRone (BUSPAR) 15 MG tablet Take 1 tablet (15 mg total) by mouth 3 (three) times daily. (Patient taking differently: Take 15 mg by mouth daily.) 270 tablet 3   cetirizine (ZYRTEC ALLERGY) 10 MG tablet Take 1 tablet (10 mg total) by mouth daily as needed (can take an ectra dose during flare ups). 60 tablet 5   Cholecalciferol (VITAMIN D3) 5000 units TABS Take 5,000 Units by mouth daily.      fluticasone (FLONASE) 50 MCG/ACT nasal spray 1-2 sprays each nostril 1 (ONE) time per day. 16 g 2   lamoTRIgine (LAMICTAL) 200 MG tablet Take 1 tablet (200 mg total) by mouth 2 (two) times daily. 180 tablet 3   LORazepam (ATIVAN) 0.5 MG tablet Take 1 tablet (0.5 mg total) by mouth 3 (three) times daily as needed for anxiety. 90 tablet 2   metoprolol tartrate (LOPRESSOR) 25 MG tablet TAKE 1/2 TABLET BY MOUTH TWICE A DAY 90 tablet 3   Multiple Vitamin (MULTIVITAMIN) capsule Take 1 capsule by mouth daily.  nitroGLYCERIN (NITROSTAT) 0.4 MG SL tablet Place 1 tablet (0.4 mg total) under the tongue every 5 (five) minutes as needed for chest pain. 25 tablet 3   pantoprazole (PROTONIX) 40 MG tablet Take 1 tablet by mouth 1-2 times per day. 180 tablet 1   Probiotic Product (PROBIOTIC DAILY PO) Take 1 tablet by mouth daily.     rosuvastatin (CRESTOR) 10 MG tablet Take 1 tablet by mouth daily.     zinc gluconate 50 MG tablet Take 1 tablet (50 mg  total) by mouth daily.     No current facility-administered medications for this visit.    Medication Side Effects: None  Allergies:  Allergies  Allergen Reactions   Atorvastatin Other (See Comments)    Calf pain. Other reaction(s): leb numbness   Zolpidem Other (See Comments)    Other reaction(s): Drowsy    Past Medical History:  Diagnosis Date   Aortic insufficiency    Arthritis    Asthma    Carotid bruit 07/09/2021   Cervical disc disease    Depression    Diastolic dysfunction 12/20/537   Hyperlipidemia 08/24/2015   Hypertension    Lumbar disc disease    Psoriasis     Past Medical History, Surgical history, Social history, and Family history were reviewed and updated as appropriate.   Please see review of systems for further details on the patient's review from today.   Objective:   Physical Exam:  There were no vitals taken for this visit.  Physical Exam Constitutional:      General: He is not in acute distress. Musculoskeletal:        General: No deformity.  Neurological:     Mental Status: He is alert and oriented to person, place, and time.     Coordination: Coordination normal.  Psychiatric:        Attention and Perception: Attention and perception normal. He does not perceive auditory or visual hallucinations.        Mood and Affect: Mood normal. Mood is not anxious or depressed. Affect is not labile, blunt, angry or inappropriate.        Speech: Speech normal.        Behavior: Behavior normal.        Thought Content: Thought content normal. Thought content is not paranoid or delusional. Thought content does not include homicidal or suicidal ideation. Thought content does not include homicidal or suicidal plan.        Cognition and Memory: Cognition and memory normal.        Judgment: Judgment normal.     Comments: Insight intact     Lab Review:     Component Value Date/Time   NA 139 11/12/2021 2032   NA 144 10/18/2020 1130   K 3.8 11/12/2021  2032   CL 109 11/12/2021 2032   CO2 22 11/12/2021 2032   GLUCOSE 167 (H) 11/12/2021 2032   BUN 26 (H) 11/12/2021 2032   BUN 20 10/18/2020 1130   CREATININE 1.36 (H) 11/12/2021 2032   CALCIUM 8.9 11/12/2021 2032   PROT 6.6 10/18/2020 1130   ALBUMIN 4.7 10/18/2020 1130   AST 20 10/18/2020 1130   ALT 21 10/18/2020 1130   ALKPHOS 89 10/18/2020 1130   BILITOT 0.7 10/18/2020 1130   GFRNONAA 56 (L) 11/12/2021 2032   GFRAA >60 01/01/2020 0324       Component Value Date/Time   WBC 9.5 11/12/2021 2032   RBC 4.87 11/12/2021 2032   HGB 15.1 11/12/2021 2032  HGB 16.7 12/09/2016 1537   HCT 42.5 11/12/2021 2032   HCT 46.9 12/09/2016 1537   PLT 135 (L) 11/12/2021 2032   PLT 141 12/09/2016 1537   MCV 87.3 11/12/2021 2032   MCV 87.3 12/09/2016 1537   MCH 31.0 11/12/2021 2032   MCHC 35.5 11/12/2021 2032   RDW 12.7 11/12/2021 2032   RDW 13.6 12/09/2016 1537   LYMPHSABS 2.3 11/08/2020 1350   LYMPHSABS 1.5 12/09/2016 1537   MONOABS 0.6 11/08/2020 1350   MONOABS 0.4 12/09/2016 1537   EOSABS 0.4 11/08/2020 1350   EOSABS 0.3 12/09/2016 1537   BASOSABS 0.0 11/08/2020 1350   BASOSABS 0.0 12/09/2016 1537    No results found for: "POCLITH", "LITHIUM"   No results found for: "PHENYTOIN", "PHENOBARB", "VALPROATE", "CBMZ"   .res Assessment: Plan:    Plan:  PDMP reviewed  Lamictal '200mg'$  twice daily Lorazepam 0.'5mg'$  BID to TID  D/C Buspar '15mg'$  daily - discussed taper  Seeing therapist.  Plans to follow up with PCP for symptoms as well  RTC 4 weeks  Patient advised to contact office with any questions, adverse effects, or acute worsening in signs and symptoms.  Counseled patient regarding potential benefits, risks, and side effects of Lamictal to include potential risk of Stevens-Johnson syndrome. Advised patient to stop taking Lamictal and contact office immediately if rash develops and to seek urgent medical attention if rash is severe and/or spreading quickly.     Diagnoses  and all orders for this visit:  Major depressive disorder, recurrent episode, moderate (HCC)  GAD (generalized anxiety disorder) -     LORazepam (ATIVAN) 0.5 MG tablet; Take 1 tablet (0.5 mg total) by mouth 3 (three) times daily as needed for anxiety.  Episodic mood disorder (HCC)  Posttraumatic stress disorder with delayed expression     Please see After Visit Summary for patient specific instructions.  Future Appointments  Date Time Provider [place]  02/10/2022  8:45 AM Sumner Boast, PT [facility]  02/12/2022  9:30 AM Sumner Boast, PT [facility]  02/12/2022  2:30 PM Martyn Ehrich, NP [facility] None  02/17/2022  8:45 AM Sumner Boast, PT [facility]  02/19/2022  8:45 AM Sumner Boast, PT [facility]  02/24/2022  8:45 AM Sumner Boast, PT [facility]  02/26/2022  9:30 AM Sumner Boast, PT [facility]  03/03/2022  9:30 AM Sumner Boast, PT [facility]  03/05/2022  9:30 AM Sumner Boast, PT [facility]  03/10/2022  2:00 PM Sumner Boast, PT [facility]  03/12/2022  2:00 PM Sumner Boast, PT [facility]  03/17/2022  2:00 PM Sumner Boast, PT [facility]  03/19/2022  2:00 PM Sumner Boast, PT [facility]  03/24/2022  2:00 PM Sumner Boast, PT [facility]  03/26/2022  2:00 PM Sumner Boast, PT [facility]  03/31/2022  2:00 PM Sumner Boast, PT [facility]  04/02/2022  2:00 PM Riki Sheer Sharlene Motts, PT [facility]    No orders of the defined types were placed in this encounter.   -------------------------------

## 2022-02-10 ENCOUNTER — Encounter: Payer: Self-pay | Admitting: Physical Therapy

## 2022-02-10 ENCOUNTER — Ambulatory Visit: Payer: Medicare Other | Admitting: Physical Therapy

## 2022-02-10 DIAGNOSIS — M25661 Stiffness of right knee, not elsewhere classified: Secondary | ICD-10-CM | POA: Diagnosis not present

## 2022-02-10 DIAGNOSIS — R262 Difficulty in walking, not elsewhere classified: Secondary | ICD-10-CM | POA: Diagnosis not present

## 2022-02-10 DIAGNOSIS — R600 Localized edema: Secondary | ICD-10-CM | POA: Diagnosis not present

## 2022-02-10 DIAGNOSIS — M25561 Pain in right knee: Secondary | ICD-10-CM | POA: Diagnosis not present

## 2022-02-10 NOTE — Therapy (Signed)
OUTPATIENT PHYSICAL THERAPY LOWER EXTREMITY TREATMENT   Patient Name: Arthur Moore MRN: [id number] DOB:Nov 12, 1950, 71 y.o., male Today's Date: 02/10/2022   PT End of Session - 02/10/22 0844     Visit Number 4    Date for PT Re-Evaluation 04/23/22    Authorization Type Medicare    PT Start Time 0840    PT Stop Time 0933    PT Time Calculation (min) 53 min    Activity Tolerance Patient tolerated treatment well    Behavior During Therapy [facility] for tasks assessed/performed               Past Medical History:  Diagnosis Date   Aortic insufficiency    Arthritis    Asthma    Carotid bruit 07/09/2021   Cervical disc disease    Depression    Diastolic dysfunction 08/22/8339   Hyperlipidemia 08/24/2015   Hypertension    Lumbar disc disease    Psoriasis    Past Surgical History:  Procedure Laterality Date   CHOLECYSTECTOMY  2012   HERNIA REPAIR     LEFT HEART CATH AND CORONARY ANGIOGRAPHY N/A 07/08/2018   Procedure: LEFT HEART CATH AND CORONARY ANGIOGRAPHY;  Surgeon: Burnell Blanks, MD;  Location: [place];  Service: Cardiovascular;  Laterality: N/A;   PAROTIDECTOMY Right 1992   PROSTATE SURGERY  2002, 2004   [place]     SINUS EXPLORATION  2013   SPINE SURGERY  2013   L C7-T1 laminectomy and discectomy   TONSILLECTOMY     Patient Active Problem List   Diagnosis Date Noted   Carotid bruit 07/09/2021   Aneurysm of ascending aorta ([place]) 06/28/2020   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   CAD (coronary artery disease) 07/09/2018   CKD (chronic kidney disease), 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   Chest pain of uncertain etiology 96/22/2979   Chronic cough 06/21/2018   Thrombocytopenia ([place]) 12/01/2016   Transaminitis 89/21/1941   Diastolic dysfunction 74/11/1446   MDD (major depressive disorder), recurrent severe, without psychosis ([place]) 10/13/2015   Hyperlipidemia 08/24/2015   Arthritis    Depression    Hypertension    Aortic  insufficiency    Psoriasis    Cervical disc disease    Lumbar disc disease    Asthma 01/06/2015   Psoriatic arthritis ([place]) 01/06/2015   Allergic rhinoconjunctivitis 01/06/2015   GERD (gastroesophageal reflux disease) 01/06/2015   Laryngopharyngeal reflux (LPR) 01/06/2015    PCP: No PCP  REFERRING PROVIDER: Victorino December  REFERRING DIAG: Z47.89   THERAPY DIAG:  Acute pain of right knee  Localized edema  Stiffness of right knee, not elsewhere classified  Difficulty in walking, not elsewhere classified  Rationale for Evaluation and Treatment Rehabilitation  ONSET DATE: 12/26/21  SUBJECTIVE:   SUBJECTIVE STATEMENT: I am doing better, questions about walking without the cane and with stairs  PERTINENT HISTORY: R meniscus repait post op 7 weeks  PAIN:  Are you having pain? Yes: NPRS scale: 3/10 Pain location: R knee Pain description: achy, throbbing  Aggravating factors: walking Relieving factors: ice, tylenol   PRECAUTIONS: None  WEIGHT BEARING RESTRICTIONS No  FALLS:  Has patient fallen in last 6 months? No  LIVING ENVIRONMENT: Lives with: lives with their spouse Lives in: House/apartment Stairs: Yes: Internal: 12 steps; on right going up Has following equipment at home: Single point cane and Walker - 2 wheeled  OCCUPATION: Retired  PLOF: Independent  PATIENT GOALS go out and play pickleball    OBJECTIVE:  PATIENT SURVEYS:  FOTO 35/100  COGNITION:  Overall cognitive status: Within functional limits for tasks assessed     SENSATION: WFL   MUSCLE LENGTH: Hamstrings: Right tightness limiting ROM   POSTURE: No Significant postural limitations and rounded shoulders  PALPATION: No TTP, some swelling in R knee   LOWER EXTREMITY ROM:  Active ROM Right eval 02/07/22  Hip flexion    Hip extension    Hip abduction    Hip adduction    Hip internal rotation    Hip external rotation    Knee flexion 88 105  Knee extension -15 -10  Ankle  dorsiflexion    Ankle plantarflexion    Ankle inversion    Ankle eversion     (Blank rows = not tested)  LOWER EXTREMITY MMT:  MMT Right eval Left eval  Hip flexion 4   Hip extension    Hip abduction    Hip adduction    Hip internal rotation    Hip external rotation    Knee flexion 4   Knee extension 3+ w/pain   Ankle dorsiflexion    Ankle plantarflexion    Ankle inversion    Ankle eversion     (Blank rows = not tested)  FUNCTIONAL TESTS:  5 times sit to stand: 18.89s Timed up and go (TUG): 17.53s  GAIT: Distance walked: 85f Assistive device utilized: WEnvironmental consultant- 2 wheeled and able to do TUG without AD  Level of assistance: Modified independence Comments: decreased stance time on RLE, decreased step length, antalgic gait, decreased R knee flexion,     TODAY'S TREATMENT: 02/10/22 Bike Level 2 x 6 minutes (seat 13) Leg curls 20# 2x12 5# leg extension cues for TKE 2x12 Leg press 20# small ROM right leg only 3x10 Gait outside no device then full flight of stairs up and down step over step Resisted gait 40# all directions On airex marching, cone toe touch and volleyball SAQ with cues for TKE with 2.5# Passive stretch into extension Vaso right knee high pressure 34 degrees F   02/07/22 Bike L2 x586ms (seat 14) Weight shifts side to side and forwards and back x10 each  Calf raises on bar 2x12 Walking w/o AD 2 laps  Leg press 40# x10, x10 eccentric, RLE only 20# x10  Resisted gait 40# 3 way x4 STS 2x10 Leg ext 5# RLE only 2x10 HS curls 20# RLE only 2x10  Vaso to R knee 30F, high pressure 1053m   02/05/22 NuStep L3 x6mi31m PROM flexion, ext, and patellar mobs  AAROM knee slides 2x10  SAQ 2x10 SLR 2# 2x10  LAQ 5# x10, 10# x7 RLE only HS curls 15# x10, 20# x10  RLE only  Vaso to R knee    PATIENT EDUCATION:  Education details: POC Person educated: Patient Education method: Explanation Education comprehension: verbalized understanding   HOME  EXERCISE PROGRAM: LAQ, SAQ, knee flexion seated- given from HomeGalvaLINICAL IMPRESSION: Patient has a limitation in knee extension, could be VMO weakness vs tight calves and HS, on the calve stretch he reported really feeling it.  WE did stairs step over step without much difficulty.  Some difficulty going down slope and down stairs but really did well  REHAB POTENTIAL: Good  CLINICAL DECISION MAKING: Stable/uncomplicated  EVALUATION COMPLEXITY: Low   GOALS: Goals reviewed with patient? No  SHORT TERM GOALS: Target date: 03/12/22  Patient will be independent with initial HEP. Goal status:met   LONG TERM GOALS: Target date: 04/23/22  Patient will be independent with advanced/ongoing HEP to improve outcomes and carryover.  Goal status: ongoing  2.  Patient will report at least 75% improvement in R knee pain to improve QOL. Baseline: 3/10 pain Goal status: partially met  3.  Patient will demonstrate improved R knee AROM to >/= 5-120 deg to allow for normal gait and stair mechanics. Baseline: 15-0-88 Goal status: INITIAL  4.  Patient will demonstrate improved functional RLE strength as demonstrated by 5/5. Goal status: INITIAL  5.  Patient will be able to ambulate 600' with normal gait pattern without increased pain to access community.  Baseline: walking with RW Goal status: INITIAL  6. Patient will be able to ascend/descend stairs with 1 HR and reciprocal step pattern safely to access home and community.  Baseline: using cane to navigate stairs Goal status: INITIAL  7.  Patient will report 36 on FOTO to demonstrate improved functional ability. Baseline: 35 Goal status: INITIAL  PLAN: PT FREQUENCY: 2x/week  PT DURATION: 12 weeks  PLANNED INTERVENTIONS: Therapeutic exercises, Therapeutic activity, Neuromuscular re-education, Balance training, Gait training, Patient/Family education, Self Care, Joint mobilization, Stair training, Electrical  stimulation, Cryotherapy, Taping, Vasopneumatic device, Ionotophoresis 17m/ml Dexamethasone, and Manual therapy  PLAN FOR NEXT SESSION: weight shifts, walking w/o AD, STS, leg press, resisted gait    Faolan Springfield W, PT 02/10/2022, 8:45 AM

## 2022-02-12 ENCOUNTER — Encounter: Payer: Self-pay | Admitting: Physical Therapy

## 2022-02-12 ENCOUNTER — Encounter: Payer: Self-pay | Admitting: Primary Care

## 2022-02-12 ENCOUNTER — Ambulatory Visit: Payer: Medicare Other | Admitting: Physical Therapy

## 2022-02-12 ENCOUNTER — Ambulatory Visit (INDEPENDENT_AMBULATORY_CARE_PROVIDER_SITE_OTHER): Payer: Medicare Other | Admitting: Primary Care

## 2022-02-12 VITALS — BP 110/62 | HR 76 | Temp 97.9°F | Ht 72.0 in | Wt 194.2 lb

## 2022-02-12 DIAGNOSIS — M25561 Pain in right knee: Secondary | ICD-10-CM | POA: Diagnosis not present

## 2022-02-12 DIAGNOSIS — G4730 Sleep apnea, unspecified: Secondary | ICD-10-CM | POA: Diagnosis not present

## 2022-02-12 DIAGNOSIS — M25661 Stiffness of right knee, not elsewhere classified: Secondary | ICD-10-CM

## 2022-02-12 DIAGNOSIS — I2510 Atherosclerotic heart disease of native coronary artery without angina pectoris: Secondary | ICD-10-CM | POA: Diagnosis not present

## 2022-02-12 DIAGNOSIS — R0683 Snoring: Secondary | ICD-10-CM

## 2022-02-12 DIAGNOSIS — G4733 Obstructive sleep apnea (adult) (pediatric): Secondary | ICD-10-CM

## 2022-02-12 DIAGNOSIS — R262 Difficulty in walking, not elsewhere classified: Secondary | ICD-10-CM | POA: Diagnosis not present

## 2022-02-12 DIAGNOSIS — J4520 Mild intermittent asthma, uncomplicated: Secondary | ICD-10-CM

## 2022-02-12 DIAGNOSIS — R600 Localized edema: Secondary | ICD-10-CM | POA: Diagnosis not present

## 2022-02-12 NOTE — Assessment & Plan Note (Addendum)
-   Dx as young adult. No active respiratory symptoms. Hx possible agent orange exposure and MXT use. CXR in July 2023 showed clear lungs. Continue Symbicort 165mg two puffs twice daily.

## 2022-02-12 NOTE — Progress Notes (Signed)
$'@Patient'O$  ID: Arthur Moore, male    DOB: Jul 05, 1950, 71 y.o.   MRN: [id number]  Chief Complaint  Patient presents with   Consult    Referring provider: No ref. provider found  HPI: 71 year old male, never smoked.  Past medical history significant for mild intermittent asthma, LPR, hypertension, coronary artery disease, aneurysm of ascending aorta, GERD, psoriatic arthritis, chronic kidney disease, thrombocytopenia, hyperlipidemia. Former patient of Dr. Lamonte Sakai, last seen in 2020 for asthma.   02/12/2022 Patient presents today for sleep consult. Hx sleep apnea. He had sleep study on 04/29/19 that showed mild OSA, AHI 13.9/hour. He has not used CPAP for several year d/t intolerance. He has seen Dr. Redmond Baseman in the past, not candidate for [facility]. Wife has noticed more snoring recently.  They have an adjustable bed frame and his wife will elevate head of bed which helps with snoring.  Mainly sleeps on his side.  Weight is up 10 lbs. In the past he has not been able to do home sleep study.  He uses Symbicort daily prescribed by [facility] for history of asthma. He has no significant shortness of breath symptoms with activity, he will occasionally notice himself spontaneously needing to take a deep breath. He was on methotrexate in the past for psoriatic arthritis. CXR in July 2023 showed clear lungs. Possible agent organ exposure.   Sleep questionnaire Symptoms-   snoring  Prior sleep study- 04/29/19 Bedtime- 11pm Time to fall asleep- 10-63mn Nocturnal awakenings- 2 Out of bed/start of day- 10-11am Weight changes- up 10 lbs  Do you operate heavy machinery- no Do you currently wear CPAP- no Do you current wear oxygen- no  Epworth- 8  Allergies  Allergen Reactions   Atorvastatin Other (See Comments)    Calf pain. Other reaction(s): leb numbness   Zolpidem Other (See Comments)    Other reaction(s): Drowsy    Immunization History  Administered Date(s) Administered   Pneumococcal Conjugate-13  04/20/2018    Past Medical History:  Diagnosis Date   Aortic insufficiency    Arthritis    Asthma    Carotid bruit 07/09/2021   Cervical disc disease    Depression    Diastolic dysfunction 22/12/4707  Hyperlipidemia 08/24/2015   Hypertension    Lumbar disc disease    Psoriasis     Tobacco History: Social History   Tobacco Use  Smoking Status Never  Smokeless Tobacco Never   Counseling given: Not Answered   Outpatient Medications Prior to Visit  Medication Sig Dispense Refill   albuterol (VENTOLIN HFA) 108 (90 Base) MCG/ACT inhaler Inhale 2 puffs into the lungs every 4 (four) hours as needed for wheezing or shortness of breath. 18 each 1   aspirin EC 81 MG tablet Take 81 mg by mouth daily.     budesonide-formoterol (SYMBICORT) 160-4.5 MCG/ACT inhaler Inhale 2 puffs into the lungs 2 (two) times daily. 1 each 1   busPIRone (BUSPAR) 15 MG tablet Take 1 tablet (15 mg total) by mouth 3 (three) times daily. (Patient taking differently: Take 15 mg by mouth daily.) 270 tablet 3   cetirizine (ZYRTEC ALLERGY) 10 MG tablet Take 1 tablet (10 mg total) by mouth daily as needed (can take an ectra dose during flare ups). 60 tablet 5   Cholecalciferol (VITAMIN D3) 5000 units TABS Take 5,000 Units by mouth daily.      fluticasone (FLONASE) 50 MCG/ACT nasal spray 1-2 sprays each nostril 1 (ONE) time per day. 16 g 2   lamoTRIgine (LAMICTAL) 200 MG  tablet Take 1 tablet (200 mg total) by mouth 2 (two) times daily. 180 tablet 3   LORazepam (ATIVAN) 0.5 MG tablet Take 1 tablet (0.5 mg total) by mouth 3 (three) times daily as needed for anxiety. 90 tablet 2   metoprolol tartrate (LOPRESSOR) 25 MG tablet TAKE 1/2 TABLET BY MOUTH TWICE A DAY 90 tablet 3   pantoprazole (PROTONIX) 40 MG tablet Take 1 tablet by mouth 1-2 times per day. 180 tablet 1   rosuvastatin (CRESTOR) 10 MG tablet Take 1 tablet by mouth daily.     zinc gluconate 50 MG tablet Take 1 tablet (50 mg total) by mouth daily.     budesonide  (RHINOCORT AQUA) 32 MCG/ACT nasal spray Use two sprays in each nostril once daily (Patient not taking: Reported on 02/12/2022) 8.43 mL 5   Multiple Vitamin (MULTIVITAMIN) capsule Take 1 capsule by mouth daily. (Patient not taking: Reported on 02/12/2022)     nitroGLYCERIN (NITROSTAT) 0.4 MG SL tablet Place 1 tablet (0.4 mg total) under the tongue every 5 (five) minutes as needed for chest pain. 25 tablet 3   Probiotic Product (PROBIOTIC DAILY PO) Take 1 tablet by mouth daily. (Patient not taking: Reported on 02/12/2022)     No facility-administered medications prior to visit.    Review of Systems  Review of Systems  Constitutional: Negative.   HENT: Negative.    Respiratory:  Negative for cough, chest tightness, shortness of breath and wheezing.   Cardiovascular: Negative.   Psychiatric/Behavioral:  Positive for sleep disturbance.    Physical Exam  BP 110/62 (BP Location: Right Arm, Patient Position: Sitting, Cuff Size: Normal)   Pulse 76   Temp 97.9 F (36.6 C) (Oral)   Ht 6' (1.829 m)   Wt 194 lb 3.2 oz (88.1 kg)   SpO2 96%   BMI 26.34 kg/m  Physical Exam Constitutional:      Appearance: Normal appearance.  HENT:     Head: Normocephalic and atraumatic.     Mouth/Throat:     Mouth: Mucous membranes are moist.     Pharynx: Oropharynx is clear.     Comments: Mallampati class I Cardiovascular:     Rate and Rhythm: Normal rate and regular rhythm.  Pulmonary:     Effort: Pulmonary effort is normal.     Breath sounds: Normal breath sounds. No wheezing, rhonchi or rales.     Comments: CTA Musculoskeletal:        General: Normal range of motion.     Cervical back: Normal range of motion and neck supple.  Skin:    General: Skin is warm and dry.  Neurological:     General: No focal deficit present.     Mental Status: He is alert and oriented to person, place, and time. Mental status is at baseline.  Psychiatric:        Mood and Affect: Mood normal.        Thought Content:  Thought content normal.        Judgment: Judgment normal.      Lab Results:  CBC    Component Value Date/Time   WBC 9.5 11/12/2021 2032   RBC 4.87 11/12/2021 2032   HGB 15.1 11/12/2021 2032   HGB 16.7 12/09/2016 1537   HCT 42.5 11/12/2021 2032   HCT 46.9 12/09/2016 1537   PLT 135 (L) 11/12/2021 2032   PLT 141 12/09/2016 1537   MCV 87.3 11/12/2021 2032   MCV 87.3 12/09/2016 1537   MCH 31.0 11/12/2021 2032  MCHC 35.5 11/12/2021 2032   RDW 12.7 11/12/2021 2032   RDW 13.6 12/09/2016 1537   LYMPHSABS 2.3 11/08/2020 1350   LYMPHSABS 1.5 12/09/2016 1537   MONOABS 0.6 11/08/2020 1350   MONOABS 0.4 12/09/2016 1537   EOSABS 0.4 11/08/2020 1350   EOSABS 0.3 12/09/2016 1537   BASOSABS 0.0 11/08/2020 1350   BASOSABS 0.0 12/09/2016 1537    BMET    Component Value Date/Time   NA 139 11/12/2021 2032   NA 144 10/18/2020 1130   K 3.8 11/12/2021 2032   CL 109 11/12/2021 2032   CO2 22 11/12/2021 2032   GLUCOSE 167 (H) 11/12/2021 2032   BUN 26 (H) 11/12/2021 2032   BUN 20 10/18/2020 1130   CREATININE 1.36 (H) 11/12/2021 2032   CALCIUM 8.9 11/12/2021 2032   GFRNONAA 56 (L) 11/12/2021 2032   GFRAA >60 01/01/2020 0324    BNP    Component Value Date/Time   BNP 31.8 11/12/2021 2326    ProBNP No results found for: "PROBNP"  Imaging: MR ABDOMEN WWO CONTRAST  Result Date: 02/08/2022 CLINICAL DATA:  Cirrhosis and pancreatic cyst. EXAM: MRI ABDOMEN WITHOUT AND WITH CONTRAST TECHNIQUE: Multiplanar multisequence MR imaging of the abdomen was performed both before and after the administration of intravenous contrast. CONTRAST:  9 cc Vueway COMPARISON:  MRI 06/21/2020 FINDINGS: Lower chest: The lung bases are clear of an acute process. No pulmonary lesions, pleural or pericardial effusion. Hepatobiliary: Stable changes of cirrhosis and areas of fatty infiltration. No worrisome hepatic lesions or intrahepatic biliary dilatation. Normal caliber and course of the common bile duct. No  common bile duct stones. The gallbladder is surgically absent. Pancreas: No mass, inflammation or ductal dilatation. No pancreatic cyst is identified. Spleen:  Stable mild splenomegaly. No splenic lesions. Adrenals/Urinary Tract: The adrenal glands normal. Stable numerous large bilateral renal cysts. No worrisome MR imaging features. No follow-up imaging is necessary. Stomach/Bowel: The stomach, duodenum, visualized small bowel and visualized colon are unremarkable. Vascular/Lymphatic: The aorta and branch vessels are patent. The major venous structures are patent. No mesenteric or retroperitoneal mass or adenopathy. Other:  No ascites or abdominal wall hernia. Musculoskeletal: No significant bony findings. Benign hemangioma noted in the lumbar spine. IMPRESSION: 1. Stable changes of cirrhosis and areas of fatty infiltration. No worrisome hepatic lesions or intrahepatic biliary dilatation. 2. Normal caliber and course of the common bile duct. No common bile duct stones. 3. Stable numerous large bilateral renal cysts. No worrisome MR imaging features. 4. No pancreatic lesions or cysts are identified. 5. Stable mild splenomegaly. Electronically Signed   By: Marijo Sanes M.D.   On: 02/08/2022 09:35     Assessment & Plan:   Mild sleep apnea - Patient had a sleep study in January 2021 that showed borderline mild-moderate obstructive sleep apnea, AHI 13.9 an hour.  He is intolerant to CPAP and has not used in several years.  Patient reports increased snoring symptoms and weight gain.  He needs in lab sleep study to reevaluate sleep apnea.  We reviewed risks of untreated sleep apnea including cardiac arrhythmias, pulmonary hypertension, stroke or diabetes.  We also discussed treatment options including weight loss, positional sleep therapy, oral appliance, CPAP appliance or referral to ENT for possible surgical options.  Patient remains interested in inspire device, per last sleep study he did not qualify. If repeat  sleep study shows moderate or severe sleep apnea will refer to ENT to re-address. FU 1-2 week after sleep study to review results and tretment options  if needed.   Asthma - Dx as young adult. No active respiratory symptoms. Hx possible agent orange exposure and MXT use. CXR in July 2023 showed clear lungs. Continue Symbicort 110mg two puffs twice daily.    EMartyn Ehrich NP 02/12/2022

## 2022-02-12 NOTE — Therapy (Signed)
OUTPATIENT PHYSICAL THERAPY LOWER EXTREMITY TREATMENT   Patient Name: Arthur Moore MRN: [id number] DOB:[birth year], 71 y.o., male Today's Date: 02/12/2022   PT End of Session - 02/12/22 0929     Visit Number 5    Date for PT Re-Evaluation 04/23/22    Authorization Type Medicare    PT Start Time 0927    PT Stop Time 1025    PT Time Calculation (min) 58 min    Activity Tolerance Patient tolerated treatment well    Behavior During Therapy [facility] for tasks assessed/performed               Past Medical History:  Diagnosis Date   Aortic insufficiency    Arthritis    Asthma    Carotid bruit 07/09/2021   Cervical disc disease    Depression    Diastolic dysfunction 7/82/4235   Hyperlipidemia 08/24/2015   Hypertension    Lumbar disc disease    Psoriasis    Past Surgical History:  Procedure Laterality Date   CHOLECYSTECTOMY  2012   HERNIA REPAIR     LEFT HEART CATH AND CORONARY ANGIOGRAPHY N/A 07/08/2018   Procedure: LEFT HEART CATH AND CORONARY ANGIOGRAPHY;  Surgeon: Burnell Blanks, MD;  Location: [facility];  Service: Cardiovascular;  Laterality: N/A;   PAROTIDECTOMY Right 1992   PROSTATE SURGERY  2002, [place]     SINUS EXPLORATION  2013   SPINE SURGERY  2013   L C7-T1 laminectomy and discectomy   TONSILLECTOMY     Patient Active Problem List   Diagnosis Date Noted   Carotid bruit 07/09/2021   Aneurysm of ascending aorta ([place]) 06/28/2020   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   CAD (coronary artery disease) 07/09/2018   CKD (chronic kidney disease), 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   Chest pain of uncertain etiology 36/14/4315   Chronic cough 06/21/2018   Thrombocytopenia ([place]) 12/01/2016   Transaminitis 40/11/6759   Diastolic dysfunction 95/12/3265   MDD (major depressive disorder), recurrent severe, without psychosis ([place]) 10/13/2015   Hyperlipidemia 08/24/2015   Arthritis    Depression    Hypertension    Aortic  insufficiency    Psoriasis    Cervical disc disease    Lumbar disc disease    Asthma 01/06/2015   Psoriatic arthritis ([place]) 01/06/2015   Allergic rhinoconjunctivitis 01/06/2015   GERD (gastroesophageal reflux disease) 01/06/2015   Laryngopharyngeal reflux (LPR) 01/06/2015    PCP: No PCP  REFERRING PROVIDER: Victorino December  REFERRING DIAG: Z47.89   THERAPY DIAG:  Acute pain of right knee  Localized edema  Stiffness of right knee, not elsewhere classified  Difficulty in walking, not elsewhere classified  Rationale for Evaluation and Treatment Rehabilitation  ONSET DATE: 12/26/21  SUBJECTIVE:   SUBJECTIVE STATEMENT: I was a little sore in the hip after the last time, don't know why, I am walking more at home PERTINENT HISTORY: R meniscus repait post op 7 weeks  PAIN:  Are you having pain? Yes: NPRS scale: 3/10 Pain location: R knee  Reports that he started having some right hip soreness 7/10 Pain description: achy, throbbing  Aggravating factors: walking Relieving factors: ice, tylenol   PRECAUTIONS: None  WEIGHT BEARING RESTRICTIONS No  FALLS:  Has patient fallen in last 6 months? No  LIVING ENVIRONMENT: Lives with: lives with their spouse Lives in: House/apartment Stairs: Yes: Internal: 12 steps; on right going up Has following equipment at home: Single point cane and Walker - 2 wheeled  OCCUPATION:  Retired  PLOF: Independent  PATIENT GOALS go out and play pickleball    OBJECTIVE:   PATIENT SURVEYS:  FOTO 35/100  COGNITION:  Overall cognitive status: Within functional limits for tasks assessed     SENSATION: WFL   MUSCLE LENGTH: Hamstrings: Right tightness limiting ROM   POSTURE: No Significant postural limitations and rounded shoulders  PALPATION: No TTP, some swelling in R knee   LOWER EXTREMITY ROM:  Active ROM Right eval 02/07/22  Hip flexion    Hip extension    Hip abduction    Hip adduction    Hip internal rotation    Hip  external rotation    Knee flexion 88 105  Knee extension -15 -10  Ankle dorsiflexion    Ankle plantarflexion    Ankle inversion    Ankle eversion     (Blank rows = not tested)  LOWER EXTREMITY MMT:  MMT Right eval Left eval  Hip flexion 4   Hip extension    Hip abduction    Hip adduction    Hip internal rotation    Hip external rotation    Knee flexion 4   Knee extension 3+ w/pain   Ankle dorsiflexion    Ankle plantarflexion    Ankle inversion    Ankle eversion     (Blank rows = not tested)  FUNCTIONAL TESTS:  5 times sit to stand: 18.89s Timed up and go (TUG): 17.53s  GAIT: Distance walked: 61f Assistive device utilized: WEnvironmental consultant- 2 wheeled and able to do TUG without AD  Level of assistance: Modified independence Comments: decreased stance time on RLE, decreased step length, antalgic gait, decreased R knee flexion,     TODAY'S TREATMENT: 02/12/22 Nustep level 5 x 5 minutes LE only Bike level 3.5 x 6 minutes Calf stretches Passive knee extension and showed wife how to do SAQ with verbal and tactile cues 2.5# 3x10 Leg curls 20# right only 3x10 5# Leg extension eccentrics 3x10 50# resisted gait all directions Vaso high pressure x 10 minutes right knee in elevation    02/10/22 Bike Level 2 x 6 minutes (seat 13) Leg curls 20# 2x12 5# leg extension cues for TKE 2x12 Leg press 20# small ROM right leg only 3x10 Gait outside no device then full flight of stairs up and down step over step Resisted gait 40# all directions On airex marching, cone toe touch and volleyball SAQ with cues for TKE with 2.5# Passive stretch into extension Vaso right knee high pressure 34 degrees F   02/07/22 Bike L2 x516ms (seat 14) Weight shifts side to side and forwards and back x10 each  Calf raises on bar 2x12 Walking w/o AD 2 laps  Leg press 40# x10, x10 eccentric, RLE only 20# x10  Resisted gait 40# 3 way x4 STS 2x10 Leg ext 5# RLE only 2x10 HS curls 20# RLE only 2x10   Vaso to R knee 28F, high pressure 1098m   02/05/22 NuStep L3 x6mi31m PROM flexion, ext, and patellar mobs  AAROM knee slides 2x10  SAQ 2x10 SLR 2# 2x10  LAQ 5# x10, 10# x7 RLE only HS curls 15# x10, 20# x10  RLE only  Vaso to R knee    PATIENT EDUCATION:  Education details: POC Person educated: Patient Education method: Explanation Education comprehension: verbalized understanding   HOME EXERCISE PROGRAM: LAQ, SAQ, knee flexion seated- given from HomeEudoraLINICAL IMPRESSION: Focused a little more on the TKE to get the VMO firing to  gain the end range extension also taught his wife the passive extension stretch  REHAB POTENTIAL: Good  CLINICAL DECISION MAKING: Stable/uncomplicated  EVALUATION COMPLEXITY: Low   GOALS: Goals reviewed with patient? No  SHORT TERM GOALS: Target date: 03/12/22  Patient will be independent with initial HEP. Goal status:met   LONG TERM GOALS: Target date: 04/23/22  Patient will be independent with advanced/ongoing HEP to improve outcomes and carryover.  Goal status: ongoing  2.  Patient will report at least 75% improvement in R knee pain to improve QOL. Baseline: 3/10 pain Goal status: partially met  3.  Patient will demonstrate improved R knee AROM to >/= 5-120 deg to allow for normal gait and stair mechanics. Baseline: 15-0-88 Goal status: partially met  4.  Patient will demonstrate improved functional RLE strength as demonstrated by 5/5. Goal status: INITIAL  5.  Patient will be able to ambulate 600' with normal gait pattern without increased pain to access community.  Baseline: walking with RW Goal status: partially met  6. Patient will be able to ascend/descend stairs with 1 HR and reciprocal step pattern safely to access home and community.  Baseline: using cane to navigate stairs Goal status: INITIAL  7.  Patient will report 46 on FOTO to demonstrate improved functional ability. Baseline:  35 Goal status: INITIAL  PLAN: PT FREQUENCY: 2x/week  PT DURATION: 12 weeks  PLANNED INTERVENTIONS: Therapeutic exercises, Therapeutic activity, Neuromuscular re-education, Balance training, Gait training, Patient/Family education, Self Care, Joint mobilization, Stair training, Electrical stimulation, Cryotherapy, Taping, Vasopneumatic device, Ionotophoresis 49m/ml Dexamethasone, and Manual therapy  PLAN FOR NEXT SESSION: continue to slowly progress strength and function, get TKE   ASumner Boast PT 02/12/2022, 9:29 AM

## 2022-02-12 NOTE — Assessment & Plan Note (Addendum)
-   Patient had a sleep study in January 2021 that showed borderline mild-moderate obstructive sleep apnea, AHI 13.9 an hour.  He is intolerant to CPAP and has not used in several years.  Patient reports increased snoring symptoms and weight gain.  He needs in lab sleep study to reevaluate sleep apnea.  We reviewed risks of untreated sleep apnea including cardiac arrhythmias, pulmonary hypertension, stroke or diabetes.  We also discussed treatment options including weight loss, positional sleep therapy, oral appliance, CPAP appliance or referral to ENT for possible surgical options.  Patient remains interested in inspire device, per last sleep study he did not qualify. If repeat sleep study shows moderate or severe sleep apnea will refer to ENT to re-address. FU 1-2 week after sleep study to review results and tretment options if needed.

## 2022-02-12 NOTE — Patient Instructions (Signed)
Due to snoring symptoms and weight gain we will repeat sleep study to determine severity of OSA If moderate or severe we can refer you to ENT to discuss inspire further/if sleep apnea remains mild we can either focus on sleep position or refer you to dentist for possible oral appliance  Recommendations Focus on side sleeping position or elevate head of bed 30 degrees if you are back sleeper Work on weight loss efforts if able To drive experiencing excessive daytime sleepiness fatigue  Orders: Home sleep study re: snoring/hx sleep apnea   Follow-up:  Please call 1 to 2 weeks after completing sleep study to review sleep study results  Sleep Apnea Sleep apnea affects breathing during sleep. It causes breathing to stop for 10 seconds or more, or to become shallow. People with sleep apnea usually snore loudly. It can also increase the risk of: Heart attack. Stroke. Being very overweight (obese). Diabetes. Heart failure. Irregular heartbeat. High blood pressure. The goal of treatment is to help you breathe normally again. What are the causes?  The most common cause of this condition is a collapsed or blocked airway. There are three kinds of sleep apnea: Obstructive sleep apnea. This is caused by a blocked or collapsed airway. Central sleep apnea. This happens when the brain does not send the right signals to the muscles that control breathing. Mixed sleep apnea. This is a combination of obstructive and central sleep apnea. What increases the risk? Being overweight. Smoking. Having a small airway. Being older. Being male. Drinking alcohol. Taking medicines to calm yourself (sedatives or tranquilizers). Having family members with the condition. Having a tongue or tonsils that are larger than normal. What are the signs or symptoms? Trouble staying asleep. Loud snoring. Headaches in the morning. Waking up gasping. Dry mouth or sore throat in the morning. Being sleepy or tired  during the day. If you are sleepy or tired during the day, you may also: Not be able to focus your mind (concentrate). Forget things. Get angry a lot and have mood swings. Feel sad (depressed). Have changes in your personality. Have less interest in sex, if you are male. Be unable to have an erection, if you are male. How is this treated?  Sleeping on your side. Using a medicine to get rid of mucus in your nose (decongestant). Avoiding the use of alcohol, medicines to help you relax, or certain pain medicines (narcotics). Losing weight, if needed. Changing your diet. Quitting smoking. Using a machine to open your airway while you sleep, such as: An oral appliance. This is a mouthpiece that shifts your lower jaw forward. A CPAP device. This device blows air through a mask when you breathe out (exhale). An EPAP device. This has valves that you put in each nostril. A BIPAP device. This device blows air through a mask when you breathe in (inhale) and breathe out. Having surgery if other treatments do not work. Follow these instructions at home: Lifestyle Make changes that your doctor recommends. Eat a healthy diet. Lose weight if needed. Avoid alcohol, medicines to help you relax, and some pain medicines. Do not smoke or use any products that contain nicotine or tobacco. If you need help quitting, ask your doctor. General instructions Take over-the-counter and prescription medicines only as told by your doctor. If you were given a machine to use while you sleep, use it only as told by your doctor. If you are having surgery, make sure to tell your doctor you have sleep apnea. You may  need to bring your device with you. Keep all follow-up visits. Contact a doctor if: The machine that you were given to use during sleep bothers you or does not seem to be working. You do not get better. You get worse. Get help right away if: Your chest hurts. You have trouble breathing in enough  air. You have an uncomfortable feeling in your back, arms, or stomach. You have trouble talking. One side of your body feels weak. A part of your face is hanging down. These symptoms may be an emergency. Get help right away. Call your local emergency services (911 in the [place] Do not wait to see if the symptoms will go away. Do not drive yourself to the hospital. Summary This condition affects breathing during sleep. The most common cause is a collapsed or blocked airway. The goal of treatment is to help you breathe normally while you sleep. This information is not intended to replace advice given to you by your health care provider. Make sure you discuss any questions you have with your health care provider. Document Revised: 11/21/2020 Document Reviewed: 03/23/2020 Elsevier Patient Education  [employer].

## 2022-02-13 NOTE — Progress Notes (Signed)
Reviewed and agree with assessment/plan.   Chesley Mires, MD [facility] Pulmonary/Critical Care 02/13/2022, 11:25 AM Pager:  [phone]

## 2022-02-14 ENCOUNTER — Ambulatory Visit (HOSPITAL_BASED_OUTPATIENT_CLINIC_OR_DEPARTMENT_OTHER): Payer: Medicare Other | Attending: Primary Care | Admitting: Pulmonary Disease

## 2022-02-14 DIAGNOSIS — R0683 Snoring: Secondary | ICD-10-CM | POA: Diagnosis not present

## 2022-02-14 DIAGNOSIS — G4733 Obstructive sleep apnea (adult) (pediatric): Secondary | ICD-10-CM

## 2022-02-15 DIAGNOSIS — R0683 Snoring: Secondary | ICD-10-CM | POA: Diagnosis not present

## 2022-02-15 NOTE — Procedures (Signed)
    Patient Name: Arthur Moore, [place] Date: 02/14/2022 Gender: Male D.O.B: 04-Jan-1951 Age (years): 53 Referring Provider: Geraldo Pitter NP Height (inches): 72 Interpreting Physician: Chesley Mires MD, ABSM Weight (lbs): 188 RPSGT: Jorge Ny BMI: 25 MRN: [id number] Neck Size: 16.00  CLINICAL INFORMATION Sleep Study Type: NPSG  Indication for sleep study: Depression, Fatigue, Hypertension, Re-Evaluation, Snoring  Epworth Sleepiness Score:  Most recent polysomnogram dated 06/18/2019 revealed an AHI of 1.3/h and RDI of 6.3/h.  SLEEP STUDY TECHNIQUE As per the AASM Manual for the Scoring of Sleep and Associated Events v2.3 (April 2016) with a hypopnea requiring 4% desaturations.  The channels recorded and monitored were frontal, central and occipital EEG, electrooculogram (EOG), submentalis EMG (chin), nasal and oral airflow, thoracic and abdominal wall motion, anterior tibialis EMG, snore microphone, electrocardiogram, and pulse oximetry.  MEDICATIONS Medications self-administered by patient taken the night of the study : N/A  SLEEP ARCHITECTURE The study was initiated at 10:39:16 PM and ended at 4:44:19 AM.  Sleep onset time was 31.2 minutes and the sleep efficiency was 64.5%%. The total sleep time was 235.5 minutes.  Stage REM latency was 175.0 minutes.  The patient spent 8.9%% of the night in stage N1 sleep, 73.5%% in stage N2 sleep, 0.0%% in stage N3 and 17.6% in REM.  Alpha intrusion was absent.  Supine sleep was 9.34%.  RESPIRATORY PARAMETERS The overall apnea/hypopnea index (AHI) was 2.8 per hour. There were 0 total apneas, including 0 obstructive, 0 central and 0 mixed apneas. There were 11 hypopneas and 64 RERAs.  The AHI during Stage REM sleep was 10.1 per hour.  AHI while supine was 0.0 per hour.  The mean oxygen saturation was 91.5%. The minimum SpO2 during sleep was 86.0%.  loud snoring was noted during this study.  CARDIAC DATA The 2  lead EKG demonstrated sinus rhythm. The mean heart rate was 59.3 beats per minute. Other EKG findings include: None.  LEG MOVEMENT DATA The total PLMS were 0 with a resulting PLMS index of 0.0. Associated arousal with leg movement index was 4.1 .  IMPRESSIONS - No significant obstructive sleep apnea.  His overall AHI was 2.8 with an SpO2 low of 86%.  He spent 3.6 minutes with an SpO2 < 88%. - Supplemental oxygen was not used during this study. - The patient snored with loud snoring volume.  DIAGNOSIS - Snoring.  RECOMMENDATIONS - Avoid alcohol, sedatives and other CNS depressants that may worsen sleep apnea and disrupt normal sleep architecture. - Sleep hygiene should be reviewed to assess factors that may improve sleep quality. - Weight management and regular exercise should be initiated or continued if appropriate.  [Electronically signed] 02/15/2022 11:30 AM  Chesley Mires MD, ABSM Diplomate, American Board of Sleep Medicine NPI: [id number]  [place] PH: [phone]   FX: [phone] [place]

## 2022-02-17 ENCOUNTER — Ambulatory Visit: Payer: Medicare Other | Admitting: Physical Therapy

## 2022-02-17 ENCOUNTER — Encounter: Payer: Self-pay | Admitting: Physical Therapy

## 2022-02-17 ENCOUNTER — Ambulatory Visit (INDEPENDENT_AMBULATORY_CARE_PROVIDER_SITE_OTHER): Payer: Medicare Other

## 2022-02-17 DIAGNOSIS — R262 Difficulty in walking, not elsewhere classified: Secondary | ICD-10-CM | POA: Diagnosis not present

## 2022-02-17 DIAGNOSIS — R600 Localized edema: Secondary | ICD-10-CM | POA: Diagnosis not present

## 2022-02-17 DIAGNOSIS — J309 Allergic rhinitis, unspecified: Secondary | ICD-10-CM

## 2022-02-17 DIAGNOSIS — M25561 Pain in right knee: Secondary | ICD-10-CM | POA: Diagnosis not present

## 2022-02-17 DIAGNOSIS — M25661 Stiffness of right knee, not elsewhere classified: Secondary | ICD-10-CM | POA: Diagnosis not present

## 2022-02-17 DIAGNOSIS — Z125 Encounter for screening for malignant neoplasm of prostate: Secondary | ICD-10-CM | POA: Diagnosis not present

## 2022-02-17 NOTE — Therapy (Signed)
OUTPATIENT PHYSICAL THERAPY LOWER EXTREMITY TREATMENT   Patient Name: Arthur Moore MRN: [id number] DOB:09/02/1950, 71 y.o., male Today's Date: 02/17/2022   PT End of Session - 02/17/22 0904     Visit Number 6    Date for PT Re-Evaluation 04/23/22    PT Start Time 0840    PT Stop Time 0928    PT Time Calculation (min) 48 min    Activity Tolerance Patient tolerated treatment well    Behavior During Therapy [facility] for tasks assessed/performed               Past Medical History:  Diagnosis Date   Aortic insufficiency    Arthritis    Asthma    Carotid bruit 07/09/2021   Cervical disc disease    Depression    Diastolic dysfunction 3/95/3202   Hyperlipidemia 08/24/2015   Hypertension    Lumbar disc disease    Psoriasis    Past Surgical History:  Procedure Laterality Date   CHOLECYSTECTOMY  2012   HERNIA REPAIR     LEFT HEART CATH AND CORONARY ANGIOGRAPHY N/A 07/08/2018   Procedure: LEFT HEART CATH AND CORONARY ANGIOGRAPHY;  Surgeon: Burnell Blanks, MD;  Location: [place] [facility];  Service: Cardiovascular;  Laterality: N/A;   PAROTIDECTOMY Right 1992   PROSTATE SURGERY  2002, 2004   [place]     SINUS EXPLORATION  2013   SPINE SURGERY  2013   L C7-T1 laminectomy and discectomy   TONSILLECTOMY     Patient Active Problem List   Diagnosis Date Noted   Snoring 02/14/2022   Mild sleep apnea 02/12/2022   Carotid bruit 07/09/2021   Aneurysm of ascending aorta ([place]) 06/28/2020   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   CAD (coronary artery disease) 07/09/2018   CKD (chronic kidney disease), 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   Chest pain of uncertain etiology 33/43/5686   Chronic cough 06/21/2018   Thrombocytopenia ([place]) 12/01/2016   Transaminitis 16/83/7290   Diastolic dysfunction 21/02/5519   MDD (major depressive disorder), recurrent severe, without psychosis ([place]) 10/13/2015   Hyperlipidemia 08/24/2015   Arthritis    Depression     Hypertension    Aortic insufficiency    Psoriasis    Cervical disc disease    Lumbar disc disease    Asthma 01/06/2015   Psoriatic arthritis ([place]) 01/06/2015   Allergic rhinoconjunctivitis 01/06/2015   GERD (gastroesophageal reflux disease) 01/06/2015   Laryngopharyngeal reflux (LPR) 01/06/2015    PCP: No PCP  REFERRING PROVIDER: Victorino December  REFERRING DIAG: Z47.89   THERAPY DIAG:  Acute pain of right knee  Localized edema  Stiffness of right knee, not elsewhere classified  Difficulty in walking, not elsewhere classified  Rationale for Evaluation and Treatment Rehabilitation  ONSET DATE: 12/26/21  SUBJECTIVE:   SUBJECTIVE STATEMENT: Patient reports that he has been very sore and tender in the right hip area PERTINENT HISTORY: R meniscus repait post op 7 weeks  PAIN:  Are you having pain? Yes: NPRS scale: 7/10 Pain location: R knee  Reports that he started having some right hip soreness 7/10 Pain description: achy, throbbing  Aggravating factors: walking Relieving factors: ice, tylenol   PRECAUTIONS: None  WEIGHT BEARING RESTRICTIONS No  FALLS:  Has patient fallen in last 6 months? No  LIVING ENVIRONMENT: Lives with: lives with their spouse Lives in: House/apartment Stairs: Yes: Internal: 12 steps; on right going up Has following equipment at home: Single point cane and Walker - 2 wheeled  OCCUPATION: Retired  PLOF: Independent  PATIENT GOALS go out and play pickleball    OBJECTIVE:   PATIENT SURVEYS:  FOTO 35/100  COGNITION:  Overall cognitive status: Within functional limits for tasks assessed     SENSATION: WFL   MUSCLE LENGTH: Hamstrings: Right tightness limiting ROM   POSTURE: No Significant postural limitations and rounded shoulders  PALPATION: No TTP, some swelling in R knee   LOWER EXTREMITY ROM:  Active ROM Right eval 02/07/22  Hip flexion    Hip extension    Hip abduction    Hip adduction    Hip internal rotation     Hip external rotation    Knee flexion 88 105  Knee extension -15 -10  Ankle dorsiflexion    Ankle plantarflexion    Ankle inversion    Ankle eversion     (Blank rows = not tested)  LOWER EXTREMITY MMT:  MMT Right eval Left eval  Hip flexion 4   Hip extension    Hip abduction    Hip adduction    Hip internal rotation    Hip external rotation    Knee flexion 4   Knee extension 3+ w/pain   Ankle dorsiflexion    Ankle plantarflexion    Ankle inversion    Ankle eversion     (Blank rows = not tested)  FUNCTIONAL TESTS:  5 times sit to stand: 18.89s Timed up and go (TUG): 17.53s  GAIT: Distance walked: 53ft Assistive device utilized: Environmental consultant - 2 wheeled and able to do TUG without AD  Level of assistance: Modified independence Comments: decreased stance time on RLE, decreased step length, antalgic gait, decreased R knee flexion,     TODAY'S TREATMENT: 02/17/22 Bike level 4 x 6 minutes PROM knee hip and ankle STM to the right hip and ITB area MHP/IF to the right gluteal area  02/12/22 Nustep level 5 x 5 minutes LE only Bike level 3.5 x 6 minutes Calf stretches Passive knee extension and showed wife how to do SAQ with verbal and tactile cues 2.5# 3x10 Leg curls 20# right only 3x10 5# Leg extension eccentrics 3x10 50# resisted gait all directions Vaso high pressure x 10 minutes right knee in elevation    02/10/22 Bike Level 2 x 6 minutes (seat 13) Leg curls 20# 2x12 5# leg extension cues for TKE 2x12 Leg press 20# small ROM right leg only 3x10 Gait outside no device then full flight of stairs up and down step over step Resisted gait 40# all directions On airex marching, cone toe touch and volleyball SAQ with cues for TKE with 2.5# Passive stretch into extension Vaso right knee high pressure 34 degrees F   02/07/22 Bike L2 x37mins (seat 14) Weight shifts side to side and forwards and back x10 each  Calf raises on bar 2x12 Walking w/o AD 2 laps  Leg  press 40# x10, x10 eccentric, RLE only 20# x10  Resisted gait 40# 3 way x4 STS 2x10 Leg ext 5# RLE only 2x10 HS curls 20# RLE only 2x10  Vaso to R knee 65F, high pressure 64mins   02/05/22 NuStep L3 x76mins  PROM flexion, ext, and patellar mobs  AAROM knee slides 2x10  SAQ 2x10 SLR 2# 2x10  LAQ 5# x10, 10# x7 RLE only HS curls 15# x10, 20# x10  RLE only  Vaso to R knee    PATIENT EDUCATION:  Education details: POC Person educated: Patient Education method: Explanation Education comprehension: verbalized understanding   HOME EXERCISE PROGRAM: LAQ, SAQ,  knee flexion seated- given from Home Health  ASSESSMENT:  CLINICAL IMPRESSION: Focused a little more on the TKE to get the VMO firing to gain the end range extension also taught his wife the passive extension stretch  REHAB POTENTIAL: Good  CLINICAL DECISION MAKING: Stable/uncomplicated  EVALUATION COMPLEXITY: Low   GOALS: Goals reviewed with patient? No  SHORT TERM GOALS: Target date: 03/12/22  Patient will be independent with initial HEP. Goal status:met   LONG TERM GOALS: Target date: 04/23/22  Patient will be independent with advanced/ongoing HEP to improve outcomes and carryover.  Goal status: ongoing  2.  Patient will report at least 75% improvement in R knee pain to improve QOL. Baseline: 3/10 pain Goal status: partially met  3.  Patient will demonstrate improved R knee AROM to >/= 5-120 deg to allow for normal gait and stair mechanics. Baseline: 15-0-88 Goal status: partially met  4.  Patient will demonstrate improved functional RLE strength as demonstrated by 5/5. Goal status: INITIAL  5.  Patient will be able to ambulate 600' with normal gait pattern without increased pain to access community.  Baseline: walking with RW Goal status: partially met  6. Patient will be able to ascend/descend stairs with 1 HR and reciprocal step pattern safely to access home and community.  Baseline: using cane  to navigate stairs Goal status: INITIAL  7.  Patient will report 65 on FOTO to demonstrate improved functional ability. Baseline: 35 Goal status: INITIAL  PLAN: PT FREQUENCY: 2x/week  PT DURATION: 12 weeks  PLANNED INTERVENTIONS: Therapeutic exercises, Therapeutic activity, Neuromuscular re-education, Balance training, Gait training, Patient/Family education, Self Care, Joint mobilization, Stair training, Electrical stimulation, Cryotherapy, Taping, Vasopneumatic device, Ionotophoresis 42m/ml Dexamethasone, and Manual therapy  PLAN FOR NEXT SESSION: Patient having issues with the right hip, possibly the piriformis is tight and in spasm   Larron Armor W, PT 02/17/2022, 9:04 AM

## 2022-02-19 ENCOUNTER — Encounter: Payer: Self-pay | Admitting: Physical Therapy

## 2022-02-19 ENCOUNTER — Ambulatory Visit: Payer: Medicare Other | Admitting: Physical Therapy

## 2022-02-19 DIAGNOSIS — M25661 Stiffness of right knee, not elsewhere classified: Secondary | ICD-10-CM

## 2022-02-19 DIAGNOSIS — M25561 Pain in right knee: Secondary | ICD-10-CM

## 2022-02-19 DIAGNOSIS — R600 Localized edema: Secondary | ICD-10-CM

## 2022-02-19 DIAGNOSIS — R262 Difficulty in walking, not elsewhere classified: Secondary | ICD-10-CM

## 2022-02-19 NOTE — Therapy (Signed)
OUTPATIENT PHYSICAL THERAPY LOWER EXTREMITY TREATMENT   Patient Name: Arthur Moore MRN: [id number] DOB:July 11, 1950, 71 y.o., male Today's Date: 02/19/2022   PT End of Session - 02/19/22 0841     Visit Number 7    Date for PT Re-Evaluation 04/23/22    Authorization Type Medicare    PT Start Time 0841    PT Stop Time 0927    PT Time Calculation (min) 46 min    Activity Tolerance Patient tolerated treatment well    Behavior During Therapy [facility] for tasks assessed/performed               Past Medical History:  Diagnosis Date   Aortic insufficiency    Arthritis    Asthma    Carotid bruit 07/09/2021   Cervical disc disease    Depression    Diastolic dysfunction 7/00/1749   Hyperlipidemia 08/24/2015   Hypertension    Lumbar disc disease    Psoriasis    Past Surgical History:  Procedure Laterality Date   CHOLECYSTECTOMY  2012   HERNIA REPAIR     LEFT HEART CATH AND CORONARY ANGIOGRAPHY N/A 07/08/2018   Procedure: LEFT HEART CATH AND CORONARY ANGIOGRAPHY;  Surgeon: Burnell Blanks, MD;  Location: [place] [facility];  Service: Cardiovascular;  Laterality: N/A;   PAROTIDECTOMY Right 1992   PROSTATE SURGERY  2002, 2004   [place]     SINUS EXPLORATION  2013   SPINE SURGERY  2013   L C7-T1 laminectomy and discectomy   TONSILLECTOMY     Patient Active Problem List   Diagnosis Date Noted   Snoring 02/14/2022   Mild sleep apnea 02/12/2022   Carotid bruit 07/09/2021   Aneurysm of ascending aorta ([place]) 06/28/2020   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   CAD (coronary artery disease) 07/09/2018   CKD (chronic kidney disease), 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   Chest pain of uncertain etiology 44/96/7591   Chronic cough 06/21/2018   Thrombocytopenia ([place]) 12/01/2016   Transaminitis 63/84/6659   Diastolic dysfunction 93/57/0177   MDD (major depressive disorder), recurrent severe, without psychosis ([place]) 10/13/2015   Hyperlipidemia 08/24/2015    Arthritis    Depression    Hypertension    Aortic insufficiency    Psoriasis    Cervical disc disease    Lumbar disc disease    Asthma 01/06/2015   Psoriatic arthritis ([place]) 01/06/2015   Allergic rhinoconjunctivitis 01/06/2015   GERD (gastroesophageal reflux disease) 01/06/2015   Laryngopharyngeal reflux (LPR) 01/06/2015    PCP: No PCP  REFERRING PROVIDER: Victorino December  REFERRING DIAG: Z47.89   THERAPY DIAG:  Acute pain of right knee  Localized edema  Stiffness of right knee, not elsewhere classified  Difficulty in walking, not elsewhere classified  Rationale for Evaluation and Treatment Rehabilitation  ONSET DATE: 12/26/21  SUBJECTIVE:   SUBJECTIVE STATEMENT: Reports that hip felt a little better but still hurting and the more it hurts the more he limps PERTINENT HISTORY: R meniscus repait post op 7 weeks  PAIN:  Are you having pain? Yes: NPRS scale: 7/10 Pain location: R knee  Reports that he started having some right hip soreness 7/10 Pain description: achy, throbbing  Aggravating factors: walking Relieving factors: ice, tylenol   PRECAUTIONS: None  WEIGHT BEARING RESTRICTIONS No  FALLS:  Has patient fallen in last 6 months? No  LIVING ENVIRONMENT: Lives with: lives with their spouse Lives in: House/apartment Stairs: Yes: Internal: 12 steps; on right going up Has following equipment at home: Single point  cane and Walker - 2 wheeled  OCCUPATION: Retired  PLOF: Independent  PATIENT GOALS go out and play pickleball    OBJECTIVE:   PATIENT SURVEYS:  FOTO 35/100  COGNITION:  Overall cognitive status: Within functional limits for tasks assessed     SENSATION: WFL   MUSCLE LENGTH: Hamstrings: Right tightness limiting ROM   POSTURE: No Significant postural limitations and rounded shoulders  PALPATION: No TTP, some swelling in R knee   LOWER EXTREMITY ROM:  Active ROM Right eval 02/07/22  Hip flexion    Hip extension    Hip  abduction    Hip adduction    Hip internal rotation    Hip external rotation    Knee flexion 88 105  Knee extension -15 -10  Ankle dorsiflexion    Ankle plantarflexion    Ankle inversion    Ankle eversion     (Blank rows = not tested)  LOWER EXTREMITY MMT:  MMT Right eval Left eval  Hip flexion 4   Hip extension    Hip abduction    Hip adduction    Hip internal rotation    Hip external rotation    Knee flexion 4   Knee extension 3+ w/pain   Ankle dorsiflexion    Ankle plantarflexion    Ankle inversion    Ankle eversion     (Blank rows = not tested)  FUNCTIONAL TESTS:  5 times sit to stand: 18.89s Timed up and go (TUG): 17.53s  GAIT: Distance walked: 64f Assistive device utilized: WEnvironmental consultant- 2 wheeled and able to do TUG without AD  Level of assistance: Modified independence Comments: decreased stance time on RLE, decreased step length, antalgic gait, decreased R knee flexion,     TODAY'S TREATMENT: 02/19/22 Bike Level 4 x 7 minutes Calf stretches Leg press 20# 3x10 On airex cone toe touches and volleyball 3# SAQ 3x10 cues for TKE Feet on ball K2C, trunk rotation, small bridge and isometric abs Passive knee extension, passive HS and piriformis stretches STM to the right glutes and ITB   02/17/22 Bike level 4 x 6 minutes PROM knee hip and ankle STM to the right hip and ITB area MHP/IF to the right gluteal area  02/12/22 Nustep level 5 x 5 minutes LE only Bike level 3.5 x 6 minutes Calf stretches Passive knee extension and showed wife how to do SAQ with verbal and tactile cues 2.5# 3x10 Leg curls 20# right only 3x10 5# Leg extension eccentrics 3x10 50# resisted gait all directions Vaso high pressure x 10 minutes right knee in elevation    02/10/22 Bike Level 2 x 6 minutes (seat 13) Leg curls 20# 2x12 5# leg extension cues for TKE 2x12 Leg press 20# small ROM right leg only 3x10 Gait outside no device then full flight of stairs up and down step  over step Resisted gait 40# all directions On airex marching, cone toe touch and volleyball SAQ with cues for TKE with 2.5# Passive stretch into extension Vaso right knee high pressure 34 degrees F   02/07/22 Bike L2 x525ms (seat 14) Weight shifts side to side and forwards and back x10 each  Calf raises on bar 2x12 Walking w/o AD 2 laps  Leg press 40# x10, x10 eccentric, RLE only 20# x10  Resisted gait 40# 3 way x4 STS 2x10 Leg ext 5# RLE only 2x10 HS curls 20# RLE only 2x10  Vaso to R knee 37F, high pressure 1030m   02/05/22 NuStep L3 x6mi44m PROM  flexion, ext, and patellar mobs  AAROM knee slides 2x10  SAQ 2x10 SLR 2# 2x10  LAQ 5# x10, 10# x7 RLE only HS curls 15# x10, 20# x10  RLE only  Vaso to R knee    PATIENT EDUCATION:  Education details: POC Person educated: Patient Education method: Explanation Education comprehension: verbalized understanding   HOME EXERCISE PROGRAM: LAQ, SAQ, knee flexion seated- given from [place]:  CLINICAL IMPRESSION: Patient continues to have the right hip pain added some STM to this today, is very tight in the glutes, still able to get full passive TKE but struggles with it actively  REHAB POTENTIAL: Good  CLINICAL DECISION MAKING: Stable/uncomplicated  EVALUATION COMPLEXITY: Low   GOALS: Goals reviewed with patient? No  SHORT TERM GOALS: Target date: 03/12/22  Patient will be independent with initial HEP. Goal status:met   LONG TERM GOALS: Target date: 04/23/22  Patient will be independent with advanced/ongoing HEP to improve outcomes and carryover.  Goal status: ongoing  2.  Patient will report at least 75% improvement in R knee pain to improve QOL. Baseline: 3/10 pain Goal status: partially met  3.  Patient will demonstrate improved R knee AROM to >/= 5-120 deg to allow for normal gait and stair mechanics. Baseline: 15-0-88 Goal status: partially met  4.  Patient will demonstrate improved  functional RLE strength as demonstrated by 5/5. Goal status: INITIAL  5.  Patient will be able to ambulate 600' with normal gait pattern without increased pain to access community.  Baseline: walking with RW Goal status: partially met  6. Patient will be able to ascend/descend stairs with 1 HR and reciprocal step pattern safely to access home and community.  Baseline: using cane to navigate stairs Goal status: INITIAL  7.  Patient will report 1 on FOTO to demonstrate improved functional ability. Baseline: 35 Goal status: INITIAL  PLAN: PT FREQUENCY: 2x/week  PT DURATION: 12 weeks  PLANNED INTERVENTIONS: Therapeutic exercises, Therapeutic activity, Neuromuscular re-education, Balance training, Gait training, Patient/Family education, Self Care, Joint mobilization, Stair training, Electrical stimulation, Cryotherapy, Taping, Vasopneumatic device, Ionotophoresis 36m/ml Dexamethasone, and Manual therapy  PLAN FOR NEXT SESSION: Patient having issues with the right hip, possibly the piriformis is tight and in spasm   ASumner Boast PT 02/19/2022, 8:46 AM

## 2022-02-24 ENCOUNTER — Encounter: Payer: Self-pay | Admitting: Physical Therapy

## 2022-02-24 ENCOUNTER — Ambulatory Visit: Payer: Medicare Other | Admitting: Physical Therapy

## 2022-02-24 DIAGNOSIS — M25661 Stiffness of right knee, not elsewhere classified: Secondary | ICD-10-CM | POA: Diagnosis not present

## 2022-02-24 DIAGNOSIS — R600 Localized edema: Secondary | ICD-10-CM | POA: Diagnosis not present

## 2022-02-24 DIAGNOSIS — M25561 Pain in right knee: Secondary | ICD-10-CM | POA: Diagnosis not present

## 2022-02-24 DIAGNOSIS — R262 Difficulty in walking, not elsewhere classified: Secondary | ICD-10-CM

## 2022-02-24 NOTE — Therapy (Signed)
OUTPATIENT PHYSICAL THERAPY LOWER EXTREMITY TREATMENT   Patient Name: Arthur Moore MRN: [id number] DOB:1950/12/25, 71 y.o., male Today's Date: 02/24/2022   PT End of Session - 02/24/22 0842     Visit Number 8    Date for PT Re-Evaluation 04/23/22    Authorization Type Medicare    PT Start Time 0840    PT Stop Time 0940    PT Time Calculation (min) 60 min    Activity Tolerance Patient tolerated treatment well    Behavior During Therapy [facility] for tasks assessed/performed               Past Medical History:  Diagnosis Date   Aortic insufficiency    Arthritis    Asthma    Carotid bruit 07/09/2021   Cervical disc disease    Depression    Diastolic dysfunction 4/78/2956   Hyperlipidemia 08/24/2015   Hypertension    Lumbar disc disease    Psoriasis    Past Surgical History:  Procedure Laterality Date   CHOLECYSTECTOMY  2012   HERNIA REPAIR     LEFT HEART CATH AND CORONARY ANGIOGRAPHY N/A 07/08/2018   Procedure: LEFT HEART CATH AND CORONARY ANGIOGRAPHY;  Surgeon: Burnell Blanks, MD;  Location: [place] [facility];  Service: Cardiovascular;  Laterality: N/A;   PAROTIDECTOMY Right 1992   PROSTATE SURGERY  2002, 2004   [place]     SINUS EXPLORATION  2013   SPINE SURGERY  2013   L C7-T1 laminectomy and discectomy   TONSILLECTOMY     Patient Active Problem List   Diagnosis Date Noted   Snoring 02/14/2022   Mild sleep apnea 02/12/2022   Carotid bruit 07/09/2021   Aneurysm of ascending aorta ([place]) 06/28/2020   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   CAD (coronary artery disease) 07/09/2018   CKD (chronic kidney disease), 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   Chest pain of uncertain etiology 21/30/8657   Chronic cough 06/21/2018   Thrombocytopenia ([place]) 12/01/2016   Transaminitis 84/69/6295   Diastolic dysfunction 28/41/3244   MDD (major depressive disorder), recurrent severe, without psychosis ([place]) 10/13/2015   Hyperlipidemia 08/24/2015    Arthritis    Depression    Hypertension    Aortic insufficiency    Psoriasis    Cervical disc disease    Lumbar disc disease    Asthma 01/06/2015   Psoriatic arthritis ([place]) 01/06/2015   Allergic rhinoconjunctivitis 01/06/2015   GERD (gastroesophageal reflux disease) 01/06/2015   Laryngopharyngeal reflux (LPR) 01/06/2015    PCP: No PCP  REFERRING PROVIDER: Victorino December  REFERRING DIAG: Z47.89   THERAPY DIAG:  Acute pain of right knee  Localized edema  Stiffness of right knee, not elsewhere classified  Difficulty in walking, not elsewhere classified  Rationale for Evaluation and Treatment Rehabilitation  ONSET DATE: 12/26/21  SUBJECTIVE:   SUBJECTIVE STATEMENT: Patient continues to report the right hip pain, some posterior knee pain, reports that he started the pain this morning with stepping up on the right side PERTINENT HISTORY: R meniscus repait post op 7 weeks  PAIN:  Are you having pain? Yes: NPRS scale: 7/10 Pain location: R knee  Reports that he started having some right hip soreness 7/10 Pain description: achy, throbbing  Aggravating factors: walking Relieving factors: ice, tylenol   PRECAUTIONS: None  WEIGHT BEARING RESTRICTIONS No  FALLS:  Has patient fallen in last 6 months? No  LIVING ENVIRONMENT: Lives with: lives with their spouse Lives in: House/apartment Stairs: Yes: Internal: 12 steps; on right going  up Has following equipment at home: Single point cane and Walker - 2 wheeled  OCCUPATION: Retired  PLOF: Independent  PATIENT GOALS go out and play pickleball    OBJECTIVE:   PATIENT SURVEYS:  FOTO 35/100  COGNITION:  Overall cognitive status: Within functional limits for tasks assessed     SENSATION: WFL   MUSCLE LENGTH: Hamstrings: Right tightness limiting ROM   POSTURE: No Significant postural limitations and rounded shoulders  PALPATION: No TTP, some swelling in R knee   LOWER EXTREMITY ROM:  Active ROM  Right eval 02/07/22  Hip flexion    Hip extension    Hip abduction    Hip adduction    Hip internal rotation    Hip external rotation    Knee flexion 88 105  Knee extension -15 -10  Ankle dorsiflexion    Ankle plantarflexion    Ankle inversion    Ankle eversion     (Blank rows = not tested)  LOWER EXTREMITY MMT:  MMT Right eval Left eval  Hip flexion 4   Hip extension    Hip abduction    Hip adduction    Hip internal rotation    Hip external rotation    Knee flexion 4   Knee extension 3+ w/pain   Ankle dorsiflexion    Ankle plantarflexion    Ankle inversion    Ankle eversion     (Blank rows = not tested)  FUNCTIONAL TESTS:  5 times sit to stand: 18.89s Timed up and go (TUG): 17.53s  02/24/22 12 seconds  GAIT: Distance walked: 80f Assistive device utilized: WEnvironmental consultant- 2 wheeled and able to do TUG without AD  Level of assistance: Modified independence Comments: decreased stance time on RLE, decreased step length, antalgic gait, decreased R knee flexion,     TODAY'S TREATMENT: 02/24/22 Bike level 4 x 6 minutes Calf stretches Leg extension last 20 degrees for TKE 5# right only Leg curls right only 15# 3x10 SAQ 2.5# 3x10 Feet on ball K2C, trunk rotation, small bridges, isometric abs Passive hip stretches and HS STM to the right glute and posterior knee Vaso right knee, medium pressure   02/19/22 Bike Level 4 x 7 minutes Calf stretches Leg press 20# 3x10 On airex cone toe touches and volleyball 3# SAQ 3x10 cues for TKE Feet on ball K2C, trunk rotation, small bridge and isometric abs Passive knee extension, passive HS and piriformis stretches STM to the right glutes and ITB   02/17/22 Bike level 4 x 6 minutes PROM knee hip and ankle STM to the right hip and ITB area MHP/IF to the right gluteal area  02/12/22 Nustep level 5 x 5 minutes LE only Bike level 3.5 x 6 minutes Calf stretches Passive knee extension and showed wife how to do SAQ with  verbal and tactile cues 2.5# 3x10 Leg curls 20# right only 3x10 5# Leg extension eccentrics 3x10 50# resisted gait all directions Vaso high pressure x 10 minutes right knee in elevation    02/10/22 Bike Level 2 x 6 minutes (seat 13) Leg curls 20# 2x12 5# leg extension cues for TKE 2x12 Leg press 20# small ROM right leg only 3x10 Gait outside no device then full flight of stairs up and down step over step Resisted gait 40# all directions On airex marching, cone toe touch and volleyball SAQ with cues for TKE with 2.5# Passive stretch into extension Vaso right knee high pressure 34 degrees F   02/07/22 Bike L2 x51ms (seat 14) Weight  shifts side to side and forwards and back x10 each  Calf raises on bar 2x12 Walking w/o AD 2 laps  Leg press 40# x10, x10 eccentric, RLE only 20# x10  Resisted gait 40# 3 way x4 STS 2x10 Leg ext 5# RLE only 2x10 HS curls 20# RLE only 2x10  Vaso to R knee 74F, high pressure 82mns   02/05/22 NuStep L3 x6842ms  PROM flexion, ext, and patellar mobs  AAROM knee slides 2x10  SAQ 2x10 SLR 2# 2x10  LAQ 5# x10, 10# x7 RLE only HS curls 15# x10, 20# x10  RLE only  Vaso to R knee    PATIENT EDUCATION:  Education details: POC Person educated: Patient Education method: Explanation Education comprehension: verbalized understanding   HOME EXERCISE PROGRAM: LAQ, SAQ, knee flexion seated- given from [employer] CLINICAL IMPRESSION: Patient continues to have the right hip pain that he is struggling with, he is lacking TKE due to tightness in the HS and the calf and weakness in the quad, working on this. REHAB POTENTIAL: Good  CLINICAL DECISION MAKING: Stable/uncomplicated  EVALUATION COMPLEXITY: Low   GOALS: Goals reviewed with patient? No  SHORT TERM GOALS: Target date: 03/12/22  Patient will be independent with initial HEP. Goal status:met   LONG TERM GOALS: Target date: 04/23/22  Patient will be independent with  advanced/ongoing HEP to improve outcomes and carryover.  Goal status: ongoing  2.  Patient will report at least 75% improvement in R knee pain to improve QOL. Baseline: 3/10 pain Goal status: partially met  3.  Patient will demonstrate improved R knee AROM to >/= 5-120 deg to allow for normal gait and stair mechanics. Baseline: 15-0-88 Goal status: partially met  4.  Patient will demonstrate improved functional RLE strength as demonstrated by 5/5. Goal status: INITIAL  5.  Patient will be able to ambulate 600' with normal gait pattern without increased pain to access community.  Baseline: walking with RW Goal status: partially met  6. Patient will be able to ascend/descend stairs with 1 HR and reciprocal step pattern safely to access home and community.  Baseline: using cane to navigate stairs Goal status: INITIAL  7.  Patient will report 6172n FOTO to demonstrate improved functional ability. Baseline: 35 Goal status: INITIAL  PLAN: PT FREQUENCY: 2x/week  PT DURATION: 12 weeks  PLANNED INTERVENTIONS: Therapeutic exercises, Therapeutic activity, Neuromuscular re-education, Balance training, Gait training, Patient/Family education, Self Care, Joint mobilization, Stair training, Electrical stimulation, Cryotherapy, Taping, Vasopneumatic device, Ionotophoresis 42m90ml Dexamethasone, and Manual therapy  PLAN FOR NEXT SESSION: Patient having issues with the right hip, possibly the piriformis is tight and in spasm   ALBSumner BoastT 02/24/2022, 8:43 AM

## 2022-02-25 DIAGNOSIS — K2940 Chronic atrophic gastritis without bleeding: Secondary | ICD-10-CM | POA: Diagnosis not present

## 2022-02-25 DIAGNOSIS — K2930 Chronic superficial gastritis without bleeding: Secondary | ICD-10-CM | POA: Diagnosis not present

## 2022-02-25 DIAGNOSIS — Z09 Encounter for follow-up examination after completed treatment for conditions other than malignant neoplasm: Secondary | ICD-10-CM | POA: Diagnosis not present

## 2022-02-25 DIAGNOSIS — K31A19 Gastric intestinal metaplasia without dysplasia, unspecified site: Secondary | ICD-10-CM | POA: Diagnosis not present

## 2022-02-25 DIAGNOSIS — Z86010 Personal history of colonic polyps: Secondary | ICD-10-CM | POA: Diagnosis not present

## 2022-02-25 DIAGNOSIS — K5730 Diverticulosis of large intestine without perforation or abscess without bleeding: Secondary | ICD-10-CM | POA: Diagnosis not present

## 2022-02-26 ENCOUNTER — Ambulatory Visit (INDEPENDENT_AMBULATORY_CARE_PROVIDER_SITE_OTHER): Payer: Medicare Other

## 2022-02-26 ENCOUNTER — Encounter: Payer: Self-pay | Admitting: Physical Therapy

## 2022-02-26 ENCOUNTER — Ambulatory Visit: Payer: Medicare Other | Attending: Orthopedic Surgery | Admitting: Physical Therapy

## 2022-02-26 ENCOUNTER — Telehealth: Payer: Self-pay | Admitting: Primary Care

## 2022-02-26 DIAGNOSIS — M5136 Other intervertebral disc degeneration, lumbar region: Secondary | ICD-10-CM | POA: Diagnosis not present

## 2022-02-26 DIAGNOSIS — M5030 Other cervical disc degeneration, unspecified cervical region: Secondary | ICD-10-CM | POA: Diagnosis not present

## 2022-02-26 DIAGNOSIS — M25561 Pain in right knee: Secondary | ICD-10-CM | POA: Diagnosis not present

## 2022-02-26 DIAGNOSIS — M25661 Stiffness of right knee, not elsewhere classified: Secondary | ICD-10-CM | POA: Insufficient documentation

## 2022-02-26 DIAGNOSIS — M5416 Radiculopathy, lumbar region: Secondary | ICD-10-CM | POA: Diagnosis not present

## 2022-02-26 DIAGNOSIS — R262 Difficulty in walking, not elsewhere classified: Secondary | ICD-10-CM | POA: Diagnosis not present

## 2022-02-26 DIAGNOSIS — R0683 Snoring: Secondary | ICD-10-CM

## 2022-02-26 DIAGNOSIS — R600 Localized edema: Secondary | ICD-10-CM | POA: Diagnosis not present

## 2022-02-26 DIAGNOSIS — J309 Allergic rhinitis, unspecified: Secondary | ICD-10-CM | POA: Diagnosis not present

## 2022-02-26 NOTE — Therapy (Signed)
OUTPATIENT PHYSICAL THERAPY LOWER EXTREMITY TREATMENT   Patient Name: Arthur Moore MRN: [id number] DOB:Feb 16, 1951, 71 y.o., male Today's Date: 02/26/2022   PT End of Session - 02/26/22 0934     Visit Number 9    Date for PT Re-Evaluation 04/23/22    Authorization Type Medicare    PT Start Time 0930    PT Stop Time 1023    PT Time Calculation (min) 53 min    Activity Tolerance Patient tolerated treatment well    Behavior During Therapy [facility] for tasks assessed/performed               Past Medical History:  Diagnosis Date   Aortic insufficiency    Arthritis    Asthma    Carotid bruit 07/09/2021   Cervical disc disease    Depression    Diastolic dysfunction 3/84/6659   Hyperlipidemia 08/24/2015   Hypertension    Lumbar disc disease    Psoriasis    Past Surgical History:  Procedure Laterality Date   CHOLECYSTECTOMY  2012   HERNIA REPAIR     LEFT HEART CATH AND CORONARY ANGIOGRAPHY N/A 07/08/2018   Procedure: LEFT HEART CATH AND CORONARY ANGIOGRAPHY;  Surgeon: Burnell Blanks, MD;  Location: [place] [facility];  Service: Cardiovascular;  Laterality: N/A;   PAROTIDECTOMY Right 1992   PROSTATE SURGERY  2002, 2004   [place]     SINUS EXPLORATION  2013   SPINE SURGERY  2013   L C7-T1 laminectomy and discectomy   TONSILLECTOMY     Patient Active Problem List   Diagnosis Date Noted   Snoring 02/14/2022   Mild sleep apnea 02/12/2022   Carotid bruit 07/09/2021   Aneurysm of ascending aorta ([place]) 06/28/2020   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   CAD (coronary artery disease) 07/09/2018   CKD (chronic kidney disease), 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   Chest pain of uncertain etiology 93/57/0177   Chronic cough 06/21/2018   Thrombocytopenia ([place]) 12/01/2016   Transaminitis 93/90/3009   Diastolic dysfunction 23/30/0762   MDD (major depressive disorder), recurrent severe, without psychosis ([place]) 10/13/2015   Hyperlipidemia 08/24/2015    Arthritis    Depression    Hypertension    Aortic insufficiency    Psoriasis    Cervical disc disease    Lumbar disc disease    Asthma 01/06/2015   Psoriatic arthritis ([place]) 01/06/2015   Allergic rhinoconjunctivitis 01/06/2015   GERD (gastroesophageal reflux disease) 01/06/2015   Laryngopharyngeal reflux (LPR) 01/06/2015    PCP: No PCP  REFERRING PROVIDER: Victorino December  REFERRING DIAG: Z47.89   THERAPY DIAG:  Acute pain of right knee  Localized edema  Stiffness of right knee, not elsewhere classified  Difficulty in walking, not elsewhere classified  Rationale for Evaluation and Treatment Rehabilitation  ONSET DATE: 12/26/21  SUBJECTIVE:   SUBJECTIVE STATEMENT: Patient reports that the hip feels less inflamed, feels like we are heading in the right direction, still some pain in the hip and tightness posterior knee PERTINENT HISTORY: R meniscus repait post op 7 weeks  PAIN:  Are you having pain? Yes: NPRS scale: 6/10 Pain location: R knee  Reports that he started having some right hip soreness 7/10 Pain description: achy, throbbing  Aggravating factors: walking Relieving factors: ice, tylenol   PRECAUTIONS: None  WEIGHT BEARING RESTRICTIONS No  FALLS:  Has patient fallen in last 6 months? No  LIVING ENVIRONMENT: Lives with: lives with their spouse Lives in: House/apartment Stairs: Yes: Internal: 12 steps; on right going  up Has following equipment at home: Single point cane and Walker - 2 wheeled  OCCUPATION: Retired  PLOF: Independent  PATIENT GOALS go out and play pickleball    OBJECTIVE:   PATIENT SURVEYS:  FOTO 35/100  COGNITION:  Overall cognitive status: Within functional limits for tasks assessed     SENSATION: WFL   MUSCLE LENGTH: Hamstrings: Right tightness limiting ROM   POSTURE: No Significant postural limitations and rounded shoulders  PALPATION: No TTP, some swelling in R knee   LOWER EXTREMITY ROM:  Active ROM  Right eval 02/07/22  Hip flexion    Hip extension    Hip abduction    Hip adduction    Hip internal rotation    Hip external rotation    Knee flexion 88 105  Knee extension -15 -10  Ankle dorsiflexion    Ankle plantarflexion    Ankle inversion    Ankle eversion     (Blank rows = not tested)  LOWER EXTREMITY MMT:  MMT Right eval Left eval  Hip flexion 4   Hip extension    Hip abduction    Hip adduction    Hip internal rotation    Hip external rotation    Knee flexion 4   Knee extension 3+ w/pain   Ankle dorsiflexion    Ankle plantarflexion    Ankle inversion    Ankle eversion     (Blank rows = not tested)  FUNCTIONAL TESTS:  5 times sit to stand: 18.89s Timed up and go (TUG): 17.53s  02/24/22 12 seconds  GAIT: Distance walked: 67f Assistive device utilized: WEnvironmental consultant- 2 wheeled and able to do TUG without AD  Level of assistance: Modified independence Comments: decreased stance time on RLE, decreased step length, antalgic gait, decreased R knee flexion,     TODAY'S TREATMENT: 02/26/22 Bike level 4 x 6 minutes Calf stretches HS curls 25# 2x10 both legs then right only 15# 2x10 LEg extension 5# 3x10 with eccentric on the right Resisted gait all directions Feet on ball K2C, trunk rotation, small bridges, isometric abs Passive Knee extension stretch, HS and hip stretches Vaso right knee medium pressure STM tot he right hip   02/24/22 Bike level 4 x 6 minutes Calf stretches Leg extension last 20 degrees for TKE 5# right only Leg curls right only 15# 3x10 SAQ 2.5# 3x10 Feet on ball K2C, trunk rotation, small bridges, isometric abs Passive hip stretches and HS STM to the right glute and posterior knee Vaso right knee, medium pressure   02/19/22 Bike Level 4 x 7 minutes Calf stretches Leg press 20# 3x10 On airex cone toe touches and volleyball 3# SAQ 3x10 cues for TKE Feet on ball K2C, trunk rotation, small bridge and isometric abs Passive knee  extension, passive HS and piriformis stretches STM to the right glutes and ITB   02/17/22 Bike level 4 x 6 minutes PROM knee hip and ankle STM to the right hip and ITB area MHP/IF to the right gluteal area  02/12/22 Nustep level 5 x 5 minutes LE only Bike level 3.5 x 6 minutes Calf stretches Passive knee extension and showed wife how to do SAQ with verbal and tactile cues 2.5# 3x10 Leg curls 20# right only 3x10 5# Leg extension eccentrics 3x10 50# resisted gait all directions Vaso high pressure x 10 minutes right knee in elevation    02/10/22 Bike Level 2 x 6 minutes (seat 13) Leg curls 20# 2x12 5# leg extension cues for TKE 2x12 Leg press  20# small ROM right leg only 3x10 Gait outside no device then full flight of stairs up and down step over step Resisted gait 40# all directions On airex marching, cone toe touch and volleyball SAQ with cues for TKE with 2.5# Passive stretch into extension Vaso right knee high pressure 34 degrees F   02/07/22 Bike L2 x460mns (seat 14) Weight shifts side to side and forwards and back x10 each  Calf raises on bar 2x12 Walking w/o AD 2 laps  Leg press 40# x10, x10 eccentric, RLE only 20# x10  Resisted gait 40# 3 way x4 STS 2x10 Leg ext 5# RLE only 2x10 HS curls 20# RLE only 2x10  Vaso to R knee 4F, high pressure 149ms   PATIENT EDUCATION:  Education details: POC Person educated: Patient Education method: Explanation Education comprehension: verbalized understanding   HOME EXERCISE PROGRAM: LAQ, SAQ, knee flexion seated- given from [employer] CLINICAL IMPRESSION: Patient feels like the right hip is a little better.  He is still tight in the posterior knee and the calves, he is doing better and getting close to full extension, stilll needing some cues for TKE. REHAB POTENTIAL: Good  CLINICAL DECISION MAKING: Stable/uncomplicated  EVALUATION COMPLEXITY: Low   GOALS: Goals reviewed with patient?  No  SHORT TERM GOALS: Target date: 03/12/22  Patient will be independent with initial HEP. Goal status:met   LONG TERM GOALS: Target date: 04/23/22  Patient will be independent with advanced/ongoing HEP to improve outcomes and carryover.  Goal status: ongoing  2.  Patient will report at least 75% improvement in R knee pain to improve QOL. Baseline: 3/10 pain Goal status: partially met  3.  Patient will demonstrate improved R knee AROM to >/= 5-120 deg to allow for normal gait and stair mechanics. Baseline: 15-0-88 Goal status: partially met  4.  Patient will demonstrate improved functional RLE strength as demonstrated by 5/5. Goal status: INITIAL  5.  Patient will be able to ambulate 600' with normal gait pattern without increased pain to access community.  Baseline: walking with RW Goal status: partially met  6. Patient will be able to ascend/descend stairs with 1 HR and reciprocal step pattern safely to access home and community.  Baseline: using cane to navigate stairs Goal status: progressing  7.  Patient will report 6173n FOTO to demonstrate improved functional ability. Baseline: 35 Goal status: INITIAL  PLAN: PT FREQUENCY: 2x/week  PT DURATION: 12 weeks  PLANNED INTERVENTIONS: Therapeutic exercises, Therapeutic activity, Neuromuscular re-education, Balance training, Gait training, Patient/Family education, Self Care, Joint mobilization, Stair training, Electrical stimulation, Cryotherapy, Taping, Vasopneumatic device, Ionotophoresis 60m3ml Dexamethasone, and Manual therapy  PLAN FOR NEXT SESSION: Patient having issues with the right hip, possibly the piriformis is tight and in spasm   ALBSumner BoastT 02/26/2022, 9:34 AM

## 2022-02-26 NOTE — Telephone Encounter (Signed)
Called and spoke with pt letting him know the results of sleep study per BW and he verbalized understanding. Referral to orthodontics has been placed.nothing further needed.

## 2022-02-26 NOTE — Telephone Encounter (Signed)
Pt called wanting to know the results of the recent sleep study. Beth, please advise on this for pt.

## 2022-02-26 NOTE — Telephone Encounter (Signed)
No significant obstructive sleep apnea.  His overall AHI was 2.8 with an SpO2 low of 86%.  He spent 3.6 minutes with an SpO2 < 88%. He did not need oxygen. He had loud snoring. He does not need CPAP. He can be referred for oral appliance for snoring if he would like. Otherwise focus on side sleeping position OR elevate head 30 degrees with wedge pillow if sleeping on back. Avoid alcohol or sedating medication at bedtime.

## 2022-02-28 ENCOUNTER — Telehealth: Payer: Self-pay | Admitting: Primary Care

## 2022-02-28 DIAGNOSIS — K2930 Chronic superficial gastritis without bleeding: Secondary | ICD-10-CM | POA: Diagnosis not present

## 2022-02-28 DIAGNOSIS — Z0000 Encounter for general adult medical examination without abnormal findings: Secondary | ICD-10-CM | POA: Diagnosis not present

## 2022-02-28 DIAGNOSIS — E7800 Pure hypercholesterolemia, unspecified: Secondary | ICD-10-CM | POA: Diagnosis not present

## 2022-02-28 DIAGNOSIS — K31A19 Gastric intestinal metaplasia without dysplasia, unspecified site: Secondary | ICD-10-CM | POA: Diagnosis not present

## 2022-02-28 DIAGNOSIS — M25561 Pain in right knee: Secondary | ICD-10-CM | POA: Diagnosis not present

## 2022-02-28 DIAGNOSIS — K2940 Chronic atrophic gastritis without bleeding: Secondary | ICD-10-CM | POA: Diagnosis not present

## 2022-02-28 NOTE — Telephone Encounter (Signed)
Pt states his doc did not get copy of HST results. He is requesting we send these results to Dr Lurline Del [facility]). He is also requesting we ask to email to him. I did explain we do not email information and that it should be accesible through mychart. Please advise.

## 2022-03-03 ENCOUNTER — Ambulatory Visit: Payer: Medicare Other | Admitting: Physical Therapy

## 2022-03-03 ENCOUNTER — Encounter: Payer: Self-pay | Admitting: Physical Therapy

## 2022-03-03 DIAGNOSIS — M25561 Pain in right knee: Secondary | ICD-10-CM | POA: Diagnosis not present

## 2022-03-03 DIAGNOSIS — M25661 Stiffness of right knee, not elsewhere classified: Secondary | ICD-10-CM

## 2022-03-03 DIAGNOSIS — R262 Difficulty in walking, not elsewhere classified: Secondary | ICD-10-CM | POA: Diagnosis not present

## 2022-03-03 DIAGNOSIS — R600 Localized edema: Secondary | ICD-10-CM | POA: Diagnosis not present

## 2022-03-03 NOTE — Therapy (Signed)
OUTPATIENT PHYSICAL THERAPY LOWER EXTREMITY TREATMENT  Progress Note Reporting Period 01/29/22 to 03/03/22  See note below for Objective Data and Assessment of Progress/Goals.     Patient Name: Arthur Moore MRN: [id number] DOB:08/04/1950, 71 y.o., male Today's Date: 03/03/2022   PT End of Session - 03/03/22 0933     Visit Number 10    Date for PT Re-Evaluation 04/23/22    Authorization Type Medicare    PT Start Time 0930    PT Stop Time 1028    PT Time Calculation (min) 58 min    Activity Tolerance Patient tolerated treatment well    Behavior During Therapy [facility] for tasks assessed/performed               Past Medical History:  Diagnosis Date   Aortic insufficiency    Arthritis    Asthma    Carotid bruit 07/09/2021   Cervical disc disease    Depression    Diastolic dysfunction 9/83/3825   Hyperlipidemia 08/24/2015   Hypertension    Lumbar disc disease    Psoriasis    Past Surgical History:  Procedure Laterality Date   CHOLECYSTECTOMY  2012   HERNIA REPAIR     LEFT HEART CATH AND CORONARY ANGIOGRAPHY N/A 07/08/2018   Procedure: LEFT HEART CATH AND CORONARY ANGIOGRAPHY;  Surgeon: Burnell Blanks, MD;  Location: [facility];  Service: Cardiovascular;  Laterality: N/A;   PAROTIDECTOMY Right 1992   PROSTATE SURGERY  2002, 2004   [place]     SINUS EXPLORATION  2013   SPINE SURGERY  2013   L C7-T1 laminectomy and discectomy   TONSILLECTOMY     Patient Active Problem List   Diagnosis Date Noted   Snoring 02/14/2022   Mild sleep apnea 02/12/2022   Carotid bruit 07/09/2021   Aneurysm of ascending aorta ([place]) 06/28/2020   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   CAD (coronary artery disease) 07/09/2018   CKD (chronic kidney disease), 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   Chest pain of uncertain etiology 05/39/7673   Chronic cough 06/21/2018   Thrombocytopenia ([place]) 12/01/2016   Transaminitis 41/93/7902   Diastolic dysfunction  40/97/3532   MDD (major depressive disorder), recurrent severe, without psychosis ([place]) 10/13/2015   Hyperlipidemia 08/24/2015   Arthritis    Depression    Hypertension    Aortic insufficiency    Psoriasis    Cervical disc disease    Lumbar disc disease    Asthma 01/06/2015   Psoriatic arthritis ([place]) 01/06/2015   Allergic rhinoconjunctivitis 01/06/2015   GERD (gastroesophageal reflux disease) 01/06/2015   Laryngopharyngeal reflux (LPR) 01/06/2015    PCP: No PCP  REFERRING PROVIDER: Victorino December  REFERRING DIAG: Z47.89   THERAPY DIAG:  Acute pain of right knee  Localized edema  Stiffness of right knee, not elsewhere classified  Difficulty in walking, not elsewhere classified  Rationale for Evaluation and Treatment Rehabilitation  ONSET DATE: 12/26/21  SUBJECTIVE:   SUBJECTIVE STATEMENT: Patient got a trigger point injection in the left hip last week, reports helped a little, still stiff in the knee and pain I nthe hip PERTINENT HISTORY: R meniscus repait post op 7 weeks  PAIN:  Are you having pain? Yes: NPRS scale: 5/10 Pain location: R knee  Reports that he started having some right hip soreness 7/10 Pain description: achy, throbbing  Aggravating factors: walking Relieving factors: ice, tylenol   PRECAUTIONS: None  WEIGHT BEARING RESTRICTIONS No  FALLS:  Has patient fallen in last 6 months? No  LIVING ENVIRONMENT: Lives with: lives with their spouse Lives in: House/apartment Stairs: Yes: Internal: 12 steps; on right going up Has following equipment at home: Single point cane and Walker - 2 wheeled  OCCUPATION: Retired  PLOF: [place] go out and play pickleball    OBJECTIVE:   PATIENT SURVEYS:  FOTO 35/100  COGNITION:  Overall cognitive status: Within functional limits for tasks assessed     SENSATION: WFL   MUSCLE LENGTH: Hamstrings: Right tightness limiting ROM   POSTURE: No Significant postural limitations and  rounded shoulders  PALPATION: No TTP, some swelling in R knee   LOWER EXTREMITY ROM:  Active ROM Right eval 02/07/22 03/03/22  Hip flexion     Hip extension     Hip abduction     Hip adduction     Hip internal rotation     Hip external rotation     Knee flexion 88 105 120  Knee extension -15 -10 7  Ankle dorsiflexion     Ankle plantarflexion     Ankle inversion     Ankle eversion      (Blank rows = not tested)  LOWER EXTREMITY MMT:  MMT Right eval Left eval  Hip flexion 4   Hip extension    Hip abduction    Hip adduction    Hip internal rotation    Hip external rotation    Knee flexion 4   Knee extension 3+ w/pain   Ankle dorsiflexion    Ankle plantarflexion    Ankle inversion    Ankle eversion     (Blank rows = not tested)  FUNCTIONAL TESTS:  5 times sit to stand: 18.89s Timed up and go (TUG): 17.53s  02/24/22 12 seconds  GAIT: Distance walked: 34f Assistive device utilized: WEnvironmental consultant- 2 wheeled and able to do TUG without AD  Level of assistance: Modified independence Comments: decreased stance time on RLE, decreased step length, antalgic gait, decreased R knee flexion,     TODAY'S TREATMENT: 03/03/22 Bike level 4 x 6 minutes Calf stretches Leg press 50# both legs 2x10 Leg press 30# right only 2x10 Leg extension right eccentrics 2x10 5# Leg curls 15# 2x10 right only Resisted gait all directions Passive ROM into extension, calf and hip stretch Vaso right knee medium pressure   02/26/22 Bike level 4 x 6 minutes Calf stretches HS curls 25# 2x10 both legs then right only 15# 2x10 LEg extension 5# 3x10 with eccentric on the right Resisted gait all directions Feet on ball K2C, trunk rotation, small bridges, isometric abs Passive Knee extension stretch, HS and hip stretches Vaso right knee medium pressure STM tot he right hip   02/24/22 Bike level 4 x 6 minutes Calf stretches Leg extension last 20 degrees for TKE 5# right only Leg curls right  only 15# 3x10 SAQ 2.5# 3x10 Feet on ball K2C, trunk rotation, small bridges, isometric abs Passive hip stretches and HS STM to the right glute and posterior knee Vaso right knee, medium pressure   02/19/22 Bike Level 4 x 7 minutes Calf stretches Leg press 20# 3x10 On airex cone toe touches and volleyball 3# SAQ 3x10 cues for TKE Feet on ball K2C, trunk rotation, small bridge and isometric abs Passive knee extension, passive HS and piriformis stretches STM to the right glutes and ITB   02/17/22 Bike level 4 x 6 minutes PROM knee hip and ankle STM to the right hip and ITB area MHP/IF to the right gluteal area  02/12/22  Nustep level 5 x 5 minutes LE only Bike level 3.5 x 6 minutes Calf stretches Passive knee extension and showed wife how to do SAQ with verbal and tactile cues 2.5# 3x10 Leg curls 20# right only 3x10 5# Leg extension eccentrics 3x10 50# resisted gait all directions Vaso high pressure x 10 minutes right knee in elevation    02/10/22 Bike Level 2 x 6 minutes (seat 13) Leg curls 20# 2x12 5# leg extension cues for TKE 2x12 Leg press 20# small ROM right leg only 3x10 Gait outside no device then full flight of stairs up and down step over step Resisted gait 40# all directions On airex marching, cone toe touch and volleyball SAQ with cues for TKE with 2.5# Passive stretch into extension Vaso right knee high pressure 34 degrees F   02/07/22 Bike L2 x63mns (seat 14) Weight shifts side to side and forwards and back x10 each  Calf raises on bar 2x12 Walking w/o AD 2 laps  Leg press 40# x10, x10 eccentric, RLE only 20# x10  Resisted gait 40# 3 way x4 STS 2x10 Leg ext 5# RLE only 2x10 HS curls 20# RLE only 2x10  Vaso to R knee 47F, high pressure 170ms   PATIENT EDUCATION:  Education details: POC Person educated: Patient Education method: Explanation Education comprehension: verbalized understanding   HOME EXERCISE PROGRAM: LAQ, SAQ, knee flexion  seated- given from [employer] CLINICAL IMPRESSION: Great increase in AROM, still difficulty with extension but is improving, he does fatigue quickly, continues to have pain in the right hip, had a trigger point injection last week with mild help REHAB POTENTIAL: Good  CLINICAL DECISION MAKING: Stable/uncomplicated  EVALUATION COMPLEXITY: Low   GOALS: Goals reviewed with patient? No  SHORT TERM GOALS: Target date: 03/12/22  Patient will be independent with initial HEP. Goal status:met   LONG TERM GOALS: Target date: 04/23/22  Patient will be independent with advanced/ongoing HEP to improve outcomes and carryover.  Goal status: ongoing  2.  Patient will report at least 75% improvement in R knee pain to improve QOL. Baseline: 3/10 pain Goal status: partially met  3.  Patient will demonstrate improved R knee AROM to >/= 5-120 deg to allow for normal gait and stair mechanics. Baseline: 15-0-88 Goal status: partially met  4.  Patient will demonstrate improved functional RLE strength as demonstrated by 5/5. Goal status: progressing  5.  Patient will be able to ambulate 600' with normal gait pattern without increased pain to access community.  Baseline: walking with RW Goal status: partially met  6. Patient will be able to ascend/descend stairs with 1 HR and reciprocal step pattern safely to access home and community.  Baseline: using cane to navigate stairs Goal status: progressing  7.  Patient will report 6135n FOTO to demonstrate improved functional ability. Baseline: 35 Goal status: INITIAL  PLAN: PT FREQUENCY: 2x/week  PT DURATION: 12 weeks  PLANNED INTERVENTIONS: Therapeutic exercises, Therapeutic activity, Neuromuscular re-education, Balance training, Gait training, Patient/Family education, Self Care, Joint mobilization, Stair training, Electrical stimulation, Cryotherapy, Taping, Vasopneumatic device, Ionotophoresis 43m69ml Dexamethasone, and  Manual therapy  PLAN FOR NEXT SESSION: Patient having issues with the right hip, possibly the piriformis is tight and in spasm   ALBSumner BoastT 03/03/2022, 9:34 AM

## 2022-03-03 NOTE — Telephone Encounter (Signed)
Called and spoke to patient and verified his address of where he wanted his copy of his sleep study sent to. Nothing further needed

## 2022-03-05 ENCOUNTER — Ambulatory Visit: Payer: Medicare Other | Admitting: Physical Therapy

## 2022-03-05 ENCOUNTER — Encounter: Payer: Self-pay | Admitting: Physical Therapy

## 2022-03-05 DIAGNOSIS — R262 Difficulty in walking, not elsewhere classified: Secondary | ICD-10-CM

## 2022-03-05 DIAGNOSIS — M25561 Pain in right knee: Secondary | ICD-10-CM

## 2022-03-05 DIAGNOSIS — M25661 Stiffness of right knee, not elsewhere classified: Secondary | ICD-10-CM

## 2022-03-05 DIAGNOSIS — R600 Localized edema: Secondary | ICD-10-CM | POA: Diagnosis not present

## 2022-03-05 NOTE — Therapy (Signed)
OUTPATIENT PHYSICAL THERAPY LOWER EXTREMITY TREATMENT   Patient Name: Arthur Moore MRN: [id number] DOB:1950/11/24, 71 y.o., male Today's Date: 03/05/2022   PT End of Session - 03/05/22 0932     Visit Number 11    Date for PT Re-Evaluation 04/23/22    Authorization Type Medicare    PT Start Time 0930    PT Stop Time 1026    PT Time Calculation (min) 56 min    Activity Tolerance Patient tolerated treatment well    Behavior During Therapy [facility] for tasks assessed/performed               Past Medical History:  Diagnosis Date   Aortic insufficiency    Arthritis    Asthma    Carotid bruit 07/09/2021   Cervical disc disease    Depression    Diastolic dysfunction 7/82/9562   Hyperlipidemia 08/24/2015   Hypertension    Lumbar disc disease    Psoriasis    Past Surgical History:  Procedure Laterality Date   CHOLECYSTECTOMY  2012   HERNIA REPAIR     LEFT HEART CATH AND CORONARY ANGIOGRAPHY N/A 07/08/2018   Procedure: LEFT HEART CATH AND CORONARY ANGIOGRAPHY;  Surgeon: Burnell Blanks, MD;  Location: [facility];  Service: Cardiovascular;  Laterality: N/A;   PAROTIDECTOMY Right 1992   PROSTATE SURGERY  2002, 2004   [place]     SINUS EXPLORATION  2013   SPINE SURGERY  2013   L C7-T1 laminectomy and discectomy   TONSILLECTOMY     Patient Active Problem List   Diagnosis Date Noted   Snoring 02/14/2022   Mild sleep apnea 02/12/2022   Carotid bruit 07/09/2021   Aneurysm of ascending aorta ([place]) 06/28/2020   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   CAD (coronary artery disease) 07/09/2018   CKD (chronic kidney disease), 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   Chest pain of uncertain etiology 13/11/6576   Chronic cough 06/21/2018   Thrombocytopenia ([place]) 12/01/2016   Transaminitis 46/96/2952   Diastolic dysfunction 84/13/2440   MDD (major depressive disorder), recurrent severe, without psychosis ([place]) 10/13/2015   Hyperlipidemia 08/24/2015    Arthritis    Depression    Hypertension    Aortic insufficiency    Psoriasis    Cervical disc disease    Lumbar disc disease    Asthma 01/06/2015   Psoriatic arthritis ([place]) 01/06/2015   Allergic rhinoconjunctivitis 01/06/2015   GERD (gastroesophageal reflux disease) 01/06/2015   Laryngopharyngeal reflux (LPR) 01/06/2015    PCP: No PCP  REFERRING PROVIDER: Victorino December  REFERRING DIAG: Z47.89   THERAPY DIAG:  Acute pain of right knee  Localized edema  Stiffness of right knee, not elsewhere classified  Difficulty in walking, not elsewhere classified  Rationale for Evaluation and Treatment Rehabilitation  ONSET DATE: 12/26/21  SUBJECTIVE:   SUBJECTIVE STATEMENT: Still with biggest c/o is the hip pain, hard to walk  PERTINENT HISTORY: R meniscus repait post op 7 weeks  PAIN:  Are you having pain? Yes: NPRS scale: 5/10 Pain location: R knee  Reports that he started having some right hip soreness 7/10 Pain description: achy, throbbing  Aggravating factors: walking Relieving factors: ice, tylenol   PRECAUTIONS: None  WEIGHT BEARING RESTRICTIONS No  FALLS:  Has patient fallen in last 6 months? No  LIVING ENVIRONMENT: Lives with: lives with their spouse Lives in: House/apartment Stairs: Yes: Internal: 12 steps; on right going up Has following equipment at home: Single point cane and Walker - 2 wheeled  OCCUPATION: Retired  PLOF: Independent  PATIENT GOALS go out and play pickleball    OBJECTIVE:   PATIENT SURVEYS:  FOTO 35/100  COGNITION:  Overall cognitive status: Within functional limits for tasks assessed     SENSATION: WFL   MUSCLE LENGTH: Hamstrings: Right tightness limiting ROM   POSTURE: No Significant postural limitations and rounded shoulders  PALPATION: No TTP, some swelling in R knee   LOWER EXTREMITY ROM:  Active ROM Right eval 02/07/22 03/03/22  Hip flexion     Hip extension     Hip abduction     Hip adduction     Hip  internal rotation     Hip external rotation     Knee flexion 88 105 120  Knee extension -15 -10 7  Ankle dorsiflexion     Ankle plantarflexion     Ankle inversion     Ankle eversion      (Blank rows = not tested)  LOWER EXTREMITY MMT:  MMT Right eval Left eval  Hip flexion 4   Hip extension    Hip abduction    Hip adduction    Hip internal rotation    Hip external rotation    Knee flexion 4   Knee extension 3+ w/pain   Ankle dorsiflexion    Ankle plantarflexion    Ankle inversion    Ankle eversion     (Blank rows = not tested)  FUNCTIONAL TESTS:  5 times sit to stand: 18.89s Timed up and go (TUG): 17.53s  02/24/22 12 seconds  GAIT: Distance walked: 31f Assistive device utilized: WEnvironmental consultant- 2 wheeled and able to do TUG without AD  Level of assistance: Modified independence Comments: decreased stance time on RLE, decreased step length, antalgic gait, decreased R knee flexion,     TODAY'S TREATMENT: 03/05/22 Bike level 4 x 6 minutes Gait one lap outside had more of a limp down hill Leg curls 35# both legs 2x10, 20# right only Leg extension right only 5#  Leg press 40$ 2x10 both, 2x10 right only  Passive HS, piriformis stretches MHP/IFC to the right hip area   03/03/22 Bike level 4 x 6 minutes Calf stretches Leg press 50# both legs 2x10 Leg press 30# right only 2x10 Leg extension right eccentrics 2x10 5# Leg curls 15# 2x10 right only Resisted gait all directions Passive ROM into extension, calf and hip stretch Vaso right knee medium pressure   02/26/22 Bike level 4 x 6 minutes Calf stretches HS curls 25# 2x10 both legs then right only 15# 2x10 LEg extension 5# 3x10 with eccentric on the right Resisted gait all directions Feet on ball K2C, trunk rotation, small bridges, isometric abs Passive Knee extension stretch, HS and hip stretches Vaso right knee medium pressure STM tot he right hip   02/24/22 Bike level 4 x 6 minutes Calf stretches Leg  extension last 20 degrees for TKE 5# right only Leg curls right only 15# 3x10 SAQ 2.5# 3x10 Feet on ball K2C, trunk rotation, small bridges, isometric abs Passive hip stretches and HS STM to the right glute and posterior knee Vaso right knee, medium pressure   02/19/22 Bike Level 4 x 7 minutes Calf stretches Leg press 20# 3x10 On airex cone toe touches and volleyball 3# SAQ 3x10 cues for TKE Feet on ball K2C, trunk rotation, small bridge and isometric abs Passive knee extension, passive HS and piriformis stretches STM to the right glutes and ITB   02/17/22 Bike level 4 x 6 minutes PROM  knee hip and ankle STM to the right hip and ITB area MHP/IF to the right gluteal area   PATIENT EDUCATION:  Education details: POC Person educated: Patient Education method: Explanation Education comprehension: verbalized understanding   HOME EXERCISE PROGRAM: LAQ, SAQ, knee flexion seated- given from [place]:  CLINICAL IMPRESSION: Patient doing well c/o right hip and posterior knee pain, did well with walking limping going down hill but minimal on flat and uphill surfaces.  Mild tightness posterior knee from HS and calf REHAB POTENTIAL: Good  CLINICAL DECISION MAKING: Stable/uncomplicated  EVALUATION COMPLEXITY: Low   GOALS: Goals reviewed with patient? No  SHORT TERM GOALS: Target date: 03/12/22  Patient will be independent with initial HEP. Goal status:met   LONG TERM GOALS: Target date: 04/23/22  Patient will be independent with advanced/ongoing HEP to improve outcomes and carryover.  Goal status: ongoing  2.  Patient will report at least 75% improvement in R knee pain to improve QOL. Baseline: 3/10 pain Goal status: partially met  3.  Patient will demonstrate improved R knee AROM to >/= 5-120 deg to allow for normal gait and stair mechanics. Baseline: 15-0-88 Goal status: partially met  4.  Patient will demonstrate improved functional RLE  strength as demonstrated by 5/5. Goal status: progressing  5.  Patient will be able to ambulate 600' with normal gait pattern without increased pain to access community.  Baseline: walking with RW Goal status: partially met  6. Patient will be able to ascend/descend stairs with 1 HR and reciprocal step pattern safely to access home and community.  Baseline: using cane to navigate stairs Goal status: progressing  7.  Patient will report 19 on FOTO to demonstrate improved functional ability. Baseline: 35 at eval, 54 03/05/22 Goal status: partially met  PLAN: PT FREQUENCY: 2x/week  PT DURATION: 12 weeks  PLANNED INTERVENTIONS: Therapeutic exercises, Therapeutic activity, Neuromuscular re-education, Balance training, Gait training, Patient/Family education, Self Care, Joint mobilization, Stair training, Electrical stimulation, Cryotherapy, Taping, Vasopneumatic device, Ionotophoresis 65m/ml Dexamethasone, and Manual therapy  PLAN FOR NEXT SESSION: Patient having issues with the right hip, possibly the piriformis is tight and in spasm   ASumner Boast PT 03/05/2022, 9:33 AM

## 2022-03-06 ENCOUNTER — Ambulatory Visit (INDEPENDENT_AMBULATORY_CARE_PROVIDER_SITE_OTHER): Payer: Medicare Other

## 2022-03-06 DIAGNOSIS — J309 Allergic rhinitis, unspecified: Secondary | ICD-10-CM | POA: Diagnosis not present

## 2022-03-07 ENCOUNTER — Telehealth: Payer: Self-pay | Admitting: Primary Care

## 2022-03-07 NOTE — Telephone Encounter (Signed)
Called patient back to go over the recommendations from beth. And he states that he is very confused now. He states why did Dr Halford Chessman state that he has no cardiac abnormalities noted but on the sleep study it states that he had abnormal arrhythmias noted. He would like more details of why he is getting two different reports. I advised him that I would ask Dr Halford Chessman to explain further if possible and that I would also send it to [facility] as well.   He would like a call back when we can tell him why he is getting two different response.  Please advise

## 2022-03-07 NOTE — Telephone Encounter (Signed)
Noted will route back to triage so information can be printed and faxed as I am working off campus today.

## 2022-03-07 NOTE — Telephone Encounter (Signed)
Called and spoke with patient. He stated that since he had called, he received his results in the mail. He wanted to go over the results. He had a question about the results on page 12. "EKG was noted as NSR with abnormal arrhythmias noted in epochs 102, 341...). I advised him that it would be best for him to schedule an appt to discuss the results. He verbalized understanding but still wanted to know if Arthur Moore could explain what above sentence meant.   Beth, can you please advise? Thanks!

## 2022-03-07 NOTE — Telephone Encounter (Signed)
You can send him a copy of sleep study interpretation from Dr. Halford Chessman on 02/14/22. He did not note any cardiac abnormalities. "The 2 lead EKG demonstrated sinus rhythm. The mean heart rate was 59.3 beats per minute. Other EKG findings include: None."

## 2022-03-09 ENCOUNTER — Encounter (HOSPITAL_BASED_OUTPATIENT_CLINIC_OR_DEPARTMENT_OTHER): Payer: Self-pay | Admitting: *Deleted

## 2022-03-09 ENCOUNTER — Emergency Department (HOSPITAL_BASED_OUTPATIENT_CLINIC_OR_DEPARTMENT_OTHER): Payer: Medicare Other

## 2022-03-09 ENCOUNTER — Emergency Department (HOSPITAL_BASED_OUTPATIENT_CLINIC_OR_DEPARTMENT_OTHER)
Admission: EM | Admit: 2022-03-09 | Discharge: 2022-03-10 | Disposition: A | Discharge status: Home / Self Care | Payer: Medicare Other | Attending: Emergency Medicine | Admitting: Emergency Medicine

## 2022-03-09 ENCOUNTER — Other Ambulatory Visit: Payer: Self-pay

## 2022-03-09 DIAGNOSIS — Z79899 Other long term (current) drug therapy: Secondary | ICD-10-CM | POA: Diagnosis not present

## 2022-03-09 DIAGNOSIS — J45909 Unspecified asthma, uncomplicated: Secondary | ICD-10-CM | POA: Diagnosis not present

## 2022-03-09 DIAGNOSIS — R519 Headache, unspecified: Secondary | ICD-10-CM | POA: Diagnosis not present

## 2022-03-09 DIAGNOSIS — Z7982 Long term (current) use of aspirin: Secondary | ICD-10-CM | POA: Diagnosis not present

## 2022-03-09 DIAGNOSIS — R42 Dizziness and giddiness: Secondary | ICD-10-CM | POA: Insufficient documentation

## 2022-03-09 DIAGNOSIS — Z7951 Long term (current) use of inhaled steroids: Secondary | ICD-10-CM | POA: Diagnosis not present

## 2022-03-09 NOTE — ED Triage Notes (Signed)
Pt has had headaches on and off "for months" and is getting an outpatient CT scan this Friday due to this which was ordered by his PCP.  Pt has had intermittent dizziness with this for "several months" as well.  Pt states that today he noted he was having HA and dizziness while at rest and he checked his BP and became anxious as it was 170/90.  Pt took a prn lorazepam pta.  BP 147/85 in triage.  Pt is having HA 7-8/10 and dizziness.  No focal weakness or facial droop. Speech clear, pt is alert and oriented.

## 2022-03-10 ENCOUNTER — Ambulatory Visit: Payer: Medicare Other | Admitting: Physical Therapy

## 2022-03-10 ENCOUNTER — Encounter: Payer: Self-pay | Admitting: Physical Therapy

## 2022-03-10 DIAGNOSIS — R600 Localized edema: Secondary | ICD-10-CM | POA: Diagnosis not present

## 2022-03-10 DIAGNOSIS — M25561 Pain in right knee: Secondary | ICD-10-CM | POA: Diagnosis not present

## 2022-03-10 DIAGNOSIS — R262 Difficulty in walking, not elsewhere classified: Secondary | ICD-10-CM

## 2022-03-10 DIAGNOSIS — M25661 Stiffness of right knee, not elsewhere classified: Secondary | ICD-10-CM

## 2022-03-10 DIAGNOSIS — R519 Headache, unspecified: Secondary | ICD-10-CM | POA: Diagnosis not present

## 2022-03-10 LAB — CBC WITH DIFFERENTIAL/PLATELET
Abs Immature Granulocytes: 0.01 10*3/uL (ref 0.00–0.07)
Basophils Absolute: 0 10*3/uL (ref 0.0–0.1)
Basophils Relative: 1 %
Eosinophils Absolute: 0.3 10*3/uL (ref 0.0–0.5)
Eosinophils Relative: 4 %
HCT: 43.2 % (ref 39.0–52.0)
Hemoglobin: 15.1 g/dL (ref 13.0–17.0)
Immature Granulocytes: 0 %
Lymphocytes Relative: 23 %
Lymphs Abs: 1.6 10*3/uL (ref 0.7–4.0)
MCH: 30.8 pg (ref 26.0–34.0)
MCHC: 35 g/dL (ref 30.0–36.0)
MCV: 88.2 fL (ref 80.0–100.0)
Monocytes Absolute: 0.5 10*3/uL (ref 0.1–1.0)
Monocytes Relative: 7 %
Neutro Abs: 4.7 10*3/uL (ref 1.7–7.7)
Neutrophils Relative %: 65 %
Platelets: 136 10*3/uL — ABNORMAL LOW (ref 150–400)
RBC: 4.9 MIL/uL (ref 4.22–5.81)
RDW: 12.7 % (ref 11.5–15.5)
WBC: 7.1 10*3/uL (ref 4.0–10.5)
nRBC: 0 % (ref 0.0–0.2)

## 2022-03-10 LAB — BASIC METABOLIC PANEL
Anion gap: 7 (ref 5–15)
BUN: 24 mg/dL — ABNORMAL HIGH (ref 8–23)
CO2: 23 mmol/L (ref 22–32)
Calcium: 8.7 mg/dL — ABNORMAL LOW (ref 8.9–10.3)
Chloride: 108 mmol/L (ref 98–111)
Creatinine, Ser: 1.24 mg/dL (ref 0.61–1.24)
GFR, Estimated: >60 mL/min (ref >60)
Glucose, Bld: 121 mg/dL — ABNORMAL HIGH (ref 70–99)
Potassium: 3.8 mmol/L (ref 3.5–5.1)
Sodium: 138 mmol/L (ref 135–145)

## 2022-03-10 MED ORDER — MECLIZINE HCL 25 MG PO TABS
25.0000 mg | ORAL_TABLET | Freq: Once | ORAL | Status: AC
  Administered 2022-03-10: 25 mg via ORAL
  Filled 2022-03-10: qty 1

## 2022-03-10 MED ORDER — MECLIZINE HCL 25 MG PO TABS: 25.0000 mg | ORAL_TABLET | Freq: Three times a day (TID) | ORAL | 0 refills | Status: DC | PRN

## 2022-03-10 NOTE — Telephone Encounter (Signed)
I tried calling patient.  Please let him know the "arrhythmia" he is referring to is a comment by the sleep lab therapist.  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  The sleep lab therapist can't comment on what the finding is since this can only be done by the physician.  The sleep lab therapist mentions these in there report to alert the physician to review it.  I reviewed the study, and there was no clinically significant heart rhythm abnormalities.  The official report that I signed is the accurate reflection of what the sleep study showed, and not the sleep therapist's report.

## 2022-03-10 NOTE — ED Notes (Signed)
Pt ambulates to the bathroom.

## 2022-03-10 NOTE — ED Provider Notes (Signed)
[facility] EMERGENCY DEPARTMENT Provider Note   CSN: [id number] Arrival date & time: 03/09/22  2156     History  Chief Complaint  Patient presents with   Headache   Dizziness    Arthur Moore is a 71 y.o. male.  Patient is a 71 year old male with past medical history of asthma, chronic renal insufficiency, coronary artery disease, hyperlipidemia.  Patient presenting today with complaints of dizziness and headache.  He reports headaches that have been occurring off and on for months, then recently began feeling off balance.  He has seen his primary doctor for this and is scheduled for a CT scan this Friday.  He presents today for evaluation.  His blood pressure at home was 170/90 and he was somewhat concerned about this as well.  There are no aggravating or alleviating factors.  The history is provided by the patient.       Home Medications Prior to Admission medications   Medication Sig Start Date End Date Taking? Authorizing Provider  albuterol (VENTOLIN HFA) 108 (90 Base) MCG/ACT inhaler Inhale 2 puffs into the lungs every 4 (four) hours as needed for wheezing or shortness of breath. 12/20/21   Kozlow, Donnamarie Poag, MD  aspirin EC 81 MG tablet Take 81 mg by mouth daily.    [provider]  budesonide (RHINOCORT AQUA) 32 MCG/ACT nasal spray Use two sprays in each nostril once daily Patient not taking: Reported on 02/12/2022 11/12/21   Kozlow, Donnamarie Poag, MD  budesonide-formoterol (SYMBICORT) 160-4.5 MCG/ACT inhaler Inhale 2 puffs into the lungs 2 (two) times daily. 12/11/21   Kozlow, Donnamarie Poag, MD  busPIRone (BUSPAR) 15 MG tablet Take 1 tablet (15 mg total) by mouth 3 (three) times daily. Patient taking differently: Take 15 mg by mouth daily. 08/27/21   Mozingo, Berdie Ogren, NP  cetirizine (ZYRTEC ALLERGY) 10 MG tablet Take 1 tablet (10 mg total) by mouth daily as needed (can take an ectra dose during flare ups). 12/20/21   Kozlow, Donnamarie Poag, MD  Cholecalciferol (VITAMIN  D3) 5000 units TABS Take 5,000 Units by mouth daily.     [provider]  fluticasone (FLONASE) 50 MCG/ACT nasal spray 1-2 sprays each nostril 1 (ONE) time per day. 12/20/21   Kozlow, Donnamarie Poag, MD  lamoTRIgine (LAMICTAL) 200 MG tablet Take 1 tablet (200 mg total) by mouth 2 (two) times daily. 02/05/22   Mozingo, Berdie Ogren, NP  LORazepam (ATIVAN) 0.5 MG tablet Take 1 tablet (0.5 mg total) by mouth 3 (three) times daily as needed for anxiety. 02/07/22   Mozingo, Berdie Ogren, NP  metoprolol tartrate (LOPRESSOR) 25 MG tablet TAKE 1/2 TABLET BY MOUTH TWICE A DAY 01/31/21   Skeet Latch, MD  Multiple Vitamin (MULTIVITAMIN) capsule Take 1 capsule by mouth daily. Patient not taking: Reported on 02/12/2022    [provider]  nitroGLYCERIN (NITROSTAT) 0.4 MG SL tablet Place 1 tablet (0.4 mg total) under the tongue every 5 (five) minutes as needed for chest pain. 05/13/19 11/08/21  Deberah Pelton, NP  pantoprazole (PROTONIX) 40 MG tablet Take 1 tablet by mouth 1-2 times per day. 12/20/21   Kozlow, Donnamarie Poag, MD  Probiotic Product (PROBIOTIC DAILY PO) Take 1 tablet by mouth daily. Patient not taking: Reported on 02/12/2022    [provider]  rosuvastatin (CRESTOR) 10 MG tablet Take 1 tablet by mouth daily.    [provider]  zinc gluconate 50 MG tablet Take 1 tablet (50 mg total) by mouth daily. 07/26/18  Lendon Colonel, NP      Allergies    Atorvastatin and Zolpidem    Review of Systems   Review of Systems  All other systems reviewed and are negative.   Physical Exam Updated Vital Signs BP (!) 151/86 (BP Location: Right Arm)   Pulse 60   Temp (!) 97.5 F (36.4 C) (Oral)   Resp 14   SpO2 98%  Physical Exam Vitals and nursing note reviewed.  Constitutional:      General: He is not in acute distress.    Appearance: He is well-developed. He is not diaphoretic.  HENT:     Head: Normocephalic and atraumatic.  Eyes:     Extraocular Movements:  Extraocular movements intact.     Pupils: Pupils are equal, round, and reactive to light.  Cardiovascular:     Rate and Rhythm: Normal rate and regular rhythm.     Heart sounds: No murmur heard.    No friction rub.  Pulmonary:     Effort: Pulmonary effort is normal. No respiratory distress.     Breath sounds: Normal breath sounds. No wheezing or rales.  Abdominal:     General: Bowel sounds are normal. There is no distension.     Palpations: Abdomen is soft.     Tenderness: There is no abdominal tenderness.  Musculoskeletal:        General: Normal range of motion.     Cervical back: Normal range of motion and neck supple.  Skin:    General: Skin is warm and dry.  Neurological:     Mental Status: He is alert and oriented to person, place, and time.     Cranial Nerves: No cranial nerve deficit.     Coordination: Coordination normal.     ED Results / Procedures / Treatments   Labs (all labs ordered are listed, but only abnormal results are displayed) Labs Reviewed  BASIC METABOLIC PANEL  CBC WITH DIFFERENTIAL/PLATELET    EKG None  Radiology CT Head Wo Contrast  Result Date: 03/09/2022 CLINICAL DATA:  Dizziness, non-specific EXAM: CT HEAD WITHOUT CONTRAST TECHNIQUE: Contiguous axial images were obtained from the base of the skull through the vertex without intravenous contrast. RADIATION DOSE REDUCTION: This exam was performed according to the departmental dose-optimization program which includes automated exposure control, adjustment of the mA and/or kV according to patient size and/or use of iterative reconstruction technique. COMPARISON:  CT head 04/14/2021 FINDINGS: Brain: Patchy and confluent areas of decreased attenuation are noted throughout the deep and periventricular white matter of the cerebral hemispheres bilaterally, compatible with chronic microvascular ischemic disease. No evidence of large-territorial acute infarction. No parenchymal hemorrhage. No mass lesion. No  extra-axial collection. No mass effect or midline shift. No hydrocephalus. Basilar cisterns are patent. Vascular: No hyperdense vessel. Atherosclerotic calcifications are present within the cavernous internal carotid and vertebral arteries. Skull: No acute fracture or focal lesion. Sinuses/Orbits: Paranasal sinuses and mastoid air cells are clear. The orbits are unremarkable. Other: None. IMPRESSION: No acute intracranial abnormality. Electronically Signed   By: Iven Finn M.D.   On: 03/09/2022 22:47    Procedures Procedures    Medications Ordered in ED Medications  meclizine (ANTIVERT) tablet 25 mg (has no administration in time range)    ED Course/ Medical Decision Making/ A&P  Patient presenting here with complaints of dizziness as described in the HPI.  He arrives here with stable vital signs and physical examination which is unremarkable.  Neurologic exam is nonfocal and heart and lung  exam is normal.  Work-up initiated including CBC and metabolic panel.  These were both unremarkable.  I also obtained a CT scan of the head showing no evidence for stroke or other abnormality.  At this point, symptoms sound like peripheral vertigo or labyrinthitis.  He was given meclizine with good results.  With no emergent pathology identified in the work-up, I feel as though he can safely be discharged with meclizine and follow-up with primary doctor.  Final Clinical Impression(s) / ED Diagnoses Final diagnoses:  None    Rx / DC Orders ED Discharge Orders     None         Veryl Speak, MD 03/10/22 [phone]

## 2022-03-10 NOTE — Telephone Encounter (Signed)
Can we give this patient a call to see if he wants to set up an appointment with Beth to go over the sleep study issues.   It can be mychart or in office.   Thank you

## 2022-03-10 NOTE — ED Notes (Signed)
Vertigo is decreased, no distress at this time

## 2022-03-10 NOTE — Discharge Instructions (Addendum)
Begin taking meclizine as prescribed.  Follow-up with primary doctor if symptoms or not improving in the next week.

## 2022-03-10 NOTE — Telephone Encounter (Signed)
Pt called back.  Discussed the findings of his sleep study.  No significant sleep apnea, oxygen desaturation or arrhythmia.

## 2022-03-10 NOTE — Telephone Encounter (Signed)
Have him schedule a visit. I am going by what the sleep doctor read and his interpretation. The report he is seeing is from [employer]. I do not see any cardiac abnormalities that he needs to worry about.

## 2022-03-10 NOTE — Therapy (Signed)
OUTPATIENT PHYSICAL THERAPY LOWER EXTREMITY TREATMENT   Patient Name: Arthur Moore MRN: [id number] DOB:07/13/1950, 71 y.o., male Today's Date: 03/10/2022   PT End of Session - 03/10/22 1406     Visit Number 12    Date for PT Re-Evaluation 04/23/22    Authorization Type Medicare    PT Start Time 1347    PT Stop Time 1449    PT Time Calculation (min) 62 min    Activity Tolerance Patient tolerated treatment well    Behavior During Therapy [facility] for tasks assessed/performed               Past Medical History:  Diagnosis Date   Aortic insufficiency    Arthritis    Asthma    Carotid bruit 07/09/2021   Cervical disc disease    Depression    Diastolic dysfunction 7/34/1937   Hyperlipidemia 08/24/2015   Hypertension    Lumbar disc disease    Psoriasis    Past Surgical History:  Procedure Laterality Date   CHOLECYSTECTOMY  2012   HERNIA REPAIR     LEFT HEART CATH AND CORONARY ANGIOGRAPHY N/A 07/08/2018   Procedure: LEFT HEART CATH AND CORONARY ANGIOGRAPHY;  Surgeon: Burnell Blanks, MD;  Location: [facility];  Service: Cardiovascular;  Laterality: N/A;   PAROTIDECTOMY Right 1992   PROSTATE SURGERY  2002, 2004   [place]     SINUS EXPLORATION  2013   SPINE SURGERY  2013   L C7-T1 laminectomy and discectomy   TONSILLECTOMY     Patient Active Problem List   Diagnosis Date Noted   Snoring 02/14/2022   Mild sleep apnea 02/12/2022   Carotid bruit 07/09/2021   Aneurysm of ascending aorta ([place]) 06/28/2020   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   CAD (coronary artery disease) 07/09/2018   CKD (chronic kidney disease), 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   Chest pain of uncertain etiology 90/24/0973   Chronic cough 06/21/2018   Thrombocytopenia ([place]) 12/01/2016   Transaminitis 53/29/9242   Diastolic dysfunction 68/34/1962   MDD (major depressive disorder), recurrent severe, without psychosis ([place]) 10/13/2015   Hyperlipidemia 08/24/2015    Arthritis    Depression    Hypertension    Aortic insufficiency    Psoriasis    Cervical disc disease    Lumbar disc disease    Asthma 01/06/2015   Psoriatic arthritis ([place]) 01/06/2015   Allergic rhinoconjunctivitis 01/06/2015   GERD (gastroesophageal reflux disease) 01/06/2015   Laryngopharyngeal reflux (LPR) 01/06/2015    PCP: No PCP  REFERRING PROVIDER: Victorino December  REFERRING DIAG: Z47.89   THERAPY DIAG:  Acute pain of right knee  Localized edema  Stiffness of right knee, not elsewhere classified  Difficulty in walking, not elsewhere classified  Rationale for Evaluation and Treatment Rehabilitation  ONSET DATE: 12/26/21  SUBJECTIVE:   SUBJECTIVE STATEMENT: Reports less hip pain today, jsut feels very stiff PERTINENT HISTORY: R meniscus repait post op 7 weeks  PAIN:  Are you having pain? Yes: NPRS scale: 5/10 Pain location: R knee  Reports that he started having some right hip soreness 7/10 Pain description: achy, throbbing  Aggravating factors: walking Relieving factors: ice, tylenol   PRECAUTIONS: None  WEIGHT BEARING RESTRICTIONS No  FALLS:  Has patient fallen in last 6 months? No  LIVING ENVIRONMENT: Lives with: lives with their spouse Lives in: House/apartment Stairs: Yes: Internal: 12 steps; on right going up Has following equipment at home: Single point cane and Walker - 2 wheeled  OCCUPATION: Retired  PLOF: Independent  PATIENT GOALS go out and play pickleball    OBJECTIVE:   PATIENT SURVEYS:  FOTO 35/100  COGNITION:  Overall cognitive status: Within functional limits for tasks assessed     SENSATION: WFL   MUSCLE LENGTH: Hamstrings: Right tightness limiting ROM   POSTURE: No Significant postural limitations and rounded shoulders  PALPATION: No TTP, some swelling in R knee   LOWER EXTREMITY ROM:  Active ROM Right eval 02/07/22 03/03/22 03/10/22  Hip flexion      Hip extension      Hip abduction      Hip adduction       Hip internal rotation      Hip external rotation      Knee flexion 88 105 120 120  Knee extension -15 -_0 Ankle dorsiflexion      Ankle plantarflexion      Ankle inversion      Ankle eversion       (Blank rows = not tested)  LOWER EXTREMITY MMT:  MMT Right eval Left eval  Hip flexion 4   Hip extension    Hip abduction    Hip adduction    Hip internal rotation    Hip external rotation    Knee flexion 4   Knee extension 3+ w/pain   Ankle dorsiflexion    Ankle plantarflexion    Ankle inversion    Ankle eversion     (Blank rows = not tested)  FUNCTIONAL TESTS:  5 times sit to stand: 18.89s Timed up and go (TUG): 17.53s  02/24/22 12 seconds  GAIT: Distance walked: 43f Assistive device utilized: WEnvironmental consultant- 2 wheeled and able to do TUG without AD  Level of assistance: Modified independence Comments: decreased stance time on RLE, decreased step length, antalgic gait, decreased R knee flexion,     TODAY'S TREATMENT: 03/10/22 Bike level 4 x 6 minutes Gait outside 1 lap had a limp down the slope Leg extension end range focus on TKE/VMO Leg press 40# 2x10.  Right only 20# 2x10 50# resisted gait all directions PROM to the right knee and hip Vaso right knee in elevation medium pressure  03/05/22 Bike level 4 x 6 minutes Gait one lap outside had more of a limp down hill Leg curls 35# both legs 2x10, 20# right only Leg extension right only 5#  Leg press 40$ 2x10 both, 2x10 right only  Passive HS, piriformis stretches MHP/IFC to the right hip area   03/03/22 Bike level 4 x 6 minutes Calf stretches Leg press 50# both legs 2x10 Leg press 30# right only 2x10 Leg extension right eccentrics 2x10 5# Leg curls 15# 2x10 right only Resisted gait all directions Passive ROM into extension, calf and hip stretch Vaso right knee medium pressure   02/26/22 Bike level 4 x 6 minutes Calf stretches HS curls 25# 2x10 both legs then right only 15# 2x10 LEg extension 5#  3x10 with eccentric on the right Resisted gait all directions Feet on ball K2C, trunk rotation, small bridges, isometric abs Passive Knee extension stretch, HS and hip stretches Vaso right knee medium pressure STM tot he right hip    PATIENT EDUCATION:  Education details: POC Person educated: Patient Education method: Explanation Education comprehension: verbalized understanding   HOME EXERCISE PROGRAM: LAQ, SAQ, knee flexion [place]  CLINICAL IMPRESSION: Patient continues to do well with his ROM.  He lacks about 5 degrees of TKE, he c/o stiffness in the  right knee and has had issues with his right hip and we had a set back with this due to pain, seemed to be muscular in nature.  He continues to have a limp and hold the right knee stiff with walking especially going down hill, and this may be a habit as he does not report pain and we just did the bike before this with good ROM. REHAB POTENTIAL: Good  CLINICAL DECISION MAKING: Stable/uncomplicated  EVALUATION COMPLEXITY: Low   GOALS: Goals reviewed with patient? No  SHORT TERM GOALS: Target date: 03/12/22  Patient will be independent with initial HEP. Goal status:met   LONG TERM GOALS: Target date: 04/23/22  Patient will be independent with advanced/ongoing HEP to improve outcomes and carryover.  Goal status: ongoing  2.  Patient will report at least 75% improvement in R knee pain to improve QOL. Baseline: 3/10 pain Goal status: partially met  3.  Patient will demonstrate improved R knee AROM to >/= 5-120 deg to allow for normal gait and stair mechanics. Baseline: 15-0-88 Goal status: met at 5-120 degrees flexion  4.  Patient will demonstrate improved functional RLE strength as demonstrated by 5/5. Goal status: progressing  5.  Patient will be able to ambulate 600' with normal gait pattern without increased pain to access community.  Baseline: walking with RW Goal status:  partially met  6. Patient will be able to ascend/descend stairs with 1 HR and reciprocal step pattern safely to access home and community.  Baseline: using cane to navigate stairs Goal status: progressing  7.  Patient will report 82 on FOTO to demonstrate improved functional ability. Baseline: 35 at eval, 54 03/05/22 Goal status: partially met  PLAN: PT FREQUENCY: 2x/week  PT DURATION: 12 weeks  PLANNED INTERVENTIONS: Therapeutic exercises, Therapeutic activity, Neuromuscular re-education, Balance training, Gait training, Patient/Family education, Self Care, Joint mobilization, Stair training, Electrical stimulation, Cryotherapy, Taping, Vasopneumatic device, Ionotophoresis 82m/ml Dexamethasone, and Manual therapy  PLAN FOR NEXT SESSION: Patient having issues with the right hip, possibly the piriformis is tight and in spasm   ASumner Boast PT 03/10/2022, 2:07 PM

## 2022-03-11 ENCOUNTER — Encounter (HOSPITAL_BASED_OUTPATIENT_CLINIC_OR_DEPARTMENT_OTHER): Payer: Self-pay | Admitting: Cardiovascular Disease

## 2022-03-11 NOTE — Telephone Encounter (Signed)
Please advise 

## 2022-03-11 NOTE — Telephone Encounter (Signed)
Called in separate encounter by Alvina Filbert, LPN

## 2022-03-11 NOTE — Telephone Encounter (Signed)
Discussed with patient and scheduled follow up in March

## 2022-03-12 ENCOUNTER — Ambulatory Visit: Payer: Medicare Other | Admitting: Physical Therapy

## 2022-03-12 DIAGNOSIS — N3943 Post-void dribbling: Secondary | ICD-10-CM | POA: Diagnosis not present

## 2022-03-12 DIAGNOSIS — N281 Cyst of kidney, acquired: Secondary | ICD-10-CM | POA: Diagnosis not present

## 2022-03-12 DIAGNOSIS — R350 Frequency of micturition: Secondary | ICD-10-CM | POA: Diagnosis not present

## 2022-03-13 ENCOUNTER — Ambulatory Visit: Payer: Medicare Other | Admitting: Physical Therapy

## 2022-03-13 ENCOUNTER — Ambulatory Visit (INDEPENDENT_AMBULATORY_CARE_PROVIDER_SITE_OTHER): Payer: Medicare Other

## 2022-03-13 DIAGNOSIS — R600 Localized edema: Secondary | ICD-10-CM | POA: Diagnosis not present

## 2022-03-13 DIAGNOSIS — R262 Difficulty in walking, not elsewhere classified: Secondary | ICD-10-CM | POA: Diagnosis not present

## 2022-03-13 DIAGNOSIS — M25661 Stiffness of right knee, not elsewhere classified: Secondary | ICD-10-CM

## 2022-03-13 DIAGNOSIS — M25561 Pain in right knee: Secondary | ICD-10-CM | POA: Diagnosis not present

## 2022-03-13 DIAGNOSIS — J309 Allergic rhinitis, unspecified: Secondary | ICD-10-CM | POA: Diagnosis not present

## 2022-03-13 NOTE — Therapy (Signed)
OUTPATIENT PHYSICAL THERAPY LOWER EXTREMITY TREATMENT   Patient Name: Arthur Moore MRN: [id number] DOB:March 21, 1951, 71 y.o., male Today's Date: 03/13/2022   PT End of Session - 03/13/22 0932     Visit Number 13    Date for PT Re-Evaluation 04/23/22    PT Start Time 0932    PT Stop Time 1010    PT Time Calculation (min) 38 min    Activity Tolerance Patient tolerated treatment well    Behavior During Therapy [facility] for tasks assessed/performed                Past Medical History:  Diagnosis Date   Aortic insufficiency    Arthritis    Asthma    Carotid bruit 07/09/2021   Cervical disc disease    Depression    Diastolic dysfunction 8/34/1962   Hyperlipidemia 08/24/2015   Hypertension    Lumbar disc disease    Psoriasis    Past Surgical History:  Procedure Laterality Date   CHOLECYSTECTOMY  2012   HERNIA REPAIR     LEFT HEART CATH AND CORONARY ANGIOGRAPHY N/A 07/08/2018   Procedure: LEFT HEART CATH AND CORONARY ANGIOGRAPHY;  Surgeon: Burnell Blanks, MD;  Location: [place];  Service: Cardiovascular;  Laterality: N/A;   PAROTIDECTOMY Right 1992   PROSTATE SURGERY  2002, 2004   [place]     SINUS EXPLORATION  2013   SPINE SURGERY  2013   L C7-T1 laminectomy and discectomy   TONSILLECTOMY     Patient Active Problem List   Diagnosis Date Noted   Snoring 02/14/2022   Mild sleep apnea 02/12/2022   Carotid bruit 07/09/2021   Aneurysm of ascending aorta ([place]) 06/28/2020   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   CAD (coronary artery disease) 07/09/2018   CKD (chronic kidney disease), 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   Chest pain of uncertain etiology 22/97/9892   Chronic cough 06/21/2018   Thrombocytopenia ([place]) 12/01/2016   Transaminitis 11/94/1740   Diastolic dysfunction 81/44/8185   MDD (major depressive disorder), recurrent severe, without psychosis ([place]) 10/13/2015   Hyperlipidemia 08/24/2015   Arthritis    Depression     Hypertension    Aortic insufficiency    Psoriasis    Cervical disc disease    Lumbar disc disease    Asthma 01/06/2015   Psoriatic arthritis ([place]) 01/06/2015   Allergic rhinoconjunctivitis 01/06/2015   GERD (gastroesophageal reflux disease) 01/06/2015   Laryngopharyngeal reflux (LPR) 01/06/2015    PCP: No PCP  REFERRING PROVIDER: Victorino December  REFERRING DIAG: Z47.89   THERAPY DIAG:  Acute pain of right knee  Localized edema  Stiffness of right knee, not elsewhere classified  Difficulty in walking, not elsewhere classified  Rationale for Evaluation and Treatment Rehabilitation  ONSET DATE: 12/26/21  SUBJECTIVE:   SUBJECTIVE STATEMENT: Reports less hip pain today, jsut feels very stiff PERTINENT HISTORY: R meniscus repait post op 7 weeks  PAIN:  Are you having pain? Yes: NPRS scale: 5/10 Pain location: R knee  Reports that he started having some right hip soreness 7/10 Pain description: achy, throbbing  Aggravating factors: walking Relieving factors: ice, tylenol   PRECAUTIONS: None  WEIGHT BEARING RESTRICTIONS No  FALLS:  Has patient fallen in last 6 months? No  LIVING ENVIRONMENT: Lives with: lives with their spouse Lives in: House/apartment Stairs: Yes: Internal: 12 steps; on right going up Has following equipment at home: Single point cane and Walker - 2 wheeled  OCCUPATION: Retired  PLOF: [place]  go out and play pickleball    OBJECTIVE:   PATIENT SURVEYS:  FOTO 35/100  COGNITION:  Overall cognitive status: Within functional limits for tasks assessed     SENSATION: WFL   MUSCLE LENGTH: Hamstrings: Right tightness limiting ROM   POSTURE: No Significant postural limitations and rounded shoulders  PALPATION: No TTP, some swelling in R knee   LOWER EXTREMITY ROM:  Active ROM Right eval 02/07/22 03/03/22 03/10/22  Hip flexion      Hip extension      Hip abduction      Hip adduction      Hip internal rotation       Hip external rotation      Knee flexion 88 105 120 120  Knee extension -15 -_0 Ankle dorsiflexion      Ankle plantarflexion      Ankle inversion      Ankle eversion       (Blank rows = not tested)  LOWER EXTREMITY MMT:  MMT Right eval Left eval  Hip flexion 4   Hip extension    Hip abduction    Hip adduction    Hip internal rotation    Hip external rotation    Knee flexion 4   Knee extension 3+ w/pain   Ankle dorsiflexion    Ankle plantarflexion    Ankle inversion    Ankle eversion     (Blank rows = not tested)  FUNCTIONAL TESTS:  5 times sit to stand: 18.89s Timed up and go (TUG): 17.53s  02/24/22 12 seconds  GAIT: Distance walked: 79f Assistive device utilized: WEnvironmental consultant- 2 wheeled and able to do TUG without AD  Level of assistance: Modified independence Comments: decreased stance time on RLE, decreased step length, antalgic gait, decreased R knee flexion,     TODAY'S TREATMENT: 03/13/22 Bike L4.3 x 6 minutes STM with tennis ball to R piriformis, with stretch, F/B clamshells against G tband resistance Supine hip strengthening-clamshells and bridging with G tband resistance to engage abductors, U bridge x 5 reps each limb Standing TKE with ball x 10 reps Ambualted outdoors on unlevel surfaces with improved stride, normalized pattern on R, including downhill.   03/10/22 Bike level 4 x 6 minutes Gait outside 1 lap had a limp down the slope Leg extension end range focus on TKE/VMO Leg press 40# 2x10.  Right only 20# 2x10 50# resisted gait all directions PROM to the right knee and hip Vaso right knee in elevation medium pressure  03/05/22 Bike level 4 x 6 minutes Gait one lap outside had more of a limp down hill Leg curls 35# both legs 2x10, 20# right only Leg extension right only 5#  Leg press 40$ 2x10 both, 2x10 right only  Passive HS, piriformis stretches MHP/IFC to the right hip area   03/03/22 Bike level 4 x 6 minutes Calf stretches Leg  press 50# both legs 2x10 Leg press 30# right only 2x10 Leg extension right eccentrics 2x10 5# Leg curls 15# 2x10 right only Resisted gait all directions Passive ROM into extension, calf and hip stretch Vaso right knee medium pressure   02/26/22 Bike level 4 x 6 minutes Calf stretches HS curls 25# 2x10 both legs then right only 15# 2x10 LEg extension 5# 3x10 with eccentric on the right Resisted gait all directions Feet on ball K2C, trunk rotation, small bridges, isometric abs Passive Knee extension stretch, HS and hip stretches Vaso right knee medium pressure STM tot he right hip  PATIENT EDUCATION:  Education details: POC Person educated: Patient Education method: Explanation Education comprehension: verbalized understanding   HOME EXERCISE PROGRAM: LAQ, SAQ, knee flexion seated- given from [place]:  CLINICAL IMPRESSION: Patient reports that his Dr instructed him in TO during gait and HS to normalize his RLE in gait. Continued emphasis on TKE strength and ROM, also performed STM and stretch/strengthen to R piriformis. Updated HEP to address strength needs. Need to still add additional strengthening for calves, stretching for hips and heel cords. REHAB POTENTIAL: Good  CLINICAL DECISION MAKING: Stable/uncomplicated  EVALUATION COMPLEXITY: Low   GOALS: Goals reviewed with patient? No  SHORT TERM GOALS: Target date: 03/12/22  Patient will be independent with initial HEP. Goal status:met   LONG TERM GOALS: Target date: 04/23/22  Patient will be independent with advanced/ongoing HEP to improve outcomes and carryover.  Goal status: ongoing  2.  Patient will report at least 75% improvement in R knee pain to improve QOL. Baseline: 3/10 pain Goal status: partially met  3.  Patient will demonstrate improved R knee AROM to >/= 5-120 deg to allow for normal gait and stair mechanics. Baseline: 15-0-88 Goal status: met at 5-120 degrees flexion  4.   Patient will demonstrate improved functional RLE strength as demonstrated by 5/5. Goal status: progressing  5.  Patient will be able to ambulate 600' with normal gait pattern without increased pain to access community.  Baseline: walking with RW Goal status: partially met  6. Patient will be able to ascend/descend stairs with 1 HR and reciprocal step pattern safely to access home and community.  Baseline: using cane to navigate stairs Goal status: progressing  7.  Patient will report 62 on FOTO to demonstrate improved functional ability. Baseline: 35 at eval, 54 03/05/22 Goal status: partially met  PLAN: PT FREQUENCY: 2x/week  PT DURATION: 12 weeks  PLANNED INTERVENTIONS: Therapeutic exercises, Therapeutic activity, Neuromuscular re-education, Balance training, Gait training, Patient/Family education, Self Care, Joint mobilization, Stair training, Electrical stimulation, Cryotherapy, Taping, Vasopneumatic device, Ionotophoresis 62m/ml Dexamethasone, and Manual therapy  PLAN FOR NEXT SESSION: Add heel cord stretch and heel raises to HEP. Add hip stretches-piriformis, gluts to [facility] PT 03/13/2022, 10:40 AM

## 2022-03-14 ENCOUNTER — Ambulatory Visit
Admission: RE | Admit: 2022-03-14 | Discharge: 2022-03-14 | Disposition: A | Discharge status: Home / Self Care | Payer: Medicare Other | Source: Ambulatory Visit | Attending: Family Medicine | Admitting: Family Medicine

## 2022-03-14 DIAGNOSIS — R519 Headache, unspecified: Secondary | ICD-10-CM

## 2022-03-14 DIAGNOSIS — R42 Dizziness and giddiness: Secondary | ICD-10-CM | POA: Diagnosis not present

## 2022-03-17 ENCOUNTER — Encounter (HOSPITAL_BASED_OUTPATIENT_CLINIC_OR_DEPARTMENT_OTHER): Payer: Self-pay | Admitting: Cardiovascular Disease

## 2022-03-17 ENCOUNTER — Ambulatory Visit: Payer: Medicare Other

## 2022-03-17 NOTE — Telephone Encounter (Signed)
Patient is calling requesting to speak with Rip Harbour concerning these patient messages. Please advise.

## 2022-03-17 NOTE — Therapy (Incomplete)
OUTPATIENT PHYSICAL THERAPY LOWER EXTREMITY TREATMENT   Patient Name: Arthur Moore MRN: [id number] DOB:02/13/1951, 71 y.o., male Today's Date: 03/13/2022   PT End of Session - 03/13/22 0932     Visit Number 13    Date for PT Re-Evaluation 04/23/22    PT Start Time 0932    PT Stop Time 1010    PT Time Calculation (min) 38 min    Activity Tolerance Patient tolerated treatment well    Behavior During Therapy [facility] for tasks assessed/performed                Past Medical History:  Diagnosis Date   Aortic insufficiency    Arthritis    Asthma    Carotid bruit 07/09/2021   Cervical disc disease    Depression    Diastolic dysfunction 7/37/1062   Hyperlipidemia 08/24/2015   Hypertension    Lumbar disc disease    Psoriasis    Past Surgical History:  Procedure Laterality Date   CHOLECYSTECTOMY  2012   HERNIA REPAIR     LEFT HEART CATH AND CORONARY ANGIOGRAPHY N/A 07/08/2018   Procedure: LEFT HEART CATH AND CORONARY ANGIOGRAPHY;  Surgeon: Burnell Blanks, MD;  Location: [facility];  Service: Cardiovascular;  Laterality: N/A;   PAROTIDECTOMY Right 1992   PROSTATE SURGERY  2002, 2004   [place]     SINUS EXPLORATION  2013   SPINE SURGERY  2013   L C7-T1 laminectomy and discectomy   TONSILLECTOMY     Patient Active Problem List   Diagnosis Date Noted   Snoring 02/14/2022   Mild sleep apnea 02/12/2022   Carotid bruit 07/09/2021   Aneurysm of ascending aorta ([place]) 06/28/2020   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   CAD (coronary artery disease) 07/09/2018   CKD (chronic kidney disease), 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   Chest pain of uncertain etiology 69/48/5462   Chronic cough 06/21/2018   Thrombocytopenia ([place]) 12/01/2016   Transaminitis 70/35/0093   Diastolic dysfunction 81/82/9937   MDD (major depressive disorder), recurrent severe, without psychosis ([place]) 10/13/2015   Hyperlipidemia 08/24/2015   Arthritis    Depression     Hypertension    Aortic insufficiency    Psoriasis    Cervical disc disease    Lumbar disc disease    Asthma 01/06/2015   Psoriatic arthritis ([place]) 01/06/2015   Allergic rhinoconjunctivitis 01/06/2015   GERD (gastroesophageal reflux disease) 01/06/2015   Laryngopharyngeal reflux (LPR) 01/06/2015    PCP: No PCP  REFERRING PROVIDER: Victorino December  REFERRING DIAG: Z47.89   THERAPY DIAG:  Acute pain of right knee  Localized edema  Stiffness of right knee, not elsewhere classified  Difficulty in walking, not elsewhere classified  Rationale for Evaluation and Treatment Rehabilitation  ONSET DATE: 12/26/21  SUBJECTIVE:   SUBJECTIVE STATEMENT: Reports less hip pain today, jsut feels very stiff PERTINENT HISTORY: R meniscus repait post op 7 weeks  PAIN:  Are you having pain? Yes: NPRS scale: 5/10 Pain location: R knee  Reports that he started having some right hip soreness 7/10 Pain description: achy, throbbing  Aggravating factors: walking Relieving factors: ice, tylenol   PRECAUTIONS: None  WEIGHT BEARING RESTRICTIONS No  FALLS:  Has patient fallen in last 6 months? No  LIVING ENVIRONMENT: Lives with: lives with their spouse Lives in: House/apartment Stairs: Yes: Internal: 12 steps; on right going up Has following equipment at home: Single point cane and Walker - 2 wheeled  OCCUPATION: Retired  PLOF: [place]  go out and play pickleball    OBJECTIVE:   PATIENT SURVEYS:  FOTO 35/100  COGNITION:  Overall cognitive status: Within functional limits for tasks assessed     SENSATION: WFL   MUSCLE LENGTH: Hamstrings: Right tightness limiting ROM   POSTURE: No Significant postural limitations and rounded shoulders  PALPATION: No TTP, some swelling in R knee   LOWER EXTREMITY ROM:  Active ROM Right eval 02/07/22 03/03/22 03/10/22  Hip flexion      Hip extension      Hip abduction      Hip adduction      Hip internal rotation       Hip external rotation      Knee flexion 88 105 120 120  Knee extension -15 -_0 Ankle dorsiflexion      Ankle plantarflexion      Ankle inversion      Ankle eversion       (Blank rows = not tested)  LOWER EXTREMITY MMT:  MMT Right eval Left eval  Hip flexion 4   Hip extension    Hip abduction    Hip adduction    Hip internal rotation    Hip external rotation    Knee flexion 4   Knee extension 3+ w/pain   Ankle dorsiflexion    Ankle plantarflexion    Ankle inversion    Ankle eversion     (Blank rows = not tested)  FUNCTIONAL TESTS:  5 times sit to stand: 18.89s Timed up and go (TUG): 17.53s  02/24/22 12 seconds  GAIT: Distance walked: 33f Assistive device utilized: WEnvironmental consultant- 2 wheeled and able to do TUG without AD  Level of assistance: Modified independence Comments: decreased stance time on RLE, decreased step length, antalgic gait, decreased R knee flexion,     TODAY'S TREATMENT: 03/17/22 Bike  Elliptical TKE with band  S2S   Leg curls 35# both legs 2x10, 20# right only x10 Leg extension 10# 2x10, right only 5# x10 Leg press 40# 2x10 both, 2x10 right only   03/13/22 Bike L4.3 x 6 minutes STM with tennis ball to R piriformis, with stretch, F/B clamshells against G tband resistance Supine hip strengthening-clamshells and bridging with G tband resistance to engage abductors, U bridge x 5 reps each limb Standing TKE with ball x 10 reps Ambualted outdoors on unlevel surfaces with improved stride, normalized pattern on R, including downhill.   03/10/22 Bike level 4 x 6 minutes Gait outside 1 lap had a limp down the slope Leg extension end range focus on TKE/VMO Leg press 40# 2x10.  Right only 20# 2x10 50# resisted gait all directions PROM to the right knee and hip Vaso right knee in elevation medium pressure  03/05/22 Bike level 4 x 6 minutes Gait one lap outside had more of a limp down hill Leg curls 35# both legs 2x10, 20# right only Leg  extension right only 5#  Leg press 40$ 2x10 both, 2x10 right only  Passive HS, piriformis stretches MHP/IFC to the right hip area   03/03/22 Bike level 4 x 6 minutes Calf stretches Leg press 50# both legs 2x10 Leg press 30# right only 2x10 Leg extension right eccentrics 2x10 5# Leg curls 15# 2x10 right only Resisted gait all directions Passive ROM into extension, calf and hip stretch Vaso right knee medium pressure   02/26/22 Bike level 4 x 6 minutes Calf stretches HS curls 25# 2x10 both legs then right only 15# 2x10 LEg extension 5#  3x10 with eccentric on the right Resisted gait all directions Feet on ball K2C, trunk rotation, small bridges, isometric abs Passive Knee extension stretch, HS and hip stretches Vaso right knee medium pressure STM tot he right hip    PATIENT EDUCATION:  Education details: POC Person educated: Patient Education method: Explanation Education comprehension: verbalized understanding   HOME EXERCISE PROGRAM: LAQ, SAQ, knee flexion seated- given from [place]:  CLINICAL IMPRESSION: Patient reports that his Dr instructed him in TO during gait and HS to normalize his RLE in gait. Continued emphasis on TKE strength and ROM, also performed STM and stretch/strengthen to R piriformis. Updated HEP to address strength needs. Need to still add additional strengthening for calves, stretching for hips and heel cords. REHAB POTENTIAL: Good  CLINICAL DECISION MAKING: Stable/uncomplicated  EVALUATION COMPLEXITY: Low   GOALS: Goals reviewed with patient? No  SHORT TERM GOALS: Target date: 03/12/22  Patient will be independent with initial HEP. Goal status:met   LONG TERM GOALS: Target date: 04/23/22  Patient will be independent with advanced/ongoing HEP to improve outcomes and carryover.  Goal status: ongoing  2.  Patient will report at least 75% improvement in R knee pain to improve QOL. Baseline: 3/10 pain Goal status:  partially met  3.  Patient will demonstrate improved R knee AROM to >/= 5-120 deg to allow for normal gait and stair mechanics. Baseline: 15-0-88 Goal status: met at 5-120 degrees flexion  4.  Patient will demonstrate improved functional RLE strength as demonstrated by 5/5. Goal status: progressing  5.  Patient will be able to ambulate 600' with normal gait pattern without increased pain to access community.  Baseline: walking with RW Goal status: partially met  6. Patient will be able to ascend/descend stairs with 1 HR and reciprocal step pattern safely to access home and community.  Baseline: using cane to navigate stairs Goal status: progressing  7.  Patient will report 83 on FOTO to demonstrate improved functional ability. Baseline: 35 at eval, 54 03/05/22 Goal status: partially met  PLAN: PT FREQUENCY: 2x/week  PT DURATION: 12 weeks  PLANNED INTERVENTIONS: Therapeutic exercises, Therapeutic activity, Neuromuscular re-education, Balance training, Gait training, Patient/Family education, Self Care, Joint mobilization, Stair training, Electrical stimulation, Cryotherapy, Taping, Vasopneumatic device, Ionotophoresis 28m/ml Dexamethasone, and Manual therapy  PLAN FOR NEXT SESSION: Add heel cord stretch and heel raises to HEP. Add hip stretches-piriformis, gluts to [facility] PT 03/13/2022, 10:40 AM

## 2022-03-17 NOTE — Telephone Encounter (Addendum)
I spoke with him and his bp 128/82 and 148/85. Had sudden onset of low back pain w/o doing anything to it. He also has been having headaches and dizziness for several weeks. I had suggested f/u with PCP but he is very anxious and knows he is. He just wanted to make sure Dr Oval Linsey didn't think anything cardiac going on or concern for aneurysm. Discussed with Dr Oval Linsey via secure chat asking her to review message, response "He should see his PCP. His imaging is all very reassuring"  Mychart message to patient

## 2022-03-18 DIAGNOSIS — M545 Low back pain, unspecified: Secondary | ICD-10-CM | POA: Diagnosis not present

## 2022-03-18 DIAGNOSIS — S39012A Strain of muscle, fascia and tendon of lower back, initial encounter: Secondary | ICD-10-CM | POA: Diagnosis not present

## 2022-03-19 ENCOUNTER — Ambulatory Visit: Payer: Medicare Other

## 2022-03-21 ENCOUNTER — Emergency Department (HOSPITAL_BASED_OUTPATIENT_CLINIC_OR_DEPARTMENT_OTHER)
Admission: EM | Admit: 2022-03-21 | Discharge: 2022-03-21 | Disposition: A | Discharge status: Home / Self Care | Payer: Medicare Other | Attending: Emergency Medicine | Admitting: Emergency Medicine

## 2022-03-21 ENCOUNTER — Other Ambulatory Visit: Payer: Self-pay

## 2022-03-21 ENCOUNTER — Encounter (HOSPITAL_BASED_OUTPATIENT_CLINIC_OR_DEPARTMENT_OTHER): Payer: Self-pay | Admitting: Emergency Medicine

## 2022-03-21 ENCOUNTER — Emergency Department (HOSPITAL_BASED_OUTPATIENT_CLINIC_OR_DEPARTMENT_OTHER): Payer: Medicare Other

## 2022-03-21 DIAGNOSIS — I2510 Atherosclerotic heart disease of native coronary artery without angina pectoris: Secondary | ICD-10-CM | POA: Insufficient documentation

## 2022-03-21 DIAGNOSIS — R0602 Shortness of breath: Secondary | ICD-10-CM | POA: Diagnosis not present

## 2022-03-21 DIAGNOSIS — R002 Palpitations: Secondary | ICD-10-CM | POA: Diagnosis not present

## 2022-03-21 DIAGNOSIS — Z7951 Long term (current) use of inhaled steroids: Secondary | ICD-10-CM | POA: Diagnosis not present

## 2022-03-21 DIAGNOSIS — R079 Chest pain, unspecified: Secondary | ICD-10-CM | POA: Diagnosis not present

## 2022-03-21 DIAGNOSIS — R42 Dizziness and giddiness: Secondary | ICD-10-CM | POA: Diagnosis not present

## 2022-03-21 DIAGNOSIS — J45909 Unspecified asthma, uncomplicated: Secondary | ICD-10-CM | POA: Diagnosis not present

## 2022-03-21 DIAGNOSIS — Z7982 Long term (current) use of aspirin: Secondary | ICD-10-CM | POA: Diagnosis not present

## 2022-03-21 DIAGNOSIS — R0789 Other chest pain: Secondary | ICD-10-CM | POA: Diagnosis not present

## 2022-03-21 LAB — BASIC METABOLIC PANEL
Anion gap: 8 (ref 5–15)
BUN: 20 mg/dL (ref 8–23)
CO2: 23 mmol/L (ref 22–32)
Calcium: 9.2 mg/dL (ref 8.9–10.3)
Chloride: 110 mmol/L (ref 98–111)
Creatinine, Ser: 1.33 mg/dL — ABNORMAL HIGH (ref 0.61–1.24)
GFR, Estimated: 58 mL/min — ABNORMAL LOW (ref >60)
Glucose, Bld: 113 mg/dL — ABNORMAL HIGH (ref 70–99)
Potassium: 3.9 mmol/L (ref 3.5–5.1)
Sodium: 141 mmol/L (ref 135–145)

## 2022-03-21 LAB — CBC
HCT: 45.1 % (ref 39.0–52.0)
Hemoglobin: 15.7 g/dL (ref 13.0–17.0)
MCH: 30.3 pg (ref 26.0–34.0)
MCHC: 34.8 g/dL (ref 30.0–36.0)
MCV: 87.1 fL (ref 80.0–100.0)
Platelets: 139 10*3/uL — ABNORMAL LOW (ref 150–400)
RBC: 5.18 MIL/uL (ref 4.22–5.81)
RDW: 12.6 % (ref 11.5–15.5)
WBC: 8.1 10*3/uL (ref 4.0–10.5)
nRBC: 0 % (ref 0.0–0.2)

## 2022-03-21 LAB — TROPONIN I (HIGH SENSITIVITY)
Troponin I (High Sensitivity): 5 ng/L (ref <18)
Troponin I (High Sensitivity): 5 ng/L (ref <18)

## 2022-03-21 LAB — MAGNESIUM: Magnesium: 2.1 mg/dL (ref 1.7–2.4)

## 2022-03-21 LAB — TSH: TSH: 1.661 u[IU]/mL (ref 0.350–4.500)

## 2022-03-21 LAB — PHOSPHORUS: Phosphorus: 3.8 mg/dL (ref 2.5–4.6)

## 2022-03-21 NOTE — ED Provider Notes (Signed)
[facility] Provider Note   CSN: [id number] Arrival date & time: 03/21/22  1501     History  Chief Complaint  Patient presents with   Palpitations   Chest Pain   Dizziness    Arthur Moore is a 71 y.o. male.  Patient is a 71 year old male with a past medical history of asthma, CAD, PTSD/depression presenting to the emergency department with chest pain and palpitations.  The patient states that he has been having palpitations for the past week and a half.  He states that they have been constant throughout the day and feels like his heart is flip-flopping throughout his chest.  He states that he is prescribed Ativan and has been taking that as needed but has had no improvement the last 3 days.  He states that today he started to develop a sharp midsternal chest pain.  He states that last for seconds at a time and has been coming and going.  He states he has some mild shortness of breath.  He states that he feels mildly lightheaded and dizzy.  He denies any nausea or vomiting, lower extremity swelling.  He denies any recent fevers.  He states he had a Holter monitor by cardiology several years ago that was normal.   Palpitations Associated symptoms: chest pain and dizziness   Chest Pain Associated symptoms: dizziness and palpitations   Dizziness Associated symptoms: chest pain and palpitations        Home Medications Prior to Admission medications   Medication Sig Start Date End Date Taking? Authorizing Provider  albuterol (VENTOLIN HFA) 108 (90 Base) MCG/ACT inhaler Inhale 2 puffs into the lungs every 4 (four) hours as needed for wheezing or shortness of breath. 12/20/21   Kozlow, Donnamarie Poag, MD  aspirin EC 81 MG tablet Take 81 mg by mouth daily.    [provider]  budesonide (RHINOCORT AQUA) 32 MCG/ACT nasal spray Use two sprays in each nostril once daily Patient not taking: Reported on 02/12/2022 11/12/21   Kozlow, Donnamarie Poag, MD   budesonide-formoterol (SYMBICORT) 160-4.5 MCG/ACT inhaler Inhale 2 puffs into the lungs 2 (two) times daily. 12/11/21   Kozlow, Donnamarie Poag, MD  busPIRone (BUSPAR) 15 MG tablet Take 1 tablet (15 mg total) by mouth 3 (three) times daily. Patient taking differently: Take 15 mg by mouth daily. 08/27/21   Mozingo, Berdie Ogren, NP  cetirizine (ZYRTEC ALLERGY) 10 MG tablet Take 1 tablet (10 mg total) by mouth daily as needed (can take an ectra dose during flare ups). 12/20/21   Kozlow, Donnamarie Poag, MD  Cholecalciferol (VITAMIN D3) 5000 units TABS Take 5,000 Units by mouth daily.     [provider]  fluticasone (FLONASE) 50 MCG/ACT nasal spray 1-2 sprays each nostril 1 (ONE) time per day. 12/20/21   Kozlow, Donnamarie Poag, MD  lamoTRIgine (LAMICTAL) 200 MG tablet Take 1 tablet (200 mg total) by mouth 2 (two) times daily. 02/05/22   Mozingo, Berdie Ogren, NP  LORazepam (ATIVAN) 0.5 MG tablet Take 1 tablet (0.5 mg total) by mouth 3 (three) times daily as needed for anxiety. 02/07/22   Mozingo, Berdie Ogren, NP  meclizine (ANTIVERT) 25 MG tablet Take 1 tablet (25 mg total) by mouth 3 (three) times daily as needed for dizziness. 03/10/22   Veryl Speak, MD  metoprolol tartrate (LOPRESSOR) 25 MG tablet TAKE 1/2 TABLET BY MOUTH TWICE A DAY 01/31/21   Skeet Latch, MD  Multiple Vitamin (MULTIVITAMIN) capsule Take 1 capsule by mouth daily. Patient  not taking: Reported on 02/12/2022    [provider]  nitroGLYCERIN (NITROSTAT) 0.4 MG SL tablet Place 1 tablet (0.4 mg total) under the tongue every 5 (five) minutes as needed for chest pain. 05/13/19 11/08/21  Deberah Pelton, NP  pantoprazole (PROTONIX) 40 MG tablet Take 1 tablet by mouth 1-2 times per day. 12/20/21   Kozlow, Donnamarie Poag, MD  Probiotic Product (PROBIOTIC DAILY PO) Take 1 tablet by mouth daily. Patient not taking: Reported on 02/12/2022    [provider]  rosuvastatin (CRESTOR) 10 MG tablet Take 1 tablet by mouth daily.    [provider]  zinc gluconate 50 MG tablet Take 1 tablet (50 mg total) by mouth daily. 07/26/18   Lendon Colonel, NP      Allergies    Atorvastatin and Zolpidem    Review of Systems   Review of Systems  Cardiovascular:  Positive for chest pain and palpitations.  Neurological:  Positive for dizziness.    Physical Exam Updated Vital Signs BP 126/75   Pulse 63   Temp 97.7 F (36.5 C)   Resp 17   Ht 5' 11.5" (1.816 m)   Wt 83.9 kg   SpO2 94%   BMI 25.44 kg/m  Physical Exam Vitals and nursing note reviewed.  Constitutional:      General: He is not in acute distress.    Appearance: He is well-developed.  HENT:     Head: Normocephalic and atraumatic.  Eyes:     Extraocular Movements: Extraocular movements intact.  Cardiovascular:     Rate and Rhythm: Normal rate and regular rhythm.     Pulses:          Radial pulses are 2+ on the right side and 2+ on the left side.     Heart sounds: Normal heart sounds.  Pulmonary:     Effort: Pulmonary effort is normal.     Breath sounds: Normal breath sounds.  Chest:     Chest wall: No tenderness.  Abdominal:     Palpations: Abdomen is soft.     Tenderness: There is no abdominal tenderness.  Musculoskeletal:        General: Normal range of motion.     Cervical back: Normal range of motion and neck supple.     Right lower leg: No edema.     Left lower leg: No edema.  Skin:    General: Skin is warm and dry.  Neurological:     General: No focal deficit present.     Mental Status: He is alert and oriented to person, place, and time.     ED Results / Procedures / Treatments   Labs (all labs ordered are listed, but only abnormal results are displayed) Labs Reviewed  BASIC METABOLIC PANEL - Abnormal; Notable for the following components:      Result Value   Glucose, Bld 113 (*)    Creatinine, Ser 1.33 (*)    GFR, Estimated 58 (*)    All other components within normal limits  CBC - Abnormal; Notable for the following  components:   Platelets 139 (*)    All other components within normal limits  MAGNESIUM  PHOSPHORUS  TSH  TROPONIN I (HIGH SENSITIVITY)  TROPONIN I (HIGH SENSITIVITY)    EKG EKG Interpretation  Date/Time:  Friday March 21 2022 15:20:30 EST Ventricular Rate:  67 PR Interval:  181 QRS Duration: 107 QT Interval:  400 QTC Calculation: 423 R Axis:   -10 Text  Interpretation: Sinus rhythm No significant change since last tracing Confirmed by Leanord Asal ([id number]) on 03/21/2022 3:40:11 PM  Radiology DG Chest 2 View  Result Date: 03/21/2022 CLINICAL DATA:  Chest pain EXAM: CHEST - 2 VIEW COMPARISON:  11/12/2021 FINDINGS: The heart size and mediastinal contours are within normal limits. Both lungs are clear. The visualized skeletal structures are unremarkable. IMPRESSION: No acute abnormality of the lungs. Electronically Signed   By: Delanna Ahmadi M.D.   On: 03/21/2022 15:38    Procedures Procedures    Medications Ordered in ED Medications - No data to display  ED Course/ Medical Decision Making/ A&P Clinical Course as of 03/21/22 2309  Fri Mar 21, 2022  1818 On reassessment, the patient continues to feel the palpitations but his heart rate is controlled in the 60s and he is currently on a beta-blocker so no medication changes will be made at this time.  He has remained in normal sinus rhythm on the cardiac monitor here.  Labs are within normal range.  TSH will not result while he is in the ED today and he was recommended to follow this up on his patient portal.  He was recommended to follow-up with his cardiologist and primary doctor for further work-up and evaluation of his palpitations. [VK]    Clinical Course User Index [VK] Kemper Durie, DO                           Medical Decision Making This patient presents to the ED with chief complaint(s) of chest pain and palpitations with pertinent past medical history of asthma, CAD, PTSD which further complicates the  presenting complaint. The complaint involves an extensive differential diagnosis and also carries with it a high risk of complications and morbidity.    The differential diagnosis includes ACS, arrhythmia, electrolyte abnormality, anemia, PE unlikely as patient is low risk by Wells criteria   Additional history obtained: Additional history obtained from spouse Records reviewed previous ED records  ED Course and Reassessment: On patient's arrival to the emergency department he is awake alert and well-appearing in no acute distress.  Initial EKG on arrival showed normal sinus rhythm without any PVCs or PACs.  Independent labs interpretation:  The following labs were independently interpreted: within normal range  Independent visualization of imaging: - I independently visualized the following imaging with scope of interpretation limited to determining acute life threatening conditions related to emergency care: CXR, which revealed no acute disease  Consultation: - Consulted or discussed management/test interpretation w/ external professional: N/A  Consideration for admission or further workup: Patient has no emergent conditions requiring admission or further work-up at this time and is stable for discharge home with primary care follow-up Social Determinants of health: N/A    Amount and/or Complexity of Data Reviewed Labs: ordered. Radiology: ordered.          Final Clinical Impression(s) / ED Diagnoses Final diagnoses:  Palpitations  Nonspecific chest pain    Rx / DC Orders ED Discharge Orders     None         Kemper Durie, DO 03/21/22 2309

## 2022-03-21 NOTE — Discharge Instructions (Addendum)
You were seen in the emergency department for your palpitations.  Your work-up here was normal with no signs of abnormal heart rhythms.  We did send a thyroid function tests that takes several hours to come back and you should follow this up online on your patient portal.  You should follow-up with your primary doctor and your cardiologist to have your symptoms rechecked.  You should return to the emergency department for worsening chest pain, if you pass out, severe shortness of breath or if you have any other new or concerning symptoms.

## 2022-03-21 NOTE — ED Triage Notes (Addendum)
Heart palpitations x 1.5 weeks. Left sided chest pain started today. Denies radiation. Also endorses dizziness. Describes pain as intermittent and aching.

## 2022-03-21 NOTE — ED Notes (Signed)
Pt up to Br ambulatory

## 2022-03-21 NOTE — ED Notes (Signed)
Patient transported to X-ray 

## 2022-03-24 ENCOUNTER — Ambulatory Visit (INDEPENDENT_AMBULATORY_CARE_PROVIDER_SITE_OTHER): Payer: Medicare Other

## 2022-03-24 ENCOUNTER — Ambulatory Visit: Payer: Medicare Other | Admitting: Physical Therapy

## 2022-03-24 DIAGNOSIS — K219 Gastro-esophageal reflux disease without esophagitis: Secondary | ICD-10-CM | POA: Diagnosis not present

## 2022-03-24 DIAGNOSIS — K31A0 Gastric intestinal metaplasia, unspecified: Secondary | ICD-10-CM | POA: Diagnosis not present

## 2022-03-24 DIAGNOSIS — K7460 Unspecified cirrhosis of liver: Secondary | ICD-10-CM | POA: Diagnosis not present

## 2022-03-24 DIAGNOSIS — K862 Cyst of pancreas: Secondary | ICD-10-CM | POA: Diagnosis not present

## 2022-03-24 DIAGNOSIS — J309 Allergic rhinitis, unspecified: Secondary | ICD-10-CM | POA: Diagnosis not present

## 2022-03-24 DIAGNOSIS — Z86010 Personal history of colonic polyps: Secondary | ICD-10-CM | POA: Diagnosis not present

## 2022-03-26 ENCOUNTER — Ambulatory Visit: Payer: Medicare Other | Admitting: Physical Therapy

## 2022-03-27 ENCOUNTER — Ambulatory Visit: Payer: Medicare Other | Admitting: Physical Therapy

## 2022-03-31 ENCOUNTER — Ambulatory Visit: Payer: Medicare Other | Attending: Orthopedic Surgery | Admitting: Physical Therapy

## 2022-03-31 ENCOUNTER — Encounter (HOSPITAL_BASED_OUTPATIENT_CLINIC_OR_DEPARTMENT_OTHER): Payer: Self-pay | Admitting: Cardiovascular Disease

## 2022-03-31 ENCOUNTER — Encounter: Payer: Self-pay | Admitting: Physical Therapy

## 2022-03-31 DIAGNOSIS — R600 Localized edema: Secondary | ICD-10-CM | POA: Diagnosis not present

## 2022-03-31 DIAGNOSIS — M25561 Pain in right knee: Secondary | ICD-10-CM | POA: Diagnosis not present

## 2022-03-31 DIAGNOSIS — R262 Difficulty in walking, not elsewhere classified: Secondary | ICD-10-CM | POA: Diagnosis not present

## 2022-03-31 DIAGNOSIS — M25661 Stiffness of right knee, not elsewhere classified: Secondary | ICD-10-CM | POA: Diagnosis not present

## 2022-03-31 NOTE — Therapy (Signed)
OUTPATIENT PHYSICAL THERAPY LOWER EXTREMITY TREATMENT   Patient Name: Arthur Moore MRN: [id number] DOB:[birth year], 71 y.o., male Today's Date: 03/31/2022   PT End of Session - 03/31/22 1357     Visit Number 14    Date for PT Re-Evaluation 04/23/22    Authorization Type Medicare    PT Start Time 1354    PT Stop Time 1450    PT Time Calculation (min) 56 min    Activity Tolerance Patient tolerated treatment well    Behavior During Therapy [facility] for tasks assessed/performed                Past Medical History:  Diagnosis Date   Aortic insufficiency    Arthritis    Asthma    Carotid bruit 07/09/2021   Cervical disc disease    Depression    Diastolic dysfunction 0/53/9767   Hyperlipidemia 08/24/2015   Hypertension    Lumbar disc disease    Psoriasis    Past Surgical History:  Procedure Laterality Date   CHOLECYSTECTOMY  2012   HERNIA REPAIR     LEFT HEART CATH AND CORONARY ANGIOGRAPHY N/A 07/08/2018   Procedure: LEFT HEART CATH AND CORONARY ANGIOGRAPHY;  Surgeon: Burnell Blanks, MD;  Location: [facility];  Service: Cardiovascular;  Laterality: N/A;   PAROTIDECTOMY Right 1992   PROSTATE SURGERY  2002, 2004   [facility]     SINUS EXPLORATION  2013   SPINE SURGERY  2013   L C7-T1 laminectomy and discectomy   TONSILLECTOMY     Patient Active Problem List   Diagnosis Date Noted   Snoring 02/14/2022   Mild sleep apnea 02/12/2022   Carotid bruit 07/09/2021   Aneurysm of ascending aorta ([place]) 06/28/2020   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   CAD (coronary artery disease) 07/09/2018   CKD (chronic kidney disease), 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   Chest pain of uncertain etiology 34/19/3790   Chronic cough 06/21/2018   Thrombocytopenia ([place]) 12/01/2016   Transaminitis 24/12/7351   Diastolic dysfunction 29/92/4268   MDD (major depressive disorder), recurrent severe, 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   Hyperlipidemia 08/24/2015    Arthritis    Depression    Hypertension    Aortic insufficiency    Psoriasis    Cervical disc disease    Lumbar disc disease    Asthma 01/06/2015   Psoriatic arthritis ([place]) 01/06/2015   Allergic rhinoconjunctivitis 01/06/2015   GERD (gastroesophageal reflux disease) 01/06/2015   Laryngopharyngeal reflux (LPR) 01/06/2015    PCP: No PCP  REFERRING PROVIDER: Victorino December  REFERRING DIAG: Z47.89   THERAPY DIAG:  Acute pain of right knee  Localized edema  Stiffness of right knee, not elsewhere classified  Difficulty in walking, not elsewhere classified  Rationale for Evaluation and Treatment Rehabilitation  ONSET DATE: 12/26/21  SUBJECTIVE:   SUBJECTIVE STATEMENT: PAtient had an issue with LBP and hear, he went to the ED, back and heart both checked out okay, reports that he is frustrated with the back and the knee, went for a short walk today and reports pain in the knee and the back, PERTINENT HISTORY: R meniscus repait post op 7 weeks  PAIN:  Are you having pain? Yes right knee 4/10, dificuclty walking  PRECAUTIONS: None  WEIGHT BEARING RESTRICTIONS No  FALLS:  Has patient fallen in last 6 months? No  LIVING ENVIRONMENT: Lives with: lives with their spouse Lives in: House/apartment Stairs: Yes: Internal: 12 steps; on right going up Has following equipment at home:  Single point cane and Walker - 2 wheeled  OCCUPATION: Retired  PLOF: Independent  PATIENT GOALS go out and play pickleball    OBJECTIVE:   PATIENT SURVEYS:  FOTO 35/100  COGNITION:  Overall cognitive status: Within functional limits for tasks assessed     SENSATION: WFL   MUSCLE LENGTH: Hamstrings: Right tightness limiting ROM   POSTURE: No Significant postural limitations and rounded shoulders  PALPATION: No TTP, some swelling in R knee   LOWER EXTREMITY ROM:  Active ROM Right eval 02/07/22 03/03/22 03/10/22 03/31/22  Hip flexion       Hip extension       Hip abduction        Hip adduction       Hip internal rotation       Hip external rotation       Knee flexion 88 105 120 120 120  Knee extension -15 -_0 Ankle dorsiflexion       Ankle plantarflexion       Ankle inversion       Ankle eversion        (Blank rows = not tested)  LOWER EXTREMITY MMT:  MMT Right eval Left eval  Hip flexion 4   Hip extension    Hip abduction    Hip adduction    Hip internal rotation    Hip external rotation    Knee flexion 4   Knee extension 3+ w/pain   Ankle dorsiflexion    Ankle plantarflexion    Ankle inversion    Ankle eversion     (Blank rows = not tested)  FUNCTIONAL TESTS:  5 times sit to stand: 18.89s Timed up and go (TUG): 17.53s  02/24/22 12 seconds  GAIT: Distance walked: 68f Assistive device utilized: WEnvironmental consultant- 2 wheeled and able to do TUG without AD  Level of assistance: Modified independence Comments: decreased stance time on RLE, decreased step length, antalgic gait, decreased R knee flexion,     TODAY'S TREATMENT: 03/31/22 Bike level 4 x 6 minutes Leg curls 25# 3x10 Leg extension 10# 3x10 Leg press 40# 3x10 Resisted gait all directions 40#  4x each Calf stretch Tmill off fwd and backward 3x 20s each Passive stretch of the knee into extension Vaso right knee medium pressure in elevation  03/13/22 Bike L4.3 x 6 minutes STM with tennis ball to R piriformis, with stretch, F/B clamshells against G tband resistance Supine hip strengthening-clamshells and bridging with G tband resistance to engage abductors, U bridge x 5 reps each limb Standing TKE with ball x 10 reps Ambualted outdoors on unlevel surfaces with improved stride, normalized pattern on R, including downhill.   03/10/22 Bike level 4 x 6 minutes Gait outside 1 lap had a limp down the slope Leg extension end range focus on TKE/VMO Leg press 40# 2x10.  Right only 20# 2x10 50# resisted gait all directions PROM to the right knee and hip Vaso right knee in elevation  medium pressure  03/05/22 Bike level 4 x 6 minutes Gait one lap outside had more of a limp down hill Leg curls 35# both legs 2x10, 20# right only Leg extension right only 5#  Leg press 40$ 2x10 both, 2x10 right only  Passive HS, piriformis stretches MHP/IFC to the right hip area   03/03/22 Bike level 4 x 6 minutes Calf stretches Leg press 50# both legs 2x10 Leg press 30# right only 2x10 Leg extension right eccentrics 2x10 5# Leg curls 15# 2x10 right  only Resisted gait all directions Passive ROM into extension, calf and hip stretch Vaso right knee medium pressure   02/26/22 Bike level 4 x 6 minutes Calf stretches HS curls 25# 2x10 both legs then right only 15# 2x10 LEg extension 5# 3x10 with eccentric on the right Resisted gait all directions Feet on ball K2C, trunk rotation, small bridges, isometric abs Passive Knee extension stretch, HS and hip stretches Vaso right knee medium pressure STM tot he right hip    PATIENT EDUCATION:  Education details: POC Person educated: Patient Education method: Explanation Education comprehension: verbalized understanding   HOME EXERCISE PROGRAM: LAQ, SAQ, knee flexion seated- given from [place]:  CLINICAL IMPRESSION: Patient reports that he had to go to the ED with worries about his heart and his back.  Heart and back both checked out okay, he has not been to PT in a little over 3 weeks, slight decrease in the extension of the knee, I did a little less weight and good reps and sets to ease back in and finished with vaso  REHAB POTENTIAL: Good  CLINICAL DECISION MAKING: Stable/uncomplicated  EVALUATION COMPLEXITY: Low   GOALS: Goals reviewed with patient? No  SHORT TERM GOALS: Target date: 03/12/22  Patient will be independent with initial HEP. Goal status:met   LONG TERM GOALS: Target date: 04/23/22  Patient will be independent with advanced/ongoing HEP to improve outcomes and carryover.  Goal  status: ongoing  2.  Patient will report at least 75% improvement in R knee pain to improve QOL. Baseline: 3/10 pain Goal status: partially met  3.  Patient will demonstrate improved R knee AROM to >/= 5-120 deg to allow for normal gait and stair mechanics. Baseline: 15-0-88 Goal status: met at 5-120 degrees flexion  4.  Patient will demonstrate improved functional RLE strength as demonstrated by 5/5. Goal status: progressing  5.  Patient will be able to ambulate 600' with normal gait pattern without increased pain to access community.  Baseline: walking with RW Goal status: partially met  6. Patient will be able to ascend/descend stairs with 1 HR and reciprocal step pattern safely to access home and community.  Baseline: using cane to navigate stairs Goal status: progressing  7.  Patient will report 4 on FOTO to demonstrate improved functional ability. Baseline: 35 at eval, 54 03/05/22 Goal status: partially met  PLAN: PT FREQUENCY: 2x/week  PT DURATION: 12 weeks  PLANNED INTERVENTIONS: Therapeutic exercises, Therapeutic activity, Neuromuscular re-education, Balance training, Gait training, Patient/Family education, Self Care, Joint mobilization, Stair training, Electrical stimulation, Cryotherapy, Taping, Vasopneumatic device, Ionotophoresis 33m/ml Dexamethasone, and Manual therapy  PLAN FOR NEXT SESSION: try to get him moving again and watch for issues   ASumner Boast PT 03/31/2022, 1:58 PM

## 2022-04-01 ENCOUNTER — Ambulatory Visit (INDEPENDENT_AMBULATORY_CARE_PROVIDER_SITE_OTHER): Payer: Medicare Other

## 2022-04-01 DIAGNOSIS — J309 Allergic rhinitis, unspecified: Secondary | ICD-10-CM

## 2022-04-02 ENCOUNTER — Encounter: Payer: Self-pay | Admitting: Physical Therapy

## 2022-04-02 ENCOUNTER — Ambulatory Visit: Payer: Medicare Other | Admitting: Physical Therapy

## 2022-04-02 ENCOUNTER — Telehealth: Payer: Self-pay | Admitting: Adult Health

## 2022-04-02 ENCOUNTER — Other Ambulatory Visit: Payer: Self-pay

## 2022-04-02 DIAGNOSIS — M25561 Pain in right knee: Secondary | ICD-10-CM | POA: Diagnosis not present

## 2022-04-02 DIAGNOSIS — R262 Difficulty in walking, not elsewhere classified: Secondary | ICD-10-CM

## 2022-04-02 DIAGNOSIS — M25661 Stiffness of right knee, not elsewhere classified: Secondary | ICD-10-CM | POA: Diagnosis not present

## 2022-04-02 DIAGNOSIS — R600 Localized edema: Secondary | ICD-10-CM

## 2022-04-02 MED ORDER — CLONAZEPAM 0.5 MG PO TABS: 0.5000 mg | ORAL_TABLET | Freq: Three times a day (TID) | ORAL | 0 refills | Status: DC | PRN

## 2022-04-02 NOTE — Telephone Encounter (Signed)
Pt lvm that his anxiety is off the charts. The lorazapam is not touching it. The anxiety is making him stressed. He would like to have something else for his anxiety and a phone call from the nurse. He will not be available until after 3 pm. Please call him at [phone]

## 2022-04-02 NOTE — Therapy (Signed)
OUTPATIENT PHYSICAL THERAPY LOWER EXTREMITY TREATMENT   Patient Name: Arthur Moore MRN: [id number] DOB:05/03/1950, 71 y.o., male Today's Date: 04/02/2022   PT End of Session - 04/02/22 1358     Visit Number 15    Date for PT Re-Evaluation 04/23/22    Authorization Type Medicare    PT Start Time 1355    PT Stop Time 1445    PT Time Calculation (min) 50 min    Activity Tolerance Patient tolerated treatment well    Behavior During Therapy [facility] for tasks assessed/performed                Past Medical History:  Diagnosis Date   Aortic insufficiency    Arthritis    Asthma    Carotid bruit 07/09/2021   Cervical disc disease    Depression    Diastolic dysfunction 08/11/6061   Hyperlipidemia 08/24/2015   Hypertension    Lumbar disc disease    Psoriasis    Past Surgical History:  Procedure Laterality Date   CHOLECYSTECTOMY  2012   HERNIA REPAIR     LEFT HEART CATH AND CORONARY ANGIOGRAPHY N/A 07/08/2018   Procedure: LEFT HEART CATH AND CORONARY ANGIOGRAPHY;  Surgeon: Burnell Blanks, MD;  Location: [place] [facility];  Service: Cardiovascular;  Laterality: N/A;   PAROTIDECTOMY Right 1992   PROSTATE SURGERY  2002, 2004   [place]     SINUS EXPLORATION  2013   SPINE SURGERY  2013   L C7-T1 laminectomy and discectomy   TONSILLECTOMY     Patient Active Problem List   Diagnosis Date Noted   Snoring 02/14/2022   Mild sleep apnea 02/12/2022   Carotid bruit 07/09/2021   Aneurysm of ascending aorta ([place]) 06/28/2020   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   CAD (coronary artery disease) 07/09/2018   CKD (chronic kidney disease), 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   Chest pain of uncertain etiology 01/60/1093   Chronic cough 06/21/2018   Thrombocytopenia ([place]) 12/01/2016   Transaminitis 23/55/7322   Diastolic dysfunction 02/54/2706   MDD (major depressive disorder), recurrent severe, without psychosis ([place]) 10/13/2015   Hyperlipidemia 08/24/2015    Arthritis    Depression    Hypertension    Aortic insufficiency    Psoriasis    Cervical disc disease    Lumbar disc disease    Asthma 01/06/2015   Psoriatic arthritis ([place]) 01/06/2015   Allergic rhinoconjunctivitis 01/06/2015   GERD (gastroesophageal reflux disease) 01/06/2015   Laryngopharyngeal reflux (LPR) 01/06/2015    PCP: No PCP  REFERRING PROVIDER: Victorino December  REFERRING DIAG: Z47.89   THERAPY DIAG:  Acute pain of right knee  Localized edema  Stiffness of right knee, not elsewhere classified  Difficulty in walking, not elsewhere classified  Rationale for Evaluation and Treatment Rehabilitation  ONSET DATE: 12/26/21  SUBJECTIVE:   SUBJECTIVE STATEMENT: PAtient doing well still with some pain and decreased ROM and some difficulty with dynamic surfaces PERTINENT HISTORY: R meniscus repait post op 7 weeks  PAIN:  Are you having pain? Yes right knee 4/10, dificuclty walking  PRECAUTIONS: None  WEIGHT BEARING RESTRICTIONS No  FALLS:  Has patient fallen in last 6 months? No  LIVING ENVIRONMENT: Lives with: lives with their spouse Lives in: House/apartment Stairs: Yes: Internal: 12 steps; on right going up Has following equipment at home: Single point cane and Walker - 2 wheeled  OCCUPATION: Retired  PLOF: Independent  PATIENT GOALS go out and play pickleball    OBJECTIVE:   PATIENT SURVEYS:  FOTO 35/100  COGNITION:  Overall cognitive status: Within functional limits for tasks assessed     SENSATION: WFL   MUSCLE LENGTH: Hamstrings: Right tightness limiting ROM   POSTURE: No Significant postural limitations and rounded shoulders  PALPATION: No TTP, some swelling in R knee   LOWER EXTREMITY ROM:  Active ROM Right eval 02/07/22 03/03/22 03/10/22 03/31/22  Hip flexion       Hip extension       Hip abduction       Hip adduction       Hip internal rotation       Hip external rotation       Knee flexion 88 105 120 120 120  Knee  extension -15 -_0 Ankle dorsiflexion       Ankle plantarflexion       Ankle inversion       Ankle eversion        (Blank rows = not tested)  LOWER EXTREMITY MMT:  MMT Right eval Left eval  Hip flexion 4   Hip extension    Hip abduction    Hip adduction    Hip internal rotation    Hip external rotation    Knee flexion 4   Knee extension 3+ w/pain   Ankle dorsiflexion    Ankle plantarflexion    Ankle inversion    Ankle eversion     (Blank rows = not tested)  FUNCTIONAL TESTS:  5 times sit to stand: 18.89s Timed up and go (TUG): 17.53s  02/24/22 12 seconds  GAIT: Distance walked: 23f Assistive device utilized: WEnvironmental consultant- 2 wheeled and able to do TUG without AD  Level of assistance: Modified independence Comments: decreased stance time on RLE, decreased step length, antalgic gait, decreased R knee flexion,     TODAY'S TREATMENT: 04/02/22 Nustep level 5 x 5 minutes Bike Level 4 x 6 minutes LEg extension 10# 3x10, some eccentrics right leg Leg curls 25# x10, then right only 20# 2x10 Leg press 40# 2x10 and then right only 2x8 Resisted gait 50# all motions Volley ball and having him do some lateral motions On Bosu balance PROM right knee extension  03/31/22 Bike level 4 x 6 minutes Leg curls 25# 3x10 Leg extension 10# 3x10 Leg press 40# 3x10 Resisted gait all directions 40#  4x each Calf stretch Tmill off fwd and backward 3x 20s each Passive stretch of the knee into extension Vaso right knee medium pressure in elevation  03/13/22 Bike L4.3 x 6 minutes STM with tennis ball to R piriformis, with stretch, F/B clamshells against G tband resistance Supine hip strengthening-clamshells and bridging with G tband resistance to engage abductors, U bridge x 5 reps each limb Standing TKE with ball x 10 reps Ambualted outdoors on unlevel surfaces with improved stride, normalized pattern on R, including downhill.   03/10/22 Bike level 4 x 6 minutes Gait outside 1  lap had a limp down the slope Leg extension end range focus on TKE/VMO Leg press 40# 2x10.  Right only 20# 2x10 50# resisted gait all directions PROM to the right knee and hip Vaso right knee in elevation medium pressure  03/05/22 Bike level 4 x 6 minutes Gait one lap outside had more of a limp down hill Leg curls 35# both legs 2x10, 20# right only Leg extension right only 5#  Leg press 40$ 2x10 both, 2x10 right only  Passive HS, piriformis stretches MHP/IFC to the right hip area   03/03/22 Bike  level 4 x 6 minutes Calf stretches Leg press 50# both legs 2x10 Leg press 30# right only 2x10 Leg extension right eccentrics 2x10 5# Leg curls 15# 2x10 right only Resisted gait all directions Passive ROM into extension, calf and hip stretch Vaso right knee medium pressure   PATIENT EDUCATION:  Education details: POC Person educated: Patient Education method: Explanation Education comprehension: verbalized understanding   HOME EXERCISE PROGRAM: LAQ, SAQ, knee flexion seated- given from [place]:  CLINICAL IMPRESSION: Patient reports that he is doing well, we added some balance and he was very fatigued with this and he did struggle with the bosu. REHAB POTENTIAL: Good  CLINICAL DECISION MAKING: Stable/uncomplicated  EVALUATION COMPLEXITY: Low   GOALS: Goals reviewed with patient? No  SHORT TERM GOALS: Target date: 03/12/22  Patient will be independent with initial HEP. Goal status:met   LONG TERM GOALS: Target date: 04/23/22  Patient will be independent with advanced/ongoing HEP to improve outcomes and carryover.  Goal status: ongoing  2.  Patient will report at least 75% improvement in R knee pain to improve QOL. Baseline: 3/10 pain Goal status: partially met  3.  Patient will demonstrate improved R knee AROM to >/= 5-120 deg to allow for normal gait and stair mechanics. Baseline: 15-0-88 Goal status: met at 5-120 degrees flexion  4.  Patient  will demonstrate improved functional RLE strength as demonstrated by 5/5. Goal status: progressing  5.  Patient will be able to ambulate 600' with normal gait pattern without increased pain to access community.  Baseline: walking with RW Goal status: partially met  6. Patient will be able to ascend/descend stairs with 1 HR and reciprocal step pattern safely to access home and community.  Baseline: using cane to navigate stairs Goal status: progressing  7.  Patient will report 16 on FOTO to demonstrate improved functional ability. Baseline: 35 at eval, 54 03/05/22 Goal status: partially met  PLAN: PT FREQUENCY: 2x/week  PT DURATION: 12 weeks  PLANNED INTERVENTIONS: Therapeutic exercises, Therapeutic activity, Neuromuscular re-education, Balance training, Gait training, Patient/Family education, Self Care, Joint mobilization, Stair training, Electrical stimulation, Cryotherapy, Taping, Vasopneumatic device, Ionotophoresis 56m/ml Dexamethasone, and Manual therapy  PLAN FOR NEXT SESSION: try to get him moving again and watch for issues   ASumner Boast PT 04/02/2022, 1:59 PM

## 2022-04-02 NOTE — Telephone Encounter (Signed)
Per conversation with [name] pended script for clonazepam 0.5 mg TID prn.

## 2022-04-02 NOTE — Telephone Encounter (Signed)
Patient is agreeable to increasing the dose if that is his only option. He would like something to give him instant relief.

## 2022-04-02 NOTE — Telephone Encounter (Signed)
Pt stated he can't explain the feeling he is having other then his body feels stressed.He is also having some headaches he is not sure if they are being caused by the ativan or anxiety itself.He wants to know what can help with anxiety

## 2022-04-02 NOTE — Telephone Encounter (Signed)
The Ativan should not be causing headaches - he has been on this medication for some time. We could try increasing the dose.

## 2022-04-03 NOTE — Telephone Encounter (Signed)
Pt called asking does he need to taper off Lorazepam to start Clonazepam? Contact @ [phone]

## 2022-04-03 NOTE — Telephone Encounter (Signed)
Patient notified

## 2022-04-03 NOTE — Telephone Encounter (Signed)
No taper needed - transitioning to a different benzodiazepine.

## 2022-04-08 ENCOUNTER — Ambulatory Visit: Payer: Medicare Other | Admitting: Physical Therapy

## 2022-04-08 ENCOUNTER — Encounter: Payer: Self-pay | Admitting: Physical Therapy

## 2022-04-08 DIAGNOSIS — M25561 Pain in right knee: Secondary | ICD-10-CM

## 2022-04-08 DIAGNOSIS — R600 Localized edema: Secondary | ICD-10-CM | POA: Diagnosis not present

## 2022-04-08 DIAGNOSIS — M25661 Stiffness of right knee, not elsewhere classified: Secondary | ICD-10-CM | POA: Diagnosis not present

## 2022-04-08 DIAGNOSIS — R262 Difficulty in walking, not elsewhere classified: Secondary | ICD-10-CM

## 2022-04-08 NOTE — Therapy (Signed)
OUTPATIENT PHYSICAL THERAPY LOWER EXTREMITY TREATMENT   Patient Name: Arthur Moore MRN: [id number] DOB:Mar 17, 1951, 71 y.o., male Today's Date: 04/08/2022   PT End of Session - 04/08/22 1515     Visit Number 16    Date for PT Re-Evaluation 04/23/22    PT Start Time 1515    PT Stop Time 1600    PT Time Calculation (min) 45 min    Activity Tolerance Patient tolerated treatment well    Behavior During Therapy [facility] for tasks assessed/performed                Past Medical History:  Diagnosis Date   Aortic insufficiency    Arthritis    Asthma    Carotid bruit 07/09/2021   Cervical disc disease    Depression    Diastolic dysfunction 2/77/4128   Hyperlipidemia 08/24/2015   Hypertension    Lumbar disc disease    Psoriasis    Past Surgical History:  Procedure Laterality Date   CHOLECYSTECTOMY  2012   HERNIA REPAIR     LEFT HEART CATH AND CORONARY ANGIOGRAPHY N/A 07/08/2018   Procedure: LEFT HEART CATH AND CORONARY ANGIOGRAPHY;  Surgeon: Burnell Blanks, MD;  Location: [place] [facility];  Service: Cardiovascular;  Laterality: N/A;   PAROTIDECTOMY Right 1992   PROSTATE SURGERY  2002, 2004   [place]     SINUS EXPLORATION  2013   SPINE SURGERY  2013   L C7-T1 laminectomy and discectomy   TONSILLECTOMY     Patient Active Problem List   Diagnosis Date Noted   Snoring 02/14/2022   Mild sleep apnea 02/12/2022   Carotid bruit 07/09/2021   Aneurysm of ascending aorta ([place]) 06/28/2020   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   CAD (coronary artery disease) 07/09/2018   CKD (chronic kidney disease), 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   Chest pain of uncertain etiology 78/67/6720   Chronic cough 06/21/2018   Thrombocytopenia ([place]) 12/01/2016   Transaminitis 94/70/9628   Diastolic dysfunction 36/62/9476   MDD (major depressive disorder), recurrent severe, without psychosis ([place]) 10/13/2015   Hyperlipidemia 08/24/2015   Arthritis    Depression     Hypertension    Aortic insufficiency    Psoriasis    Cervical disc disease    Lumbar disc disease    Asthma 01/06/2015   Psoriatic arthritis ([place]) 01/06/2015   Allergic rhinoconjunctivitis 01/06/2015   GERD (gastroesophageal reflux disease) 01/06/2015   Laryngopharyngeal reflux (LPR) 01/06/2015    PCP: No PCP  REFERRING PROVIDER: Victorino December  REFERRING DIAG: Z47.89   THERAPY DIAG:  Acute pain of right knee  Localized edema  Difficulty in walking, not elsewhere classified  Stiffness of right knee, not elsewhere classified  Rationale for Evaluation and Treatment Rehabilitation  ONSET DATE: 12/26/21  SUBJECTIVE:   SUBJECTIVE STATEMENT: "Still have a little limp and some pain"  PERTINENT HISTORY: R meniscus repait post op 7 weeks  PAIN:  Are you having pain? Yes right knee 3/10, "Its just their"  PRECAUTIONS: None  WEIGHT BEARING RESTRICTIONS No  FALLS:  Has patient fallen in last 6 months? No  LIVING ENVIRONMENT: Lives with: lives with their spouse Lives in: House/apartment Stairs: Yes: Internal: 12 steps; on right going up Has following equipment at home: Single point cane and Walker - 2 wheeled  OCCUPATION: Retired  PLOF: [place] go out and play pickleball    OBJECTIVE:   PATIENT SURVEYS:  FOTO 35/100  COGNITION:  Overall cognitive status: Within functional limits  for tasks assessed     SENSATION: WFL   MUSCLE LENGTH: Hamstrings: Right tightness limiting ROM   POSTURE: No Significant postural limitations and rounded shoulders  PALPATION: No TTP, some swelling in R knee   LOWER EXTREMITY ROM:  Active ROM Right eval 02/07/22 03/03/22 03/10/22 03/31/22  Hip flexion       Hip extension       Hip abduction       Hip adduction       Hip internal rotation       Hip external rotation       Knee flexion 88 105 120 120 120  Knee extension -15 -_0 Ankle dorsiflexion       Ankle plantarflexion       Ankle  inversion       Ankle eversion        (Blank rows = not tested)  LOWER EXTREMITY MMT:  MMT Right eval Left eval  Hip flexion 4   Hip extension    Hip abduction    Hip adduction    Hip internal rotation    Hip external rotation    Knee flexion 4   Knee extension 3+ w/pain   Ankle dorsiflexion    Ankle plantarflexion    Ankle inversion    Ankle eversion     (Blank rows = not tested)  FUNCTIONAL TESTS:  5 times sit to stand: 18.89s Timed up and go (TUG): 17.53s  02/24/22 12 seconds  GAIT: Distance walked: 68f Assistive device utilized: WEnvironmental consultant- 2 wheeled and able to do TUG without AD  Level of assistance: Modified independence Comments: decreased stance time on RLE, decreased step length, antalgic gait, decreased R knee flexion,     TODAY'S TREATMENT: 04/08/22 Nustep level 5 x 5 minutes Bike Level 4 x 6 minutes Resisted gait 50# all motions 4in RLE eccentrics step downs 2x10 Leg extension 10# 3x10, 5lb RLE 2x10 Leg curls 25# 2x10, then right only 20# 2x10 RLE SLS 15'' x4 Heel raises   04/02/22 Nustep level 5 x 5 minutes Bike Level 4 x 6 minutes LEg extension 10# 3x10, some eccentrics right leg Leg curls 25# x10, then right only 20# 2x10 Leg press 40# 2x10 and then right only 2x8 Resisted gait 50# all motions Volley ball and having him do some lateral motions On Bosu balance PROM right knee extension  03/31/22 Bike level 4 x 6 minutes Leg curls 25# 3x10 Leg extension 10# 3x10 Leg press 40# 3x10 Resisted gait all directions 40#  4x each Calf stretch Tmill off fwd and backward 3x 20s each Passive stretch of the knee into extension Vaso right knee medium pressure in elevation  03/13/22 Bike L4.3 x 6 minutes STM with tennis ball to R piriformis, with stretch, F/B clamshells against G tband resistance Supine hip strengthening-clamshells and bridging with G tband resistance to engage abductors, U bridge x 5 reps each limb Standing TKE with ball x 10  reps Ambualted outdoors on unlevel surfaces with improved stride, normalized pattern on R, including downhill.   03/10/22 Bike level 4 x 6 minutes Gait outside 1 lap had a limp down the slope Leg extension end range focus on TKE/VMO Leg press 40# 2x10.  Right only 20# 2x10 50# resisted gait all directions PROM to the right knee and hip Vaso right knee in elevation medium pressure  03/05/22 Bike level 4 x 6 minutes Gait one lap outside had more of a limp down hill Leg  curls 35# both legs 2x10, 20# right only Leg extension right only 5#  Leg press 40$ 2x10 both, 2x10 right only  Passive HS, piriformis stretches MHP/IFC to the right hip area   03/03/22 Bike level 4 x 6 minutes Calf stretches Leg press 50# both legs 2x10 Leg press 30# right only 2x10 Leg extension right eccentrics 2x10 5# Leg curls 15# 2x10 right only Resisted gait all directions Passive ROM into extension, calf and hip stretch Vaso right knee medium pressure   PATIENT EDUCATION:  Education details: POC Person educated: Patient Education method: Explanation Education comprehension: verbalized understanding   HOME EXERCISE PROGRAM: LAQ, SAQ, knee flexion seated- given from [place]:  CLINICAL IMPRESSION: Pt enters doing well overall. Decrease activity tolerance with activity requiring frequent rest. All interventons completed well. Pt had the most difficulty with SLS and eccentric step downs.  REHAB POTENTIAL: Good  CLINICAL DECISION MAKING: Stable/uncomplicated  EVALUATION COMPLEXITY: Low   GOALS: Goals reviewed with patient? No  SHORT TERM GOALS: Target date: 03/12/22  Patient will be independent with initial HEP. Goal status:met   LONG TERM GOALS: Target date: 04/23/22  Patient will be independent with advanced/ongoing HEP to improve outcomes and carryover.  Goal status: ongoing  2.  Patient will report at least 75% improvement in R knee pain to improve QOL. Baseline:  3/10 pain Goal status: partially met  3.  Patient will demonstrate improved R knee AROM to >/= 5-120 deg to allow for normal gait and stair mechanics. Baseline: 15-0-88 Goal status: met at 5-120 degrees flexion  4.  Patient will demonstrate improved functional RLE strength as demonstrated by 5/5. Goal status: progressing  5.  Patient will be able to ambulate 600' with normal gait pattern without increased pain to access community.  Baseline: walking with RW Goal status: partially met  6. Patient will be able to ascend/descend stairs with 1 HR and reciprocal step pattern safely to access home and community.  Baseline: using cane to navigate stairs Goal status: progressing  7.  Patient will report 79 on FOTO to demonstrate improved functional ability. Baseline: 35 at eval, 54 03/05/22 Goal status: partially met  PLAN: PT FREQUENCY: 2x/week  PT DURATION: 12 weeks  PLANNED INTERVENTIONS: Therapeutic exercises, Therapeutic activity, Neuromuscular re-education, Balance training, Gait training, Patient/Family education, Self Care, Joint mobilization, Stair training, Electrical stimulation, Cryotherapy, Taping, Vasopneumatic device, Ionotophoresis 2m/ml Dexamethasone, and Manual therapy  PLAN FOR NEXT SESSION: try to get him moving again and watch for issues   RScot Jun PTA 04/08/2022, 3:16 PM

## 2022-04-09 DIAGNOSIS — H8113 Benign paroxysmal vertigo, bilateral: Secondary | ICD-10-CM | POA: Diagnosis not present

## 2022-04-09 DIAGNOSIS — R109 Unspecified abdominal pain: Secondary | ICD-10-CM | POA: Diagnosis not present

## 2022-04-09 DIAGNOSIS — R202 Paresthesia of skin: Secondary | ICD-10-CM | POA: Diagnosis not present

## 2022-04-09 DIAGNOSIS — H6992 Unspecified Eustachian tube disorder, left ear: Secondary | ICD-10-CM | POA: Diagnosis not present

## 2022-04-09 DIAGNOSIS — R030 Elevated blood-pressure reading, without diagnosis of hypertension: Secondary | ICD-10-CM | POA: Diagnosis not present

## 2022-04-10 ENCOUNTER — Ambulatory Visit: Payer: Medicare Other | Admitting: Physical Therapy

## 2022-04-15 ENCOUNTER — Ambulatory Visit: Payer: Medicare Other | Admitting: Physical Therapy

## 2022-04-15 ENCOUNTER — Encounter: Payer: Self-pay | Admitting: Physical Therapy

## 2022-04-15 ENCOUNTER — Telehealth: Payer: Self-pay | Admitting: Adult Health

## 2022-04-15 DIAGNOSIS — M25561 Pain in right knee: Secondary | ICD-10-CM

## 2022-04-15 DIAGNOSIS — R600 Localized edema: Secondary | ICD-10-CM | POA: Diagnosis not present

## 2022-04-15 DIAGNOSIS — R262 Difficulty in walking, not elsewhere classified: Secondary | ICD-10-CM | POA: Diagnosis not present

## 2022-04-15 DIAGNOSIS — M25661 Stiffness of right knee, not elsewhere classified: Secondary | ICD-10-CM | POA: Diagnosis not present

## 2022-04-15 NOTE — Therapy (Signed)
OUTPATIENT PHYSICAL THERAPY LOWER EXTREMITY TREATMENT   Patient Name: Arthur Moore MRN: [id number] DOB:1951-01-28, 71 y.o., male Today's Date: 04/15/2022   PT End of Session - 04/15/22 1710     Visit Number 17    Date for PT Re-Evaluation 04/23/22    Authorization Type Medicare    PT Start Time 1657    PT Stop Time 1740    PT Time Calculation (min) 43 min    Activity Tolerance Patient tolerated treatment well    Behavior During Therapy [facility] for tasks assessed/performed                Past Medical History:  Diagnosis Date   Aortic insufficiency    Arthritis    Asthma    Carotid bruit 07/09/2021   Cervical disc disease    Depression    Diastolic dysfunction 6/94/5038   Hyperlipidemia 08/24/2015   Hypertension    Lumbar disc disease    Psoriasis    Past Surgical History:  Procedure Laterality Date   CHOLECYSTECTOMY  2012   HERNIA REPAIR     LEFT HEART CATH AND CORONARY ANGIOGRAPHY N/A 07/08/2018   Procedure: LEFT HEART CATH AND CORONARY ANGIOGRAPHY;  Surgeon: Burnell Blanks, MD;  Location: [place] [facility];  Service: Cardiovascular;  Laterality: N/A;   PAROTIDECTOMY Right 1992   PROSTATE SURGERY  2002, 2004   [place]     SINUS EXPLORATION  2013   SPINE SURGERY  2013   L C7-T1 laminectomy and discectomy   TONSILLECTOMY     Patient Active Problem List   Diagnosis Date Noted   Snoring 02/14/2022   Mild sleep apnea 02/12/2022   Carotid bruit 07/09/2021   Aneurysm of ascending aorta ([place]) 06/28/2020   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   CAD (coronary artery disease) 07/09/2018   CKD (chronic kidney disease), 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   Chest pain of uncertain etiology 88/28/0034   Chronic cough 06/21/2018   Thrombocytopenia ([place]) 12/01/2016   Transaminitis 91/79/1505   Diastolic dysfunction 69/79/4801   MDD (major depressive disorder), recurrent severe, without psychosis ([place]) 10/13/2015   Hyperlipidemia 08/24/2015    Arthritis    Depression    Hypertension    Aortic insufficiency    Psoriasis    Cervical disc disease    Lumbar disc disease    Asthma 01/06/2015   Psoriatic arthritis ([place]) 01/06/2015   Allergic rhinoconjunctivitis 01/06/2015   GERD (gastroesophageal reflux disease) 01/06/2015   Laryngopharyngeal reflux (LPR) 01/06/2015    PCP: No PCP  REFERRING PROVIDER: Victorino December  REFERRING DIAG: Z47.89   THERAPY DIAG:  Acute pain of right knee  Localized edema  Difficulty in walking, not elsewhere classified  Stiffness of right knee, not elsewhere classified  Rationale for Evaluation and Treatment Rehabilitation  ONSET DATE: 12/26/21  SUBJECTIVE:   SUBJECTIVE STATEMENT: I had a cortisone injection this afternoon in the knee, he thinks it could be OITB related. PERTINENT HISTORY: R meniscus repait post op 7 weeks  PAIN:  Are you having pain? Yes right knee 3/10, "Its just their"  PRECAUTIONS: None  WEIGHT BEARING RESTRICTIONS No  FALLS:  Has patient fallen in last 6 months? No  LIVING ENVIRONMENT: Lives with: lives with their spouse Lives in: House/apartment Stairs: Yes: Internal: 12 steps; on right going up Has following equipment at home: Single point cane and Walker - 2 wheeled  OCCUPATION: Retired  PLOF: Independent  PATIENT GOALS go out and play pickleball    OBJECTIVE:  PATIENT SURVEYS:  FOTO 35/100  COGNITION:  Overall cognitive status: Within functional limits for tasks assessed     SENSATION: WFL   MUSCLE LENGTH: Hamstrings: Right tightness limiting ROM   POSTURE: No Significant postural limitations and rounded shoulders  PALPATION: No TTP, some swelling in R knee   LOWER EXTREMITY ROM:  Active ROM Right eval 02/07/22 03/03/22 03/10/22 03/31/22  Hip flexion       Hip extension       Hip abduction       Hip adduction       Hip internal rotation       Hip external rotation       Knee flexion 88 105 120 120 120  Knee extension  -15 -_0 Ankle dorsiflexion       Ankle plantarflexion       Ankle inversion       Ankle eversion        (Blank rows = not tested)  LOWER EXTREMITY MMT:  MMT Right eval Left eval  Hip flexion 4   Hip extension    Hip abduction    Hip adduction    Hip internal rotation    Hip external rotation    Knee flexion 4   Knee extension 3+ w/pain   Ankle dorsiflexion    Ankle plantarflexion    Ankle inversion    Ankle eversion     (Blank rows = not tested)  FUNCTIONAL TESTS:  5 times sit to stand: 18.89s Timed up and go (TUG): 17.53s  02/24/22 12 seconds  GAIT: Distance walked: 107f Assistive device utilized: WEnvironmental consultant- 2 wheeled and able to do TUG without AD  Level of assistance: Modified independence Comments: decreased stance time on RLE, decreased step length, antalgic gait, decreased R knee flexion,     TODAY'S TREATMENT: 04/15/22 Passive ROM of the right LE gentle stretches STM with the theragun to the quad, ITB and HS Vaso high pressure 34 degrees right knee  04/08/22 Nustep level 5 x 5 minutes Bike Level 4 x 6 minutes Resisted gait 50# all motions 4in RLE eccentrics step downs 2x10 Leg extension 10# 3x10, 5lb RLE 2x10 Leg curls 25# 2x10, then right only 20# 2x10 RLE SLS 15'' x4 Heel raises   04/02/22 Nustep level 5 x 5 minutes Bike Level 4 x 6 minutes LEg extension 10# 3x10, some eccentrics right leg Leg curls 25# x10, then right only 20# 2x10 Leg press 40# 2x10 and then right only 2x8 Resisted gait 50# all motions Volley ball and having him do some lateral motions On Bosu balance PROM right knee extension  03/31/22 Bike level 4 x 6 minutes Leg curls 25# 3x10 Leg extension 10# 3x10 Leg press 40# 3x10 Resisted gait all directions 40#  4x each Calf stretch Tmill off fwd and backward 3x 20s each Passive stretch of the knee into extension Vaso right knee medium pressure in elevation   PATIENT EDUCATION:  Education details: POC Person  educated: Patient Education method: Explanation Education comprehension: verbalized understanding   HOME EXERCISE PROGRAM: LAQ, SAQ, knee flexion seated- [place]  CLINICAL IMPRESSION: Due to the cortisone injection today I elected per most protocols to not exercise and did PROM and some STM.  He reports that the MD felt it could be ITB issue  REHAB POTENTIAL: Good  CLINICAL DECISION MAKING: Stable/uncomplicated  EVALUATION COMPLEXITY: Low   GOALS: Goals reviewed with patient? No  SHORT TERM GOALS: Target  date: 03/12/22  Patient will be independent with initial HEP. Goal status:met   LONG TERM GOALS: Target date: 04/23/22  Patient will be independent with advanced/ongoing HEP to improve outcomes and carryover.  Goal status: ongoing  2.  Patient will report at least 75% improvement in R knee pain to improve QOL. Baseline: 3/10 pain Goal status: partially met  3.  Patient will demonstrate improved R knee AROM to >/= 5-120 deg to allow for normal gait and stair mechanics. Baseline: 15-0-88 Goal status: met at 5-120 degrees flexion  4.  Patient will demonstrate improved functional RLE strength as demonstrated by 5/5. Goal status: progressing  5.  Patient will be able to ambulate 600' with normal gait pattern without increased pain to access community.  Baseline: walking with RW Goal status: partially met  6. Patient will be able to ascend/descend stairs with 1 HR and reciprocal step pattern safely to access home and community.  Baseline: using cane to navigate stairs Goal status: progressing  7.  Patient will report 61 on FOTO to demonstrate improved functional ability. Baseline: 35 at eval, 54 03/05/22 Goal status: partially met  PLAN: PT FREQUENCY: 2x/week  PT DURATION: 12 weeks  PLANNED INTERVENTIONS: Therapeutic exercises, Therapeutic activity, Neuromuscular re-education, Balance training, Gait training, Patient/Family education,  Self Care, Joint mobilization, Stair training, Electrical stimulation, Cryotherapy, Taping, Vasopneumatic device, Ionotophoresis 39m/ml Dexamethasone, and Manual therapy  PLAN FOR NEXT SESSION: see how he does after the injection  Satcha Storlie W, PT 04/15/2022, 5:32 PM

## 2022-04-15 NOTE — Telephone Encounter (Signed)
Patient had a question again about going from lorazepam to clonazepam and if he needed to taper. This was addressed on 12/7 and I again reviewed with him the fact that [name] said there was no need to taper. I told him not to take both.

## 2022-04-15 NOTE — Telephone Encounter (Signed)
Pt LVM requesting a call back. Has question about his medication. Contact # [phone]

## 2022-04-15 NOTE — Telephone Encounter (Signed)
LVM to [facility] 

## 2022-04-17 ENCOUNTER — Ambulatory Visit (INDEPENDENT_AMBULATORY_CARE_PROVIDER_SITE_OTHER): Payer: Medicare Other

## 2022-04-17 DIAGNOSIS — J309 Allergic rhinitis, unspecified: Secondary | ICD-10-CM | POA: Diagnosis not present

## 2022-04-18 ENCOUNTER — Ambulatory Visit: Payer: Medicare Other | Admitting: Physical Therapy

## 2022-04-18 ENCOUNTER — Encounter: Payer: Self-pay | Admitting: Physical Therapy

## 2022-04-18 DIAGNOSIS — M25561 Pain in right knee: Secondary | ICD-10-CM | POA: Diagnosis not present

## 2022-04-18 DIAGNOSIS — R600 Localized edema: Secondary | ICD-10-CM | POA: Diagnosis not present

## 2022-04-18 DIAGNOSIS — M25661 Stiffness of right knee, not elsewhere classified: Secondary | ICD-10-CM | POA: Diagnosis not present

## 2022-04-18 DIAGNOSIS — R262 Difficulty in walking, not elsewhere classified: Secondary | ICD-10-CM

## 2022-04-18 NOTE — Therapy (Signed)
OUTPATIENT PHYSICAL THERAPY LOWER EXTREMITY TREATMENT   Patient Name: Arthur Moore MRN: [id number] DOB:1950/08/10, 71 y.o., male Today's Date: 04/18/2022   PT End of Session - 04/18/22 1139     Visit Number 18    Date for PT Re-Evaluation 04/23/22    Authorization Type Medicare    PT Start Time 1100    PT Stop Time 1150    PT Time Calculation (min) 50 min    Activity Tolerance Patient tolerated treatment well    Behavior During Therapy [facility] for tasks assessed/performed                Past Medical History:  Diagnosis Date   Aortic insufficiency    Arthritis    Asthma    Carotid bruit 07/09/2021   Cervical disc disease    Depression    Diastolic dysfunction 12/08/7515   Hyperlipidemia 08/24/2015   Hypertension    Lumbar disc disease    Psoriasis    Past Surgical History:  Procedure Laterality Date   CHOLECYSTECTOMY  2012   HERNIA REPAIR     LEFT HEART CATH AND CORONARY ANGIOGRAPHY N/A 07/08/2018   Procedure: LEFT HEART CATH AND CORONARY ANGIOGRAPHY;  Surgeon: Burnell Blanks, MD;  Location: [place] [facility];  Service: Cardiovascular;  Laterality: N/A;   PAROTIDECTOMY Right 1992   PROSTATE SURGERY  2002, 2004   [place]     SINUS EXPLORATION  2013   SPINE SURGERY  2013   L C7-T1 laminectomy and discectomy   TONSILLECTOMY     Patient Active Problem List   Diagnosis Date Noted   Snoring 02/14/2022   Mild sleep apnea 02/12/2022   Carotid bruit 07/09/2021   Aneurysm of ascending aorta ([place]) 06/28/2020   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   CAD (coronary artery disease) 07/09/2018   CKD (chronic kidney disease), 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   Chest pain of uncertain etiology 00/17/4944   Chronic cough 06/21/2018   Thrombocytopenia ([place]) 12/01/2016   Transaminitis 96/75/9163   Diastolic dysfunction 84/66/5993   MDD (major depressive disorder), recurrent severe, without psychosis ([place]) 10/13/2015   Hyperlipidemia 08/24/2015    Arthritis    Depression    Hypertension    Aortic insufficiency    Psoriasis    Cervical disc disease    Lumbar disc disease    Asthma 01/06/2015   Psoriatic arthritis ([place]) 01/06/2015   Allergic rhinoconjunctivitis 01/06/2015   GERD (gastroesophageal reflux disease) 01/06/2015   Laryngopharyngeal reflux (LPR) 01/06/2015    PCP: No PCP  REFERRING PROVIDER: Victorino December  REFERRING DIAG: Z47.89   THERAPY DIAG:  Acute pain of right knee  Localized edema  Difficulty in walking, not elsewhere classified  Stiffness of right knee, not elsewhere classified  Rationale for Evaluation and Treatment Rehabilitation  ONSET DATE: 12/26/21  SUBJECTIVE:   SUBJECTIVE STATEMENT: Patient reports that he is a little sore int he right posterior lateral knee, reports that after the injection he did go and do some gym activities, he did say that he got the okay from the MD. PERTINENT HISTORY: R meniscus repait post op 7 weeks  PAIN:  Are you having pain? Yes right knee 3/10, "Its just their"  PRECAUTIONS: None  WEIGHT BEARING RESTRICTIONS No  FALLS:  Has patient fallen in last 6 months? No  LIVING ENVIRONMENT: Lives with: lives with their spouse Lives in: House/apartment Stairs: Yes: Internal: 12 steps; on right going up Has following equipment at home: Single point cane and Walker - 2  wheeled  OCCUPATION: Retired  PLOF: Independent  PATIENT GOALS go out and play pickleball    OBJECTIVE:   PATIENT SURVEYS:  FOTO 35/100  COGNITION:  Overall cognitive status: Within functional limits for tasks assessed     SENSATION: WFL   MUSCLE LENGTH: Hamstrings: Right tightness limiting ROM   POSTURE: No Significant postural limitations and rounded shoulders  PALPATION: No TTP, some swelling in R knee   LOWER EXTREMITY ROM:  Active ROM Right eval 02/07/22 03/03/22 03/10/22 03/31/22  Hip flexion       Hip extension       Hip abduction       Hip adduction       Hip  internal rotation       Hip external rotation       Knee flexion 88 105 120 120 120  Knee extension -15 -_0 Ankle dorsiflexion       Ankle plantarflexion       Ankle inversion       Ankle eversion        (Blank rows = not tested)  LOWER EXTREMITY MMT:  MMT Right eval Left eval  Hip flexion 4   Hip extension    Hip abduction    Hip adduction    Hip internal rotation    Hip external rotation    Knee flexion 4   Knee extension 3+ w/pain   Ankle dorsiflexion    Ankle plantarflexion    Ankle inversion    Ankle eversion     (Blank rows = not tested)  FUNCTIONAL TESTS:  5 times sit to stand: 18.89s Timed up and go (TUG): 17.53s  02/24/22 12 seconds  GAIT: Distance walked: 77f Assistive device utilized: WEnvironmental consultant- 2 wheeled and able to do TUG without AD  Level of assistance: Modified independence Comments: decreased stance time on RLE, decreased step length, antalgic gait, decreased R knee flexion,     TODAY'S TREATMENT: 04/18/22 Nustep level 5 x 6 minutes 4" and 6" step ups Resisted gait STM to the right ITB and HS area PROM tot he leg  04/15/22 Passive ROM of the right LE gentle stretches STM with the theragun to the quad, ITB and HS Vaso high pressure 34 degrees right knee  04/08/22 Nustep level 5 x 5 minutes Bike Level 4 x 6 minutes Resisted gait 50# all motions 4in RLE eccentrics step downs 2x10 Leg extension 10# 3x10, 5lb RLE 2x10 Leg curls 25# 2x10, then right only 20# 2x10 RLE SLS 15'' x4 Heel raises   04/02/22 Nustep level 5 x 5 minutes Bike Level 4 x 6 minutes LEg extension 10# 3x10, some eccentrics right leg Leg curls 25# x10, then right only 20# 2x10 Leg press 40# 2x10 and then right only 2x8 Resisted gait 50# all motions Volley ball and having him do some lateral motions On Bosu balance PROM right knee extension  03/31/22 Bike level 4 x 6 minutes Leg curls 25# 3x10 Leg extension 10# 3x10 Leg press 40# 3x10 Resisted gait all  directions 40#  4x each Calf stretch Tmill off fwd and backward 3x 20s each Passive stretch of the knee into extension Vaso right knee medium pressure in elevation   PATIENT EDUCATION:  Education details: POC Person educated: Patient Education method: Explanation Education comprehension: verbalized understanding   HOME EXERCISE PROGRAM: LAQ, SAQ, knee flexion seated- given from Home Health  ASSESSMENT:  CLINICAL IMPRESSION: Patient very tight and tender in the right HS and  the ITB, we worked on this today as he is recovering from the injection  REHAB POTENTIAL: Good  CLINICAL DECISION MAKING: Stable/uncomplicated  EVALUATION COMPLEXITY: Low   GOALS: Goals reviewed with patient? No  SHORT TERM GOALS: Target date: 03/12/22  Patient will be independent with initial HEP. Goal status:met   LONG TERM GOALS: Target date: 04/23/22  Patient will be independent with advanced/ongoing HEP to improve outcomes and carryover.  Goal status: ongoing  2.  Patient will report at least 75% improvement in R knee pain to improve QOL. Baseline: 3/10 pain Goal status: partially met  3.  Patient will demonstrate improved R knee AROM to >/= 5-120 deg to allow for normal gait and stair mechanics. Baseline: 15-0-88 Goal status: met at 5-120 degrees flexion  4.  Patient will demonstrate improved functional RLE strength as demonstrated by 5/5. Goal status: progressing  5.  Patient will be able to ambulate 600' with normal gait pattern without increased pain to access community.  Baseline: walking with RW Goal status: partially met  6. Patient will be able to ascend/descend stairs with 1 HR and reciprocal step pattern safely to access home and community.  Baseline: using cane to navigate stairs Goal status: progressing  7.  Patient will report 29 on FOTO to demonstrate improved functional ability. Baseline: 35 at eval, 54 03/05/22 Goal status: partially met  PLAN: PT FREQUENCY:  2x/week  PT DURATION: 12 weeks  PLANNED INTERVENTIONS: Therapeutic exercises, Therapeutic activity, Neuromuscular re-education, Balance training, Gait training, Patient/Family education, Self Care, Joint mobilization, Stair training, Electrical stimulation, Cryotherapy, Taping, Vasopneumatic device, Ionotophoresis 35m/ml Dexamethasone, and Manual therapy  PLAN FOR NEXT SESSION: see how he does after the injection and return to strength but will still address the flexibility  ASumner Boast PT 04/18/2022, 11:43 AM

## 2022-04-22 ENCOUNTER — Encounter: Payer: Self-pay | Admitting: Physical Therapy

## 2022-04-22 ENCOUNTER — Ambulatory Visit: Payer: Medicare Other | Admitting: Physical Therapy

## 2022-04-22 DIAGNOSIS — R262 Difficulty in walking, not elsewhere classified: Secondary | ICD-10-CM

## 2022-04-22 DIAGNOSIS — M25661 Stiffness of right knee, not elsewhere classified: Secondary | ICD-10-CM

## 2022-04-22 DIAGNOSIS — M25561 Pain in right knee: Secondary | ICD-10-CM | POA: Diagnosis not present

## 2022-04-22 DIAGNOSIS — R600 Localized edema: Secondary | ICD-10-CM

## 2022-04-22 NOTE — Therapy (Signed)
OUTPATIENT PHYSICAL THERAPY LOWER EXTREMITY TREATMENT   Patient Name: Arthur Moore MRN: [id number] DOB:08-Apr-1951, 71 y.o., male Today's Date: 04/22/2022   PT End of Session - 04/22/22 1107     Visit Number 19    Date for PT Re-Evaluation 04/23/22    Authorization Type Medicare    PT Start Time 1103    PT Stop Time 0150    PT Time Calculation (min) 887 min    Activity Tolerance Patient tolerated treatment well    Behavior During Therapy [facility] for tasks assessed/performed                Past Medical History:  Diagnosis Date   Aortic insufficiency    Arthritis    Asthma    Carotid bruit 07/09/2021   Cervical disc disease    Depression    Diastolic dysfunction 8/46/9629   Hyperlipidemia 08/24/2015   Hypertension    Lumbar disc disease    Psoriasis    Past Surgical History:  Procedure Laterality Date   CHOLECYSTECTOMY  2012   HERNIA REPAIR     LEFT HEART CATH AND CORONARY ANGIOGRAPHY N/A 07/08/2018   Procedure: LEFT HEART CATH AND CORONARY ANGIOGRAPHY;  Surgeon: Burnell Blanks, MD;  Location: [place] [facility];  Service: Cardiovascular;  Laterality: N/A;   PAROTIDECTOMY Right 1992   PROSTATE SURGERY  2002, 2004   [place]     SINUS EXPLORATION  2013   SPINE SURGERY  2013   L C7-T1 laminectomy and discectomy   TONSILLECTOMY     Patient Active Problem List   Diagnosis Date Noted   Snoring 02/14/2022   Mild sleep apnea 02/12/2022   Carotid bruit 07/09/2021   Aneurysm of ascending aorta ([place]) 06/28/2020   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   CAD (coronary artery disease) 07/09/2018   CKD (chronic kidney disease), 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   Chest pain of uncertain etiology 52/84/1324   Chronic cough 06/21/2018   Thrombocytopenia ([place]) 12/01/2016   Transaminitis 40/01/2724   Diastolic dysfunction 36/64/4034   MDD (major depressive disorder), recurrent severe, without psychosis ([place]) 10/13/2015   Hyperlipidemia 08/24/2015    Arthritis    Depression    Hypertension    Aortic insufficiency    Psoriasis    Cervical disc disease    Lumbar disc disease    Asthma 01/06/2015   Psoriatic arthritis ([place]) 01/06/2015   Allergic rhinoconjunctivitis 01/06/2015   GERD (gastroesophageal reflux disease) 01/06/2015   Laryngopharyngeal reflux (LPR) 01/06/2015    PCP: No PCP  REFERRING PROVIDER: Victorino December  REFERRING DIAG: Z47.89   THERAPY DIAG:  Acute pain of right knee  Localized edema  Difficulty in walking, not elsewhere classified  Stiffness of right knee, not elsewhere classified  Rationale for Evaluation and Treatment Rehabilitation  ONSET DATE: 12/26/21  SUBJECTIVE:   SUBJECTIVE STATEMENT: Patient reports that he has pain more in the ITB and feel that this is what is problem is PERTINENT HISTORY: R meniscus repait post op 7 weeks  PAIN:  Are you having pain? Yes right knee 2/10, "Its just their"  PRECAUTIONS: None  WEIGHT BEARING RESTRICTIONS No  FALLS:  Has patient fallen in last 6 months? No  LIVING ENVIRONMENT: Lives with: lives with their spouse Lives in: House/apartment Stairs: Yes: Internal: 12 steps; on right going up Has following equipment at home: Single point cane and Walker - 2 wheeled  OCCUPATION: Retired  PLOF: Independent  PATIENT GOALS go out and play pickleball    OBJECTIVE:  PATIENT SURVEYS:  FOTO 35/100  COGNITION:  Overall cognitive status: Within functional limits for tasks assessed     SENSATION: WFL   MUSCLE LENGTH: Hamstrings: Right tightness limiting ROM   POSTURE: No Significant postural limitations and rounded shoulders  PALPATION: No TTP, some swelling in R knee   LOWER EXTREMITY ROM:  Active ROM Right eval 02/07/22 03/03/22 03/10/22 03/31/22 04/22/22  Hip flexion        Hip extension        Hip abduction        Hip adduction        Hip internal rotation        Hip external rotation        Knee flexion 88 105 120 120 120 122   Knee extension -15 -_0 Ankle dorsiflexion        Ankle plantarflexion        Ankle inversion        Ankle eversion         (Blank rows = not tested)  LOWER EXTREMITY MMT:  MMT Right eval Left eval  Hip flexion 4   Hip extension    Hip abduction    Hip adduction    Hip internal rotation    Hip external rotation    Knee flexion 4   Knee extension 3+ w/pain   Ankle dorsiflexion    Ankle plantarflexion    Ankle inversion    Ankle eversion     (Blank rows = not tested)  FUNCTIONAL TESTS:  5 times sit to stand: 18.89s Timed up and go (TUG): 17.53s  02/24/22 12 seconds   04/22/22 8 seconds  GAIT: Distance walked: 74f Assistive device utilized: WEnvironmental consultant- 2 wheeled and able to do TUG without AD  Level of assistance: Modified independence Comments: decreased stance time on RLE, decreased step length, antalgic gait, decreased R knee flexion,     TODAY'S TREATMENT: 04/22/22 Nustep level 5 x 6 minutes LE's Bike level 5 x 6 minutes Two ways to stretch ITB at home HEP Quad stretch 4" and 6" side step ups and crossover step ups STM to the right ITB and into the HS  04/18/22 Nustep level 5 x 6 minutes 4" and 6" step ups Resisted gait STM to the right ITB and HS area PROM tot he leg  04/15/22 Passive ROM of the right LE gentle stretches STM with the theragun to the quad, ITB and HS Vaso high pressure 34 degrees right knee  04/08/22 Nustep level 5 x 5 minutes Bike Level 4 x 6 minutes Resisted gait 50# all motions 4in RLE eccentrics step downs 2x10 Leg extension 10# 3x10, 5lb RLE 2x10 Leg curls 25# 2x10, then right only 20# 2x10 RLE SLS 15'' x4 Heel raises   04/02/22 Nustep level 5 x 5 minutes Bike Level 4 x 6 minutes LEg extension 10# 3x10, some eccentrics right leg Leg curls 25# x10, then right only 20# 2x10 Leg press 40# 2x10 and then right only 2x8 Resisted gait 50# all motions Volley ball and having him do some lateral motions On Bosu  balance PROM right knee extension  03/31/22 Bike level 4 x 6 minutes Leg curls 25# 3x10 Leg extension 10# 3x10 Leg press 40# 3x10 Resisted gait all directions 40#  4x each Calf stretch Tmill off fwd and backward 3x 20s each Passive stretch of the knee into extension Vaso right knee medium pressure in elevation   PATIENT EDUCATION:  Education  details: POC Person educated: Patient Education method: Explanation Education comprehension: verbalized understanding   HOME EXERCISE PROGRAM: Access Code: [email or web address] URL: [email or web address] Date: 04/22/2022 Prepared by: Lum Babe  Exercises - Supine ITB Stretch with Strap  - 2 x daily - 7 x weekly - 1 sets - 5 reps - 30 hold - Standing ITB Stretch  - 2 x daily - 7 x weekly - 1 sets - 5 reps - 30 holdLAQ, SAQ, knee flexion seated- given from [place]:  CLINICAL IMPRESSION: Continued to work on the ITB as he feels this is his biggest issue, he decreased his TUG time and increased his knee ROM over the past 3-4 weeks, still lacks some extension and reports that he feels stiff into flexion, has pain and tenderness in the ITB  REHAB POTENTIAL: Good  CLINICAL DECISION MAKING: Stable/uncomplicated  EVALUATION COMPLEXITY: Low   GOALS: Goals reviewed with patient? No  SHORT TERM GOALS: Target date: 03/12/22  Patient will be independent with initial HEP. Goal status:met   LONG TERM GOALS: Target date: 04/23/22  Patient will be independent with advanced/ongoing HEP to improve outcomes and carryover.  Goal status: ongoing  2.  Patient will report at least 75% improvement in R knee pain to improve QOL. Baseline: 3/10 pain Goal status: partially met  3.  Patient will demonstrate improved R knee AROM to >/= 5-120 deg to allow for normal gait and stair mechanics. Baseline: 15-0-88 Goal status: met at 5-120 degrees flexion  4.  Patient will demonstrate improved functional RLE strength as  demonstrated by 5/5. Goal status: progressing  5.  Patient will be able to ambulate 600' with normal gait pattern without increased pain to access community.  Baseline: walking with RW Goal status: met  6. Patient will be able to ascend/descend stairs with 1 HR and reciprocal step pattern safely to access home and community.  Baseline: using cane to navigate stairs Goal status: progressing  7.  Patient will report 40 on FOTO to demonstrate improved functional ability. Baseline: 35 at eval, 54 03/05/22 Goal status: partially met  PLAN: PT FREQUENCY: 2x/week  PT DURATION: 12 weeks  PLANNED INTERVENTIONS: Therapeutic exercises, Therapeutic activity, Neuromuscular re-education, Balance training, Gait training, Patient/Family education, Self Care, Joint mobilization, Stair training, Electrical stimulation, Cryotherapy, Taping, Vasopneumatic device, Ionotophoresis 31m/ml Dexamethasone, and Manual therapy  PLAN FOR NEXT SESSION: next visit will need renewal  Demir Titsworth W, PT 04/22/2022, 11:08 AM

## 2022-04-23 ENCOUNTER — Ambulatory Visit: Payer: Medicare Other | Admitting: Physical Therapy

## 2022-04-23 ENCOUNTER — Encounter: Payer: Self-pay | Admitting: Physical Therapy

## 2022-04-23 DIAGNOSIS — M25661 Stiffness of right knee, not elsewhere classified: Secondary | ICD-10-CM

## 2022-04-23 DIAGNOSIS — M25561 Pain in right knee: Secondary | ICD-10-CM | POA: Diagnosis not present

## 2022-04-23 DIAGNOSIS — R262 Difficulty in walking, not elsewhere classified: Secondary | ICD-10-CM

## 2022-04-23 DIAGNOSIS — R600 Localized edema: Secondary | ICD-10-CM | POA: Diagnosis not present

## 2022-04-23 NOTE — Therapy (Signed)
OUTPATIENT PHYSICAL THERAPY LOWER EXTREMITY TREATMENT  Progress Note Reporting Period 03/05/22 to 04/23/22 for visits 11-20  See note below for Objective Data and Assessment of Progress/Goals.     Patient Name: Arthur Moore MRN: [id number] DOB:1950/05/08, 71 y.o., male Today's Date: 04/23/2022   PT End of Session - 04/23/22 1058     Visit Number 20    Date for PT Re-Evaluation 05/31/22    Authorization Type Medicare    PT Start Time 1058    PT Stop Time 1147    PT Time Calculation (min) 49 min    Activity Tolerance Patient tolerated treatment well    Behavior During Therapy [facility] for tasks assessed/performed                Past Medical History:  Diagnosis Date   Aortic insufficiency    Arthritis    Asthma    Carotid bruit 07/09/2021   Cervical disc disease    Depression    Diastolic dysfunction 12/05/1749   Hyperlipidemia 08/24/2015   Hypertension    Lumbar disc disease    Psoriasis    Past Surgical History:  Procedure Laterality Date   CHOLECYSTECTOMY  2012   HERNIA REPAIR     LEFT HEART CATH AND CORONARY ANGIOGRAPHY N/A 07/08/2018   Procedure: LEFT HEART CATH AND CORONARY ANGIOGRAPHY;  Surgeon: Burnell Blanks, MD;  Location: [place] [facility];  Service: Cardiovascular;  Laterality: N/A;   PAROTIDECTOMY Right 1992   PROSTATE SURGERY  2002, 2004   [place]     SINUS EXPLORATION  2013   SPINE SURGERY  2013   L C7-T1 laminectomy and discectomy   TONSILLECTOMY     Patient Active Problem List   Diagnosis Date Noted   Snoring 02/14/2022   Mild sleep apnea 02/12/2022   Carotid bruit 07/09/2021   Aneurysm of ascending aorta ([place]) 06/28/2020   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   CAD (coronary artery disease) 07/09/2018   CKD (chronic kidney disease), 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   Chest pain of uncertain etiology 02/58/5277   Chronic cough 06/21/2018   Thrombocytopenia ([place]) 12/01/2016   Transaminitis 82/42/3536    Diastolic dysfunction 14/43/1540   MDD (major depressive disorder), recurrent severe, without psychosis ([place]) 10/13/2015   Hyperlipidemia 08/24/2015   Arthritis    Depression    Hypertension    Aortic insufficiency    Psoriasis    Cervical disc disease    Lumbar disc disease    Asthma 01/06/2015   Psoriatic arthritis ([place]) 01/06/2015   Allergic rhinoconjunctivitis 01/06/2015   GERD (gastroesophageal reflux disease) 01/06/2015   Laryngopharyngeal reflux (LPR) 01/06/2015    PCP: No PCP  REFERRING PROVIDER: Victorino December  REFERRING DIAG: Z47.89   THERAPY DIAG:  Acute pain of right knee  Localized edema  Difficulty in walking, not elsewhere classified  Stiffness of right knee, not elsewhere classified  Rationale for Evaluation and Treatment Rehabilitation  ONSET DATE: 12/26/21  SUBJECTIVE:   SUBJECTIVE STATEMENT: Reports that he was a little sore after yesterday in the knee and laterally PERTINENT HISTORY: R meniscus repait post op 7 weeks  PAIN:  Are you having pain? Yes right knee 2310, "Its just their"  PRECAUTIONS: None  WEIGHT BEARING RESTRICTIONS No  FALLS:  Has patient fallen in last 6 months? No  LIVING ENVIRONMENT: Lives with: lives with their spouse Lives in: House/apartment Stairs: Yes: Internal: 12 steps; on right going up Has following equipment at home: Single point cane and Walker - 2  wheeled  OCCUPATION: Retired  PLOF: Independent  PATIENT GOALS go out and play pickleball    OBJECTIVE:   PATIENT SURVEYS:  [place]:  Overall cognitive status: Within functional limits for tasks assessed     SENSATION: WFL   MUSCLE LENGTH: Hamstrings: Right tightness limiting ROM   POSTURE: No Significant postural limitations and rounded shoulders  PALPATION: No TTP, some swelling in R knee   LOWER EXTREMITY ROM:  Active ROM Right eval 02/07/22 03/03/22 03/10/22 03/31/22 04/22/22  Hip flexion        Hip extension        Hip  abduction        Hip adduction        Hip internal rotation        Hip external rotation        Knee flexion 88 105 120 120 120 122  Knee extension -15 -_0 Ankle dorsiflexion        Ankle plantarflexion        Ankle inversion        Ankle eversion         (Blank rows = not tested)  LOWER EXTREMITY MMT:  MMT Right eval Left eval  Hip flexion 4   Hip extension    Hip abduction    Hip adduction    Hip internal rotation    Hip external rotation    Knee flexion 4   Knee extension 3+ w/pain   Ankle dorsiflexion    Ankle plantarflexion    Ankle inversion    Ankle eversion     (Blank rows = not tested)  FUNCTIONAL TESTS:  5 times sit to stand: 18.89s Timed up and go (TUG): 17.53s  02/24/22 12 seconds   04/22/22 8 seconds  GAIT: Distance walked: 55f Assistive device utilized: WEnvironmental consultant- 2 wheeled and able to do TUG without AD  Level of assistance: Modified independence Comments: decreased stance time on RLE, decreased step length, antalgic gait, decreased R knee flexion,     TODAY'S TREATMENT: 04/23/22 Bike level 5 x 7 minutes Tmill off push and pull 20 seconds x 3 each LEg press 60# 2x10, then left only 40# with green tband behind knee for TKE 10# knee extension 3x10 focus on TKE 25# leg curls 3x10 Hip extension 10# 2x10 Passive stretch, HS, ITB and quads  04/22/22 Nustep level 5 x 6 minutes LE's Bike level 5 x 6 minutes Two ways to stretch ITB at home HEP Quad stretch 4" and 6" side step ups and crossover step ups STM to the right ITB and into the HS  04/18/22 Nustep level 5 x 6 minutes 4" and 6" step ups Resisted gait STM to the right ITB and HS area PROM tot he leg  04/15/22 Passive ROM of the right LE gentle stretches STM with the theragun to the quad, ITB and HS Vaso high pressure 34 degrees right knee  04/08/22 Nustep level 5 x 5 minutes Bike Level 4 x 6 minutes Resisted gait 50# all motions 4in RLE eccentrics step downs 2x10 Leg  extension 10# 3x10, 5lb RLE 2x10 Leg curls 25# 2x10, then right only 20# 2x10 RLE SLS 15'' x4 Heel raises   PATIENT EDUCATION:  Education details: POC Person educated: Patient Education method: Explanation Education comprehension: verbalized understanding   HOME EXERCISE PROGRAM: Access Code: [email or web address] URL: [email or web address] Date: 04/22/2022 Prepared by: MLum Babe Exercises - Supine ITB Stretch with Strap  - 2  x daily - 7 x weekly - 1 sets - 5 reps - 30 hold - Standing ITB Stretch  - 2 x daily - 7 x weekly - 1 sets - 5 reps - 30 holdLAQ, SAQ, knee flexion seated- given from [place]:  CLINICAL IMPRESSION: Patient overall is doing well, he has had some set back with ITB issues, had an injection about 2 weeks ago.  He is still lacking some TKE and is very tight in the ITB and the quads, overall strength is much improved to 4+/5 for flexion and extension.  Has a slight limp, would like to get back to playing pickleball in the spring.  He has had some health issues with 2 ED visits in the past month due to dizziness and chest pain. REHAB POTENTIAL: Good  CLINICAL DECISION MAKING: Stable/uncomplicated  EVALUATION COMPLEXITY: Low   GOALS: Goals reviewed with patient? No  SHORT TERM GOALS: Target date: 03/12/22  Patient will be independent with initial HEP. Goal status:met   LONG TERM GOALS: Target date: 04/23/22  Patient will be independent with advanced/ongoing HEP to improve outcomes and carryover.  Goal status: ongoing  2.  Patient will report at least 75% improvement in R knee pain to improve QOL. Baseline: 3/10 pain Goal status: partially met  3.  Patient will demonstrate improved R knee AROM to >/= 5-120 deg to allow for normal gait and stair mechanics. Baseline: 15-0-88 Goal status: met at 5-120 degrees flexion  4.  Patient will demonstrate improved functional RLE strength as demonstrated by 5/5. Goal status:  progressing  5.  Patient will be able to ambulate 600' with normal gait pattern without increased pain to access community.  Baseline: walking with RW Goal status: met  6. Patient will be able to ascend/descend stairs with 1 HR and reciprocal step pattern safely to access home and community.  Baseline: using cane to navigate stairs Goal status: progressing  7.  Patient will report 43 on FOTO to demonstrate improved functional ability. Baseline: 35 at eval, 54 03/05/22 Goal status: partially met  PLAN: PT FREQUENCY: 2x/week  PT DURATION: 12 weeks  PLANNED INTERVENTIONS: Therapeutic exercises, Therapeutic activity, Neuromuscular re-education, Balance training, Gait training, Patient/Family education, Self Care, Joint mobilization, Stair training, Electrical stimulation, Cryotherapy, Taping, Vasopneumatic device, Ionotophoresis 70m/ml Dexamethasone, and Manual therapy  PLAN FOR NEXT SESSION: continue to work on the ROM to proress him to PLOF of paying pickleball and full ROM  Kaleya Douse W, PT 04/23/2022, 10:59 AM

## 2022-04-24 DIAGNOSIS — M542 Cervicalgia: Secondary | ICD-10-CM | POA: Diagnosis not present

## 2022-04-24 DIAGNOSIS — M5412 Radiculopathy, cervical region: Secondary | ICD-10-CM | POA: Diagnosis not present

## 2022-04-29 ENCOUNTER — Ambulatory Visit: Payer: Medicare Other | Attending: Orthopedic Surgery | Admitting: Physical Therapy

## 2022-04-29 ENCOUNTER — Encounter: Payer: Self-pay | Admitting: Physical Therapy

## 2022-04-29 DIAGNOSIS — M25561 Pain in right knee: Secondary | ICD-10-CM | POA: Diagnosis not present

## 2022-04-29 DIAGNOSIS — R600 Localized edema: Secondary | ICD-10-CM | POA: Diagnosis not present

## 2022-04-29 DIAGNOSIS — Z125 Encounter for screening for malignant neoplasm of prostate: Secondary | ICD-10-CM | POA: Diagnosis not present

## 2022-04-29 DIAGNOSIS — M25661 Stiffness of right knee, not elsewhere classified: Secondary | ICD-10-CM | POA: Diagnosis not present

## 2022-04-29 DIAGNOSIS — N281 Cyst of kidney, acquired: Secondary | ICD-10-CM | POA: Diagnosis not present

## 2022-04-29 DIAGNOSIS — R262 Difficulty in walking, not elsewhere classified: Secondary | ICD-10-CM | POA: Diagnosis not present

## 2022-04-29 DIAGNOSIS — R350 Frequency of micturition: Secondary | ICD-10-CM | POA: Diagnosis not present

## 2022-04-29 DIAGNOSIS — N5201 Erectile dysfunction due to arterial insufficiency: Secondary | ICD-10-CM | POA: Diagnosis not present

## 2022-04-29 NOTE — Therapy (Signed)
OUTPATIENT PHYSICAL THERAPY LOWER EXTREMITY TREATMENT    Patient Name: Arthur Moore MRN: [id number] DOB:03/13/1951, 72 y.o., male Today's Date: 04/29/2022   PT End of Session - 04/29/22 1101     Visit Number 21    Date for PT Re-Evaluation 05/31/22    Authorization Type Medicare    PT Start Time 1059    PT Stop Time 1145    PT Time Calculation (min) 46 min    Activity Tolerance Patient tolerated treatment well    Behavior During Therapy [facility] for tasks assessed/performed                Past Medical History:  Diagnosis Date   Aortic insufficiency    Arthritis    Asthma    Carotid bruit 07/09/2021   Cervical disc disease    Depression    Diastolic dysfunction 3/82/5053   Hyperlipidemia 08/24/2015   Hypertension    Lumbar disc disease    Psoriasis    Past Surgical History:  Procedure Laterality Date   CHOLECYSTECTOMY  2012   HERNIA REPAIR     LEFT HEART CATH AND CORONARY ANGIOGRAPHY N/A 07/08/2018   Procedure: LEFT HEART CATH AND CORONARY ANGIOGRAPHY;  Surgeon: Burnell Blanks, MD;  Location: [facility];  Service: Cardiovascular;  Laterality: N/A;   PAROTIDECTOMY Right 1992   PROSTATE SURGERY  2002, 2004   [place]     SINUS EXPLORATION  2013   SPINE SURGERY  2013   L C7-T1 laminectomy and discectomy   TONSILLECTOMY     Patient Active Problem List   Diagnosis Date Noted   Snoring 02/14/2022   Mild sleep apnea 02/12/2022   Carotid bruit 07/09/2021   Aneurysm of ascending aorta ([place]) 06/28/2020   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   CAD (coronary artery disease) 07/09/2018   CKD (chronic kidney disease), 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   Chest pain of uncertain etiology 97/67/3419   Chronic cough 06/21/2018   Thrombocytopenia ([place]) 12/01/2016   Transaminitis 37/90/2409   Diastolic dysfunction 73/53/2992   MDD (major depressive disorder), recurrent severe, without psychosis ([place]) 10/13/2015   Hyperlipidemia 08/24/2015    Arthritis    Depression    Hypertension    Aortic insufficiency    Psoriasis    Cervical disc disease    Lumbar disc disease    Asthma 01/06/2015   Psoriatic arthritis ([place]) 01/06/2015   Allergic rhinoconjunctivitis 01/06/2015   GERD (gastroesophageal reflux disease) 01/06/2015   Laryngopharyngeal reflux (LPR) 01/06/2015    PCP: No PCP  REFERRING PROVIDER: Victorino December  REFERRING DIAG: Z47.89   THERAPY DIAG:  Acute pain of right knee  Localized edema  Difficulty in walking, not elsewhere classified  Stiffness of right knee, not elsewhere classified  Rationale for Evaluation and Treatment Rehabilitation  ONSET DATE: 12/26/21  SUBJECTIVE:   SUBJECTIVE STATEMENT: I think I am walking better. PERTINENT HISTORY: R meniscus repait post op 7 weeks  PAIN:  Are you having pain? Yes right knee 2-3/10, "Its just their"  PRECAUTIONS: None  WEIGHT BEARING RESTRICTIONS No  FALLS:  Has patient fallen in last 6 months? No  LIVING ENVIRONMENT: Lives with: lives with their spouse Lives in: House/apartment Stairs: Yes: Internal: 12 steps; on right going up Has following equipment at home: Single point cane and Walker - 2 wheeled  OCCUPATION: Retired  PLOF: Independent  PATIENT GOALS go out and play pickleball    OBJECTIVE:   PATIENT SURVEYS:  FOTO 35/100  COGNITION:  Overall cognitive  status: Within functional limits for tasks assessed     SENSATION: WFL   MUSCLE LENGTH: Hamstrings: Right tightness limiting ROM   POSTURE: No Significant postural limitations and rounded shoulders  PALPATION: No TTP, some swelling in R knee   LOWER EXTREMITY ROM:  Active ROM Right eval 02/07/22 03/03/22 03/10/22 03/31/22 04/22/22  Hip flexion        Hip extension        Hip abduction        Hip adduction        Hip internal rotation        Hip external rotation        Knee flexion 88 105 120 120 120 122  Knee extension -15 -_0 Ankle dorsiflexion         Ankle plantarflexion        Ankle inversion        Ankle eversion         (Blank rows = not tested)  LOWER EXTREMITY MMT:  MMT Right eval Left eval  Hip flexion 4   Hip extension    Hip abduction    Hip adduction    Hip internal rotation    Hip external rotation    Knee flexion 4   Knee extension 3+ w/pain   Ankle dorsiflexion    Ankle plantarflexion    Ankle inversion    Ankle eversion     (Blank rows = not tested)  FUNCTIONAL TESTS:  5 times sit to stand: 18.89s Timed up and go (TUG): 17.53s  02/24/22 12 seconds   04/22/22 8 seconds  GAIT: Distance walked: 40f Assistive device utilized: WEnvironmental consultant- 2 wheeled and able to do TUG without AD  Level of assistance: Modified independence Comments: decreased stance time on RLE, decreased step length, antalgic gait, decreased R knee flexion,     TODAY'S TREATMENT: 04/29/22 Bike level 5 x 7 minutes Tmill push and pull 20s x 3 each Feet on ball K2C, bridges and isometric abs Leg press 60# both legs x 10, then single legs 30# 2x10 each On airex ball toss, eyes closed , head turns Airex balance beam side stepping and tandem walk Passive ITB and knee extension Calve stretch Green tband clamshells  04/23/22 Bike level 5 x 7 minutes Tmill off push and pull 20 seconds x 3 each LEg press 60# 2x10, then left only 40# with green tband behind knee for TKE 10# knee extension 3x10 focus on TKE 25# leg curls 3x10 Hip extension 10# 2x10 Passive stretch, HS, ITB and quads  04/22/22 Nustep level 5 x 6 minutes LE's Bike level 5 x 6 minutes Two ways to stretch ITB at home HEP Quad stretch 4" and 6" side step ups and crossover step ups STM to the right ITB and into the HS  04/18/22 Nustep level 5 x 6 minutes 4" and 6" step ups Resisted gait STM to the right ITB and HS area PROM tot he leg  PATIENT EDUCATION:  Education details: POC Person educated: Patient Education method: Explanation Education comprehension:  verbalized understanding   HOME EXERCISE PROGRAM: Access Code: [email or web address] URL: [email or web address] Date: 04/22/2022 Prepared by: MLum Babe Exercises - Supine ITB Stretch with Strap  - 2 x daily - 7 x weekly - 1 sets - 5 reps - 30 hold - Standing ITB Stretch  - 2 x daily - 7 x weekly - 1 sets - 5 reps - 30 holdLAQ, SAQ, knee flexion seated- given  from [place]:  CLINICAL IMPRESSION: Patient feels like he is doing better, still tight ITB and posterior knee but is improving, his balance is difficult with the SLS and very difficult on the dynamic surfaces, he is talking about playing pickleball next week REHAB POTENTIAL: Good  CLINICAL DECISION MAKING: Stable/uncomplicated  EVALUATION COMPLEXITY: Low   GOALS: Goals reviewed with patient? No  SHORT TERM GOALS: Target date: 03/12/22  Patient will be independent with initial HEP. Goal status:met   LONG TERM GOALS: Target date: 04/23/22  Patient will be independent with advanced/ongoing HEP to improve outcomes and carryover.  Goal status: ongoing  2.  Patient will report at least 75% improvement in R knee pain to improve QOL. Baseline: 3/10 pain Goal status: partially met  3.  Patient will demonstrate improved R knee AROM to >/= 5-120 deg to allow for normal gait and stair mechanics. Baseline: 15-0-88 Goal status: met at 5-120 degrees flexion  4.  Patient will demonstrate improved functional RLE strength as demonstrated by 5/5. Goal status: progressing  5.  Patient will be able to ambulate 600' with normal gait pattern without increased pain to access community.  Baseline: walking with RW Goal status: met  6. Patient will be able to ascend/descend stairs with 1 HR and reciprocal step pattern safely to access home and community.  Baseline: using cane to navigate stairs Goal status: progressing  7.  Patient will report 1 on FOTO to demonstrate improved functional ability. Baseline:  35 at eval, 54 03/05/22 Goal status: partially met  PLAN: PT FREQUENCY: 2x/week  PT DURATION: 12 weeks  PLANNED INTERVENTIONS: Therapeutic exercises, Therapeutic activity, Neuromuscular re-education, Balance training, Gait training, Patient/Family education, Self Care, Joint mobilization, Stair training, Electrical stimulation, Cryotherapy, Taping, Vasopneumatic device, Ionotophoresis 47m/ml Dexamethasone, and Manual therapy  PLAN FOR NEXT SESSION: continue to work on the ROM to proress him to PLOF of paying pickleball and full ROM  Arvil Utz W, PT 04/29/2022, 11:02 AM

## 2022-04-30 ENCOUNTER — Ambulatory Visit: Payer: Medicare Other | Admitting: Physical Therapy

## 2022-04-30 ENCOUNTER — Encounter: Payer: Self-pay | Admitting: Physical Therapy

## 2022-04-30 DIAGNOSIS — R262 Difficulty in walking, not elsewhere classified: Secondary | ICD-10-CM

## 2022-04-30 DIAGNOSIS — M25661 Stiffness of right knee, not elsewhere classified: Secondary | ICD-10-CM

## 2022-04-30 DIAGNOSIS — M25561 Pain in right knee: Secondary | ICD-10-CM

## 2022-04-30 DIAGNOSIS — R600 Localized edema: Secondary | ICD-10-CM | POA: Diagnosis not present

## 2022-04-30 NOTE — Therapy (Signed)
OUTPATIENT PHYSICAL THERAPY LOWER EXTREMITY TREATMENT    Patient Name: Arthur Moore MRN: [id number] DOB:1950/12/26, 72 y.o., male Today's Date: 04/30/2022   PT End of Session - 04/30/22 1814     Visit Number 22    Date for PT Re-Evaluation 05/31/22    Authorization Type Medicare    PT Start Time 5397    PT Stop Time 6734    PT Time Calculation (min) 46 min    Activity Tolerance Patient tolerated treatment well    Behavior During Therapy [facility] for tasks assessed/performed                Past Medical History:  Diagnosis Date   Aortic insufficiency    Arthritis    Asthma    Carotid bruit 07/09/2021   Cervical disc disease    Depression    Diastolic dysfunction 1/93/7902   Hyperlipidemia 08/24/2015   Hypertension    Lumbar disc disease    Psoriasis    Past Surgical History:  Procedure Laterality Date   CHOLECYSTECTOMY  2012   HERNIA REPAIR     LEFT HEART CATH AND CORONARY ANGIOGRAPHY N/A 07/08/2018   Procedure: LEFT HEART CATH AND CORONARY ANGIOGRAPHY;  Surgeon: Burnell Blanks, MD;  Location: [facility];  Service: Cardiovascular;  Laterality: N/A;   PAROTIDECTOMY Right 1992   PROSTATE SURGERY  2002, 2004   [place]     SINUS EXPLORATION  2013   SPINE SURGERY  2013   L C7-T1 laminectomy and discectomy   TONSILLECTOMY     Patient Active Problem List   Diagnosis Date Noted   Snoring 02/14/2022   Mild sleep apnea 02/12/2022   Carotid bruit 07/09/2021   Aneurysm of ascending aorta ([place]) 06/28/2020   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   CAD (coronary artery disease) 07/09/2018   CKD (chronic kidney disease), 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   Chest pain of uncertain etiology 40/97/3532   Chronic cough 06/21/2018   Thrombocytopenia ([place]) 12/01/2016   Transaminitis 99/24/2683   Diastolic dysfunction 41/96/2229   MDD (major depressive disorder), recurrent severe, without psychosis ([place]) 10/13/2015   Hyperlipidemia 08/24/2015    Arthritis    Depression    Hypertension    Aortic insufficiency    Psoriasis    Cervical disc disease    Lumbar disc disease    Asthma 01/06/2015   Psoriatic arthritis ([place]) 01/06/2015   Allergic rhinoconjunctivitis 01/06/2015   GERD (gastroesophageal reflux disease) 01/06/2015   Laryngopharyngeal reflux (LPR) 01/06/2015    PCP: No PCP  REFERRING PROVIDER: Victorino December  REFERRING DIAG: Z47.89   THERAPY DIAG:  Acute pain of right knee  Localized edema  Difficulty in walking, not elsewhere classified  Stiffness of right knee, not elsewhere classified  Rationale for Evaluation and Treatment Rehabilitation  ONSET DATE: 12/26/21  SUBJECTIVE:   SUBJECTIVE STATEMENT: Patient reports that he went to the gym for the first time in a long time, reports riding bike, swimming and doing some weights, he reports tired. PERTINENT HISTORY: R meniscus repait post op 7 weeks  PAIN:  Are you having pain? Yes right knee 2-3/10, "Its just their"  PRECAUTIONS: None  WEIGHT BEARING RESTRICTIONS No  FALLS:  Has patient fallen in last 6 months? No  LIVING ENVIRONMENT: Lives with: lives with their spouse Lives in: House/apartment Stairs: Yes: Internal: 12 steps; on right going up Has following equipment at home: Single point cane and Walker - 2 wheeled  OCCUPATION: Retired  PLOF: [place]  go out and play pickleball    OBJECTIVE:   PATIENT SURVEYS:  FOTO 35/100  COGNITION:  Overall cognitive status: Within functional limits for tasks assessed     SENSATION: WFL   MUSCLE LENGTH: Hamstrings: Right tightness limiting ROM   POSTURE: No Significant postural limitations and rounded shoulders  PALPATION: No TTP, some swelling in R knee   LOWER EXTREMITY ROM:  Active ROM Right eval 02/07/22 03/03/22 03/10/22 03/31/22 04/22/22  Hip flexion        Hip extension        Hip abduction        Hip adduction        Hip internal rotation        Hip  external rotation        Knee flexion 88 105 120 120 120 122  Knee extension -15 -_0 Ankle dorsiflexion        Ankle plantarflexion        Ankle inversion        Ankle eversion         (Blank rows = not tested)  LOWER EXTREMITY MMT:  MMT Right eval Right 04/30/22  Hip flexion 4   Hip extension    Hip abduction    Hip adduction    Hip internal rotation    Hip external rotation    Knee flexion 4 5-  Knee extension 3+ w/pain 4+  Ankle dorsiflexion    Ankle plantarflexion    Ankle inversion    Ankle eversion     (Blank rows = not tested)  FUNCTIONAL TESTS:  5 times sit to stand: 18.89s Timed up and go (TUG): 17.53s  02/24/22 12 seconds   04/22/22 8 seconds  GAIT: Distance walked: 52f Assistive device utilized: WEnvironmental consultant- 2 wheeled and able to do TUG without AD  Level of assistance: Modified independence Comments: decreased stance time on RLE, decreased step length, antalgic gait, decreased R knee flexion,     TODAY'S TREATMENT: 04/30/22 Passive stretch HS, ITB, piriformis, Quad, hip flexor  And calves STM with the theragun to the right ITB, quad and HS Vaso medium pressure 10 minutes in elevation  04/29/22 Bike level 5 x 7 minutes Tmill push and pull 20s x 3 each Feet on ball K2C, bridges and isometric abs Leg press 60# both legs x 10, then single legs 30# 2x10 each On airex ball toss, eyes closed , head turns Airex balance beam side stepping and tandem walk Passive ITB and knee extension Calve stretch Green tband clamshells  04/23/22 Bike level 5 x 7 minutes Tmill off push and pull 20 seconds x 3 each LEg press 60# 2x10, then left only 40# with green tband behind knee for TKE 10# knee extension 3x10 focus on TKE 25# leg curls 3x10 Hip extension 10# 2x10 Passive stretch, HS, ITB and quads  04/22/22 Nustep level 5 x 6 minutes LE's Bike level 5 x 6 minutes Two ways to stretch ITB at home HEP Quad stretch 4" and 6" side step ups and crossover step  ups STM to the right ITB and into the HS  04/18/22 Nustep level 5 x 6 minutes 4" and 6" step ups Resisted gait STM to the right ITB and HS area PROM tot he leg  PATIENT EDUCATION:  Education details: POC Person educated: Patient Education method: Explanation Education comprehension: verbalized understanding   HOME EXERCISE PROGRAM: Access Code: [email or web address] URL: [email or web address] Date: 04/22/2022 Prepared by: MLum Babe  Exercises - Supine ITB Stretch with Strap  - 2 x daily - 7 x weekly - 1 sets - 5 reps - 30 hold - Standing ITB Stretch  - 2 x daily - 7 x weekly - 1 sets - 5 reps - 30 holdLAQ, SAQ, knee flexion seated- given from [place]:  CLINICAL IMPRESSION: Patient went to the gym today and did some things, he reports fatigued and tight, we discussed safe return to the gym setting and not overdoing it as well as keeping a log so we know what if anything may increase pain and or symptoms.  HE is very tight and sore in the right ITB and the quads, he is about 8" from his heel touching his buttock on the right in prone passively and 2-3" away on the left REHAB POTENTIAL: Good  CLINICAL DECISION MAKING: Stable/uncomplicated  EVALUATION COMPLEXITY: Low   GOALS: Goals reviewed with patient? No  SHORT TERM GOALS: Target date: 03/12/22  Patient will be independent with initial HEP. Goal status:met   LONG TERM GOALS: Target date: 04/23/22  Patient will be independent with advanced/ongoing HEP to improve outcomes and carryover.  Goal status: progressing  2.  Patient will report at least 75% improvement in R knee pain to improve QOL. Baseline: 3/10 pain Goal status: partially met  3.  Patient will demonstrate improved R knee AROM to >/= 5-120 deg to allow for normal gait and stair mechanics. Baseline: 15-0-88 Goal status: met at 5-120 degrees flexion  4.  Patient will demonstrate improved functional RLE strength as demonstrated  by 5/5. Goal status: progressing  5.  Patient will be able to ambulate 600' with normal gait pattern without increased pain to access community.  Baseline: walking with RW Goal status: met  6. Patient will be able to ascend/descend stairs with 1 HR and reciprocal step pattern safely to access home and community.  Baseline: using cane to navigate stairs Goal status:  met  7.  Patient will report 31 on FOTO to demonstrate improved functional ability. Baseline: 35 at eval, 54 03/05/22 Goal status: partially met  PLAN: PT FREQUENCY: 2x/week  PT DURATION: 12 weeks  PLANNED INTERVENTIONS: Therapeutic exercises, Therapeutic activity, Neuromuscular re-education, Balance training, Gait training, Patient/Family education, Self Care, Joint mobilization, Stair training, Electrical stimulation, Cryotherapy, Taping, Vasopneumatic device, Ionotophoresis 73m/ml Dexamethasone, and Manual therapy  PLAN FOR NEXT SESSION: monitor what he is doing as he returns to the gym, work on the flexibility the quad is very tight  [employer] PT 04/30/2022, 6:15 PM

## 2022-05-05 ENCOUNTER — Encounter: Payer: Self-pay | Admitting: Physical Therapy

## 2022-05-05 DIAGNOSIS — R059 Cough, unspecified: Secondary | ICD-10-CM | POA: Diagnosis not present

## 2022-05-05 DIAGNOSIS — R051 Acute cough: Secondary | ICD-10-CM | POA: Diagnosis not present

## 2022-05-05 DIAGNOSIS — R52 Pain, unspecified: Secondary | ICD-10-CM | POA: Diagnosis not present

## 2022-05-05 DIAGNOSIS — J209 Acute bronchitis, unspecified: Secondary | ICD-10-CM | POA: Diagnosis not present

## 2022-05-05 DIAGNOSIS — R0981 Nasal congestion: Secondary | ICD-10-CM | POA: Diagnosis not present

## 2022-05-05 DIAGNOSIS — Z8709 Personal history of other diseases of the respiratory system: Secondary | ICD-10-CM | POA: Diagnosis not present

## 2022-05-05 DIAGNOSIS — Z03818 Encounter for observation for suspected exposure to other biological agents ruled out: Secondary | ICD-10-CM | POA: Diagnosis not present

## 2022-05-05 DIAGNOSIS — J029 Acute pharyngitis, unspecified: Secondary | ICD-10-CM | POA: Diagnosis not present

## 2022-05-06 ENCOUNTER — Ambulatory Visit: Payer: Medicare Other | Admitting: Physical Therapy

## 2022-05-07 ENCOUNTER — Encounter: Payer: Self-pay | Admitting: Physical Therapy

## 2022-05-08 DIAGNOSIS — B070 Plantar wart: Secondary | ICD-10-CM | POA: Diagnosis not present

## 2022-05-08 DIAGNOSIS — B078 Other viral warts: Secondary | ICD-10-CM | POA: Diagnosis not present

## 2022-05-09 ENCOUNTER — Ambulatory Visit (INDEPENDENT_AMBULATORY_CARE_PROVIDER_SITE_OTHER): Payer: Medicare Other | Admitting: Adult Health

## 2022-05-09 ENCOUNTER — Encounter: Payer: Self-pay | Admitting: Adult Health

## 2022-05-09 DIAGNOSIS — F331 Major depressive disorder, recurrent, moderate: Secondary | ICD-10-CM | POA: Diagnosis not present

## 2022-05-09 DIAGNOSIS — F4310 Post-traumatic stress disorder, unspecified: Secondary | ICD-10-CM

## 2022-05-09 DIAGNOSIS — F39 Unspecified mood [affective] disorder: Secondary | ICD-10-CM | POA: Diagnosis not present

## 2022-05-09 DIAGNOSIS — F411 Generalized anxiety disorder: Secondary | ICD-10-CM

## 2022-05-09 MED ORDER — CLONAZEPAM 0.5 MG PO TABS: 0.5000 mg | ORAL_TABLET | Freq: Three times a day (TID) | ORAL | 0 refills | Status: DC | PRN

## 2022-05-09 NOTE — Progress Notes (Signed)
Arthur Moore [id number] Feb 29, 1951 72 y.o.  Subjective:   Patient ID:  Arthur Moore is a 72 y.o. (DOB 12/27/1950) male.  Chief Complaint: No chief complaint on file.   HPI Shaheem Pichon presents to the office today for follow-up of Episodic Mood Disorder, PTSD, depression and GAD.  Accompanied by wife.  Describes mood today as "ok". Pleasant. Denies tearfulness. Denies depression. Feels like anxiety is more manageable. Feels irritable at times. Denies recent panic attacks. Mood is consistent. Stating "I feel like I'm doing better". Feels like the addition of Clonazepam has ben helpful to manage symptoms. Working with therapist Cornelia Copa. Varying interest and motivation. Taking medications as prescribed.  Energy levels stable. Active, unable to exercise.  Enjoys some usual interests and activities. Married. Lives with wife - 4 children between them. Spending time with family. Appetite stable. Weight stable - 185 to 190 pounds. Sleeps well at night. Averages 9 to 10 hours.  Focus and concentration stable. Completing tasks. Managing aspects of household. Retired - [place] veteran. Denies SI or HI.  Denies AH or VH. Denies self harm. Denies substance use.  Previous medication trials: Lexapro, Cymbalta, Xanax, Lamictal, Wellbutrin, Effexor, Paxil, Abilify, Pristiq, Vraylar,   PHQ2-9    Flowsheet Row [facility]  PHQ-2 Total Score 0      Flowsheet Row ED from 03/21/2022 in [facility] from 03/09/2022 in [facility] from 11/12/2021 in [place] No Risk No Risk No Risk        Review of Systems:  Review of Systems  Musculoskeletal:  Negative for gait problem.  Neurological:  Negative for tremors.  Psychiatric/Behavioral:         Please refer to HPI    Medications: I have reviewed the  patient's current medications.  Current Outpatient Medications  Medication Sig Dispense Refill   albuterol (VENTOLIN HFA) 108 (90 Base) MCG/ACT inhaler Inhale 2 puffs into the lungs every 4 (four) hours as needed for wheezing or shortness of breath. 18 each 1   aspirin EC 81 MG tablet Take 81 mg by mouth daily.     budesonide (RHINOCORT AQUA) 32 MCG/ACT nasal spray Use two sprays in each nostril once daily (Patient not taking: Reported on 02/12/2022) 8.43 mL 5   budesonide-formoterol (SYMBICORT) 160-4.5 MCG/ACT inhaler Inhale 2 puffs into the lungs 2 (two) times daily. 1 each 1   busPIRone (BUSPAR) 15 MG tablet Take 1 tablet (15 mg total) by mouth 3 (three) times daily. (Patient taking differently: Take 15 mg by mouth daily.) 270 tablet 3   cetirizine (ZYRTEC ALLERGY) 10 MG tablet Take 1 tablet (10 mg total) by mouth daily as needed (can take an ectra dose during flare ups). 60 tablet 5   Cholecalciferol (VITAMIN D3) 5000 units TABS Take 5,000 Units by mouth daily.      clonazePAM (KLONOPIN) 0.5 MG tablet Take 1 tablet (0.5 mg total) by mouth 3 (three) times daily as needed for anxiety. 90 tablet 0   fluticasone (FLONASE) 50 MCG/ACT nasal spray 1-2 sprays each nostril 1 (ONE) time per day. 16 g 2   lamoTRIgine (LAMICTAL) 200 MG tablet Take 1 tablet (200 mg total) by mouth 2 (two) times daily. 180 tablet 3   meclizine (ANTIVERT) 25 MG tablet Take 1 tablet (25 mg total) by mouth 3 (three) times daily as needed for dizziness. 20 tablet 0   metoprolol tartrate (  LOPRESSOR) 25 MG tablet TAKE 1/2 TABLET BY MOUTH TWICE A DAY 90 tablet 3   Multiple Vitamin (MULTIVITAMIN) capsule Take 1 capsule by mouth daily. (Patient not taking: Reported on 02/12/2022)     nitroGLYCERIN (NITROSTAT) 0.4 MG SL tablet Place 1 tablet (0.4 mg total) under the tongue every 5 (five) minutes as needed for chest pain. 25 tablet 3   pantoprazole (PROTONIX) 40 MG tablet Take 1 tablet by mouth 1-2 times per day. 180 tablet 1    Probiotic Product (PROBIOTIC DAILY PO) Take 1 tablet by mouth daily. (Patient not taking: Reported on 02/12/2022)     rosuvastatin (CRESTOR) 10 MG tablet Take 1 tablet by mouth daily.     zinc gluconate 50 MG tablet Take 1 tablet (50 mg total) by mouth daily.     No current facility-administered medications for this visit.    Medication Side Effects: None  Allergies:  Allergies  Allergen Reactions   Atorvastatin Other (See Comments)    Calf pain. Other reaction(s): leb numbness   Zolpidem Other (See Comments)    Other reaction(s): Drowsy    Past Medical History:  Diagnosis Date   Aortic insufficiency    Arthritis    Asthma    Carotid bruit 07/09/2021   Cervical disc disease    Depression    Diastolic dysfunction 1/61/0960   Hyperlipidemia 08/24/2015   Hypertension    Lumbar disc disease    Psoriasis     Past Medical History, Surgical history, Social history, and Family history were reviewed and updated as appropriate.   Please see review of systems for further details on the patient's review from today.   Objective:   Physical Exam:  There were no vitals taken for this visit.  Physical Exam Constitutional:      General: He is not in acute distress. Musculoskeletal:        General: No deformity.  Neurological:     Mental Status: He is alert and oriented to person, place, and time.     Coordination: Coordination normal.  Psychiatric:        Attention and Perception: Attention and perception normal. He does not perceive auditory or visual hallucinations.        Mood and Affect: Mood normal. Mood is not anxious or depressed. Affect is not labile, blunt, angry or inappropriate.        Speech: Speech normal.        Behavior: Behavior normal.        Thought Content: Thought content normal. Thought content is not paranoid or delusional. Thought content does not include homicidal or suicidal ideation. Thought content does not include homicidal or suicidal plan.         Cognition and Memory: Cognition and memory normal.        Judgment: Judgment normal.     Comments: Insight intact     Lab Review:     Component Value Date/Time   NA 141 03/21/2022 1524   NA 144 10/18/2020 1130   K 3.9 03/21/2022 1524   CL 110 03/21/2022 1524   CO2 23 03/21/2022 1524   GLUCOSE 113 (H) 03/21/2022 1524   BUN 20 03/21/2022 1524   BUN 20 10/18/2020 1130   CREATININE 1.33 (H) 03/21/2022 1524   CALCIUM 9.2 03/21/2022 1524   PROT 6.6 10/18/2020 1130   ALBUMIN 4.7 10/18/2020 1130   AST 20 10/18/2020 1130   ALT 21 10/18/2020 1130   ALKPHOS 89 10/18/2020 1130   BILITOT 0.7 10/18/2020  1130   GFRNONAA 58 (L) 03/21/2022 1524   GFRAA >60 01/01/2020 0324       Component Value Date/Time   WBC 8.1 03/21/2022 1524   RBC 5.18 03/21/2022 1524   HGB 15.7 03/21/2022 1524   HGB 16.7 12/09/2016 1537   HCT 45.1 03/21/2022 1524   HCT 46.9 12/09/2016 1537   PLT 139 (L) 03/21/2022 1524   PLT 141 12/09/2016 1537   MCV 87.1 03/21/2022 1524   MCV 87.3 12/09/2016 1537   MCH 30.3 03/21/2022 1524   MCHC 34.8 03/21/2022 1524   RDW 12.6 03/21/2022 1524   RDW 13.6 12/09/2016 1537   LYMPHSABS 1.6 03/10/2022 0111   LYMPHSABS 1.5 12/09/2016 1537   MONOABS 0.5 03/10/2022 0111   MONOABS 0.4 12/09/2016 1537   EOSABS 0.3 03/10/2022 0111   EOSABS 0.3 12/09/2016 1537   BASOSABS 0.0 03/10/2022 0111   BASOSABS 0.0 12/09/2016 1537    No results found for: "POCLITH", "LITHIUM"   No results found for: "PHENYTOIN", "PHENOBARB", "VALPROATE", "CBMZ"   .res Assessment: Plan:    Plan:  PDMP reviewed  Lamictal '200mg'$  twice daily Clonazepam 0.'5mg'$  TID  Seeing therapist.  Plans to follow up with PCP for symptoms as well  RTC 3 months  Patient advised to contact office with any questions, adverse effects, or acute worsening in signs and symptoms.  Counseled patient regarding potential benefits, risks, and side effects of Lamictal to include potential risk of Stevens-Johnson syndrome.  Advised patient to stop taking Lamictal and contact office immediately if rash develops and to seek urgent medical attention if rash is severe and/or spreading quickly.    Diagnoses and all orders for this visit:  GAD (generalized anxiety disorder) -     clonazePAM (KLONOPIN) 0.5 MG tablet; Take 1 tablet (0.5 mg total) by mouth 3 (three) times daily as needed for anxiety.  Major depressive disorder, recurrent episode, moderate (HCC)  Episodic mood disorder (HCC)  Posttraumatic stress disorder with delayed expression     Please see After Visit Summary for patient specific instructions.  Future Appointments  Date Time Provider [place]  05/12/2022  2:00 PM Sumner Boast, PT [facility]  05/14/2022  2:00 PM Sumner Boast, PT [facility]  05/19/2022 11:00 AM Sumner Boast, PT [facility]  05/21/2022  1:15 PM Sumner Boast, PT [facility]  05/26/2022 11:00 AM Sumner Boast, PT [facility]  06/25/2022  1:00 PM Blanchie Serve, PhD [facility] None  07/14/2022  2:20 PM Skeet Latch, MD [facility]    No orders of the defined types were placed in this encounter.   -------------------------------

## 2022-05-10 DIAGNOSIS — R051 Acute cough: Secondary | ICD-10-CM | POA: Diagnosis not present

## 2022-05-12 ENCOUNTER — Encounter: Payer: Self-pay | Admitting: Physical Therapy

## 2022-05-12 DIAGNOSIS — M25561 Pain in right knee: Secondary | ICD-10-CM | POA: Diagnosis not present

## 2022-05-12 DIAGNOSIS — M542 Cervicalgia: Secondary | ICD-10-CM | POA: Diagnosis not present

## 2022-05-14 ENCOUNTER — Encounter: Payer: Self-pay | Admitting: Physical Therapy

## 2022-05-14 ENCOUNTER — Ambulatory Visit: Payer: Medicare Other | Admitting: Physical Therapy

## 2022-05-14 DIAGNOSIS — Z6826 Body mass index (BMI) 26.0-26.9, adult: Secondary | ICD-10-CM | POA: Diagnosis not present

## 2022-05-14 DIAGNOSIS — M25661 Stiffness of right knee, not elsewhere classified: Secondary | ICD-10-CM | POA: Diagnosis not present

## 2022-05-14 DIAGNOSIS — R262 Difficulty in walking, not elsewhere classified: Secondary | ICD-10-CM

## 2022-05-14 DIAGNOSIS — R600 Localized edema: Secondary | ICD-10-CM

## 2022-05-14 DIAGNOSIS — M4722 Other spondylosis with radiculopathy, cervical region: Secondary | ICD-10-CM | POA: Diagnosis not present

## 2022-05-14 DIAGNOSIS — M25561 Pain in right knee: Secondary | ICD-10-CM

## 2022-05-14 NOTE — Therapy (Signed)
OUTPATIENT PHYSICAL THERAPY LOWER EXTREMITY TREATMENT    Patient Name: Arthur Moore MRN: [id number] DOB:February 08, 1951, 72 y.o., male Today's Date: 05/14/2022   PT End of Session - 05/14/22 1345     Visit Number 23    Date for PT Re-Evaluation 05/31/22    Authorization Type Medicare    PT Start Time 1345    PT Stop Time 1430    PT Time Calculation (min) 45 min    Activity Tolerance Patient tolerated treatment well    Behavior During Therapy [facility] for tasks assessed/performed                Past Medical History:  Diagnosis Date   Aortic insufficiency    Arthritis    Asthma    Carotid bruit 07/09/2021   Cervical disc disease    Depression    Diastolic dysfunction 7/82/4235   Hyperlipidemia 08/24/2015   Hypertension    Lumbar disc disease    Psoriasis    Past Surgical History:  Procedure Laterality Date   CHOLECYSTECTOMY  2012   HERNIA REPAIR     LEFT HEART CATH AND CORONARY ANGIOGRAPHY N/A 07/08/2018   Procedure: LEFT HEART CATH AND CORONARY ANGIOGRAPHY;  Surgeon: Burnell Blanks, MD;  Location: [facility];  Service: Cardiovascular;  Laterality: N/A;   PAROTIDECTOMY Right 1992   PROSTATE SURGERY  2002, 2004   [place]     SINUS EXPLORATION  2013   SPINE SURGERY  2013   L C7-T1 laminectomy and discectomy   TONSILLECTOMY     Patient Active Problem List   Diagnosis Date Noted   Snoring 02/14/2022   Mild sleep apnea 02/12/2022   Carotid bruit 07/09/2021   Aneurysm of ascending aorta ([place]) 06/28/2020   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   CAD (coronary artery disease) 07/09/2018   CKD (chronic kidney disease), 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   Chest pain of uncertain etiology 36/14/4315   Chronic cough 06/21/2018   Thrombocytopenia ([place]) 12/01/2016   Transaminitis 40/11/6759   Diastolic dysfunction 95/12/3265   MDD (major depressive disorder), recurrent severe, without psychosis ([place]) 10/13/2015   Hyperlipidemia 08/24/2015    Arthritis    Depression    Hypertension    Aortic insufficiency    Psoriasis    Cervical disc disease    Lumbar disc disease    Asthma 01/06/2015   Psoriatic arthritis ([place]) 01/06/2015   Allergic rhinoconjunctivitis 01/06/2015   GERD (gastroesophageal reflux disease) 01/06/2015   Laryngopharyngeal reflux (LPR) 01/06/2015    PCP: No PCP  REFERRING PROVIDER: Victorino December  REFERRING DIAG: Z47.89   THERAPY DIAG:  Acute pain of right knee  Localized edema  Difficulty in walking, not elsewhere classified  Stiffness of right knee, not elsewhere classified  Rationale for Evaluation and Treatment Rehabilitation  ONSET DATE: 12/26/21  SUBJECTIVE:   SUBJECTIVE STATEMENT: Patient reports that he was very sick over the past few weeks, reports that he was having some difficulty breathing and just did not feel good, knee is okay, reports some pain in the ITB and the HS PERTINENT HISTORY: R meniscus repait post op 7 weeks  PAIN:  Are you having pain? Yes right knee 2-3/10, "Its just their"  PRECAUTIONS: None  WEIGHT BEARING RESTRICTIONS No  FALLS:  Has patient fallen in last 6 months? No  LIVING ENVIRONMENT: Lives with: lives with their spouse Lives in: House/apartment Stairs: Yes: Internal: 12 steps; on right going up Has following equipment at home: Single point cane and Walker -  2 wheeled  OCCUPATION: Retired  PLOF: Independent  PATIENT GOALS go out and play pickleball    OBJECTIVE:   PATIENT SURVEYS:  Thomas:  Overall cognitive status: Within functional limits for tasks assessed     SENSATION: WFL   MUSCLE LENGTH: Hamstrings: Right tightness limiting ROM   POSTURE: No Significant postural limitations and rounded shoulders  PALPATION: No TTP, some swelling in R knee   LOWER EXTREMITY ROM:  Active ROM Right eval 02/07/22 03/03/22 03/10/22 03/31/22 04/22/22  Hip flexion        Hip extension        Hip abduction        Hip  adduction        Hip internal rotation        Hip external rotation        Knee flexion 88 105 120 120 120 122  Knee extension -15 -'10 7 5 10 6  '$ Ankle dorsiflexion        Ankle plantarflexion        Ankle inversion        Ankle eversion         (Blank rows = not tested)  LOWER EXTREMITY MMT:  MMT Right eval Right 04/30/22  Hip flexion 4   Hip extension    Hip abduction    Hip adduction    Hip internal rotation    Hip external rotation    Knee flexion 4 5-  Knee extension 3+ w/pain 4+  Ankle dorsiflexion    Ankle plantarflexion    Ankle inversion    Ankle eversion     (Blank rows = not tested)  FUNCTIONAL TESTS:  5 times sit to stand: 18.89s Timed up and go (TUG): 17.53s  02/24/22 12 seconds   04/22/22 8 seconds  GAIT: Distance walked: 48f Assistive device utilized: WEnvironmental consultant- 2 wheeled and able to do TUG without AD  Level of assistance: Modified independence Comments: decreased stance time on RLE, decreased step length, antalgic gait, decreased R knee flexion,     TODAY'S TREATMENT: 05/14/22 Bike LEvel 4 x 7 minutes Leg extension 5# right only 2x10 Leg curls 25# 2x10 Leg press 40# 2x10 then right only 20#  Resisted gait all directions 40# PROM of the right LE STM to the right HS and the ITB AROM of the right knee 5-127 degrees flexion  04/30/22 Passive stretch HS, ITB, piriformis, Quad, hip flexor  And calves STM with the theragun to the right ITB, quad and HS Vaso medium pressure 10 minutes in elevation  04/29/22 Bike level 5 x 7 minutes Tmill push and pull 20s x 3 each Feet on ball K2C, bridges and isometric abs Leg press 60# both legs x 10, then single legs 30# 2x10 each On airex ball toss, eyes closed , head turns Airex balance beam side stepping and tandem walk Passive ITB and knee extension Calve stretch Green tband clamshells  04/23/22 Bike level 5 x 7 minutes Tmill off push and pull 20 seconds x 3 each LEg press 60# 2x10, then left only 40#  with green tband behind knee for TKE 10# knee extension 3x10 focus on TKE 25# leg curls 3x10 Hip extension 10# 2x10 Passive stretch, HS, ITB and quads  04/22/22 Nustep level 5 x 6 minutes LE's Bike level 5 x 6 minutes Two ways to stretch ITB at home HEP Quad stretch 4" and 6" side step ups and crossover step ups STM to the right ITB and  into the HS  04/18/22 Nustep level 5 x 6 minutes 4" and 6" step ups Resisted gait STM to the right ITB and HS area PROM tot he leg  PATIENT EDUCATION:  Education details: POC Person educated: Patient Education method: Explanation Education comprehension: verbalized understanding   HOME EXERCISE PROGRAM: Access Code: [email or web address] URL: [email or web address] Date: 04/22/2022 Prepared by: Lum Babe  Exercises - Supine ITB Stretch with Strap  - 2 x daily - 7 x weekly - 1 sets - 5 reps - 30 hold - Standing ITB Stretch  - 2 x daily - 7 x weekly - 1 sets - 5 reps - 30 holdLAQ, SAQ, knee flexion seated- given from [place]:  CLINICAL IMPRESSION: Patient reports that he got sick and has not felt good and or been to PT in the past two weeks.  He reports that he feels very weak and a little stiff.  He still lacks a little bit of the full extension, flexion is WNL's, reports a little pain posterior and laterally  REHAB POTENTIAL: Good  CLINICAL DECISION MAKING: Stable/uncomplicated  EVALUATION COMPLEXITY: Low   GOALS: Goals reviewed with patient? No  SHORT TERM GOALS: Target date: 03/12/22  Patient will be independent with initial HEP. Goal status:met   LONG TERM GOALS: Target date: 04/23/22  Patient will be independent with advanced/ongoing HEP to improve outcomes and carryover.  Goal status: progressing  2.  Patient will report at least 75% improvement in R knee pain to improve QOL. Baseline: 3/10 pain Goal status: partially met  3.  Patient will demonstrate improved R knee AROM to >/= 5-120 deg  to allow for normal gait and stair mechanics. Baseline: 15-0-88 Goal status: met at 5-120 degrees flexion  4.  Patient will demonstrate improved functional RLE strength as demonstrated by 5/5. Goal status: progressing  5.  Patient will be able to ambulate 600' with normal gait pattern without increased pain to access community.  Baseline: walking with RW Goal status: met  6. Patient will be able to ascend/descend stairs with 1 HR and reciprocal step pattern safely to access home and community.  Baseline: using cane to navigate stairs Goal status:  met  7.  Patient will report 32 on FOTO to demonstrate improved functional ability. Baseline: 35 at eval, 54 03/05/22 Goal status: partially met  PLAN: PT FREQUENCY: 2x/week  PT DURATION: 12 weeks  PLANNED INTERVENTIONS: Therapeutic exercises, Therapeutic activity, Neuromuscular re-education, Balance training, Gait training, Patient/Family education, Self Care, Joint mobilization, Stair training, Electrical stimulation, Cryotherapy, Taping, Vasopneumatic device, Ionotophoresis '4mg'$ /ml Dexamethasone, and Manual therapy  PLAN FOR NEXT SESSION: add some weight and activity, went light today due to him being sick the past two weeks Nathalia Wismer W, PT 05/14/2022, 1:45 PM

## 2022-05-19 ENCOUNTER — Ambulatory Visit: Payer: Medicare Other | Admitting: Physical Therapy

## 2022-05-19 ENCOUNTER — Encounter: Payer: Self-pay | Admitting: Physical Therapy

## 2022-05-19 DIAGNOSIS — R600 Localized edema: Secondary | ICD-10-CM | POA: Diagnosis not present

## 2022-05-19 DIAGNOSIS — M25561 Pain in right knee: Secondary | ICD-10-CM | POA: Diagnosis not present

## 2022-05-19 DIAGNOSIS — M25661 Stiffness of right knee, not elsewhere classified: Secondary | ICD-10-CM

## 2022-05-19 DIAGNOSIS — R262 Difficulty in walking, not elsewhere classified: Secondary | ICD-10-CM

## 2022-05-19 NOTE — Therapy (Signed)
OUTPATIENT PHYSICAL THERAPY LOWER EXTREMITY TREATMENT    Patient Name: Arthur Moore MRN: [id number] DOB:May 10, 1950, 72 y.o., male Today's Date: 05/19/2022   PT End of Session - 05/19/22 1101     Visit Number 24    Date for PT Re-Evaluation 05/31/22    Authorization Type Medicare    PT Start Time 1058    PT Stop Time 1143    PT Time Calculation (min) 45 min    Activity Tolerance Patient tolerated treatment well    Behavior During Therapy [facility] for tasks assessed/performed                Past Medical History:  Diagnosis Date   Aortic insufficiency    Arthritis    Asthma    Carotid bruit 07/09/2021   Cervical disc disease    Depression    Diastolic dysfunction 07/17/252   Hyperlipidemia 08/24/2015   Hypertension    Lumbar disc disease    Psoriasis    Past Surgical History:  Procedure Laterality Date   CHOLECYSTECTOMY  2012   HERNIA REPAIR     LEFT HEART CATH AND CORONARY ANGIOGRAPHY N/A 07/08/2018   Procedure: LEFT HEART CATH AND CORONARY ANGIOGRAPHY;  Surgeon: Burnell Blanks, MD;  Location: [place] [facility];  Service: Cardiovascular;  Laterality: N/A;   PAROTIDECTOMY Right 1992   PROSTATE SURGERY  2002, 2004   [place]     SINUS EXPLORATION  2013   SPINE SURGERY  2013   L C7-T1 laminectomy and discectomy   TONSILLECTOMY     Patient Active Problem List   Diagnosis Date Noted   Snoring 02/14/2022   Mild sleep apnea 02/12/2022   Carotid bruit 07/09/2021   Aneurysm of ascending aorta ([place]) 06/28/2020   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   CAD (coronary artery disease) 07/09/2018   CKD (chronic kidney disease), 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   Chest pain of uncertain etiology 27/09/2374   Chronic cough 06/21/2018   Thrombocytopenia ([place]) 12/01/2016   Transaminitis 28/31/5176   Diastolic dysfunction 16/10/3708   MDD (major depressive disorder), recurrent severe, without psychosis ([place]) 10/13/2015   Hyperlipidemia 08/24/2015    Arthritis    Depression    Hypertension    Aortic insufficiency    Psoriasis    Cervical disc disease    Lumbar disc disease    Asthma 01/06/2015   Psoriatic arthritis ([place]) 01/06/2015   Allergic rhinoconjunctivitis 01/06/2015   GERD (gastroesophageal reflux disease) 01/06/2015   Laryngopharyngeal reflux (LPR) 01/06/2015    PCP: No PCP  REFERRING PROVIDER: Victorino December  REFERRING DIAG: Z47.89   THERAPY DIAG:  Acute pain of right knee  Localized edema  Difficulty in walking, not elsewhere classified  Stiffness of right knee, not elsewhere classified  Rationale for Evaluation and Treatment Rehabilitation  ONSET DATE: 12/26/21  SUBJECTIVE:   SUBJECTIVE STATEMENT: Patient reports that he started going back to the gym a little now that he is over his illness, reports some fear of the knee giving out PERTINENT HISTORY: R meniscus repait post op 7 weeks  PAIN:  Are you having pain? Yes right knee 2-3/10, "Its just their"  PRECAUTIONS: None  WEIGHT BEARING RESTRICTIONS No  FALLS:  Has patient fallen in last 6 months? No  LIVING ENVIRONMENT: Lives with: lives with their spouse Lives in: House/apartment Stairs: Yes: Internal: 12 steps; on right going up Has following equipment at home: Single point cane and Walker - 2 wheeled  OCCUPATION: Retired  PLOF: [place]  go out and play pickleball    OBJECTIVE:   PATIENT SURVEYS:  FOTO 35/100  COGNITION:  Overall cognitive status: Within functional limits for tasks assessed     SENSATION: WFL   MUSCLE LENGTH: Hamstrings: Right tightness limiting ROM   POSTURE: No Significant postural limitations and rounded shoulders  PALPATION: No TTP, some swelling in R knee   LOWER EXTREMITY ROM:  Active ROM Right eval 02/07/22 03/03/22 03/10/22 03/31/22 04/22/22  Hip flexion        Hip extension        Hip abduction        Hip adduction        Hip internal rotation        Hip external  rotation        Knee flexion 88 105 120 120 120 122  Knee extension -15 -'10 7 5 10 6  '$ Ankle dorsiflexion        Ankle plantarflexion        Ankle inversion        Ankle eversion         (Blank rows = not tested)  LOWER EXTREMITY MMT:  MMT Right eval Right 04/30/22  Hip flexion 4   Hip extension    Hip abduction    Hip adduction    Hip internal rotation    Hip external rotation    Knee flexion 4 5-  Knee extension 3+ w/pain 4+  Ankle dorsiflexion    Ankle plantarflexion    Ankle inversion    Ankle eversion     (Blank rows = not tested)  FUNCTIONAL TESTS:  5 times sit to stand: 18.89s Timed up and go (TUG): 17.53s  02/24/22 12 seconds   04/22/22 8 seconds  GAIT: Distance walked: 59f Assistive device utilized: WEnvironmental consultant- 2 wheeled and able to do TUG without AD  Level of assistance: Modified independence Comments: decreased stance time on RLE, decreased step length, antalgic gait, decreased R knee flexion,     TODAY'S TREATMENT: 05/19/22 Bike level 4.5 x 7 minutes Calf stretch Leg curls 35# 3x10, 25# right only 2x10 5# leg extension 3x10 cues for TKE Tmill push and pull 20s x3 each Leg press 60# 3x10, then right only 20# 2x10 Passive LE stretches Volleyball with some lateral movements  05/14/22 Bike LEvel 4 x 7 minutes Leg extension 5# right only 2x10 Leg curls 25# 2x10 Leg press 40# 2x10 then right only 20#  Resisted gait all directions 40# PROM of the right LE STM to the right HS and the ITB AROM of the right knee 5-127 degrees flexion  04/30/22 Passive stretch HS, ITB, piriformis, Quad, hip flexor  And calves STM with the theragun to the right ITB, quad and HS Vaso medium pressure 10 minutes in elevation  04/29/22 Bike level 5 x 7 minutes Tmill push and pull 20s x 3 each Feet on ball K2C, bridges and isometric abs Leg press 60# both legs x 10, then single legs 30# 2x10 each On airex ball toss, eyes closed , head turns Airex balance beam side stepping  and tandem walk Passive ITB and knee extension Calve stretch Green tband clamshells  04/23/22 Bike level 5 x 7 minutes Tmill off push and pull 20 seconds x 3 each LEg press 60# 2x10, then left only 40# with green tband behind knee for TKE 10# knee extension 3x10 focus on TKE 25# leg curls 3x10 Hip extension 10# 2x10 Passive stretch, HS, ITB and quads  PATIENT EDUCATION:  Education details: POC Person educated: Patient Education method: Explanation Education comprehension: verbalized understanding   HOME EXERCISE PROGRAM: Access Code: [email or web address] URL: [email or web address] Date: 04/22/2022 Prepared by: Lum Babe  Exercises - Supine ITB Stretch with Strap  - 2 x daily - 7 x weekly - 1 sets - 5 reps - 30 hold - Standing ITB Stretch  - 2 x daily - 7 x weekly - 1 sets - 5 reps - 30 holdLAQ, SAQ, knee flexion seated- given from [place]:  CLINICAL IMPRESSION: Patient has some fear of lateral movements, tells me he thinks it may give.  He fatigues easily and is tight in the ITB and HS  REHAB POTENTIAL: Good  CLINICAL DECISION MAKING: Stable/uncomplicated  EVALUATION COMPLEXITY: Low   GOALS: Goals reviewed with patient? No  SHORT TERM GOALS: Target date: 03/12/22  Patient will be independent with initial HEP. Goal status:met   LONG TERM GOALS: Target date: 04/23/22  Patient will be independent with advanced/ongoing HEP to improve outcomes and carryover.  Goal status: progressing  2.  Patient will report at least 75% improvement in R knee pain to improve QOL. Baseline: 3/10 pain Goal status: partially met  3.  Patient will demonstrate improved R knee AROM to >/= 5-120 deg to allow for normal gait and stair mechanics. Baseline: 15-0-88 Goal status: met at 5-120 degrees flexion  4.  Patient will demonstrate improved functional RLE strength as demonstrated by 5/5. Goal status: progressing  5.  Patient will be able to ambulate  600' with normal gait pattern without increased pain to access community.  Baseline: walking with RW Goal status: met  6. Patient will be able to ascend/descend stairs with 1 HR and reciprocal step pattern safely to access home and community.  Baseline: using cane to navigate stairs Goal status:  met  7.  Patient will report 79 on FOTO to demonstrate improved functional ability. Baseline: 35 at eval, 54 03/05/22 Goal status: partially met  PLAN: PT FREQUENCY: 2x/week  PT DURATION: 12 weeks  PLANNED INTERVENTIONS: Therapeutic exercises, Therapeutic activity, Neuromuscular re-education, Balance training, Gait training, Patient/Family education, Self Care, Joint mobilization, Stair training, Electrical stimulation, Cryotherapy, Taping, Vasopneumatic device, Ionotophoresis '4mg'$ /ml Dexamethasone, and Manual therapy  PLAN FOR NEXT SESSION: Continue to add and return to gym Laurie Lovejoy W, PT 05/19/2022, 11:03 AM

## 2022-05-20 ENCOUNTER — Ambulatory Visit (INDEPENDENT_AMBULATORY_CARE_PROVIDER_SITE_OTHER): Payer: Medicare Other

## 2022-05-20 DIAGNOSIS — J309 Allergic rhinitis, unspecified: Secondary | ICD-10-CM

## 2022-05-21 ENCOUNTER — Encounter: Payer: Self-pay | Admitting: Physical Therapy

## 2022-05-21 ENCOUNTER — Ambulatory Visit: Payer: Medicare Other | Admitting: Physical Therapy

## 2022-05-21 DIAGNOSIS — R600 Localized edema: Secondary | ICD-10-CM

## 2022-05-21 DIAGNOSIS — R262 Difficulty in walking, not elsewhere classified: Secondary | ICD-10-CM

## 2022-05-21 DIAGNOSIS — M25661 Stiffness of right knee, not elsewhere classified: Secondary | ICD-10-CM | POA: Diagnosis not present

## 2022-05-21 DIAGNOSIS — M25561 Pain in right knee: Secondary | ICD-10-CM | POA: Diagnosis not present

## 2022-05-21 NOTE — Therapy (Signed)
OUTPATIENT PHYSICAL THERAPY LOWER EXTREMITY TREATMENT    Patient Name: Arthur Moore MRN: [id number] DOB:1950/09/02, 72 y.o., male Today's Date: 05/21/2022   PT End of Session - 05/21/22 1318     Visit Number 25    Date for PT Re-Evaluation 05/31/22    Authorization Type Medicare    PT Start Time 1315    PT Stop Time 1400    PT Time Calculation (min) 45 min    Activity Tolerance Patient tolerated treatment well    Behavior During Therapy [facility] for tasks assessed/performed                Past Medical History:  Diagnosis Date   Aortic insufficiency    Arthritis    Asthma    Carotid bruit 07/09/2021   Cervical disc disease    Depression    Diastolic dysfunction 12/24/9369   Hyperlipidemia 08/24/2015   Hypertension    Lumbar disc disease    Psoriasis    Past Surgical History:  Procedure Laterality Date   CHOLECYSTECTOMY  2012   HERNIA REPAIR     LEFT HEART CATH AND CORONARY ANGIOGRAPHY N/A 07/08/2018   Procedure: LEFT HEART CATH AND CORONARY ANGIOGRAPHY;  Surgeon: Burnell Blanks, MD;  Location: [place] [facility];  Service: Cardiovascular;  Laterality: N/A;   PAROTIDECTOMY Right 1992   PROSTATE SURGERY  2002, 2004   [place]     SINUS EXPLORATION  2013   SPINE SURGERY  2013   L C7-T1 laminectomy and discectomy   TONSILLECTOMY     Patient Active Problem List   Diagnosis Date Noted   Snoring 02/14/2022   Mild sleep apnea 02/12/2022   Carotid bruit 07/09/2021   Aneurysm of ascending aorta ([place]) 06/28/2020   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   CAD (coronary artery disease) 07/09/2018   CKD (chronic kidney disease), 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   Chest pain of uncertain etiology 69/67/8938   Chronic cough 06/21/2018   Thrombocytopenia ([place]) 12/01/2016   Transaminitis 02/11/5101   Diastolic dysfunction 58/52/7782   MDD (major depressive disorder), recurrent severe, without psychosis ([place]) 10/13/2015   Hyperlipidemia 08/24/2015    Arthritis    Depression    Hypertension    Aortic insufficiency    Psoriasis    Cervical disc disease    Lumbar disc disease    Asthma 01/06/2015   Psoriatic arthritis ([place]) 01/06/2015   Allergic rhinoconjunctivitis 01/06/2015   GERD (gastroesophageal reflux disease) 01/06/2015   Laryngopharyngeal reflux (LPR) 01/06/2015    PCP: No PCP  REFERRING PROVIDER: Victorino December  REFERRING DIAG: Z47.89   THERAPY DIAG:  Acute pain of right knee  Localized edema  Difficulty in walking, not elsewhere classified  Stiffness of right knee, not elsewhere classified  Rationale for Evaluation and Treatment Rehabilitation  ONSET DATE: 12/26/21  SUBJECTIVE:   SUBJECTIVE STATEMENT: Patient still reporting the distal ITB issue, no problems after the last visit PERTINENT HISTORY: R meniscus repait post op 7 weeks  PAIN:  Are you having pain? Yes right knee 2-3/10, "Its just their"  PRECAUTIONS: None  WEIGHT BEARING RESTRICTIONS No  FALLS:  Has patient fallen in last 6 months? No  LIVING ENVIRONMENT: Lives with: lives with their spouse Lives in: House/apartment Stairs: Yes: Internal: 12 steps; on right going up Has following equipment at home: Single point cane and Walker - 2 wheeled  OCCUPATION: Retired  PLOF: Independent  PATIENT GOALS go out and play pickleball    OBJECTIVE:   PATIENT SURVEYS:  FOTO 35/100  COGNITION:  Overall cognitive status: Within functional limits for tasks assessed     SENSATION: WFL   MUSCLE LENGTH: Hamstrings: Right tightness limiting ROM   POSTURE: No Significant postural limitations and rounded shoulders  PALPATION: No TTP, some swelling in R knee   LOWER EXTREMITY ROM:  Active ROM Right eval 02/07/22 03/03/22 03/10/22 03/31/22 04/22/22  Hip flexion        Hip extension        Hip abduction        Hip adduction        Hip internal rotation        Hip external rotation        Knee flexion 88 105 120 120 120 122  Knee  extension -15 -'10 7 5 10 6  '$ Ankle dorsiflexion        Ankle plantarflexion        Ankle inversion        Ankle eversion         (Blank rows = not tested)  LOWER EXTREMITY MMT:  MMT Right eval Right 04/30/22  Hip flexion 4   Hip extension    Hip abduction    Hip adduction    Hip internal rotation    Hip external rotation    Knee flexion 4 5-  Knee extension 3+ w/pain 4+  Ankle dorsiflexion    Ankle plantarflexion    Ankle inversion    Ankle eversion     (Blank rows = not tested)  FUNCTIONAL TESTS:  5 times sit to stand: 18.89s Timed up and go (TUG): 17.53s  02/24/22 12 seconds   04/22/22 8 seconds  GAIT: Distance walked: 74f Assistive device utilized: WEnvironmental consultant- 2 wheeled and able to do TUG without AD  Level of assistance: Modified independence Comments: decreased stance time on RLE, decreased step length, antalgic gait, decreased R knee flexion,     TODAY'S TREATMENT: 05/21/22 Bike level 5 x 7 minutes with 4x power burst Calf stretch Tmill push and pull 20s 4x each Volleyball with some lateral motions Fast pace walk around the back building On bosu balance, reaching, head turns Leg extension 10# right only Leg press right only 20# Leg curls 25# right only 3x10  05/19/22 Bike level 4.5 x 7 minutes Calf stretch Leg curls 35# 3x10, 25# right only 2x10 5# leg extension 3x10 cues for TKE Tmill push and pull 20s x3 each Leg press 60# 3x10, then right only 20# 2x10 Passive LE stretches Volleyball with some lateral movements  05/14/22 Bike LEvel 4 x 7 minutes Leg extension 5# right only 2x10 Leg curls 25# 2x10 Leg press 40# 2x10 then right only 20#  Resisted gait all directions 40# PROM of the right LE STM to the right HS and the ITB AROM of the right knee 5-127 degrees flexion  04/30/22 Passive stretch HS, ITB, piriformis, Quad, hip flexor  And calves STM with the theragun to the right ITB, quad and HS Vaso medium pressure 10 minutes in  elevation  04/29/22 Bike level 5 x 7 minutes Tmill push and pull 20s x 3 each Feet on ball K2C, bridges and isometric abs Leg press 60# both legs x 10, then single legs 30# 2x10 each On airex ball toss, eyes closed , head turns Airex balance beam side stepping and tandem walk Passive ITB and knee extension Calve stretch Green tband clamshells  04/23/22 Bike level 5 x 7 minutes Tmill off push and pull 20 seconds x  3 each LEg press 60# 2x10, then left only 40# with green tband behind knee for TKE 10# knee extension 3x10 focus on TKE 25# leg curls 3x10 Hip extension 10# 2x10 Passive stretch, HS, ITB and quads  PATIENT EDUCATION:  Education details: POC Person educated: Patient Education method: Explanation Education comprehension: verbalized understanding   HOME EXERCISE PROGRAM: Access Code: [email or web address] URL: [email or web address] Date: 04/22/2022 Prepared by: Lum Babe  Exercises - Supine ITB Stretch with Strap  - 2 x daily - 7 x weekly - 1 sets - 5 reps - 30 hold - Standing ITB Stretch  - 2 x daily - 7 x weekly - 1 sets - 5 reps - 30 holdLAQ, SAQ, knee flexion seated- given from [place]:  CLINICAL IMPRESSION: Patient did well with progression of exercises, did better with the lateral motions with volleyball, was able to balance on the BOSU much better and without much difficulty today, no increased pain  REHAB POTENTIAL: Good  CLINICAL DECISION MAKING: Stable/uncomplicated  EVALUATION COMPLEXITY: Low   GOALS: Goals reviewed with patient? No  SHORT TERM GOALS: Target date: 03/12/22  Patient will be independent with initial HEP. Goal status:met   LONG TERM GOALS: Target date: 04/23/22  Patient will be independent with advanced/ongoing HEP to improve outcomes and carryover.  Goal status: progressing  2.  Patient will report at least 75% improvement in R knee pain to improve QOL. Baseline: 3/10 pain Goal status: partially  met  3.  Patient will demonstrate improved R knee AROM to >/= 5-120 deg to allow for normal gait and stair mechanics. Baseline: 15-0-88 Goal status: met at 5-120 degrees flexion  4.  Patient will demonstrate improved functional RLE strength as demonstrated by 5/5. Goal status: progressing  5.  Patient will be able to ambulate 600' with normal gait pattern without increased pain to access community.  Baseline: walking with RW Goal status: met  6. Patient will be able to ascend/descend stairs with 1 HR and reciprocal step pattern safely to access home and community.  Baseline: using cane to navigate stairs Goal status:  met  7.  Patient will report 45 on FOTO to demonstrate improved functional ability. Baseline: 35 at eval, 54 03/05/22 Goal status: met  PLAN: PT FREQUENCY: 2x/week  PT DURATION: 12 weeks  PLANNED INTERVENTIONS: Therapeutic exercises, Therapeutic activity, Neuromuscular re-education, Balance training, Gait training, Patient/Family education, Self Care, Joint mobilization, Stair training, Electrical stimulation, Cryotherapy, Taping, Vasopneumatic device, Ionotophoresis '4mg'$ /ml Dexamethasone, and Manual therapy  PLAN FOR NEXT SESSION: Continue to add and return to gym Shandiin Eisenbeis W, PT 05/21/2022, 1:19 PM

## 2022-05-26 ENCOUNTER — Ambulatory Visit: Payer: Medicare Other | Admitting: Physical Therapy

## 2022-05-26 ENCOUNTER — Encounter: Payer: Self-pay | Admitting: Physical Therapy

## 2022-05-26 DIAGNOSIS — R600 Localized edema: Secondary | ICD-10-CM

## 2022-05-26 DIAGNOSIS — R262 Difficulty in walking, not elsewhere classified: Secondary | ICD-10-CM

## 2022-05-26 DIAGNOSIS — M25561 Pain in right knee: Secondary | ICD-10-CM | POA: Diagnosis not present

## 2022-05-26 DIAGNOSIS — M25661 Stiffness of right knee, not elsewhere classified: Secondary | ICD-10-CM

## 2022-05-26 NOTE — Therapy (Signed)
OUTPATIENT PHYSICAL THERAPY LOWER EXTREMITY TREATMENT    Patient Name: Arthur Moore MRN: [id number] DOB:1951/04/20, 72 y.o., male Today's Date: 05/26/2022   PT End of Session - 05/26/22 1116     Visit Number 26    Date for PT Re-Evaluation 05/31/22    Authorization Type Medicare    PT Start Time 1105    PT Stop Time 1150    PT Time Calculation (min) 45 min    Activity Tolerance Patient tolerated treatment well    Behavior During Therapy [facility] for tasks assessed/performed                Past Medical History:  Diagnosis Date   Aortic insufficiency    Arthritis    Asthma    Carotid bruit 07/09/2021   Cervical disc disease    Depression    Diastolic dysfunction 12/09/4816   Hyperlipidemia 08/24/2015   Hypertension    Lumbar disc disease    Psoriasis    Past Surgical History:  Procedure Laterality Date   CHOLECYSTECTOMY  2012   HERNIA REPAIR     LEFT HEART CATH AND CORONARY ANGIOGRAPHY N/A 07/08/2018   Procedure: LEFT HEART CATH AND CORONARY ANGIOGRAPHY;  Surgeon: Burnell Blanks, MD;  Location: [place] [facility];  Service: Cardiovascular;  Laterality: N/A;   PAROTIDECTOMY Right 1992   PROSTATE SURGERY  2002, 2004   [place]     SINUS EXPLORATION  2013   SPINE SURGERY  2013   L C7-T1 laminectomy and discectomy   TONSILLECTOMY     Patient Active Problem List   Diagnosis Date Noted   Snoring 02/14/2022   Mild sleep apnea 02/12/2022   Carotid bruit 07/09/2021   Aneurysm of ascending aorta ([place]) 06/28/2020   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   CAD (coronary artery disease) 07/09/2018   CKD (chronic kidney disease), 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   Chest pain of uncertain etiology 56/31/4970   Chronic cough 06/21/2018   Thrombocytopenia ([place]) 12/01/2016   Transaminitis 26/37/8588   Diastolic dysfunction 50/27/7412   MDD (major depressive disorder), recurrent severe, without psychosis ([place]) 10/13/2015   Hyperlipidemia 08/24/2015    Arthritis    Depression    Hypertension    Aortic insufficiency    Psoriasis    Cervical disc disease    Lumbar disc disease    Asthma 01/06/2015   Psoriatic arthritis ([place]) 01/06/2015   Allergic rhinoconjunctivitis 01/06/2015   GERD (gastroesophageal reflux disease) 01/06/2015   Laryngopharyngeal reflux (LPR) 01/06/2015    PCP: No PCP  REFERRING PROVIDER: Victorino December  REFERRING DIAG: Z47.89   THERAPY DIAG:  Acute pain of right knee  Localized edema  Difficulty in walking, not elsewhere classified  Stiffness of right knee, not elsewhere classified  Rationale for Evaluation and Treatment Rehabilitation  ONSET DATE: 12/26/21  SUBJECTIVE:   SUBJECTIVE STATEMENT: Patient reports only posterior lateral knee pain PERTINENT HISTORY: R meniscus repait post op 7 weeks  PAIN:  Are you having pain? Yes right knee 2-3/10, "Its just there"  PRECAUTIONS: None  WEIGHT BEARING RESTRICTIONS No  FALLS:  Has patient fallen in last 6 months? No  LIVING ENVIRONMENT: Lives with: lives with their spouse Lives in: House/apartment Stairs: Yes: Internal: 12 steps; on right going up Has following equipment at home: Single point cane and Walker - 2 wheeled  OCCUPATION: Retired  PLOF: Independent  PATIENT GOALS go out and play pickleball    OBJECTIVE:   PATIENT SURVEYS:  FOTO 35/100  COGNITION:  Overall  cognitive status: Within functional limits for tasks assessed     SENSATION: WFL   MUSCLE LENGTH: Hamstrings: Right tightness limiting ROM   POSTURE: No Significant postural limitations and rounded shoulders  PALPATION: No TTP, some swelling in R knee   LOWER EXTREMITY ROM:  Active ROM Right eval 02/07/22 03/03/22 03/10/22 03/31/22 04/22/22  Hip flexion        Hip extension        Hip abduction        Hip adduction        Hip internal rotation        Hip external rotation        Knee flexion 88 105 120 120 120 122  Knee extension -15 -'10 7 5 10 6   '$ Ankle dorsiflexion        Ankle plantarflexion        Ankle inversion        Ankle eversion         (Blank rows = not tested)  LOWER EXTREMITY MMT:  MMT Right eval Right 04/30/22  Hip flexion 4   Hip extension    Hip abduction    Hip adduction    Hip internal rotation    Hip external rotation    Knee flexion 4 5-  Knee extension 3+ w/pain 4+  Ankle dorsiflexion    Ankle plantarflexion    Ankle inversion    Ankle eversion     (Blank rows = not tested)  FUNCTIONAL TESTS:  5 times sit to stand: 18.89s Timed up and go (TUG): 17.53s  02/24/22 12 seconds   04/22/22 8 seconds  GAIT: Distance walked: 27f Assistive device utilized: WEnvironmental consultant- 2 wheeled and able to do TUG without AD  Level of assistance: Modified independence Comments: decreased stance time on RLE, decreased step length, antalgic gait, decreased R knee flexion,     TODAY'S TREATMENT: 05/26/22 Walk outside around the building, brisk pace Bike level 5 x 7 minutes 5x power burst Volleyball with movements for lateral motions Tmill off push and pull 30s x 3 each Education on HR, HIIT and other ways for him to push  His aerobic capacity as he had a lot of quesitons LE stretches  05/21/22 Bike level 5 x 7 minutes with 4x power burst Calf stretch Tmill push and pull 20s 4x each Volleyball with some lateral motions Fast pace walk around the back building On bosu balance, reaching, head turns Leg extension 10# right only Leg press right only 20# Leg curls 25# right only 3x10  05/19/22 Bike level 4.5 x 7 minutes Calf stretch Leg curls 35# 3x10, 25# right only 2x10 5# leg extension 3x10 cues for TKE Tmill push and pull 20s x3 each Leg press 60# 3x10, then right only 20# 2x10 Passive LE stretches Volleyball with some lateral movements  05/14/22 Bike LEvel 4 x 7 minutes Leg extension 5# right only 2x10 Leg curls 25# 2x10 Leg press 40# 2x10 then right only 20#  Resisted gait all directions 40# PROM of the  right LE STM to the right HS and the ITB AROM of the right knee 5-127 degrees flexion  04/30/22 Passive stretch HS, ITB, piriformis, Quad, hip flexor  And calves STM with the theragun to the right ITB, quad and HS Vaso medium pressure 10 minutes in elevation  04/29/22 Bike level 5 x 7 minutes Tmill push and pull 20s x 3 each Feet on ball K2C, bridges and isometric abs Leg press 60# both legs  x 10, then single legs 30# 2x10 each On airex ball toss, eyes closed , head turns Airex balance beam side stepping and tandem walk Passive ITB and knee extension Calve stretch Green tband clamshells  04/23/22 Bike level 5 x 7 minutes Tmill off push and pull 20 seconds x 3 each LEg press 60# 2x10, then left only 40# with green tband behind knee for TKE 10# knee extension 3x10 focus on TKE 25# leg curls 3x10 Hip extension 10# 2x10 Passive stretch, HS, ITB and quads  PATIENT EDUCATION:  Education details: POC Person educated: Patient Education method: Explanation Education comprehension: verbalized understanding   HOME EXERCISE PROGRAM: Access Code: [email or web address] URL: [email or web address] Date: 04/22/2022 Prepared by: Lum Babe  Exercises - Supine ITB Stretch with Strap  - 2 x daily - 7 x weekly - 1 sets - 5 reps - 30 hold - Standing ITB Stretch  - 2 x daily - 7 x weekly - 1 sets - 5 reps - 30 holdLAQ, SAQ, knee flexion seated- given from [place]:  CLINICAL IMPRESSION: Patient with a lot of questions regarding exercise and how to increase calorie burn and his cardio tolerance.  He did well but was short of breath, HR up to 115 with the power bursts measured on the pulse ox  REHAB POTENTIAL: Good  CLINICAL DECISION MAKING: Stable/uncomplicated  EVALUATION COMPLEXITY: Low   GOALS: Goals reviewed with patient? No  SHORT TERM GOALS: Target date: 03/12/22  Patient will be independent with initial HEP. Goal status:met   LONG TERM GOALS:  Target date: 04/23/22  Patient will be independent with advanced/ongoing HEP to improve outcomes and carryover.  Goal status: progressing  2.  Patient will report at least 75% improvement in R knee pain to improve QOL. Baseline: 3/10 pain Goal status: partially met  3.  Patient will demonstrate improved R knee AROM to >/= 5-120 deg to allow for normal gait and stair mechanics. Baseline: 15-0-88 Goal status: met at 5-120 degrees flexion  4.  Patient will demonstrate improved functional RLE strength as demonstrated by 5/5. Goal status: progressing  5.  Patient will be able to ambulate 600' with normal gait pattern without increased pain to access community.  Baseline: walking with RW Goal status: met  6. Patient will be able to ascend/descend stairs with 1 HR and reciprocal step pattern safely to access home and community.  Baseline: using cane to navigate stairs Goal status:  met  7.  Patient will report 76 on FOTO to demonstrate improved functional ability. Baseline: 35 at eval, 54 03/05/22 Goal status: met  PLAN: PT FREQUENCY: 2x/week  PT DURATION: 12 weeks  PLANNED INTERVENTIONS: Therapeutic exercises, Therapeutic activity, Neuromuscular re-education, Balance training, Gait training, Patient/Family education, Self Care, Joint mobilization, Stair training, Electrical stimulation, Cryotherapy, Taping, Vasopneumatic device, Ionotophoresis '4mg'$ /ml Dexamethasone, and Manual therapy  PLAN FOR NEXT SESSION: Continue to add and return to gym Darden Flemister W, PT 05/26/2022, 11:17 AM

## 2022-05-28 ENCOUNTER — Encounter: Payer: Self-pay | Admitting: Physical Therapy

## 2022-05-28 ENCOUNTER — Ambulatory Visit: Payer: Medicare Other | Admitting: Physical Therapy

## 2022-05-28 DIAGNOSIS — M25661 Stiffness of right knee, not elsewhere classified: Secondary | ICD-10-CM | POA: Diagnosis not present

## 2022-05-28 DIAGNOSIS — R600 Localized edema: Secondary | ICD-10-CM | POA: Diagnosis not present

## 2022-05-28 DIAGNOSIS — R262 Difficulty in walking, not elsewhere classified: Secondary | ICD-10-CM | POA: Diagnosis not present

## 2022-05-28 DIAGNOSIS — M25561 Pain in right knee: Secondary | ICD-10-CM

## 2022-05-28 NOTE — Therapy (Signed)
OUTPATIENT PHYSICAL THERAPY LOWER EXTREMITY TREATMENT    Patient Name: Arthur Moore MRN: [id number] DOB:[birth year], 72 y.o., male Today's Date: 05/28/2022   PT End of Session - 05/28/22 1745     Visit Number 27    Authorization Type Medicare    PT Start Time 4259    PT Stop Time 5638    PT Time Calculation (min) 50 min    Activity Tolerance Patient tolerated treatment well    Behavior During Therapy [facility] for tasks assessed/performed                Past Medical History:  Diagnosis Date   Aortic insufficiency    Arthritis    Asthma    Carotid bruit 07/09/2021   Cervical disc disease    Depression    Diastolic dysfunction 7/56/4332   Hyperlipidemia 08/24/2015   Hypertension    Lumbar disc disease    Psoriasis    Past Surgical History:  Procedure Laterality Date   CHOLECYSTECTOMY  2012   HERNIA REPAIR     LEFT HEART CATH AND CORONARY ANGIOGRAPHY N/A 07/08/2018   Procedure: LEFT HEART CATH AND CORONARY ANGIOGRAPHY;  Surgeon: Burnell Blanks, MD;  Location: [facility];  Service: Cardiovascular;  Laterality: N/A;   PAROTIDECTOMY Right 1992   PROSTATE SURGERY  2002, 2004   [place]     SINUS EXPLORATION  2013   SPINE SURGERY  2013   L C7-T1 laminectomy and discectomy   TONSILLECTOMY     Patient Active Problem List   Diagnosis Date Noted   Snoring 02/14/2022   Mild sleep apnea 02/12/2022   Carotid bruit 07/09/2021   Aneurysm of ascending aorta ([place]) 06/28/2020   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   CAD (coronary artery disease) 07/09/2018   CKD (chronic kidney disease), 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   Chest pain of uncertain etiology 95/18/8416   Chronic cough 06/21/2018   Thrombocytopenia ([place]) 12/01/2016   Transaminitis 60/63/0160   Diastolic dysfunction 10/93/2355   MDD (major depressive disorder), recurrent severe, without psychosis ([place]) 10/13/2015   Hyperlipidemia 08/24/2015   Arthritis    Depression     Hypertension    Aortic insufficiency    Psoriasis    Cervical disc disease    Lumbar disc disease    Asthma 01/06/2015   Psoriatic arthritis ([place]) 01/06/2015   Allergic rhinoconjunctivitis 01/06/2015   GERD (gastroesophageal reflux disease) 01/06/2015   Laryngopharyngeal reflux (LPR) 01/06/2015    PCP: No PCP  REFERRING PROVIDER: Victorino December  REFERRING DIAG: Z47.89   THERAPY DIAG:  Acute pain of right knee  Localized edema  Difficulty in walking, not elsewhere classified  Stiffness of right knee, not elsewhere classified  Rationale for Evaluation and Treatment Rehabilitation  ONSET DATE: 12/26/21  SUBJECTIVE:   SUBJECTIVE STATEMENT: Still with the distal ITB pain PERTINENT HISTORY: R meniscus repait post op 7 weeks  PAIN:  Are you having pain? Yes right knee 2/10, tight and ache PRECAUTIONS: None  WEIGHT BEARING RESTRICTIONS No  FALLS:  Has patient fallen in last 6 months? No  LIVING ENVIRONMENT: Lives with: lives with their spouse Lives in: House/apartment Stairs: Yes: Internal: 12 steps; on right going up Has following equipment at home: Single point cane and Walker - 2 wheeled  OCCUPATION: Retired  PLOF: [place] go out and play pickleball    OBJECTIVE:   PATIENT SURVEYS:  FOTO 35/100  COGNITION:  Overall cognitive status: Within functional limits for tasks assessed  SENSATION: WFL   MUSCLE LENGTH: Hamstrings: Right tightness limiting ROM   POSTURE: No Significant postural limitations and rounded shoulders  PALPATION: No TTP, some swelling in R knee   LOWER EXTREMITY ROM:  Active ROM Right eval 02/07/22 03/03/22 03/10/22 03/31/22 04/22/22  Hip flexion        Hip extension        Hip abduction        Hip adduction        Hip internal rotation        Hip external rotation        Knee flexion 88 105 120 120 120 122  Knee extension -15 -'10 7 5 10 6  '$ Ankle dorsiflexion        Ankle plantarflexion         Ankle inversion        Ankle eversion         (Blank rows = not tested)  LOWER EXTREMITY MMT:  MMT Right eval Right 04/30/22  Hip flexion 4   Hip extension    Hip abduction    Hip adduction    Hip internal rotation    Hip external rotation    Knee flexion 4 5-  Knee extension 3+ w/pain 4+  Ankle dorsiflexion    Ankle plantarflexion    Ankle inversion    Ankle eversion     (Blank rows = not tested)  FUNCTIONAL TESTS:  5 times sit to stand: 18.89s Timed up and go (TUG): 17.53s  02/24/22 12 seconds   04/22/22 8 seconds  GAIT: Distance walked: 20f Assistive device utilized: WEnvironmental consultant- 2 wheeled and able to do TUG without AD  Level of assistance: Modified independence Comments: decreased stance time on RLE, decreased step length, antalgic gait, decreased R knee flexion,     TODAY'S TREATMENT: 05/28/22 Bike level 5 with 5x power burst x 7 mintues Tmill off push and pull Volleyball Leg extension right only 10# 3x10 Sit to stand with OHP 20# 3x8 Resisted gait in the hall black tband 5# hip extensiona nd abduction 2x10 4" step ups not using toe for assist  05/26/22 Walk outside around the building, brisk pace Bike level 5 x 7 minutes 5x power burst Volleyball with movements for lateral motions Tmill off push and pull 30s x 3 each Education on HR, HIIT and other ways for him to push  His aerobic capacity as he had a lot of quesitons LE stretches  05/21/22 Bike level 5 x 7 minutes with 4x power burst Calf stretch Tmill push and pull 20s 4x each Volleyball with some lateral motions Fast pace walk around the back building On bosu balance, reaching, head turns Leg extension 10# right only Leg press right only 20# Leg curls 25# right only 3x10  05/19/22 Bike level 4.5 x 7 minutes Calf stretch Leg curls 35# 3x10, 25# right only 2x10 5# leg extension 3x10 cues for TKE Tmill push and pull 20s x3 each Leg press 60# 3x10, then right only 20# 2x10 Passive LE  stretches Volleyball with some lateral movements  05/14/22 Bike LEvel 4 x 7 minutes Leg extension 5# right only 2x10 Leg curls 25# 2x10 Leg press 40# 2x10 then right only 20#  Resisted gait all directions 40# PROM of the right LE STM to the right HS and the ITB AROM of the right knee 5-127 degrees flexion  04/30/22 Passive stretch HS, ITB, piriformis, Quad, hip flexor  And calves STM with the theragun to the right  ITB, quad and HS Vaso medium pressure 10 minutes in elevation  PATIENT EDUCATION:  Education details: POC Person educated: Patient Education method: Explanation Education comprehension: verbalized understanding   HOME EXERCISE PROGRAM: Access Code: [email or web address] URL: [email or web address] Date: 04/22/2022 Prepared by: Lum Babe  Exercises - Supine ITB Stretch with Strap  - 2 x daily - 7 x weekly - 1 sets - 5 reps - 30 hold - Standing ITB Stretch  - 2 x daily - 7 x weekly - 1 sets - 5 reps - 30 holdLAQ, SAQ, knee flexion seated- given from [place]:  CLINICAL IMPRESSION: I tried to work on his aerobic capacity and multijoint activities, he was fatigued and short of breath but overall did well, added hips and he is weak with this  REHAB POTENTIAL: Good  CLINICAL DECISION MAKING: Stable/uncomplicated  EVALUATION COMPLEXITY: Low   GOALS: Goals reviewed with patient? No  SHORT TERM GOALS: Target date: 03/12/22  Patient will be independent with initial HEP. Goal status:met   LONG TERM GOALS: Target date: 04/23/22  Patient will be independent with advanced/ongoing HEP to improve outcomes and carryover.  Goal status: progressing  2.  Patient will report at least 75% improvement in R knee pain to improve QOL. Baseline: 3/10 pain Goal status: partially met  3.  Patient will demonstrate improved R knee AROM to >/= 5-120 deg to allow for normal gait and stair mechanics. Baseline: 15-0-88 Goal status: met at 5-120 degrees  flexion  4.  Patient will demonstrate improved functional RLE strength as demonstrated by 5/5. Goal status: progressing  5.  Patient will be able to ambulate 600' with normal gait pattern without increased pain to access community.  Baseline: walking with RW Goal status: met  6. Patient will be able to ascend/descend stairs with 1 HR and reciprocal step pattern safely to access home and community.  Baseline: using cane to navigate stairs Goal status:  met  7.  Patient will report 64 on FOTO to demonstrate improved functional ability. Baseline: 35 at eval, 54 03/05/22 Goal status: met  PLAN: PT FREQUENCY: 2x/week  PT DURATION: 12 weeks  PLANNED INTERVENTIONS: Therapeutic exercises, Therapeutic activity, Neuromuscular re-education, Balance training, Gait training, Patient/Family education, Self Care, Joint mobilization, Stair training, Electrical stimulation, Cryotherapy, Taping, Vasopneumatic device, Ionotophoresis '4mg'$ /ml Dexamethasone, and Manual therapy  PLAN FOR NEXT SESSION: Continue to add and return to gym Sumner Boast, PT 05/28/2022, 5:48 PM

## 2022-06-03 ENCOUNTER — Ambulatory Visit: Payer: Medicare Other | Attending: Orthopedic Surgery | Admitting: Physical Therapy

## 2022-06-03 ENCOUNTER — Encounter: Payer: Self-pay | Admitting: Physical Therapy

## 2022-06-03 DIAGNOSIS — M25561 Pain in right knee: Secondary | ICD-10-CM | POA: Insufficient documentation

## 2022-06-03 DIAGNOSIS — R262 Difficulty in walking, not elsewhere classified: Secondary | ICD-10-CM | POA: Insufficient documentation

## 2022-06-03 DIAGNOSIS — M25661 Stiffness of right knee, not elsewhere classified: Secondary | ICD-10-CM | POA: Insufficient documentation

## 2022-06-03 DIAGNOSIS — R600 Localized edema: Secondary | ICD-10-CM | POA: Diagnosis not present

## 2022-06-03 NOTE — Therapy (Signed)
OUTPATIENT PHYSICAL THERAPY LOWER EXTREMITY TREATMENT    Patient Name: Arthur Moore MRN: [id number] DOB:[birth year], 72 y.o., male Today's Date: 06/03/2022   PT End of Session - 06/03/22 1539     Visit Number 28    Date for PT Re-Evaluation 07/02/22    Authorization Type Medicare    PT Start Time 1530    PT Stop Time 1615    PT Time Calculation (min) 45 min    Activity Tolerance Patient tolerated treatment well    Behavior During Therapy [facility] for tasks assessed/performed                Past Medical History:  Diagnosis Date   Aortic insufficiency    Arthritis    Asthma    Carotid bruit 07/09/2021   Cervical disc disease    Depression    Diastolic dysfunction 5/59/7416   Hyperlipidemia 08/24/2015   Hypertension    Lumbar disc disease    Psoriasis    Past Surgical History:  Procedure Laterality Date   CHOLECYSTECTOMY  2012   HERNIA REPAIR     LEFT HEART CATH AND CORONARY ANGIOGRAPHY N/A 07/08/2018   Procedure: LEFT HEART CATH AND CORONARY ANGIOGRAPHY;  Surgeon: Burnell Blanks, MD;  Location: [place] [facility];  Service: Cardiovascular;  Laterality: N/A;   PAROTIDECTOMY Right 1992   PROSTATE SURGERY  2002, 2004   [place]     SINUS EXPLORATION  2013   SPINE SURGERY  2013   L C7-T1 laminectomy and discectomy   TONSILLECTOMY     Patient Active Problem List   Diagnosis Date Noted   Snoring 02/14/2022   Mild sleep apnea 02/12/2022   Carotid bruit 07/09/2021   Aneurysm of ascending aorta ([place]) 06/28/2020   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   CAD (coronary artery disease) 07/09/2018   CKD (chronic kidney disease), 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   Chest pain of uncertain etiology 38/45/3646   Chronic cough 06/21/2018   Thrombocytopenia ([place]) 12/01/2016   Transaminitis 80/32/1224   Diastolic dysfunction 82/50/0370   MDD (major depressive disorder), recurrent severe, without psychosis ([place]) 10/13/2015   Hyperlipidemia 08/24/2015    Arthritis    Depression    Hypertension    Aortic insufficiency    Psoriasis    Cervical disc disease    Lumbar disc disease    Asthma 01/06/2015   Psoriatic arthritis ([place]) 01/06/2015   Allergic rhinoconjunctivitis 01/06/2015   GERD (gastroesophageal reflux disease) 01/06/2015   Laryngopharyngeal reflux (LPR) 01/06/2015    PCP: No PCP  REFERRING PROVIDER: Victorino December  REFERRING DIAG: Z47.89   THERAPY DIAG:  Acute pain of right knee  Localized edema  Difficulty in walking, not elsewhere classified  Stiffness of right knee, not elsewhere classified  Rationale for Evaluation and Treatment Rehabilitation  ONSET DATE: 12/26/21  SUBJECTIVE:   SUBJECTIVE STATEMENT: Patient reports that he really over did it on Sunday, rode the bike and got his hear rate up too high and it took hours to come down.  He also played pickleball 3 games and reports he could not move well and he had swelling and hurting PERTINENT HISTORY: R meniscus repait post op 7 weeks  PAIN:  Are you having pain? Yes right knee 2/10, tight and ache PRECAUTIONS: None  WEIGHT BEARING RESTRICTIONS No  FALLS:  Has patient fallen in last 6 months? No  LIVING ENVIRONMENT: Lives with: lives with their spouse Lives in: House/apartment Stairs: Yes: Internal: 12 steps; on right going up Has following  equipment at home: Single point cane and Walker - 2 wheeled  OCCUPATION: Retired  PLOF: Independent  PATIENT GOALS go out and play pickleball    OBJECTIVE:   PATIENT SURVEYS:  [place]:  Overall cognitive status: Within functional limits for tasks assessed     SENSATION: WFL   MUSCLE LENGTH: Hamstrings: Right tightness limiting ROM   POSTURE: No Significant postural limitations and rounded shoulders  PALPATION: No TTP, some swelling in R knee   LOWER EXTREMITY ROM:  Active ROM Right eval 02/07/22 03/03/22 03/10/22 03/31/22 04/22/22  Hip flexion        Hip extension         Hip abduction        Hip adduction        Hip internal rotation        Hip external rotation        Knee flexion 88 105 120 120 120 122  Knee extension -15 -'10 7 5 10 6  '$ Ankle dorsiflexion        Ankle plantarflexion        Ankle inversion        Ankle eversion         (Blank rows = not tested)  LOWER EXTREMITY MMT:  MMT Right eval Right 04/30/22  Hip flexion 4   Hip extension    Hip abduction    Hip adduction    Hip internal rotation    Hip external rotation    Knee flexion 4 5-  Knee extension 3+ w/pain 4+  Ankle dorsiflexion    Ankle plantarflexion    Ankle inversion    Ankle eversion     (Blank rows = not tested)  FUNCTIONAL TESTS:  5 times sit to stand: 18.89s Timed up and go (TUG): 17.53s  02/24/22 12 seconds   04/22/22 8 seconds  GAIT: Distance walked: 85f Assistive device utilized: WEnvironmental consultant- 2 wheeled and able to do TUG without AD  Level of assistance: Modified independence Comments: decreased stance time on RLE, decreased step length, antalgic gait, decreased R knee flexion,     TODAY'S TREATMENT: 06/03/22 Bike level 5 x 6 minutes Fast walk around the back building Tried lateal shuffling Back pedal fast forward 3 steps Volley ball LE stretches Resisted gait all directions  05/28/22 Bike level 5 with 5x power burst x 7 mintues Tmill off push and pull Volleyball Leg extension right only 10# 3x10 Sit to stand with OHP 20# 3x8 Resisted gait in the hall black tband 5# hip extensiona nd abduction 2x10 4" step ups not using toe for assist  05/26/22 Walk outside around the building, brisk pace Bike level 5 x 7 minutes 5x power burst Volleyball with movements for lateral motions Tmill off push and pull 30s x 3 each Education on HR, HIIT and other ways for him to push  His aerobic capacity as he had a lot of quesitons LE stretches  05/21/22 Bike level 5 x 7 minutes with 4x power burst Calf stretch Tmill push and pull 20s 4x each Volleyball with  some lateral motions Fast pace walk around the back building On bosu balance, reaching, head turns Leg extension 10# right only Leg press right only 20# Leg curls 25# right only 3x10  05/19/22 Bike level 4.5 x 7 minutes Calf stretch Leg curls 35# 3x10, 25# right only 2x10 5# leg extension 3x10 cues for TKE Tmill push and pull 20s x3 each Leg press 60# 3x10, then right only  20# 2x10 Passive LE stretches Volleyball with some lateral movements  PATIENT EDUCATION:  Education details: POC Person educated: Patient Education method: Explanation Education comprehension: verbalized understanding   HOME EXERCISE PROGRAM: Access Code: [email or web address] URL: [email or web address] Date: 04/22/2022 Prepared by: Lum Babe  Exercises - Supine ITB Stretch with Strap  - 2 x daily - 7 x weekly - 1 sets - 5 reps - 30 hold - Standing ITB Stretch  - 2 x daily - 7 x weekly - 1 sets - 5 reps - 30 holdLAQ, SAQ, knee flexion seated- given from [place]:  CLINICAL IMPRESSION: Patient has been progressing well his ITB has had some tightness and he reports that this has held him back.  He did over do it recently as he tried pickleball again.  He played one easy game and was excited and then played two more competetive games the same day and then really pushed himself on the bike, reports high HR for a while, I warned him of over doing it and told him his HR should return to resting HR after 2 minutes, he reports he was frustrated with his mobility as he had difficulty with lateral motions  REHAB POTENTIAL: Good  CLINICAL DECISION MAKING: Stable/uncomplicated  EVALUATION COMPLEXITY: Low   GOALS: Goals reviewed with patient? No  SHORT TERM GOALS: Target date: 03/12/22  Patient will be independent with initial HEP. Goal status:met   LONG TERM GOALS: Target date: 04/23/22  Patient will be independent with advanced/ongoing HEP to improve outcomes and carryover.  Goal  status: progressing  2.  Patient will report at least 75% improvement in R knee pain to improve QOL. Baseline: 3/10 pain Goal status: partially met  3.  Patient will demonstrate improved R knee AROM to >/= 5-120 deg to allow for normal gait and stair mechanics. Baseline: 15-0-88 Goal status: met at 5-120 degrees flexion  4.  Patient will demonstrate improved functional RLE strength as demonstrated by 5/5. Goal status: progressing  5.  Patient will be able to ambulate 600' with normal gait pattern without increased pain to access community.  Baseline: walking with RW Goal status: met  6. Patient will be able to ascend/descend stairs with 1 HR and reciprocal step pattern safely to access home and community.  Baseline: using cane to navigate stairs Goal status:  met  7.  Patient will report 63 on FOTO to demonstrate improved functional ability. Baseline: 35 at eval, 54 03/05/22 Goal status: met  PLAN: PT FREQUENCY: 2x/week  PT DURATION: 12 weeks  PLANNED INTERVENTIONS: Therapeutic exercises, Therapeutic activity, Neuromuscular re-education, Balance training, Gait training, Patient/Family education, Self Care, Joint mobilization, Stair training, Electrical stimulation, Cryotherapy, Taping, Vasopneumatic device, Ionotophoresis '4mg'$ /ml Dexamethasone, and Manual therapy  PLAN FOR NEXT SESSION: I will try over the next month to really get him back to his norm but I have cautioned him about over doing it Sumner Boast, PT 06/03/2022, 3:40 PM

## 2022-06-05 ENCOUNTER — Ambulatory Visit: Payer: Medicare Other | Admitting: Physical Therapy

## 2022-06-05 ENCOUNTER — Encounter: Payer: Self-pay | Admitting: Physical Therapy

## 2022-06-05 ENCOUNTER — Ambulatory Visit: Payer: Medicare Other | Admitting: Psychiatry

## 2022-06-05 DIAGNOSIS — M25561 Pain in right knee: Secondary | ICD-10-CM

## 2022-06-05 DIAGNOSIS — R262 Difficulty in walking, not elsewhere classified: Secondary | ICD-10-CM

## 2022-06-05 DIAGNOSIS — R600 Localized edema: Secondary | ICD-10-CM | POA: Diagnosis not present

## 2022-06-05 DIAGNOSIS — M25661 Stiffness of right knee, not elsewhere classified: Secondary | ICD-10-CM | POA: Diagnosis not present

## 2022-06-05 NOTE — Therapy (Signed)
OUTPATIENT PHYSICAL THERAPY LOWER EXTREMITY TREATMENT    Patient Name: Arthur Moore MRN: [id number] DOB:08/31/1950, 72 y.o., male Today's Date: 06/05/2022   PT End of Session - 06/05/22 1536     Visit Number 29    Date for PT Re-Evaluation 07/02/22    Authorization Type Medicare    PT Start Time 31    PT Stop Time 1615    PT Time Calculation (min) 47 min    Activity Tolerance Patient tolerated treatment well    Behavior During Therapy [facility] for tasks assessed/performed                Past Medical History:  Diagnosis Date   Aortic insufficiency    Arthritis    Asthma    Carotid bruit 07/09/2021   Cervical disc disease    Depression    Diastolic dysfunction 12/20/537   Hyperlipidemia 08/24/2015   Hypertension    Lumbar disc disease    Psoriasis    Past Surgical History:  Procedure Laterality Date   CHOLECYSTECTOMY  2012   HERNIA REPAIR     LEFT HEART CATH AND CORONARY ANGIOGRAPHY N/A 07/08/2018   Procedure: LEFT HEART CATH AND CORONARY ANGIOGRAPHY;  Surgeon: Burnell Blanks, MD;  Location: [place] [facility];  Service: Cardiovascular;  Laterality: N/A;   PAROTIDECTOMY Right 1992   PROSTATE SURGERY  2002, 2004   [place]     SINUS EXPLORATION  2013   SPINE SURGERY  2013   L C7-T1 laminectomy and discectomy   TONSILLECTOMY     Patient Active Problem List   Diagnosis Date Noted   Snoring 02/14/2022   Mild sleep apnea 02/12/2022   Carotid bruit 07/09/2021   Aneurysm of ascending aorta ([place]) 06/28/2020   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   CAD (coronary artery disease) 07/09/2018   CKD (chronic kidney disease), 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   Chest pain of uncertain etiology 76/73/4193   Chronic cough 06/21/2018   Thrombocytopenia ([place]) 12/01/2016   Transaminitis 79/05/4095   Diastolic dysfunction 35/32/9924   MDD (major depressive disorder), recurrent severe, without psychosis ([place]) 10/13/2015   Hyperlipidemia 08/24/2015    Arthritis    Depression    Hypertension    Aortic insufficiency    Psoriasis    Cervical disc disease    Lumbar disc disease    Asthma 01/06/2015   Psoriatic arthritis ([place]) 01/06/2015   Allergic rhinoconjunctivitis 01/06/2015   GERD (gastroesophageal reflux disease) 01/06/2015   Laryngopharyngeal reflux (LPR) 01/06/2015    PCP: No PCP  REFERRING PROVIDER: Victorino December  REFERRING DIAG: Z47.89   THERAPY DIAG:  Acute pain of right knee  Localized edema  Difficulty in walking, not elsewhere classified  Stiffness of right knee, not elsewhere classified  Rationale for Evaluation and Treatment Rehabilitation  ONSET DATE: 12/26/21  SUBJECTIVE:   SUBJECTIVE STATEMENT: PAtient reports frustration with the right ITB and maybe in the right HS insertion PERTINENT HISTORY: R meniscus repait post op 7 weeks  PAIN:  Are you having pain? Yes right knee 2/10, tight and ache PRECAUTIONS: None  WEIGHT BEARING RESTRICTIONS No  FALLS:  Has patient fallen in last 6 months? No  LIVING ENVIRONMENT: Lives with: lives with their spouse Lives in: House/apartment Stairs: Yes: Internal: 12 steps; on right going up Has following equipment at home: Single point cane and Walker - 2 wheeled  OCCUPATION: Retired  PLOF: Independent  PATIENT GOALS go out and play pickleball    OBJECTIVE:   PATIENT SURVEYS:  FOTO 35/100  COGNITION:  Overall cognitive status: Within functional limits for tasks assessed     SENSATION: WFL   MUSCLE LENGTH: Hamstrings: Right tightness limiting ROM   POSTURE: No Significant postural limitations and rounded shoulders  PALPATION: No TTP, some swelling in R knee   LOWER EXTREMITY ROM:  Active ROM Right eval 02/07/22 03/03/22 03/10/22 03/31/22 04/22/22  Hip flexion        Hip extension        Hip abduction        Hip adduction        Hip internal rotation        Hip external rotation        Knee flexion 88 105 120 120 120 122  Knee  extension -15 -'10 7 5 10 6  '$ Ankle dorsiflexion        Ankle plantarflexion        Ankle inversion        Ankle eversion         (Blank rows = not tested)  LOWER EXTREMITY MMT:  MMT Right eval Right 04/30/22  Hip flexion 4   Hip extension    Hip abduction    Hip adduction    Hip internal rotation    Hip external rotation    Knee flexion 4 5-  Knee extension 3+ w/pain 4+  Ankle dorsiflexion    Ankle plantarflexion    Ankle inversion    Ankle eversion     (Blank rows = not tested)  FUNCTIONAL TESTS:  5 times sit to stand: 18.89s Timed up and go (TUG): 17.53s  02/24/22 12 seconds   04/22/22 8 seconds  GAIT: Distance walked: 67f Assistive device utilized: WEnvironmental consultant- 2 wheeled and able to do TUG without AD  Level of assistance: Modified independence Comments: decreased stance time on RLE, decreased step length, antalgic gait, decreased R knee flexion,     TODAY'S TREATMENT: 06/05/22 Fast walk around the building  Bike level 5 x 7 minutes 3 power bursts Volleyball working on lateral motions Side shuffling, direction changes quickly Passive stretch HS, ITB and piriformis STM to the right HS and ITB  06/03/22 Bike level 5 x 6 minutes Fast walk around the back building Tried lateal shuffling Back pedal fast forward 3 steps Volley ball LE stretches Resisted gait all directions  05/28/22 Bike level 5 with 5x power burst x 7 mintues Tmill off push and pull Volleyball Leg extension right only 10# 3x10 Sit to stand with OHP 20# 3x8 Resisted gait in the hall black tband 5# hip extensiona nd abduction 2x10 4" step ups not using toe for assist  05/26/22 Walk outside around the building, brisk pace Bike level 5 x 7 minutes 5x power burst Volleyball with movements for lateral motions Tmill off push and pull 30s x 3 each Education on HR, HIIT and other ways for him to push  His aerobic capacity as he had a lot of quesitons LE stretches  05/21/22 Bike level 5 x 7 minutes  with 4x power burst Calf stretch Tmill push and pull 20s 4x each Volleyball with some lateral motions Fast pace walk around the back building On bosu balance, reaching, head turns Leg extension 10# right only Leg press right only 20# Leg curls 25# right only 3x10  PATIENT EDUCATION:  Education details: POC Person educated: Patient Education method: Explanation Education comprehension: verbalized understanding   HOME EXERCISE PROGRAM: Access Code: [email or web address] URL: [email or web address] Date: 04/22/2022 Prepared by: MLum Babe  Exercises - Supine ITB Stretch with Strap  - 2 x daily - 7 x weekly - 1 sets - 5 reps - 30 hold - Standing ITB Stretch  - 2 x daily - 7 x weekly - 1 sets - 5 reps - 30 holdLAQ, SAQ, knee flexion seated- given from [place]:  CLINICAL IMPRESSION: Patient continues to have right ITB and HS pain and tightness, I am pushing him and his activities.  He really is doing well but is frustrated with the pain and tightness  REHAB POTENTIAL: Good  CLINICAL DECISION MAKING: Stable/uncomplicated  EVALUATION COMPLEXITY: Low   GOALS: Goals reviewed with patient? No  SHORT TERM GOALS: Target date: 03/12/22  Patient will be independent with initial HEP. Goal status:met   LONG TERM GOALS: Target date: 04/23/22  Patient will be independent with advanced/ongoing HEP to improve outcomes and carryover.  Goal status: progressing  2.  Patient will report at least 75% improvement in R knee pain to improve QOL. Baseline: 3/10 pain Goal status: partially met  3.  Patient will demonstrate improved R knee AROM to >/= 5-120 deg to allow for normal gait and stair mechanics. Baseline: 15-0-88 Goal status: met at 5-120 degrees flexion  4.  Patient will demonstrate improved functional RLE strength as demonstrated by 5/5. Goal status: progressing  5.  Patient will be able to ambulate 600' with normal gait pattern without increased  pain to access community.  Baseline: walking with RW Goal status: met  6. Patient will be able to ascend/descend stairs with 1 HR and reciprocal step pattern safely to access home and community.  Baseline: using cane to navigate stairs Goal status:  met  7.  Patient will report 42 on FOTO to demonstrate improved functional ability. Baseline: 35 at eval, 54 03/05/22 Goal status: met  PLAN: PT FREQUENCY: 2x/week  PT DURATION: 12 weeks  PLANNED INTERVENTIONS: Therapeutic exercises, Therapeutic activity, Neuromuscular re-education, Balance training, Gait training, Patient/Family education, Self Care, Joint mobilization, Stair training, Electrical stimulation, Cryotherapy, Taping, Vasopneumatic device, Ionotophoresis '4mg'$ /ml Dexamethasone, and Manual therapy  PLAN FOR NEXT SESSION: I will try over the next month to really get him back to his norm but I have cautioned him about over doing it Sumner Boast, PT 06/05/2022, 3:36 PM

## 2022-06-09 ENCOUNTER — Encounter: Payer: Self-pay | Admitting: Physical Therapy

## 2022-06-09 ENCOUNTER — Ambulatory Visit: Payer: Medicare Other | Admitting: Physical Therapy

## 2022-06-09 DIAGNOSIS — R262 Difficulty in walking, not elsewhere classified: Secondary | ICD-10-CM

## 2022-06-09 DIAGNOSIS — R600 Localized edema: Secondary | ICD-10-CM

## 2022-06-09 DIAGNOSIS — M25561 Pain in right knee: Secondary | ICD-10-CM

## 2022-06-09 DIAGNOSIS — M25661 Stiffness of right knee, not elsewhere classified: Secondary | ICD-10-CM | POA: Diagnosis not present

## 2022-06-09 NOTE — Therapy (Signed)
OUTPATIENT PHYSICAL THERAPY LOWER EXTREMITY TREATMENT Progress Note Reporting Period 04/29/22 to 06/09/22 for visits 21-30  See note below for Objective Data and Assessment of Progress/Goals.       Patient Name: Arthur Moore MRN: [id number] DOB:March 18, 1951, 72 y.o., male Today's Date: 06/09/2022   PT End of Session - 06/09/22 1532     Visit Number 30    Date for PT Re-Evaluation 07/02/22    Authorization Type Medicare    Progress Note Due on Visit 10    PT Start Time 1529    PT Stop Time [id number]    PT Time Calculation (min) 46 min    Activity Tolerance Patient tolerated treatment well    Behavior During Therapy [facility] for tasks assessed/performed                Past Medical History:  Diagnosis Date   Aortic insufficiency    Arthritis    Asthma    Carotid bruit 07/09/2021   Cervical disc disease    Depression    Diastolic dysfunction A999333   Hyperlipidemia 08/24/2015   Hypertension    Lumbar disc disease    Psoriasis    Past Surgical History:  Procedure Laterality Date   CHOLECYSTECTOMY  2012   HERNIA REPAIR     LEFT HEART CATH AND CORONARY ANGIOGRAPHY N/A 07/08/2018   Procedure: LEFT HEART CATH AND CORONARY ANGIOGRAPHY;  Surgeon: Burnell Blanks, MD;  Location: [place] [facility];  Service: Cardiovascular;  Laterality: N/A;   PAROTIDECTOMY Right 1992   PROSTATE SURGERY  2002, 2004   [place]     SINUS EXPLORATION  2013   SPINE SURGERY  2013   L C7-T1 laminectomy and discectomy   TONSILLECTOMY     Patient Active Problem List   Diagnosis Date Noted   Snoring 02/14/2022   Mild sleep apnea 02/12/2022   Carotid bruit 07/09/2021   Aneurysm of ascending aorta ([place]) 06/28/2020   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   CAD (coronary artery disease) 07/09/2018   CKD (chronic kidney disease), 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   Chest pain of uncertain etiology A999333   Chronic cough 06/21/2018   Thrombocytopenia ([place]) 12/01/2016    Transaminitis A999333   Diastolic dysfunction 0000000   MDD (major depressive disorder), recurrent severe, without psychosis ([place]) 10/13/2015   Hyperlipidemia 08/24/2015   Arthritis    Depression    Hypertension    Aortic insufficiency    Psoriasis    Cervical disc disease    Lumbar disc disease    Asthma 01/06/2015   Psoriatic arthritis ([place]) 01/06/2015   Allergic rhinoconjunctivitis 01/06/2015   GERD (gastroesophageal reflux disease) 01/06/2015   Laryngopharyngeal reflux (LPR) 01/06/2015    PCP: No PCP  REFERRING PROVIDER: Victorino December  REFERRING DIAG: Z47.89   THERAPY DIAG:  Acute pain of right knee  Localized edema  Stiffness of right knee, not elsewhere classified  Difficulty in walking, not elsewhere classified  Rationale for Evaluation and Treatment Rehabilitation  ONSET DATE: 12/26/21  SUBJECTIVE:   SUBJECTIVE STATEMENT: I am feeling a little better PERTINENT HISTORY: R meniscus repait post op 7 weeks  PAIN:  Are you having pain? Yes right knee 1/10, tight and ache PRECAUTIONS: None  WEIGHT BEARING RESTRICTIONS No  FALLS:  Has patient fallen in last 6 months? No  LIVING ENVIRONMENT: Lives with: lives with their spouse Lives in: House/apartment Stairs: Yes: Internal: 12 steps; on right going up Has following equipment at home: Single point cane and Walker -  2 wheeled  OCCUPATION: Retired  PLOF: Independent  PATIENT GOALS go out and play pickleball    OBJECTIVE:   PATIENT SURVEYS:  [place]:  Overall cognitive status: Within functional limits for tasks assessed     SENSATION: WFL   MUSCLE LENGTH: Hamstrings: Right tightness limiting ROM   POSTURE: No Significant postural limitations and rounded shoulders  PALPATION: No TTP, some swelling in R knee   LOWER EXTREMITY ROM:  Active ROM Right eval 02/07/22 03/03/22 03/10/22 03/31/22 04/22/22  Hip flexion        Hip extension        Hip abduction        Hip  adduction        Hip internal rotation        Hip external rotation        Knee flexion 88 105 120 120 120 122  Knee extension -15 -10 7 5 10 6  $ Ankle dorsiflexion        Ankle plantarflexion        Ankle inversion        Ankle eversion         (Blank rows = not tested)  LOWER EXTREMITY MMT:  MMT Right eval Right 04/30/22  Hip flexion 4   Hip extension    Hip abduction    Hip adduction    Hip internal rotation    Hip external rotation    Knee flexion 4 5-  Knee extension 3+ w/pain 4+  Ankle dorsiflexion    Ankle plantarflexion    Ankle inversion    Ankle eversion     (Blank rows = not tested)  FUNCTIONAL TESTS:  5 times sit to stand: 18.89s Timed up and go (TUG): 17.53s  02/24/22 12 seconds   04/22/22 8 seconds  GAIT: Distance walked: 30f Assistive device utilized: WEnvironmental consultant- 2 wheeled and able to do TUG without AD  Level of assistance: Modified independence Comments: decreased stance time on RLE, decreased step length, antalgic gait, decreased R knee flexion,     TODAY'S TREATMENT: 06/09/22 Bike level 5 x 6 minutes power burst x 4 Volleyball Tmill push and pull 20s x 3 each LEg press 40# 2x10, then right only 2x10 Bridge with clamshell green tband STM to the right ITB and HS Ktape to the distal ITB  06/05/22 Fast walk around the building  Bike level 5 x 7 minutes 3 power bursts Volleyball working on lateral motions Side shuffling, direction changes quickly Passive stretch HS, ITB and piriformis STM to the right HS and ITB  06/03/22 Bike level 5 x 6 minutes Fast walk around the back building Tried lateal shuffling Back pedal fast forward 3 steps Volley ball LE stretches Resisted gait all directions  05/28/22 Bike level 5 with 5x power burst x 7 mintues Tmill off push and pull Volleyball Leg extension right only 10# 3x10 Sit to stand with OHP 20# 3x8 Resisted gait in the hall black tband 5# hip extensiona nd abduction 2x10 4" step ups not using toe  for assist  05/26/22 Walk outside around the building, brisk pace Bike level 5 x 7 minutes 5x power burst Volleyball with movements for lateral motions Tmill off push and pull 30s x 3 each Education on HR, HIIT and other ways for him to push  His aerobic capacity as he had a lot of quesitons LE stretches  05/21/22 Bike level 5 x 7 minutes with 4x power burst Calf stretch Tmill push and  pull 20s 4x each Volleyball with some lateral motions Fast pace walk around the back building On bosu balance, reaching, head turns Leg extension 10# right only Leg press right only 20# Leg curls 25# right only 3x10  PATIENT EDUCATION:  Education details: POC Person educated: Patient Education method: Explanation Education comprehension: verbalized understanding   HOME EXERCISE PROGRAM: Access Code: [email or web address] URL: [email or web address] Date: 04/22/2022 Prepared by: Lum Babe  Exercises - Supine ITB Stretch with Strap  - 2 x daily - 7 x weekly - 1 sets - 5 reps - 30 hold - Standing ITB Stretch  - 2 x daily - 7 x weekly - 1 sets - 5 reps - 30 holdLAQ, SAQ, knee flexion seated- given from [place]:  CLINICAL IMPRESSION: Patient continues to have right ITB and HS pain and tightness, I am pushing him and his activities.  I added some Ktape to see if this would help with pain and tightness, if it does we can educate him on how to use and do himself  REHAB POTENTIAL: Good  CLINICAL DECISION MAKING: Stable/uncomplicated  EVALUATION COMPLEXITY: Low   GOALS: Goals reviewed with patient? No  SHORT TERM GOALS: Target date: 03/12/22  Patient will be independent with initial HEP. Goal status:met   LONG TERM GOALS: Target date: 04/23/22  Patient will be independent with advanced/ongoing HEP to improve outcomes and carryover.  Goal status: progressing  2.  Patient will report at least 75% improvement in R knee pain to improve QOL. Baseline: 3/10  pain Goal status: partially met  3.  Patient will demonstrate improved R knee AROM to >/= 5-120 deg to allow for normal gait and stair mechanics. Baseline: 15-0-88 Goal status: met at 5-120 degrees flexion  4.  Patient will demonstrate improved functional RLE strength as demonstrated by 5/5. Goal status: progressing  5.  Patient will be able to ambulate 600' with normal gait pattern without increased pain to access community.  Baseline: walking with RW Goal status: met  6. Patient will be able to ascend/descend stairs with 1 HR and reciprocal step pattern safely to access home and community.  Baseline: using cane to navigate stairs Goal status:  met  7.  Patient will report 71 on FOTO to demonstrate improved functional ability. Baseline: 35 at eval, 54 03/05/22 Goal status: met  PLAN: PT FREQUENCY: 2x/week  PT DURATION: 12 weeks  PLANNED INTERVENTIONS: Therapeutic exercises, Therapeutic activity, Neuromuscular re-education, Balance training, Gait training, Patient/Family education, Self Care, Joint mobilization, Stair training, Electrical stimulation, Cryotherapy, Taping, Vasopneumatic device, Ionotophoresis 63m/ml Dexamethasone, and Manual therapy  PLAN FOR NEXT SESSION: I will try over the next month to really get him back to his norm but I have cautioned him about over doing it ASumner Boast PT 06/09/2022, 3:33 PM

## 2022-06-10 ENCOUNTER — Ambulatory Visit (INDEPENDENT_AMBULATORY_CARE_PROVIDER_SITE_OTHER): Payer: Medicare Other

## 2022-06-10 DIAGNOSIS — J309 Allergic rhinitis, unspecified: Secondary | ICD-10-CM

## 2022-06-11 ENCOUNTER — Encounter: Payer: Self-pay | Admitting: Physical Therapy

## 2022-06-11 ENCOUNTER — Ambulatory Visit: Payer: Medicare Other | Admitting: Physical Therapy

## 2022-06-11 DIAGNOSIS — M25661 Stiffness of right knee, not elsewhere classified: Secondary | ICD-10-CM

## 2022-06-11 DIAGNOSIS — R600 Localized edema: Secondary | ICD-10-CM | POA: Diagnosis not present

## 2022-06-11 DIAGNOSIS — R262 Difficulty in walking, not elsewhere classified: Secondary | ICD-10-CM | POA: Diagnosis not present

## 2022-06-11 DIAGNOSIS — M25561 Pain in right knee: Secondary | ICD-10-CM

## 2022-06-11 NOTE — Therapy (Signed)
OUTPATIENT PHYSICAL THERAPY LOWER EXTREMITY TREATMENT    Patient Name: Arthur Moore MRN: [id number] DOB:Oct 13, 1950, 72 y.o., male Today's Date: 06/11/2022   PT End of Session - 06/11/22 1534     Visit Number 31    Date for PT Re-Evaluation 07/02/22    Authorization Type Medicare    PT Start Time 1530    PT Stop Time 1615    PT Time Calculation (min) 45 min    Activity Tolerance Patient tolerated treatment well    Behavior During Therapy [facility] for tasks assessed/performed                Past Medical History:  Diagnosis Date   Aortic insufficiency    Arthritis    Asthma    Carotid bruit 07/09/2021   Cervical disc disease    Depression    Diastolic dysfunction A999333   Hyperlipidemia 08/24/2015   Hypertension    Lumbar disc disease    Psoriasis    Past Surgical History:  Procedure Laterality Date   CHOLECYSTECTOMY  2012   HERNIA REPAIR     LEFT HEART CATH AND CORONARY ANGIOGRAPHY N/A 07/08/2018   Procedure: LEFT HEART CATH AND CORONARY ANGIOGRAPHY;  Surgeon: Burnell Blanks, MD;  Location: [place] [facility];  Service: Cardiovascular;  Laterality: N/A;   PAROTIDECTOMY Right 1992   PROSTATE SURGERY  2002, 2004   [place]     SINUS EXPLORATION  2013   SPINE SURGERY  2013   L C7-T1 laminectomy and discectomy   TONSILLECTOMY     Patient Active Problem List   Diagnosis Date Noted   Snoring 02/14/2022   Mild sleep apnea 02/12/2022   Carotid bruit 07/09/2021   Aneurysm of ascending aorta ([place]) 06/28/2020   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   CAD (coronary artery disease) 07/09/2018   CKD (chronic kidney disease), 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   Chest pain of uncertain etiology A999333   Chronic cough 06/21/2018   Thrombocytopenia ([place]) 12/01/2016   Transaminitis A999333   Diastolic dysfunction 0000000   MDD (major depressive disorder), recurrent severe, without psychosis ([place]) 10/13/2015   Hyperlipidemia 08/24/2015    Arthritis    Depression    Hypertension    Aortic insufficiency    Psoriasis    Cervical disc disease    Lumbar disc disease    Asthma 01/06/2015   Psoriatic arthritis ([place]) 01/06/2015   Allergic rhinoconjunctivitis 01/06/2015   GERD (gastroesophageal reflux disease) 01/06/2015   Laryngopharyngeal reflux (LPR) 01/06/2015    PCP: No PCP  REFERRING PROVIDER: Victorino December  REFERRING DIAG: Z47.89   THERAPY DIAG:  Acute pain of right knee  Localized edema  Stiffness of right knee, not elsewhere classified  Difficulty in walking, not elsewhere classified  Rationale for Evaluation and Treatment Rehabilitation  ONSET DATE: 12/26/21  SUBJECTIVE:   SUBJECTIVE STATEMENT: I think the tape helped, less pain PERTINENT HISTORY: R meniscus repait post op 7 weeks  PAIN:  Are you having pain? Yes right knee 1/10, tight and ache PRECAUTIONS: None  WEIGHT BEARING RESTRICTIONS No  FALLS:  Has patient fallen in last 6 months? No  LIVING ENVIRONMENT: Lives with: lives with their spouse Lives in: House/apartment Stairs: Yes: Internal: 12 steps; on right going up Has following equipment at home: Single point cane and Walker - 2 wheeled  OCCUPATION: Retired  PLOF: Independent  PATIENT GOALS go out and play pickleball    OBJECTIVE:   PATIENT SURVEYS:  FOTO 35/100  COGNITION:  Overall cognitive  status: Within functional limits for tasks assessed     SENSATION: WFL   MUSCLE LENGTH: Hamstrings: Right tightness limiting ROM   POSTURE: No Significant postural limitations and rounded shoulders  PALPATION: No TTP, some swelling in R knee   LOWER EXTREMITY ROM:  Active ROM Right eval 02/07/22 03/03/22 03/10/22 03/31/22 04/22/22  Hip flexion        Hip extension        Hip abduction        Hip adduction        Hip internal rotation        Hip external rotation        Knee flexion 88 105 120 120 120 122  Knee extension -15 -10 7 5 10 6  $ Ankle dorsiflexion         Ankle plantarflexion        Ankle inversion        Ankle eversion         (Blank rows = not tested)  LOWER EXTREMITY MMT:  MMT Right eval Right 04/30/22  Hip flexion 4   Hip extension    Hip abduction    Hip adduction    Hip internal rotation    Hip external rotation    Knee flexion 4 5-  Knee extension 3+ w/pain 4+  Ankle dorsiflexion    Ankle plantarflexion    Ankle inversion    Ankle eversion     (Blank rows = not tested)  FUNCTIONAL TESTS:  5 times sit to stand: 18.89s Timed up and go (TUG): 17.53s  02/24/22 12 seconds   04/22/22 8 seconds  GAIT: Distance walked: 48f Assistive device utilized: WEnvironmental consultant- 2 wheeled and able to do TUG without AD  Level of assistance: Modified independence Comments: decreased stance time on RLE, decreased step length, antalgic gait, decreased R knee flexion,     TODAY'S TREATMENT: 06/11/22 Bike level 5 x 6 minutes power burst x 2 Volleyball for lateral motions Gait outside fast pace uneven terrain Step ups 6" fwd, side snd cross overs 70# resisted gait all directions Single leg press 40# Passive stretch to the HS, ITB Ktape to decrease stress on the ITB  06/09/22 Bike level 5 x 6 minutes power burst x 4 Volleyball Tmill push and pull 20s x 3 each LEg press 40# 2x10, then right only 2x10 Bridge with clamshell green tband STM to the right ITB and HS Ktape to the distal ITB  06/05/22 Fast walk around the building  Bike level 5 x 7 minutes 3 power bursts Volleyball working on lateral motions Side shuffling, direction changes quickly Passive stretch HS, ITB and piriformis STM to the right HS and ITB  06/03/22 Bike level 5 x 6 minutes Fast walk around the back building Tried lateal shuffling Back pedal fast forward 3 steps Volley ball LE stretches Resisted gait all directions  05/28/22 Bike level 5 with 5x power burst x 7 mintues Tmill off push and pull Volleyball Leg extension right only 10# 3x10 Sit to stand with  OHP 20# 3x8 Resisted gait in the hall black tband 5# hip extensiona nd abduction 2x10 4" step ups not using toe for assist  05/26/22 Walk outside around the building, brisk pace Bike level 5 x 7 minutes 5x power burst Volleyball with movements for lateral motions Tmill off push and pull 30s x 3 each Education on HR, HIIT and other ways for him to push  His aerobic capacity as he had a lot of  quesitons LE stretches  05/21/22 Bike level 5 x 7 minutes with 4x power burst Calf stretch Tmill push and pull 20s 4x each Volleyball with some lateral motions Fast pace walk around the back building On bosu balance, reaching, head turns Leg extension 10# right only Leg press right only 20# Leg curls 25# right only 3x10  PATIENT EDUCATION:  Education details: POC Person educated: Patient Education method: Explanation Education comprehension: verbalized understanding   HOME EXERCISE PROGRAM: Access Code: [email or web address] URL: [email or web address] Date: 04/22/2022 Prepared by: Lum Babe  Exercises - Supine ITB Stretch with Strap  - 2 x daily - 7 x weekly - 1 sets - 5 reps - 30 hold - Standing ITB Stretch  - 2 x daily - 7 x weekly - 1 sets - 5 reps - 30 holdLAQ, SAQ, knee flexion seated- given from [place]:  CLINICAL IMPRESSION: Patient felt like the Ktape helped some.  We repeated it today, I can get him full flat extension in long sitting without pain but with some stretch.  Tolerates the ITB stretch well.  Gave him a piece of Ktape and educated on application he is to tape himself and try pickleball again on Sunday  REHAB POTENTIAL: Good  CLINICAL DECISION MAKING: Stable/uncomplicated  EVALUATION COMPLEXITY: Low   GOALS: Goals reviewed with patient? No  SHORT TERM GOALS: Target date: 03/12/22  Patient will be independent with initial HEP. Goal status:met   LONG TERM GOALS: Target date: 04/23/22  Patient will be independent with  advanced/ongoing HEP to improve outcomes and carryover.  Goal status: progressing  2.  Patient will report at least 75% improvement in R knee pain to improve QOL. Baseline: 3/10 pain Goal status: partially met  3.  Patient will demonstrate improved R knee AROM to >/= 5-120 deg to allow for normal gait and stair mechanics. Baseline: 15-0-88 Goal status: met at 5-120 degrees flexion  4.  Patient will demonstrate improved functional RLE strength as demonstrated by 5/5. Goal status: progressing  5.  Patient will be able to ambulate 600' with normal gait pattern without increased pain to access community.  Baseline: walking with RW Goal status: met  6. Patient will be able to ascend/descend stairs with 1 HR and reciprocal step pattern safely to access home and community.  Baseline: using cane to navigate stairs Goal status:  met  7.  Patient will report 33 on FOTO to demonstrate improved functional ability. Baseline: 35 at eval, 54 03/05/22 Goal status: met  PLAN: PT FREQUENCY: 2x/week  PT DURATION: 12 weeks  PLANNED INTERVENTIONS: Therapeutic exercises, Therapeutic activity, Neuromuscular re-education, Balance training, Gait training, Patient/Family education, Self Care, Joint mobilization, Stair training, Electrical stimulation, Cryotherapy, Taping, Vasopneumatic device, Ionotophoresis 23m/ml Dexamethasone, and Manual therapy  PLAN FOR NEXT SESSION: I will try over the next month to really get him back to his norm but I have cautioned him about over doing it ASumner Boast PT 06/11/2022, 3:34 PM

## 2022-06-17 ENCOUNTER — Ambulatory Visit: Payer: Medicare Other | Admitting: Physical Therapy

## 2022-06-17 ENCOUNTER — Encounter: Payer: Self-pay | Admitting: Physical Therapy

## 2022-06-17 DIAGNOSIS — M25661 Stiffness of right knee, not elsewhere classified: Secondary | ICD-10-CM | POA: Diagnosis not present

## 2022-06-17 DIAGNOSIS — M25561 Pain in right knee: Secondary | ICD-10-CM

## 2022-06-17 DIAGNOSIS — R262 Difficulty in walking, not elsewhere classified: Secondary | ICD-10-CM

## 2022-06-17 DIAGNOSIS — R600 Localized edema: Secondary | ICD-10-CM

## 2022-06-17 NOTE — Therapy (Signed)
OUTPATIENT PHYSICAL THERAPY LOWER EXTREMITY TREATMENT    Patient Name: Dack Rivett MRN: [id number] DOB:09-26-1950, 72 y.o., male Today's Date: 06/17/2022   PT End of Session - 06/17/22 1531     Visit Number 32    Date for PT Re-Evaluation 07/02/22    Authorization Type Medicare    PT Start Time 1529    PT Stop Time [id number]    PT Time Calculation (min) 46 min    Activity Tolerance Patient tolerated treatment well    Behavior During Therapy [facility] for tasks assessed/performed                Past Medical History:  Diagnosis Date   Aortic insufficiency    Arthritis    Asthma    Carotid bruit 07/09/2021   Cervical disc disease    Depression    Diastolic dysfunction [id number]   Hyperlipidemia 08/24/2015   Hypertension    Lumbar disc disease    Psoriasis    Past Surgical History:  Procedure Laterality Date   CHOLECYSTECTOMY  2012   HERNIA REPAIR     LEFT HEART CATH AND CORONARY ANGIOGRAPHY N/A 07/08/2018   Procedure: LEFT HEART CATH AND CORONARY ANGIOGRAPHY;  Surgeon: Burnell Blanks, MD;  Location: [place] [facility];  Service: Cardiovascular;  Laterality: N/A;   PAROTIDECTOMY Right 1992   PROSTATE SURGERY  2002, 2004   [place]     SINUS EXPLORATION  2013   SPINE SURGERY  2013   L C7-T1 laminectomy and discectomy   TONSILLECTOMY     Patient Active Problem List   Diagnosis Date Noted   Snoring 02/14/2022   Mild sleep apnea 02/12/2022   Carotid bruit 07/09/2021   Aneurysm of ascending aorta ([place]) 06/28/2020   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   CAD (coronary artery disease) 07/09/2018   CKD (chronic kidney disease), 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   Chest pain of uncertain etiology [id number]   Chronic cough 06/21/2018   Thrombocytopenia ([place]) 12/01/2016   Transaminitis A999333   Diastolic dysfunction 0000000   MDD (major depressive disorder), recurrent severe, without psychosis ([place]) 10/13/2015   Hyperlipidemia 08/24/2015    Arthritis    Depression    Hypertension    Aortic insufficiency    Psoriasis    Cervical disc disease    Lumbar disc disease    Asthma 01/06/2015   Psoriatic arthritis ([place]) 01/06/2015   Allergic rhinoconjunctivitis 01/06/2015   GERD (gastroesophageal reflux disease) 01/06/2015   Laryngopharyngeal reflux (LPR) 01/06/2015    PCP: No PCP  REFERRING PROVIDER: Victorino December  REFERRING DIAG: Z47.89   THERAPY DIAG:  Acute pain of right knee  Localized edema  Stiffness of right knee, not elsewhere classified  Difficulty in walking, not elsewhere classified  Rationale for Evaluation and Treatment Rehabilitation  ONSET DATE: 12/26/21  SUBJECTIVE:   SUBJECTIVE STATEMENT: Patient reports that he is doing better, he still has c/o tightness in the distal ITB PERTINENT HISTORY: R meniscus repait post op 7 weeks  PAIN:  Are you having pain? Yes right knee 1/10, tight and ache PRECAUTIONS: None  WEIGHT BEARING RESTRICTIONS No  FALLS:  Has patient fallen in last 6 months? No  LIVING ENVIRONMENT: Lives with: lives with their spouse Lives in: House/apartment Stairs: Yes: Internal: 12 steps; on right going up Has following equipment at home: Single point cane and Walker - 2 wheeled  OCCUPATION: Retired  PLOF: Independent  PATIENT GOALS go out and play pickleball    OBJECTIVE:   PATIENT  SURVEYS:  FOTO 35/100  COGNITION:  Overall cognitive status: Within functional limits for tasks assessed     SENSATION: WFL   MUSCLE LENGTH: Hamstrings: Right tightness limiting ROM   POSTURE: No Significant postural limitations and rounded shoulders  PALPATION: No TTP, some swelling in R knee   LOWER EXTREMITY ROM:  Active ROM Right eval 02/07/22 03/03/22 03/10/22 03/31/22 04/22/22  Hip flexion        Hip extension        Hip abduction        Hip adduction        Hip internal rotation        Hip external rotation        Knee flexion 88 105 120 120 120 122  Knee  extension -15 -10 7 5 10 6  $ Ankle dorsiflexion        Ankle plantarflexion        Ankle inversion        Ankle eversion         (Blank rows = not tested)  LOWER EXTREMITY MMT:  MMT Right eval Right 04/30/22  Hip flexion 4   Hip extension    Hip abduction    Hip adduction    Hip internal rotation    Hip external rotation    Knee flexion 4 5-  Knee extension 3+ w/pain 4+  Ankle dorsiflexion    Ankle plantarflexion    Ankle inversion    Ankle eversion     (Blank rows = not tested)  FUNCTIONAL TESTS:  5 times sit to stand: 18.89s Timed up and go (TUG): 17.53s  02/24/22 12 seconds   04/22/22 8 seconds  GAIT: Distance walked: 31f Assistive device utilized: WEnvironmental consultant- 2 wheeled and able to do TUG without AD  Level of assistance: Modified independence Comments: decreased stance time on RLE, decreased step length, antalgic gait, decreased R knee flexion,     TODAY'S TREATMENT: 06/17/22 Bike Level 5 x 7 minutes 3 power bursts 10# hip abduction, extension and flexion Leg press 60# x10, right only 40# 2x10 Gait around the building brisk pace Passive stretch to the HS and ITB Ktape to help unload the distal ITB  06/11/22 Bike level 5 x 6 minutes power burst x 2 Volleyball for lateral motions Gait outside fast pace uneven terrain Step ups 6" fwd, side snd cross overs 70# resisted gait all directions Single leg press 40# Passive stretch to the HS, ITB Ktape to decrease stress on the ITB  06/09/22 Bike level 5 x 6 minutes power burst x 4 Volleyball Tmill push and pull 20s x 3 each LEg press 40# 2x10, then right only 2x10 Bridge with clamshell green tband STM to the right ITB and HS Ktape to the distal ITB  06/05/22 Fast walk around the building  Bike level 5 x 7 minutes 3 power bursts Volleyball working on lateral motions Side shuffling, direction changes quickly Passive stretch HS, ITB and piriformis STM to the right HS and ITB  06/03/22 Bike level 5 x 6  minutes Fast walk around the back building Tried lateal shuffling Back pedal fast forward 3 steps Volley ball LE stretches Resisted gait all directions  05/28/22 Bike level 5 with 5x power burst x 7 mintues Tmill off push and pull Volleyball Leg extension right only 10# 3x10 Sit to stand with OHP 20# 3x8 Resisted gait in the hall black tband 5# hip extensiona nd abduction 2x10 4" step ups not using toe for assist  PATIENT EDUCATION:  Education details: POC Person educated: Patient Education method: Explanation Education comprehension: verbalized understanding   HOME EXERCISE PROGRAM: Access Code: [email or web address] URL: [email or web address] Date: 04/22/2022 Prepared by: Lum Babe  Exercises - Supine ITB Stretch with Strap  - 2 x daily - 7 x weekly - 1 sets - 5 reps - 30 hold - Standing ITB Stretch  - 2 x daily - 7 x weekly - 1 sets - 5 reps - 30 holdLAQ, SAQ, knee flexion seated- given from [place]:  CLINICAL IMPRESSION: Patient reports that he did not try the pickleball again, he does report trying to be more consistent with the gym, he does report that his A1C has gone up and he needs to be more active REHAB POTENTIAL: Good  CLINICAL DECISION MAKING: Stable/uncomplicated  EVALUATION COMPLEXITY: Low   GOALS: Goals reviewed with patient? No  SHORT TERM GOALS: Target date: 03/12/22  Patient will be independent with initial HEP. Goal status:met   LONG TERM GOALS: Target date: 04/23/22  Patient will be independent with advanced/ongoing HEP to improve outcomes and carryover.  Goal status: progressing  2.  Patient will report at least 75% improvement in R knee pain to improve QOL. Baseline: 3/10 pain Goal status: partially met  3.  Patient will demonstrate improved R knee AROM to >/= 5-120 deg to allow for normal gait and stair mechanics. Baseline: 15-0-88 Goal status: met at 5-120 degrees flexion  4.  Patient will demonstrate  improved functional RLE strength as demonstrated by 5/5. Goal status: progressing  5.  Patient will be able to ambulate 600' with normal gait pattern without increased pain to access community.  Baseline: walking with RW Goal status: met  6. Patient will be able to ascend/descend stairs with 1 HR and reciprocal step pattern safely to access home and community.  Baseline: using cane to navigate stairs Goal status:  met  7.  Patient will report 83 on FOTO to demonstrate improved functional ability. Baseline: 35 at eval, 54 03/05/22 Goal status: met  PLAN: PT FREQUENCY: 2x/week  PT DURATION: 12 weeks  PLANNED INTERVENTIONS: Therapeutic exercises, Therapeutic activity, Neuromuscular re-education, Balance training, Gait training, Patient/Family education, Self Care, Joint mobilization, Stair training, Electrical stimulation, Cryotherapy, Taping, Vasopneumatic device, Ionotophoresis 49m/ml Dexamethasone, and Manual therapy  PLAN FOR NEXT SESSION: I will try over the next month to really get him back to his norm but I have cautioned him about over doing it [employer] PT 06/17/2022, 3:32 PM

## 2022-06-18 ENCOUNTER — Telehealth: Payer: Self-pay | Admitting: Cardiovascular Disease

## 2022-06-18 NOTE — Telephone Encounter (Signed)
Pt c/o Shortness Of Breath: STAT if SOB developed within the last 24 hours or pt is noticeably SOB on the phone  1. Are you currently SOB (can you hear that pt is SOB on the phone)? no  2. How long have you been experiencing SOB? About a month or longer  3. Are you SOB when sitting or when up moving around? Moving around, walking and sometimes just sitting  4. Are you currently experiencing any other symptoms? no  Patient he has been having SOB mostly when moving around, but sometimes when he is sitting. He says he has been working out and it is hard to do that now.

## 2022-06-18 NOTE — Telephone Encounter (Signed)
Returned call to patient who states he has been breathing heavy and gasping for approximately one month. He states these episodes happen frequently  Pt denies any precipitating events. He refused an appt. Today with DOD.  He does not want to go to the ER.  Pt scheduled an appt.tomorrow 06/19/2022 with Dr. Oval Linsey per his request.  Pt verbalized understanding of instructions provided to go to the ED if symptoms worsen. Georgana Curio MHA RN CCM

## 2022-06-19 ENCOUNTER — Encounter (HOSPITAL_BASED_OUTPATIENT_CLINIC_OR_DEPARTMENT_OTHER): Payer: Self-pay | Admitting: Cardiovascular Disease

## 2022-06-19 ENCOUNTER — Ambulatory Visit (INDEPENDENT_AMBULATORY_CARE_PROVIDER_SITE_OTHER): Payer: Medicare Other | Admitting: Cardiovascular Disease

## 2022-06-19 ENCOUNTER — Ambulatory Visit: Payer: Medicare Other | Admitting: Physical Therapy

## 2022-06-19 ENCOUNTER — Other Ambulatory Visit (HOSPITAL_COMMUNITY): Payer: Self-pay

## 2022-06-19 VITALS — BP 126/62 | HR 71 | Ht 71.5 in | Wt 193.5 lb

## 2022-06-19 DIAGNOSIS — R0609 Other forms of dyspnea: Secondary | ICD-10-CM | POA: Diagnosis not present

## 2022-06-19 DIAGNOSIS — I10 Essential (primary) hypertension: Secondary | ICD-10-CM

## 2022-06-19 DIAGNOSIS — I351 Nonrheumatic aortic (valve) insufficiency: Secondary | ICD-10-CM

## 2022-06-19 DIAGNOSIS — I25118 Atherosclerotic heart disease of native coronary artery with other forms of angina pectoris: Secondary | ICD-10-CM | POA: Diagnosis not present

## 2022-06-19 DIAGNOSIS — E782 Mixed hyperlipidemia: Secondary | ICD-10-CM

## 2022-06-19 DIAGNOSIS — E785 Hyperlipidemia, unspecified: Secondary | ICD-10-CM

## 2022-06-19 DIAGNOSIS — I7121 Aneurysm of the ascending aorta, without rupture: Secondary | ICD-10-CM | POA: Diagnosis not present

## 2022-06-19 DIAGNOSIS — Z01812 Encounter for preprocedural laboratory examination: Secondary | ICD-10-CM

## 2022-06-19 MED ORDER — IVABRADINE HCL 5 MG PO TABS
ORAL_TABLET | ORAL | 0 refills | Status: DC
  Filled 2022-06-19: qty 2, 1d supply, fill #0

## 2022-06-19 NOTE — Patient Instructions (Addendum)
Medication Instructions:  TAKE IVABRADINE 10 MG 2 HOURS PRIOR TO CT   Labwork: BMET 1 WEEK PRIOR TO CT   Testing/Procedures: Your physician has requested that you have an echocardiogram. Echocardiography is a painless test that uses sound waves to create images of your heart. It provides your doctor with information about the size and shape of your heart and how well your heart's chambers and valves are working. This procedure takes approximately one hour. There are no restrictions for this procedure. Please do NOT wear cologne, perfume, aftershave, or lotions (deodorant is allowed). Please arrive 15 minutes prior to your appointment time.  Your physician has requested that you have cardiac CT. Cardiac computed tomography (CT) is a painless test that uses an x-ray machine to take clear, detailed pictures of your heart. For further information please visit [email or web address]. Please follow instruction sheet as given.  Follow-Up: 40 MONTHS WITH DR Conyers W NP   Any Other Special Instructions Will Be Listed Below (If Applicable).    Your cardiac CT will be scheduled at one of the below locations:   [facility] [address] [place], [place] [address] (336) [place] [address] [place], [place] [address] [phone]  [facility] [place], [place] [address] [phone]  If scheduled at [facility], please arrive at the [facility] and Children's Entrance (Entrance C2) of [facility] 30 minutes prior to test start time. You can use the FREE valet parking offered at entrance C (encouraged to control the heart rate for the test)  Proceed to the [facility] Radiology Department (first floor) to check-in and test prep.  All radiology patients and guests should use entrance C2 at [facility], accessed from [facility],  even though the hospital's physical address listed is [address]    If scheduled at [facility] or [facility], please arrive 15 mins early for check-in and test prep.   Please follow these instructions carefully (unless otherwise directed):  Hold all erectile dysfunction medications at least 3 days (72 hrs) prior to test. (Ie viagra, cialis, sildenafil, tadalafil, etc) We will administer nitroglycerin during this exam.   On the Night Before the Test: Be sure to Drink plenty of water. Do not consume any caffeinated/decaffeinated beverages or chocolate 12 hours prior to your test. Do not take any antihistamines 12 hours prior to your test.  On the Day of the Test: Drink plenty of water until 1 hour prior to the test. Do not eat any food 1 hour prior to test. You may take your regular medications prior to the test.  Take metoprolol (Lopressor) two hours prior to test. If you take Furosemide/Hydrochlorothiazide/Spironolactone, please HOLD on the morning of the test.  After the Test: Drink plenty of water. After receiving IV contrast, you may experience a mild flushed feeling. This is normal. On occasion, you may experience a mild rash up to 24 hours after the test. This is not dangerous. If this occurs, you can take Benadryl 25 mg and increase your fluid intake. If you experience trouble breathing, this can be serious. If it is severe call 911 IMMEDIATELY. If it is mild, please call our office. If you take any of these medications: Glipizide/Metformin, Avandament, Glucavance, please do not take 48 hours after completing test unless otherwise instructed.  We will call to schedule  your test 2-4 weeks out understanding that some insurance companies will need an authorization prior to the service being performed.   For non-scheduling related questions, please contact the cardiac imaging nurse navigator should you have any  questions/concerns: Marchia Bond, Cardiac Imaging Nurse Navigator Gordy Clement, Cardiac Imaging Nurse Navigator [facility] Direct Office Dial: [phone]   For scheduling needs, including cancellations and rescheduling, please call [place], [phone].  Cardiac CT Angiogram A cardiac CT angiogram is a procedure to look at the heart and the area around the heart. It may be done to help find the cause of chest pains or other symptoms of heart disease. During this procedure, a substance called contrast dye is injected into a vein in the arm. The contrast highlights the blood vessels in the area to be checked. A large X-ray machine (CT scanner), then takes detailed pictures of the heart and the surrounding area. The procedure is also sometimes called a coronary CT angiogram, coronary artery scanning, or CTA. A cardiac CT angiogram allows the health care provider to see how well blood is flowing to and from the heart. The provider will be able to see if there are any problems, such as: Blockage or narrowing of the arteries in the heart. Fluid around the heart. Signs of weakness or disease in the muscles, valves, and tissues of the heart. Tell a health care provider about: Any allergies you have. This is especially important if you have had a previous allergic reaction to medicines, contrast dye, or iodine. All medicines you are taking, including vitamins, herbs, eye drops, creams, and over-the-counter medicines. Any bleeding problems you have. Any surgeries you have had. Any medical conditions you have, including kidney problems or kidney failure. Whether you are pregnant or may be pregnant. Any anxiety disorders, chronic pain, or other conditions you have. These may increase your stress or prevent you from lying still. Any history of abnormal heart rhythms or heart procedures. What are the risks? Your provider will talk with you about risks. These may  include: Bleeding. Infection. Allergic reactions to medicines or dyes. Damage to other structures or organs. Kidney damage from the contrast dye. Increased risk of cancer from radiation exposure. This risk is low. Talk with your provider about: The risks and benefits of testing. How you can receive the lowest dose of radiation. What happens before the procedure? Wear comfortable clothing and remove any jewelry, glasses, dentures, and hearing aids. Follow instructions from your provider about eating and drinking. These may include: 12 hours before the procedure Avoid caffeine. This includes tea, coffee, soda, energy drinks, and diet pills. Drink plenty of water or other fluids that do not have caffeine in them. Being well hydrated can prevent complications. 4-6 hours before the procedure Stop eating and drinking. This will reduce the risk of nausea from the contrast dye. Ask your provider about changing or stopping your regular medicines. These include: Diabetes medicines. Medicines to treat problems with erections (erectile dysfunction). If you have kidney problems, you may need to receive IV hydration before and after the test. What happens during the procedure?  Hair on your chest may need to be removed so that small sticky patches called electrodes can be placed on your chest. These will transmit information that helps to monitor your heart during the procedure. An IV will be inserted into one of your veins. You might be given a medicine to control your heart rate during the procedure. This will help to ensure that  good images are obtained. You will be asked to lie on an exam table. This table will slide in and out of the CT machine during the procedure. Contrast dye will be injected into the IV. You might feel warm, or you may get a metallic taste in your mouth. You may be given medicines to relax or dilate the arteries in your heart. If you are allergic to contrast dyes or iodine  you may be given medicine before the test to reduce the risk of an allergic reaction. The table that you are lying on will move into the CT machine tunnel for the scan. The person running the machine will give you instructions while the scans are being done. You may be asked to: Keep your arms above your head. Hold your breath for short periods. Stay very still, even if the table is moving. The procedure may vary among providers and hospitals. What can I expect after the procedure? After your procedure, it is common to have: A metallic taste in your mouth from the contrast dye. A feeling of warmth. A headache from the heart medicine. Follow these instructions at home: Take over-the-counter and prescription medicines only as told by your provider. If you are told, drink enough fluid to keep your pee pale yellow. This will help to flush the contrast dye out of your body. Most people can return to their normal activities right after the procedure. Ask your provider what activities are safe for you. It is up to you to get the results of your procedure. Ask your provider, or the department that is doing the procedure, when your results will be ready. Contact a health care provider if: You have any symptoms of allergy to the contrast dye. These include: Shortness of breath. Rash or hives. A racing heartbeat. You notice a change in your peeing (urination). This information is not intended to replace advice given to you by your health care provider. Make sure you discuss any questions you have with your health care provider. Document Revised: 11/15/2021 Document Reviewed: 11/15/2021 Elsevier Patient Education  [place].    If you need a refill on your cardiac medications before your next appointment, please call your pharmacy.

## 2022-06-19 NOTE — Progress Notes (Signed)
Cardiology Office Note  Date:  07/25/2022  ID:  Arthur Moore, DOB June 01, 1950, MRN [id number]  PCP:  [facility] [place]  Cardiologist:   Skeet Latch, MD   No chief complaint on file.   History of Present Illness: Arthur Moore is a 72 y.o. male with mild CAD, mild-moderate aortic insufficiency, hypertension, asthma, TIA, OSA not on CPAP, liver fibrosis, anxiety, and depression who presents for follow up.  Arthur Moore wore a Holter monitor 06/2013 that revealed rare PACs and PVCs.  He previously followed up with a cardiologist in [place] due to mild AR.  He subsequently had an echo at the [place] in 2015 that reported it as mild-moderate.  He had a repeat echocardiogram 12/05/15 revealed LVEF 55-60% with grade 2 diastolic. Aortic regurgitation remained mild to moderate.  Arthur Moore reported fatigue and atypical chest pain.  He was referred for an exercise Myoview 3/18 that revealed LVEF 49% and asynchronous contraction. There was a small defect in the basal inferior region that was thought to be diaphragmatic attenuation.  He achieved 7 METS on a Bruce protocol.  He called our office after these results due to persistent chest pain and it was suggested that he follow up with GI.  He was subsequently diagnosed with diverticulitis.  Arthur Moore saw [name], PA-C, for chest pain.  He had previously been seen in the ED with chest pain.  Cardiac enzymes and CT of the chest, abdomen, and pelvis were unremarkable.  His chest pain was atypical and occurred when he was trying to lie down in bed at night.  He was referred for coronary CT-A 04/2017 that revealed a coronary calcium score 361 which is 78th percentile.  He had extensive calcified plaque in the proximal LAD with possible moderate stenosis.  FFR was negative.  Repeat echo 08/2017 revealed LVEF 55 to 65% with grade 1 diastolic dysfunction.  He was seen in the ED 06/2018 with chest pain.  Cardiac enzymes were  negative.  He underwent cardiac catheterization 06/2018 which revealed 50% stenosis in D1 and otherwise 10 to 20% stenoses in the distal RCA, proximal left circumflex, distal left main, and proximal LAD.  Arthur Moore was started on Vascepa for hypertriglyceridemia and CAD.  It worked well for his triglycerides.  However he was unable to afford it.  He tried to get it filled through the [place] and was denied.  Metoprolol succinate was also denied as were PCSK9 inhibitors.  Arthur Moore was seen by Coletta Memos on 04/2019 after a visit to the ED for chest pain.  His ischemia evaluation was unremarkable.  His chest pain was worse with inhalation.  He had no exertional chest pain.  No further work-up was performed.   He had blood work at the [place] and the numbers were excellent.  His lipids were all at goal. He reported some LE heaviness that he attributed to deconditioning. He was seen in the ED 03/2021 after a head injury while playing pickleball. In 10/2021 he followed up with Laurann Montana, NP for preoperative clearance. He underwent right medial meniscus repair performed by Dr. Stann Mainland in [facility] 11/2021.  He called the office yesterday complaining of shortness of breath for 1 month. Today, he is accompanied by a family member. In the past couple months he has increased exercise intolerance. He complains of dyspnea especially with going up and down stairs, walking around, or even while just sitting. As he sits he frequently needs to catch his breath.  He also endorses orthopnea. Of note, he underwent repair of a torn meniscus August 2023. He is participating in PT but it is difficult due to his shortness of breath. No anginal symptoms. Also he complains of nonexertional left pectoral chest pain. However, he is not sure if this is more related to acid reflux. He often has numbness in his arm. From time to time he may feel a little dizzy, such as when he is lying down. In clinic his BP was initially 112/62 which he  states is low for him. At home his blood pressures average [id number] systolic. On recheck his blood pressure is 126/62. His last LDL was 46. He continues to work on his diet but hasn't been able to exercise consistently. In fact he is also concerned that he is sleeping "way too much." He denies any smoking history. He denies any palpitations, chest pain, shortness of breath, or peripheral edema. No lightheadedness, headaches, syncope, orthopnea, or PND.   Past Medical History:  Diagnosis Date   Aortic insufficiency    Arthritis    Asthma    Carotid bruit 07/09/2021   Cervical disc disease    Depression    Diastolic dysfunction [id number]   Hyperlipidemia 08/24/2015   Hypertension    Lumbar disc disease    Psoriasis     Past Surgical History:  Procedure Laterality Date   CHOLECYSTECTOMY  2012   HERNIA REPAIR     KNEE ARTHROSCOPY W/ MENISCAL REPAIR  11/2021   LEFT HEART CATH AND CORONARY ANGIOGRAPHY N/A 07/08/2018   Procedure: LEFT HEART CATH AND CORONARY ANGIOGRAPHY;  Surgeon: Burnell Blanks, MD;  Location: [facility];  Service: Cardiovascular;  Laterality: N/A;   PAROTIDECTOMY Right 1992   PROSTATE SURGERY  2002, 2004   SINOSCOPY     SINUS EXPLORATION  2013   SPINE SURGERY  2013   L C7-T1 laminectomy and discectomy   TONSILLECTOMY       Current Outpatient Medications  Medication Sig Dispense Refill   ivabradine (CORLANOR) 5 MG TABS tablet TAKE 2 TABLETS BY MOUTH 2 HOURS PRIOR TO CT 2 tablet 0   albuterol (VENTOLIN HFA) 108 (90 Base) MCG/ACT inhaler Inhale 2 puffs into the lungs every 4 (four) hours as needed for wheezing or shortness of breath. 18 each 1   aspirin EC 81 MG tablet Take 81 mg by mouth daily.     budesonide (PULMICORT) 0.25 MG/2ML nebulizer solution Take 2 mLs (0.25 mg total) by nebulization every 4 (four) hours as needed (During flare ups). 150 mL 1   budesonide-formoterol (SYMBICORT) 160-4.5 MCG/ACT inhaler Inhale 2 puffs into the lungs 2 (two)  times daily. 10.2 g 5   cetirizine (ZYRTEC ALLERGY) 10 MG tablet Take 1 tablet (10 mg total) by mouth daily as needed (can take an ectra dose during flare ups). 60 tablet 5   Cholecalciferol (VITAMIN D3) 5000 units TABS Take 5,000 Units by mouth daily.      clonazePAM (KLONOPIN) 0.5 MG tablet Take 1 tablet (0.5 mg total) by mouth 3 (three) times daily as needed for anxiety. 90 tablet 0   fluticasone (FLONASE) 50 MCG/ACT nasal spray Place 2 sprays into both nostrils daily. 1-2 sprays each nostril 1 (ONE) time per day. 16 g 5   lamoTRIgine (LAMICTAL) 200 MG tablet Take 1 tablet (200 mg total) by mouth 2 (two) times daily. 180 tablet 3   loratadine (CLARITIN) 10 MG tablet Take 1 tablet (10 mg total) by mouth daily as needed  for allergies (Can take an extra dose during flare ups.). 60 tablet 5   metoprolol tartrate (LOPRESSOR) 25 MG tablet TAKE 1/2 TABLET BY MOUTH TWICE A DAY 90 tablet 3   nitroGLYCERIN (NITROSTAT) 0.4 MG SL tablet Place 1 tablet (0.4 mg total) under the tongue every 5 (five) minutes as needed for chest pain. 25 tablet 3   pantoprazole (PROTONIX) 40 MG tablet Take 1 tablet (40 mg total) by mouth 2 (two) times daily. Take 1 tablet by mouth 1-2 times per day. 180 tablet 1   rosuvastatin (CRESTOR) 10 MG tablet Take 1 tablet by mouth daily.     Spacer/Aero-Holding Chambers DEVI 1 Device by Does not apply route as directed. 1 each 1   zinc gluconate 50 MG tablet Take 1 tablet (50 mg total) by mouth daily.     No current facility-administered medications for this visit.    Allergies:   Atorvastatin and Zolpidem    Social History:  The patient  reports that he has never smoked. He has never used smokeless tobacco. He reports that he does not currently use alcohol. He reports that he does not use drugs.   Family History:  The patient's family history includes Bipolar disorder in his brother; Cancer in an other family member; Colon cancer in his mother; Lung cancer in his father.    ROS:    Please see the history of present illness. (+) Shortness of breath (+) Left pectoral chest pain/acid reflux (+) Dizziness All other systems are reviewed and negative.    PHYSICAL EXAM: VS:  BP 126/62 (BP Location: Right Arm, Patient Position: Sitting, Cuff Size: Large)   Pulse 71   Ht 5' 11.5" (1.816 m)   Wt 193 lb 8 oz (87.8 kg)   SpO2 99%   BMI 26.61 kg/m  , BMI Body mass index is 26.61 kg/m. GENERAL:  Well appearing HEENT: Pupils equal round and reactive, fundi not visualized, oral mucosa unremarkable NECK:  No jugular venous distention, waveform within normal limits, carotid upstroke brisk and symmetric, no bruits LUNGS:  Clear to auscultation bilaterally HEART:  RRR.  PMI not displaced or sustained,S1 and S2 within normal limits, no S3, no S4, no clicks, no rubs, no murmurs ABD:  Flat, positive bowel sounds normal in frequency in pitch, no bruits, no rebound, no guarding, no midline pulsatile mass, no hepatomegaly, no splenomegaly EXT:  2 plus pulses throughout, no edema, no cyanosis no clubbing SKIN:  No rashes no nodules NEURO:  Cranial nerves II through XII grossly intact, motor grossly intact throughout PSYCH:  Cognitively intact, oriented to person place and time  EKG:  EKG is personally reviewed. 06/19/2022:  EKG was not ordered. 07/08/2021: Sinus rhythm. Rate 60 bpm. 01/01/2020: Sinus rhythm.  Rate 63 bpm. 03/16/2019: Sinus rhythm.  Rate 64 bpm. 12/29/17: Sinus rhythm.  Rate 61 bpm. 06/20/16: Sinus rhythm. Rate 64 bpm.   Bilateral Carotid Doppler  07/23/2021: Summary:  Right Carotid: The extracranial vessels were near-normal with only minimal  wall                thickening or plaque.   Left Carotid: The extracranial vessels were near-normal with only minimal  wall               thickening or plaque.   Vertebrals:  Bilateral vertebral arteries demonstrate antegrade flow.  Subclavians: Normal flow hemodynamics were seen in bilateral subclavian                arteries.  Echo 07/20/2020:  1. Left ventricular ejection fraction, by estimation, is 50 to 55%. The  left ventricle has low normal function. The left ventricle has no regional  wall motion abnormalities. The left ventricular internal cavity size was  mildly dilated. Left ventricular  diastolic parameters are consistent with Grade I diastolic dysfunction  (impaired relaxation).   2. Right ventricular systolic function is normal. The right ventricular  size is normal.   3. The mitral valve is normal in structure. Trivial mitral valve  regurgitation. No evidence of mitral stenosis.   4. The aortic valve is tricuspid. Aortic valve regurgitation is mild.  Mild aortic valve sclerosis is present, with no evidence of aortic valve  stenosis.   5. The inferior vena cava is normal in size with greater than 50%  respiratory variability, suggesting right atrial pressure of 3 mmHg.   CT Chest 04/11/2020:  IMPRESSION: Previously seen suspicious nodular density is confirmed to represent focal scarring stable in appearance from a prior CT examination from 2019 as well as improved aeration in the bases showing left parenchymal density. These changes are likely related to adjacent osteophytic change within the thoracic spine.   No acute abnormality noted.   Aortic Atherosclerosis (ICD10-I70.0) and Emphysema (ICD10-J43.9).  LE Venous Doppler 02/06/2020: Summary:  RIGHT:  - No evidence of common femoral vein obstruction.     LEFT:  - There is no evidence of deep vein thrombosis in the lower extremity.   LHC 07/08/18: Dist RCA lesion is 20% stenosed. Prox Cx lesion is 10% stenosed. Dist LM to Ost LAD lesion is 20% stenosed. Ost 1st Diag lesion is 50% stenosed. The left ventricular systolic function is normal. LV end diastolic pressure is normal. The left ventricular ejection fraction is 50-55% by visual estimate. There is no mitral valve regurgitation. Prox LAD lesion is 20% stenosed.    1. Mild non-obstructive CAD 2. Low normal LV systolic function  Echo [id number]: Study Conclusions   - Left ventricle: The cavity size was normal. There was mild   concentric hypertrophy. Systolic function was normal. The   estimated ejection fraction was in the range of 55% to 65%. Wall   motion was normal; there were no regional wall motion   abnormalities. Doppler parameters are consistent with abnormal   left ventricular relaxation (grade 1 diastolic dysfunction). - Aortic valve: There was mild to moderate regurgitation directed   eccentrically to the anterior leaflet of the mitral valve. - Left atrium: The atrium was normal in size. - Right ventricle: Systolic function was normal. - Pulmonary arteries: Systolic pressure was within the normal   range.  Coronary CT-A 05/15/17: IMPRESSION: 1. Coronary artery calcium score 361 Agatston units, placing the patient in the 78th percentile for age and gender, suggesting intermediate to high risk for future cardiac events.   2. Extensive calcified plaque in the proximal LAD, possible moderate stenosis though blooming artifact may make the disease look worse than it actually is.  FFR 0.83 in mid LAD. I do no think there is hemodynamically significant disease in the LAD. The LCx and RCA have no hemodynamically significant disease.  Exercise Myoview 06/26/16: Nuclear stress EF: 49%. Asynchronous contraction. The left ventricular ejection fraction is mildly decreased (45-54%). There was no ST segment deviation noted during stress. Defect 1: There is a small defect of mild severity present in the basal inferior location. Likely diaphragmatic attenuation artifact. This is a low risk study. No ischemia identified  Echo 12/05/15: Study Conclusions   - Left  ventricle: Global LV longitudinal strain is normal at -16.2%   There is a false tendon in the LV apex of no clinical   significance. The cavity size was normal. There was moderate   focal  basal hypertrophy. Systolic function was normal. The   estimated ejection fraction was in the range of 55% to 60%. Wall   motion was normal; there were no regional wall motion   abnormalities. Features are consistent with a pseudonormal left   ventricular filling pattern, with concomitant abnormal relaxation   and increased filling pressure (grade 2 diastolic dysfunction). - Aortic valve: Trileaflet; mildly thickened leaflets. There was   mild to moderate regurgitation directed eccentrically in the LVOT   and towards the mitral anterior leaflet. - Mitral valve: There was mild regurgitation. - Pulmonic valve: There was mild regurgitation.  Echo 04/25/14: LVEF 60%.  Aortic valve sclerosis without stenosis. Mild aortic insufficiency.  ETT 04/2013:  Exercise 8 minutes and 44 seconds on a Bruce protocol. Peak heart rate 136. 9.8 METS. No ischemic changes.  24 Hour Holter: Underlying rhythm is sinus. Average heart rate 74, minimum 52, max 123. Rare PACs and PVCs. No significant arrhythmia noted.   Recent Labs: 11/12/2021: B Natriuretic Peptide 31.8 03/21/2022: Hemoglobin 15.7; Magnesium 2.1; Platelets 139; TSH 1.661 07/02/2022: BUN 17; Creatinine, Ser 1.25; Potassium 4.2; Sodium 143   Lipid Panel    Component Value Date/Time   CHOL 181 10/18/2020 1130   TRIG 165 (H) 10/18/2020 1130   HDL 29 (L) 10/18/2020 1130   CHOLHDL 6.2 (H) 10/18/2020 1130   CHOLHDL 4.1 07/09/2018 0407   VLDL 25 07/09/2018 0407   LDLCALC 122 (H) 10/18/2020 1130      11/2014: Total cholesterol 130, HDL 39, LDL 65, triglycerides 116 Total chol 133, tri 211, ldl 55, hdl 34  Wt Readings from Last 3 Encounters:  07/11/22 196 lb (88.9 kg)  07/08/22 192 lb 14.4 oz (87.5 kg)  06/19/22 193 lb 8 oz (87.8 kg)      ASSESSMENT AND PLAN:  Hypertension Blood pressure is very well-controlled.  Continue metoprolol.  Continue with diet and regular exercise.  CAD (coronary artery disease) He has known non-obstructive  CAD.  However, he now has increasing exertional dyspnea.  We will get a coronary CT-A to see if there has been progression of his CAD.  BP is too low to give more nodal agents for the study.  Will treat with ivabradine prior to the CT.  Aneurysm of ascending aorta (HCC) This has been stable.  Will evaluate on the coronary CT-A.  Continue metoprolol.  Aortic insufficiency Stable on multiple echos  He has no heart failure symptoms.  Hyperlipidemia Continue rosuvastatin, diet, and exercise.   Disposition:   FU with Anabela Crayton C. Oval Linsey, MD in 6 months.  Medication Adjustments/Labs and Tests Ordered: Current medicines are reviewed at length with the patient today.  Concerns regarding medicines are outlined above.   Orders Placed This Encounter  Procedures   CT CORONARY MORPH W/CTA COR W/SCORE W/CA W/CM &/OR WO/CM   Basic metabolic panel   ECHOCARDIOGRAM COMPLETE   Meds ordered this encounter  Medications   ivabradine (CORLANOR) 5 MG TABS tablet    Sig: TAKE 2 TABLETS BY MOUTH 2 HOURS PRIOR TO CT    Dispense:  2 tablet    Refill:  0   Patient Instructions  Medication Instructions:  TAKE IVABRADINE 10 MG 2 HOURS PRIOR TO CT   Labwork: BMET 1 WEEK PRIOR TO CT  Testing/Procedures: Your physician has requested that you have an echocardiogram. Echocardiography is a painless test that uses sound waves to create images of your heart. It provides your doctor with information about the size and shape of your heart and how well your heart's chambers and valves are working. This procedure takes approximately one hour. There are no restrictions for this procedure. Please do NOT wear cologne, perfume, aftershave, or lotions (deodorant is allowed). Please arrive 15 minutes prior to your appointment time.  Your physician has requested that you have cardiac CT. Cardiac computed tomography (CT) is a painless test that uses an x-ray machine to take clear, detailed pictures of your heart. For  further information please visit [email or web address]. Please follow instruction sheet as given.  Follow-Up: 31 MONTHS WITH DR Carter W NP   Any Other Special Instructions Will Be Listed Below (If Applicable).    Your cardiac CT will be scheduled at one of the below locations:   [facility] [address] [place], [place] [address] [phone] [address] [place], [place] [address] [phone]  [facility] [place],  [address] [phone]  If scheduled at [facility], please arrive at the [facility] and Children's Entrance (Entrance C2) of [facility] 30 minutes prior to test start time. You can use the FREE valet parking offered at entrance C (encouraged to control the heart rate for the test)  Proceed to the [facility] Radiology Department (first floor) to check-in and test prep.  All radiology patients and guests should use entrance C2 at [facility], accessed from [facility], even though the hospital's physical address listed is [address]    If scheduled at [facility] or [facility], please arrive 15 mins early for check-in and test prep.   Please follow these instructions carefully (unless otherwise directed):  Hold all erectile dysfunction medications at least 3 days (72 hrs) prior to test. (Ie viagra, cialis, sildenafil, tadalafil, etc) We will administer nitroglycerin during this exam.   On the Night Before the Test: Be sure to Drink plenty of water. Do not consume any caffeinated/decaffeinated beverages or chocolate 12 hours prior to your test. Do not take any antihistamines 12 hours prior to your test.  On the Day of the Test: Drink plenty of water until 1 hour prior to the test. Do not eat any food 1 hour prior to  test. You may take your regular medications prior to the test.  Take metoprolol (Lopressor) two hours prior to test. If you take Furosemide/Hydrochlorothiazide/Spironolactone, please HOLD on the morning of the test.  After the Test: Drink plenty of water. After receiving IV contrast, you may experience a mild flushed feeling. This is normal. On occasion, you may experience a mild rash up to 24 hours after the test. This is not dangerous. If this occurs, you can take Benadryl 25 mg and increase your fluid intake. If you experience trouble breathing, this can be serious. If it is severe call 911 IMMEDIATELY. If it is mild, please call our office. If you take any of these medications: Glipizide/Metformin, Avandament, Glucavance, please do not take 48 hours after completing test unless otherwise instructed.  We will call to schedule your test 2-4 weeks out understanding that some insurance companies will need an authorization prior to the service being performed.  For non-scheduling related questions, please contact the cardiac imaging nurse navigator should you have any questions/concerns: Marchia Bond, Cardiac Imaging Nurse Navigator Gordy Clement, Cardiac Imaging Nurse Navigator [facility] Direct Office Dial: [phone]   For scheduling needs, including cancellations and rescheduling, please call [place], [phone].  Cardiac CT Angiogram A cardiac CT angiogram is a procedure to look at the heart and the area around the heart. It may be done to help find the cause of chest pains or other symptoms of heart disease. During this procedure, a substance called contrast dye is injected into a vein in the arm. The contrast highlights the blood vessels in the area to be checked. A large X-ray machine (CT scanner), then takes detailed pictures of the heart and the surrounding area. The procedure is also sometimes called a coronary CT angiogram, coronary artery  scanning, or CTA. A cardiac CT angiogram allows the health care provider to see how well blood is flowing to and from the heart. The provider will be able to see if there are any problems, such as: Blockage or narrowing of the arteries in the heart. Fluid around the heart. Signs of weakness or disease in the muscles, valves, and tissues of the heart. Tell a health care provider about: Any allergies you have. This is especially important if you have had a previous allergic reaction to medicines, contrast dye, or iodine. All medicines you are taking, including vitamins, herbs, eye drops, creams, and over-the-counter medicines. Any bleeding problems you have. Any surgeries you have had. Any medical conditions you have, including kidney problems or kidney failure. Whether you are pregnant or may be pregnant. Any anxiety disorders, chronic pain, or other conditions you have. These may increase your stress or prevent you from lying still. Any history of abnormal heart rhythms or heart procedures. What are the risks? Your provider will talk with you about risks. These may include: Bleeding. Infection. Allergic reactions to medicines or dyes. Damage to other structures or organs. Kidney damage from the contrast dye. Increased risk of cancer from radiation exposure. This risk is low. Talk with your provider about: The risks and benefits of testing. How you can receive the lowest dose of radiation. What happens before the procedure? Wear comfortable clothing and remove any jewelry, glasses, dentures, and hearing aids. Follow instructions from your provider about eating and drinking. These may include: 12 hours before the procedure Avoid caffeine. This includes tea, coffee, soda, energy drinks, and diet pills. Drink plenty of water or other fluids that do not have caffeine in them. Being well hydrated can prevent complications. 4-6 hours before the procedure Stop eating and drinking. This will  reduce the risk of nausea from the contrast dye. Ask your provider about changing or stopping your regular medicines. These include: Diabetes medicines. Medicines to treat problems with erections (erectile dysfunction). If you have kidney problems, you may need to receive IV hydration before and after the test. What happens during the procedure?  Hair on your chest may need to be removed so that small sticky patches called electrodes can be placed on your chest. These will transmit information that helps to monitor your heart during the procedure. An IV will be inserted into one of your veins. You might be given a medicine to control your heart rate during the procedure. This will help to ensure that good images are obtained. You will be asked to lie on an exam table. This table will slide in and out of  the CT machine during the procedure. Contrast dye will be injected into the IV. You might feel warm, or you may get a metallic taste in your mouth. You may be given medicines to relax or dilate the arteries in your heart. If you are allergic to contrast dyes or iodine you may be given medicine before the test to reduce the risk of an allergic reaction. The table that you are lying on will move into the CT machine tunnel for the scan. The person running the machine will give you instructions while the scans are being done. You may be asked to: Keep your arms above your head. Hold your breath for short periods. Stay very still, even if the table is moving. The procedure may vary among providers and hospitals. What can I expect after the procedure? After your procedure, it is common to have: A metallic taste in your mouth from the contrast dye. A feeling of warmth. A headache from the heart medicine. Follow these instructions at home: Take over-the-counter and prescription medicines only as told by your provider. If you are told, drink enough fluid to keep your pee pale yellow. This will help  to flush the contrast dye out of your body. Most people can return to their normal activities right after the procedure. Ask your provider what activities are safe for you. It is up to you to get the results of your procedure. Ask your provider, or the department that is doing the procedure, when your results will be ready. Contact a health care provider if: You have any symptoms of allergy to the contrast dye. These include: Shortness of breath. Rash or hives. A racing heartbeat. You notice a change in your peeing (urination). This information is not intended to replace advice given to you by your health care provider. Make sure you discuss any questions you have with your health care provider. Document Revised: 11/15/2021 Document Reviewed: 11/15/2021 Elsevier Patient Education  [place].    If you need a refill on your cardiac medications before your next appointment, please call your pharmacy.  I,Mathew Moore,acting as a Education administrator for Skeet Latch, MD.,have documented all relevant documentation on the behalf of Skeet Latch, MD,as directed by  Skeet Latch, MD while in the presence of Skeet Latch, MD.  I, [place] Linsey, MD have reviewed all documentation for this visit.  The documentation of the exam, diagnosis, procedures, and orders on 07/25/2022 are all accurate and complete.  Signed, Skeet Latch, MD  07/25/2022 3:03 PM    [facility]

## 2022-06-20 ENCOUNTER — Other Ambulatory Visit (HOSPITAL_COMMUNITY): Payer: Self-pay

## 2022-06-20 ENCOUNTER — Encounter (HOSPITAL_BASED_OUTPATIENT_CLINIC_OR_DEPARTMENT_OTHER): Payer: Self-pay

## 2022-06-24 ENCOUNTER — Ambulatory Visit: Payer: Medicare Other | Admitting: Physical Therapy

## 2022-06-24 ENCOUNTER — Encounter: Payer: Self-pay | Admitting: Physical Therapy

## 2022-06-24 ENCOUNTER — Telehealth: Payer: Self-pay | Admitting: Allergy and Immunology

## 2022-06-24 DIAGNOSIS — M25661 Stiffness of right knee, not elsewhere classified: Secondary | ICD-10-CM

## 2022-06-24 DIAGNOSIS — M25561 Pain in right knee: Secondary | ICD-10-CM | POA: Diagnosis not present

## 2022-06-24 DIAGNOSIS — R600 Localized edema: Secondary | ICD-10-CM

## 2022-06-24 DIAGNOSIS — R262 Difficulty in walking, not elsewhere classified: Secondary | ICD-10-CM | POA: Diagnosis not present

## 2022-06-24 NOTE — Therapy (Signed)
OUTPATIENT PHYSICAL THERAPY LOWER EXTREMITY TREATMENT    Patient Name: Arthur Moore MRN: [id number] DOB:[birth year], 72 y.o., male Today's Date: 06/24/2022   PT End of Session - 06/24/22 1541     Visit Number 33    Date for PT Re-Evaluation 07/02/22    Authorization Type Medicare    PT Start Time 1536    PT Stop Time 1620    PT Time Calculation (min) 44 min    Activity Tolerance Patient tolerated treatment well    Behavior During Therapy [facility] for tasks assessed/performed                Past Medical History:  Diagnosis Date   Aortic insufficiency    Arthritis    Asthma    Carotid bruit 07/09/2021   Cervical disc disease    Depression    Diastolic dysfunction A999333   Hyperlipidemia 08/24/2015   Hypertension    Lumbar disc disease    Psoriasis    Past Surgical History:  Procedure Laterality Date   CHOLECYSTECTOMY  2012   HERNIA REPAIR     LEFT HEART CATH AND CORONARY ANGIOGRAPHY N/A 07/08/2018   Procedure: LEFT HEART CATH AND CORONARY ANGIOGRAPHY;  Surgeon: Burnell Blanks, MD;  Location: [place] [facility];  Service: Cardiovascular;  Laterality: N/A;   PAROTIDECTOMY Right 1992   PROSTATE SURGERY  2002, 2004   [place]     SINUS EXPLORATION  2013   SPINE SURGERY  2013   L C7-T1 laminectomy and discectomy   TONSILLECTOMY     Patient Active Problem List   Diagnosis Date Noted   Snoring 02/14/2022   Mild sleep apnea 02/12/2022   Carotid bruit 07/09/2021   Aneurysm of ascending aorta ([place]) 06/28/2020   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   CAD (coronary artery disease) 07/09/2018   CKD (chronic kidney disease), 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   Chest pain of uncertain etiology [id number]   Chronic cough 06/21/2018   Thrombocytopenia ([place]) 12/01/2016   Transaminitis A999333   Diastolic dysfunction 0000000   MDD (major depressive disorder), recurrent severe, without psychosis ([place]) 10/13/2015   Hyperlipidemia 08/24/2015    Arthritis    Depression    Hypertension    Aortic insufficiency    Psoriasis    Cervical disc disease    Lumbar disc disease    Asthma 01/06/2015   Psoriatic arthritis ([place]) 01/06/2015   Allergic rhinoconjunctivitis 01/06/2015   GERD (gastroesophageal reflux disease) 01/06/2015   Laryngopharyngeal reflux (LPR) 01/06/2015    PCP: No PCP  REFERRING PROVIDER: Victorino December  REFERRING DIAG: Z47.89   THERAPY DIAG:  Acute pain of right knee  Localized edema  Stiffness of right knee, not elsewhere classified  Difficulty in walking, not elsewhere classified  Rationale for Evaluation and Treatment Rehabilitation  ONSET DATE: 12/26/21  SUBJECTIVE:   SUBJECTIVE STATEMENT: Doing well no pain, reports stiff and tight in the right lateral knee, occasionall will "give" when walking down a slope PERTINENT HISTORY: R meniscus repait post op 7 weeks  PAIN:  Are you having pain? Yes right knee 1/10, tight and ache PRECAUTIONS: None  WEIGHT BEARING RESTRICTIONS No  FALLS:  Has patient fallen in last 6 months? No  LIVING ENVIRONMENT: Lives with: lives with their spouse Lives in: House/apartment Stairs: Yes: Internal: 12 steps; on right going up Has following equipment at home: Single point cane and Walker - 2 wheeled  OCCUPATION: Retired  PLOF: Independent  PATIENT GOALS go out and play pickleball  OBJECTIVE:   PATIENT SURVEYS:  FOTO 35/100  COGNITION:  Overall cognitive status: Within functional limits for tasks assessed     SENSATION: WFL   MUSCLE LENGTH: Hamstrings: Right tightness limiting ROM   POSTURE: No Significant postural limitations and rounded shoulders  PALPATION: No TTP, some swelling in R knee   LOWER EXTREMITY ROM:  Active ROM Right eval 02/07/22 03/03/22 03/10/22 03/31/22 04/22/22  Hip flexion        Hip extension        Hip abduction        Hip adduction        Hip internal rotation        Hip external rotation        Knee  flexion 88 105 120 120 120 122  Knee extension -15 -'10 7 5 10 6  '$ Ankle dorsiflexion        Ankle plantarflexion        Ankle inversion        Ankle eversion         (Blank rows = not tested)  LOWER EXTREMITY MMT:  MMT Right eval Right 04/30/22  Hip flexion 4   Hip extension    Hip abduction    Hip adduction    Hip internal rotation    Hip external rotation    Knee flexion 4 5-  Knee extension 3+ w/pain 4+  Ankle dorsiflexion    Ankle plantarflexion    Ankle inversion    Ankle eversion     (Blank rows = not tested)  FUNCTIONAL TESTS:  5 times sit to stand: 18.89s Timed up and go (TUG): 17.53s  02/24/22 12 seconds   04/22/22 8 seconds  GAIT: Distance walked: 43f Assistive device utilized: WEnvironmental consultant- 2 wheeled and able to do TUG without AD  Level of assistance: Modified independence Comments: decreased stance time on RLE, decreased step length, antalgic gait, decreased R knee flexion,     TODAY'S TREATMENT: 06/24/22 Bike level 5 x 7 minutes 4 power bursts Marching and light bounce on minitramp Side stepping on airex and then tandem walk on airex Leg curls 35# both legs 2x10, right only 25# Volleyball 70# resisted gait all directions Leg press 60# x 10, and then with single leg 2x10 Tmill push 30 seconds x2 Lateral movements Started education on transition to the gym and being safe and how to go slow STM to the lateral right ITB distally and HS  06/17/22 Bike Level 5 x 7 minutes 3 power bursts 10# hip abduction, extension and flexion Leg press 60# x10, right only 40# 2x10 Gait around the building brisk pace Passive stretch to the HS and ITB Ktape to help unload the distal ITB  06/11/22 Bike level 5 x 6 minutes power burst x 2 Volleyball for lateral motions Gait outside fast pace uneven terrain Step ups 6" fwd, side snd cross overs 70# resisted gait all directions Single leg press 40# Passive stretch to the HS, ITB Ktape to decrease stress on the  ITB  06/09/22 Bike level 5 x 6 minutes power burst x 4 Volleyball Tmill push and pull 20s x 3 each LEg press 40# 2x10, then right only 2x10 Bridge with clamshell green tband STM to the right ITB and HS Ktape to the distal ITB  06/05/22 Fast walk around the building  Bike level 5 x 7 minutes 3 power bursts Volleyball working on lateral motions Side shuffling, direction changes quickly Passive stretch HS, ITB and piriformis STM to  the right HS and ITB  PATIENT EDUCATION:  Education details: POC Person educated: Patient Education method: Explanation Education comprehension: verbalized understanding   HOME EXERCISE PROGRAM: Access Code: [email or web address] URL: [email or web address] Date: 04/22/2022 Prepared by: Lum Babe  Exercises - Supine ITB Stretch with Strap  - 2 x daily - 7 x weekly - 1 sets - 5 reps - 30 hold - Standing ITB Stretch  - 2 x daily - 7 x weekly - 1 sets - 5 reps - 30 holdLAQ, SAQ, knee flexion seated- given from [place]:  CLINICAL IMPRESSION: Patient has not returned to the gym and has not tried pickleball, we discussed him doing something as we will d/c soon and he needs to transition back to doing things on his own and try to be safe and go slow REHAB POTENTIAL: Good  CLINICAL DECISION MAKING: Stable/uncomplicated  EVALUATION COMPLEXITY: Low   GOALS: Goals reviewed with patient? No  SHORT TERM GOALS: Target date: 03/12/22  Patient will be independent with initial HEP. Goal status:met   LONG TERM GOALS: Target date: 04/23/22  Patient will be independent with advanced/ongoing HEP to improve outcomes and carryover.  Goal status: progressing  2.  Patient will report at least 75% improvement in R knee pain to improve QOL. Baseline: 3/10 pain Goal status: partially met  3.  Patient will demonstrate improved R knee AROM to >/= 5-120 deg to allow for normal gait and stair mechanics. Baseline: 15-0-88 Goal status: met  at 5-120 degrees flexion  4.  Patient will demonstrate improved functional RLE strength as demonstrated by 5/5. Goal status: progressing  5.  Patient will be able to ambulate 600' with normal gait pattern without increased pain to access community.  Baseline: walking with RW Goal status: met  6. Patient will be able to ascend/descend stairs with 1 HR and reciprocal step pattern safely to access home and community.  Baseline: using cane to navigate stairs Goal status:  met  7.  Patient will report 26 on FOTO to demonstrate improved functional ability. Baseline: 35 at eval, 54 03/05/22 Goal status: met  PLAN: PT FREQUENCY: 2x/week  PT DURATION: 12 weeks  PLANNED INTERVENTIONS: Therapeutic exercises, Therapeutic activity, Neuromuscular re-education, Balance training, Gait training, Patient/Family education, Self Care, Joint mobilization, Stair training, Electrical stimulation, Cryotherapy, Taping, Vasopneumatic device, Ionotophoresis '4mg'$ /ml Dexamethasone, and Manual therapy  PLAN FOR NEXT SESSION: work on the education of him going to gym on his own safely, he has tried a few times but has not been consistent, biggest issue is pain and tightness Terris Germano W, PT 06/24/2022, 3:42 PM

## 2022-06-24 NOTE — Patient Instructions (Incomplete)
  1. Continue Symbicort 160/ 4.5 mcg 2 puffs twice a day with spacer For the next 2 weeks use budesonide (Pulmicort) twice a day via your nebulizer   2. Continue Flonase 1-2 sprays each nostril one time per day   3. Continue Protonix 40 mg 1-2 times per day  4. Continue the following if needed:   A. Zyrtec 10 mg - 1 tablet dailay  B. Albuterol HFA  C. Dermatology prescribed topical agent.   D. Do not use clobetasol on your genital region. Contact your dermatologist about other options   5. Be sure to keep your appointments for echocardiogram and cardiac CT If your symptoms worsen please go to the ER    Return to clinic in 2 weeks with Dr. Neldon Mc

## 2022-06-24 NOTE — Telephone Encounter (Signed)
Wife called in and states Arthur Moore is having trouble breathing and congestion.  Wife states for the last 2 months he has been having to use his rescue inhaler 2 to 3 times a week.  Wife also states Arthur Moore has been using his nebulizer more frequently and she says he hardly ever uses it unless he has to.  Wife called to make an appointment, and I made it for her, but I told her I would send a message to Dr. Neldon Mc as well to make him aware of what was going on with [facility].  Denies fever and denies cough.

## 2022-06-24 NOTE — Telephone Encounter (Signed)
Patient is scheduled with Chrissie at 4pm tomorrow 2/28.

## 2022-06-25 ENCOUNTER — Encounter: Payer: Self-pay | Admitting: Family

## 2022-06-25 ENCOUNTER — Ambulatory Visit (INDEPENDENT_AMBULATORY_CARE_PROVIDER_SITE_OTHER): Payer: Medicare Other | Admitting: Psychiatry

## 2022-06-25 ENCOUNTER — Ambulatory Visit (INDEPENDENT_AMBULATORY_CARE_PROVIDER_SITE_OTHER): Payer: Medicare Other | Admitting: Family

## 2022-06-25 VITALS — BP 124/80 | HR 76 | Temp 98.3°F

## 2022-06-25 DIAGNOSIS — J3089 Other allergic rhinitis: Secondary | ICD-10-CM | POA: Diagnosis not present

## 2022-06-25 DIAGNOSIS — F4310 Post-traumatic stress disorder, unspecified: Secondary | ICD-10-CM | POA: Diagnosis not present

## 2022-06-25 DIAGNOSIS — J4540 Moderate persistent asthma, uncomplicated: Secondary | ICD-10-CM | POA: Diagnosis not present

## 2022-06-25 DIAGNOSIS — L299 Pruritus, unspecified: Secondary | ICD-10-CM | POA: Diagnosis not present

## 2022-06-25 DIAGNOSIS — F411 Generalized anxiety disorder: Secondary | ICD-10-CM | POA: Diagnosis not present

## 2022-06-25 DIAGNOSIS — I25118 Atherosclerotic heart disease of native coronary artery with other forms of angina pectoris: Secondary | ICD-10-CM

## 2022-06-25 DIAGNOSIS — K219 Gastro-esophageal reflux disease without esophagitis: Secondary | ICD-10-CM | POA: Diagnosis not present

## 2022-06-25 DIAGNOSIS — F331 Major depressive disorder, recurrent, moderate: Secondary | ICD-10-CM | POA: Diagnosis not present
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
## 2022-06-25 NOTE — Progress Notes (Signed)
[name] [id number] Dept: [phone]  FOLLOW UP NOTE  Patient ID: Name Arthur Moore, male    DOB: Mar 10, 1951  Age: 72 y.o. MRN: [id number] Date of Office Visit: 06/25/2022  Assessment  Chief Complaint: Asthma (Using albuterol more often than normal)  HPI Arthur Moore is a 72 year old male who presents today for an acute visit of using his albuterol more than normal.  He was last seen on December 10, 2021 by Dr. Neldon Mc for moderate persistent asthma, allergic rhinitis, laryngopharyngeal disease, and pruritic disorder.  His wife is here with him today and helps provide history.  Since his last office visit he reports that he had knee surgery in August 2023 and is gained 20 pounds.  Moderate persistent asthma: He reports a couple months ago he noticed changes in his breathing.  He reports shortness of breath, wheezing when short of breath, tightness in his chest, and nocturnal awakenings sometimes due to breathing problems.  He is not certain if it is his asthma or if it his heart.  He reports that he has a leaky mitral valve  and blockage in his heart.  He does have a cardiac CT scheduled for June 30, 2002 and is also scheduled for an echocardiogram soon.  He is currently using Symbicort 160/4.5 mcg 2 puffs twice a day, budesonide via his nebulizer that came from the [place] as needed, and albuterol as needed.  He is not using Breztri because the [place] would not pay for it.  He also reports that he is using his albuterol inhaler more frequently.  He reports that 1 time he used it 2 times in 1 day and then at times it may be two or more times in 1 week.  He does feel like the albuterol helps "somewhat.".  What he feels like really works is the budesonide via his nebulizer.  He did use the budesonide in his nebulizer once today and once yesterday.  He does report a history of enlarged prostate and mentions that he goes to the bathroom 4 times at night.  He has not made any trips to the  emergency room or urgent care due to breathing problems.  He has not received any steroids for his asthma, but did receive prednisone by mouth in December for his neck and a steroid injection for his neck.  He has also had steroid for his right knee.  Allergic rhinitis: He reports nasal congestion and he denies rhinorrhea and postnasal drip.  He has maybe had 1 sinus infection where he received a Z-Pak since his last office visit.  He does use Flonase as needed and takes Zyrtec 10 mg once a day.  Laryngopharyngeal reflux disease: He denies heartburn or reflux symptoms while taking Protonix 40 mg once a day.  He does feel like his reflux is very well-controlled.  Pruritic disorder: He continues to take Zyrtec 10 mg once a day as mentioned that the clobetasol works well.  He has noticed that he has had itching on his testicles and used  clobetasol and it has helped.  Discussed that this is way too strong of a medication to use on his testicles.  He does not ever see a rash when he has itching.   Drug Allergies:  Allergies  Allergen Reactions   Atorvastatin Other (See Comments)    Calf pain. Other reaction(s): leb numbness   Zolpidem Other (See Comments)    Other reaction(s): Drowsy    Review of Systems: Review of  Systems  Constitutional:  Negative for chills and fever.  HENT:         Reports nasal congestion and sneezing. Denies rhinorrhea and post nasal drip  Eyes:        Denies itchy watery eyes  Respiratory:         Reports shortness of breath, wheezing when short of breath, tightness in chest , and nocturnal awakenings due to breathing problems  Cardiovascular:  Negative for chest pain and palpitations.  Gastrointestinal:        Denies heartburn or reflux symptoms on Protonix once a day  Genitourinary:  Positive for frequency.       Reports urinating 4 times at night. Reports enlarged prostate  Skin:  Positive for itching.       Report itching on his testicles  Neurological:   Negative for headaches.     Physical Exam: BP 124/80   Pulse 76   Temp 98.3 F (36.8 C)   SpO2 96%    Physical Exam Exam conducted with a chaperone present.  Constitutional:      Appearance: Normal appearance.  HENT:     Head: Normocephalic and atraumatic.     Comments: Pharynx normal. Eyes normal. Ears normal. Nose normal    Right Ear: Tympanic membrane, ear canal and external ear normal.     Left Ear: Tympanic membrane, ear canal and external ear normal.     Nose: Nose normal.     Mouth/Throat:     Mouth: Mucous membranes are moist.     Pharynx: Oropharynx is clear.  Eyes:     Conjunctiva/sclera: Conjunctivae normal.  Cardiovascular:     Rate and Rhythm: Normal rate and regular rhythm.     Heart sounds: Normal heart sounds.  Pulmonary:     Effort: Pulmonary effort is normal.     Breath sounds: Normal breath sounds.     Comments: Lungs clear to auscultation Musculoskeletal:     Cervical back: Neck supple.  Skin:    General: Skin is warm.  Neurological:     Mental Status: He is alert and oriented to person, place, and time.  Psychiatric:        Mood and Affect: Mood normal.        Behavior: Behavior normal.        Thought Content: Thought content normal.        Judgment: Judgment normal.     Diagnostics: FVC 3.41 L (78%), FEV1 2.57 L (78%).  Spirometry indicates normal spirometry.  After 4 puffs of Xopenex FVC 3.35 L (76%), FEV1 2.64 L (80%).  There is only a 2% change in FEV1.  Spirometry indicates normal spirometry.  Assessment and Plan: 1. Not well controlled moderate persistent asthma   2. Other allergic rhinitis   3. LPRD (laryngopharyngeal reflux disease)   4. Pruritic disorder     No orders of the defined types were placed in this encounter.   Patient Instructions   1. Continue Symbicort 160/ 4.5 mcg 2 puffs twice a day with spacer For the next 2 weeks add on budesonide (Pulmicort) twice a day via your nebulizer   2. Continue Flonase 1-2 sprays  each nostril one time per day   3. Continue Protonix 40 mg 1-2 times per day  4. Continue the following if needed:   A. Zyrtec 10 mg - 1 tablet dailay  B. Albuterol HFA  C. Dermatology prescribed topical agent.   D. Do not use clobetasol on your genital region. Contact  your dermatologist about other options   5. Be sure to keep your appointments for echocardiogram and cardiac CT If your symptoms worsen please go to the ER    Return to clinic in 2 weeks with Dr. Neldon Mc  Return in about 2 weeks (around 07/09/2022), or if symptoms worsen or fail to improve.    Thank you for the opportunity to care for this patient.  Please do not hesitate to contact me with questions.  Althea Charon, FNP [facility]

## 2022-06-26 ENCOUNTER — Ambulatory Visit: Payer: Medicare Other | Admitting: Physical Therapy

## 2022-06-26 ENCOUNTER — Other Ambulatory Visit (HOSPITAL_COMMUNITY): Payer: Self-pay

## 2022-06-26 ENCOUNTER — Encounter: Payer: Self-pay | Admitting: Physical Therapy

## 2022-06-26 DIAGNOSIS — M25661 Stiffness of right knee, not elsewhere classified: Secondary | ICD-10-CM | POA: Diagnosis not present

## 2022-06-26 DIAGNOSIS — M25561 Pain in right knee: Secondary | ICD-10-CM

## 2022-06-26 DIAGNOSIS — R262 Difficulty in walking, not elsewhere classified: Secondary | ICD-10-CM

## 2022-06-26 DIAGNOSIS — R600 Localized edema: Secondary | ICD-10-CM | POA: Diagnosis not present

## 2022-06-26 NOTE — Therapy (Signed)
OUTPATIENT PHYSICAL THERAPY LOWER EXTREMITY TREATMENT    Patient Name: Arthur Moore MRN: [id number] DOB:Sep 11, 1950, 72 y.o., male Today's Date: 06/26/2022   PT End of Session - 06/26/22 1536     Visit Number 34    Date for PT Re-Evaluation 07/02/22    Authorization Type Medicare    PT Start Time 1529    PT Stop Time 1618    PT Time Calculation (min) 49 min    Activity Tolerance Patient tolerated treatment well    Behavior During Therapy [facility] for tasks assessed/performed                Past Medical History:  Diagnosis Date   Aortic insufficiency    Arthritis    Asthma    Carotid bruit 07/09/2021   Cervical disc disease    Depression    Diastolic dysfunction A999333   Hyperlipidemia 08/24/2015   Hypertension    Lumbar disc disease    Psoriasis    Past Surgical History:  Procedure Laterality Date   CHOLECYSTECTOMY  2012   HERNIA REPAIR     KNEE ARTHROSCOPY W/ MENISCAL REPAIR  11/2021   LEFT HEART CATH AND CORONARY ANGIOGRAPHY N/A 07/08/2018   Procedure: LEFT HEART CATH AND CORONARY ANGIOGRAPHY;  Surgeon: Arthur Blanks, MD;  Location: [facility];  Service: Cardiovascular;  Laterality: N/A;   PAROTIDECTOMY Right 1992   PROSTATE SURGERY  2002, 2004   Arthur Moore     SINUS EXPLORATION  2013   SPINE SURGERY  2013   L C7-T1 laminectomy and discectomy   TONSILLECTOMY     Patient Active Problem List   Diagnosis Date Noted   Snoring 02/14/2022   Mild sleep apnea 02/12/2022   Carotid bruit 07/09/2021   Aneurysm of ascending aorta ([place]) 06/28/2020   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   CAD (coronary artery disease) 07/09/2018   CKD (chronic kidney disease), 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   Chest pain of uncertain etiology A999333   Chronic cough 06/21/2018   Thrombocytopenia ([place]) 12/01/2016   Transaminitis A999333   Diastolic dysfunction 0000000   MDD (major depressive disorder), recurrent severe, without  psychosis ([place]) 10/13/2015   Hyperlipidemia 08/24/2015   Arthritis    Depression    Hypertension    Aortic insufficiency    Psoriasis    Cervical disc disease    Lumbar disc disease    Asthma 01/06/2015   Psoriatic arthritis ([place]) 01/06/2015   Allergic rhinoconjunctivitis 01/06/2015   GERD (gastroesophageal reflux disease) 01/06/2015   Laryngopharyngeal reflux (LPR) 01/06/2015    PCP: No PCP  REFERRING PROVIDER: Victorino Moore  REFERRING DIAG: Z47.89   THERAPY DIAG:  Acute pain of right knee  Localized edema  Stiffness of right knee, not elsewhere classified  Difficulty in walking, not elsewhere classified  Rationale for Evaluation and Treatment Rehabilitation  ONSET DATE: 12/26/21  SUBJECTIVE:   SUBJECTIVE STATEMENT: Doing well no pain, reports stiff and tight in the right lateral knee, occasionall will "give" when walking down a slope PERTINENT HISTORY: R meniscus repait post op 7 weeks  PAIN:  Are you having pain? Yes right knee 1/10, tight and ache PRECAUTIONS: None  WEIGHT BEARING RESTRICTIONS No  FALLS:  Has patient fallen in last 6 months? No  LIVING ENVIRONMENT: Lives with: lives with their spouse Lives in: House/apartment Stairs: Yes: Internal: 12 steps; on right going up Has following equipment at home: Single point cane and Walker - 2 wheeled  OCCUPATION: Retired  PLOF: Independent  PATIENT GOALS go out and play pickleball    OBJECTIVE:   PATIENT SURVEYS:  FOTO 35/100  COGNITION:  Overall cognitive status: Within functional limits for tasks assessed     SENSATION: WFL   MUSCLE LENGTH: Hamstrings: Right tightness limiting ROM   POSTURE: No Significant postural limitations and rounded shoulders  PALPATION: No TTP, some swelling in R knee   LOWER EXTREMITY ROM:  Active ROM Right eval 02/07/22 03/03/22 03/10/22 03/31/22 04/22/22  Hip flexion        Hip extension        Hip abduction        Hip adduction        Hip internal  rotation        Hip external rotation        Knee flexion 88 105 120 120 120 122  Knee extension -15 -'10 7 5 10 6  '$ Ankle dorsiflexion        Ankle plantarflexion        Ankle inversion        Ankle eversion         (Blank rows = not tested)  LOWER EXTREMITY MMT:  MMT Right eval Right 04/30/22  Hip flexion 4   Hip extension    Hip abduction    Hip adduction    Hip internal rotation    Hip external rotation    Knee flexion 4 5-  Knee extension 3+ w/pain 4+  Ankle dorsiflexion    Ankle plantarflexion    Ankle inversion    Ankle eversion     (Blank rows = not tested)  FUNCTIONAL TESTS:  5 times sit to stand: 18.89s Timed up and go (TUG): 17.53s  02/24/22 12 seconds   04/22/22 8 seconds  GAIT: Distance walked: 2f Assistive device utilized: WEnvironmental consultant- 2 wheeled and able to do TUG without AD  Level of assistance: Modified independence Comments: decreased stance time on RLE, decreased step length, antalgic gait, decreased R knee flexion,     TODAY'S TREATMENT: 06/26/22 Bike level 5 power bursts highest 751 watts Gait around building fast pace, light jog 100 feet Minitramp march and light bounce 15# straight arm pull for core 10# hip extension and abduction Tandem walk ont he airex balance beam Leg press 60# x10, right only 2x10 Volleyball again getting him to move laterally and fwd and back STM to the right ITB and HS  06/24/22 Bike level 5 x 7 minutes 4 power bursts Marching and light bounce on minitramp Side stepping on airex and then tandem walk on airex Leg curls 35# both legs 2x10, right only 25# Volleyball 70# resisted gait all directions Leg press 60# x 10, and then with single leg 2x10 Tmill push 30 seconds x2 Lateral movements Started education on transition to the gym and being safe and how to go slow STM to the lateral right ITB distally and HS  06/17/22 Bike Level 5 x 7 minutes 3 power bursts 10# hip abduction, extension and flexion Leg press 60#  x10, right only 40# 2x10 Gait around the building brisk pace Passive stretch to the HS and ITB Ktape to help unload the distal ITB  06/11/22 Bike level 5 x 6 minutes power burst x 2 Volleyball for lateral motions Gait outside fast pace uneven terrain Step ups 6" fwd, side snd cross overs 70# resisted gait all directions Single leg press 40# Passive stretch to the HS, ITB Ktape to decrease stress on the ITB  06/09/22 Bike level  5 x 6 minutes power burst x 4 Volleyball Tmill push and pull 20s x 3 each LEg press 40# 2x10, then right only 2x10 Bridge with clamshell green tband STM to the right ITB and HS Ktape to the distal ITB  06/05/22 Fast walk around the building  Bike level 5 x 7 minutes 3 power bursts Volleyball working on lateral motions Side shuffling, direction changes quickly Passive stretch HS, ITB and piriformis STM to the right HS and ITB  PATIENT EDUCATION:  Education details: POC Person educated: Patient Education method: Explanation Education comprehension: verbalized understanding   HOME EXERCISE PROGRAM: Access Code: [email or web address] URL: [email or web address] Date: 04/22/2022 Prepared by: Lum Babe  Exercises - Supine ITB Stretch with Strap  - 2 x daily - 7 x weekly - 1 sets - 5 reps - 30 hold - Standing ITB Stretch  - 2 x daily - 7 x weekly - 1 sets - 5 reps - 30 holdLAQ, SAQ, knee flexion seated- given from [place]:  CLINICAL IMPRESSION: Patient has not returned to the gym and has not tried pickleball, we discussed him doing something as we will d/c soon and he needs to transition back to doing things on his own and try to be safe and go slow.  WE continue to do this and we are really trying to push him to show that he is doing well and will be able to do this on his own REHAB POTENTIAL: Good  CLINICAL DECISION MAKING: Stable/uncomplicated  EVALUATION COMPLEXITY: Low   GOALS: Goals reviewed with patient?  No  SHORT TERM GOALS: Target date: 03/12/22  Patient will be independent with initial HEP. Goal status:met   LONG TERM GOALS: Target date: 04/23/22  Patient will be independent with advanced/ongoing HEP to improve outcomes and carryover.  Goal status: progressing  2.  Patient will report at least 75% improvement in R knee pain to improve QOL. Baseline: 3/10 pain Goal status: met 06/26/22  3.  Patient will demonstrate improved R knee AROM to >/= 5-120 deg to allow for normal gait and stair mechanics. Baseline: 15-0-88 Goal status: met at 5-120 degrees flexion  4.  Patient will demonstrate improved functional RLE strength as demonstrated by 5/5. Goal status: met 06/26/22  5.  Patient will be able to ambulate 600' with normal gait pattern without increased pain to access community.  Baseline: walking with RW Goal status: met  6. Patient will be able to ascend/descend stairs with 1 HR and reciprocal step pattern safely to access home and community.  Baseline: using cane to navigate stairs Goal status:  met  7.  Patient will report 32 on FOTO to demonstrate improved functional ability. Baseline: 35 at eval, 54 03/05/22 Goal status: met  PLAN: PT FREQUENCY: 2x/week  PT DURATION: 12 weeks  PLANNED INTERVENTIONS: Therapeutic exercises, Therapeutic activity, Neuromuscular re-education, Balance training, Gait training, Patient/Family education, Self Care, Joint mobilization, Stair training, Electrical stimulation, Cryotherapy, Taping, Vasopneumatic device, Ionotophoresis '4mg'$ /ml Dexamethasone, and Manual therapy  PLAN FOR NEXT SESSION: work on the education of him going to gym on his own safely, he has tried a few times but has not been consistent, biggest issue is pain and tightness Janaya Broy W, PT 06/26/2022, 3:37 PM

## 2022-06-27 ENCOUNTER — Telehealth (HOSPITAL_COMMUNITY): Payer: Self-pay | Admitting: *Deleted

## 2022-06-27 NOTE — Telephone Encounter (Signed)
Reaching out to patient to offer assistance regarding upcoming cardiac imaging study; pt verbalizes understanding of appt date/time, parking situation and where to check in, pre-test NPO status and medications ordered, and verified current allergies; name and call back number provided for further questions should they arise  Gordy Clement RN Navigator [facility] [phone] office [phone] cell  Patient confirms HR is about 65bpm since taking 12.'5mg'$  metoprolol tartrate BID. Pt to take '25mg'$  two hours prior to his cardiac CT scan. He is aware to arrive at 2:30pm.

## 2022-06-30 ENCOUNTER — Ambulatory Visit (HOSPITAL_BASED_OUTPATIENT_CLINIC_OR_DEPARTMENT_OTHER)
Admission: RE | Admit: 2022-06-30 | Discharge: 2022-06-30 | Disposition: A | Discharge status: Home / Self Care | Payer: Medicare Other | Source: Ambulatory Visit | Attending: Internal Medicine | Admitting: Internal Medicine

## 2022-06-30 ENCOUNTER — Other Ambulatory Visit: Payer: Self-pay | Admitting: Internal Medicine

## 2022-06-30 ENCOUNTER — Ambulatory Visit (HOSPITAL_COMMUNITY)
Admission: RE | Admit: 2022-06-30 | Discharge: 2022-06-30 | Disposition: A | Discharge status: Home / Self Care | Payer: Medicare Other | Source: Ambulatory Visit | Attending: Cardiovascular Disease | Admitting: Cardiovascular Disease

## 2022-06-30 DIAGNOSIS — R0609 Other forms of dyspnea: Secondary | ICD-10-CM | POA: Diagnosis not present

## 2022-06-30 DIAGNOSIS — R931 Abnormal findings on diagnostic imaging of heart and coronary circulation: Secondary | ICD-10-CM | POA: Insufficient documentation

## 2022-06-30 DIAGNOSIS — I25118 Atherosclerotic heart disease of native coronary artery with other forms of angina pectoris: Secondary | ICD-10-CM | POA: Diagnosis not present

## 2022-06-30 DIAGNOSIS — E785 Hyperlipidemia, unspecified: Secondary | ICD-10-CM | POA: Diagnosis not present

## 2022-06-30 DIAGNOSIS — I10 Essential (primary) hypertension: Secondary | ICD-10-CM | POA: Diagnosis not present

## 2022-06-30 MED ORDER — IOHEXOL 350 MG/ML SOLN
95.0000 mL | Freq: Once | INTRAVENOUS | Status: AC | PRN
  Administered 2022-06-30: 95 mL via INTRAVENOUS

## 2022-06-30 MED ORDER — METOPROLOL TARTRATE 5 MG/5ML IV SOLN: 10.0000 mg | Freq: Once | INTRAVENOUS | Status: AC

## 2022-06-30 MED ORDER — NITROGLYCERIN 0.4 MG SL SUBL: 0.8000 mg | SUBLINGUAL_TABLET | Freq: Once | SUBLINGUAL | Status: AC

## 2022-06-30 MED ORDER — METOPROLOL TARTRATE 5 MG/5ML IV SOLN
INTRAVENOUS | Status: AC
  Administered 2022-06-30: 10 mg via INTRAVENOUS
  Filled 2022-06-30: qty 10

## 2022-06-30 MED ORDER — NITROGLYCERIN 0.4 MG SL SUBL
SUBLINGUAL_TABLET | SUBLINGUAL | Status: AC
  Administered 2022-06-30: 0.8 mg via SUBLINGUAL
  Filled 2022-06-30: qty 2

## 2022-07-01 ENCOUNTER — Encounter: Payer: Self-pay | Admitting: Physical Therapy

## 2022-07-01 ENCOUNTER — Encounter (HOSPITAL_BASED_OUTPATIENT_CLINIC_OR_DEPARTMENT_OTHER): Payer: Self-pay | Admitting: Cardiovascular Disease

## 2022-07-01 ENCOUNTER — Ambulatory Visit: Payer: Medicare Other | Admitting: Allergy and Immunology

## 2022-07-01 ENCOUNTER — Ambulatory Visit: Payer: Medicare Other | Attending: Orthopedic Surgery | Admitting: Physical Therapy

## 2022-07-01 DIAGNOSIS — R262 Difficulty in walking, not elsewhere classified: Secondary | ICD-10-CM | POA: Diagnosis not present

## 2022-07-01 DIAGNOSIS — R600 Localized edema: Secondary | ICD-10-CM | POA: Diagnosis not present

## 2022-07-01 DIAGNOSIS — M25661 Stiffness of right knee, not elsewhere classified: Secondary | ICD-10-CM | POA: Insufficient documentation

## 2022-07-01 DIAGNOSIS — M25561 Pain in right knee: Secondary | ICD-10-CM | POA: Insufficient documentation

## 2022-07-01 NOTE — Telephone Encounter (Signed)
Pt has follow up questions

## 2022-07-01 NOTE — Therapy (Signed)
OUTPATIENT PHYSICAL THERAPY LOWER EXTREMITY TREATMENT    Patient Name: Arthur Moore MRN: [id number] DOB:February 24, 1951, 72 y.o., male Today's Date: 07/01/2022   PT End of Session - 07/01/22 1537     Visit Number 35    Authorization Type Medicare    PT Start Time [id number]    PT Stop Time [id number]    PT Time Calculation (min) 47 min    Activity Tolerance Patient tolerated treatment well    Behavior During Therapy [facility] for tasks assessed/performed                Past Medical History:  Diagnosis Date   Aortic insufficiency    Arthritis    Asthma    Carotid bruit 07/09/2021   Cervical disc disease    Depression    Diastolic dysfunction A999333   Hyperlipidemia 08/24/2015   Hypertension    Lumbar disc disease    Psoriasis    Past Surgical History:  Procedure Laterality Date   CHOLECYSTECTOMY  2012   HERNIA REPAIR     KNEE ARTHROSCOPY W/ MENISCAL REPAIR  11/2021   LEFT HEART CATH AND CORONARY ANGIOGRAPHY N/A 07/08/2018   Procedure: LEFT HEART CATH AND CORONARY ANGIOGRAPHY;  Surgeon: Burnell Blanks, MD;  Location: [place] [facility];  Service: Cardiovascular;  Laterality: N/A;   PAROTIDECTOMY Right 1992   PROSTATE SURGERY  2002, 2004   [place]     SINUS EXPLORATION  2013   SPINE SURGERY  2013   L C7-T1 laminectomy and discectomy   TONSILLECTOMY     Patient Active Problem List   Diagnosis Date Noted   Snoring 02/14/2022   Mild sleep apnea 02/12/2022   Carotid bruit 07/09/2021   Aneurysm of ascending aorta ([place]) 06/28/2020   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   CAD (coronary artery disease) 07/09/2018   CKD (chronic kidney disease), 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   Chest pain of uncertain etiology A999333   Chronic cough 06/21/2018   Thrombocytopenia ([place]) 12/01/2016   Transaminitis A999333   Diastolic dysfunction 0000000   MDD (major depressive disorder), recurrent severe, without psychosis ([place]) 10/13/2015   Hyperlipidemia  08/24/2015   Arthritis    Depression    Hypertension    Aortic insufficiency    Psoriasis    Cervical disc disease    Lumbar disc disease    Asthma 01/06/2015   Psoriatic arthritis ([place]) 01/06/2015   Allergic rhinoconjunctivitis 01/06/2015   GERD (gastroesophageal reflux disease) 01/06/2015   Laryngopharyngeal reflux (LPR) 01/06/2015    PCP: No PCP  REFERRING PROVIDER: Victorino December  REFERRING DIAG: Z47.89   THERAPY DIAG:  Acute pain of right knee  Localized edema  Stiffness of right knee, not elsewhere classified  Difficulty in walking, not elsewhere classified  Rationale for Evaluation and Treatment Rehabilitation  ONSET DATE: 12/26/21  SUBJECTIVE:   SUBJECTIVE STATEMENT: Patient played pickleball without any big issues, he had some tightness and soreness in the legs but really felt okay PERTINENT HISTORY: R meniscus repait post op 7 weeks  PAIN:  Are you having pain? Yes right knee 1/10, tight and ache PRECAUTIONS: None  WEIGHT BEARING RESTRICTIONS No  FALLS:  Has patient fallen in last 6 months? No  LIVING ENVIRONMENT: Lives with: lives with their spouse Lives in: House/apartment Stairs: Yes: Internal: 12 steps; on right going up Has following equipment at home: Single point cane and Walker - 2 wheeled  OCCUPATION: Retired  PLOF: Independent  PATIENT GOALS go out and play pickleball  OBJECTIVE:   PATIENT SURVEYS:  FOTO 35/100  COGNITION:  Overall cognitive status: Within functional limits for tasks assessed     SENSATION: WFL   MUSCLE LENGTH: Hamstrings: Right tightness limiting ROM   POSTURE: No Significant postural limitations and rounded shoulders  PALPATION: No TTP, some swelling in R knee   LOWER EXTREMITY ROM:  Active ROM Right eval 02/07/22 03/03/22 03/10/22 03/31/22 04/22/22  Hip flexion        Hip extension        Hip abduction        Hip adduction        Hip internal rotation        Hip external rotation         Knee flexion 88 105 120 120 120 122  Knee extension -15 -'10 7 5 10 6  '$ Ankle dorsiflexion        Ankle plantarflexion        Ankle inversion        Ankle eversion         (Blank rows = not tested)  LOWER EXTREMITY MMT:  MMT Right eval Right 04/30/22  Hip flexion 4   Hip extension    Hip abduction    Hip adduction    Hip internal rotation    Hip external rotation    Knee flexion 4 5-  Knee extension 3+ w/pain 4+  Ankle dorsiflexion    Ankle plantarflexion    Ankle inversion    Ankle eversion     (Blank rows = not tested)  FUNCTIONAL TESTS:  5 times sit to stand: 18.89s Timed up and go (TUG): 17.53s  02/24/22 12 seconds   04/22/22 8 seconds  GAIT: Distance walked: 47f Assistive device utilized: WEnvironmental consultant- 2 wheeled and able to do TUG without AD  Level of assistance: Modified independence Comments: decreased stance time on RLE, decreased step length, antalgic gait, decreased R knee flexion,     TODAY'S TREATMENT: 07/01/22 Gait outside around the back building  Bike Level 5 x 6 mintues power burst up to 799 Volleyball with lateral and fwd movements Voilleyball on airex Side step on and off airex Passive stretch to the LE's Reviewed HEP, stretches, ice, tape, brace and the resumption of sports and gym safely  06/26/22 Bike level 5 power bursts highest 751 watts Gait around building fast pace, light jog 100 feet Minitramp march and light bounce 15# straight arm pull for core 10# hip extension and abduction Tandem walk ont he airex balance beam Leg press 60# x10, right only 2x10 Volleyball again getting him to move laterally and fwd and back STM to the right ITB and HS  06/24/22 Bike level 5 x 7 minutes 4 power bursts Marching and light bounce on minitramp Side stepping on airex and then tandem walk on airex Leg curls 35# both legs 2x10, right only 25# Volleyball 70# resisted gait all directions Leg press 60# x 10, and then with single leg 2x10 Tmill push 30  seconds x2 Lateral movements Started education on transition to the gym and being safe and how to go slow STM to the lateral right ITB distally and HS  06/17/22 Bike Level 5 x 7 minutes 3 power bursts 10# hip abduction, extension and flexion Leg press 60# x10, right only 40# 2x10 Gait around the building brisk pace Passive stretch to the HS and ITB Ktape to help unload the distal ITB  06/11/22 Bike level 5 x 6 minutes power burst x 2  Volleyball for lateral motions Gait outside fast pace uneven terrain Step ups 6" fwd, side snd cross overs 70# resisted gait all directions Single leg press 40# Passive stretch to the HS, ITB Ktape to decrease stress on the ITB  06/09/22 Bike level 5 x 6 minutes power burst x 4 Volleyball Tmill push and pull 20s x 3 each LEg press 40# 2x10, then right only 2x10 Bridge with clamshell green tband STM to the right ITB and HS Ktape to the distal ITB  06/05/22 Fast walk around the building  Bike level 5 x 7 minutes 3 power bursts Volleyball working on lateral motions Side shuffling, direction changes quickly Passive stretch HS, ITB and piriformis STM to the right HS and ITB  PATIENT EDUCATION:  Education details: POC Person educated: Patient Education method: Explanation Education comprehension: verbalized understanding   HOME EXERCISE PROGRAM: Access Code: [email or web address] URL: [email or web address] Date: 04/22/2022 Prepared by: Lum Babe  Exercises - Supine ITB Stretch with Strap  - 2 x daily - 7 x weekly - 1 sets - 5 reps - 30 hold - Standing ITB Stretch  - 2 x daily - 7 x weekly - 1 sets - 5 reps - 30 holdLAQ, SAQ, knee flexion seated- given from [place]:  CLINICAL IMPRESSION: Patient AROM 0-125 degrees flexion, mild tightness posterior knee, he does c/o some lateral distal ITB tightness as well.  He has met all goals and was able to play pickleball this past weekend without any setback REHAB POTENTIAL:  Good  CLINICAL DECISION MAKING: Stable/uncomplicated  EVALUATION COMPLEXITY: Low   GOALS: Goals reviewed with patient? No  SHORT TERM GOALS: Target date: 03/12/22  Patient will be independent with initial HEP. Goal status:met   LONG TERM GOALS: Target date: 04/23/22  Patient will be independent with advanced/ongoing HEP to improve outcomes and carryover.  Goal status:met  2.  Patient will report at least 75% improvement in R knee pain to improve QOL. Baseline: 3/10 pain Goal status: met 06/26/22  3.  Patient will demonstrate improved R knee AROM to >/= 5-120 deg to allow for normal gait and stair mechanics. Baseline: 15-0-88 Goal status: met at 5-120 degrees flexion  4.  Patient will demonstrate improved functional RLE strength as demonstrated by 5/5. Goal status: met 06/26/22  5.  Patient will be able to ambulate 600' with normal gait pattern without increased pain to access community.  Baseline: walking with RW Goal status: met  6. Patient will be able to ascend/descend stairs with 1 HR and reciprocal step pattern safely to access home and community.  Baseline: using cane to navigate stairs Goal status:  met  7.  Patient will report 41 on FOTO to demonstrate improved functional ability. Baseline: 35 at eval, 54 03/05/22 Goal status: met  PLAN: PT FREQUENCY: 2x/week  PT DURATION: 12 weeks  PLANNED INTERVENTIONS: Therapeutic exercises, Therapeutic activity, Neuromuscular re-education, Balance training, Gait training, Patient/Family education, Self Care, Joint mobilization, Stair training, Electrical stimulation, Cryotherapy, Taping, Vasopneumatic device, Ionotophoresis '4mg'$ /ml Dexamethasone, and Manual therapy  PLAN FOR NEXT SESSION: d/c goals met Sumner Boast, PT 07/01/2022, 3:38 PM

## 2022-07-01 NOTE — Telephone Encounter (Signed)
Recommend office visit after echo results available.   Cardiac CT shows moderate nonobstructive disease. FFR (which measured flow) showed no significant reduction in blood flow throughout the heart. No need for stent or intervention at this time based on those results. Recommend secondary prevention.  For coronary artery disease often called "heart disease" we aim for optimal guideline directed medical therapy. We use the "A, B, C"s to help keep [place] on track!  A = Aspirin '81mg'$  daily B = Beta blocker which helps to relax the heart. This is Metoprolol. C = Cholesterol control. Rosuvastatin to help control your cholesterol. (Most recent LDL of 47 which is at goal of <55) D = Don't forget nitroglycerin! This is an emergency tablet to be used for chest pain.

## 2022-07-02 ENCOUNTER — Ambulatory Visit: Payer: Medicare Other | Admitting: Allergy

## 2022-07-02 DIAGNOSIS — I10 Essential (primary) hypertension: Secondary | ICD-10-CM | POA: Diagnosis not present

## 2022-07-02 DIAGNOSIS — Z01812 Encounter for preprocedural laboratory examination: Secondary | ICD-10-CM | POA: Diagnosis not present

## 2022-07-02 DIAGNOSIS — R0609 Other forms of dyspnea: Secondary | ICD-10-CM | POA: Diagnosis not present

## 2022-07-02 LAB — BASIC METABOLIC PANEL
BUN/Creatinine Ratio: 14 (ref 10–24)
BUN: 17 mg/dL (ref 8–27)
CO2: 20 mmol/L (ref 20–29)
Calcium: 9.1 mg/dL (ref 8.6–10.2)
Chloride: 108 mmol/L — ABNORMAL HIGH (ref 96–106)
Creatinine, Ser: 1.25 mg/dL (ref 0.76–1.27)
Glucose: 153 mg/dL — ABNORMAL HIGH (ref 70–99)
Potassium: 4.2 mmol/L (ref 3.5–5.2)
Sodium: 143 mmol/L (ref 134–144)
eGFR: 62 mL/min/{1.73_m2} (ref >59)

## 2022-07-03 DIAGNOSIS — B078 Other viral warts: Secondary | ICD-10-CM | POA: Diagnosis not present

## 2022-07-08 ENCOUNTER — Encounter: Payer: Self-pay | Admitting: Allergy and Immunology

## 2022-07-08 ENCOUNTER — Other Ambulatory Visit: Payer: Self-pay

## 2022-07-08 ENCOUNTER — Ambulatory Visit (INDEPENDENT_AMBULATORY_CARE_PROVIDER_SITE_OTHER): Payer: Medicare Other

## 2022-07-08 ENCOUNTER — Ambulatory Visit (INDEPENDENT_AMBULATORY_CARE_PROVIDER_SITE_OTHER): Payer: Medicare Other | Admitting: Allergy and Immunology

## 2022-07-08 VITALS — BP 130/76 | HR 65 | Temp 98.1°F | Ht 71.5 in | Wt 192.9 lb

## 2022-07-08 DIAGNOSIS — J4540 Moderate persistent asthma, uncomplicated: Secondary | ICD-10-CM

## 2022-07-08 DIAGNOSIS — J309 Allergic rhinitis, unspecified: Secondary | ICD-10-CM | POA: Diagnosis not present

## 2022-07-08 DIAGNOSIS — K219 Gastro-esophageal reflux disease without esophagitis: Secondary | ICD-10-CM | POA: Diagnosis not present

## 2022-07-08 DIAGNOSIS — J3089 Other allergic rhinitis: Secondary | ICD-10-CM | POA: Diagnosis not present

## 2022-07-08 MED ORDER — PANTOPRAZOLE SODIUM 40 MG PO TBEC: 40.0000 mg | DELAYED_RELEASE_TABLET | Freq: Two times a day (BID) | ORAL | 1 refills | Status: DC

## 2022-07-08 MED ORDER — BUDESONIDE 0.25 MG/2ML IN SUSP: 0.2500 mg | RESPIRATORY_TRACT | 1 refills | Status: DC | PRN

## 2022-07-08 MED ORDER — ALBUTEROL SULFATE HFA 108 (90 BASE) MCG/ACT IN AERS: 2.0000 | INHALATION_SPRAY | RESPIRATORY_TRACT | 1 refills | Status: DC | PRN

## 2022-07-08 MED ORDER — LORATADINE 10 MG PO TABS: 10.0000 mg | ORAL_TABLET | Freq: Every day | ORAL | 5 refills | Status: DC | PRN

## 2022-07-08 MED ORDER — SPACER/AERO-HOLDING CHAMBERS DEVI: 1.0000 | 1 refills | Status: DC

## 2022-07-08 MED ORDER — BUDESONIDE-FORMOTEROL FUMARATE 160-4.5 MCG/ACT IN AERO: 2.0000 | INHALATION_SPRAY | Freq: Two times a day (BID) | RESPIRATORY_TRACT | 5 refills | Status: DC

## 2022-07-08 MED ORDER — FLUTICASONE PROPIONATE 50 MCG/ACT NA SUSP: 2.0000 | Freq: Every day | NASAL | 5 refills | Status: DC

## 2022-07-08 NOTE — Patient Instructions (Addendum)
  1. Continue Symbicort 160 - 2 puffs twice a day with spacer (empty lungs)  2. Continue Flonase 1-2 sprays each nostril one time per day   3. Continue Protonix 40 mg 1-2 times per day  4. Continue the following if needed:   A. Claritin 10 mg - 1 tablet 1 time per day (No Zyrtec)  B. Albuterol HFA  5. Proceed with home sleep study. Proceed with ECHO  6. Can add budesonide nebulization 2-4 times per day as part of "Action Plan"   7. Fatigue??? Lamictal?, Clonazepam?  7. Return to clinic in 6 months or earlier if problem

## 2022-07-08 NOTE — Progress Notes (Unsigned)
[place] [facility] [place] [facility] [place]   Follow-up Note  Referring Provider: [facility] Primary Provider: [facility] [place] Date of Office Visit: 07/08/2022  Subjective:   Arthur Moore (DOB: 1950-05-31) is a 72 y.o. male who returns to the [facility] on 07/08/2022 in re-evaluation of the following:  HPI: Arthur Moore returns to this clinic in evaluation of asthma, allergic rhinitis, LPR.  I last saw him in this clinic 10 December 2021.  He visited with our nurse practitioner on 25 June 2022.  At this time his breathing is doing quite well from the point in which she saw our nurse practitioner as he was having some issues with shortness of breath.  He used his budesonide nebulization for about a week and most of that issue has resolved.  He still has a lot of yawning and occasionally he feels as though he has to take a deep breath but overall he is significantly improved and his requirement for short acting bronchodilator is back to his baseline of just 1 or 2 times a week while he continues on Symbicort on a regular basis.  He had very little problems with his nose.  His reflux is under good control.  He is proceeding with further evaluation for dyspnea and fatigue including having a home sleep study and an echocardiogram within the near future.  His recent coronary artery morphology study was normal.  Allergies as of 07/08/2022       Reactions   Atorvastatin Other (See Comments)   Calf pain. Other reaction(s): leb numbness   Zolpidem Other (See Comments)   Other reaction(s): Drowsy        Medication List    albuterol 108 (90 Base) MCG/ACT inhaler Commonly known as: VENTOLIN HFA Inhale 2 puffs into the lungs every 4 (four) hours as needed for wheezing or shortness of breath.   aspirin EC 81 MG tablet Take 81 mg by mouth daily.   budesonide-formoterol 160-4.5 MCG/ACT inhaler Commonly known as:  SYMBICORT Inhale 2 puffs into the lungs 2 (two) times daily.   cetirizine 10 MG tablet Commonly known as: ZyrTEC Allergy Take 1 tablet (10 mg total) by mouth daily as needed (can take an ectra dose during flare ups).   clonazePAM 0.5 MG tablet Commonly known as: KlonoPIN Take 1 tablet (0.5 mg total) by mouth 3 (three) times daily as needed for anxiety.   fluticasone 50 MCG/ACT nasal spray Commonly known as: FLONASE 1-2 sprays each nostril 1 (ONE) time per day.   ivabradine 5 MG Tabs tablet Commonly known as: CORLANOR TAKE 2 TABLETS BY MOUTH 2 HOURS PRIOR TO CT   lamoTRIgine 200 MG tablet Commonly known as: LAMICTAL Take 1 tablet (200 mg total) by mouth 2 (two) times daily.   metoprolol tartrate 25 MG tablet Commonly known as: LOPRESSOR TAKE 1/2 TABLET BY MOUTH TWICE A DAY   nitroGLYCERIN 0.4 MG SL tablet Commonly known as: NITROSTAT Place 1 tablet (0.4 mg total) under the tongue every 5 (five) minutes as needed for chest pain.   pantoprazole 40 MG tablet Commonly known as: PROTONIX Take 1 tablet by mouth 1-2 times per day.   rosuvastatin 10 MG tablet Commonly known as: CRESTOR Take 1 tablet by mouth daily.   Vitamin D3 125 MCG (5000 UT) Tabs Generic drug: Cholecalciferol Take 5,000 Units by mouth daily.   zinc gluconate 50 MG tablet Take 1 tablet (50 mg total) by mouth daily.  Past Medical History:  Diagnosis Date   Aortic insufficiency    Arthritis    Asthma    Carotid bruit 07/09/2021   Cervical disc disease    Depression    Diastolic dysfunction A999333   Hyperlipidemia 08/24/2015   Hypertension    Lumbar disc disease    Psoriasis     Past Surgical History:  Procedure Laterality Date   CHOLECYSTECTOMY  2012   HERNIA REPAIR     KNEE ARTHROSCOPY W/ MENISCAL REPAIR  11/2021   LEFT HEART CATH AND CORONARY ANGIOGRAPHY N/A 07/08/2018   Procedure: LEFT HEART CATH AND CORONARY ANGIOGRAPHY;  Surgeon: Burnell Blanks, MD;  Location: [facility];  Service: Cardiovascular;  Laterality: N/A;   PAROTIDECTOMY Right 1992   PROSTATE SURGERY  2002, 2004   [place]     SINUS EXPLORATION  2013   SPINE SURGERY  2013   L C7-T1 laminectomy and discectomy   TONSILLECTOMY      Review of systems negative except as noted in HPI / PMHx or noted below:  Review of Systems  Constitutional: Negative.   HENT: Negative.    Eyes: Negative.   Respiratory: Negative.    Cardiovascular: Negative.   Gastrointestinal: Negative.   Genitourinary: Negative.   Musculoskeletal: Negative.   Skin: Negative.   Neurological: Negative.   Endo/Heme/Allergies: Negative.   Psychiatric/Behavioral: Negative.       Objective:   Vitals:   07/08/22 0832  BP: 130/76  Pulse: 65  Temp: 98.1 F (36.7 C)  SpO2: 96%   Height: 5' 11.5" (181.6 cm)  Weight: 192 lb 14.4 oz (87.5 kg)   Physical Exam Constitutional:      Appearance: He is not diaphoretic.  HENT:     Head: Normocephalic.     Right Ear: Tympanic membrane, ear canal and external ear normal.     Left Ear: Tympanic membrane, ear canal and external ear normal.     Nose: Nose normal. No mucosal edema or rhinorrhea.     Mouth/Throat:     Pharynx: Uvula midline. No oropharyngeal exudate.  Eyes:     Conjunctiva/sclera: Conjunctivae normal.  Neck:     Thyroid: No thyromegaly.     Trachea: Trachea normal. No tracheal tenderness or tracheal deviation.  Cardiovascular:     Rate and Rhythm: Normal rate and regular rhythm.     Heart sounds: Normal heart sounds, S1 normal and S2 normal. No murmur heard. Pulmonary:     Effort: No respiratory distress.     Breath sounds: Normal breath sounds. No stridor. No wheezing or rales.  Lymphadenopathy:     Head:     Right side of head: No tonsillar adenopathy.     Left side of head: No tonsillar adenopathy.     Cervical: No cervical adenopathy.  Skin:    Findings: No erythema or rash.     Nails: There is no clubbing.  Neurological:     Mental  Status: He is alert.     Diagnostics:    Spirometry was performed and demonstrated an FEV1 of 2.66 at 81 % of predicted.  The patient had an Asthma Control Test with the following results: ACT Total Score: 17.    Assessment and Plan:   1. Not well controlled moderate persistent asthma   2. Other allergic rhinitis   3. LPRD (laryngopharyngeal reflux disease)    1. Continue Symbicort 160 - 2 puffs twice a day with spacer (empty lungs)  2. Continue Flonase 1-2  sprays each nostril one time per day   3. Continue Protonix 40 mg 1-2 times per day  4. Continue the following if needed:   A. Claritin 10 mg - 1 tablet 1 time per day (No Zyrtec)  B. Albuterol HFA  5. Proceed with home sleep study. Proceed with ECHO  6. Can add budesonide nebulization 2-4 times per day as part of "Action Plan"   7. Fatigue??? Lamictal?, Clonazepam?  7. Return to clinic in 6 months or earlier if problem  [name] appears to be doing okay regarding his breathing and he will probably have a normal echo hopefully this week and he will hopefully have a normal sleep study this week.  He does have a lot of fatigue and yawning and maybe that is a side effect of his Lamictal or his clonazepam or Zyrtec and I had a talk with him about this issue today.  He will remain on anti-inflammatory agents for his airway including the use of a "action plan" if it is needed and he will continue to treat his reflux as noted above.  I will see him back in this clinic in 6 months or earlier if there is a problem.  Allena Katz, MD Allergy / Immunology [place]

## 2022-07-09 ENCOUNTER — Ambulatory Visit (INDEPENDENT_AMBULATORY_CARE_PROVIDER_SITE_OTHER): Payer: Medicare Other

## 2022-07-09 ENCOUNTER — Encounter: Payer: Self-pay | Admitting: Allergy and Immunology

## 2022-07-09 DIAGNOSIS — R0609 Other forms of dyspnea: Secondary | ICD-10-CM

## 2022-07-09 DIAGNOSIS — I10 Essential (primary) hypertension: Secondary | ICD-10-CM

## 2022-07-09 LAB — ECHOCARDIOGRAM COMPLETE
Area-P 1/2: 2.66 cm2
P 1/2 time: 433 msec
S' Lateral: 3.2 cm

## 2022-07-11 ENCOUNTER — Other Ambulatory Visit: Payer: Self-pay | Admitting: Gastroenterology

## 2022-07-11 ENCOUNTER — Ambulatory Visit (INDEPENDENT_AMBULATORY_CARE_PROVIDER_SITE_OTHER): Payer: Medicare Other | Admitting: Family

## 2022-07-11 ENCOUNTER — Encounter (HOSPITAL_BASED_OUTPATIENT_CLINIC_OR_DEPARTMENT_OTHER): Payer: Self-pay | Admitting: Family

## 2022-07-11 VITALS — BP 145/72 | HR 69 | Ht 71.5 in | Wt 196.0 lb

## 2022-07-11 DIAGNOSIS — I2510 Atherosclerotic heart disease of native coronary artery without angina pectoris: Secondary | ICD-10-CM | POA: Diagnosis not present

## 2022-07-11 DIAGNOSIS — K7460 Unspecified cirrhosis of liver: Secondary | ICD-10-CM

## 2022-07-11 DIAGNOSIS — J4540 Moderate persistent asthma, uncomplicated: Secondary | ICD-10-CM

## 2022-07-11 DIAGNOSIS — E785 Hyperlipidemia, unspecified: Secondary | ICD-10-CM

## 2022-07-11 DIAGNOSIS — K862 Cyst of pancreas: Secondary | ICD-10-CM

## 2022-07-11 MED ORDER — NITROGLYCERIN 0.4 MG SL SUBL: 0.4000 mg | SUBLINGUAL_TABLET | SUBLINGUAL | 3 refills | Status: DC | PRN

## 2022-07-11 NOTE — Patient Instructions (Addendum)
Medication Instructions:  Continue your current medications.  *If you need a refill on your cardiac medications before your next appointment, please call your pharmacy*   Follow-Up: At [facility], you and your health needs are our priority.  As part of our continuing mission to provide you with exceptional heart care, we have created designated Provider Care Teams.  These Care Teams include your primary Cardiologist (physician) and Advanced Practice Providers (APPs -  Physician Assistants and Nurse Practitioners) who all work together to provide you with the care you need, when you need it.  We recommend signing up for the patient portal called "MyChart".  Sign up information is provided on this After Visit Summary.  MyChart is used to connect with patients for Virtual Visits (Telemedicine).  Patients are able to view lab/test results, encounter notes, upcoming appointments, etc.  Non-urgent messages can be sent to your provider as well.   To learn more about what you can do with MyChart, go to [email or web address].    Your next appointment:   As scheduled with Dr. Oval Linsey      Provider Referral Exercise Program (P.R.E.P.)      12 Weeks to Wellness       What is included in the [place] and personalized exercise prescription --Full Membership to participating [facility] for the 12 weeks --Pre- and Post-consultations to assess progress and formulate an exercise plan for       continuation of exercise   What is my investment?  Your cost is $100 (reg. $144). This includes full membership privileges to [facility] in [place] and across the country. If you complete the post-assessment visit, any applicable enrollment costs will be waived if you decide to join the [facility].   Who will benefit from PREP?  People with challenges, including (but not limited to): ---Low back pain ---Arthritis ---Hypertension ---Diabetes ---Obesity ---Joint  Replacement ---Neuromuscular Disorders ---Cancer Recovery ---Weight Loss ---Many others (as determined by provider)   Get Started Today! Ask your healthcare provider to fill out a Healthcare Provider Referral for Exercise form. Be sure to turn it in before you leave the office and send/fax/email it directly to the Wellness RN to schedule your Initial Consultation. If you have family or friends that would also benefit, please share this referral with them.   Contacts:  [facility] [phone]    Winifred Olive, Wellness RN  [phone]  [email or web address]

## 2022-07-11 NOTE — Progress Notes (Signed)
Office Visit    Patient Name: Arthur Moore Date of Encounter: 07/11/2022  PCP:  [facility] @ [place]  Cardiologist:  Skeet Latch, MD  Advanced Practice Provider:  No care team member to display Electrophysiologist:  None      Chief Complaint    Arthur Moore is a 72 y.o. male presents today for follow-up after cardiac testing  Past Medical History    Past Medical History:  Diagnosis Date   Aortic insufficiency    Arthritis    Asthma    Carotid bruit 07/09/2021   Cervical disc disease    Depression    Diastolic dysfunction [id number]   Hyperlipidemia 08/24/2015   Hypertension    Lumbar disc disease    Psoriasis    Past Surgical History:  Procedure Laterality Date   CHOLECYSTECTOMY  2012   HERNIA REPAIR     KNEE ARTHROSCOPY W/ MENISCAL REPAIR  11/2021   LEFT HEART CATH AND CORONARY ANGIOGRAPHY N/A 07/08/2018   Procedure: LEFT HEART CATH AND CORONARY ANGIOGRAPHY;  Surgeon: Burnell Blanks, MD;  Location: [facility];  Service: Cardiovascular;  Laterality: N/A;   PAROTIDECTOMY Right 1992   PROSTATE SURGERY  2002, 2004   SINOSCOPY     SINUS EXPLORATION  2013   SPINE SURGERY  2013   L C7-T1 laminectomy and discectomy   TONSILLECTOMY      Allergies  Allergies  Allergen Reactions   Atorvastatin Other (See Comments)    Calf pain. Other reaction(s): leb numbness   Zolpidem Other (See Comments)    Other reaction(s): Drowsy    History of Present Illness    [name] is a 72 y.o. male with a hx of mild CAD, aortic insufficiency, diastolic dysfunction, asthma, hypertension, hyperlipidemia, psoriasis last seen 06/19/2022.  Previously followed cardiologist in [place] due to mild AR.  Echo [facility] 2015 mild to moderate AI.  Prior monitor 2015 rare PACs and PVC.  Prior echo 12/05/2015 EF 55 to [id number], grade 2 diastolic dysfunction, mild to moderate AI.  Exercise Myoview 06/2016 EF 49%,  asynchronous contraction, small defect in basal inferior region thought to be diaphragmatic attenuation-able to achieve 7 METS on Bruce protocol. LHC 06/2018 mild nonobstructive coronary disease.  Echo 06/2020 EF 99991111, grade 1 diastolic dysfunction, no RWMA, RV normal size and function, trivial MR, mild AI, mild aortic valve sclerosis without stenosis.  Carotid duplex 06/2021 bilateral minimal thickening or plaque.August 2023 underwent repair of torn meniscus. November 2023 sleep study with pulmonology with no sleep apnea.  He saw Dr. Oval Linsey 06/19/2022 after contacting the office regarding shortness of breath for 1 month.  He noted decreased exercise tolerance.  He also had nonexertional left pectoral chest pain.  Cardiac CTA and echo ordered.  Coronary CTA 06/30/2022 coronary calcium score of 1027 placing him in the 84th percentile for age/sex matched control with moderate mixed coronary artery disease.  RCA with minimal mixed distal 1-24% stenosis, LAD proximal vessel 50-69% stenosis, high D1 branch minimal 1-24% stenosis, LCx with AV groove vessel next 1-24% proximal stenosis.  FFR revealed no significant stenosis with all readings greater than 0.8.  Echo 07/09/2022 normal LVEF 55 to 60%, no RWMA, grade 1 diastolic dysfunction, RV normal, mild AI.  Presents today for follow-up with his wife. He reports his breathing is a bit better since last seen - has had to use his inhalers and nebulizer which improved dyspnea. Exercise tolerance limited by right knee  pain but interested in being more active. We spent 45 minutes reviewing his echocardiogram and cardiac CTA in detail. He was understandably concerned as prior coronary calcium score of 365 in 2019 and score now higher. Discussed that as plaque calcifies calcium score will be higher but his GDMT Rosuvastatin, Aspirin, Metoprolol help prevent progression and ASCVD event. He is very concerned by "widow maker" - lengthy discussion that his RCA is dominant vessel  and has minimal 1-24% stenosis. Reviewed FFR revealing normal blood flow in detail.  EKGs/Labs/Other Studies Reviewed:   The following studies were reviewed today:  Cardiac Studies & Procedures   CARDIAC CATHETERIZATION  CARDIAC CATHETERIZATION 07/08/2018  Narrative  Dist RCA lesion is 20% stenosed.  Prox Cx lesion is 10% stenosed.  Dist LM to Ost LAD lesion is 20% stenosed.  Ost 1st Diag lesion is 50% stenosed.  The left ventricular systolic function is normal.  LV end diastolic pressure is normal.  The left ventricular ejection fraction is 50-55% by visual estimate.  There is no mitral valve regurgitation.  Prox LAD lesion is 20% stenosed.  1. Mild non-obstructive CAD 2. Low normal LV systolic function  Recommendations: Medical management of CAD  Findings Coronary Findings Diagnostic  Dominance: Right  Left Main Dist LM to Ost LAD lesion is 20% stenosed.  Left Anterior Descending Vessel is large. Prox LAD lesion is 20% stenosed.  First Diagonal Branch Vessel is moderate in size. Ost 1st Diag lesion is 50% stenosed.  Left Circumflex Prox Cx lesion is 10% stenosed.  Right Coronary Artery Vessel is large. Dist RCA lesion is 20% stenosed.  Intervention  No interventions have been documented.   STRESS TESTS  MYOCARDIAL PERFUSION IMAGING 06/26/2016  Narrative  Nuclear stress EF: 49%. Asynchronous contraction.  The left ventricular ejection fraction is mildly decreased (45-54%).  There was no ST segment deviation noted during stress.  Defect 1: There is a small defect of mild severity present in the basal inferior location. Likely diaphragmatic attenuation artifact.  This is a low risk study. No ischemia identified.  Candee Furbish, MD   ECHOCARDIOGRAM  ECHOCARDIOGRAM COMPLETE 07/09/2022  Narrative ECHOCARDIOGRAM REPORT    Patient Name:   Arthur Moore Date of Exam: 07/09/2022 Medical Rec #:  [id number]         Height:       71.5  in Accession #:    [id number]        Weight:       192.9 lb Date of Birth:  03/07/1951         BSA:          2.087 m Patient Age:    70 years          BP:           121/76 mmHg Patient Gender: M                 HR:           74 bpm. Exam Location:  Outpatient  Procedure: 2D Echo, 3D Echo, Color Doppler, Cardiac Doppler and Strain Analysis  Indications:    Dyspnea  History:        Patient has prior history of Echocardiogram examinations, most recent 07/20/2020. CAD; Risk Factors:Dyslipidemia, Non-Smoker and Hypertension.  Sonographer:    Leavy Cella RDCS Referring Phys: [id number] [place]  IMPRESSIONS   1. Left ventricular ejection fraction, by estimation, is 55 to 60%. Left ventricular ejection fraction by 3D volume is 55 %. The left ventricle  has normal function. The left ventricle has no regional wall motion abnormalities. Left ventricular diastolic parameters are consistent with Grade I diastolic dysfunction (impaired relaxation). 2. Right ventricular systolic function is normal. The right ventricular size is normal. There is normal pulmonary artery systolic pressure. The estimated right ventricular systolic pressure is Q000111Q mmHg. 3. The mitral valve is normal in structure. No evidence of mitral valve regurgitation. No evidence of mitral stenosis. 4. The aortic valve is tricuspid. Aortic valve regurgitation is mild. No aortic stenosis is present. 5. The inferior vena cava is normal in size with greater than 50% respiratory variability, suggesting right atrial pressure of 3 mmHg.  FINDINGS Left Ventricle: Left ventricular ejection fraction, by estimation, is 55 to 60%. Left ventricular ejection fraction by 3D volume is 55 %. The left ventricle has normal function. The left ventricle has no regional wall motion abnormalities. Global longitudinal strain performed but not reported based on interpreter judgement due to suboptimal tracking. The left ventricular internal cavity  size was normal in size. There is borderline concentric left ventricular hypertrophy. Left ventricular diastolic parameters are consistent with Grade I diastolic dysfunction (impaired relaxation). Normal left ventricular filling pressure.  Right Ventricle: The right ventricular size is normal. No increase in right ventricular wall thickness. Right ventricular systolic function is normal. There is normal pulmonary artery systolic pressure. The tricuspid regurgitant velocity is 2.31 m/s, and with an assumed right atrial pressure of 3 mmHg, the estimated right ventricular systolic pressure is Q000111Q mmHg.  Left Atrium: Left atrial size was normal in size.  Right Atrium: Right atrial size was normal in size.  Pericardium: There is no evidence of pericardial effusion.  Mitral Valve: The mitral valve is normal in structure. No evidence of mitral valve regurgitation. No evidence of mitral valve stenosis.  Tricuspid Valve: The tricuspid valve is normal in structure. Tricuspid valve regurgitation is trivial. No evidence of tricuspid stenosis.  Aortic Valve: The aortic valve is tricuspid. Aortic valve regurgitation is mild. Aortic regurgitation PHT measures 433 msec. No aortic stenosis is present.  Pulmonic Valve: The pulmonic valve was normal in structure. Pulmonic valve regurgitation is trivial. No evidence of pulmonic stenosis.  Aorta: The aortic root is normal in size and structure.  Venous: The inferior vena cava is normal in size with greater than 50% respiratory variability, suggesting right atrial pressure of 3 mmHg.  IAS/Shunts: No atrial level shunt detected by color flow Doppler.   LEFT VENTRICLE PLAX 2D LVIDd:         4.95 cm         Diastology LVIDs:         3.20 cm         LV e' medial:    6.85 cm/s LV PW:         1.16 cm         LV E/e' medial:  7.7 LV IVS:        1.16 cm         LV e' lateral:   7.51 cm/s LVOT diam:     2.10 cm         LV E/e' lateral: 7.0 LV SV:         66 LV  SV Index:   32 LVOT Area:     3.46 cm        3D Volume EF LV 3D EF:    Left ventricul ar ejection fraction by 3D volume is 55 %.  3D Volume EF: 3D EF:  55 % LV EDV:       133 ml LV ESV:       60 ml LV SV:        73 ml  RIGHT VENTRICLE RV Basal diam:  4.31 cm RV Mid diam:    3.21 cm RV S prime:     15.50 cm/s TAPSE (M-mode): 2.6 cm  LEFT ATRIUM             Index        RIGHT ATRIUM           Index LA diam:        3.40 cm 1.63 cm/m   RA Area:     11.80 cm LA Vol (A2C):   47.1 ml 22.57 ml/m  RA Volume:   26.50 ml  12.70 ml/m LA Vol (A4C):   23.9 ml 11.45 ml/m LA Biplane Vol: 33.5 ml 16.05 ml/m AORTIC VALVE LVOT Vmax:   92.20 cm/s LVOT Vmean:  59.200 cm/s LVOT VTI:    0.191 m AI PHT:      433 msec  AORTA Ao Root diam: 3.50 cm Ao Asc diam:  3.40 cm  MITRAL VALVE               TRICUSPID VALVE MV Area (PHT): 2.66 cm    TR Peak grad:   21.3 mmHg MV Decel Time: 285 msec    TR Vmax:        231.00 cm/s MV E velocity: 52.90 cm/s MV A velocity: 80.70 cm/s  SHUNTS MV E/A ratio:  0.66        Systemic VTI:  0.19 m Systemic Diam: 2.10 cm  Dani Gobble Croitoru MD Electronically signed by Sanda Klein MD Signature Date/Time: 07/09/2022/3:57:56 PM    Final     CT SCANS  CT CORONARY MORPH W/CTA COR W/SCORE 07/01/2022  Addendum 07/01/2022  8:25 PM ADDENDUM REPORT: 07/01/2022 20:23  EXAM: OVER-READ INTERPRETATION  CT CHEST  The following report is an over-read performed by radiologist Dr. Collene Leyden [facility] on 07/01/2022. This over-read does not include interpretation of cardiac or coronary anatomy or pathology. The coronary CTA interpretation by the cardiologist is attached.  COMPARISON:  None.  FINDINGS: Heart is normal size. Aorta normal caliber. No adenopathy. No confluent airspace opacities or effusions. No acute findings in the upper abdomen. Chest wall soft tissues are unremarkable. No acute bony abnormality.  IMPRESSION: No acute  extra cardiac abnormality.  Scattered descending aortic atherosclerosis.   Electronically Signed By: Rolm Baptise M.D. On: 07/01/2022 20:23  Narrative HISTORY: 72 yo male with dyspnea on exertion (DOE)  EXAM: Cardiac/Coronary CTA  TECHNIQUE: The patient was scanned on a [employer].  PROTOCOL: A 120 kV prospective scan was triggered in the descending thoracic aorta at 111 HU's. Axial non-contrast 3 mm slices were carried out through the heart. The data set was analyzed on a dedicated work station and scored using the [place]. Gantry rotation speed was 250 msecs and collimation was .6 mm. Beta blockade and 0.8 mg of sl NTG was given. The 3D data set was reconstructed in 5% intervals of the 35-75 % of the R-R cycle. Diastolic phases were analyzed on a dedicated work station using MPR, MIP and VRT modes. The patient received 47mL OMNIPAQUE IOHEXOL 350 MG/ML SOLN of contrast.  FINDINGS: Quality: Good, HR 74  Coronary calcium score: The patient's coronary artery calcium score is 1027, which places the patient in the 84th percentile.  Coronary arteries:  Normal coronary origins.  Right dominance.  Right Coronary Artery: Dominant. Minimal mixed distal 1-24% stenosis.  Left Main Coronary Artery: Normal. Bifurcates into the LAD and LCx arteries.  Left Anterior Descending Coronary Artery: Anterior artery that extends around the apex. The proximal vessel is heavily calcified with probably mild to moderate stenosis (50-69%). High D1 branch which bifurcates and has minimal 1-24% proximal stenosis.  Left Circumflex Artery: AV groove vessel with minimal mixed 1-24% proximal stenosis. There is a large OM branch which bifurcates and is without disease.  Aorta: Normal size, 32 mm at the mid ascending aorta (level of the PA bifurcation) measured double oblique. Aortic atherosclerosis. No dissection.  Aortic Valve: Trileaflet. No calcifications.  Other  findings:  Normal pulmonary vein drainage into the left atrium.  Normal left atrial appendage without a thrombus.  Normal size of the pulmonary artery.  IMPRESSION: 1. Moderate mixed CAD, CADRADS = 3. CT FFR will be performed and reported separately.  2. Coronary calcium score of 1027. This was 84th percentile for age and sex matched control.  3. Normal coronary origin with right dominance.  4. Aortic atherosclerosis  Electronically Signed: By: Pixie Casino M.D. On: 06/30/2022 16:58   CT SCANS  CT CORONARY MORPH W/CTA COR W/SCORE 05/21/2017  Addendum 05/21/2017 12:52 PM ADDENDUM REPORT: 05/21/2017 12:49  ADDENDUM: CT sent for FFR.  FFR 0.83 in mid LAD. I do no think there is hemodynamically significant disease in the LAD. The LCx and RCA have no hemodynamically significant disease.   Electronically Signed By: Loralie Champagne M.D. On: 05/21/2017 12:49  Addendum 05/15/2017 11:10 PM ADDENDUM REPORT: 05/15/2017 23:07  CLINICAL DATA:  Chest pain  EXAM: Cardiac CTA  MEDICATIONS: Sub lingual nitro. 4mg  and lopressor mg  TECHNIQUE: The patient was scanned on a Siemens [id number] slice scanner. Gantry rotation speed was 240 msecs. Collimation was 0.6 mm. A 100 kV prospective scan was triggered in the acending thoracic aorta at 35-75% of the R-R interval. Average HR during the scan was 65 bpm. The 3D data set was interpreted on a dedicated work station using MPR, MIP and VRT modes. A total of 80cc of contrast was used.  FINDINGS: Non-cardiac: See separate report from [facility].  Calcium Score: 361 Agatston units  Coronary Arteries: Right dominant with no anomalies  LM: No plaque or stenosis.  LAD system: Mixed plaque ostium D1 with mild stenosis. Extensive mixed plaque in the proximal LAD after D1. Cannot rule out moderate stenosis though blooming artifact makes this difficult to interpret.  Circumflex system: Mixed plaque proximal LCx without  significant stenosis.  RCA system: Calcified plaque in the proximal RCA without significant stenosis.  IMPRESSION: 1. Coronary artery calcium score 361 Agatston units, placing the patient in the 78th percentile for age and gender, suggesting intermediate to high risk for future cardiac events.  2. Extensive calcified plaque in the proximal LAD, possible moderate stenosis though blooming artifact may make the disease look worse than it actually is. I will send for FFR.  Dalton Mclean   Electronically Signed By: Loralie Champagne M.D. On: 05/15/2017 23:07  Narrative EXAM: OVER-READ INTERPRETATION  CT CHEST  The following report is an over-read performed by radiologist Dr. Aletta Edouard of [facility], [place] on 05/15/2017. This over-read does not include interpretation of cardiac or coronary anatomy or pathology. The coronary calcium score/coronary CTA interpretation by the cardiologist is attached.  COMPARISON:  CTA of the chest on 05/09/2017  FINDINGS: Vascular: Normal caliber central pulmonary arteries.  Mediastinum/Nodes: No lymphadenopathy identified.  Lungs/Pleura: Mild pulmonary scarring. No nodules or infiltrates. No edema or pleural fluid.  Upper Abdomen: No acute abnormality.  Musculoskeletal: No chest wall mass or suspicious bone lesions identified.  IMPRESSION: No significant nonvascular findings.  Electronically Signed: By: Aletta Edouard M.D. On: 05/15/2017 16:37           EKG:  EKG is not ordered today.  Recent Labs: 11/12/2021: B Natriuretic Peptide 31.8 03/21/2022: Hemoglobin 15.7; Magnesium 2.1; Platelets 139; TSH 1.661 07/02/2022: BUN 17; Creatinine, Ser 1.25; Potassium 4.2; Sodium 143  Recent Lipid Panel    Component Value Date/Time   CHOL 181 10/18/2020 1130   TRIG 165 (H) 10/18/2020 1130   HDL 29 (L) 10/18/2020 1130   CHOLHDL 6.2 (H) 10/18/2020 1130   CHOLHDL 4.1 07/09/2018 0407   VLDL 25 07/09/2018 0407   LDLCALC 122 (H)  10/18/2020 1130    Home Medications   Current Meds  Medication Sig   albuterol (VENTOLIN HFA) 108 (90 Base) MCG/ACT inhaler Inhale 2 puffs into the lungs every 4 (four) hours as needed for wheezing or shortness of breath.   aspirin EC 81 MG tablet Take 81 mg by mouth daily.   budesonide (PULMICORT) 0.25 MG/2ML nebulizer solution Take 2 mLs (0.25 mg total) by nebulization every 4 (four) hours as needed (During flare ups).   budesonide-formoterol (SYMBICORT) 160-4.5 MCG/ACT inhaler Inhale 2 puffs into the lungs 2 (two) times daily.   cetirizine (ZYRTEC ALLERGY) 10 MG tablet Take 1 tablet (10 mg total) by mouth daily as needed (can take an ectra dose during flare ups).   Cholecalciferol (VITAMIN D3) 5000 units TABS Take 5,000 Units by mouth daily.    clonazePAM (KLONOPIN) 0.5 MG tablet Take 1 tablet (0.5 mg total) by mouth 3 (three) times daily as needed for anxiety.   fluticasone (FLONASE) 50 MCG/ACT nasal spray Place 2 sprays into both nostrils daily. 1-2 sprays each nostril 1 (ONE) time per day.   ivabradine (CORLANOR) 5 MG TABS tablet TAKE 2 TABLETS BY MOUTH 2 HOURS PRIOR TO CT   lamoTRIgine (LAMICTAL) 200 MG tablet Take 1 tablet (200 mg total) by mouth 2 (two) times daily.   loratadine (CLARITIN) 10 MG tablet Take 1 tablet (10 mg total) by mouth daily as needed for allergies (Can take an extra dose during flare ups.).   metoprolol tartrate (LOPRESSOR) 25 MG tablet TAKE 1/2 TABLET BY MOUTH TWICE A DAY   pantoprazole (PROTONIX) 40 MG tablet Take 1 tablet (40 mg total) by mouth 2 (two) times daily. Take 1 tablet by mouth 1-2 times per day.   rosuvastatin (CRESTOR) 10 MG tablet Take 1 tablet by mouth daily.   Spacer/Aero-Holding Chambers DEVI 1 Device by Does not apply route as directed.   zinc gluconate 50 MG tablet Take 1 tablet (50 mg total) by mouth daily.    Review of Systems      All other systems reviewed and are otherwise negative except as noted above.  Physical Exam    VS:  BP  (!) 145/72   Pulse 69   Ht 5' 11.5" (1.816 m)   Wt 196 lb (88.9 kg)   BMI 26.96 kg/m  , BMI Body mass index is 26.96 kg/m.  Wt Readings from Last 3 Encounters:  07/11/22 196 lb (88.9 kg)  07/08/22 192 lb 14.4 oz (87.5 kg)  06/19/22 193 lb 8 oz (87.8 kg)    GEN: Well nourished, well developed, in no acute distress. HEENT: normal. Neck:  Supple, no JVD, carotid bruits, or masses. Cardiac: RRR, no murmurs, rubs, or gallops. No clubbing, cyanosis, edema.  Radials/PT 2+ and equal bilaterally.  Respiratory:  Respirations regular and unlabored, clear to auscultation bilaterally. GI: Soft, nontender, nondistended. MS: No deformity or atrophy. Skin: Warm and dry, no rash. Neuro:  Strength and sensation are intact. Psych: Normal affect.  Assessment & Plan    CAD- Stable with no anginal symptoms. No indication for ischemic evaluation.  CTA with nonobstructive disease. Reassurance provided. Lengthy discussion of 45 minutes reviewing results. GDMT Aspirin, Metoprolol, Rosuvastatin .Heart healthy diet and regular cardiovascular exercise encouraged.    Dyspnea - Echo 07/09/22 normal LVEF, mild AI not contributory to dyspnea. Cardiac CTA 06/30/22 with nonobstructive CAD by FFR. No cardiac etiology. Likely multifactorial deconditioning, asthma. Dyspnea is improving. Referred to PREP program (also referred his wife so they can participate together) to increase exercise tolerance.   Mild AR - By echo 07/09/22. Stable finding since 2014. Continue optimal BP control.   HLD, LDL goal < 70- Most recent LDL 47.  At goal less than 55.  Continue rosuvastatin.  Asthma - Follows with Dr. Carmelina Peal.   Lengthy clinic visit of >45 minutes reviewing echocardiogram and cardiac CTA results with patient. He brought reports printed from [facility]. Additionally provided him FFR printout of reassuring flow measurements.     Disposition: Follow up in 6 month(s) with Skeet Latch, MD or APP.  Signed, Loel Dubonnet,  NP 07/11/2022, 12:54 PM [place]

## 2022-07-14 ENCOUNTER — Ambulatory Visit (HOSPITAL_BASED_OUTPATIENT_CLINIC_OR_DEPARTMENT_OTHER): Payer: Medicare Other | Admitting: Cardiovascular Disease

## 2022-07-18 ENCOUNTER — Telehealth: Payer: Self-pay

## 2022-07-18 NOTE — Telephone Encounter (Signed)
Called re: PREP program referral, left voicemail 

## 2022-07-18 NOTE — Progress Notes (Signed)
Created in error

## 2022-07-19 ENCOUNTER — Other Ambulatory Visit: Payer: Self-pay | Admitting: Cardiovascular Disease

## 2022-07-21 NOTE — Telephone Encounter (Signed)
Rx(s) sent to pharmacy electronically.  

## 2022-07-25 ENCOUNTER — Encounter (HOSPITAL_BASED_OUTPATIENT_CLINIC_OR_DEPARTMENT_OTHER): Payer: Self-pay | Admitting: Cardiovascular Disease

## 2022-07-25 NOTE — Assessment & Plan Note (Signed)
Continue rosuvastatin, diet, and exercise.

## 2022-07-25 NOTE — Assessment & Plan Note (Signed)
Blood pressure is very well-controlled.  Continue metoprolol.  Continue with diet and regular exercise.

## 2022-07-25 NOTE — Assessment & Plan Note (Addendum)
Stable on multiple echos  He now reports increased dyspnea.  Will repeat his echo.

## 2022-07-25 NOTE — Assessment & Plan Note (Signed)
He has known non-obstructive CAD.  However, he now has increasing exertional dyspnea.  We will get a coronary CT-A to see if there has been progression of his CAD.  BP is too low to give more nodal agents for the study.  Will treat with ivabradine prior to the CT.

## 2022-07-25 NOTE — Assessment & Plan Note (Signed)
This has been stable.  Will evaluate on the coronary CT-A.  Continue metoprolol.

## 2022-08-03 ENCOUNTER — Ambulatory Visit
Admission: RE | Admit: 2022-08-03 | Discharge: 2022-08-03 | Disposition: A | Discharge status: Home / Self Care | Payer: Medicare Other | Source: Ambulatory Visit | Attending: Gastroenterology | Admitting: Gastroenterology

## 2022-08-03 DIAGNOSIS — K7460 Unspecified cirrhosis of liver: Secondary | ICD-10-CM

## 2022-08-03 DIAGNOSIS — K862 Cyst of pancreas: Secondary | ICD-10-CM

## 2022-08-03 MED ORDER — GADOPICLENOL 0.5 MMOL/ML IV SOLN
9.0000 mL | Freq: Once | INTRAVENOUS | Status: AC | PRN
  Administered 2022-08-03: 9 mL via INTRAVENOUS

## 2022-08-04 ENCOUNTER — Telehealth: Payer: Self-pay

## 2022-08-04 NOTE — Telephone Encounter (Signed)
Received call and voicemail from wife Arthur Moore on his behalf; she states "he cannot make a 12 week commitment due to especially the medical appointments that he has ongoing--as things lighten up we will give you a call back to try to make a 12 week commitment"; message sent to provider who submitted referral on 07/11/22.

## 2022-08-05 ENCOUNTER — Ambulatory Visit (INDEPENDENT_AMBULATORY_CARE_PROVIDER_SITE_OTHER): Payer: Medicare Other

## 2022-08-05 DIAGNOSIS — J309 Allergic rhinitis, unspecified: Secondary | ICD-10-CM | POA: Diagnosis not present

## 2022-08-06 ENCOUNTER — Emergency Department (HOSPITAL_BASED_OUTPATIENT_CLINIC_OR_DEPARTMENT_OTHER): Payer: Medicare Other

## 2022-08-06 ENCOUNTER — Other Ambulatory Visit: Payer: Self-pay

## 2022-08-06 ENCOUNTER — Emergency Department (HOSPITAL_BASED_OUTPATIENT_CLINIC_OR_DEPARTMENT_OTHER)
Admission: EM | Admit: 2022-08-06 | Discharge: 2022-08-06 | Disposition: A | Discharge status: Home / Self Care | Payer: Medicare Other | Attending: Emergency Medicine | Admitting: Emergency Medicine

## 2022-08-06 ENCOUNTER — Encounter (HOSPITAL_BASED_OUTPATIENT_CLINIC_OR_DEPARTMENT_OTHER): Payer: Self-pay | Admitting: Emergency Medicine

## 2022-08-06 DIAGNOSIS — N50812 Left testicular pain: Secondary | ICD-10-CM | POA: Diagnosis not present

## 2022-08-06 DIAGNOSIS — N433 Hydrocele, unspecified: Secondary | ICD-10-CM | POA: Diagnosis not present

## 2022-08-06 DIAGNOSIS — N451 Epididymitis: Secondary | ICD-10-CM | POA: Insufficient documentation

## 2022-08-06 DIAGNOSIS — Z7982 Long term (current) use of aspirin: Secondary | ICD-10-CM | POA: Insufficient documentation

## 2022-08-06 DIAGNOSIS — N503 Cyst of epididymis: Secondary | ICD-10-CM | POA: Diagnosis not present

## 2022-08-06 LAB — URINALYSIS, MICROSCOPIC (REFLEX): WBC, UA: NONE SEEN WBC/hpf (ref 0–5)

## 2022-08-06 LAB — URINALYSIS, ROUTINE W REFLEX MICROSCOPIC
Bilirubin Urine: NEGATIVE
Glucose, UA: NEGATIVE mg/dL
Ketones, ur: NEGATIVE mg/dL
Leukocytes,Ua: NEGATIVE
Nitrite: NEGATIVE
Protein, ur: NEGATIVE mg/dL
Specific Gravity, Urine: 1.025 (ref 1.005–1.030)
pH: 6 (ref 5.0–8.0)

## 2022-08-06 MED ORDER — LEVOFLOXACIN 500 MG PO TABS: 500.0000 mg | ORAL_TABLET | Freq: Every day | ORAL | 0 refills | Status: DC

## 2022-08-06 NOTE — Discharge Instructions (Signed)
Follow-up with urology.  Take antibiotic as prescribed.  Recommend Tylenol and ibuprofen for pain.

## 2022-08-06 NOTE — ED Provider Notes (Signed)
[place] EMERGENCY DEPARTMENT AT [facility] Provider Note   CSN: [id number] Arrival date & time: 08/06/22  2121     History  Chief Complaint  Patient presents with   Testicle Pain    Arthur Moore is a 72 y.o. male.  Patient here with left-sided testicular pain for the last day or 2.  Denies any painful urination.  Denies any trauma.  Denies any fevers or chills.  He is in a monogamous relationship.  He is not having any discharge.  Denies any abdominal pain, nausea, vomiting, diarrhea.  The history is provided by the patient.       Home Medications Prior to Admission medications   Medication Sig Start Date End Date Taking? Authorizing Provider  levofloxacin (LEVAQUIN) 500 MG tablet Take 1 tablet (500 mg total) by mouth daily. 08/06/22  Yes Kariana Wiles, DO  albuterol (VENTOLIN HFA) 108 (90 Base) MCG/ACT inhaler Inhale 2 puffs into the lungs every 4 (four) hours as needed for wheezing or shortness of breath. 07/08/22   Kozlow, Alvira Philips, MD  aspirin EC 81 MG tablet Take 81 mg by mouth daily.    [provider]  budesonide (PULMICORT) 0.25 MG/2ML nebulizer solution Take 2 mLs (0.25 mg total) by nebulization every 4 (four) hours as needed (During flare ups). 07/08/22   Kozlow, Alvira Philips, MD  budesonide-formoterol (SYMBICORT) 160-4.5 MCG/ACT inhaler Inhale 2 puffs into the lungs 2 (two) times daily. 07/08/22   Kozlow, Alvira Philips, MD  cetirizine (ZYRTEC ALLERGY) 10 MG tablet Take 1 tablet (10 mg total) by mouth daily as needed (can take an ectra dose during flare ups). 12/20/21   Kozlow, Alvira Philips, MD  Cholecalciferol (VITAMIN D3) 5000 units TABS Take 5,000 Units by mouth daily.     [provider]  clonazePAM (KLONOPIN) 0.5 MG tablet Take 1 tablet (0.5 mg total) by mouth 3 (three) times daily as needed for anxiety. 05/09/22   Mozingo, Thereasa Solo, NP  fluticasone (FLONASE) 50 MCG/ACT nasal spray Place 2 sprays into both nostrils daily. 1-2 sprays each nostril 1  (ONE) time per day. 07/08/22   Kozlow, Alvira Philips, MD  ivabradine (CORLANOR) 5 MG TABS tablet TAKE 2 TABLETS BY MOUTH 2 HOURS PRIOR TO CT 06/19/22   Chilton Si, MD  lamoTRIgine (LAMICTAL) 200 MG tablet Take 1 tablet (200 mg total) by mouth 2 (two) times daily. 02/05/22   Mozingo, Thereasa Solo, NP  loratadine (CLARITIN) 10 MG tablet Take 1 tablet (10 mg total) by mouth daily as needed for allergies (Can take an extra dose during flare ups.). 07/08/22   Kozlow, Alvira Philips, MD  metoprolol tartrate (LOPRESSOR) 25 MG tablet TAKE 1/2 TABLET BY MOUTH TWICE A DAY 07/21/22   Chilton Si, MD  nitroGLYCERIN (NITROSTAT) 0.4 MG SL tablet Place 1 tablet (0.4 mg total) under the tongue every 5 (five) minutes as needed for chest pain. 07/11/22 10/09/22  Alver Sorrow, NP  pantoprazole (PROTONIX) 40 MG tablet Take 1 tablet (40 mg total) by mouth 2 (two) times daily. Take 1 tablet by mouth 1-2 times per day. 07/08/22   Kozlow, Alvira Philips, MD  rosuvastatin (CRESTOR) 10 MG tablet Take 1 tablet by mouth daily.    [provider]  Spacer/Aero-Holding Chambers DEVI 1 Device by Does not apply route as directed. 07/08/22   Kozlow, Alvira Philips, MD  zinc gluconate 50 MG tablet Take 1 tablet (50 mg total) by mouth daily. 07/26/18   Jodelle Gross, NP  Allergies    Atorvastatin and Zolpidem    Review of Systems   Review of Systems  Physical Exam Updated Vital Signs BP (!) 141/83 (BP Location: Right Arm)   Pulse 72   Temp 98 F (36.7 C)   Resp 18   Ht 6' (1.829 m)   Wt 83.9 kg   SpO2 95%   BMI 25.09 kg/m  Physical Exam Vitals and nursing note reviewed.  Constitutional:      General: He is not in acute distress.    Appearance: He is well-developed.  HENT:     Head: Normocephalic and atraumatic.  Eyes:     Conjunctiva/sclera: Conjunctivae normal.  Cardiovascular:     Rate and Rhythm: Normal rate and regular rhythm.     Heart sounds: No murmur heard. Pulmonary:     Effort: Pulmonary effort is  normal. No respiratory distress.     Breath sounds: Normal breath sounds.  Abdominal:     Palpations: Abdomen is soft.     Tenderness: There is no abdominal tenderness.  Genitourinary:    Comments: Tenderness to the left testicle, epididymis but there is no obvious swelling or irregularity to the testicles bilaterally otherwise Musculoskeletal:        General: No swelling.     Cervical back: Neck supple.  Skin:    General: Skin is warm and dry.     Capillary Refill: Capillary refill takes less than 2 seconds.  Neurological:     Mental Status: He is alert.  Psychiatric:        Mood and Affect: Mood normal.     ED Results / Procedures / Treatments   Labs (all labs ordered are listed, but only abnormal results are displayed) Labs Reviewed  URINALYSIS, ROUTINE W REFLEX MICROSCOPIC - Abnormal; Notable for the following components:      Result Value   Hgb urine dipstick SMALL (*)    All other components within normal limits  URINALYSIS, MICROSCOPIC (REFLEX) - Abnormal; Notable for the following components:   Bacteria, UA RARE (*)    All other components within normal limits    EKG None  Radiology US SCROTUM W/DOPPLER  Result Date: 08/06/2022 CLINICAL DATA:  Left-sided testicular pain for 2 days, initial encounter EXAM: SCROTAL ULTRASOUND DOPPLER ULTRASOUND OF THE TESTICLES TECHNIQUE: Complete ultrasound examination of the testicles, epididymis, and other scrotal structures was performed. Color and spectral Doppler ultrasound were also utilized to evaluate blood flow to the testicles. COMPARISON:  None Available. FINDINGS: Right testicle Measurements: 3.8 x 1.8 x 2.6 cm. No mass or microlithiasis visualized. Left testicle Measurements: 3.3 x 1.7 x 2.4 cm. Small testicular cysts are noted. Right epididymis: 12 mm right epididymal cyst is noted. Left epididymis: Mildly prominent with increased vascularity consistent with epididymitis. 6 mm epididymal cyst is noted. Hydrocele:  Small  left hydrocele is noted. Varicocele:  None visualized. Pulsed Doppler interrogation of both testes demonstrates normal low resistance arterial and venous waveforms bilaterally. IMPRESSION: Changes consistent with left-sided epididymitis. Testicular and epididymal cyst as described. No evidence of torsion. Electronically Signed   By: Alcide CleverMark  Lukens M.D.   On: 08/06/2022 22:38    Procedures Procedures    Medications Ordered in ED Medications - No data to display  ED Course/ Medical Decision Making/ A&P                             Medical Decision Making Amount and/or Complexity of Data Reviewed  Labs: ordered. Radiology: ordered.  Risk Prescription drug management.   Thorin Rutland is here with left testicular pain.  Normal vitals.  No fever.  He is mildly tender left testicle.  Overall there is no obvious abscess or major swelling.  Differential diagnosis likely epididymitis versus orchitis and less likely torsion.  Urinalysis was negative.  No infectious process in the urine.  Ultrasound was ordered and per radiology report patient has epididymitis of the left.  No torsion.  Overall we will treat with Levaquin.  He is in a monogamous relationship.  He has no concern for STDs.  Will have him follow-up with urology.  Discharged in good condition.  Recommend supportive care with Tylenol and ibuprofen and supportive underwear as well.  This chart was dictated using voice recognition software.  Despite best efforts to proofread,  errors can occur which can change the documentation meaning.         Final Clinical Impression(s) / ED Diagnoses Final diagnoses:  Epididymitis    Rx / DC Orders ED Discharge Orders          Ordered    levofloxacin (LEVAQUIN) 500 MG tablet  Daily        08/06/22 2253              Virgina Norfolk, DO 08/06/22 2257

## 2022-08-06 NOTE — ED Triage Notes (Signed)
Pt c/o LT testicular pain x 2d; pain is worsening and now radiating into lower abd

## 2022-08-12 DIAGNOSIS — J3089 Other allergic rhinitis: Secondary | ICD-10-CM | POA: Diagnosis not present

## 2022-08-12 NOTE — Progress Notes (Signed)
VIALS EXP 08-12-23 

## 2022-08-13 DIAGNOSIS — J302 Other seasonal allergic rhinitis: Secondary | ICD-10-CM | POA: Diagnosis not present

## 2022-08-14 DIAGNOSIS — J3081 Allergic rhinitis due to animal (cat) (dog) hair and dander: Secondary | ICD-10-CM | POA: Diagnosis not present

## 2022-08-23 ENCOUNTER — Encounter (HOSPITAL_BASED_OUTPATIENT_CLINIC_OR_DEPARTMENT_OTHER): Payer: Self-pay | Admitting: Emergency Medicine

## 2022-08-23 ENCOUNTER — Emergency Department (HOSPITAL_BASED_OUTPATIENT_CLINIC_OR_DEPARTMENT_OTHER)
Admission: EM | Admit: 2022-08-23 | Discharge: 2022-08-23 | Disposition: A | Discharge status: Home / Self Care | Payer: Medicare Other | Attending: Emergency Medicine | Admitting: Emergency Medicine

## 2022-08-23 ENCOUNTER — Emergency Department (HOSPITAL_BASED_OUTPATIENT_CLINIC_OR_DEPARTMENT_OTHER): Payer: Medicare Other

## 2022-08-23 ENCOUNTER — Other Ambulatory Visit: Payer: Self-pay

## 2022-08-23 DIAGNOSIS — J45909 Unspecified asthma, uncomplicated: Secondary | ICD-10-CM | POA: Diagnosis not present

## 2022-08-23 DIAGNOSIS — N50812 Left testicular pain: Secondary | ICD-10-CM | POA: Diagnosis present

## 2022-08-23 DIAGNOSIS — I129 Hypertensive chronic kidney disease with stage 1 through stage 4 chronic kidney disease, or unspecified chronic kidney disease: Secondary | ICD-10-CM | POA: Insufficient documentation

## 2022-08-23 DIAGNOSIS — N433 Hydrocele, unspecified: Secondary | ICD-10-CM | POA: Diagnosis not present

## 2022-08-23 DIAGNOSIS — N451 Epididymitis: Secondary | ICD-10-CM | POA: Insufficient documentation

## 2022-08-23 DIAGNOSIS — N189 Chronic kidney disease, unspecified: Secondary | ICD-10-CM | POA: Insufficient documentation

## 2022-08-23 DIAGNOSIS — N281 Cyst of kidney, acquired: Secondary | ICD-10-CM | POA: Diagnosis not present

## 2022-08-23 LAB — URINALYSIS, MICROSCOPIC (REFLEX)

## 2022-08-23 LAB — URINALYSIS, ROUTINE W REFLEX MICROSCOPIC
Bilirubin Urine: NEGATIVE
Glucose, UA: NEGATIVE mg/dL
Ketones, ur: NEGATIVE mg/dL
Leukocytes,Ua: NEGATIVE
Nitrite: NEGATIVE
Protein, ur: >=300 mg/dL — AB
Specific Gravity, Urine: >=1.03 (ref 1.005–1.030)
pH: 6 (ref 5.0–8.0)

## 2022-08-23 MED ORDER — LEVOFLOXACIN 500 MG PO TABS: 500.0000 mg | ORAL_TABLET | Freq: Every day | ORAL | 0 refills | Status: AC

## 2022-08-23 NOTE — Discharge Instructions (Addendum)
We evaluated you for your testicular pain.  Your ultrasound shows continued epididymitis.  We we will give you an additional course of antibiotics.  Please follow-up with [facility] to make sure that your infection is improving.

## 2022-08-23 NOTE — ED Provider Notes (Signed)
[place] EMERGENCY DEPARTMENT AT [facility] Provider Note  CSN: [id number] Arrival date & time: 08/23/22 1503  Chief Complaint(s) Testicle Pain  HPI Arthur Moore is a 72 y.o. male with history of hypertension, hyperlipidemia, CKD, psoriatic arthritis presenting to the emergency department with left testicle pain.  He also reports some pain in the right testicle.  Reports this began yesterday.  Feels similar to when he was here earlier this month for testicular pain and diagnosed with epididymitis.  No trauma to the testicles.  No fevers or chills.  No abdominal pain or back pain.  No chest pain.  He reports he was compliant with earlier course of Levaquin and completed with improvement in symptoms.  Has not yet followed up with urology.   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   CAD (coronary artery disease) 07/09/2018   CKD (chronic kidney disease), 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   Chest pain of uncertain etiology 07/08/2018   Chronic cough 06/21/2018   Thrombocytopenia (HCC) 12/01/2016   Transaminitis 12/01/2016   Diastolic dysfunction 06/20/2016   MDD (major depressive disorder), recurrent severe, 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 Authorizing Provider  levofloxacin (LEVAQUIN) 500 MG tablet Take 1 tablet (500 mg total) by mouth daily for 14 days. 08/23/22 09/06/22 Yes Lonell Grandchild, MD  albuterol (VENTOLIN HFA) 108 (90 Base) MCG/ACT inhaler Inhale 2 puffs into the lungs every 4 (four) hours as needed for wheezing or shortness of breath. 07/08/22   Kozlow, Alvira Philips, MD  aspirin EC 81 MG tablet Take 81 mg by mouth daily.    [provider]  budesonide (PULMICORT) 0.25 MG/2ML nebulizer solution Take 2 mLs (0.25 mg total) by nebulization every 4 (four) hours as needed (During flare ups). 07/08/22   Kozlow, Alvira Philips, MD  budesonide-formoterol (SYMBICORT) 160-4.5 MCG/ACT inhaler Inhale 2 puffs into the lungs 2 (two) times daily. 07/08/22   Kozlow, Alvira Philips, MD  cetirizine (ZYRTEC ALLERGY) 10 MG tablet Take 1 tablet (10 mg total) by mouth daily as needed (can take an ectra dose during flare ups). 12/20/21   Kozlow, Alvira Philips, MD  Cholecalciferol (VITAMIN D3) 5000 units TABS Take 5,000 Units by mouth daily.     [provider]  clonazePAM (KLONOPIN) 0.5 MG tablet Take 1 tablet (0.5 mg total) by mouth 3 (three) times daily as needed for anxiety. 05/09/22   Mozingo, Thereasa Solo, NP  fluticasone (FLONASE) 50 MCG/ACT nasal spray Place 2 sprays into both  nostrils daily. 1-2 sprays each nostril 1 (ONE) time per day. 07/08/22   Kozlow, Alvira Philips, MD  ivabradine (CORLANOR) 5 MG TABS tablet TAKE 2 TABLETS BY MOUTH 2 HOURS PRIOR TO CT 06/19/22   Chilton Si, MD  lamoTRIgine (LAMICTAL) 200 MG tablet Take 1 tablet (200 mg total) by mouth 2 (two) times daily. 02/05/22   Mozingo, Thereasa Solo, NP  loratadine (CLARITIN) 10 MG tablet Take 1 tablet (10 mg total) by mouth daily as needed for allergies (Can take an extra dose during  flare ups.). 07/08/22   Kozlow, Alvira Philips, MD  metoprolol tartrate (LOPRESSOR) 25 MG tablet TAKE 1/2 TABLET BY MOUTH TWICE A DAY 07/21/22   Chilton Si, MD  nitroGLYCERIN (NITROSTAT) 0.4 MG SL tablet Place 1 tablet (0.4 mg total) under the tongue every 5 (five) minutes as needed for chest pain. 07/11/22 10/09/22  Alver Sorrow, NP  pantoprazole (PROTONIX) 40 MG tablet Take 1 tablet (40 mg total) by mouth 2 (two) times daily. Take 1 tablet by mouth 1-2 times per day. 07/08/22   Kozlow, Alvira Philips, MD  rosuvastatin (CRESTOR) 10 MG tablet Take 1 tablet by mouth daily.    [provider]  Spacer/Aero-Holding Chambers DEVI 1 Device by Does not apply route as directed. 07/08/22   Kozlow, Alvira Philips, MD  zinc gluconate 50 MG tablet Take 1 tablet (50 mg total) by mouth daily. 07/26/18   Jodelle Gross, NP                                                                                                                                    Past Surgical History Past Surgical History:  Procedure Laterality Date   CHOLECYSTECTOMY  2012   HERNIA REPAIR     KNEE ARTHROSCOPY W/ MENISCAL REPAIR  11/2021   LEFT HEART CATH AND CORONARY ANGIOGRAPHY N/A 07/08/2018   Procedure: LEFT HEART CATH AND CORONARY ANGIOGRAPHY;  Surgeon: Kathleene Hazel, MD;  Location: [facility];  Service: Cardiovascular;  Laterality: N/A;   PAROTIDECTOMY Right 1992   PROSTATE SURGERY  2002, 2004   SINOSCOPY     SINUS EXPLORATION  2013   SPINE SURGERY  2013   L C7-T1 laminectomy and discectomy   TONSILLECTOMY     Family History Family History  Problem Relation Age of Onset   Colon cancer Mother    Lung cancer Father    Cancer Other    Bipolar disorder Brother    Allergic rhinitis Neg Hx    Angioedema Neg Hx    Asthma Neg Hx    Eczema Neg Hx     Social History Social History   Tobacco Use   Smoking status: Never   Smokeless tobacco: Never  Vaping Use   Vaping Use: Never used  Substance Use Topics    Alcohol use: Not Currently   Drug use: No   Allergies Atorvastatin and Zolpidem  Review of Systems Review of Systems  All other systems reviewed and are negative.   Physical Exam Vital Signs  I have reviewed the triage vital signs BP 128/61 (BP Location: Left Arm)   Pulse 64   Temp (!) 97.5 F (36.4 C) (Oral)   Resp 19   Ht 5' 11.5" (1.816 m)   Wt 84.4 kg   SpO2 95%   BMI 25.58 kg/m  Physical Exam Vitals and nursing note reviewed. Exam conducted with a chaperone present.  Constitutional:      General: He is not in acute distress.    Appearance: Normal appearance.  HENT:     Head: Normocephalic and atraumatic.     Mouth/Throat:     Mouth: Mucous membranes are moist.  Eyes:     Conjunctiva/sclera: Conjunctivae normal.  Cardiovascular:     Rate and Rhythm: Normal rate.  Pulmonary:     Effort: Pulmonary effort is normal. No respiratory distress.  Abdominal:     General: Abdomen is flat.  Genitourinary:    Comments: Erythema and mild warmth to scrotum, tenderness to left testicle/epididymis  Skin:    General: Skin is warm and dry.     Capillary Refill: Capillary refill takes less than 2 seconds.  Neurological:     General: No focal deficit present.     Mental Status: He is alert. Mental status is at baseline.  Psychiatric:        Mood and Affect: Mood normal.        Behavior: Behavior normal.     ED Results and Treatments Labs (all labs ordered are listed, but only abnormal results are displayed) Labs Reviewed  URINALYSIS, ROUTINE W REFLEX MICROSCOPIC - Abnormal; Notable for the following components:      Result Value   APPearance HAZY (*)    Hgb urine dipstick SMALL (*)    Protein, ur >=300 (*)    All other components within normal limits  URINALYSIS, MICROSCOPIC (REFLEX) - Abnormal; Notable for the following components:   Bacteria, UA MANY (*)    All other components within normal limits  URINE CULTURE  GC/CHLAMYDIA PROBE AMP ([place]) NOT AT [facility]                                                                                                                           Radiology US SCROTUM W/DOPPLER  Result Date: 08/23/2022 CLINICAL DATA:  Testicular pain, left-greater-than-right, recent epididymitis by ultrasound EXAM: SCROTAL ULTRASOUND DOPPLER ULTRASOUND OF THE TESTICLES TECHNIQUE: Complete ultrasound examination of the testicles, epididymis, and other scrotal structures was performed. Color and spectral Doppler ultrasound were also utilized to evaluate blood flow to the testicles. COMPARISON:  08/06/2022 FINDINGS: Right testicle Measurements: 3.3 x 1.6 x 2.3 cm. No mass or microlithiasis visualized. Left testicle Measurements: 3.8 x 1.8 x 2.6 cm. No mass or microlithiasis visualized. Tiny benign subcentimeter cyst or spermatocele, for which no further follow-up or characterization is required. Right epididymis: Normal in  size and appearance. Benign subcentimeter cyst or spermatocele, no further follow-up or characterization is required. Left epididymis: Normal in size. Mildly heterogeneous left epididymal head. Benign subcentimeter cyst or spermatocele, no further follow-up or characterization is required. Hydrocele:  Small bilateral hydroceles. Varicocele:  None visualized. Pulsed Doppler interrogation of both testes demonstrates normal low resistance arterial and venous waveforms bilaterally. IMPRESSION: 1. Mildly heterogeneous left epididymal head, consistent with resolving epididymitis seen on prior examination. 2. No evidence of testicular torsion or mass. 3. Small bilateral hydroceles. Electronically Signed   By: Jearld Lesch M.D.   On: 08/23/2022 17:32    Pertinent labs & imaging results that were available during my care of the patient were reviewed by me and considered in my medical decision making (see MDM for details).  Medications Ordered in ED Medications - No data to display                                                                                                                                    Procedures Procedures  (including critical care time)  Medical Decision Making / ED Course   MDM:  72 year old male presenting to the emergency department with testicular pain.  Patient well-appearing, vitals reassuring.  No fever.  Exam does have erythema to the testicle and tenderness to the left testicle.  Ultrasound shows epididymitis without sign of torsion or abscess.  Will resume course of Levaquin, discussed that I recommend following up with his urologist given persistent symptoms.  Will add on urine culture and gonorrhea chlamydia probe although patient reports monogamous with wife.      Additional history obtained:  -External records from outside source obtained and reviewed including: Chart review including previous notes, labs, imaging, consultation notes including ED visit a few days ago   Lab Tests: -I ordered, reviewed, and interpreted labs.   The pertinent results include:   Labs Reviewed  URINALYSIS, ROUTINE W REFLEX MICROSCOPIC - Abnormal; Notable for the following components:      Result Value   APPearance HAZY (*)    Hgb urine dipstick SMALL (*)    Protein, ur >=300 (*)    All other components within normal limits  URINALYSIS, MICROSCOPIC (REFLEX) - Abnormal; Notable for the following components:   Bacteria, UA MANY (*)    All other components within normal limits  URINE CULTURE  GC/CHLAMYDIA PROBE AMP ([place]) NOT AT [facility]    Notable for bacturia  Imaging Studies ordered: I ordered imaging studies including US scrotum On my interpretation imaging demonstrates epididymitis  I independently visualized and interpreted imaging. I agree with the radiologist interpretation   Medicines ordered and prescription drug management: Meds ordered this encounter  Medications   levofloxacin (LEVAQUIN) 500 MG tablet    Sig: Take 1 tablet (500 mg total) by mouth daily for 14 days.     Dispense:  14 tablet    Refill:  0    -  I have reviewed the patients home medicines and have made adjustments as needed    Cardiac Monitoring: The patient was maintained on a cardiac monitor.  I personally viewed and interpreted the cardiac monitored which showed an underlying rhythm of: NSR  Reevaluation: After the interventions noted above, I reevaluated the patient and found that their symptoms have improved  Co morbidities that complicate the patient evaluation  Past Medical History:  Diagnosis Date   Aortic insufficiency    Arthritis    Asthma    Carotid bruit 07/09/2021   Cervical disc disease    Depression    Diastolic dysfunction 06/20/2016   Hyperlipidemia 08/24/2015   Hypertension    Lumbar disc disease    Psoriasis       Dispostion: Disposition decision including need for hospitalization was considered, and patient discharged from emergency department.    Final Clinical Impression(s) / ED Diagnoses Final diagnoses:  Epididymitis     This chart was dictated using voice recognition software.  Despite best efforts to proofread,  errors can occur which can change the documentation meaning.    Lonell Grandchild, MD 08/23/22 [phone]

## 2022-08-23 NOTE — ED Triage Notes (Signed)
Pt reports recurrence of LT testicular pain; recent epididymitis dx; now has some pain on the RT as well, but LT is worse; reports pain started back after ejaculating yesterday

## 2022-08-24 LAB — URINE CULTURE

## 2022-08-25 LAB — URINE CULTURE: Culture: 20000 — AB

## 2022-08-25 LAB — GC/CHLAMYDIA PROBE AMP (~~LOC~~) NOT AT ARMC
Chlamydia: NEGATIVE
Comment: NEGATIVE
Comment: NORMAL
Neisseria Gonorrhea: NEGATIVE

## 2022-08-26 ENCOUNTER — Telehealth (HOSPITAL_BASED_OUTPATIENT_CLINIC_OR_DEPARTMENT_OTHER): Payer: Self-pay | Admitting: *Deleted

## 2022-08-26 DIAGNOSIS — B078 Other viral warts: Secondary | ICD-10-CM | POA: Diagnosis not present

## 2022-08-26 DIAGNOSIS — B070 Plantar wart: Secondary | ICD-10-CM | POA: Diagnosis not present

## 2022-08-26 NOTE — Telephone Encounter (Signed)
Post ED Visit - Positive Culture Follow-up: Successful Patient Follow-Up  Culture assessed and recommendations reviewed by:  []  Enzo Bi, Pharm.D. []  Celedonio Miyamoto, Pharm.D., BCPS AQ-ID []  Garvin Fila, Pharm.D., BCPS []  Georgina Pillion, Pharm.D., BCPS []  [place], [place].D., BCPS, AAHIVP []  Estella Husk, Pharm.D., BCPS, AAHIVP []  Lysle Pearl, PharmD, BCPS []  Phillips Climes, PharmD, BCPS []  Agapito Games, PharmD, BCPS []  Verlan Friends, PharmD  Positive urine culture  []  Patient discharged without antimicrobial prescription and treatment is now indicated [x]  Organism is resistant to prescribed ED discharge antimicrobial []  Patient with positive blood cultures  Changes discussed with ED provider: Benjiman Core, MD New antibiotic prescription Sulfamethoxazole/Trimethoprim 1DS tablet PO BID x 10 days Called to [employer] [phone]  Contacted patient, date 08/26/2022, time 1100   Lysle Pearl 08/26/2022, 11:07 AM

## 2022-08-26 NOTE — Progress Notes (Signed)
ED Antimicrobial Stewardship Positive Culture Follow Up   Arthur Moore is an 72 y.o. male who presented to [facility] on 08/23/2022 with a chief complaint of  Chief Complaint  Patient presents with   Testicle Pain    Recent Results (from the past 720 hour(s))  Urine Culture     Status: Abnormal   Collection Time: 08/23/22  3:57 PM   Specimen: Urine, Clean Catch  Result Value Ref Range Status   Specimen Description   Final    URINE, CLEAN CATCH Performed at [facility], [address] [place], [place] [address]    Special Requests   Final    NONE Performed at [facility], [address] [place], [place] [address]    Culture (A)  Final    20,000 COLONIES/mL ESCHERICHIA COLI Confirmed Extended Spectrum Beta-Lactamase Producer (ESBL).  In bloodstream infections from ESBL organisms, carbapenems are preferred over piperacillin/tazobactam. They are shown to have a lower risk of mortality.    Report Status 08/25/2022 FINAL  Final   Organism ID, Bacteria ESCHERICHIA COLI (A)  Final      Susceptibility   Escherichia coli - MIC*    AMPICILLIN >=32 RESISTANT Resistant     CEFAZOLIN >=64 RESISTANT Resistant     CEFEPIME >=32 RESISTANT Resistant     CEFTRIAXONE >=64 RESISTANT Resistant     CIPROFLOXACIN >=4 RESISTANT Resistant     GENTAMICIN >=16 RESISTANT Resistant     IMIPENEM <=0.25 SENSITIVE Sensitive     NITROFURANTOIN <=16 SENSITIVE Sensitive     TRIMETH/SULFA <=20 SENSITIVE Sensitive     AMPICILLIN/SULBACTAM >=32 RESISTANT Resistant     PIP/TAZO 8 SENSITIVE Sensitive     * 20,000 COLONIES/mL ESCHERICHIA COLI    [x]  Treated with levofloxacin, organism resistant to prescribed antimicrobial  New antibiotic prescription: Stop levofloxacin. Start sulfamethoxazole/trimethoprim 1 DS tablet PO BID x 10 days.  ED Provider: Benjiman Core, MD   Susa Griffins Dohlen 08/26/2022, 7:57 AM Clinical Pharmacist Monday - Friday phone -  [phone] Saturday  - Sunday phone - [phone]

## 2022-09-02 ENCOUNTER — Ambulatory Visit (INDEPENDENT_AMBULATORY_CARE_PROVIDER_SITE_OTHER): Payer: Medicare Other

## 2022-09-02 DIAGNOSIS — R350 Frequency of micturition: Secondary | ICD-10-CM | POA: Diagnosis not present

## 2022-09-02 DIAGNOSIS — J309 Allergic rhinitis, unspecified: Secondary | ICD-10-CM | POA: Diagnosis not present

## 2022-09-02 DIAGNOSIS — N451 Epididymitis: Secondary | ICD-10-CM | POA: Diagnosis not present

## 2022-09-04 ENCOUNTER — Ambulatory Visit: Payer: Medicare Other | Admitting: Podiatry

## 2022-09-07 ENCOUNTER — Emergency Department (HOSPITAL_BASED_OUTPATIENT_CLINIC_OR_DEPARTMENT_OTHER)
Admission: EM | Admit: 2022-09-07 | Discharge: 2022-09-07 | Disposition: A | Discharge status: Home / Self Care | Payer: Medicare Other | Attending: Emergency Medicine | Admitting: Emergency Medicine

## 2022-09-07 ENCOUNTER — Encounter (HOSPITAL_BASED_OUTPATIENT_CLINIC_OR_DEPARTMENT_OTHER): Payer: Self-pay | Admitting: Emergency Medicine

## 2022-09-07 ENCOUNTER — Emergency Department (HOSPITAL_BASED_OUTPATIENT_CLINIC_OR_DEPARTMENT_OTHER): Payer: Medicare Other

## 2022-09-07 ENCOUNTER — Other Ambulatory Visit: Payer: Self-pay

## 2022-09-07 DIAGNOSIS — I2510 Atherosclerotic heart disease of native coronary artery without angina pectoris: Secondary | ICD-10-CM | POA: Insufficient documentation

## 2022-09-07 DIAGNOSIS — W19XXXA Unspecified fall, initial encounter: Secondary | ICD-10-CM

## 2022-09-07 DIAGNOSIS — J45909 Unspecified asthma, uncomplicated: Secondary | ICD-10-CM | POA: Insufficient documentation

## 2022-09-07 DIAGNOSIS — Z79899 Other long term (current) drug therapy: Secondary | ICD-10-CM | POA: Diagnosis not present

## 2022-09-07 DIAGNOSIS — I10 Essential (primary) hypertension: Secondary | ICD-10-CM | POA: Insufficient documentation

## 2022-09-07 DIAGNOSIS — M5416 Radiculopathy, lumbar region: Secondary | ICD-10-CM | POA: Diagnosis not present

## 2022-09-07 DIAGNOSIS — Z7951 Long term (current) use of inhaled steroids: Secondary | ICD-10-CM | POA: Insufficient documentation

## 2022-09-07 DIAGNOSIS — Z7982 Long term (current) use of aspirin: Secondary | ICD-10-CM | POA: Diagnosis not present

## 2022-09-07 DIAGNOSIS — W1839XA Other fall on same level, initial encounter: Secondary | ICD-10-CM | POA: Diagnosis not present

## 2022-09-07 DIAGNOSIS — M545 Low back pain, unspecified: Secondary | ICD-10-CM | POA: Diagnosis not present

## 2022-09-07 DIAGNOSIS — Y9373 Activity, racquet and hand sports: Secondary | ICD-10-CM | POA: Diagnosis not present

## 2022-09-07 DIAGNOSIS — M5410 Radiculopathy, site unspecified: Secondary | ICD-10-CM

## 2022-09-07 MED ORDER — LIDOCAINE 5 % EX PTCH: 1.0000 | MEDICATED_PATCH | CUTANEOUS | 0 refills | Status: DC

## 2022-09-07 MED ORDER — CYCLOBENZAPRINE HCL 10 MG PO TABS: 10.0000 mg | ORAL_TABLET | Freq: Two times a day (BID) | ORAL | 0 refills | Status: DC | PRN

## 2022-09-07 MED ORDER — METHYLPREDNISOLONE 4 MG PO TBPK: ORAL_TABLET | ORAL | 0 refills | Status: DC

## 2022-09-07 MED ORDER — HYDROMORPHONE HCL 1 MG/ML IJ SOLN
0.5000 mg | Freq: Once | INTRAMUSCULAR | Status: AC
  Administered 2022-09-07: 0.5 mg via INTRAMUSCULAR
  Filled 2022-09-07: qty 1

## 2022-09-07 MED ORDER — KETOROLAC TROMETHAMINE 60 MG/2ML IM SOLN
60.0000 mg | Freq: Once | INTRAMUSCULAR | Status: AC
  Administered 2022-09-07: 60 mg via INTRAMUSCULAR
  Filled 2022-09-07: qty 2

## 2022-09-07 NOTE — ED Notes (Signed)
Patient transported to X-ray 

## 2022-09-07 NOTE — ED Notes (Signed)
Pt ambulatory to room with independent steady gait 

## 2022-09-07 NOTE — Discharge Instructions (Addendum)
I recommend discussing why the urologist recommended no more than 2000mg  of tylenol per day for you and if there is not a clear reason, then can take maximum 3500-4000mg  per day.  I do recommend continuing naproxen for 3 more days for your pain in addition to taking tylenol, the muscle relaxant, lidoderm patch.  Please follow up with your PCP for continued discussion of pain control, PT, consideration of outpatient MRI and/or neurosurgery referral if pain continues.

## 2022-09-07 NOTE — ED Notes (Signed)
Dr. Schlossman at bedside. 

## 2022-09-07 NOTE — ED Notes (Signed)
Pt A&Ox4 verbalized understanding of d/c instructions, prescriptions and follow up care. Pt wheeled out of ED via wheelchair.

## 2022-09-07 NOTE — ED Triage Notes (Signed)
Pt reports fall while playing pickle ball today; fell backward, did not hit head; takes baby ASA; c/o lower back pain

## 2022-09-07 NOTE — ED Provider Notes (Addendum)
[facility] [facility] Provider Note   CSN: [id number] Arrival date & time: 09/07/22  1706     History  Chief Complaint  Patient presents with   Marletta Lor    Arthur Moore Eye is a 72 y.o. male.  HPI      72 year old male with a history of hypertension, hyperlipidemia, asthma, CAD, lumbar disc disease, who presents with concern for fall with lower back pain radiating down the left leg.   Reports he was playing pickle ball when he fell backwards, onto his buttock and then directly onto his lower back.  He did not hit his head, lose consciousness, denies any headache and is not on anticoagulation other than baby aspirin.  He denies any numbness, weakness, change in vision, facial droop, difficulty talking, difficulty walking, loss of control of his bowel or bladder.  Reports severe lower back pain with radiation down the left leg.  Denies any other injuries from this fall.  No fever, ivdu.  Past Medical History:  Diagnosis Date   Aortic insufficiency    Arthritis    Asthma    Carotid bruit 07/09/2021   Cervical disc disease    Depression    Diastolic dysfunction 06/20/2016   Hyperlipidemia 08/24/2015   Hypertension    Lumbar disc disease    Psoriasis     Home Medications Prior to Admission medications   Medication Sig Start Date End Date Taking? Authorizing Provider  cyclobenzaprine (FLEXERIL) 10 MG tablet Take 1 tablet (10 mg total) by mouth 2 (two) times daily as needed for muscle spasms. 09/07/22  Yes Alvira Monday, MD  lidocaine (LIDODERM) 5 % Place 1 patch onto the skin daily. Remove & Discard patch within 12 hours or as directed by MD 09/07/22  Yes Alvira Monday, MD  methylPREDNISolone (MEDROL DOSEPAK) 4 MG TBPK tablet See instructions 09/07/22  Yes Alvira Monday, MD  albuterol (VENTOLIN HFA) 108 (90 Base) MCG/ACT inhaler Inhale 2 puffs into the lungs every 4 (four) hours as needed for wheezing or shortness of breath. 07/08/22    Kozlow, Alvira Philips, MD  aspirin EC 81 MG tablet Take 81 mg by mouth daily.    [provider]  budesonide (PULMICORT) 0.25 MG/2ML nebulizer solution Take 2 mLs (0.25 mg total) by nebulization every 4 (four) hours as needed (During flare ups). 07/08/22   Kozlow, Alvira Philips, MD  budesonide-formoterol (SYMBICORT) 160-4.5 MCG/ACT inhaler Inhale 2 puffs into the lungs 2 (two) times daily. 07/08/22   Kozlow, Alvira Philips, MD  cetirizine (ZYRTEC ALLERGY) 10 MG tablet Take 1 tablet (10 mg total) by mouth daily as needed (can take an ectra dose during flare ups). 12/20/21   Kozlow, Alvira Philips, MD  Cholecalciferol (VITAMIN D3) 5000 units TABS Take 5,000 Units by mouth daily.     [provider]  clonazePAM (KLONOPIN) 0.5 MG tablet Take 1 tablet (0.5 mg total) by mouth 3 (three) times daily as needed for anxiety. 05/09/22   Mozingo, Thereasa Solo, NP  fluticasone (FLONASE) 50 MCG/ACT nasal spray Place 2 sprays into both nostrils daily. 1-2 sprays each nostril 1 (ONE) time per day. 07/08/22   Kozlow, Alvira Philips, MD  ivabradine (CORLANOR) 5 MG TABS tablet TAKE 2 TABLETS BY MOUTH 2 HOURS PRIOR TO CT 06/19/22   Chilton Si, MD  lamoTRIgine (LAMICTAL) 200 MG tablet Take 1 tablet (200 mg total) by mouth 2 (two) times daily. 02/05/22   Mozingo, Thereasa Solo, NP  loratadine (CLARITIN) 10 MG tablet Take 1 tablet (  10 mg total) by mouth daily as needed for allergies (Can take an extra dose during flare ups.). 07/08/22   Kozlow, Alvira Philips, MD  metoprolol tartrate (LOPRESSOR) 25 MG tablet TAKE 1/2 TABLET BY MOUTH TWICE A DAY 07/21/22   Chilton Si, MD  nitroGLYCERIN (NITROSTAT) 0.4 MG SL tablet Place 1 tablet (0.4 mg total) under the tongue every 5 (five) minutes as needed for chest pain. 07/11/22 10/09/22  Alver Sorrow, NP  pantoprazole (PROTONIX) 40 MG tablet Take 1 tablet (40 mg total) by mouth 2 (two) times daily. Take 1 tablet by mouth 1-2 times per day. 07/08/22   Kozlow, Alvira Philips, MD  rosuvastatin (CRESTOR) 10 MG  tablet Take 1 tablet by mouth daily.    [provider]  Spacer/Aero-Holding Chambers DEVI 1 Device by Does not apply route as directed. 07/08/22   Kozlow, Alvira Philips, MD  zinc gluconate 50 MG tablet Take 1 tablet (50 mg total) by mouth daily. 07/26/18   Jodelle Gross, NP      Allergies    Atorvastatin and Zolpidem    Review of Systems   Review of Systems  Physical Exam Updated Vital Signs BP 126/79   Pulse 65   Temp 98.3 F (36.8 C)   Resp 16   Ht 5' 11.5" (1.816 m)   Wt 84.4 kg   SpO2 94%   BMI 25.59 kg/m  Physical Exam Vitals and nursing note reviewed.  Constitutional:      General: He is not in acute distress.    Appearance: He is well-developed. He is not diaphoretic.  HENT:     Head: Normocephalic and atraumatic.  Eyes:     Conjunctiva/sclera: Conjunctivae normal.  Cardiovascular:     Rate and Rhythm: Normal rate and regular rhythm.     Heart sounds: Normal heart sounds. No murmur heard.    No friction rub. No gallop.  Pulmonary:     Effort: Pulmonary effort is normal. No respiratory distress.     Breath sounds: Normal breath sounds. No wheezing or rales.  Abdominal:     General: There is no distension.     Palpations: Abdomen is soft.     Tenderness: There is no abdominal tenderness. There is no guarding.  Musculoskeletal:        General: Tenderness (lower left lumbar) present.     Cervical back: Normal range of motion.  Skin:    General: Skin is warm and dry.  Neurological:     Mental Status: He is alert and oriented to person, place, and time.     Sensory: No sensory deficit (lower extremity).     Motor: No weakness (lower extremity).     ED Results / Procedures / Treatments   Labs (all labs ordered are listed, but only abnormal results are displayed) Labs Reviewed - No data to display  EKG None  Radiology DG Lumbar Spine Complete  Result Date: 09/07/2022 CLINICAL DATA:  Pain after fall EXAM: LUMBAR SPINE - COMPLETE 4+ VIEW  COMPARISON:  None Available. FINDINGS: Minimal anterior wedging of T12 and L1 is stable since April 14, 2021. No acute fracture. No malalignment. No other significant bony abnormalities. Calcified atherosclerotic changes identified in the abdominal aorta and iliac vessels. IMPRESSION: 1. No acute fracture or malalignment. 2. Calcified atherosclerotic changes in the abdominal aorta and iliac vessels. Electronically Signed   By: Gerome Sam III M.D.   On: 09/07/2022 18:07    Procedures Procedures    Medications Ordered in  ED Medications  HYDROmorphone (DILAUDID) injection 0.5 mg (0.5 mg Intramuscular Given 09/07/22 1834)  ketorolac (TORADOL) injection 60 mg (60 mg Intramuscular Given 09/07/22 1834)    ED Course/ Medical Decision Making/ A&P                               71 year old male with a history of hypertension, hyperlipidemia, asthma, CAD, lumbar disc disease, recent epididymitis, who presents with concern for fall with lower back pain radiating down the left leg.   Denies any head trauma, headache, or neurologic symptoms and have low suspicion for ICH.  No other signs of injuries by history and physical exam.  X-ray of the lumbar spine completed and personally about interpreted by me and radiology shows no acute fracture or malalignment.  He has normal pulses in bilateral lower extremities, low suspicion for acute arterial ischemia or dissection.  Suspect his radicular pain is secondary to disc herniation.  He has no numbness, weakness, loss of control of bowel or bladder, or other red flags of back pain, and do not see indication for emergency surgery or transfer at this time.  Recommend continued pain control, supportive care, follow-up with primary care physician and physical therapy.  He was given a prescription for Medrol Dosepak with discussion of the risks of hyperglycemia, he was given a prescription for lidocaine patch and Flexeril.  Recommend Tylenol, and naproxen. Patient  discharged in stable condition with understanding of reasons to return.         Final Clinical Impression(s) / ED Diagnoses Final diagnoses:  Fall, initial encounter  Radicular low back pain    Rx / DC Orders ED Discharge Orders          Ordered    cyclobenzaprine (FLEXERIL) 10 MG tablet  2 times daily PRN        09/07/22 1828    methylPREDNISolone (MEDROL DOSEPAK) 4 MG TBPK tablet        09/07/22 1828    lidocaine (LIDODERM) 5 %  Every 24 hours        09/07/22 1828              Alvira Monday, MD 09/07/22 1610    Alvira Monday, MD 09/07/22 [phone]

## 2022-09-10 ENCOUNTER — Ambulatory Visit (INDEPENDENT_AMBULATORY_CARE_PROVIDER_SITE_OTHER): Payer: Medicare Other | Admitting: Adult Health

## 2022-09-10 ENCOUNTER — Encounter: Payer: Self-pay | Admitting: Adult Health

## 2022-09-10 DIAGNOSIS — F4310 Post-traumatic stress disorder, unspecified: Secondary | ICD-10-CM | POA: Diagnosis not present

## 2022-09-10 DIAGNOSIS — F39 Unspecified mood [affective] disorder: Secondary | ICD-10-CM | POA: Diagnosis not present

## 2022-09-10 DIAGNOSIS — F411 Generalized anxiety disorder: Secondary | ICD-10-CM | POA: Diagnosis not present

## 2022-09-10 NOTE — Progress Notes (Signed)
Arthur Moore [id number] [birth year] 72 y.o.  Subjective:   Patient ID:  Arthur Moore is a 72 y.o. (DOB Aug 29, 1950) male.  Chief Complaint: No chief complaint on file.   HPI Arthur Moore presents to the office today for follow-up of Episodic Mood Disorder, PTSD, depression and GAD.  Accompanied by wife.  Describes mood today as "ok". Pleasant. Denies tearfulness. Denies depression - "a day here and there". Reports decreased anxiety - wanting to taper off the Clonazepam 0.5mg  daily. Reports decreased irritability. Denies recent panic attacks. Mood is consistent. Stating "I feel more even keeled than I have in a long time". Feels like Lamitcal is helpful for mood stabilization. Varying interest and motivation. Taking medications as prescribed.  Energy levels stable. Active, unable to exercise.  Enjoys some usual interests and activities. Married. Lives with wife - 4 children between them. Spending time with family. Appetite stable. Weight stable - 186 pounds. Sleeps well at night. Averages 9 to 10 hours.  Focus and concentration stable. Completing tasks. Managing aspects of household. Retired - [place] veteran. Denies SI or HI.  Denies AH or VH. Denies self harm. Denies substance use.  Previous medication trials: Lexapro, Cymbalta, Xanax, Lamictal, Wellbutrin, Effexor, Paxil, Abilify, Pristiq, Vraylar,   PHQ2-9    Flowsheet Row Nutrition from 06/11/2017 in [facility] Total Score 0      Flowsheet Row ED from 09/07/2022 in [facility] Emergency Department at [facility] ED from 08/23/2022 in [facility] Department at [facility] ED from 08/06/2022 in [facility] Emergency Department at [facility]  C-SSRS RISK CATEGORY No Risk No Risk No Risk        Review of Systems:  Review of Systems  Musculoskeletal:  Negative for gait problem.  Neurological:  Negative for tremors.   Psychiatric/Behavioral:         Please refer to HPI    Medications: I have reviewed the patient's current medications.  Current Outpatient Medications  Medication Sig Dispense Refill   albuterol (VENTOLIN HFA) 108 (90 Base) MCG/ACT inhaler Inhale 2 puffs into the lungs every 4 (four) hours as needed for wheezing or shortness of breath. 18 each 1   aspirin EC 81 MG tablet Take 81 mg by mouth daily.     budesonide (PULMICORT) 0.25 MG/2ML nebulizer solution Take 2 mLs (0.25 mg total) by nebulization every 4 (four) hours as needed (During flare ups). 150 mL 1   budesonide-formoterol (SYMBICORT) 160-4.5 MCG/ACT inhaler Inhale 2 puffs into the lungs 2 (two) times daily. 10.2 g 5   cetirizine (ZYRTEC ALLERGY) 10 MG tablet Take 1 tablet (10 mg total) by mouth daily as needed (can take an ectra dose during flare ups). 60 tablet 5   Cholecalciferol (VITAMIN D3) 5000 units TABS Take 5,000 Units by mouth daily.      clonazePAM (KLONOPIN) 0.5 MG tablet Take 1 tablet (0.5 mg total) by mouth 3 (three) times daily as needed for anxiety. 90 tablet 0   cyclobenzaprine (FLEXERIL) 10 MG tablet Take 1 tablet (10 mg total) by mouth 2 (two) times daily as needed for muscle spasms. 20 tablet 0   fluticasone (FLONASE) 50 MCG/ACT nasal spray Place 2 sprays into both nostrils daily. 1-2 sprays each nostril 1 (ONE) time per day. 16 g 5   ivabradine (CORLANOR) 5 MG TABS tablet TAKE 2 TABLETS BY MOUTH 2 HOURS PRIOR TO CT 2 tablet 0   lamoTRIgine (LAMICTAL) 200 MG tablet  Take 1 tablet (200 mg total) by mouth 2 (two) times daily. 180 tablet 3   lidocaine (LIDODERM) 5 % Place 1 patch onto the skin daily. Remove & Discard patch within 12 hours or as directed by MD 30 patch 0   loratadine (CLARITIN) 10 MG tablet Take 1 tablet (10 mg total) by mouth daily as needed for allergies (Can take an extra dose during flare ups.). 60 tablet 5   methylPREDNISolone (MEDROL DOSEPAK) 4 MG TBPK tablet See instructions 21 each 0   metoprolol  tartrate (LOPRESSOR) 25 MG tablet TAKE 1/2 TABLET BY MOUTH TWICE A DAY 90 tablet 3   nitroGLYCERIN (NITROSTAT) 0.4 MG SL tablet Place 1 tablet (0.4 mg total) under the tongue every 5 (five) minutes as needed for chest pain. 25 tablet 3   pantoprazole (PROTONIX) 40 MG tablet Take 1 tablet (40 mg total) by mouth 2 (two) times daily. Take 1 tablet by mouth 1-2 times per day. 180 tablet 1   rosuvastatin (CRESTOR) 10 MG tablet Take 1 tablet by mouth daily.     Spacer/Aero-Holding Chambers DEVI 1 Device by Does not apply route as directed. 1 each 1   zinc gluconate 50 MG tablet Take 1 tablet (50 mg total) by mouth daily.     No current facility-administered medications for this visit.    Medication Side Effects: None  Allergies:  Allergies  Allergen Reactions   Atorvastatin Other (See Comments)    Calf pain. Other reaction(s): leb numbness   Zolpidem Other (See Comments)    Other reaction(s): Drowsy    Past Medical History:  Diagnosis Date   Aortic insufficiency    Arthritis    Asthma    Carotid bruit 07/09/2021   Cervical disc disease    Depression    Diastolic dysfunction 06/20/2016   Hyperlipidemia 08/24/2015   Hypertension    Lumbar disc disease    Psoriasis     Past Medical History, Surgical history, Social history, and Family history were reviewed and updated as appropriate.   Please see review of systems for further details on the patient's review from today.   Objective:   Physical Exam:  There were no vitals taken for this visit.  Physical Exam Constitutional:      General: He is not in acute distress. Musculoskeletal:        General: No deformity.  Neurological:     Mental Status: He is alert and oriented to person, place, and time.     Coordination: Coordination normal.  Psychiatric:        Attention and Perception: Attention and perception normal. He does not perceive auditory or visual hallucinations.        Mood and Affect: Mood normal. Mood is not anxious  or depressed. Affect is not labile, blunt, angry or inappropriate.        Speech: Speech normal.        Behavior: Behavior normal.        Thought Content: Thought content normal. Thought content is not paranoid or delusional. Thought content does not include homicidal or suicidal ideation. Thought content does not include homicidal or suicidal plan.        Cognition and Memory: Cognition and memory normal.        Judgment: Judgment normal.     Comments: Insight intact     Lab Review:     Component Value Date/Time   NA 143 07/02/2022 1040   K 4.2 07/02/2022 1040   CL 108 (H)  07/02/2022 1040   CO2 20 07/02/2022 1040   GLUCOSE 153 (H) 07/02/2022 1040   GLUCOSE 113 (H) 03/21/2022 1524   BUN 17 07/02/2022 1040   CREATININE 1.25 07/02/2022 1040   CALCIUM 9.1 07/02/2022 1040   PROT 6.6 10/18/2020 1130   ALBUMIN 4.7 10/18/2020 1130   AST 20 10/18/2020 1130   ALT 21 10/18/2020 1130   ALKPHOS 89 10/18/2020 1130   BILITOT 0.7 10/18/2020 1130   GFRNONAA 58 (L) 03/21/2022 1524   GFRAA >60 01/01/2020 0324       Component Value Date/Time   WBC 8.1 03/21/2022 1524   RBC 5.18 03/21/2022 1524   HGB 15.7 03/21/2022 1524   HGB 16.7 12/09/2016 1537   HCT 45.1 03/21/2022 1524   HCT 46.9 12/09/2016 1537   PLT 139 (L) 03/21/2022 1524   PLT 141 12/09/2016 1537   MCV 87.1 03/21/2022 1524   MCV 87.3 12/09/2016 1537   MCH 30.3 03/21/2022 1524   MCHC 34.8 03/21/2022 1524   RDW 12.6 03/21/2022 1524   RDW 13.6 12/09/2016 1537   LYMPHSABS 1.6 03/10/2022 0111   LYMPHSABS 1.5 12/09/2016 1537   MONOABS 0.5 03/10/2022 0111   MONOABS 0.4 12/09/2016 1537   EOSABS 0.3 03/10/2022 0111   EOSABS 0.3 12/09/2016 1537   BASOSABS 0.0 03/10/2022 0111   BASOSABS 0.0 12/09/2016 1537    No results found for: "POCLITH", "LITHIUM"   No results found for: "PHENYTOIN", "PHENOBARB", "VALPROATE", "CBMZ"   .res Assessment: Plan:    Plan:  PDMP reviewed  Lamictal 200mg  twice daily Clonazepam 0.5mg  daily  - taper to 1/2 tablet for 2 weeks then use as needed.  Discussed Spravato treatment - information given.  Seeing therapist.  Plans to follow up with PCP for symptoms as well  RTC 3 months  Patient advised to contact office with any questions, adverse effects, or acute worsening in signs and symptoms.  Counseled patient regarding potential benefits, risks, and side effects of Lamictal to include potential risk of Stevens-Johnson syndrome. Advised patient to stop taking Lamictal and contact office immediately if rash develops and to seek urgent medical attention if rash is severe and/or spreading quickly.   There are no diagnoses linked to this encounter.   Please see After Visit Summary for patient specific instructions.  Future Appointments  Date Time Provider Department Center  12/31/2022  3:00 PM Chilton Si, MD [facility]  01/06/2023 10:30 AM Kozlow, Alvira Philips, MD [facility] None    No orders of the defined types were placed in this encounter.   -------------------------------

## 2022-09-11 ENCOUNTER — Ambulatory Visit: Payer: Medicare Other | Admitting: Adult Health

## 2022-09-12 DIAGNOSIS — D696 Thrombocytopenia, unspecified: Secondary | ICD-10-CM | POA: Diagnosis not present

## 2022-09-12 DIAGNOSIS — K7460 Unspecified cirrhosis of liver: Secondary | ICD-10-CM | POA: Diagnosis not present

## 2022-09-12 DIAGNOSIS — I10 Essential (primary) hypertension: Secondary | ICD-10-CM | POA: Diagnosis not present

## 2022-09-12 DIAGNOSIS — I25118 Atherosclerotic heart disease of native coronary artery with other forms of angina pectoris: Secondary | ICD-10-CM | POA: Diagnosis not present

## 2022-09-12 DIAGNOSIS — R7303 Prediabetes: Secondary | ICD-10-CM | POA: Diagnosis not present

## 2022-09-12 DIAGNOSIS — Z9289 Personal history of other medical treatment: Secondary | ICD-10-CM | POA: Diagnosis not present

## 2022-09-12 DIAGNOSIS — E559 Vitamin D deficiency, unspecified: Secondary | ICD-10-CM | POA: Diagnosis not present

## 2022-09-12 DIAGNOSIS — E7800 Pure hypercholesterolemia, unspecified: Secondary | ICD-10-CM | POA: Diagnosis not present

## 2022-09-12 DIAGNOSIS — I700 Atherosclerosis of aorta: Secondary | ICD-10-CM | POA: Diagnosis not present

## 2022-09-12 DIAGNOSIS — N451 Epididymitis: Secondary | ICD-10-CM | POA: Diagnosis not present

## 2022-09-18 DIAGNOSIS — W57XXXA Bitten or stung by nonvenomous insect and other nonvenomous arthropods, initial encounter: Secondary | ICD-10-CM | POA: Diagnosis not present

## 2022-09-18 DIAGNOSIS — S80862A Insect bite (nonvenomous), left lower leg, initial encounter: Secondary | ICD-10-CM | POA: Diagnosis not present

## 2022-09-30 ENCOUNTER — Ambulatory Visit (INDEPENDENT_AMBULATORY_CARE_PROVIDER_SITE_OTHER): Payer: Medicare Other | Admitting: *Deleted

## 2022-09-30 DIAGNOSIS — J309 Allergic rhinitis, unspecified: Secondary | ICD-10-CM | POA: Diagnosis not present

## 2022-10-06 ENCOUNTER — Other Ambulatory Visit: Payer: Self-pay | Admitting: Adult Health

## 2022-10-06 DIAGNOSIS — F411 Generalized anxiety disorder: Secondary | ICD-10-CM

## 2022-10-06 NOTE — Telephone Encounter (Signed)
Pended.

## 2022-10-06 NOTE — Telephone Encounter (Signed)
Patient lvm at 2:35 requesting refill for Clonazepam 0.5mg . States he needs a few more to wean himself off. Ph: [phone] Pharmacy [facility] [address] [place] Appt 8/14

## 2022-10-07 MED ORDER — CLONAZEPAM 0.5 MG PO TABS
0.5000 mg | ORAL_TABLET | Freq: Three times a day (TID) | ORAL | 0 refills | Status: DC | PRN
Start: 2022-10-07 — End: 2022-12-30

## 2022-10-21 DIAGNOSIS — L218 Other seborrheic dermatitis: Secondary | ICD-10-CM | POA: Diagnosis not present

## 2022-10-21 DIAGNOSIS — B078 Other viral warts: Secondary | ICD-10-CM | POA: Diagnosis not present

## 2022-10-27 ENCOUNTER — Other Ambulatory Visit: Payer: Self-pay | Admitting: Nurse Practitioner

## 2022-10-27 DIAGNOSIS — K7469 Other cirrhosis of liver: Secondary | ICD-10-CM | POA: Diagnosis not present

## 2022-10-27 DIAGNOSIS — E441 Mild protein-calorie malnutrition: Secondary | ICD-10-CM | POA: Diagnosis not present

## 2022-10-27 DIAGNOSIS — K7460 Unspecified cirrhosis of liver: Secondary | ICD-10-CM

## 2022-10-27 DIAGNOSIS — K766 Portal hypertension: Secondary | ICD-10-CM | POA: Diagnosis not present

## 2022-10-27 DIAGNOSIS — K7581 Nonalcoholic steatohepatitis (NASH): Secondary | ICD-10-CM | POA: Diagnosis not present

## 2022-10-28 ENCOUNTER — Ambulatory Visit (INDEPENDENT_AMBULATORY_CARE_PROVIDER_SITE_OTHER): Payer: Medicare Other

## 2022-10-28 DIAGNOSIS — J309 Allergic rhinitis, unspecified: Secondary | ICD-10-CM | POA: Diagnosis not present

## 2022-11-03 ENCOUNTER — Other Ambulatory Visit: Payer: Self-pay | Admitting: Adult Health

## 2022-11-03 DIAGNOSIS — K7460 Unspecified cirrhosis of liver: Secondary | ICD-10-CM | POA: Diagnosis not present

## 2022-11-05 ENCOUNTER — Other Ambulatory Visit: Payer: Self-pay | Admitting: Adult Health

## 2022-11-06 ENCOUNTER — Other Ambulatory Visit: Payer: Self-pay | Admitting: Adult Health

## 2022-11-07 ENCOUNTER — Ambulatory Visit (INDEPENDENT_AMBULATORY_CARE_PROVIDER_SITE_OTHER): Payer: Medicare Other

## 2022-11-07 DIAGNOSIS — J309 Allergic rhinitis, unspecified: Secondary | ICD-10-CM | POA: Diagnosis not present

## 2022-11-07 NOTE — Telephone Encounter (Signed)
Received a pharmacy RF request and it was addressed.

## 2022-11-07 NOTE — Telephone Encounter (Signed)
You discontinued lorazepam and he was on clonazepam. He said he finished his wean of clonazepam but is still having anxiety and is wanting lorazepam today. I can pend if ok. Last fill of lorazepam was in October. He has appt next month and he said he would definitely be here.

## 2022-11-07 NOTE — Telephone Encounter (Signed)
Patient called in for refill on Lorazepam 0.5mg . Ph: [phone] Appt 8/14 Pharmacy [facility] [address] [place]

## 2022-11-07 NOTE — Telephone Encounter (Signed)
That is fine 

## 2022-11-11 ENCOUNTER — Ambulatory Visit (INDEPENDENT_AMBULATORY_CARE_PROVIDER_SITE_OTHER): Payer: Medicare Other

## 2022-11-11 DIAGNOSIS — K7682 Hepatic encephalopathy: Secondary | ICD-10-CM | POA: Diagnosis not present

## 2022-11-11 DIAGNOSIS — J309 Allergic rhinitis, unspecified: Secondary | ICD-10-CM | POA: Diagnosis not present

## 2022-11-11 DIAGNOSIS — R634 Abnormal weight loss: Secondary | ICD-10-CM | POA: Diagnosis not present

## 2022-11-11 DIAGNOSIS — Z6823 Body mass index (BMI) 23.0-23.9, adult: Secondary | ICD-10-CM | POA: Diagnosis not present

## 2022-11-11 DIAGNOSIS — R413 Other amnesia: Secondary | ICD-10-CM | POA: Diagnosis not present

## 2022-11-12 ENCOUNTER — Other Ambulatory Visit: Payer: Self-pay | Admitting: Family Medicine

## 2022-11-12 DIAGNOSIS — R413 Other amnesia: Secondary | ICD-10-CM

## 2022-11-13 ENCOUNTER — Ambulatory Visit
Admission: RE | Admit: 2022-11-13 | Discharge: 2022-11-13 | Disposition: A | Discharge status: Home / Self Care | Payer: Medicare Other | Source: Ambulatory Visit | Attending: Nurse Practitioner | Admitting: Nurse Practitioner

## 2022-11-13 DIAGNOSIS — K7460 Unspecified cirrhosis of liver: Secondary | ICD-10-CM

## 2022-11-14 ENCOUNTER — Encounter: Payer: Self-pay | Admitting: Physician Assistant

## 2022-11-15 ENCOUNTER — Ambulatory Visit
Admission: RE | Admit: 2022-11-15 | Discharge: 2022-11-15 | Disposition: A | Discharge status: Home / Self Care | Payer: Medicare Other | Source: Ambulatory Visit | Attending: Family Medicine | Admitting: Family Medicine

## 2022-11-15 DIAGNOSIS — R413 Other amnesia: Secondary | ICD-10-CM

## 2022-11-20 ENCOUNTER — Ambulatory Visit (INDEPENDENT_AMBULATORY_CARE_PROVIDER_SITE_OTHER): Payer: Medicare Other

## 2022-11-20 DIAGNOSIS — B078 Other viral warts: Secondary | ICD-10-CM | POA: Diagnosis not present

## 2022-11-20 DIAGNOSIS — Z85828 Personal history of other malignant neoplasm of skin: Secondary | ICD-10-CM | POA: Diagnosis not present

## 2022-11-20 DIAGNOSIS — L821 Other seborrheic keratosis: Secondary | ICD-10-CM | POA: Diagnosis not present

## 2022-11-20 DIAGNOSIS — J309 Allergic rhinitis, unspecified: Secondary | ICD-10-CM

## 2022-11-20 DIAGNOSIS — L905 Scar conditions and fibrosis of skin: Secondary | ICD-10-CM | POA: Diagnosis not present

## 2022-11-20 DIAGNOSIS — L814 Other melanin hyperpigmentation: Secondary | ICD-10-CM | POA: Diagnosis not present

## 2022-11-20 DIAGNOSIS — D1801 Hemangioma of skin and subcutaneous tissue: Secondary | ICD-10-CM | POA: Diagnosis not present

## 2022-11-20 DIAGNOSIS — L578 Other skin changes due to chronic exposure to nonionizing radiation: Secondary | ICD-10-CM | POA: Diagnosis not present

## 2022-11-25 ENCOUNTER — Encounter: Payer: Self-pay | Admitting: Physician Assistant

## 2022-11-25 ENCOUNTER — Ambulatory Visit: Payer: Medicare Other

## 2022-11-25 ENCOUNTER — Ambulatory Visit (INDEPENDENT_AMBULATORY_CARE_PROVIDER_SITE_OTHER): Payer: Medicare Other | Admitting: Physician Assistant

## 2022-11-25 VITALS — BP 134/70 | HR 71 | Resp 18 | Ht 71.5 in | Wt 175.0 lb

## 2022-11-25 DIAGNOSIS — F3170 Bipolar disorder, currently in remission, most recent episode unspecified: Secondary | ICD-10-CM | POA: Diagnosis not present

## 2022-11-25 DIAGNOSIS — F411 Generalized anxiety disorder: Secondary | ICD-10-CM

## 2022-11-25 DIAGNOSIS — R413 Other amnesia: Secondary | ICD-10-CM | POA: Diagnosis not present

## 2022-11-25 DIAGNOSIS — F4312 Post-traumatic stress disorder, chronic: Secondary | ICD-10-CM | POA: Diagnosis not present

## 2022-11-25 DIAGNOSIS — F988 Other specified behavioral and emotional disorders with onset usually occurring in childhood and adolescence: Secondary | ICD-10-CM

## 2022-11-25 NOTE — Progress Notes (Signed)
Assessment/Plan:   Arthur Moore is a delightful  72 y.o. year old RH male with a history of hypertension, hyperlipidemia, BPH, vitamin D deficiency, insomnia, GERD, anxiety, PTSD, possible ADD per wife's report,  Bipolar disorder, history of colonic polyps, OSA unable to tolerate CPAP, decreased hearing, testosterone deficiency, prediabetes, history of TIA in 2013, history of fatty liver/cirrhosis, initially seen for memory complaints in September 2021, but without any concerns for memory impairment of neurological etiology at the time, likely of PTSD etiology, returning today for evaluation of memory complaints.  MoCA today is 24/30 . Patient is able to participate on his IADLs and continues to drive.  Workup is in progress.    Memory Impairment likely of multiple etiologies.   Await results of MRI brain ordered by PCP without contrast to assess for underlying structural abnormality and assess vascular load  Neurocognitive testing to further evaluate cognitive concerns and determine other underlying cause of memory changes, including potential contribution from sleep, anxiety, or depression  Recommend B12 supplementation (348) Continue to control mood as per PCP Recommend trauma focused treatment with psychiatry , will place referral  Recommend good control of cardiovascular risk factors Monitor hearing and sleep as per PCP [place] up in 3 months  Subjective:   The patient is accompanied by his wife who supplements the history.   How long did patient have memory difficulties? "For a while, about 1 year". Wife reports that recently he had made appt mistakes, not using nouns and verbs on his sentences so I have no idea what is he talking about". Denies trouble recognizing people that he know a . Does not use his hearing aids often. She feel that when he stopped them, the "memory problems began"-she says. He has 2 calendars  repeats oneself?  Endorsed Disoriented when walking into a  room?  Patient denies except occasionally not remembering what patient came to the room for    Leaving objects in unusual places? Misplaces keys, sunglasses despite being  in front of him.   Wandering behavior?  denies   Any personality changes?  For the last month, he had increasing anxiety, this was being followed by psychotherapy. Seeing a psychiatrist, but has retired so he is looking into a new provider. Any history of depression?:  Patient denies   Hallucinations or paranoia?  Patient denies   Seizures?   Patient denies    Any sleep changes? Sleeps well, unless has to get up to urinate. Sometimes he snores. Has some vivid dreams, Denies REM behavior or sleepwalking   Sleep apnea?  Patient denies   Any hygiene concerns?  Patient denies   Independent of bathing and dressing?  Endorsed  Does the patient needs help with medications? Patient is in charge   Who is in charge of the finances? Patient is in charge     Any changes in appetite?  Decreased appetite, with 20 pound weight loss in 1 month which is being evaluated by PCP and GI.    Patient have trouble swallowing? denies   Does the patient cook?  Yes, not as much after recently leaving the stove on the whole day.   Any kitchen accidents such as leaving the stove on? denies   Any headaches?   Denies  Chronic pain ? Arthritis in hands and knee, but still able to mobilize  Ambulates with difficulty?  denies , plays pickle ball   Recent falls or head injuries? She hit his head twice, once fell on concrete, and  once with a heavy piece fell on his head when working with the generator .  Vision changes? Cataracts, needs surgery R eye.  Unilateral weakness, numbness or tingling? Had a "TIA  with eye blurriness, pressure in the head in 2013" no recurrent symptoms Any tremors?  Mild,  intention, L>R . No other tremors at rest  Any anosmia?  denies   Any incontinence of urine? Endorsed  Any bowel dysfunction? Denies      Patient lives with   wife History of heavy alcohol intake? denies   History of heavy tobacco use? denies   Family history of dementia? He is not sure. Mother had dementia due to cancer and alcoholism,  which may have affected her memory  Does patient drive? Yes, denies getting lost   Labs July 2024 TSH 1.27, CBC normal, CMP remarkable for T. bili 1.1, mildly elevated glucose 108, B12 346, Retired from [employer] job, Designer, industrial/product Was in [place] during the war, did not participate in battles but he has witnessed several explosions at the time   Past Medical History:  Diagnosis Date   Aortic insufficiency    Arthritis    Asthma    Carotid bruit 07/09/2021   Cervical disc disease    Depression    Diastolic dysfunction 06/20/2016   Hyperlipidemia 08/24/2015   Hypertension    Lumbar disc disease    Psoriasis      Past Surgical History:  Procedure Laterality Date   CHOLECYSTECTOMY  2012   HERNIA REPAIR     KNEE ARTHROSCOPY W/ MENISCAL REPAIR  11/2021   LEFT HEART CATH AND CORONARY ANGIOGRAPHY N/A 07/08/2018   Procedure: LEFT HEART CATH AND CORONARY ANGIOGRAPHY;  Surgeon: Kathleene Hazel, MD;  Location: [facility];  Service: Cardiovascular;  Laterality: N/A;   PAROTIDECTOMY Right 1992   PROSTATE SURGERY  2002, 2004   SINOSCOPY     SINUS EXPLORATION  2013   SPINE SURGERY  2013   L C7-T1 laminectomy and discectomy   TONSILLECTOMY       Allergies  Allergen Reactions   Atorvastatin Other (See Comments)    Calf pain. Other reaction(s): leb numbness   Zolpidem Other (See Comments)    Other reaction(s): Drowsy    Current Outpatient Medications  Medication Instructions   albuterol (VENTOLIN HFA) 108 (90 Base) MCG/ACT inhaler 2 puffs, Inhalation, Every 4 hours PRN   aspirin EC 81 mg, Oral, Daily   budesonide (PULMICORT) 0.25 mg, Nebulization, Every 4 hours PRN   budesonide-formoterol (SYMBICORT) 160-4.5 MCG/ACT inhaler 2 puffs, Inhalation, 2 times daily   cetirizine (ZYRTEC  ALLERGY) 10 mg, Oral, Daily PRN   clonazePAM (KLONOPIN) 0.5 mg, Oral, 3 times daily PRN   cyclobenzaprine (FLEXERIL) 10 mg, Oral, 2 times daily PRN   fluticasone (FLONASE) 50 MCG/ACT nasal spray 2 sprays, Each Nare, Daily, 1-2 sprays each nostril 1 (ONE) time per day.   ivabradine (CORLANOR) 5 MG TABS tablet TAKE 2 TABLETS BY MOUTH 2 HOURS PRIOR TO CT   lamoTRIgine (LAMICTAL) 200 mg, Oral, 2 times daily   lidocaine (LIDODERM) 5 % 1 patch, Transdermal, Every 24 hours, Remove & Discard patch within 12 hours or as directed by MD   loratadine (CLARITIN) 10 mg, Oral, Daily PRN   LORazepam (ATIVAN) 0.5 mg, Oral, 2 times daily   methylPREDNISolone (MEDROL DOSEPAK) 4 MG TBPK tablet See instructions   metoprolol tartrate (LOPRESSOR) 25 MG tablet TAKE 1/2 TABLET BY MOUTH TWICE A DAY   nitroGLYCERIN (NITROSTAT) 0.4 mg, Sublingual,  Every 5 min PRN   pantoprazole (PROTONIX) 40 mg, Oral, 2 times daily, Take 1 tablet by mouth 1-2 times per day.   rosuvastatin (CRESTOR) 10 MG tablet 1 tablet, Oral, Daily   Spacer/Aero-Holding Chambers DEVI 1 Device, Does not apply, As directed   Vitamin D3 5,000 Units, Oral, Daily   zinc gluconate 50 mg, Oral, Daily     VITALS:   Vitals:   11/25/22 0924  BP: 134/70  Pulse: 71  Resp: 18  SpO2: 97%  Weight: 175 lb (79.4 kg)  Height: 5' 11.5" (1.816 m)      06/11/2017   10:51 AM  Depression screen PHQ 2/9  Decreased Interest 0  Down, Depressed, Hopeless 0  PHQ - 2 Score 0    PHYSICAL EXAM   HEENT:  Normocephalic, atraumatic. The superficial temporal arteries are without ropiness or tenderness. Cardiovascular: Regular rate and rhythm. Lungs: Clear to auscultation bilaterally. Neck: There are no carotid bruits noted bilaterally.  NEUROLOGICAL:    11/25/2022    7:00 PM 03/29/2018    2:46 PM  Montreal Cognitive Assessment   Visuospatial/ Executive (0/5) 4 2  Naming (0/3) 3 3  Attention: Read list of digits (0/2) 2 2  Attention: Read list of letters (0/1)  1 1  Attention: Serial 7 subtraction starting at 100 (0/3) 3 3  Language: Repeat phrase (0/2) 0 2  Language : Fluency (0/1) 1 1  Abstraction (0/2) 2 1  Delayed Recall (0/5) 2 4  Orientation (0/6) 6 6  Total 24 25  Adjusted Score (based on education) 24 25        No data to display           Orientation:  Alert and oriented to person, place and time. Anxious appearing.  No aphasia or dysarthria. Fund of knowledge is appropriate. Recent memory impaired and remote memory intact.  Attention and concentration are reduced  Able to name objects and repeat phrases. Delayed recall 2/5 Cranial nerves: There is good facial symmetry. Extraocular muscles are intact and visual fields are full to confrontational testing. Speech is fluent and clear. No tongue deviation. Hearing is slightly decreased to conversational tone. Tone: Tone is good throughout. Sensation: Sensation is intact to light touch. Vibration is intact at the bilateral big toe.  Coordination: The patient has no difficulty with RAM's or FNF bilaterally. Normal finger to nose  Motor: Strength is 5/5 in the bilateral upper and lower extremities. There is no pronator drift. There are no fasciculations noted. DTR's: Deep tendon reflexes are 2/4 bilaterally. Gait and Station: The patient is able to ambulate without difficulty.The patient is able to heel toe walk without any difficulty. Gait is cautious and narrow. The patient is able to ambulate in a tandem fashion.       Thank you for allowing [place] the opportunity to participate in the care of this nice patient. Please do not hesitate to contact us for any questions or concerns.   Total time spent on today's visit was 61 minutes dedicated to this patient today, preparing to see patient, examining the patient, ordering tests and/or medications and counseling the patient, documenting clinical information in the EHR or other health record, independently interpreting results and communicating  results to the patient/family, discussing treatment and goals, answering patient's questions and coordinating care.  Cc:  Jackelyn Poling, DO  [facility] 11/25/2022 7:38 PM

## 2022-11-25 NOTE — Patient Instructions (Addendum)
.It was a pleasure to see you today at our office.   Recommendations:  Neurocognitive evaluation at our office   Referral to psychiatry for PTSD and ADD/ADHD, GAD  Referral to psychology  for anxiety, PTSD  Follow up in 6  months Check hearing and sleep     For psychiatric meds, mood meds: Please have your primary care physician manage these medications.   If you have any severe symptoms of a stroke, or other severe issues such as confusion,severe chills or fever, etc call 911 or go to the ER as you may need to be evaluated further    For assessment of decision of mental capacity and competency:  Call Dr. Erick Blinks, geriatric psychiatrist at [phone]      [email or web address]   RECOMMENDATIONS FOR ALL PATIENTS WITH MEMORY PROBLEMS: 1. Continue to exercise (Recommend 30 minutes of walking everyday, or 3 hours every week) 2. Increase social interactions - continue going to [place] and enjoy social gatherings with friends and family 3. Eat healthy, avoid fried foods and eat more fruits and vegetables 4. Maintain adequate blood pressure, blood sugar, and blood cholesterol level. Reducing the risk of stroke and cardiovascular disease also helps promoting better memory. 5. Avoid stressful situations. Live a simple life and avoid aggravations. Organize your time and prepare for the next day in anticipation. 6. Sleep well, avoid any interruptions of sleep and avoid any distractions in the bedroom that may interfere with adequate sleep quality 7. Avoid sugar, avoid sweets as there is a strong link between excessive sugar intake, diabetes, and cognitive impairment We discussed the Mediterranean diet, which has been shown to help patients reduce the risk of progressive memory disorders and reduces cardiovascular risk. This includes eating fish, eat fruits and green leafy vegetables, nuts like almonds and hazelnuts, walnuts, and also  use olive oil. Avoid fast foods and fried foods as much as possible. Avoid sweets and sugar as sugar use has been linked to worsening of memory function.  There is always a concern of gradual progression of memory problems. If this is the case, then we may need to adjust level of care according to patient needs. Support, both to the patient and caregiver, should then be put into place.      You have been referred for a neuropsychological evaluation (i.e., evaluation of memory and thinking abilities). Please bring someone with you to this appointment if possible, as it is helpful for the doctor to hear from both you and another adult who knows you well. Please bring eyeglasses and hearing aids if you wear them.    The evaluation will take approximately 3 hours and has two parts:   The first part is a clinical interview with the neuropsychologist (Dr. Milbert Coulter or Dr. Roseanne Reno). During the interview, the neuropsychologist will speak with you and the individual you brought to the appointment.    The second part of the evaluation is testing with the doctor's technician Annabelle Harman or Selena Batten). During the testing, the technician will ask you to remember different types of material, solve problems, and answer some questionnaires. Your family member will not be present for this portion of the evaluation.   Please note: We must reserve several hours of the neuropsychologist's time and the psychometrician's time for your evaluation appointment. As such, there is a No-Show fee of $100. If you are unable to attend any of your appointments, please contact our office as soon as possible to reschedule.      DRIVING: Regarding  driving, in patients with progressive memory problems, driving will be impaired. We advise to have someone else do the driving if trouble finding directions or if minor accidents are reported. Independent driving assessment is available to determine safety of driving.   If you are interested in the  driving assessment, you can contact the following:  [facility] in [place] [phone]  [facility] [phone]  [facility] [phone]  [facility] [phone] or [phone]   FALL PRECAUTIONS: Be cautious when walking. Scan the area for obstacles that may increase the risk of trips and falls. When getting up in the mornings, sit up at the edge of the bed for a few minutes before getting out of bed. Consider elevating the bed at the head end to avoid drop of blood pressure when getting up. Walk always in a well-lit room (use night lights in the walls). Avoid area rugs or power cords from appliances in the middle of the walkways. Use a walker or a cane if necessary and consider physical therapy for balance exercise. Get your eyesight checked regularly.  FINANCIAL OVERSIGHT: Supervision, especially oversight when making financial decisions or transactions is also recommended.  HOME SAFETY: Consider the safety of the kitchen when operating appliances like stoves, microwave oven, and blender. Consider having supervision and share cooking responsibilities until no longer able to participate in those. Accidents with firearms and other hazards in the house should be identified and addressed as well.   ABILITY TO BE LEFT ALONE: If patient is unable to contact 911 operator, consider using LifeLine, or when the need is there, arrange for someone to stay with patients. Smoking is a fire hazard, consider supervision or cessation. Risk of wandering should be assessed by caregiver and if detected at any point, supervision and safe proof recommendations should be instituted.  MEDICATION SUPERVISION: Inability to self-administer medication needs to be constantly addressed. Implement a mechanism to ensure safe administration of the medications.      Mediterranean Diet A Mediterranean diet refers to food and lifestyle choices that are based on the  traditions of countries located on the [employer]. This way of eating has been shown to help prevent certain conditions and improve outcomes for people who have chronic diseases, like kidney disease and heart disease. What are tips for following this plan? Lifestyle  Cook and eat meals together with your family, when possible. Drink enough fluid to keep your urine clear or pale yellow. Be physically active every day. This includes: Aerobic exercise like running or swimming. Leisure activities like gardening, walking, or housework. Get 7-8 hours of sleep each night. If recommended by your health care provider, drink red wine in moderation. This means 1 glass a day for nonpregnant women and 2 glasses a day for men. A glass of wine equals 5 oz (150 mL). Reading food labels  Check the serving size of packaged foods. For foods such as rice and pasta, the serving size refers to the amount of cooked product, not dry. Check the total fat in packaged foods. Avoid foods that have saturated fat or trans fats. Check the ingredients list for added sugars, such as corn syrup. Shopping  At the grocery store, buy most of your food from the areas near the walls of the store. This includes: Fresh fruits and vegetables (produce). Grains, beans, nuts, and seeds. Some of these may be available in unpackaged forms or large amounts (in bulk). Fresh seafood. Poultry and eggs. Low-fat dairy products. Buy whole  ingredients instead of prepackaged foods. Buy fresh fruits and vegetables in-season from local farmers markets. Buy frozen fruits and vegetables in resealable bags. If you do not have access to quality fresh seafood, buy precooked frozen shrimp or canned fish, such as tuna, salmon, or sardines. Buy small amounts of raw or cooked vegetables, salads, or olives from the deli or salad bar at your store. Stock your pantry so you always have certain foods on hand, such as olive oil, canned tuna, canned  tomatoes, rice, pasta, and beans. Cooking  Cook foods with extra-virgin olive oil instead of using butter or other vegetable oils. Have meat as a side dish, and have vegetables or grains as your main dish. This means having meat in small portions or adding small amounts of meat to foods like pasta or stew. Use beans or vegetables instead of meat in common dishes like chili or lasagna. Experiment with different cooking methods. Try roasting or broiling vegetables instead of steaming or sauteing them. Add frozen vegetables to soups, stews, pasta, or rice. Add nuts or seeds for added healthy fat at each meal. You can add these to yogurt, salads, or vegetable dishes. Marinate fish or vegetables using olive oil, lemon juice, garlic, and fresh herbs. Meal planning  Plan to eat 1 vegetarian meal one day each week. Try to work up to 2 vegetarian meals, if possible. Eat seafood 2 or more times a week. Have healthy snacks readily available, such as: Vegetable sticks with hummus. Greek yogurt. Fruit and nut trail mix. Eat balanced meals throughout the week. This includes: Fruit: 2-3 servings a day Vegetables: 4-5 servings a day Low-fat dairy: 2 servings a day Fish, poultry, or lean meat: 1 serving a day Beans and legumes: 2 or more servings a week Nuts and seeds: 1-2 servings a day Whole grains: 6-8 servings a day Extra-virgin olive oil: 3-4 servings a day Limit red meat and sweets to only a few servings a month What are my food choices? Mediterranean diet Recommended Grains: Whole-grain pasta. Brown rice. Bulgar wheat. Polenta. Couscous. Whole-wheat bread. Orpah Cobb. Vegetables: Artichokes. Beets. Broccoli. Cabbage. Carrots. Eggplant. Green beans. Chard. Kale. Spinach. Onions. Leeks. Peas. Squash. Tomatoes. Peppers. Radishes. Fruits: Apples. Apricots. Avocado. Berries. Bananas. Cherries. Dates. Figs. Grapes. Lemons. Melon. Oranges. Peaches. Plums. Pomegranate. Meats and other protein  foods: Beans. Almonds. Sunflower seeds. Pine nuts. Peanuts. Cod. Salmon. Scallops. Shrimp. Tuna. Tilapia. Clams. Oysters. Eggs. Dairy: Low-fat milk. Cheese. Greek yogurt. Beverages: Water. Red wine. Herbal tea. Fats and oils: Extra virgin olive oil. Avocado oil. Grape seed oil. Sweets and desserts: [place] yogurt with honey. Baked apples. Poached pears. Trail mix. Seasoning and other foods: Basil. Cilantro. Coriander. Cumin. Mint. Parsley. Sage. Rosemary. Tarragon. Garlic. Oregano. Thyme. Pepper. Balsalmic vinegar. Tahini. Hummus. Tomato sauce. Olives. Mushrooms. Limit these Grains: Prepackaged pasta or rice dishes. Prepackaged cereal with added sugar. Vegetables: Deep fried potatoes (french fries). Fruits: Fruit canned in syrup. Meats and other protein foods: Beef. Pork. Lamb. Poultry with skin. Hot dogs. Tomasa Blase. Dairy: Ice cream. Sour cream. Whole milk. Beverages: Juice. Sugar-sweetened soft drinks. Beer. Liquor and spirits. Fats and oils: Butter. Canola oil. Vegetable oil. Beef fat (tallow). Lard. Sweets and desserts: Cookies. Cakes. Pies. Candy. Seasoning and other foods: Mayonnaise. Premade sauces and marinades. The items listed may not be a complete list. Talk with your dietitian about what dietary choices are right for you. Summary The Mediterranean diet includes both food and lifestyle choices. Eat a variety of fresh fruits and vegetables, beans, nuts, seeds,  and whole grains. Limit the amount of red meat and sweets that you eat. Talk with your health care provider about whether it is safe for you to drink red wine in moderation. This means 1 glass a day for nonpregnant women and 2 glasses a day for men. A glass of wine equals 5 oz (150 mL). This information is not intended to replace advice given to you by your health care provider. Make sure you discuss any questions you have with your health care provider. Document Released: 12/06/2015 Document Revised: 01/08/2016 Document Reviewed:  12/06/2015 Elsevier Interactive Patient Education  2017 [employer].

## 2022-12-02 DIAGNOSIS — R442 Other hallucinations: Secondary | ICD-10-CM | POA: Diagnosis not present

## 2022-12-04 ENCOUNTER — Telehealth: Payer: Self-pay | Admitting: Allergy and Immunology

## 2022-12-04 DIAGNOSIS — L298 Other pruritus: Secondary | ICD-10-CM | POA: Diagnosis not present

## 2022-12-04 DIAGNOSIS — L282 Other prurigo: Secondary | ICD-10-CM | POA: Diagnosis not present

## 2022-12-04 NOTE — Telephone Encounter (Signed)
Called patient back - DOB/No AAC DPR on file- LMOVM to contact office to schedule Same Day appt for Friday, 12/05/22 due to office closing early for inclement weather.   When patient call back  - please schedule appt.

## 2022-12-04 NOTE — Telephone Encounter (Addendum)
Patient called requesting a same day appointment due to severe itching all over his body. Patient states the itching started a couple months ago but was isolated to his arm. Patient states that now the itching is all over his body.

## 2022-12-10 ENCOUNTER — Ambulatory Visit: Payer: Medicare Other | Admitting: Adult Health

## 2022-12-26 DIAGNOSIS — K7460 Unspecified cirrhosis of liver: Secondary | ICD-10-CM | POA: Diagnosis not present

## 2022-12-26 DIAGNOSIS — K219 Gastro-esophageal reflux disease without esophagitis: Secondary | ICD-10-CM | POA: Diagnosis not present

## 2022-12-26 DIAGNOSIS — K862 Cyst of pancreas: Secondary | ICD-10-CM | POA: Diagnosis not present

## 2022-12-26 DIAGNOSIS — K31A0 Gastric intestinal metaplasia, unspecified: Secondary | ICD-10-CM | POA: Diagnosis not present

## 2022-12-26 DIAGNOSIS — F458 Other somatoform disorders: Secondary | ICD-10-CM | POA: Diagnosis not present

## 2022-12-26 DIAGNOSIS — Z86010 Personal history of colonic polyps: Secondary | ICD-10-CM | POA: Diagnosis not present

## 2022-12-30 ENCOUNTER — Encounter: Payer: Self-pay | Admitting: Adult Health

## 2022-12-30 ENCOUNTER — Ambulatory Visit (INDEPENDENT_AMBULATORY_CARE_PROVIDER_SITE_OTHER): Payer: Medicare Other | Admitting: Adult Health

## 2022-12-30 DIAGNOSIS — N281 Cyst of kidney, acquired: Secondary | ICD-10-CM | POA: Diagnosis not present

## 2022-12-30 DIAGNOSIS — F411 Generalized anxiety disorder: Secondary | ICD-10-CM | POA: Diagnosis not present

## 2022-12-30 DIAGNOSIS — F4310 Post-traumatic stress disorder, unspecified: Secondary | ICD-10-CM | POA: Diagnosis not present

## 2022-12-30 DIAGNOSIS — F39 Unspecified mood [affective] disorder: Secondary | ICD-10-CM | POA: Diagnosis not present

## 2022-12-30 DIAGNOSIS — R350 Frequency of micturition: Secondary | ICD-10-CM | POA: Diagnosis not present

## 2022-12-30 DIAGNOSIS — N5201 Erectile dysfunction due to arterial insufficiency: Secondary | ICD-10-CM | POA: Diagnosis not present

## 2022-12-30 MED ORDER — ALPRAZOLAM 0.5 MG PO TABS
0.5000 mg | ORAL_TABLET | Freq: Two times a day (BID) | ORAL | 2 refills | Status: DC | PRN
Start: 2022-12-30 — End: 2023-07-07

## 2022-12-30 MED ORDER — DESVENLAFAXINE SUCCINATE ER 50 MG PO TB24
50.0000 mg | ORAL_TABLET | Freq: Every day | ORAL | 2 refills | Status: DC
Start: 2022-12-30 — End: 2023-03-31

## 2022-12-30 NOTE — Progress Notes (Signed)
Arthur Moore [id number] [birth year] 72 y.o.  Subjective:   Patient ID:  Arthur Moore is a 72 y.o. (DOB 03/01/1951) male.  Chief Complaint: No chief complaint on file.   HPI Salome Massena presents to the office today for follow-up of Episodic Mood Disorder, PTSD, depression and GAD.  Accompanied by wife.  Describes mood today as "ok". Pleasant. Denies tearfulness. Reports depression, anxiety and irritability. Denies panic attacks. Reports worry, rumination, and over thinking. Wife notes he is constantly worrying. Stating "I feel worried - I have constant anxiety". Feels stressed with home repairs. Reports stressors with neighbors. Worried about an upcoming trip to the [employer] -12. Mood is lower. Continues to take Lamitcal, but questions if it is helpful - willing to consider other options. Varying interest and motivation. Taking medications as prescribed.  Energy levels stable. Active, unable to exercise. Playing pickle ball 2 to 3 times a week Enjoys some usual interests and activities. Married. Lives with wife - 4 children between them. Spending time with family. Appetite stable. Weight loss - 170 from 186 pounds. Reports sleep issues. Getting up and down during the night to go to the bathroom. Averages 7 hours in the bed for 10 hours. Reports daytime napping up to 2 hours. Getting up and down to the bathroom up to 6 times a night. Focus and concentration stable. Completing tasks. Managing aspects of household. Retired - [place] veteran. Denies SI or HI.  Denies AH or VH. Denies self harm. Denies substance use.  Previous medication trials: Lexapro, Cymbalta, Xanax, Lamictal, Wellbutrin, Effexor, Paxil, Abilify, Pristiq, Vraylar,   PHQ2-9    Flowsheet Row Nutrition from 06/11/2017 in [facility] at [facility] Total Score 0      Flowsheet Row ED from 09/07/2022 in [facility] Emergency Department at [facility] ED  from 08/23/2022 in [facility] Emergency Department at [facility] ED from 08/06/2022 in [facility] Emergency Department at [facility]  C-SSRS RISK CATEGORY No Risk No Risk No Risk        Review of Systems:  Review of Systems  Musculoskeletal:  Negative for gait problem.  Neurological:  Negative for tremors.  Psychiatric/Behavioral:         Please refer to HPI    Medications: I have reviewed the patient's current medications.  Current Outpatient Medications  Medication Sig Dispense Refill   ALPRAZolam (XANAX) 0.5 MG tablet Take 1 tablet (0.5 mg total) by mouth 2 (two) times daily as needed for anxiety. 60 tablet 2   desvenlafaxine (PRISTIQ) 50 MG 24 hr tablet Take 1 tablet (50 mg total) by mouth daily. 30 tablet 2   albuterol (VENTOLIN HFA) 108 (90 Base) MCG/ACT inhaler Inhale 2 puffs into the lungs every 4 (four) hours as needed for wheezing or shortness of breath. 18 each 1   aspirin EC 81 MG tablet Take 81 mg by mouth daily.     budesonide (PULMICORT) 0.25 MG/2ML nebulizer solution Take 2 mLs (0.25 mg total) by nebulization every 4 (four) hours as needed (During flare ups). 150 mL 1   budesonide-formoterol (SYMBICORT) 160-4.5 MCG/ACT inhaler Inhale 2 puffs into the lungs 2 (two) times daily. 10.2 g 5   cetirizine (ZYRTEC ALLERGY) 10 MG tablet Take 1 tablet (10 mg total) by mouth daily as needed (can take an ectra dose during flare ups). 60 tablet 5   Cholecalciferol (VITAMIN D3) 5000 units TABS Take 5,000 Units by mouth daily.  cyclobenzaprine (FLEXERIL) 10 MG tablet Take 1 tablet (10 mg total) by mouth 2 (two) times daily as needed for muscle spasms. 20 tablet 0   fluticasone (FLONASE) 50 MCG/ACT nasal spray Place 2 sprays into both nostrils daily. 1-2 sprays each nostril 1 (ONE) time per day. 16 g 5   ivabradine (CORLANOR) 5 MG TABS tablet TAKE 2 TABLETS BY MOUTH 2 HOURS PRIOR TO CT 2 tablet 0   lamoTRIgine (LAMICTAL) 200 MG tablet Take 1 tablet (200 mg total)  by mouth 2 (two) times daily. 180 tablet 3   lidocaine (LIDODERM) 5 % Place 1 patch onto the skin daily. Remove & Discard patch within 12 hours or as directed by MD 30 patch 0   loratadine (CLARITIN) 10 MG tablet Take 1 tablet (10 mg total) by mouth daily as needed for allergies (Can take an extra dose during flare ups.). 60 tablet 5   methylPREDNISolone (MEDROL DOSEPAK) 4 MG TBPK tablet See instructions 21 each 0   metoprolol tartrate (LOPRESSOR) 25 MG tablet TAKE 1/2 TABLET BY MOUTH TWICE A DAY 90 tablet 3   nitroGLYCERIN (NITROSTAT) 0.4 MG SL tablet Place 1 tablet (0.4 mg total) under the tongue every 5 (five) minutes as needed for chest pain. (Patient not taking: Reported on 11/25/2022) 25 tablet 3   pantoprazole (PROTONIX) 40 MG tablet Take 1 tablet (40 mg total) by mouth 2 (two) times daily. Take 1 tablet by mouth 1-2 times per day. 180 tablet 1   rosuvastatin (CRESTOR) 10 MG tablet Take 1 tablet by mouth daily.     Spacer/Aero-Holding Chambers DEVI 1 Device by Does not apply route as directed. 1 each 1   zinc gluconate 50 MG tablet Take 1 tablet (50 mg total) by mouth daily.     No current facility-administered medications for this visit.    Medication Side Effects: None  Allergies:  Allergies  Allergen Reactions   Atorvastatin Other (See Comments)    Calf pain. Other reaction(s): leb numbness   Zolpidem Other (See Comments)    Other reaction(s): Drowsy    Past Medical History:  Diagnosis Date   Aortic insufficiency    Arthritis    Asthma    Carotid bruit 07/09/2021   Cervical disc disease    Depression    Diastolic dysfunction 06/20/2016   Hyperlipidemia 08/24/2015   Hypertension    Lumbar disc disease    Psoriasis     Past Medical History, Surgical history, Social history, and Family history were reviewed and updated as appropriate.   Please see review of systems for further details on the patient's review from today.   Objective:   Physical Exam:  There were no  vitals taken for this visit.  Physical Exam Constitutional:      General: He is not in acute distress. Musculoskeletal:        General: No deformity.  Neurological:     Mental Status: He is alert and oriented to person, place, and time.     Coordination: Coordination normal.  Psychiatric:        Attention and Perception: Attention and perception normal. He does not perceive auditory or visual hallucinations.        Mood and Affect: Mood normal. Mood is not anxious or depressed. Affect is not labile, blunt, angry or inappropriate.        Speech: Speech normal.        Behavior: Behavior normal.        Thought Content: Thought content normal.  Thought content is not paranoid or delusional. Thought content does not include homicidal or suicidal ideation. Thought content does not include homicidal or suicidal plan.        Cognition and Memory: Cognition and memory normal.        Judgment: Judgment normal.     Comments: Insight intact     Lab Review:     Component Value Date/Time   NA 143 07/02/2022 1040   K 4.2 07/02/2022 1040   CL 108 (H) 07/02/2022 1040   CO2 20 07/02/2022 1040   GLUCOSE 153 (H) 07/02/2022 1040   GLUCOSE 113 (H) 03/21/2022 1524   BUN 17 07/02/2022 1040   CREATININE 1.25 07/02/2022 1040   CALCIUM 9.1 07/02/2022 1040   PROT 6.6 10/18/2020 1130   ALBUMIN 4.7 10/18/2020 1130   AST 20 10/18/2020 1130   ALT 21 10/18/2020 1130   ALKPHOS 89 10/18/2020 1130   BILITOT 0.7 10/18/2020 1130   GFRNONAA 58 (L) 03/21/2022 1524   GFRAA >60 01/01/2020 0324       Component Value Date/Time   WBC 8.1 03/21/2022 1524   RBC 5.18 03/21/2022 1524   HGB 15.7 03/21/2022 1524   HGB 16.7 12/09/2016 1537   HCT 45.1 03/21/2022 1524   HCT 46.9 12/09/2016 1537   PLT 139 (L) 03/21/2022 1524   PLT 141 12/09/2016 1537   MCV 87.1 03/21/2022 1524   MCV 87.3 12/09/2016 1537   MCH 30.3 03/21/2022 1524   MCHC 34.8 03/21/2022 1524   RDW 12.6 03/21/2022 1524   RDW 13.6 12/09/2016 1537    LYMPHSABS 1.6 03/10/2022 0111   LYMPHSABS 1.5 12/09/2016 1537   MONOABS 0.5 03/10/2022 0111   MONOABS 0.4 12/09/2016 1537   EOSABS 0.3 03/10/2022 0111   EOSABS 0.3 12/09/2016 1537   BASOSABS 0.0 03/10/2022 0111   BASOSABS 0.0 12/09/2016 1537    No results found for: "POCLITH", "LITHIUM"   No results found for: "PHENYTOIN", "PHENOBARB", "VALPROATE", "CBMZ"   .res Assessment: Plan:    Plan:  PDMP reviewed  Lamictal 200mg  twice daily  Add Xanax 0.5mg  BID  Discussed Spravato treatment - information given.  Seeing therapist.  Plans to follow up with PCP for symptoms as well  RTC 4 weeks  Patient advised to contact office with any questions, adverse effects, or acute worsening in signs and symptoms.  Counseled patient regarding potential benefits, risks, and side effects of Lamictal to include potential risk of Stevens-Johnson syndrome. Advised patient to stop taking Lamictal and contact office immediately if rash develops and to seek urgent medical attention if rash is severe and/or spreading quickly.   Discussed potential benefits, risk, and side effects of benzodiazepines to include potential risk of tolerance and dependence, as well as possible drowsiness. Advised patient not to drive if experiencing drowsiness and to take lowest possible effective dose to minimize risk of dependence and tolerance.   Diagnoses and all orders for this visit:  Episodic mood disorder (HCC)  Posttraumatic stress disorder with delayed expression  GAD (generalized anxiety disorder) -     ALPRAZolam (XANAX) 0.5 MG tablet; Take 1 tablet (0.5 mg total) by mouth 2 (two) times daily as needed for anxiety. -     desvenlafaxine (PRISTIQ) 50 MG 24 hr tablet; Take 1 tablet (50 mg total) by mouth daily.     Please see After Visit Summary for patient specific instructions.  Future Appointments  Date Time Provider Department Center  12/31/2022  3:00 PM Chilton Si, MD [facility]  01/06/2023  10:30 AM Jessica Priest, MD [facility] None  05/28/2023 11:30 AM Marcos Eke, PA-C [facility] None  09/08/2023  8:30 AM Rosann Auerbach, PhD [facility] None  09/08/2023  9:30 AM [facility] NEUROPSYCH TECH [facility] None  10/02/2023 10:00 AM Rosann Auerbach, PhD [facility] None    No orders of the defined types were placed in this encounter.   -------------------------------

## 2022-12-31 ENCOUNTER — Ambulatory Visit (INDEPENDENT_AMBULATORY_CARE_PROVIDER_SITE_OTHER): Payer: Medicare Other | Admitting: Cardiovascular Disease

## 2022-12-31 ENCOUNTER — Encounter (HOSPITAL_BASED_OUTPATIENT_CLINIC_OR_DEPARTMENT_OTHER): Payer: Self-pay | Admitting: Cardiovascular Disease

## 2022-12-31 VITALS — BP 118/68 | HR 72 | Ht 71.5 in | Wt 174.2 lb

## 2022-12-31 DIAGNOSIS — I10 Essential (primary) hypertension: Secondary | ICD-10-CM | POA: Diagnosis not present

## 2022-12-31 DIAGNOSIS — N182 Chronic kidney disease, stage 2 (mild): Secondary | ICD-10-CM | POA: Diagnosis not present

## 2022-12-31 DIAGNOSIS — I2510 Atherosclerotic heart disease of native coronary artery without angina pectoris: Secondary | ICD-10-CM

## 2022-12-31 DIAGNOSIS — I7121 Aneurysm of the ascending aorta, without rupture: Secondary | ICD-10-CM

## 2022-12-31 DIAGNOSIS — E782 Mixed hyperlipidemia: Secondary | ICD-10-CM

## 2022-12-31 DIAGNOSIS — I351 Nonrheumatic aortic (valve) insufficiency: Secondary | ICD-10-CM

## 2022-12-31 NOTE — Patient Instructions (Signed)
Medication Instructions:  Your physician recommends that you continue on your current medications as directed. Please refer to the Current Medication list given to you today.   *If you need a refill on your cardiac medications before your next appointment, please call your pharmacy*  Lab Work: FASTING LP/CMET IN MARCH PRIOR TO FOLLOW UP   If you have labs (blood work) drawn today and your tests are completely normal, you will receive your results only by: MyChart Message (if you have MyChart) OR A paper copy in the mail If you have any lab test that is abnormal or we need to change your treatment, we will call you to review the results.  Testing/Procedures: Your physician has requested that you have an echocardiogram. Echocardiography is a painless test that uses sound waves to create images of your heart. It provides your doctor with information about the size and shape of your heart and how well your heart's chambers and valves are working. This procedure takes approximately one hour. There are no restrictions for this procedure. Please do NOT wear cologne, perfume, aftershave, or lotions (deodorant is allowed). Please arrive 15 minutes prior to your appointment time.  TO BE DONE IN MARCH   Follow-Up: At [facility], you and your health needs are our priority.  As part of our continuing mission to provide you with exceptional heart care, we have created designated Provider Care Teams.  These Care Teams include your primary Cardiologist (physician) and Advanced Practice Providers (APPs -  Physician Assistants and Nurse Practitioners) who all work together to provide you with the care you need, when you need it.  We recommend signing up for the patient portal called "MyChart".  Sign up information is provided on this After Visit Summary.  MyChart is used to connect with patients for Virtual Visits (Telemedicine).  Patients are able to view lab/test results, encounter notes, upcoming  appointments, etc.  Non-urgent messages can be sent to your provider as well.   To learn more about what you can do with MyChart, go to [email or web address].    Your next appointment:   6 month(s)  Provider:   Chilton Si, MD or Gillian Shields, NP

## 2022-12-31 NOTE — Progress Notes (Signed)
Cardiology Office Note:  .   Date:  01/15/2023  ID:  [name] Hoose, DOB 03-27-1951, MRN [id number] PCP: Jackelyn Poling, DO  [facility] Providers Cardiologist:  Chilton Si, MD     History of Present Illness: .   Mearle Schweder is a 72 y.o. male  with mild CAD, mild-moderate aortic insufficiency, hypertension, asthma, TIA, OSA not on CPAP, liver fibrosis, anxiety, and depression who presents for follow up.  Mr. Eichner wore a Holter monitor 06/2013 that revealed rare PACs and PVCs.  He previously followed up with a cardiologist in [place] due to mild AR.  He subsequently had an echo at the [place] in 2015 that reported it as mild-moderate.  He had a repeat echocardiogram 12/05/15 revealed LVEF 55-60% with grade 2 diastolic. Aortic regurgitation remained mild to moderate.  Mr. Clink reported fatigue and atypical chest pain.  He was referred for an exercise Myoview 3/18 that revealed LVEF 49% and asynchronous contraction. There was a small defect in the basal inferior region that was thought to be diaphragmatic attenuation.  He achieved 7 METS on a Bruce protocol.  He called our office after these results due to persistent chest pain and it was suggested that he follow up with GI.  He was subsequently diagnosed with diverticulitis.   Mr. Dambrosi saw Theodore Demark, PA-C, for chest pain.  He had previously been seen in the ED with chest pain.  Cardiac enzymes and CT of the chest, abdomen, and pelvis were unremarkable.  His chest pain was atypical and occurred when he was trying to lie down in bed at night.  He was referred for coronary CT-A 04/2017 that revealed a coronary calcium score 361 which is 78th percentile.  He had extensive calcified plaque in the proximal LAD with possible moderate stenosis.  FFR was negative.  Repeat echo 08/2017 revealed LVEF 55 to 65% with grade 1 diastolic dysfunction.  He was seen in the ED 06/2018 with chest pain.  Cardiac enzymes were negative.  He  underwent cardiac catheterization 06/2018 which revealed 50% stenosis in D1 and otherwise 10 to 20% stenoses in the distal RCA, proximal left circumflex, distal left main, and proximal LAD.  Mr. Garone was started on Vascepa for hypertriglyceridemia and CAD.  It worked well for his triglycerides.  However he was unable to afford it.  He tried to get it filled through the [place] and was denied.  Metoprolol succinate was also denied as were PCSK9 inhibitors.  Mr. Smeby was seen by Edd Fabian on 04/2019 after a visit to the ED for chest pain.  His ischemia evaluation was unremarkable.  His chest pain was worse with inhalation.  He had no exertional chest pain.  No further work-up was performed.    He had blood work at the [place] and the numbers were excellent.  His lipids were all at goal. He reported some LE heaviness that he attributed to deconditioning. He was seen in the ED 03/2021 after a head injury while playing pickleball. In 10/2021 he followed up with Gillian Shields, NP for preoperative clearance. He underwent right medial meniscus repair performed by Dr. Aundria Rud in [facility] 11/2021.   He called the office 05/2022 complaining of shortness of breath for 1 month.  He had a coronary CTA 06/2022 that revealed moderate stenosis of the LAD and mild disease in the RCA and left circumflex.  FFR testing was consistent with nonobstructive disease.  He followed up with Gillian Shields, NP 06/2022 and noted that his  breathing was better.  Symptoms improved with use of inhalers and nebulizer. He was referred to the prep program at the [facility].  Mr. Doles presents for a routine follow-up. He reports a significant improvement in his knee pain, allowing him to resume playing pickleball for up to three hours at a time without any associated chest pain or dyspnea. He notes that his knee remains slightly stiff but is not painful.  The patient also reports a significant amount of stress and anxiety, describing a persistent  sensation of "butterflies" in his stomach for the past two years. He has sought help from a psychiatric nurse practitioner and has been prescribed Xanax for anxiety. He also mentions a history of PTSD and depression.  He is scheduled for a sleep study to evaluate for sleep apnea.  Mr. Stock continues to be proactive in managing his health, including maintaining a regular exercise routine and a diet low in carbohydrates and sugars. He has lost weight, with his weight fluctuating between 168 and 170 pounds. His A1C has decreased from 6.2 to 5.9, keeping him in the pre-diabetic range.  He has been adhering to his prescribed medications, including lamotrigine for bipolar disorder, metoprolol for heart disease prevention, and nitroglycerin as needed for chest pain, although he reports never having to use it. He expresses a strong aversion to taking antipsychotic medications.  The patient has been under the care of a liver specialist for his non-alcoholic fatty liver disease. He reports a recent cholesterol level of 137, with an LDL of 70, and a triglyceride level that has decreased from 235 to around 135-140. He expresses concern about his HDL level, which has decreased from 32-33 to 25.       ROS:  As per HPI  Studies Reviewed: Marland Kitchen   EKG Interpretation Date/Time:  Wednesday December 31 2022 14:39:01 EDT Ventricular Rate:  72 PR Interval:  172 QRS Duration:  94 QT Interval:  388 QTC Calculation: 424 R Axis:   -7  Text Interpretation: Normal sinus rhythm Normal ECG No significant change since last tracing Confirmed by Chilton Si ([id number]) on 01/15/2023 5:23:22 PM   EKG 12/31/22: Sinus rhythm.  Rate 72 bpm.  Risk Assessment/Calculations:         Physical Exam:    VS:  BP 118/68 (BP Location: Left Arm, Patient Position: Sitting, Cuff Size: Normal)   Pulse 72   Ht 5' 11.5" (1.816 m)   Wt 174 lb 3.2 oz (79 kg)   BMI 23.96 kg/m  , BMI Body mass index is 23.96 kg/m. GENERAL:  Well  appearing HEENT: Pupils equal round and reactive, fundi not visualized, oral mucosa unremarkable NECK:  No jugular venous distention, waveform within normal limits, carotid upstroke brisk and symmetric, no bruits, no thyromegaly LUNGS:  Clear to auscultation bilaterally HEART:  RRR.  PMI not displaced or sustained,S1 and S2 within normal limits, no S3, no S4, no clicks, no rubs, no murmurs ABD:  Flat, positive bowel sounds normal in frequency in pitch, no bruits, no rebound, no guarding, no midline pulsatile mass, no hepatomegaly, no splenomegaly EXT:  2 plus pulses throughout, no edema, no cyanosis no clubbing SKIN:  No rashes no nodules NEURO:  Cranial nerves II through XII grossly intact, motor grossly intact throughout PSYCH:  Cognitively intact, oriented to person place and time   ASSESSMENT AND PLAN: .    # Coronary Artery Disease Stable with nonobstructive disease. Regular exercise with good stamina and no angina. Cholesterol levels have been controlled, but  recent LDL increase possibly due to decreased activity during knee and breathing issues. -Continue current medications and lifestyle modifications. -Obtain recent cholesterol levels from external provider. -Repeat lipid panel and CMP in March 2025.  # Anxiety Reports ongoing stress and anxiety, managed by [facility]. Recent addition of Xanax for acute episodes. -Continue current psychiatric management.  # Sleep Apnea History of inconsistent sleep study results. Currently undergoing another sleep study. -Continue current management and follow up with sleep medicine specialist.  # Aortic Regurgitation:  Stable on previous imaging. -Schedule repeat echocardiogram in March 2025   # Pre-diabetes A1c levels have decreased from 6.2 to 5.9 with lifestyle modifications including exercise and diet. -Continue current lifestyle modifications.  # Nocturia Reports frequent nocturnal urination, disrupting sleep. Currently  undergoing pelvic floor physical therapy. -Continue current management and physical therapy.  # General Health Maintenance -Continue regular exercise, aiming for at least 150 minutes per week. -Continue dietary modifications for cholesterol and blood sugar control.         Dispo: F/u in 6 months.   Signed, Chilton Si, MD

## 2023-01-01 DIAGNOSIS — L84 Corns and callosities: Secondary | ICD-10-CM | POA: Diagnosis not present

## 2023-01-01 DIAGNOSIS — B078 Other viral warts: Secondary | ICD-10-CM | POA: Diagnosis not present

## 2023-01-01 DIAGNOSIS — L298 Other pruritus: Secondary | ICD-10-CM | POA: Diagnosis not present

## 2023-01-06 ENCOUNTER — Encounter: Payer: Self-pay | Admitting: Allergy and Immunology

## 2023-01-06 ENCOUNTER — Other Ambulatory Visit: Payer: Self-pay

## 2023-01-06 ENCOUNTER — Ambulatory Visit (INDEPENDENT_AMBULATORY_CARE_PROVIDER_SITE_OTHER): Payer: Medicare Other | Admitting: Allergy and Immunology

## 2023-01-06 VITALS — BP 110/60 | HR 63 | Temp 97.9°F | Resp 16 | Ht 72.0 in | Wt 173.5 lb

## 2023-01-06 DIAGNOSIS — J4540 Moderate persistent asthma, uncomplicated: Secondary | ICD-10-CM

## 2023-01-06 DIAGNOSIS — K219 Gastro-esophageal reflux disease without esophagitis: Secondary | ICD-10-CM

## 2023-01-06 DIAGNOSIS — J3089 Other allergic rhinitis: Secondary | ICD-10-CM

## 2023-01-06 DIAGNOSIS — L299 Pruritus, unspecified: Secondary | ICD-10-CM

## 2023-01-06 MED ORDER — BUDESONIDE-FORMOTEROL FUMARATE 160-4.5 MCG/ACT IN AERO: 2.0000 | INHALATION_SPRAY | Freq: Two times a day (BID) | RESPIRATORY_TRACT | 1 refills | Status: DC

## 2023-01-06 MED ORDER — FLUTICASONE PROPIONATE 50 MCG/ACT NA SUSP: 2.0000 | Freq: Every day | NASAL | 1 refills | Status: DC

## 2023-01-06 MED ORDER — PANTOPRAZOLE SODIUM 40 MG PO TBEC: 40.0000 mg | DELAYED_RELEASE_TABLET | Freq: Two times a day (BID) | ORAL | 1 refills | Status: DC

## 2023-01-06 MED ORDER — CETIRIZINE HCL 10 MG PO TABS: 10.0000 mg | ORAL_TABLET | Freq: Every day | ORAL | 1 refills | Status: DC | PRN

## 2023-01-06 MED ORDER — SPACER/AERO-HOLDING CHAMBERS DEVI: 1.0000 | 1 refills | Status: DC

## 2023-01-06 NOTE — Progress Notes (Unsigned)
[name] - [place] - [place] - [place] - [place]   Follow-up Note  Referring Provider: Darrin Nipper Family M* Primary Provider: Jackelyn Poling, DO Date of Office Visit: 01/06/2023  Subjective:   Arthur Moore (DOB: 10-Aug-1950) is a 72 y.o. male who [facility] in re-evaluation of the following:  HPI: Arthur Moore returns to this clinic in evaluation of asthma, allergic rhinitis, LPR.  I last saw him in this clinic 08 July 2022.  He has really done well with his asthma and allergic rhinitis since his last visit and has been able to go through each season of the year with no difficulty at all and he has not required a systemic steroid or an antibiotic for any type of airway issue and he rarely uses any short acting bronchodilator while he continues on Symbicort and Flonase.  He has discontinued his immunotherapy after several years of use.  His reflux is under very good control while using his Protonix.  He has had a pruritic issue that has developed in the past and was under pretty good control using Zyrtec but because of an issue with fatigue we asked him to stop his Zyrtec and use some Claritin but unfortunately his pruritus flared and he has seen a dermatologist about this issue and he is back on Zyrtec twice a day which is working quite well regarding his pruritus.  He does not receive the flu vaccine.  Allergies as of 01/06/2023       Reactions   Atorvastatin Other (See Comments)   Calf pain. Other reaction(s): leb numbness   Zolpidem Other (See Comments)   Other reaction(s): Drowsy        Medication List    albuterol 108 (90 Base) MCG/ACT inhaler Commonly known as: VENTOLIN HFA Inhale 2 puffs into the lungs every 4 (four) hours as needed for wheezing or shortness of breath.   ALPRAZolam 0.5 MG tablet Commonly known as: Xanax Take 1 tablet (0.5 mg total) by mouth 2 (two) times daily as needed for anxiety.   aspirin  EC 81 MG tablet Take 81 mg by mouth daily.   budesonide 0.25 MG/2ML nebulizer solution Commonly known as: PULMICORT Take 2 mLs (0.25 mg total) by nebulization every 4 (four) hours as needed (During flare ups).   budesonide-formoterol 160-4.5 MCG/ACT inhaler Commonly known as: SYMBICORT Inhale 2 puffs into the lungs 2 (two) times daily.   cetirizine 10 MG tablet Commonly known as: ZyrTEC Allergy Take 1 tablet (10 mg total) by mouth daily as needed (can take an ectra dose during flare ups).   desvenlafaxine 50 MG 24 hr tablet Commonly known as: Pristiq Take 1 tablet (50 mg total) by mouth daily.   fluticasone 50 MCG/ACT nasal spray Commonly known as: FLONASE Place 2 sprays into both nostrils daily. 1-2 sprays each nostril 1 (ONE) time per day.   lamoTRIgine 200 MG tablet Commonly known as: LAMICTAL Take 1 tablet (200 mg total) by mouth 2 (two) times daily.   loratadine 10 MG tablet Commonly known as: Claritin Take 1 tablet (10 mg total) by mouth daily as needed for allergies (Can take an extra dose during flare ups.).   metoprolol tartrate 25 MG tablet Commonly known as: LOPRESSOR TAKE 1/2 TABLET BY MOUTH TWICE A DAY   nitroGLYCERIN 0.4 MG SL tablet Commonly known as: NITROSTAT Place 1 tablet (0.4 mg total) under the tongue every 5 (five) minutes as needed for chest pain.   pantoprazole 40  MG tablet Commonly known as: PROTONIX Take 1 tablet (40 mg total) by mouth 2 (two) times daily. Take 1 tablet by mouth 1-2 times per day.   rosuvastatin 10 MG tablet Commonly known as: CRESTOR Take 1 tablet by mouth daily.   Spacer/Aero-Holding [employer] 1 Device by Does not apply route as directed.   Vitamin D3 125 MCG (5000 UT) Tabs Take 5,000 Units by mouth daily.   zinc gluconate 50 MG tablet Take 1 tablet (50 mg total) by mouth daily.    Past Medical History:  Diagnosis Date   Aortic insufficiency    Arthritis    Asthma    Carotid bruit 07/09/2021   Cervical disc  disease    Depression    Diastolic dysfunction 06/20/2016   Hyperlipidemia 08/24/2015   Hypertension    Lumbar disc disease    Psoriasis     Past Surgical History:  Procedure Laterality Date   CHOLECYSTECTOMY  2012   HERNIA REPAIR     KNEE ARTHROSCOPY W/ MENISCAL REPAIR  11/2021   LEFT HEART CATH AND CORONARY ANGIOGRAPHY N/A 07/08/2018   Procedure: LEFT HEART CATH AND CORONARY ANGIOGRAPHY;  Surgeon: Kathleene Hazel, MD;  Location: [facility];  Service: Cardiovascular;  Laterality: N/A;   PAROTIDECTOMY Right 1992   PROSTATE SURGERY  2002, 2004   SINOSCOPY     SINUS EXPLORATION  2013   SPINE SURGERY  2013   L C7-T1 laminectomy and discectomy   TONSILLECTOMY      Review of systems negative except as noted in HPI / PMHx or noted below:  Review of Systems  Constitutional: Negative.   HENT: Negative.    Eyes: Negative.   Respiratory: Negative.    Cardiovascular: Negative.   Gastrointestinal: Negative.   Genitourinary: Negative.   Musculoskeletal: Negative.   Skin: Negative.   Neurological: Negative.   Endo/Heme/Allergies: Negative.   Psychiatric/Behavioral: Negative.       Objective:   Vitals:   01/06/23 1038  BP: 110/60  Pulse: 63  Resp: 16  Temp: 97.9 F (36.6 C)  SpO2: 94%   Height: 6' (182.9 cm)  Weight: 173 lb 8 oz (78.7 kg)   Physical Exam Constitutional:      Appearance: He is not diaphoretic.  HENT:     Head: Normocephalic.     Right Ear: Tympanic membrane, ear canal and external ear normal.     Left Ear: Tympanic membrane, ear canal and external ear normal.     Nose: Nose normal. No mucosal edema or rhinorrhea.     Mouth/Throat:     Pharynx: Uvula midline. No oropharyngeal exudate.  Eyes:     Conjunctiva/sclera: Conjunctivae normal.  Neck:     Thyroid: No thyromegaly.     Trachea: Trachea normal. No tracheal tenderness or tracheal deviation.  Cardiovascular:     Rate and Rhythm: Normal rate and regular rhythm.     Heart  sounds: Normal heart sounds, S1 normal and S2 normal. No murmur heard. Pulmonary:     Effort: No respiratory distress.     Breath sounds: Normal breath sounds. No stridor. No wheezing or rales.  Lymphadenopathy:     Head:     Right side of head: No tonsillar adenopathy.     Left side of head: No tonsillar adenopathy.     Cervical: No cervical adenopathy.  Skin:    Findings: No erythema or rash.     Nails: There is no clubbing.  Neurological:  Mental Status: He is alert.     Diagnostics: Spirometry was performed and demonstrated an FEV1 of 2.94 at 90 % of predicted.  Assessment and Plan:   1. Asthma, moderate persistent, well-controlled   2. Other allergic rhinitis   3. LPRD (laryngopharyngeal reflux disease)   4. Pruritic disorder    1. Continue Symbicort 160 - 2 puffs twice a day with spacer (empty lungs)  2. Continue Flonase 1-2 sprays each nostril one time per day   3. Continue Protonix 40 mg 1-2 times per day  4. Continue the following if needed:   A. Cetirizine 10 mg - 1 tablet 1-2 time per day   B. Symbicort 160 - 2 inhalations every 6 hours  5. Return to clinic in 6 months or earlier if problem  6. Influenza = Tamiflu, Covid = Paxlovid  [name] appears to be doing quite well on his current plan of using anti-inflammatory agents for both his upper and lower airway and therapy directed against reflux.  Will now allow him to use his Symbicort as an anti-inflammatory rescue medicine should it be required.  I will see him back in this clinic in 6 months or earlier if there is a problem.  Laurette Schimke, MD Allergy / Immunology [facility]

## 2023-01-06 NOTE — Patient Instructions (Addendum)
  1. Continue Symbicort 160 - 2 puffs twice a day with spacer (empty lungs)  2. Continue Flonase 1-2 sprays each nostril one time per day   3. Continue Protonix 40 mg 1-2 times per day  4. Continue the following if needed:   A. Cetirizine 10 mg - 1 tablet 1-2 time per day   B. Symbicort 160 - 2 inhalations every 6 hours  5. Return to clinic in 6 months or earlier if problem  6. Influenza = Tamiflu, Covid = Paxlovid

## 2023-01-07 ENCOUNTER — Encounter: Payer: Self-pay | Admitting: Allergy and Immunology

## 2023-01-12 ENCOUNTER — Ambulatory Visit: Payer: Medicare Other | Admitting: Psychiatry

## 2023-01-12 DIAGNOSIS — Z8659 Personal history of other mental and behavioral disorders: Secondary | ICD-10-CM

## 2023-01-12 DIAGNOSIS — F331 Major depressive disorder, recurrent, moderate: Secondary | ICD-10-CM | POA: Diagnosis not present

## 2023-01-12 DIAGNOSIS — F411 Generalized anxiety disorder: Secondary | ICD-10-CM

## 2023-01-12 NOTE — Progress Notes (Signed)
Psychotherapy Progress Note [facility] Marliss Czar, PhD LP  Patient ID: Arthur Moore [facility])    MRN: [id number] Therapy format: Individual psychotherapy Date: 01/12/2023      Start: 1:35p     Stop: 2:18p     Time Spent: 43 min Location: In-person   Session narrative (presenting needs, interim history, self-report of stressors and symptoms, applications of prior therapy, status changes, and interventions made in session) Pt seen once early this year, deferred return appt d/t anxiety about therapy (negative prior experience and [place] veteran sensitivities) until recently re-referred by psychiatry (and presumably W's encouragement).  Phone message left just before 1pm, conveyed by staff at 20 after, that he is running late.  Arrived 1:34p, having erroneously started to go to a different appt that's on tomorrow.  Open and talkative on arrival, seen alone.  Reports tremendous stress last couple years, this week stunned by 48yo son [name] calling about his sense of failure and SI, even though he is set to marry in April, and [name] is to do the wedding.  Seems his fiancee Zella Ball had asked him to go to his EAP, partly to get his emotions under control.  Believes son [name] carries baggage from his [place] mother not bonding with him, for one thing, and been indulged for money, without love.  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  [name] had to orchestrate getting the guns out for safety and took Robin's concerns that she not go through with [name] what she went through with a grueling prior relationship.  Now wedding on hold, rationally, and he has brokered Jeremy's willingness to begin help, tentatively hopeful he will work through his anger and despair to be ready to marry and claim a good relationship.  Still unreconciled with daughter, whom he went to see.    Has had to deal with "butterflies" in stomach, but more resolved  now for going on Xanax, and reading a resource on instinctive fears from a [place] perspective.  Meds changed recently, with Xanax helping settle him.  Has had genetic testing, which validated problematic processing of most antidepressants.  Interested in working on gut health now, having been turned onto new findings about how much it has to do with emotional resilience.  Started Atrantil, a gut health supplement, with early positive results.  Involved in devotionals, videos on discipleship and mentoring, and monthly ministry/mentoring meetings with a woman in [place].  Poised to join a church now, and is in a Christian men's group that unexpectedly has in common the issue of rebelling adult children.  Affirmed and encouraged.  Therapeutic modalities: Cognitive Behavioral Therapy, Solution-Oriented/Positive Psychology, Environmental manager, and Faith-sensitive  Mental Status/Observations:  Appearance:   Casual     Behavior:  Appropriate  Motor:  Normal  Speech/Language:   Clear and Coherent  Affect:  Appropriate  Mood:  anxious and responsive, clearer better energy than Feb  Thought process:  normal  Thought content:    WNL  Sensory/Perceptual disturbances:    WNL  Orientation:  Fully oriented  Attention:  Good    Concentration:  Good  Memory:  WNL  Insight:    Good  Judgment:   Good  Impulse Control:  Good   Risk Assessment: Danger to Self: No Self-injurious Behavior: No Danger to Others: No Physical Aggression / Violence: No Duty to Warn: No Access to Firearms a concern: No  Assessment of progress:  progressing  Diagnosis:  ICD-10-CM   1. GAD (generalized anxiety disorder)  F41.1     2. History of posttraumatic stress disorder (PTSD)  Z86.59     3. Major depressive disorder, recurrent episode, moderate (HCC)  F33.1      Plan:  Self-care -- Continue helpful devotionals, gut health Family care issues -- Endorse handling of son's distress, continue as needed working  collaboratively with him and prospective DIL Spiritual -- Continue helpful practices of prayer, devotions, and carry through in further joining church and helpful small group.  As needed, prior advice to make prayer as much dialogue as monologue. Consider further goals for therapy.  Can address intrusive thoughts, memories, anxiety coping skills, and family communication going forward, and raise meditation skills including mindfulness from a Christian orientation. Other recommendations/advice -- As may be noted above.  Continue to utilize previously learned skills ad lib. Medication compliance -- Maintain medication as prescribed and work faithfully with relevant prescriber(s) if any changes are desired or seem indicated. Crisis service -- Aware of call list and work-in appts.  Call the clinic on-call service, 988/hotline, 911, or present to [facility] or ER if any life-threatening psychiatric crisis. Followup -- Return 2-4 wks as able, for time as available.  Next scheduled visit with me Visit date not found.  Next scheduled in this office 01/27/2023.  Robley Fries, PhD Marliss Czar, PhD LP Clinical Psychologist, [facility] [address], [address] [place], [place] [address] [phone]

## 2023-01-15 ENCOUNTER — Encounter (HOSPITAL_BASED_OUTPATIENT_CLINIC_OR_DEPARTMENT_OTHER): Payer: Self-pay | Admitting: Cardiovascular Disease

## 2023-01-20 ENCOUNTER — Ambulatory Visit: Payer: Medicare Other | Admitting: Physician Assistant

## 2023-01-27 ENCOUNTER — Encounter: Payer: Self-pay | Admitting: Adult Health

## 2023-01-27 ENCOUNTER — Ambulatory Visit (INDEPENDENT_AMBULATORY_CARE_PROVIDER_SITE_OTHER): Payer: Medicare Other | Admitting: Adult Health

## 2023-01-27 DIAGNOSIS — F331 Major depressive disorder, recurrent, moderate: Secondary | ICD-10-CM | POA: Diagnosis not present

## 2023-01-27 DIAGNOSIS — F4310 Post-traumatic stress disorder, unspecified: Secondary | ICD-10-CM | POA: Diagnosis not present

## 2023-01-27 DIAGNOSIS — F411 Generalized anxiety disorder: Secondary | ICD-10-CM

## 2023-01-27 DIAGNOSIS — F39 Unspecified mood [affective] disorder: Secondary | ICD-10-CM | POA: Diagnosis not present

## 2023-01-27 NOTE — Progress Notes (Signed)
Arthur Moore [id number] 1951-04-19 72 y.o.  Subjective:   Patient ID:  Arthur Moore is a 71 y.o. (DOB 01-10-1951) male.  Chief Complaint: No chief complaint on file.   HPI Arthur Moore presents to the office today for follow-up of Episodic Mood Disorder, PTSD, depression and GAD.  Accompanied by wife.  Describes mood today as "ok". Pleasant. Denies tearfulness. Denies depression, anxiety and irritability. Denies panic attacks. Denies worry, rumination, and over thinking. Mood is stab;le. Stating "I feel like I'm doing better". Reports addition of Xanax helpful to manage mood symptoms. Varying interest and motivation. Taking medications as prescribed.  Energy levels stable. Active, has a regular exercise routine. Playing pickle ball 2 to 3 times a week Enjoys some usual interests and activities. Married. Lives with wife - 4 children between them. Spending time with family. Appetite stable. Weight gain - 170 to 175 pounds. Sleeps well most nights. Averages 10 hours. Reports less daytime napping. Focus and concentration stable. Completing tasks. Managing aspects of household. Retired - [place] veteran. Denies SI or HI.  Denies AH or VH. Denies self harm. Denies substance use.   PHQ2-9    Flowsheet Row Nutrition from 06/11/2017 in [facility] at [facility] Total Score 0      Flowsheet Row ED from 09/07/2022 in [facility] Department at [facility] ED from 08/23/2022 in [facility] Emergency Department at [facility] ED from 08/06/2022 in [facility] Department at [facility]  C-SSRS RISK CATEGORY No Risk No Risk No Risk        Review of Systems:  Review of Systems  Musculoskeletal:  Negative for gait problem.  Neurological:  Negative for tremors.  Psychiatric/Behavioral:         Please refer to HPI    Medications: I have reviewed the patient's current  medications.  Current Outpatient Medications  Medication Sig Dispense Refill   albuterol (VENTOLIN HFA) 108 (90 Base) MCG/ACT inhaler Inhale 2 puffs into the lungs every 4 (four) hours as needed for wheezing or shortness of breath. 18 each 1   ALPRAZolam (XANAX) 0.5 MG tablet Take 1 tablet (0.5 mg total) by mouth 2 (two) times daily as needed for anxiety. 60 tablet 2   aspirin EC 81 MG tablet Take 81 mg by mouth daily.     budesonide (PULMICORT) 0.25 MG/2ML nebulizer solution Take 2 mLs (0.25 mg total) by nebulization every 4 (four) hours as needed (During flare ups). 150 mL 1   budesonide-formoterol (SYMBICORT) 160-4.5 MCG/ACT inhaler Inhale 2 puffs into the lungs 2 (two) times daily. With spacer 30.6 g 1   cetirizine (ZYRTEC ALLERGY) 10 MG tablet Take 1 tablet (10 mg total) by mouth daily as needed (can take an ectra dose during flare ups). 180 tablet 1   Cholecalciferol (VITAMIN D3) 5000 units TABS Take 5,000 Units by mouth daily.      desvenlafaxine (PRISTIQ) 50 MG 24 hr tablet Take 1 tablet (50 mg total) by mouth daily. 30 tablet 2   fluticasone (FLONASE) 50 MCG/ACT nasal spray Place 2 sprays into both nostrils daily. 1-2 sprays each nostril 1 (ONE) time per day. 48 g 1   lamoTRIgine (LAMICTAL) 200 MG tablet Take 1 tablet (200 mg total) by mouth 2 (two) times daily. 180 tablet 3   loratadine (CLARITIN) 10 MG tablet Take 1 tablet (10 mg total) by mouth daily as needed for allergies (Can take an extra dose during flare ups.). 60  tablet 5   metoprolol tartrate (LOPRESSOR) 25 MG tablet TAKE 1/2 TABLET BY MOUTH TWICE A DAY 90 tablet 3   nitroGLYCERIN (NITROSTAT) 0.4 MG SL tablet Place 1 tablet (0.4 mg total) under the tongue every 5 (five) minutes as needed for chest pain. 25 tablet 3   pantoprazole (PROTONIX) 40 MG tablet Take 1 tablet (40 mg total) by mouth 2 (two) times daily. 180 tablet 1   rosuvastatin (CRESTOR) 10 MG tablet Take 1 tablet by mouth daily.     Spacer/Aero-Holding [employer]  1 Device by Does not apply route as directed. 1 each 1   zinc gluconate 50 MG tablet Take 1 tablet (50 mg total) by mouth daily.     No current facility-administered medications for this visit.    Medication Side Effects: None  Allergies:  Allergies  Allergen Reactions   Atorvastatin Other (See Comments)    Calf pain. Other reaction(s): leb numbness   Zolpidem Other (See Comments)    Other reaction(s): Drowsy    Past Medical History:  Diagnosis Date   Aortic insufficiency    Arthritis    Asthma    Carotid bruit 07/09/2021   Cervical disc disease    Depression    Diastolic dysfunction 06/20/2016   Hyperlipidemia 08/24/2015   Hypertension    Lumbar disc disease    Psoriasis     Past Medical History, Surgical history, Social history, and Family history were reviewed and updated as appropriate.   Please see review of systems for further details on the patient's review from today.   Objective:   Physical Exam:  There were no vitals taken for this visit.  Physical Exam Constitutional:      General: He is not in acute distress. Musculoskeletal:        General: No deformity.  Neurological:     Mental Status: He is alert and oriented to person, place, and time.     Coordination: Coordination normal.  Psychiatric:        Attention and Perception: Attention and perception normal. He does not perceive auditory or visual hallucinations.        Mood and Affect: Mood normal. Mood is not anxious or depressed. Affect is not labile, blunt, angry or inappropriate.        Speech: Speech normal.        Behavior: Behavior normal.        Thought Content: Thought content normal. Thought content is not paranoid or delusional. Thought content does not include homicidal or suicidal ideation. Thought content does not include homicidal or suicidal plan.        Cognition and Memory: Cognition and memory normal.        Judgment: Judgment normal.     Comments: Insight intact     Lab  Review:     Component Value Date/Time   NA 143 07/02/2022 1040   K 4.2 07/02/2022 1040   CL 108 (H) 07/02/2022 1040   CO2 20 07/02/2022 1040   GLUCOSE 153 (H) 07/02/2022 1040   GLUCOSE 113 (H) 03/21/2022 1524   BUN 17 07/02/2022 1040   CREATININE 1.25 07/02/2022 1040   CALCIUM 9.1 07/02/2022 1040   PROT 6.6 10/18/2020 1130   ALBUMIN 4.7 10/18/2020 1130   AST 20 10/18/2020 1130   ALT 21 10/18/2020 1130   ALKPHOS 89 10/18/2020 1130   BILITOT 0.7 10/18/2020 1130   GFRNONAA 58 (L) 03/21/2022 1524   GFRAA >60 01/01/2020 0324  Component Value Date/Time   WBC 8.1 03/21/2022 1524   RBC 5.18 03/21/2022 1524   HGB 15.7 03/21/2022 1524   HGB 16.7 12/09/2016 1537   HCT 45.1 03/21/2022 1524   HCT 46.9 12/09/2016 1537   PLT 139 (L) 03/21/2022 1524   PLT 141 12/09/2016 1537   MCV 87.1 03/21/2022 1524   MCV 87.3 12/09/2016 1537   MCH 30.3 03/21/2022 1524   MCHC 34.8 03/21/2022 1524   RDW 12.6 03/21/2022 1524   RDW 13.6 12/09/2016 1537   LYMPHSABS 1.6 03/10/2022 0111   LYMPHSABS 1.5 12/09/2016 1537   MONOABS 0.5 03/10/2022 0111   MONOABS 0.4 12/09/2016 1537   EOSABS 0.3 03/10/2022 0111   EOSABS 0.3 12/09/2016 1537   BASOSABS 0.0 03/10/2022 0111   BASOSABS 0.0 12/09/2016 1537    No results found for: "POCLITH", "LITHIUM"   No results found for: "PHENYTOIN", "PHENOBARB", "VALPROATE", "CBMZ"   .res Assessment: Plan:    Plan:  PDMP reviewed  Lamictal 200mg  twice daily  Xanax 0.5mg  BID - typically taking once a day.  Discussed Spravato treatment - information given.  Seeing therapist.  Plans to follow up with PCP for symptoms as well  RTC 4 weeks  Patient advised to contact office with any questions, adverse effects, or acute worsening in signs and symptoms.  Counseled patient regarding potential benefits, risks, and side effects of Lamictal to include potential risk of Stevens-Johnson syndrome. Advised patient to stop taking Lamictal and contact office  immediately if rash develops and to seek urgent medical attention if rash is severe and/or spreading quickly.   Discussed potential benefits, risk, and side effects of benzodiazepines to include potential risk of tolerance and dependence, as well as possible drowsiness. Advised patient not to drive if experiencing drowsiness and to take lowest possible effective dose to minimize risk of dependence and tolerance.   There are no diagnoses linked to this encounter.   Please see After Visit Summary for patient specific instructions.  Future Appointments  Date Time Provider Department Center  01/27/2023 10:40 AM Isaih Bulger, Thereasa [place], NP [facility] None  02/24/2023  9:00 AM Robley Fries, PhD [facility] None  03/11/2023  1:00 PM Robley Fries, PhD [facility] None  05/28/2023 11:30 AM Marcos Eke, PA-C [facility] None  07/07/2023 10:30 AM Lucie Leather, Alvira Philips, MD [facility] None  07/13/2023 11:00 AM [facility]-ECHO/VAS [facility]  09/08/2023  8:30 AM Rosann Auerbach, PhD [facility] None  09/08/2023  9:30 AM [facility]- NEUROPSYCH TECH [facility] None  10/02/2023 10:00 AM Rosann Auerbach, PhD [facility] None    No orders of the defined types were placed in this encounter.   -------------------------------

## 2023-02-03 DIAGNOSIS — R1030 Lower abdominal pain, unspecified: Secondary | ICD-10-CM | POA: Diagnosis not present

## 2023-02-03 DIAGNOSIS — R197 Diarrhea, unspecified: Secondary | ICD-10-CM | POA: Diagnosis not present

## 2023-02-12 ENCOUNTER — Ambulatory Visit (INDEPENDENT_AMBULATORY_CARE_PROVIDER_SITE_OTHER): Payer: Medicare Other | Admitting: Psychiatry

## 2023-02-12 DIAGNOSIS — Z62820 Parent-biological child conflict: Secondary | ICD-10-CM | POA: Diagnosis not present

## 2023-02-12 DIAGNOSIS — F331 Major depressive disorder, recurrent, moderate: Secondary | ICD-10-CM | POA: Diagnosis not present

## 2023-02-12 DIAGNOSIS — F411 Generalized anxiety disorder: Secondary | ICD-10-CM

## 2023-02-12 DIAGNOSIS — Z8659 Personal history of other mental and behavioral disorders: Secondary | ICD-10-CM | POA: Diagnosis not present
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
## 2023-02-24 ENCOUNTER — Ambulatory Visit: Payer: Medicare Other | Admitting: Psychiatry

## 2023-03-04 DIAGNOSIS — I700 Atherosclerosis of aorta: Secondary | ICD-10-CM | POA: Diagnosis not present

## 2023-03-04 DIAGNOSIS — K7460 Unspecified cirrhosis of liver: Secondary | ICD-10-CM | POA: Diagnosis not present

## 2023-03-04 DIAGNOSIS — L299 Pruritus, unspecified: Secondary | ICD-10-CM | POA: Diagnosis not present

## 2023-03-04 DIAGNOSIS — E7800 Pure hypercholesterolemia, unspecified: Secondary | ICD-10-CM | POA: Diagnosis not present

## 2023-03-04 DIAGNOSIS — Z125 Encounter for screening for malignant neoplasm of prostate: Secondary | ICD-10-CM | POA: Diagnosis not present

## 2023-03-04 DIAGNOSIS — I25118 Atherosclerotic heart disease of native coronary artery with other forms of angina pectoris: Secondary | ICD-10-CM | POA: Diagnosis not present

## 2023-03-04 DIAGNOSIS — Z0000 Encounter for general adult medical examination without abnormal findings: Secondary | ICD-10-CM | POA: Diagnosis not present

## 2023-03-09 DIAGNOSIS — K7469 Other cirrhosis of liver: Secondary | ICD-10-CM | POA: Diagnosis not present

## 2023-03-11 ENCOUNTER — Ambulatory Visit (INDEPENDENT_AMBULATORY_CARE_PROVIDER_SITE_OTHER): Payer: Medicare Other | Admitting: Psychiatry

## 2023-03-11 DIAGNOSIS — Z8659 Personal history of other mental and behavioral disorders: Secondary | ICD-10-CM

## 2023-03-11 DIAGNOSIS — Z62820 Parent-biological child conflict: Secondary | ICD-10-CM | POA: Diagnosis not present

## 2023-03-11 DIAGNOSIS — F331 Major depressive disorder, recurrent, moderate: Secondary | ICD-10-CM | POA: Diagnosis not present

## 2023-03-11 DIAGNOSIS — F411 Generalized anxiety disorder: Secondary | ICD-10-CM
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
## 2023-03-12 DIAGNOSIS — S39012A Strain of muscle, fascia and tendon of lower back, initial encounter: Secondary | ICD-10-CM | POA: Diagnosis not present

## 2023-03-13 ENCOUNTER — Telehealth: Payer: Self-pay | Admitting: *Deleted

## 2023-03-13 NOTE — Telephone Encounter (Signed)
Arthur Moore's last allergy injection was 0.40 cc on 11/20/2022. He is on every 4 weeks but was still weekly for buildup. Please advise what dose we can restart him on. Please forward to [place] clinical pool.

## 2023-03-16 NOTE — Telephone Encounter (Signed)
Restart at 0.1 ml and build up as previously arranged has been documented on patient's flowsheet for next allergy shot.

## 2023-03-30 ENCOUNTER — Ambulatory Visit (INDEPENDENT_AMBULATORY_CARE_PROVIDER_SITE_OTHER): Payer: Medicare Other | Admitting: *Deleted

## 2023-03-30 DIAGNOSIS — J309 Allergic rhinitis, unspecified: Secondary | ICD-10-CM | POA: Diagnosis not present

## 2023-03-31 ENCOUNTER — Ambulatory Visit (INDEPENDENT_AMBULATORY_CARE_PROVIDER_SITE_OTHER): Payer: Medicare Other | Admitting: Allergy and Immunology

## 2023-03-31 VITALS — Ht 70.28 in | Wt 181.8 lb

## 2023-03-31 DIAGNOSIS — J4540 Moderate persistent asthma, uncomplicated: Secondary | ICD-10-CM

## 2023-03-31 DIAGNOSIS — L299 Pruritus, unspecified: Secondary | ICD-10-CM | POA: Diagnosis not present

## 2023-03-31 DIAGNOSIS — K7581 Nonalcoholic steatohepatitis (NASH): Secondary | ICD-10-CM | POA: Diagnosis not present

## 2023-03-31 DIAGNOSIS — J3089 Other allergic rhinitis: Secondary | ICD-10-CM

## 2023-03-31 DIAGNOSIS — K219 Gastro-esophageal reflux disease without esophagitis: Secondary | ICD-10-CM

## 2023-03-31 MED ORDER — ALBUTEROL SULFATE HFA 108 (90 BASE) MCG/ACT IN AERS: 2.0000 | INHALATION_SPRAY | RESPIRATORY_TRACT | 1 refills | Status: DC | PRN

## 2023-03-31 MED ORDER — PANTOPRAZOLE SODIUM 40 MG PO TBEC: 40.0000 mg | DELAYED_RELEASE_TABLET | Freq: Two times a day (BID) | ORAL | 1 refills | Status: DC

## 2023-03-31 MED ORDER — BUDESONIDE-FORMOTEROL FUMARATE 160-4.5 MCG/ACT IN AERO: 2.0000 | INHALATION_SPRAY | Freq: Two times a day (BID) | RESPIRATORY_TRACT | 1 refills | Status: DC

## 2023-03-31 MED ORDER — CETIRIZINE HCL 10 MG PO TABS: 10.0000 mg | ORAL_TABLET | Freq: Every day | ORAL | 1 refills | Status: DC | PRN

## 2023-03-31 MED ORDER — FLUTICASONE PROPIONATE 50 MCG/ACT NA SUSP: 2.0000 | Freq: Every day | NASAL | 1 refills | Status: DC

## 2023-03-31 NOTE — Progress Notes (Unsigned)
[place] [facility] [place] [facility] [place] [facility] [place] - [place]   Follow-up Note  Referring Provider: Jackelyn Poling, DO Primary Provider: Jackelyn Poling, DO Date of Office Visit: 03/31/2023  Subjective:   Arthur Moore (DOB: 09-27-1950) is a 72 y.o. male who [facility] in re-evaluation of the following:  HPI: [name] returns to this clinic in evaluation of asthma, allergic rhinitis, LPR.  I last saw him in this clinic 06 January 2023.  Overall his respiratory tract has really been doing very well and he has not required a systemic steroid or antibiotic for any type of airway issue and he rarely uses a short acting bronchodilator.  He consistently uses Symbicort and Flonase.  Rarely does he require a short acting bronchodilator.  His reflux is under pretty good control at this point in time.  He has been using Protonix mostly 1 time per day.  He does not receive the flu vaccine.  He has had a prolonged history of a pruritic disorder and this has been under the care of a dermatologist and apparently when he received a steroid shot it did help his pruritus for period in time but it returned.  He tried topical clobetasol which has not helped.  He is use Zyrtec twice a day which has not helped.  He was not sure if stopping his immunotherapy which he did so this summer was responsible for a flare of his pruritic disorder and he restarted his immunotherapy.  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  Allergies as of 03/31/2023       Reactions   Atorvastatin Other (See Comments)   Calf pain. Other reaction(s): leb numbness   Zolpidem Other (See Comments)   Other reaction(s): Drowsy        Medication List    albuterol 108 (90 Base) MCG/ACT inhaler Commonly known as: VENTOLIN HFA Inhale 2 puffs into the lungs every 4 (four) hours as needed for wheezing or  shortness of breath.   ALPRAZolam 0.5 MG tablet Commonly known as: Xanax Take 1 tablet (0.5 mg total) by mouth 2 (two) times daily as needed for anxiety.   aspirin EC 81 MG tablet Take 81 mg by mouth daily.   budesonide 0.25 MG/2ML nebulizer solution Commonly known as: PULMICORT Take 2 mLs (0.25 mg total) by nebulization every 4 (four) hours as needed (During flare ups).   budesonide-formoterol 160-4.5 MCG/ACT inhaler Commonly known as: SYMBICORT Inhale 2 puffs into the lungs 2 (two) times daily. With spacer   cetirizine 10 MG tablet Commonly known as: ZyrTEC Allergy Take 1 tablet (10 mg total) by mouth daily as needed (can take an ectra dose during flare ups).   fluticasone 50 MCG/ACT nasal spray Commonly known as: FLONASE Place 2 sprays into both nostrils daily. 1-2 sprays each nostril 1 (ONE) time per day.   lamoTRIgine 200 MG tablet Commonly known as: LAMICTAL Take 1 tablet (200 mg total) by mouth 2 (two) times daily.   metoprolol tartrate 25 MG tablet Commonly known as: LOPRESSOR TAKE 1/2 TABLET BY MOUTH TWICE A DAY   nitroGLYCERIN 0.4 MG SL tablet Commonly known as: NITROSTAT Place 1 tablet (0.4 mg total) under the tongue every 5 (five) minutes as needed for chest pain.   pantoprazole 40 MG tablet Commonly known as: PROTONIX Take 1 tablet (40 mg total) by mouth 2 (two) times daily.  rosuvastatin 10 MG tablet Commonly known as: CRESTOR Take 1 tablet by mouth daily.   Spacer/Aero-Holding [employer] 1 Device by Does not apply route as directed.   UNABLE TO FIND Inject 0.5 mLs into the skin once a week.   Vitamin D3 125 MCG (5000 UT) Tabs Take 5,000 Units by mouth daily.   zinc gluconate 50 MG tablet Take 1 tablet (50 mg total) by mouth daily.    Past Medical History:  Diagnosis Date   Aortic insufficiency    Arthritis    Asthma    Carotid bruit 07/09/2021   Cervical disc disease    Depression    Diastolic dysfunction 06/20/2016   Hyperlipidemia  08/24/2015   Hypertension    Lumbar disc disease    Psoriasis     Past Surgical History:  Procedure Laterality Date   CHOLECYSTECTOMY  2012   HERNIA REPAIR     KNEE ARTHROSCOPY W/ MENISCAL REPAIR  11/2021   LEFT HEART CATH AND CORONARY ANGIOGRAPHY N/A 07/08/2018   Procedure: LEFT HEART CATH AND CORONARY ANGIOGRAPHY;  Surgeon: Kathleene Hazel, MD;  Location: [facility];  Service: Cardiovascular;  Laterality: N/A;   PAROTIDECTOMY Right 1992   PROSTATE SURGERY  2002, 2004   SINOSCOPY     SINUS EXPLORATION  2013   SPINE SURGERY  2013   L C7-T1 laminectomy and discectomy   TONSILLECTOMY      Review of systems negative except as noted in HPI / PMHx or noted below:  Review of Systems  Constitutional: Negative.   HENT: Negative.    Eyes: Negative.   Respiratory: Negative.    Cardiovascular: Negative.   Gastrointestinal: Negative.   Genitourinary: Negative.   Musculoskeletal: Negative.   Skin: Negative.   Neurological: Negative.   Endo/Heme/Allergies: Negative.   Psychiatric/Behavioral: Negative.       Objective:   There were no vitals filed for this visit. Height: 5' 10.28" (178.5 cm)  Weight: 181 lb 12.8 oz (82.5 kg)   Physical Exam Constitutional:      Appearance: He is not diaphoretic.  HENT:     Head: Normocephalic.     Right Ear: Tympanic membrane, ear canal and external ear normal.     Left Ear: Tympanic membrane, ear canal and external ear normal.     Nose: Nose normal. No mucosal edema or rhinorrhea.     Mouth/Throat:     Pharynx: Uvula midline. No oropharyngeal exudate.  Eyes:     Conjunctiva/sclera: Conjunctivae normal.  Neck:     Thyroid: No thyromegaly.     Trachea: Trachea normal. No tracheal tenderness or tracheal deviation.  Cardiovascular:     Rate and Rhythm: Normal rate and regular rhythm.     Heart sounds: Normal heart sounds, S1 normal and S2 normal. No murmur heard. Pulmonary:     Effort: No respiratory distress.     Breath  sounds: Normal breath sounds. No stridor. No wheezing or rales.  Lymphadenopathy:     Head:     Right side of head: No tonsillar adenopathy.     Left side of head: No tonsillar adenopathy.     Cervical: No cervical adenopathy.  Skin:    Findings: No erythema or rash.     Nails: There is no clubbing.  Neurological:     Mental Status: He is alert.     Diagnostics: Spirometry was performed and demonstrated an FEV1 of 2.68 at 85 % of predicted.  Results of blood tests obtained 09 March 2023 identifies the following: Creatinine 1.16 Mg/DL, AST 02I/O, ALT 97D/Z alpha-fetoprotein 2.7 NG/mL  Results of blood tests obtained 03 November 2022 identifies WBC 5.7, hemoglobin 15.1, platelet 148   Assessment and Plan:   1. Asthma, moderate persistent, well-controlled   2. Other allergic rhinitis   3. LPRD (laryngopharyngeal reflux disease)   4. Pruritic disorder   5. Metabolic dysfunction-associated steatohepatitis (MASH)     1. Continue Symbicort 160 - 2 puffs twice a day with spacer (empty lungs)  2. Continue Flonase 1-2 sprays each nostril one time per day   3. Continue Protonix 40 mg 1-2 times per day  4. Continue the following if needed:   A. Cetirizine 10 mg - 1 tablet 1-2 time per day    B. Symbicort 160 or albuterol HFA - 2 inhalations every 6 hours  5. Obtain CBC with differential, TSH, free T4, thyroid peroxidase antibody, alpha gal panel, antimitochondrial antibody, sed, CRP  6. Further treatment for pruritus???   7. Return to clinic in 6 months or earlier if problem  8. Influenza = Tamiflu, Covid = Paxlovid  [name] appears to be doing quite well regarding his airway issue and his reflux issue on his current plan and he will remain on anti-inflammatory agents for his airway and therapy directed against reflux.  His pruritus is getting worse and I have ordered some blood test to look for systemic disease contributing to his pruritus.  Certainly he has mash although there does  not appear to be an autoimmune disease contributing to this issue including a negative antimitochondrial antibody titer in 2018 but it is quite possible that his liver disease is contributing to some of his pruritic disorder.  We need to rule out a few other issues prior to making this assumption.  And then we have to come up with a plan to make him less itchy once we have the results of those blood tests.  I will contact him once we have those results available for review.  Laurette Schimke, MD Allergy / Immunology [facility]

## 2023-03-31 NOTE — Patient Instructions (Addendum)
  1. Continue Symbicort 160 - 2 puffs twice a day with spacer (empty lungs)  2. Continue Flonase 1-2 sprays each nostril one time per day   3. Continue Protonix 40 mg 1-2 times per day  4. Continue the following if needed:   A. Cetirizine 10 mg - 1 tablet 1-2 time per day    B. Symbicort 160 or albuterol HFA - 2 inhalations every 6 hours  5. Review blood tests; Further evaluation and treatment for pruritus???   6. Return to clinic in 6 months or earlier if problem  7. Influenza = Tamiflu, Covid = Paxlovid

## 2023-04-01 ENCOUNTER — Encounter: Payer: Self-pay | Admitting: Allergy and Immunology

## 2023-04-01 NOTE — Addendum Note (Signed)
Addended by: Orson Aloe on: 04/01/2023 05:33 PM   Modules accepted: Orders

## 2023-04-03 ENCOUNTER — Telehealth: Payer: Self-pay | Admitting: *Deleted

## 2023-04-03 DIAGNOSIS — L299 Pruritus, unspecified: Secondary | ICD-10-CM

## 2023-04-03 NOTE — Telephone Encounter (Signed)
-----   Message from ERIC J KOZLOW sent at 04/01/2023  7:21 AM EST ----- Please inform him that I looked over his previous studies and he does need a few follow-up blood tests for his pruritic disorder.  Please have him obtain CBC with differential, TSH, free T4, thyroid peroxidase antibody, alpha gal panel, antimitochondrial antibody, sed, CRP

## 2023-04-03 NOTE — Telephone Encounter (Signed)
Pt informed that he needs additional labs- orders place. He says he can come to the [place] office today as a walk in.

## 2023-04-06 ENCOUNTER — Ambulatory Visit (INDEPENDENT_AMBULATORY_CARE_PROVIDER_SITE_OTHER): Payer: Medicare Other | Admitting: *Deleted

## 2023-04-06 DIAGNOSIS — J309 Allergic rhinitis, unspecified: Secondary | ICD-10-CM | POA: Diagnosis not present

## 2023-04-06 DIAGNOSIS — L299 Pruritus, unspecified: Secondary | ICD-10-CM | POA: Diagnosis not present

## 2023-04-09 LAB — CBC WITH DIFFERENTIAL/PLATELET
Basophils Absolute: 0 10*3/uL (ref 0.0–0.2)
Basos: 1 %
EOS (ABSOLUTE): 0.4 10*3/uL (ref 0.0–0.4)
Eos: 8 %
Hematocrit: 43.7 % (ref 37.5–51.0)
Hemoglobin: 14.3 g/dL (ref 13.0–17.7)
Immature Grans (Abs): 0 10*3/uL (ref 0.0–0.1)
Immature Granulocytes: 0 %
Lymphocytes Absolute: 1.4 10*3/uL (ref 0.7–3.1)
Lymphs: 26 %
MCH: 29.6 pg (ref 26.6–33.0)
MCHC: 32.7 g/dL (ref 31.5–35.7)
MCV: 91 fL (ref 79–97)
Monocytes Absolute: 0.6 10*3/uL (ref 0.1–0.9)
Monocytes: 10 %
Neutrophils Absolute: 2.9 10*3/uL (ref 1.4–7.0)
Neutrophils: 55 %
Platelets: 114 10*3/uL — ABNORMAL LOW (ref 150–450)
RBC: 4.83 x10E6/uL (ref 4.14–5.80)
RDW: 12.3 % (ref 11.6–15.4)
WBC: 5.4 10*3/uL (ref 3.4–10.8)

## 2023-04-09 LAB — ALPHA-GAL PANEL: IgE (Immunoglobulin E), Serum: 22 [IU]/mL (ref 6–495)

## 2023-04-09 LAB — TSH+FREE T4
Free T4: 1.08 ng/dL (ref 0.82–1.77)
TSH: 1.54 u[IU]/mL (ref 0.450–4.500)

## 2023-04-09 LAB — MITOCHONDRIAL ANTIBODIES: Mitochondrial Ab: <20 U (ref 0.0–20.0)

## 2023-04-09 LAB — C-REACTIVE PROTEIN: CRP: 1 mg/L (ref 0–10)

## 2023-04-09 LAB — THYROID PEROXIDASE ANTIBODY: Thyroperoxidase Ab SerPl-aCnc: 21 [IU]/mL (ref 0–34)

## 2023-04-09 LAB — SEDIMENTATION RATE: Sed Rate: 2 mm/h (ref 0–30)

## 2023-04-14 ENCOUNTER — Ambulatory Visit: Payer: Medicare Other | Admitting: Psychiatry

## 2023-04-26 DIAGNOSIS — J029 Acute pharyngitis, unspecified: Secondary | ICD-10-CM | POA: Diagnosis not present

## 2023-04-26 DIAGNOSIS — J209 Acute bronchitis, unspecified: Secondary | ICD-10-CM | POA: Diagnosis not present

## 2023-04-26 DIAGNOSIS — R051 Acute cough: Secondary | ICD-10-CM | POA: Diagnosis not present

## 2023-04-26 DIAGNOSIS — R0981 Nasal congestion: Secondary | ICD-10-CM | POA: Diagnosis not present

## 2023-04-26 DIAGNOSIS — Z8709 Personal history of other diseases of the respiratory system: Secondary | ICD-10-CM | POA: Diagnosis not present

## 2023-04-26 DIAGNOSIS — Z03818 Encounter for observation for suspected exposure to other biological agents ruled out: Secondary | ICD-10-CM | POA: Diagnosis not present

## 2023-04-26 DIAGNOSIS — J069 Acute upper respiratory infection, unspecified: Secondary | ICD-10-CM | POA: Diagnosis not present

## 2023-04-26 DIAGNOSIS — R1031 Right lower quadrant pain: Secondary | ICD-10-CM | POA: Diagnosis not present

## 2023-05-04 ENCOUNTER — Encounter: Payer: Self-pay | Admitting: Adult Health

## 2023-05-04 ENCOUNTER — Ambulatory Visit: Payer: Medicare Other | Admitting: Adult Health

## 2023-05-04 DIAGNOSIS — F4310 Post-traumatic stress disorder, unspecified: Secondary | ICD-10-CM

## 2023-05-04 DIAGNOSIS — F39 Unspecified mood [affective] disorder: Secondary | ICD-10-CM | POA: Diagnosis not present

## 2023-05-04 DIAGNOSIS — F411 Generalized anxiety disorder: Secondary | ICD-10-CM | POA: Diagnosis not present

## 2023-05-04 NOTE — Progress Notes (Signed)
 Arthur Moore [id number] [birth year] 73 y.o.  Subjective:   Patient ID:  Arthur Moore is a 73 y.o. (DOB 1951/03/13) male.  Chief Complaint: No chief complaint on file.   HPI Arthur Moore presents to the office today for follow-up of Episodic Mood Disorder, PTSD, depression and GAD.  Accompanied by wife.  Reports recent psychiatric hospitalization in December at the [facility] in [place] - 5 days. [facility]  Describes mood today as ok. Pleasant. Denies tearfulness. Denies depression, anxiety and irritability. Denies panic attacks. Denies worry, rumination, and over thinking. Mood is stable. Stating I feel pretty even keel right now. Improved varying interest and motivation. Taking medications as prescribed.  Energy levels stable. Active, planning to work on a regular exercise routine.  Enjoys some usual interests and activities. Married. Lives with wife - 4 children between them. Spending time with family. Appetite stable. Weight gain - 175 to 184 pounds. Sleeps well most nights. Averages 10 to 11 hours.   Focus and concentration stable. Completing tasks. Managing aspects of household. Retired - [employer] veteran. Denies SI or HI.  Denies AH or VH. Denies self harm. Denies substance use.   PHQ2-9    Flowsheet Row Nutrition from 06/11/2017 in [facility]  - A Dept Of [place]. [facility]  PHQ-2 Total Score 0      Flowsheet Row ED from 09/07/2022 in [facility] Emergency Department at [facility] ED from 08/23/2022 in [facility] at [facility] ED from 08/06/2022 in [facility] Emergency Department at [facility]  C-SSRS RISK CATEGORY No Risk No Risk No Risk        Review of Systems:  Review of Systems  Musculoskeletal:  Negative for gait problem.  Neurological:  Negative for tremors.  Psychiatric/Behavioral:         Please refer to HPI    Medications: I have reviewed  the patient's current medications.  Current Outpatient Medications  Medication Sig Dispense Refill   albuterol  (VENTOLIN  HFA) 108 (90 Base) MCG/ACT inhaler Inhale 2 puffs into the lungs every 4 (four) hours as needed for wheezing or shortness of breath. 18 each 1   ALPRAZolam  (XANAX ) 0.5 MG tablet Take 1 tablet (0.5 mg total) by mouth 2 (two) times daily as needed for anxiety. 60 tablet 2   aspirin  EC 81 MG tablet Take 81 mg by mouth daily.     budesonide  (PULMICORT ) 0.25 MG/2ML nebulizer solution Take 2 mLs (0.25 mg total) by nebulization every 4 (four) hours as needed (During flare ups). 150 mL 1   budesonide -formoterol  (SYMBICORT ) 160-4.5 MCG/ACT inhaler Inhale 2 puffs into the lungs 2 (two) times daily. With spacer (empty lungs) 30.6 g 1   cetirizine  (ZYRTEC  ALLERGY) 10 MG tablet Take 1 tablet (10 mg total) by mouth daily as needed (can take an ectra dose during flare ups). 180 tablet 1   Cholecalciferol  (VITAMIN D3) 5000 units TABS Take 5,000 Units by mouth daily.      fluticasone  (FLONASE ) 50 MCG/ACT nasal spray Place 2 sprays into both nostrils daily. 1-2 sprays each nostril 1 (ONE) time per day. 48 g 1   lamoTRIgine  (LAMICTAL ) 200 MG tablet Take 1 tablet (200 mg total) by mouth 2 (two) times daily. 180 tablet 3   metoprolol  tartrate (LOPRESSOR ) 25 MG tablet TAKE 1/2 TABLET BY MOUTH TWICE A DAY 90 tablet 3   nitroGLYCERIN  (NITROSTAT ) 0.4 MG SL tablet Place 1 tablet (0.4  mg total) under the tongue every 5 (five) minutes as needed for chest pain. 25 tablet 3   pantoprazole  (PROTONIX ) 40 MG tablet Take 1 tablet (40 mg total) by mouth 2 (two) times daily. 180 tablet 1   rosuvastatin  (CRESTOR ) 10 MG tablet Take 1 tablet by mouth daily.     Spacer/Aero-Holding Chambers DEVI 1 Device by Does not apply route as directed. 1 each 1   UNABLE TO FIND Inject 0.5 mLs into the skin once a week.     zinc  gluconate 50 MG tablet Take 1 tablet (50 mg total) by mouth daily.     No current  facility-administered medications for this visit.    Medication Side Effects: None  Allergies:  Allergies  Allergen Reactions   Atorvastatin Other (See Comments)    Calf pain. Other reaction(s): leb numbness   Zolpidem  Other (See Comments)    Other reaction(s): Drowsy    Past Medical History:  Diagnosis Date   Aortic insufficiency    Arthritis    Asthma    Carotid bruit 07/09/2021   Cervical disc disease    Depression    Diastolic dysfunction 06/20/2016   Hyperlipidemia 08/24/2015   Hypertension    Lumbar disc disease    Psoriasis     Past Medical History, Surgical history, Social history, and Family history were reviewed and updated as appropriate.   Please see review of systems for further details on the patient's review from today.   Objective:   Physical Exam:  There were no vitals taken for this visit.  Physical Exam Constitutional:      General: He is not in acute distress. Musculoskeletal:        General: No deformity.  Neurological:     Mental Status: He is alert and oriented to person, place, and time.     Coordination: Coordination normal.  Psychiatric:        Attention and Perception: Attention and perception normal. He does not perceive auditory or visual hallucinations.        Mood and Affect: Affect is not labile, blunt, angry or inappropriate.        Speech: Speech normal.        Behavior: Behavior normal.        Thought Content: Thought content normal. Thought content is not paranoid or delusional. Thought content does not include homicidal or suicidal ideation. Thought content does not include homicidal or suicidal plan.        Cognition and Memory: Cognition and memory normal.        Judgment: Judgment normal.     Comments: Insight intact     Lab Review:     Component Value Date/Time   NA 143 07/02/2022 1040   K 4.2 07/02/2022 1040   CL 108 (H) 07/02/2022 1040   CO2 20 07/02/2022 1040   GLUCOSE 153 (H) 07/02/2022 1040   GLUCOSE 113 (H)  03/21/2022 1524   BUN 17 07/02/2022 1040   CREATININE 1.25 07/02/2022 1040   CALCIUM  9.1 07/02/2022 1040   PROT 6.6 10/18/2020 1130   ALBUMIN 4.7 10/18/2020 1130   AST 20 10/18/2020 1130   ALT 21 10/18/2020 1130   ALKPHOS 89 10/18/2020 1130   BILITOT 0.7 10/18/2020 1130   GFRNONAA 58 (L) 03/21/2022 1524   GFRAA >60 01/01/2020 0324       Component Value Date/Time   WBC 5.4 04/06/2023 1417   WBC 8.1 03/21/2022 1524   RBC 4.83 04/06/2023 1417   RBC 5.18  03/21/2022 1524   HGB 14.3 04/06/2023 1417   HGB 16.7 12/09/2016 1537   HCT 43.7 04/06/2023 1417   HCT 46.9 12/09/2016 1537   PLT 114 (L) 04/06/2023 1417   MCV 91 04/06/2023 1417   MCV 87.3 12/09/2016 1537   MCH 29.6 04/06/2023 1417   MCH 30.3 03/21/2022 1524   MCHC 32.7 04/06/2023 1417   MCHC 34.8 03/21/2022 1524   RDW 12.3 04/06/2023 1417   RDW 13.6 12/09/2016 1537   LYMPHSABS 1.4 04/06/2023 1417   LYMPHSABS 1.5 12/09/2016 1537   MONOABS 0.5 03/10/2022 0111   MONOABS 0.4 12/09/2016 1537   EOSABS 0.4 04/06/2023 1417   BASOSABS 0.0 04/06/2023 1417   BASOSABS 0.0 12/09/2016 1537    No results found for: POCLITH, LITHIUM   No results found for: PHENYTOIN, PHENOBARB, VALPROATE, CBMZ   .res Assessment: Plan:    Plan:  PDMP reviewed  Recently added at [facility]: Gabapentin 300mg  TID Zoloft  50mg  daily Xanax  0.25mg  daily - decreased from 0.5mg  BID  Lamictal  200mg  twice daily  Seeing therapist.  Plans to follow up with PCP for symptoms as well  RTC as needed  Patient advised to contact office with any questions, adverse effects, or acute worsening in signs and symptoms.  Counseled patient regarding potential benefits, risks, and side effects of Lamictal  to include potential risk of Stevens-Johnson syndrome. Advised patient to stop taking Lamictal  and contact office immediately if rash develops and to seek urgent medical attention if rash is severe and/or spreading quickly.   Discussed potential benefits,  risk, and side effects of benzodiazepines to include potential risk of tolerance and dependence, as well as possible drowsiness. Advised patient not to drive if experiencing drowsiness and to take lowest possible effective dose to minimize risk of dependence and tolerance.  There are no diagnoses linked to this encounter.   Please see After Visit Summary for patient specific instructions.  Future Appointments  Date Time Provider Department Center  05/04/2023 11:30 AM Peightyn Roberson Nattalie, NP [facility] None  05/14/2023  1:00 PM Marijean Charleston, PhD [facility] None  05/28/2023 11:30 AM Dina Camie BRAVO, PA-C [facility] None  06/11/2023  1:00 PM Marijean Charleston, PhD [facility] None  07/07/2023 10:30 AM Kozlow, Camellia PARAS, MD [facility] None  07/13/2023 11:00 AM [facility]-ECHO/VAS [facility]  09/08/2023  8:30 AM Richie Hussar, PhD [facility] None  09/08/2023  9:30 AM [facility]- NEUROPSYCH TECH [facility] None  10/02/2023 10:00 AM Richie Hussar, PhD [facility] None    No orders of the defined types were placed in this encounter.   -------------------------------

## 2023-05-05 ENCOUNTER — Ambulatory Visit (INDEPENDENT_AMBULATORY_CARE_PROVIDER_SITE_OTHER): Payer: Medicare Other | Admitting: *Deleted

## 2023-05-05 DIAGNOSIS — J309 Allergic rhinitis, unspecified: Secondary | ICD-10-CM | POA: Diagnosis not present

## 2023-05-07 ENCOUNTER — Other Ambulatory Visit: Payer: Self-pay | Admitting: Nurse Practitioner

## 2023-05-07 DIAGNOSIS — K7581 Nonalcoholic steatohepatitis (NASH): Secondary | ICD-10-CM | POA: Diagnosis not present

## 2023-05-07 DIAGNOSIS — K7469 Other cirrhosis of liver: Secondary | ICD-10-CM

## 2023-05-07 DIAGNOSIS — K766 Portal hypertension: Secondary | ICD-10-CM | POA: Diagnosis not present

## 2023-05-12 ENCOUNTER — Other Ambulatory Visit: Payer: Medicare Other

## 2023-05-14 ENCOUNTER — Ambulatory Visit: Payer: Medicare Other | Admitting: Psychiatry

## 2023-05-14 DIAGNOSIS — H6691 Otitis media, unspecified, right ear: Secondary | ICD-10-CM | POA: Diagnosis not present

## 2023-05-22 ENCOUNTER — Telehealth: Payer: Self-pay | Admitting: Allergy and Immunology

## 2023-05-22 ENCOUNTER — Ambulatory Visit (INDEPENDENT_AMBULATORY_CARE_PROVIDER_SITE_OTHER): Payer: Self-pay | Admitting: *Deleted

## 2023-05-22 ENCOUNTER — Ambulatory Visit
Admission: RE | Admit: 2023-05-22 | Discharge: 2023-05-22 | Disposition: A | Discharge status: Home / Self Care | Payer: Medicare Other | Source: Ambulatory Visit | Attending: Nurse Practitioner | Admitting: Nurse Practitioner

## 2023-05-22 DIAGNOSIS — K7460 Unspecified cirrhosis of liver: Secondary | ICD-10-CM | POA: Diagnosis not present

## 2023-05-22 DIAGNOSIS — K7469 Other cirrhosis of liver: Secondary | ICD-10-CM

## 2023-05-22 DIAGNOSIS — J309 Allergic rhinitis, unspecified: Secondary | ICD-10-CM | POA: Diagnosis not present

## 2023-05-22 DIAGNOSIS — Z9049 Acquired absence of other specified parts of digestive tract: Secondary | ICD-10-CM | POA: Diagnosis not present

## 2023-05-22 NOTE — Telephone Encounter (Signed)
Arthur Moore came  into the office and states that he wanted to let Dr. Lucie Leather know that he is down to 2 tablets a day of Zyrtec.  Arthur Moore states he stopped taking the Famotidine and is not longer taking it.  Arthur Moore states he found the source of the itching and it was a spray cologne.  FYI

## 2023-05-25 ENCOUNTER — Ambulatory Visit: Payer: Self-pay | Admitting: *Deleted

## 2023-05-25 DIAGNOSIS — R351 Nocturia: Secondary | ICD-10-CM | POA: Diagnosis not present

## 2023-05-25 DIAGNOSIS — N3943 Post-void dribbling: Secondary | ICD-10-CM | POA: Diagnosis not present

## 2023-05-25 DIAGNOSIS — K5900 Constipation, unspecified: Secondary | ICD-10-CM | POA: Diagnosis not present

## 2023-05-25 DIAGNOSIS — R3912 Poor urinary stream: Secondary | ICD-10-CM | POA: Diagnosis not present

## 2023-05-25 DIAGNOSIS — M62838 Other muscle spasm: Secondary | ICD-10-CM | POA: Diagnosis not present

## 2023-05-27 ENCOUNTER — Ambulatory Visit (INDEPENDENT_AMBULATORY_CARE_PROVIDER_SITE_OTHER): Payer: Self-pay

## 2023-05-27 DIAGNOSIS — J309 Allergic rhinitis, unspecified: Secondary | ICD-10-CM

## 2023-05-28 ENCOUNTER — Ambulatory Visit: Payer: Medicare Other | Admitting: Physician Assistant

## 2023-06-09 ENCOUNTER — Ambulatory Visit (INDEPENDENT_AMBULATORY_CARE_PROVIDER_SITE_OTHER): Payer: Medicare Other | Admitting: *Deleted

## 2023-06-09 DIAGNOSIS — J309 Allergic rhinitis, unspecified: Secondary | ICD-10-CM | POA: Diagnosis not present

## 2023-06-11 ENCOUNTER — Ambulatory Visit (INDEPENDENT_AMBULATORY_CARE_PROVIDER_SITE_OTHER): Payer: Medicare Other | Admitting: Psychiatry

## 2023-06-11 DIAGNOSIS — Z62820 Parent-biological child conflict: Secondary | ICD-10-CM

## 2023-06-11 DIAGNOSIS — F331 Major depressive disorder, recurrent, moderate: Secondary | ICD-10-CM | POA: Diagnosis not present

## 2023-06-11 DIAGNOSIS — Z8659 Personal history of other mental and behavioral disorders: Secondary | ICD-10-CM | POA: Diagnosis not present

## 2023-06-11 DIAGNOSIS — F411 Generalized anxiety disorder: Secondary | ICD-10-CM | POA: Diagnosis not present

## 2023-06-11 NOTE — Progress Notes (Signed)
 Psychotherapy Progress Note [facility] Delora Ferry, PhD LP  Patient ID: Arthur Moore)    MRN: [id number] Therapy format: Family therapy w/ patient -- accompanied by Arthur Moore Date: 06/11/2023      Start: 1:08p     Stop: 2:06p     Time Spent: 58 min Location: In-person   Session narrative (presenting needs, interim history, self-report of stressors and symptoms, applications of prior therapy, status changes, and interventions made in session) 3 months since last seen, at which time he was coping with a friend's Hospice death, fresh indications of son depressed and desperate, eerie feeling he's headed for a repeat the close of his father's life (last 2 wks, dying of cancer, after a long estrangement), and tentatively addressing whether to try to reopen a relationship with his estranged daughter Arthur Moore.    Today, comes with wife Arthur Moore, in mutual concern over son Arthur Moore, who will be coming soon.  Reminded how proselytizing about a relationship with God will only alienate when he needs calming.  Confirmed no need to fear he is trying to move back in, but he's hasty, running from his own feelings and invalidating the care he gets from his fiancee.  Oriented to asking him questions to draw out his feelings, try out interpretations or check perceptions, and otherwise get him to think about effects of his own choices, using thought experiments for him where possible.  Briefly addressed issue with Arthur Moore, still estranged, still not yet ready to make an offering but willing to think on it further.  Reiterated the value -- albeit challenging -- to make unconditional offer to clarify and give feedback and to hold off all blame and hints of preaching if he would truly like to have relationship back.  Though difficult to restrain all commentary about what would be right for Arthur Moore to do, he does get the value of keeping those notes silent for reparative  rapport.  Therapeutic modalities: Cognitive Behavioral Therapy, Solution-Oriented/Positive Psychology, Environmental manager, and Faith-sensitive  Mental Status/Observations:  Appearance:   Casual     Behavior:  Appropriate  Motor:  Normal  Speech/Language:   Clear and Coherent  Affect:  Appropriate  Mood:  dysthymic  Thought process:  normal  Thought content:    WNL  Sensory/Perceptual disturbances:    WNL  Orientation:  Fully oriented  Attention:  Good    Concentration:  Good  Memory:  grossly intact  Insight:    Variable  Judgment:   Good  Impulse Control:  Good   Risk Assessment: Danger to Self: No Self-injurious Behavior: No Danger to Others: No Physical Aggression / Violence: No Duty to Warn: No Access to Firearms a concern: No  Assessment of progress:  stabilized  Diagnosis:   ICD-10-CM   1. Major depressive disorder, recurrent episode, moderate (HCC)  F33.1     2. GAD (generalized anxiety disorder)  F41.1     3. History of posttraumatic stress disorder (PTSD)  Z86.59     4. Relationship problem between parent and child  Z62.820      Plan:  Family care and reconciliation issues -- Endorse handling of son's distress, continue as needed working collaboratively with him and prospective DIL.  Consider further overtures to D Arthur Moore, watch for cynical assumptions preventing the attempt. Self-care -- Continue helpful devotionals, gut health Spiritual -- Continue helpful practices of prayer, devotions, and carry through in further joining church and helpful small group.  As needed, prior advice to make prayer as  much dialogue as monologue. Consider further goals for therapy.  Can address intrusive thoughts, memories, anxiety coping skills, and family communication going forward, and raise meditation skills including mindfulness from a Christian orientation. Other recommendations/advice -- As may be noted above.  Continue to utilize previously learned skills ad  lib. Medication compliance -- Maintain medication as prescribed and work faithfully with relevant prescriber(s) if any changes are desired or seem indicated. Crisis service -- Aware of call list and work-in appts.  Call the clinic on-call service, 988/hotline, 911, or present to [facility] or ER if any life-threatening psychiatric crisis. Followup -- Return for time as available.  Next scheduled visit with me Visit date not found.  Next scheduled in this office Visit date not found.  Maretta Shaper, PhD Delora Ferry, PhD LP Clinical Psychologist, [employer] [facility] [address], [address] [place], [place] [address] [phone]

## 2023-06-12 DIAGNOSIS — M7711 Lateral epicondylitis, right elbow: Secondary | ICD-10-CM | POA: Diagnosis not present

## 2023-06-12 DIAGNOSIS — M79641 Pain in right hand: Secondary | ICD-10-CM | POA: Diagnosis not present

## 2023-06-12 DIAGNOSIS — M79642 Pain in left hand: Secondary | ICD-10-CM | POA: Diagnosis not present

## 2023-06-12 DIAGNOSIS — H9209 Otalgia, unspecified ear: Secondary | ICD-10-CM | POA: Diagnosis not present

## 2023-06-16 ENCOUNTER — Ambulatory Visit (INDEPENDENT_AMBULATORY_CARE_PROVIDER_SITE_OTHER): Payer: Self-pay | Admitting: *Deleted

## 2023-06-16 DIAGNOSIS — M79641 Pain in right hand: Secondary | ICD-10-CM | POA: Diagnosis not present

## 2023-06-16 DIAGNOSIS — M79642 Pain in left hand: Secondary | ICD-10-CM | POA: Diagnosis not present

## 2023-06-16 DIAGNOSIS — J309 Allergic rhinitis, unspecified: Secondary | ICD-10-CM

## 2023-06-16 DIAGNOSIS — M19042 Primary osteoarthritis, left hand: Secondary | ICD-10-CM | POA: Diagnosis not present

## 2023-06-16 DIAGNOSIS — M19041 Primary osteoarthritis, right hand: Secondary | ICD-10-CM | POA: Diagnosis not present

## 2023-06-23 ENCOUNTER — Ambulatory Visit (INDEPENDENT_AMBULATORY_CARE_PROVIDER_SITE_OTHER): Payer: Self-pay | Admitting: *Deleted

## 2023-06-23 DIAGNOSIS — J309 Allergic rhinitis, unspecified: Secondary | ICD-10-CM

## 2023-06-29 ENCOUNTER — Ambulatory Visit
Admission: RE | Admit: 2023-06-29 | Discharge: 2023-06-29 | Disposition: A | Discharge status: Home / Self Care | Source: Ambulatory Visit | Attending: Family Medicine | Admitting: Family Medicine

## 2023-06-29 ENCOUNTER — Other Ambulatory Visit: Payer: Self-pay

## 2023-06-29 VITALS — BP 122/72 | HR 65 | Temp 98.3°F | Resp 14

## 2023-06-29 DIAGNOSIS — B349 Viral infection, unspecified: Secondary | ICD-10-CM | POA: Diagnosis not present

## 2023-06-29 DIAGNOSIS — R350 Frequency of micturition: Secondary | ICD-10-CM

## 2023-06-29 LAB — POC COVID19/FLU A&B COMBO
Covid Antigen, POC: NEGATIVE
Influenza A Antigen, POC: NEGATIVE
Influenza B Antigen, POC: NEGATIVE

## 2023-06-29 LAB — POCT URINALYSIS DIP (MANUAL ENTRY)
Bilirubin, UA: NEGATIVE
Blood, UA: NEGATIVE
Glucose, UA: NEGATIVE mg/dL
Ketones, POC UA: NEGATIVE mg/dL
Leukocytes, UA: NEGATIVE
Nitrite, UA: NEGATIVE
Protein Ur, POC: NEGATIVE mg/dL
Spec Grav, UA: >=1.03 — AB (ref 1.010–1.025)
Urobilinogen, UA: 0.2 U/dL
pH, UA: 6 (ref 5.0–8.0)

## 2023-06-29 NOTE — ED Provider Notes (Signed)
 [facility]    CSN: [id number] Arrival date & time: 06/29/23  1546      History   Chief Complaint Chief Complaint  Patient presents with   Cough    HPI Arthur Moore is a 73 y.o. male  presents for evaluation of URI symptoms for 2 days. Patient reports associated symptoms of cough, congestion, sore throat, fatigue. Denies N/V/D, fevers, ear pain, body aches. Patient does have a hx of asthma.  Did use his home nebulizer today.  Also is on Symbicort.  Patient not not an active smoker.   Reports his wife was sick with similar symptoms last week.  He also endorses 2 days of urinary frequency and some low back pain.  No hematuria.   pt has taken nothing OTC for symptoms. Pt has no other concerns at this time.    Cough Associated symptoms: sore throat     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   Chest pain of uncertain etiology 07/08/2018   Chronic cough 06/21/2018   Thrombocytopenia (HCC) 12/01/2016   Transaminitis 12/01/2016   Diastolic dysfunction 06/20/2016   MDD (major depressive disorder), recurrent severe, 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   Hyperlipidemia 08/24/2015   Arthritis    Depression    Hypertension    Aortic insufficiency    Psoriasis    Cervical disc disease    Lumbar disc disease    Asthma 01/06/2015   Psoriatic arthritis (HCC) 01/06/2015   Allergic rhinoconjunctivitis  01/06/2015   GERD (gastroesophageal reflux disease) 01/06/2015   Laryngopharyngeal reflux (LPR) 01/06/2015    Past Surgical History:  Procedure Laterality Date   CHOLECYSTECTOMY  2012   HERNIA REPAIR     KNEE ARTHROSCOPY W/ MENISCAL REPAIR  11/2021   LEFT HEART CATH AND CORONARY ANGIOGRAPHY N/A 07/08/2018   Procedure: LEFT HEART CATH AND CORONARY ANGIOGRAPHY;  Surgeon: Kathleene Hazel, MD;  Location: [facility];  Service: Cardiovascular;  Laterality: N/A;   PAROTIDECTOMY Right 1992   PROSTATE SURGERY  2002, 2004   SINOSCOPY     SINUS EXPLORATION  2013   SPINE SURGERY  2013   L C7-T1 laminectomy and discectomy   TONSILLECTOMY         Home Medications    Prior to Admission medications   Medication Sig Start Date End Date Taking? Authorizing Provider  albuterol (VENTOLIN HFA) 108 (90 Base) MCG/ACT inhaler Inhale 2 puffs into the lungs every 4 (four) hours as needed for wheezing or shortness of breath. 03/31/23   Kozlow, Alvira Philips, MD  ALPRAZolam Prudy Feeler) 0.5 MG tablet Take 1 tablet (0.5 mg total) by mouth 2 (two) times daily as needed for anxiety. 12/30/22   Mozingo, Thereasa Solo, NP  aspirin EC 81 MG tablet Take 81 mg by mouth daily.    [provider]  budesonide (PULMICORT) 0.25 MG/2ML nebulizer solution  Take 2 mLs (0.25 mg total) by nebulization every 4 (four) hours as needed (During flare ups). 07/08/22   Kozlow, Alvira Philips, MD  budesonide-formoterol (SYMBICORT) 160-4.5 MCG/ACT inhaler Inhale 2 puffs into the lungs 2 (two) times daily. With spacer (empty lungs) 03/31/23   Kozlow, Alvira Philips, MD  cetirizine (ZYRTEC ALLERGY) 10 MG tablet Take 1 tablet (10 mg total) by mouth daily as needed (can take an ectra dose during flare ups). 03/31/23   Kozlow, Alvira Philips, MD  Cholecalciferol (VITAMIN D3) 5000 units TABS Take 5,000 Units by mouth daily.     [provider]  fluticasone (FLONASE) 50 MCG/ACT nasal spray Place 2 sprays into both nostrils daily. 1-2 sprays each  nostril 1 (ONE) time per day. 03/31/23   Kozlow, Alvira Philips, MD  lamoTRIgine (LAMICTAL) 200 MG tablet Take 1 tablet (200 mg total) by mouth 2 (two) times daily. 02/05/22   Mozingo, Thereasa Solo, NP  metoprolol tartrate (LOPRESSOR) 25 MG tablet TAKE 1/2 TABLET BY MOUTH TWICE A DAY 07/21/22   Chilton Si, MD  nitroGLYCERIN (NITROSTAT) 0.4 MG SL tablet Place 1 tablet (0.4 mg total) under the tongue every 5 (five) minutes as needed for chest pain. 07/11/22 03/31/23  Alver Sorrow, NP  pantoprazole (PROTONIX) 40 MG tablet Take 1 tablet (40 mg total) by mouth 2 (two) times daily. 03/31/23   Kozlow, Alvira Philips, MD  rosuvastatin (CRESTOR) 10 MG tablet Take 1 tablet by mouth daily.    [provider]  Spacer/Aero-Holding Chambers DEVI 1 Device by Does not apply route as directed. 01/06/23   Kozlow, Alvira Philips, MD  UNABLE TO FIND Inject 0.5 mLs into the skin once a week.    [provider]  zinc gluconate 50 MG tablet Take 1 tablet (50 mg total) by mouth daily. 07/26/18   Jodelle Gross, NP    Family History Family History  Problem Relation Age of Onset   Colon cancer Mother    Lung cancer Father    Cancer Other    Bipolar disorder Brother    Allergic rhinitis Neg Hx    Angioedema Neg Hx    Asthma Neg Hx    Eczema Neg Hx     Social History Social History   Tobacco Use   Smoking status: Never   Smokeless tobacco: Never  Vaping Use   Vaping status: Never Used  Substance Use Topics   Alcohol use: Not Currently   Drug use: No     Allergies   Atorvastatin and Zolpidem   Review of Systems Review of Systems  Constitutional:  Positive for fatigue.  HENT:  Positive for congestion and sore throat.   Respiratory:  Positive for cough.   Genitourinary:  Positive for frequency.     Physical Exam Triage Vital Signs ED Triage Vitals  Encounter Vitals Group     BP 06/29/23 1611 122/72     Systolic BP Percentile --      Diastolic BP Percentile --      Pulse Rate  06/29/23 1611 65     Resp 06/29/23 1611 14     Temp 06/29/23 1611 98.3 F (36.8 C)     Temp Source 06/29/23 1611 Oral     SpO2 06/29/23 1611 93 %     Weight --      Height --      Head Circumference --      Peak Flow --      Pain Score 06/29/23 1610 3  Pain Loc --      Pain Education --      Exclude from Growth Chart --    No data found.  Updated Vital Signs BP 122/72 (BP Location: Right Arm)   Pulse 65   Temp 98.3 F (36.8 C) (Oral)   Resp 14   SpO2 93%   Visual Acuity Right Eye Distance:   Left Eye Distance:   Bilateral Distance:    Right Eye Near:   Left Eye Near:    Bilateral Near:     Physical Exam Vitals and nursing note reviewed.  Constitutional:      General: He is not in acute distress.    Appearance: Normal appearance. He is not ill-appearing or toxic-appearing.  HENT:     Head: Normocephalic and atraumatic.     Right Ear: Tympanic membrane and ear canal normal.     Left Ear: Tympanic membrane and ear canal normal.     Nose: Congestion present.     Mouth/Throat:     Mouth: Mucous membranes are moist.     Pharynx: No oropharyngeal exudate or posterior oropharyngeal erythema.  Eyes:     Pupils: Pupils are equal, round, and reactive to light.  Cardiovascular:     Rate and Rhythm: Normal rate and regular rhythm.     Heart sounds: Normal heart sounds.  Pulmonary:     Effort: Pulmonary effort is normal.     Breath sounds: Normal breath sounds.  Abdominal:     Tenderness: There is no right CVA tenderness or left CVA tenderness.  Musculoskeletal:     Cervical back: Normal range of motion and neck supple.  Lymphadenopathy:     Cervical: No cervical adenopathy.  Skin:    General: Skin is warm and dry.  Neurological:     General: No focal deficit present.     Mental Status: He is alert and oriented to person, place, and time.  Psychiatric:        Mood and Affect: Mood normal.        Behavior: Behavior normal.      UC Treatments / Results   Labs (all labs ordered are listed, but only abnormal results are displayed) Labs Reviewed  POCT URINALYSIS DIP (MANUAL ENTRY) - Abnormal; Notable for the following components:      Result Value   Spec Grav, UA >=1.030 (*)    All other components within normal limits  POC COVID19/FLU A&B COMBO    EKG   Radiology No results found.  Procedures Procedures (including critical care time)  Medications Ordered in UC Medications - No data to display  Initial Impression / Assessment and Plan / UC Course  I have reviewed the triage vital signs and the nursing notes.  Pertinent labs & imaging results that were available during my care of the patient were reviewed by me and considered in my medical decision making (see chart for details).     Reviewed exam and symptoms with patient.  No red flags.  Negative rapid flu and COVID.  UA negative for UTI.  Discussed viral illness and symptomatic treatment.  PCP follow-up if symptoms do not improve.  ER precautions reviewed and patient verbalized understanding. Final Clinical Impressions(s) / UC Diagnoses   Final diagnoses:  Viral illness  Urinary frequency   Discharge Instructions   None    ED Prescriptions   None    PDMP not reviewed this encounter.   Radford Pax, NP 06/29/23 [phone]

## 2023-06-29 NOTE — ED Triage Notes (Signed)
 Patient states wife was sick last week with a virus and he now has the same symptoms. Cough, congestion, back pain, urinary frequency x 2 days.

## 2023-06-29 NOTE — Discharge Instructions (Addendum)
 Please treat your symptoms with over the counter cough medication, tylenol or ibuprofen, humidifier, and rest. Viral illnesses can last 7-14 days. Please follow up with your PCP if your symptoms are not improving. Please go to the ER for any worsening symptoms. This includes but is not limited to fever you can not control with tylenol or ibuprofen, you are not able to stay hydrated, you have shortness of breath or chest pain.  Thank you for choosing [facility] for your healthcare needs. I hope you feel better soon!

## 2023-07-02 DIAGNOSIS — J3081 Allergic rhinitis due to animal (cat) (dog) hair and dander: Secondary | ICD-10-CM

## 2023-07-02 NOTE — Progress Notes (Signed)
 VIAL 1 MADE 07-02-23. EXP 07-01-24

## 2023-07-03 DIAGNOSIS — J302 Other seasonal allergic rhinitis: Secondary | ICD-10-CM | POA: Diagnosis not present

## 2023-07-03 NOTE — Progress Notes (Signed)
 VIAL 2 MADE 07-03-23. EXP 07-02-24

## 2023-07-06 ENCOUNTER — Ambulatory Visit
Admission: RE | Admit: 2023-07-06 | Discharge: 2023-07-06 | Disposition: A | Discharge status: Home / Self Care | Source: Ambulatory Visit | Attending: Family Medicine | Admitting: Family Medicine

## 2023-07-06 ENCOUNTER — Encounter (HOSPITAL_BASED_OUTPATIENT_CLINIC_OR_DEPARTMENT_OTHER): Payer: Self-pay | Admitting: Cardiovascular Disease

## 2023-07-06 ENCOUNTER — Ambulatory Visit (INDEPENDENT_AMBULATORY_CARE_PROVIDER_SITE_OTHER): Admitting: Radiology

## 2023-07-06 ENCOUNTER — Encounter: Payer: Self-pay | Admitting: Family Medicine

## 2023-07-06 VITALS — BP 134/70 | HR 67 | Temp 98.0°F | Resp 16

## 2023-07-06 DIAGNOSIS — J22 Unspecified acute lower respiratory infection: Secondary | ICD-10-CM

## 2023-07-06 DIAGNOSIS — R051 Acute cough: Secondary | ICD-10-CM

## 2023-07-06 DIAGNOSIS — J45901 Unspecified asthma with (acute) exacerbation: Secondary | ICD-10-CM

## 2023-07-06 DIAGNOSIS — J3089 Other allergic rhinitis: Secondary | ICD-10-CM

## 2023-07-06 DIAGNOSIS — R059 Cough, unspecified: Secondary | ICD-10-CM | POA: Diagnosis not present

## 2023-07-06 DIAGNOSIS — I700 Atherosclerosis of aorta: Secondary | ICD-10-CM | POA: Diagnosis not present

## 2023-07-06 MED ORDER — PREDNISONE 20 MG PO TABS: 20.0000 mg | ORAL_TABLET | Freq: Every day | ORAL | 0 refills | Status: AC

## 2023-07-06 MED ORDER — AMOXICILLIN-POT CLAVULANATE 875-125 MG PO TABS
1.0000 | ORAL_TABLET | Freq: Two times a day (BID) | ORAL | 0 refills | Status: AC
Start: 2023-07-06 — End: 2023-07-16

## 2023-07-06 NOTE — Progress Notes (Signed)
 VIAL 3 MADE 07-06-23. EXP 07-05-24

## 2023-07-06 NOTE — ED Triage Notes (Addendum)
 Pt c/o cough that wont go away. He was sick about 1 week ago with virus but cough has lingered.

## 2023-07-06 NOTE — Discharge Instructions (Addendum)
 As discussed on my initial read of your x-ray and concern for pneumonia and middle right lobe.  Radiologist will review the read as read I will update you via MyChart.  Covering you for bronchopneumonia with prednisone 20 mg daily to reduce inflammation in your chest which is causing you to cough and have shortness of breath.  For pneumonia start Augmentin take twice daily for 10 days.  Complete nebulizer treatments every 6 hours as needed this will also facilitate improvement of your work of breathing open your airway which will reduce your cough.

## 2023-07-06 NOTE — ED Provider Notes (Signed)
 [facility]    CSN: [id number] Arrival date & time: 07/06/23  1110      History   Chief Complaint Chief Complaint  Patient presents with   Cough    HPI [facility] is a 73 y.o. male, with a history of asthma, heart disease, and hypertension resents today with worsening shortness of breath, cough, and feeling unwell.  Patient was seen at a different urgent care on 06/29/2023 diagnosed with a viral illness and had negative viral testing completed.  He reports he has been using his home Pulmicort nebulizer treatments with them for on arrival patient's oxygen level is 92%.  Cough is nonproductive. He has not experienced fever. 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   Chest pain of uncertain etiology 07/08/2018   Chronic cough 06/21/2018   Thrombocytopenia (HCC) 12/01/2016   Transaminitis 12/01/2016   Diastolic dysfunction 06/20/2016   MDD (major depressive disorder), recurrent severe, 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   Hyperlipidemia 08/24/2015   Arthritis    Depression    Hypertension    Aortic insufficiency    Psoriasis    Cervical disc disease    Lumbar disc disease    Asthma 01/06/2015   Psoriatic arthritis (HCC) 01/06/2015   Allergic rhinoconjunctivitis 01/06/2015   GERD (gastroesophageal reflux disease) 01/06/2015   Laryngopharyngeal reflux (LPR) 01/06/2015    Past Surgical  History:  Procedure Laterality Date   CHOLECYSTECTOMY  2012   HERNIA REPAIR     KNEE ARTHROSCOPY W/ MENISCAL REPAIR  11/2021   LEFT HEART CATH AND CORONARY ANGIOGRAPHY N/A 07/08/2018   Procedure: LEFT HEART CATH AND CORONARY ANGIOGRAPHY;  Surgeon: Kathleene Hazel, MD;  Location: [facility];  Service: Cardiovascular;  Laterality: N/A;   PAROTIDECTOMY Right 1992   PROSTATE SURGERY  2002, 2004   SINOSCOPY     SINUS EXPLORATION  2013   SPINE SURGERY  2013   L C7-T1 laminectomy and discectomy   TONSILLECTOMY         Home Medications    Prior to Admission medications   Medication Sig Start Date End Date Taking? Authorizing Provider  amoxicillin-clavulanate (AUGMENTIN) 875-125 MG tablet Take 1 tablet by mouth 2 (two) times daily for 10 days. 07/06/23 07/16/23 Yes Bing Neighbors, NP  predniSONE (DELTASONE) 20 MG tablet Take 1 tablet (20 mg total) by mouth daily with breakfast for 5 days. 07/06/23 07/11/23 Yes Bing Neighbors, NP  albuterol (VENTOLIN HFA) 108 (90 Base) MCG/ACT inhaler Inhale 2 puffs into the lungs every 4 (four) hours as needed for wheezing or shortness of breath. 03/31/23   Kozlow, Alvira Philips, MD  ALPRAZolam Prudy Feeler) 0.5 MG tablet Take 1 tablet (0.5 mg total) by mouth 2 (two) times daily as needed for anxiety. 12/30/22   Mozingo, Thereasa Solo, NP  aspirin EC 81 MG tablet Take 81 mg by mouth  daily.    [provider]  budesonide (PULMICORT) 0.25 MG/2ML nebulizer solution Take 2 mLs (0.25 mg total) by nebulization every 4 (four) hours as needed (During flare ups). 07/08/22   Kozlow, Alvira Philips, MD  budesonide-formoterol (SYMBICORT) 160-4.5 MCG/ACT inhaler Inhale 2 puffs into the lungs 2 (two) times daily. With spacer (empty lungs) 03/31/23   Kozlow, Alvira Philips, MD  cetirizine (ZYRTEC ALLERGY) 10 MG tablet Take 1 tablet (10 mg total) by mouth daily as needed (can take an ectra dose during flare ups). 03/31/23   Kozlow, Alvira Philips, MD  Cholecalciferol (VITAMIN D3) 5000  units TABS Take 5,000 Units by mouth daily.     [provider]  fluticasone (FLONASE) 50 MCG/ACT nasal spray Place 2 sprays into both nostrils daily. 1-2 sprays each nostril 1 (ONE) time per day. 03/31/23   Kozlow, Alvira Philips, MD  lamoTRIgine (LAMICTAL) 200 MG tablet Take 1 tablet (200 mg total) by mouth 2 (two) times daily. 02/05/22   Mozingo, Thereasa Solo, NP  metoprolol tartrate (LOPRESSOR) 25 MG tablet TAKE 1/2 TABLET BY MOUTH TWICE A DAY 07/21/22   Chilton Si, MD  nitroGLYCERIN (NITROSTAT) 0.4 MG SL tablet Place 1 tablet (0.4 mg total) under the tongue every 5 (five) minutes as needed for chest pain. 07/11/22 03/31/23  Alver Sorrow, NP  pantoprazole (PROTONIX) 40 MG tablet Take 1 tablet (40 mg total) by mouth 2 (two) times daily. 03/31/23   Kozlow, Alvira Philips, MD  rosuvastatin (CRESTOR) 10 MG tablet Take 1 tablet by mouth daily.    [provider]  Spacer/Aero-Holding Chambers DEVI 1 Device by Does not apply route as directed. 01/06/23   Kozlow, Alvira Philips, MD  UNABLE TO FIND Inject 0.5 mLs into the skin once a week.    [provider]  zinc gluconate 50 MG tablet Take 1 tablet (50 mg total) by mouth daily. 07/26/18   Jodelle Gross, NP    Family History Family History  Problem Relation Age of Onset   Colon cancer Mother    Lung cancer Father    Cancer Other    Bipolar disorder Brother    Allergic rhinitis Neg Hx    Angioedema Neg Hx    Asthma Neg Hx    Eczema Neg Hx     Social History Social History   Tobacco Use   Smoking status: Never   Smokeless tobacco: Never  Vaping Use   Vaping status: Never Used  Substance Use Topics   Alcohol use: Not Currently   Drug use: No     Allergies   Atorvastatin and Zolpidem   Review of Systems Review of Systems  Respiratory:  Positive for cough.      Physical Exam Triage Vital Signs ED Triage Vitals [07/06/23 1120]  Encounter Vitals Group     BP      Systolic BP Percentile      Diastolic BP  Percentile      Pulse      Resp      Temp      Temp src      SpO2      Weight      Height      Head Circumference      Peak Flow      Pain Score 0     Pain Loc      Pain Education      Exclude from Growth Chart    No data found.  Updated Vital Signs BP  134/70 (BP Location: Right Arm)   Pulse 67   Temp 98 F (36.7 C) (Oral)   Resp 16   SpO2 92%   Visual Acuity Right Eye Distance:   Left Eye Distance:   Bilateral Distance:    Right Eye Near:   Left Eye Near:    Bilateral Near:     Physical Exam Vitals reviewed.  Constitutional:      Appearance: Normal appearance.  HENT:     Head: Normocephalic and atraumatic.  Eyes:     Extraocular Movements: Extraocular movements intact.     Pupils: Pupils are equal, round, and reactive to light.  Cardiovascular:     Rate and Rhythm: Normal rate and regular rhythm.  Pulmonary:     Effort: No accessory muscle usage or prolonged expiration.     Breath sounds: Decreased air movement present. Examination of the right-upper field reveals rhonchi. Examination of the left-upper field reveals rhonchi. Examination of the right-middle field reveals rhonchi and rales. Examination of the left-middle field reveals rhonchi. Rhonchi and rales present. No wheezing.  Musculoskeletal:        General: Normal range of motion.  Skin:    General: Skin is warm and dry.     Capillary Refill: Capillary refill takes less than 2 seconds.  Neurological:     General: No focal deficit present.     Mental Status: He is alert and oriented to person, place, and time.      UC Treatments / Results  Labs (all labs ordered are listed, but only abnormal results are displayed) Labs Reviewed - No data to display  EKG   Radiology No results found.  Procedures Procedures (including critical care time)  Medications Ordered in UC Medications - No data to display  Initial Impression / Assessment and Plan / UC Course  I have reviewed the triage vital  signs and the nursing notes.  Pertinent labs & imaging results that were available during my care of the patient were reviewed by me and considered in my medical decision making (see chart for details).    Treating for lower respiratory infection, suspicious for right middle lobe pneumonia on initial read of chest x-ray clinical correlation with exam findings.  Patient advised that the radiology will complete a final read.  Will cover for pneumonia empirically with Augmentin twice daily for 10 days and prednisone 20 mg once daily for inflammation in the lungs which is causing shortness of breath and wheezing.  Patient educated to use his albuterol nebulizer solution for acute reactive airway exacerbations in addition to the Pulmicort.  Continue use of Pulmicort 2-3 times daily as needed.  Return precautions given if symptoms worsen or do not improve. Final Clinical Impressions(s) / UC Diagnoses   Final diagnoses:  Acute cough  Acute lower respiratory infection     Discharge Instructions      As discussed on my initial read of your x-ray and concern for pneumonia and middle right lobe.  Radiologist will review the read as read I will update you via MyChart.  Covering you for bronchopneumonia with prednisone 20 mg daily to reduce inflammation in your chest which is causing you to cough and have shortness of breath.  For pneumonia start Augmentin take twice daily for 10 days.  Complete nebulizer treatments every 6 hours as needed this will also facilitate improvement of your work of breathing open your airway which will reduce your cough.       ED Prescriptions  Medication Sig Dispense Auth. Provider   predniSONE (DELTASONE) 20 MG tablet Take 1 tablet (20 mg total) by mouth daily with breakfast for 5 days. 5 tablet Bing Neighbors, NP   amoxicillin-clavulanate (AUGMENTIN) 875-125 MG tablet Take 1 tablet by mouth 2 (two) times daily for 10 days. 20 tablet Bing Neighbors, NP       PDMP not reviewed this encounter.   Bing Neighbors, NP 07/06/23 [phone]

## 2023-07-07 ENCOUNTER — Other Ambulatory Visit: Payer: Self-pay

## 2023-07-07 ENCOUNTER — Ambulatory Visit (INDEPENDENT_AMBULATORY_CARE_PROVIDER_SITE_OTHER): Payer: Medicare Other | Admitting: Allergy and Immunology

## 2023-07-07 VITALS — BP 142/76 | HR 79 | Temp 98.2°F | Resp 16 | Ht 70.47 in

## 2023-07-07 DIAGNOSIS — K219 Gastro-esophageal reflux disease without esophagitis: Secondary | ICD-10-CM

## 2023-07-07 DIAGNOSIS — J988 Other specified respiratory disorders: Secondary | ICD-10-CM

## 2023-07-07 DIAGNOSIS — B9789 Other viral agents as the cause of diseases classified elsewhere: Secondary | ICD-10-CM

## 2023-07-07 DIAGNOSIS — L299 Pruritus, unspecified: Secondary | ICD-10-CM | POA: Diagnosis not present

## 2023-07-07 DIAGNOSIS — J4540 Moderate persistent asthma, uncomplicated: Secondary | ICD-10-CM

## 2023-07-07 DIAGNOSIS — J3089 Other allergic rhinitis: Secondary | ICD-10-CM | POA: Diagnosis not present

## 2023-07-07 MED ORDER — BUDESONIDE-FORMOTEROL FUMARATE 160-4.5 MCG/ACT IN AERO: 2.0000 | INHALATION_SPRAY | Freq: Two times a day (BID) | RESPIRATORY_TRACT | 1 refills | Status: DC

## 2023-07-07 MED ORDER — NEBULIZER MASK ADULT MISC: 1.0000 | 1 refills | Status: DC

## 2023-07-07 MED ORDER — ALBUTEROL SULFATE HFA 108 (90 BASE) MCG/ACT IN AERS: 2.0000 | INHALATION_SPRAY | RESPIRATORY_TRACT | 1 refills | Status: DC | PRN

## 2023-07-07 MED ORDER — SPACER/AERO-HOLDING CHAMBERS DEVI: 1.0000 | 1 refills | Status: DC

## 2023-07-07 MED ORDER — CETIRIZINE HCL 10 MG PO TABS: 10.0000 mg | ORAL_TABLET | Freq: Every day | ORAL | 1 refills | Status: DC | PRN

## 2023-07-07 MED ORDER — BUDESONIDE 0.25 MG/2ML IN SUSP: 0.2500 mg | RESPIRATORY_TRACT | 1 refills | Status: AC | PRN

## 2023-07-07 MED ORDER — PANTOPRAZOLE SODIUM 40 MG PO TBEC: 40.0000 mg | DELAYED_RELEASE_TABLET | Freq: Two times a day (BID) | ORAL | 1 refills | Status: DC

## 2023-07-07 NOTE — Patient Instructions (Addendum)
  1. Continue Symbicort 160 - 2 puffs twice a day with spacer (empty lungs)  2. Continue Flonase 1-2 sprays each nostril one time per day   3. Continue Protonix 40 mg 1-2 times per day  4. Continue the following if needed:   A. Cetirizine 10 mg - 1 tablet 1-2 time per day    B. Albuterol HFA - 2 inhalations every 6 hours  5. For this recent event:   A. Use the prednisone and Augmentin as prescribed  B. Use OTC mucinex DM - 2 times per day  C. Use budesonide 1 mg nebulized 4 times per day  D. Increase Protonix to 2 times per day  E. Can use nasal saline if needed  6. Influenza = Tamiflu. Covid = Paxlovid  7. Return to clinic in 6 months or earlier if problem

## 2023-07-07 NOTE — Telephone Encounter (Signed)
 Calcification of the thoracic aorta is the same as his previously noted atherosclerosis. No need for any changes at this time. His Aspirin, Metoprolol, Rosuvastatin, heart healthy diet, and exercise help prevent this from progressing.   Alver Sorrow, NP

## 2023-07-07 NOTE — Progress Notes (Unsigned)
 [place] - [place] - [place] - [place] - [place]   Follow-up Note  Referring Provider: Jackelyn Poling, DO Primary Provider: Jackelyn Poling, DO Date of Office Visit: 07/07/2023  Subjective:   [facility] (DOB: 01/10/1951) is a 73 y.o. male who [facility] in re-evaluation of the following:  HPI: [facility] returns to this clinic in evaluation of asthma, allergic rhinitis, LPR, and pruritic disorder . I last saw him in this clinic 31 March 2023.  He was doing wonderful with his upper and lower airway issue with minimal use of a SABA until he contrcted a viral respiratory illensss from his wife about 8 days ago with nasal congestion and clear rhinorhhea and coughing and wheezing without fever, ugly nasal discharge or sputum production. He went to the UC yesterday and was given prednisone and Augmentin.   He believes his reflux is under control.  His pruritic disorder is under good control at this point.   Allergies as of 07/07/2023   No Active Allergies      Medication List   albuterol 108 (90 Base) MCG/ACT inhaler Commonly known as: VENTOLIN HFA Inhale 2 puffs into the lungs every 4 (four) hours as needed for wheezing or shortness of breath.   amoxicillin-clavulanate 875-125 MG tablet Commonly known as: AUGMENTIN Take 1 tablet by mouth 2 (two) times daily for 10 days.   aspirin EC 81 MG tablet Take 81 mg by mouth daily.   budesonide 0.25 MG/2ML nebulizer solution Commonly known as: PULMICORT Take 2 mLs (0.25 mg total) by nebulization every 4 (four) hours as needed (During flare ups).   budesonide-formoterol 160-4.5 MCG/ACT inhaler Commonly known as: SYMBICORT Inhale 2 puffs into the lungs 2 (two) times daily. With spacer (empty lungs)   cetirizine 10 MG tablet Commonly known as: ZyrTEC Allergy Take 1 tablet (10 mg total) by mouth daily as needed (can take an ectra dose during flare ups).   fluticasone 50 MCG/ACT  nasal spray Commonly known as: FLONASE Place 2 sprays into both nostrils daily. 1-2 sprays each nostril 1 (ONE) time per day.   lamoTRIgine 200 MG tablet Commonly known as: LAMICTAL Take 1 tablet (200 mg total) by mouth 2 (two) times daily.   metoprolol tartrate 25 MG tablet Commonly known as: LOPRESSOR TAKE 1/2 TABLET BY MOUTH TWICE A DAY   nitroGLYCERIN 0.4 MG SL tablet Commonly known as: NITROSTAT Place 1 tablet (0.4 mg total) under the tongue every 5 (five) minutes as needed for chest pain.   predniSONE 20 MG tablet Commonly known as: DELTASONE Take 1 tablet (20 mg total) by mouth daily with breakfast for 5 days.   rosuvastatin 10 MG tablet Commonly known as: CRESTOR Take 1 tablet by mouth daily.   sertraline 50 MG tablet Commonly known as: ZOLOFT Take 100 mg by mouth every morning.   Spacer/Aero-Holding [employer] 1 Device by Does not apply route as directed.   UNABLE TO FIND Inject 0.5 mLs into the skin once a week.     Past Medical History:  Diagnosis Date   Aortic insufficiency    Arthritis    Asthma    Carotid bruit 07/09/2021   Cervical disc disease    Depression    Diastolic dysfunction 06/20/2016   Hyperlipidemia 08/24/2015   Hypertension    Lumbar disc disease    Psoriasis     Past Surgical History:  Procedure Laterality Date   CHOLECYSTECTOMY  2012   HERNIA REPAIR  KNEE ARTHROSCOPY W/ MENISCAL REPAIR  11/2021   LEFT HEART CATH AND CORONARY ANGIOGRAPHY N/A 07/08/2018   Procedure: LEFT HEART CATH AND CORONARY ANGIOGRAPHY;  Surgeon: Kathleene Hazel, MD;  Location: [facility];  Service: Cardiovascular;  Laterality: N/A;   PAROTIDECTOMY Right 1992   PROSTATE SURGERY  2002, 2004   SINOSCOPY     SINUS EXPLORATION  2013   SPINE SURGERY  2013   L C7-T1 laminectomy and discectomy   TONSILLECTOMY      Review of systems negative except as noted in HPI / PMHx or noted below:  Review of Systems  Constitutional: Negative.    HENT: Negative.    Eyes: Negative.   Respiratory: Negative.    Cardiovascular: Negative.   Gastrointestinal: Negative.   Genitourinary: Negative.   Musculoskeletal: Negative.   Skin: Negative.   Neurological: Negative.   Endo/Heme/Allergies: Negative.   Psychiatric/Behavioral: Negative.       Objective:   Vitals:   07/07/23 1030  BP: (!) 142/76  Pulse: 79  Resp: 16  Temp: 98.2 F (36.8 C)  SpO2: 94%   Height: 5' 10.47" (179 cm)      Physical Exam Constitutional:      Appearance: He is not diaphoretic.     Comments: Nasal voice, coughing  HENT:     Head: Normocephalic.     Right Ear: Tympanic membrane, ear canal and external ear normal.     Left Ear: Tympanic membrane, ear canal and external ear normal.     Nose: Nose normal. No mucosal edema or rhinorrhea.     Mouth/Throat:     Pharynx: Uvula midline. No oropharyngeal exudate.  Eyes:     Conjunctiva/sclera: Conjunctivae normal.  Neck:     Thyroid: No thyromegaly.     Trachea: Trachea normal. No tracheal tenderness or tracheal deviation.  Cardiovascular:     Rate and Rhythm: Normal rate and regular rhythm.     Heart sounds: Normal heart sounds, S1 normal and S2 normal. No murmur heard. Pulmonary:     Effort: No respiratory distress.     Breath sounds: No stridor. Wheezing (scattered inspiratory and expiratory wheezing and rhonci) present. No rales.  Lymphadenopathy:     Head:     Right side of head: No tonsillar adenopathy.     Left side of head: No tonsillar adenopathy.     Cervical: No cervical adenopathy.  Skin:    Findings: No erythema or rash.     Nails: There is no clubbing.  Neurological:     Mental Status: He is alert.     Diagnostics:  Results of a chest X-Ray obtained 06 July 2023 identifies the following:  Trachea is midline. Heart size normal. Thoracic aorta is calcified. Lungs are clear. No pleural fluid.  Results of blood tests obtained 06 April 2023 identifies negative  mitocondrial ab, negative alpha gal panel, TSH 1.540 UIU/ml, FT4 1.08 ng/ml, thyroid peroxidase antibody 21 u/ml . Assessment and Plan:   1. Not well controlled moderate persistent asthma   2. Other allergic rhinitis   3. LPRD (laryngopharyngeal reflux disease)   4. Pruritic disorder   5. Viral respiratory illness    1. Continue Symbicort 160 - 2 puffs twice a day with spacer (empty lungs)  2. Continue Flonase 1-2 sprays each nostril one time per day   3. Continue Protonix 40 mg 1-2 times per day  4. Continue the following if needed:   A. Cetirizine 10 mg - 1 tablet 1-2  time per day    B. Albuterol HFA - 2 inhalations every 6 hours  5. For this recent event:   A. Use the prednisone and Augmentin as prescribed  B. Use OTC mucinex DM - 2 times per day  C. Use budesonide 1 mg nebulized 4 times per day  D. Increase Protonix to 2 times per day  E. Can use nasal saline if needed  6. Influenza = Tamiflu. Covid = Paxlovid  7. Return to clinic in 6 months or earlier if problem  [facility] appears to be acutely infected with a viral illness and he can continue with the UC plan of therapy and we will also have restart nebulized steroids and also increase his PPI to 2 times per day as he will reflux with all his coughing. Assuming his does well with the therapy noted above, I will see him in this clinic in 6 months or earlier if problem.   Laurette Schimke, MD Allergy / Immunology [facility]

## 2023-07-07 NOTE — Telephone Encounter (Signed)
 Please review and recommend additional testing if needed

## 2023-07-08 ENCOUNTER — Encounter: Payer: Self-pay | Admitting: Allergy and Immunology

## 2023-07-09 DIAGNOSIS — L299 Pruritus, unspecified: Secondary | ICD-10-CM | POA: Diagnosis not present

## 2023-07-09 DIAGNOSIS — M79641 Pain in right hand: Secondary | ICD-10-CM | POA: Diagnosis not present

## 2023-07-09 DIAGNOSIS — H903 Sensorineural hearing loss, bilateral: Secondary | ICD-10-CM | POA: Diagnosis not present

## 2023-07-09 DIAGNOSIS — Z974 Presence of external hearing-aid: Secondary | ICD-10-CM | POA: Diagnosis not present

## 2023-07-09 DIAGNOSIS — M7711 Lateral epicondylitis, right elbow: Secondary | ICD-10-CM | POA: Diagnosis not present

## 2023-07-09 DIAGNOSIS — G4733 Obstructive sleep apnea (adult) (pediatric): Secondary | ICD-10-CM | POA: Diagnosis not present

## 2023-07-09 DIAGNOSIS — M79642 Pain in left hand: Secondary | ICD-10-CM | POA: Diagnosis not present

## 2023-07-13 ENCOUNTER — Encounter (HOSPITAL_BASED_OUTPATIENT_CLINIC_OR_DEPARTMENT_OTHER): Payer: Self-pay | Admitting: *Deleted

## 2023-07-13 ENCOUNTER — Ambulatory Visit (HOSPITAL_BASED_OUTPATIENT_CLINIC_OR_DEPARTMENT_OTHER): Payer: Medicare Other

## 2023-07-13 ENCOUNTER — Other Ambulatory Visit (HOSPITAL_BASED_OUTPATIENT_CLINIC_OR_DEPARTMENT_OTHER): Payer: Self-pay | Admitting: *Deleted

## 2023-07-13 DIAGNOSIS — I351 Nonrheumatic aortic (valve) insufficiency: Secondary | ICD-10-CM

## 2023-07-13 DIAGNOSIS — I10 Essential (primary) hypertension: Secondary | ICD-10-CM | POA: Diagnosis not present

## 2023-07-13 DIAGNOSIS — E782 Mixed hyperlipidemia: Secondary | ICD-10-CM | POA: Diagnosis not present

## 2023-07-13 LAB — ECHOCARDIOGRAM COMPLETE
AV Vena cont: 0.37 cm
Area-P 1/2: 3.03 cm2
P 1/2 time: 697 ms
S' Lateral: 3.39 cm

## 2023-07-14 ENCOUNTER — Telehealth (HOSPITAL_BASED_OUTPATIENT_CLINIC_OR_DEPARTMENT_OTHER): Payer: Self-pay

## 2023-07-14 ENCOUNTER — Ambulatory Visit
Admission: RE | Admit: 2023-07-14 | Discharge: 2023-07-14 | Disposition: A | Discharge status: Home / Self Care | Source: Ambulatory Visit | Attending: Family Medicine | Admitting: Family Medicine

## 2023-07-14 ENCOUNTER — Institutional Professional Consult (permissible substitution): Payer: Medicare Other | Admitting: Psychology

## 2023-07-14 ENCOUNTER — Ambulatory Visit: Payer: Medicare Other

## 2023-07-14 VITALS — BP 124/74 | HR 75 | Temp 98.5°F | Resp 17

## 2023-07-14 DIAGNOSIS — R1031 Right lower quadrant pain: Secondary | ICD-10-CM | POA: Diagnosis not present

## 2023-07-14 DIAGNOSIS — R300 Dysuria: Secondary | ICD-10-CM | POA: Diagnosis not present

## 2023-07-14 DIAGNOSIS — I351 Nonrheumatic aortic (valve) insufficiency: Secondary | ICD-10-CM

## 2023-07-14 LAB — COMPREHENSIVE METABOLIC PANEL
ALT: 57 IU/L — ABNORMAL HIGH (ref 0–44)
AST: 45 IU/L — ABNORMAL HIGH (ref 0–40)
Albumin: 4.6 g/dL (ref 3.8–4.8)
Alkaline Phosphatase: 84 IU/L (ref 44–121)
BUN/Creatinine Ratio: 14 (ref 10–24)
BUN: 21 mg/dL (ref 8–27)
Bilirubin Total: 0.8 mg/dL (ref 0.0–1.2)
CO2: 21 mmol/L (ref 20–29)
Calcium: 9.1 mg/dL (ref 8.6–10.2)
Chloride: 104 mmol/L (ref 96–106)
Creatinine, Ser: 1.47 mg/dL — ABNORMAL HIGH (ref 0.76–1.27)
Globulin, Total: 1.7 g/dL (ref 1.5–4.5)
Glucose: 88 mg/dL (ref 70–99)
Potassium: 4.5 mmol/L (ref 3.5–5.2)
Sodium: 142 mmol/L (ref 134–144)
Total Protein: 6.3 g/dL (ref 6.0–8.5)
eGFR: 50 mL/min/{1.73_m2} — ABNORMAL LOW (ref >59)

## 2023-07-14 LAB — LIPID PANEL
Chol/HDL Ratio: 3.3 ratio (ref 0.0–5.0)
Cholesterol, Total: 101 mg/dL (ref 100–199)
HDL: 31 mg/dL — ABNORMAL LOW (ref >39)
LDL Chol Calc (NIH): 41 mg/dL (ref 0–99)
Triglycerides: 175 mg/dL — ABNORMAL HIGH (ref 0–149)
VLDL Cholesterol Cal: 29 mg/dL (ref 5–40)

## 2023-07-14 LAB — POCT URINALYSIS DIP (MANUAL ENTRY)
Bilirubin, UA: NEGATIVE
Glucose, UA: NEGATIVE mg/dL
Ketones, POC UA: NEGATIVE mg/dL
Leukocytes, UA: NEGATIVE
Nitrite, UA: NEGATIVE
Protein Ur, POC: NEGATIVE mg/dL
Spec Grav, UA: 1.02 (ref 1.010–1.025)
Urobilinogen, UA: 0.2 U/dL
pH, UA: 6 (ref 5.0–8.0)

## 2023-07-14 LAB — POCT FASTING CBG KUC MANUAL ENTRY: POCT Glucose (KUC): 121 mg/dL — AB (ref 70–99)

## 2023-07-14 NOTE — ED Triage Notes (Signed)
 Pt presents with c/o lower abd pressure, pt states he is unsure if he has a UTI. 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

## 2023-07-14 NOTE — Telephone Encounter (Signed)
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  It is moderate compared with mild before.  We don't need to do anything to repair it unless it is severe.  Repeat echo in 6 months.

## 2023-07-14 NOTE — ED Provider Notes (Signed)
 [facility]    CSN: [id number] Arrival date & time: 07/14/23  1759      History   Chief Complaint Chief Complaint  Patient presents with   Abdominal Pain    HPI Arthur Moore is a 73 y.o. male presents for evaluation of possible UTI/lower abdominal pain.  Patient reports several days of a right lower/suprapubic abdominal pain that he describes as a pressure pain.  He does endorse some urinary burning, urgency and frequency but denies hematuria, fevers, nausea/vomiting, diarrhea, flank pain.  No penile discharge or testicular pain or swelling.  Denies STD concern or exposure.  No history of UTIs.  He does report he is prediabetic and is felt like he is hypoglycemic recently.  Does not check his sugars regularly.  Has not taken any OTC medications for symptoms since onset.  No other concerns at this time.   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   Chest pain of uncertain etiology 07/08/2018   Chronic cough 06/21/2018   Thrombocytopenia (HCC) 12/01/2016   Transaminitis 12/01/2016   Diastolic dysfunction 06/20/2016   MDD (major depressive disorder), recurrent severe, 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   Hyperlipidemia 08/24/2015   Arthritis    Depression    Hypertension    Aortic insufficiency    Psoriasis    Cervical disc disease    Lumbar disc  disease    Asthma 01/06/2015   Psoriatic arthritis (HCC) 01/06/2015   Allergic rhinoconjunctivitis 01/06/2015   GERD (gastroesophageal reflux disease) 01/06/2015   Laryngopharyngeal reflux (LPR) 01/06/2015    Past Surgical History:  Procedure Laterality Date   CHOLECYSTECTOMY  2012   HERNIA REPAIR     KNEE ARTHROSCOPY W/ MENISCAL REPAIR  11/2021   LEFT HEART CATH AND CORONARY ANGIOGRAPHY N/A 07/08/2018   Procedure: LEFT HEART CATH AND CORONARY ANGIOGRAPHY;  Surgeon: Kathleene Hazel, MD;  Location: [facility];  Service: Cardiovascular;  Laterality: N/A;   PAROTIDECTOMY Right 1992   PROSTATE SURGERY  2002, 2004   SINOSCOPY     SINUS EXPLORATION  2013   SPINE SURGERY  2013   L C7-T1 laminectomy and discectomy   TONSILLECTOMY         Home Medications    Prior to Admission medications   Medication Sig Start Date End Date Taking? Authorizing Provider  albuterol (VENTOLIN HFA) 108 (90 Base) MCG/ACT inhaler Inhale 2 puffs into the lungs every 4 (four) hours as needed for wheezing or shortness of breath. 07/07/23   Kozlow, Alvira Philips, MD  amoxicillin-clavulanate (AUGMENTIN) 875-125 MG tablet Take 1 tablet by mouth 2 (two) times daily for 10 days. 07/06/23 07/16/23  Bing Neighbors, NP  aspirin EC 81 MG tablet Take 81 mg by mouth daily.    [provider]  budesonide (PULMICORT) 0.25 MG/2ML nebulizer  solution Take 2 mLs (0.25 mg total) by nebulization every 4 (four) hours as needed (During flare ups). 07/07/23   Kozlow, Alvira Philips, MD  budesonide-formoterol (SYMBICORT) 160-4.5 MCG/ACT inhaler Inhale 2 puffs into the lungs 2 (two) times daily. With spacer (empty lungs) 07/07/23   Kozlow, Alvira Philips, MD  cetirizine (ZYRTEC ALLERGY) 10 MG tablet Take 1 tablet (10 mg total) by mouth daily as needed (can take an extra dose during flare ups). 07/07/23   Kozlow, Alvira Philips, MD  fluticasone (FLONASE) 50 MCG/ACT nasal spray Place 2 sprays into both nostrils daily. 1-2 sprays each nostril 1  (ONE) time per day. 03/31/23   Kozlow, Alvira Philips, MD  lamoTRIgine (LAMICTAL) 200 MG tablet Take 1 tablet (200 mg total) by mouth 2 (two) times daily. 02/05/22   Mozingo, Thereasa Solo, NP  metoprolol tartrate (LOPRESSOR) 25 MG tablet TAKE 1/2 TABLET BY MOUTH TWICE A DAY 07/21/22   Chilton Si, MD  nitroGLYCERIN (NITROSTAT) 0.4 MG SL tablet Place 1 tablet (0.4 mg total) under the tongue every 5 (five) minutes as needed for chest pain. 07/11/22 03/31/23  Alver Sorrow, NP  pantoprazole (PROTONIX) 40 MG tablet Take 1 tablet (40 mg total) by mouth 2 (two) times daily. 07/07/23   Kozlow, Alvira Philips, MD  Respiratory Therapy Supplies (NEBULIZER MASK ADULT) MISC 1 Device by Does not apply route as directed. 07/07/23   Kozlow, Alvira Philips, MD  rosuvastatin (CRESTOR) 10 MG tablet Take 1 tablet by mouth daily.    [provider]  sertraline (ZOLOFT) 50 MG tablet Take 100 mg by mouth every morning. 05/26/23   [provider]  Spacer/Aero-Holding Deretha Emory DEVI 1 Device by Does not apply route as directed. 07/07/23   Kozlow, Alvira Philips, MD  UNABLE TO FIND Inject 0.5 mLs into the skin once a week.    [provider]    Family History Family History  Problem Relation Age of Onset   Colon cancer Mother    Lung cancer Father    Cancer Other    Bipolar disorder Brother    Allergic rhinitis Neg Hx    Angioedema Neg Hx    Asthma Neg Hx    Eczema Neg Hx     Social History Social History   Tobacco Use   Smoking status: Never   Smokeless tobacco: Never  Vaping Use   Vaping status: Never Used  Substance Use Topics   Alcohol use: Not Currently   Drug use: No     Allergies   Patient has no known allergies.   Review of Systems Review of Systems  Gastrointestinal:  Positive for abdominal pain.  Genitourinary:  Positive for dysuria.     Physical Exam Triage Vital Signs ED Triage Vitals [07/14/23 1808]  Encounter Vitals Group     BP 124/74     Systolic BP Percentile       Diastolic BP Percentile      Pulse Rate 75     Resp 17     Temp 98.5 F (36.9 C)     Temp Source Oral     SpO2 94 %     Weight      Height      Head Circumference      Peak Flow      Pain Score 4     Pain Loc      Pain Education      Exclude from Growth Chart    No data found.  Updated Vital Signs  BP 124/74 (BP Location: Right Arm)   Pulse 75   Temp 98.5 F (36.9 C) (Oral)   Resp 17   SpO2 94%   Visual Acuity Right Eye Distance:   Left Eye Distance:   Bilateral Distance:    Right Eye Near:   Left Eye Near:    Bilateral Near:     Physical Exam Vitals and nursing note reviewed.  Constitutional:      General: He is not in acute distress.    Appearance: Normal appearance. He is not ill-appearing.  HENT:     Head: Normocephalic and atraumatic.  Eyes:     Pupils: Pupils are equal, round, and reactive to light.  Cardiovascular:     Rate and Rhythm: Normal rate.  Pulmonary:     Effort: Pulmonary effort is normal.  Abdominal:     General: Bowel sounds are normal.     Palpations: Abdomen is soft.     Tenderness: There is abdominal tenderness in the right lower quadrant. There is no right CVA tenderness, left CVA tenderness or rebound. Negative signs include Murphy's sign and Rovsing's sign.  Skin:    General: Skin is warm and dry.  Neurological:     General: No focal deficit present.     Mental Status: He is alert and oriented to person, place, and time.  Psychiatric:        Mood and Affect: Mood normal.        Behavior: Behavior normal.      UC Treatments / Results  Labs (all labs ordered are listed, but only abnormal results are displayed) Labs Reviewed  POCT URINALYSIS DIP (MANUAL ENTRY) - Abnormal; Notable for the following components:      Result Value   Blood, UA trace-intact (*)    All other components within normal limits  POCT FASTING CBG [facility] MANUAL ENTRY - Abnormal; Notable for the following components:   POCT Glucose ([facility]) 121 (*)    All  other components within normal limits  URINE CULTURE    EKG   Radiology ECHOCARDIOGRAM COMPLETE Result Date: 07/13/2023    ECHOCARDIOGRAM REPORT   Patient Name:   AURTHUR WINGERTER Date of Exam: 07/13/2023 Medical Rec #:  [id number]         Height:       70.5 in Accession #:    [id number]        Weight:       181.8 lb Date of Birth:  08-Jul-1950         BSA:          2.014 m Patient Age:    73 years          BP:           142/76 mmHg Patient Gender: M                 HR:           64 bpm. Exam Location:  Outpatient Procedure: 2D Echo, 3D Echo, Color Doppler, Cardiac Doppler and Strain Analysis            (Both Spectral and Color Flow Doppler were utilized during            procedure). Indications:    Aortic insufficiency  History:        Patient has prior history of Echocardiogram examinations, most                 recent 07/09/2022. CAD, Arrythmias:PVC and PAC; Risk  Factors:Non-Smoker and Hypertension. Mild-moderate aortic                 insufficiency.  Sonographer:    Jeryl Columbia RDCS Referring Phys: [id number] TIFFANY [place] IMPRESSIONS  1. Left ventricular ejection fraction, by estimation, is 50 to 55%. The left ventricle has low normal function. The left ventricle has no regional wall motion abnormalities. Left ventricular diastolic parameters are consistent with Grade I diastolic dysfunction (impaired relaxation). The average left ventricular global longitudinal strain is 14.7 %.  2. Right ventricular systolic function is normal. The right ventricular size is normal. Tricuspid regurgitation signal is inadequate for assessing PA pressure.  3. The mitral valve is normal in structure. No evidence of mitral valve regurgitation.  4. Eccentric jet towards the anterior mitral valve leaflet. The aortic valve is normal in structure. Aortic valve regurgitation is moderate.  5. The inferior vena cava is normal in size with greater than 50% respiratory variability, suggesting right atrial pressure  of 3 mmHg. Conclusion(s)/Recommendation(s): AI appears mildly worse compared to prior study 07/09/2022. FINDINGS  Left Ventricle: Left ventricular ejection fraction, by estimation, is 50 to 55%. The left ventricle has low normal function. The left ventricle has no regional wall motion abnormalities. The average left ventricular global longitudinal strain is 14.7 %.  Strain was performed and the global longitudinal strain is indeterminate. The left ventricular internal cavity size was normal in size. There is no left ventricular hypertrophy. Left ventricular diastolic parameters are consistent with Grade I diastolic  dysfunction (impaired relaxation). Right Ventricle: The right ventricular size is normal. Right ventricular systolic function is normal. Tricuspid regurgitation signal is inadequate for assessing PA pressure. Left Atrium: Left atrial size was normal in size. Right Atrium: Right atrial size was normal in size. Pericardium: There is no evidence of pericardial effusion. Mitral Valve: The mitral valve is normal in structure. No evidence of mitral valve regurgitation. Tricuspid Valve: Tricuspid valve regurgitation is mild. Aortic Valve: Eccentric jet towards the anterior mitral valve leaflet. The aortic valve is normal in structure. Aortic valve regurgitation is moderate. Aortic regurgitation PHT measures 697 msec. Pulmonic Valve: Pulmonic valve regurgitation is mild. Aorta: The aortic root and ascending aorta are structurally normal, with no evidence of dilitation. Venous: The inferior vena cava is normal in size with greater than 50% respiratory variability, suggesting right atrial pressure of 3 mmHg. IAS/Shunts: The interatrial septum was not well visualized.  LEFT VENTRICLE PLAX 2D LVIDd:         5.28 cm   Diastology LVIDs:         3.39 cm   LV e' medial:    5.66 cm/s LV PW:         0.95 cm   LV E/e' medial:  11.1 LV IVS:        0.92 cm   LV e' lateral:   10.30 cm/s LVOT diam:     2.00 cm   LV E/e'  lateral: 6.1 LV SV:         65 LV SV Index:   32        2D Longitudinal Strain LVOT Area:     3.14 cm  2D Strain GLS (A4C):   13.3 %                          2D Strain GLS (A3C):   13.8 %  2D Strain GLS (A2C):   17.0 %                          2D Strain GLS Avg:     14.7 %                           3D Volume EF:                          3D EF:        51 %                          LV EDV:       146 ml                          LV ESV:       71 ml                          LV SV:        75 ml RIGHT VENTRICLE RV Basal diam:  4.06 cm RV Mid diam:    3.39 cm RV S prime:     9.25 cm/s TAPSE (M-mode): 2.6 cm LEFT ATRIUM             Index        RIGHT ATRIUM           Index LA diam:        3.70 cm 1.84 cm/m   RA Area:     17.50 cm LA Vol (A2C):   47.2 ml 23.43 ml/m  RA Volume:   48.70 ml  24.18 ml/m LA Vol (A4C):   23.7 ml 11.77 ml/m LA Biplane Vol: 35.6 ml 17.67 ml/m  AORTIC VALVE LVOT Vmax:         93.90 cm/s LVOT Vmean:        61.900 cm/s LVOT VTI:          0.206 m AI PHT:            697 msec AR Vena Contracta: 0.37 cm  AORTA Ao Root diam: 3.50 cm Ao Asc diam:  3.50 cm MITRAL VALVE MV Area (PHT): 3.03 cm    SHUNTS MV Decel Time: 250 msec    Systemic VTI:  0.21 m MV E velocity: 63.10 cm/s  Systemic Diam: 2.00 cm MV A velocity: 98.50 cm/s MV E/A ratio:  0.64 Photographer signed by Carolan Clines Signature Date/Time: 07/13/2023/2:09:21 PM    Final     Procedures Procedures (including critical care time)  Medications Ordered in UC Medications - No data to display  Initial Impression / Assessment and Plan / UC Course  I have reviewed the triage vital signs and the nursing notes.  Pertinent labs & imaging results that were available during my care of the patient were reviewed by me and considered in my medical decision making (see chart for details).     I reviewed exam and symptoms with patient.  UA negative for UTI, will culture given symptoms.  Blood sugar 121  nonfasting.  Patient does have significant right lower quadrant tenderness with palpation.   Discussed concern for possible appendicitis given symptoms.  Advised I cannot rule this out in the setting and I advised he  go to the emergency room for further workup.  He states he will discuss it with his wife.  He was discharged in stable condition and in no acute distress. Final Clinical Impressions(s) / UC Diagnoses   Final diagnoses:  Dysuria  Right lower quadrant abdominal pain     Discharge Instructions      Please go to the emergency room for further workup of your abdominal pain     ED Prescriptions   None    PDMP not reviewed this encounter.   Radford Pax, NP 07/14/23 [phone]

## 2023-07-14 NOTE — Discharge Instructions (Signed)
Please go to the emergency room for further work up of your abdominal pain

## 2023-07-14 NOTE — ED Notes (Signed)
 Patient is being discharged from the Urgent Care and sent to the Emergency Department via pov . Per Cheri Rous NP, patient is in need of higher level of care due to RLQ pain. Patient is aware and verbalizes understanding of plan of care.  Vitals:   07/14/23 1808  BP: 124/74  Pulse: 75  Resp: 17  Temp: 98.5 F (36.9 C)  SpO2: 94%

## 2023-07-15 LAB — URINE CULTURE: Culture: NO GROWTH

## 2023-07-16 ENCOUNTER — Emergency Department (HOSPITAL_COMMUNITY)
Admission: EM | Admit: 2023-07-16 | Discharge: 2023-07-16 | Disposition: A | Discharge status: Home / Self Care | Attending: Emergency Medicine | Admitting: Emergency Medicine

## 2023-07-16 ENCOUNTER — Emergency Department (HOSPITAL_COMMUNITY)

## 2023-07-16 ENCOUNTER — Encounter (HOSPITAL_COMMUNITY): Payer: Self-pay

## 2023-07-16 ENCOUNTER — Other Ambulatory Visit: Payer: Self-pay

## 2023-07-16 DIAGNOSIS — I6503 Occlusion and stenosis of bilateral vertebral arteries: Secondary | ICD-10-CM | POA: Diagnosis not present

## 2023-07-16 DIAGNOSIS — I10 Essential (primary) hypertension: Secondary | ICD-10-CM | POA: Diagnosis not present

## 2023-07-16 DIAGNOSIS — R519 Headache, unspecified: Secondary | ICD-10-CM | POA: Diagnosis not present

## 2023-07-16 DIAGNOSIS — H53149 Visual discomfort, unspecified: Secondary | ICD-10-CM | POA: Insufficient documentation

## 2023-07-16 DIAGNOSIS — R739 Hyperglycemia, unspecified: Secondary | ICD-10-CM | POA: Insufficient documentation

## 2023-07-16 DIAGNOSIS — I6523 Occlusion and stenosis of bilateral carotid arteries: Secondary | ICD-10-CM | POA: Diagnosis not present

## 2023-07-16 DIAGNOSIS — Z79899 Other long term (current) drug therapy: Secondary | ICD-10-CM | POA: Insufficient documentation

## 2023-07-16 DIAGNOSIS — Z8673 Personal history of transient ischemic attack (TIA), and cerebral infarction without residual deficits: Secondary | ICD-10-CM | POA: Insufficient documentation

## 2023-07-16 DIAGNOSIS — R42 Dizziness and giddiness: Secondary | ICD-10-CM | POA: Diagnosis not present

## 2023-07-16 DIAGNOSIS — I6502 Occlusion and stenosis of left vertebral artery: Secondary | ICD-10-CM | POA: Diagnosis not present

## 2023-07-16 DIAGNOSIS — J45909 Unspecified asthma, uncomplicated: Secondary | ICD-10-CM | POA: Insufficient documentation

## 2023-07-16 DIAGNOSIS — J322 Chronic ethmoidal sinusitis: Secondary | ICD-10-CM | POA: Insufficient documentation

## 2023-07-16 DIAGNOSIS — Q2546 Tortuous aortic arch: Secondary | ICD-10-CM | POA: Diagnosis not present

## 2023-07-16 DIAGNOSIS — I672 Cerebral atherosclerosis: Secondary | ICD-10-CM | POA: Diagnosis not present

## 2023-07-16 DIAGNOSIS — Z7951 Long term (current) use of inhaled steroids: Secondary | ICD-10-CM | POA: Insufficient documentation

## 2023-07-16 DIAGNOSIS — Z7982 Long term (current) use of aspirin: Secondary | ICD-10-CM | POA: Insufficient documentation

## 2023-07-16 DIAGNOSIS — R110 Nausea: Secondary | ICD-10-CM | POA: Diagnosis not present

## 2023-07-16 LAB — COMPREHENSIVE METABOLIC PANEL
ALT: 37 U/L (ref 0–44)
AST: 31 U/L (ref 15–41)
Albumin: 3.9 g/dL (ref 3.5–5.0)
Alkaline Phosphatase: 61 U/L (ref 38–126)
Anion gap: 9 (ref 5–15)
BUN: 23 mg/dL (ref 8–23)
CO2: 22 mmol/L (ref 22–32)
Calcium: 8.7 mg/dL — ABNORMAL LOW (ref 8.9–10.3)
Chloride: 105 mmol/L (ref 98–111)
Creatinine, Ser: 1.36 mg/dL — ABNORMAL HIGH (ref 0.61–1.24)
GFR, Estimated: 55 mL/min — ABNORMAL LOW (ref >60)
Glucose, Bld: 110 mg/dL — ABNORMAL HIGH (ref 70–99)
Potassium: 4.5 mmol/L (ref 3.5–5.1)
Sodium: 136 mmol/L (ref 135–145)
Total Bilirubin: 1.3 mg/dL — ABNORMAL HIGH (ref 0.0–1.2)
Total Protein: 5.8 g/dL — ABNORMAL LOW (ref 6.5–8.1)

## 2023-07-16 LAB — CBC WITH DIFFERENTIAL/PLATELET
Abs Immature Granulocytes: 0.05 10*3/uL (ref 0.00–0.07)
Basophils Absolute: 0 10*3/uL (ref 0.0–0.1)
Basophils Relative: 1 %
Eosinophils Absolute: 0.4 10*3/uL (ref 0.0–0.5)
Eosinophils Relative: 6 %
HCT: 43.1 % (ref 39.0–52.0)
Hemoglobin: 14.8 g/dL (ref 13.0–17.0)
Immature Granulocytes: 1 %
Lymphocytes Relative: 29 %
Lymphs Abs: 2.1 10*3/uL (ref 0.7–4.0)
MCH: 30 pg (ref 26.0–34.0)
MCHC: 34.3 g/dL (ref 30.0–36.0)
MCV: 87.4 fL (ref 80.0–100.0)
Monocytes Absolute: 0.6 10*3/uL (ref 0.1–1.0)
Monocytes Relative: 8 %
Neutro Abs: 4.2 10*3/uL (ref 1.7–7.7)
Neutrophils Relative %: 55 %
Platelets: 126 10*3/uL — ABNORMAL LOW (ref 150–400)
RBC: 4.93 MIL/uL (ref 4.22–5.81)
RDW: 13.1 % (ref 11.5–15.5)
WBC: 7.3 10*3/uL (ref 4.0–10.5)
nRBC: 0 % (ref 0.0–0.2)

## 2023-07-16 LAB — I-STAT CHEM 8, ED
BUN: 23 mg/dL (ref 8–23)
Calcium, Ion: 1.14 mmol/L — ABNORMAL LOW (ref 1.15–1.40)
Chloride: 107 mmol/L (ref 98–111)
Creatinine, Ser: 1.5 mg/dL — ABNORMAL HIGH (ref 0.61–1.24)
Glucose, Bld: 101 mg/dL — ABNORMAL HIGH (ref 70–99)
HCT: 41 % (ref 39.0–52.0)
Hemoglobin: 13.9 g/dL (ref 13.0–17.0)
Potassium: 4.4 mmol/L (ref 3.5–5.1)
Sodium: 139 mmol/L (ref 135–145)
TCO2: 25 mmol/L (ref 22–32)

## 2023-07-16 LAB — RESP PANEL BY RT-PCR (RSV, FLU A&B, COVID)  RVPGX2
Influenza A by PCR: NEGATIVE
Influenza B by PCR: NEGATIVE
Resp Syncytial Virus by PCR: NEGATIVE
SARS Coronavirus 2 by RT PCR: NEGATIVE

## 2023-07-16 MED ORDER — IOHEXOL 350 MG/ML SOLN
75.0000 mL | Freq: Once | INTRAVENOUS | Status: AC | PRN
  Administered 2023-07-16: 75 mL via INTRAVENOUS

## 2023-07-16 MED ORDER — PROCHLORPERAZINE EDISYLATE 10 MG/2ML IJ SOLN
10.0000 mg | Freq: Once | INTRAMUSCULAR | Status: AC
  Administered 2023-07-16: 10 mg via INTRAVENOUS
  Filled 2023-07-16: qty 2

## 2023-07-16 NOTE — ED Triage Notes (Addendum)
 Pt woke with headache after going to bed with dizziness. Headache starts at base of skull & radiates upward. Light sensitivity & nausea. Pt on daily ASA, hx of TIA. Pt has been on antibiotic x 3 weeks for URI.   EMS placed 18 in LAC PTA.  98% RA Cbg 115

## 2023-07-16 NOTE — ED Provider Notes (Signed)
 [place] [facility] AT [facility] Provider Note  CSN: [id number] Arrival date & time: 07/16/23 4098  Chief Complaint(s) Headache  HPI Arthur Moore is a 73 y.o. male with a past medical history listed below who presents to the emergency department with sudden onset left-sided neck and headache.  Described as stabbing pain.  Endorses nausea without emesis.  Reports photophobia.  Also endorses several days of intermittent dizziness.  No focal deficits.  No numbness or tingling.  No visual disturbance.  Patient reports taking Tylenol 45 minutes prior to calling EMS which did not help however currently his pain is mild since arriving.    Reports recent bronchitis 3 weeks ago.  No other recent infections.  No chest pain or shortness of breath.  No abdominal pain.  The history is provided by the patient.    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   CAD (coronary artery disease) 07/09/2018   CKD (chronic kidney disease), 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   Chest pain of uncertain etiology 07/08/2018   Chronic cough 06/21/2018   Thrombocytopenia (HCC) 12/01/2016   Transaminitis 12/01/2016   Diastolic dysfunction 06/20/2016   MDD (major depressive disorder), recurrent severe, 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 Authorizing Provider  albuterol (VENTOLIN HFA) 108 (90 Base) MCG/ACT inhaler Inhale 2 puffs into the lungs every 4 (four) hours as needed for wheezing or shortness of breath. 07/07/23   Kozlow, Alvira Philips, MD  amoxicillin-clavulanate (AUGMENTIN) 875-125 MG tablet Take 1 tablet by mouth 2 (two) times daily for 10 days. 07/06/23 07/16/23  Bing Neighbors, NP  aspirin EC 81 MG tablet Take 81 mg by mouth daily.    [provider]  budesonide (PULMICORT) 0.25 MG/2ML nebulizer solution Take 2 mLs (0.25 mg total) by nebulization every 4 (four) hours as needed (During flare ups). 07/07/23   Kozlow, Alvira Philips, MD  budesonide-formoterol (SYMBICORT) 160-4.5 MCG/ACT inhaler Inhale 2 puffs into the lungs 2 (two) times daily. With spacer (empty lungs) 07/07/23   Kozlow, Alvira Philips, MD  cetirizine (ZYRTEC ALLERGY) 10 MG tablet Take 1 tablet (10 mg total) by mouth daily as needed (can take an extra dose during flare ups). 07/07/23   Kozlow, Alvira Philips, MD  fluticasone (FLONASE) 50 MCG/ACT nasal spray Place 2 sprays into both nostrils daily. 1-2 sprays each nostril 1 (ONE) time per day. 03/31/23   Kozlow, Alvira Philips, MD  lamoTRIgine (LAMICTAL) 200 MG tablet Take 1 tablet (200 mg total) by  mouth 2 (two) times daily. 02/05/22   Mozingo, Thereasa Solo, NP  metoprolol tartrate (LOPRESSOR) 25 MG tablet TAKE 1/2 TABLET BY MOUTH TWICE A DAY 07/21/22   Chilton Si, MD  nitroGLYCERIN (NITROSTAT) 0.4 MG SL tablet Place 1 tablet (0.4 mg total) under the tongue every 5 (five) minutes as needed for chest pain. 07/11/22 03/31/23  Alver Sorrow, NP  pantoprazole (PROTONIX) 40 MG tablet Take 1 tablet (40 mg total) by mouth 2 (two) times daily. 07/07/23    Kozlow, Alvira Philips, MD  Respiratory Therapy Supplies (NEBULIZER MASK ADULT) MISC 1 Device by Does not apply route as directed. 07/07/23   Kozlow, Alvira Philips, MD  rosuvastatin (CRESTOR) 10 MG tablet Take 1 tablet by mouth daily.    [provider]  sertraline (ZOLOFT) 50 MG tablet Take 100 mg by mouth every morning. 05/26/23   [provider]  Spacer/Aero-Holding Deretha Emory DEVI 1 Device by Does not apply route as directed. 07/07/23   Kozlow, Alvira Philips, MD  UNABLE TO FIND Inject 0.5 mLs into the skin once a week.    [provider]                                                                                                                                    Allergies Patient has no known allergies.  Review of Systems Review of Systems As noted in HPI  Physical Exam Vital Signs  I have reviewed the triage vital signs BP (!) 145/79   Pulse 66   Temp 98.3 F (36.8 C) (Oral)   Resp 19   Ht 5' 10.4" (1.788 m)   Wt 83 kg   SpO2 95%   BMI 25.96 kg/m   Physical Exam Vitals reviewed.  Constitutional:      General: He is not in acute distress.    Appearance: He is well-developed. He is not diaphoretic.  HENT:     Head: Normocephalic and atraumatic.     Right Ear: External ear normal.     Left Ear: External ear normal.     Nose: Nose normal.     Mouth/Throat:     Mouth: Mucous membranes are moist.  Eyes:     General: No scleral icterus.    Conjunctiva/sclera: Conjunctivae normal.  Neck:     Trachea: Phonation normal.   Cardiovascular:     Rate and Rhythm: Normal rate and regular rhythm.  Pulmonary:     Effort: Pulmonary effort is normal. No respiratory distress.     Breath sounds: No stridor.  Abdominal:     General: There is no distension.  Musculoskeletal:        General: Normal range of motion.     Cervical back: Normal range of motion. Muscular tenderness present. No spinous process tenderness.  Neurological:     Mental Status: He is alert and oriented  to person, place, and time.     Comments:  Mental Status:  Alert and oriented to person, place, and time.  Attention and concentration normal.  Speech clear.  Recent memory is intact  Cranial Nerves:  II Visual Fields: Intact to confrontation. Visual fields intact. III, IV, VI: Pupils equal and reactive to light and near. Full eye movement without nystagmus  V Facial Sensation: Normal. No weakness of masticatory muscles  VII: No facial weakness or asymmetry  VIII Auditory Acuity: Grossly normal  IX/X: The uvula is midline; the palate elevates symmetrically  XI: Normal sternocleidomastoid and trapezius strength  XII: The tongue is midline. No atrophy or fasciculations.   Motor System: Muscle Strength: 5/5 and symmetric in the upper and lower extremities. No pronation or drift.  Muscle Tone: Tone and muscle bulk are normal in the upper and lower extremities.  Coordination: Intact finger-to-nose. No tremor.  Sensation: Intact to light touch Gait: deferred   Psychiatric:        Behavior: Behavior normal.     ED Results and Treatments Labs (all labs ordered are listed, but only abnormal results are displayed) Labs Reviewed  CBC WITH DIFFERENTIAL/PLATELET - Abnormal; Notable for the following components:      Result Value   Platelets 126 (*)    All other components within normal limits  COMPREHENSIVE METABOLIC PANEL - Abnormal; Notable for the following components:   Glucose, Bld 110 (*)    Creatinine, Ser 1.36 (*)    Calcium 8.7 (*)    Total Protein 5.8 (*)    Total Bilirubin 1.3 (*)    GFR, Estimated 55 (*)    All other components within normal limits  I-STAT CHEM 8, ED - Abnormal; Notable for the following components:   Creatinine, Ser 1.50 (*)    Glucose, Bld 101 (*)    Calcium, Ion 1.14 (*)    All other components within normal limits  RESP PANEL BY RT-PCR (RSV, FLU A&B, COVID)  [id number]                                                                                                                          EKG  EKG Interpretation Date/Time:    Ventricular Rate:    PR Interval:    QRS Duration:    QT Interval:    QTC Calculation:   R Axis:      Text Interpretation:         Radiology CT ANGIO HEAD NECK W WO CM Result Date: 07/16/2023 CLINICAL DATA:  73 year old male with headache and dizziness. Light sensitivity and nausea. Recent URI. EXAM: CT ANGIOGRAPHY HEAD AND NECK WITH AND WITHOUT CONTRAST TECHNIQUE: Multidetector CT imaging of the head and neck was performed using the standard protocol during bolus administration of intravenous contrast. Multiplanar CT image reconstructions and MIPs were obtained to evaluate the vascular anatomy. Carotid stenosis measurements (when applicable) are obtained utilizing NASCET criteria, using the distal internal carotid diameter as the denominator. RADIATION DOSE REDUCTION: This exam was performed according to the departmental  dose-optimization program which includes automated exposure control, adjustment of the mA and/or kV according to patient size and/or use of iterative reconstruction technique. CONTRAST:  75mL OMNIPAQUE IOHEXOL 350 MG/ML SOLN COMPARISON:  Brain MRI 11/15/2022.  Head CT 03/14/2022. FINDINGS: CT HEAD Brain: Calvarium and skull base: Paranasal sinuses: Maxillary sinus mucosal thickening and layering fluid. Mild scattered ethmoid sinus similar opacity. Frontal and sphenoid sinuses spared. Tympanic cavities and mastoids well aerated. Orbits: Visualized orbits and scalp soft tissues are within normal limits. Intact. No acute osseous abnormality identified. Stable cerebral volume. No midline shift, ventriculomegaly, mass effect, evidence of mass lesion, intracranial hemorrhage or evidence of cortically based acute infarction. Patchy mild to moderate for age mostly periventricular white matter hypodensity is stable since 2023. CTA NECK Skeleton: Cervical spine degeneration superimposed on previous laminectomy at  C6-C7. No acute osseous abnormality identified. Upper chest: Negative. Other neck: Neck soft tissue spaces are within normal limits. Aortic arch: Partially visible tortuous aortic arch. Soft and calcified arch atherosclerosis. Three vessel arch configuration. Right carotid system: Brachiocephalic artery and right CCA or are patent with no significant plaque or stenosis. Minimal soft and calcified plaque at the right ICA origin. Tortuous right ICA distal to the bulb. No stenosis. Left carotid system: Mild left CCA and left ICA origin plaque. Tortuous left ICA below the skull base with no stenosis. Vertebral arteries: Right subclavian artery origin calcified plaque with no significant stenosis. Normal right vertebral artery origin. Right vertebral artery is patent and tortuous, non dominant to the skull base. Proximal left subclavian artery soft and calcified plaque without stenosis. Calcified plaque at the left vertebral artery origin with mild to moderate stenosis on series 12, image 168. Dominant left vertebral is patent and mildly tortuous to the skull base with no additional cervical plaque or stenosis. CTA HEAD Posterior circulation: Dominant left vertebral V4 segment is patent although with bulky calcified plaque. Only mild left V4 stenosis results. Normal non dominant right V4 segment. Patent and normal vertebrobasilar junction. Bilateral AICA appear dominant and patent. Tortuous basilar artery is patent without stenosis. Normal SCA and left PCA origins. Fetal type right PCA origin with P1 segment present. Small left posterior communicating artery also. Tortuous right PCA. Bilateral PCA branches are within normal limits. Anterior circulation: Both ICA siphons are patent with minimal supraclinoid calcified plaque and no stenosis. Ophthalmic and posterior communicating artery origins are normal. Patent carotid termini. Normal MCA and ACA origins. Normal anterior communicating artery, bilateral ACA branches. Left  MCA M1 segment and trifurcation are patent without stenosis. Right MCA M1 segment and bifurcation are patent without stenosis. Mildly tortuous bilateral MCA branches are otherwise normal. Venous sinuses: Early contrast timing, grossly patent. Anatomic variants: Dominant left vertebral artery. Fetal type right PCA origin. Review of the MIP images confirms the above findings IMPRESSION: 1. No acute intracranial abnormality, stable non contrast CT appearance of the brain. 2. CTA reveals generalized arterial tortuosity in the head and neck. But no large vessel occlusion and generally mild for age atherosclerosis. Atherosclerotic plaque is most conspicuous in the Dominant Left Vertebral Artery with mild to moderate stenosis at its origin, mild left V4 segment stenosis. 3. Mild to moderate maxillary and ethmoid sinus inflammation. Electronically Signed   By: Odessa Fleming M.D.   On: 07/16/2023 05:41    Medications Ordered in ED Medications  prochlorperazine (COMPAZINE) injection 10 mg (10 mg Intravenous Given 07/16/23 0506)  iohexol (OMNIPAQUE) 350 MG/ML injection 75 mL (75 mLs Intravenous Contrast Given 07/16/23 0530)  Procedures Procedures  (including critical care time) Medical Decision Making / ED Course   Medical Decision Making Amount and/or Complexity of Data Reviewed Labs: ordered. Decision-making details documented in ED Course. Radiology: ordered and independent interpretation performed. Decision-making details documented in ED Course.  Risk Prescription drug management.    Headache differential diagnosis considered and workup below.  Clinically not consistent with meningitis.  Workup negative for acute arterial dissection, or ICH. Doubt subclavian steal syndrome.  Doubt CVA.    CBC without leukocytosis or anemia.  CMP without significant electrolyte derangements or renal sufficiency.  Mild hyperglycemia without DKA.  Mild renal sufficiency without AKI.  Viral panel ordered to rule out COVID,  influenza, RSV which was negative.  Ambulated well.    Final Clinical Impression(s) / ED Diagnoses Final diagnoses:  Bad headache   The patient appears reasonably screened and/or stabilized for discharge and I doubt any other medical condition or other [facility] requiring further screening, evaluation, or treatment in the ED at this time. I have discussed the findings, Dx and Tx plan with the patient/family who expressed understanding and agree(s) with the plan. Discharge instructions discussed at length. The patient/family was given strict return precautions who verbalized understanding of the instructions. No further questions at time of discharge.  Disposition: Discharge  Condition: Good  ED Discharge Orders     None        Follow Up: Jackelyn Poling, DO [address] [place] [address] [phone]  Call  to schedule an appointment for close follow up    This chart was dictated using voice recognition software.  Despite best efforts to proofread,  errors can occur which can change the documentation meaning.    Nira Conn, MD 07/16/23 [phone]

## 2023-07-16 NOTE — ED Notes (Signed)
 Patient discharged by this RN. Ambulatory to lobby without assistance.

## 2023-07-16 NOTE — ED Notes (Signed)
 Patient transported to CT

## 2023-07-17 ENCOUNTER — Ambulatory Visit (INDEPENDENT_AMBULATORY_CARE_PROVIDER_SITE_OTHER): Payer: Self-pay

## 2023-07-17 DIAGNOSIS — J309 Allergic rhinitis, unspecified: Secondary | ICD-10-CM

## 2023-07-20 DIAGNOSIS — D210 Benign neoplasm of connective and other soft tissue of head, face and neck: Secondary | ICD-10-CM | POA: Diagnosis not present

## 2023-07-22 DIAGNOSIS — R0683 Snoring: Secondary | ICD-10-CM | POA: Diagnosis not present

## 2023-07-23 ENCOUNTER — Encounter (HOSPITAL_BASED_OUTPATIENT_CLINIC_OR_DEPARTMENT_OTHER): Payer: Self-pay | Admitting: Cardiovascular Disease

## 2023-07-23 ENCOUNTER — Ambulatory Visit
Admission: RE | Admit: 2023-07-23 | Discharge: 2023-07-23 | Disposition: A | Discharge status: Home / Self Care | Source: Ambulatory Visit | Attending: Family Medicine | Admitting: Family Medicine

## 2023-07-23 VITALS — BP 122/67 | HR 66 | Temp 98.0°F | Resp 16 | Ht 71.5 in | Wt 179.0 lb

## 2023-07-23 DIAGNOSIS — J019 Acute sinusitis, unspecified: Secondary | ICD-10-CM | POA: Diagnosis not present

## 2023-07-23 MED ORDER — AMOXICILLIN-POT CLAVULANATE 875-125 MG PO TABS: 1.0000 | ORAL_TABLET | Freq: Two times a day (BID) | ORAL | 0 refills | Status: AC

## 2023-07-23 NOTE — ED Provider Notes (Signed)
 [name]    CSN: [id number] Arrival date & time: 07/23/23  1303      History   Chief Complaint Chief Complaint  Patient presents with   Headache    May have a sinus infection - Entered by patient    HPI Arthur Moore is a 73 y.o. male.    Headache Had upper respiratory symptoms for 2 weeks now having increased sinus pain and pressure, sinus headache, puffiness on his face and pain to his upper teeth.  Admits cough and increased need for use of inhaler.  Denies chest pain, shortness of breath, wheezing,  fever, chills, sweats, nausea, vomiting, ear pain, sore throat.  Wife with similar symptoms.  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   Chest pain of uncertain etiology 07/08/2018   Chronic cough 06/21/2018   Thrombocytopenia (HCC) 12/01/2016   Transaminitis 12/01/2016   Diastolic dysfunction 06/20/2016   MDD (major depressive disorder), recurrent severe, 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   Hyperlipidemia 08/24/2015   Arthritis    Depression    Hypertension    Aortic insufficiency    Psoriasis    Cervical disc disease    Lumbar disc disease    Asthma 01/06/2015   Psoriatic arthritis (HCC) 01/06/2015   Allergic rhinoconjunctivitis 01/06/2015   GERD (gastroesophageal reflux disease) 01/06/2015   Laryngopharyngeal reflux (LPR) 01/06/2015    Past Surgical History:  Procedure  Laterality Date   CHOLECYSTECTOMY  2012   HERNIA REPAIR     KNEE ARTHROSCOPY W/ MENISCAL REPAIR  11/2021   LEFT HEART CATH AND CORONARY ANGIOGRAPHY N/A 07/08/2018   Procedure: LEFT HEART CATH AND CORONARY ANGIOGRAPHY;  Surgeon: Kathleene Hazel, MD;  Location: [facility] INVASIVE [facility];  Service: Cardiovascular;  Laterality: N/A;   PAROTIDECTOMY Right 1992   PROSTATE SURGERY  2002, 2004   SINOSCOPY     SINUS EXPLORATION  2013   SPINE SURGERY  2013   L C7-T1 laminectomy and discectomy   TONSILLECTOMY         Home Medications    Prior to Admission medications   Medication Sig Start Date End Date Taking? Authorizing Provider  amoxicillin-clavulanate (AUGMENTIN) 875-125 MG tablet Take 1 tablet by mouth every 12 (twelve) hours for 7 days. 07/23/23 07/30/23 Yes Meliton Rattan, PA  albuterol (VENTOLIN HFA) 108 (90 Base) MCG/ACT inhaler Inhale 2 puffs into the lungs every 4 (four) hours as needed for wheezing or shortness of breath. 07/07/23   Kozlow, Alvira Philips, MD  aspirin EC 81 MG tablet Take 81 mg by mouth daily.    [provider]  budesonide (PULMICORT) 0.25 MG/2ML nebulizer solution Take 2 mLs (0.25 mg total) by nebulization every 4 (four) hours as needed (During flare ups). 07/07/23   Kozlow, Alvira Philips, MD  budesonide-formoterol (SYMBICORT) 160-4.5 MCG/ACT inhaler Inhale 2 puffs into the lungs 2 (two) times daily.  With spacer (empty lungs) 07/07/23   Kozlow, Alvira Philips, MD  cetirizine (ZYRTEC ALLERGY) 10 MG tablet Take 1 tablet (10 mg total) by mouth daily as needed (can take an extra dose during flare ups). 07/07/23   Kozlow, Alvira Philips, MD  fluticasone (FLONASE) 50 MCG/ACT nasal spray Place 2 sprays into both nostrils daily. 1-2 sprays each nostril 1 (ONE) time per day. 03/31/23   Kozlow, Alvira Philips, MD  lamoTRIgine (LAMICTAL) 200 MG tablet Take 1 tablet (200 mg total) by mouth 2 (two) times daily. 02/05/22   Mozingo, Thereasa Solo, NP  metoprolol tartrate (LOPRESSOR) 25 MG tablet TAKE 1/2 TABLET BY  MOUTH TWICE A DAY 07/21/22   Chilton Si, MD  nitroGLYCERIN (NITROSTAT) 0.4 MG SL tablet Place 1 tablet (0.4 mg total) under the tongue every 5 (five) minutes as needed for chest pain. 07/11/22 03/31/23  Alver Sorrow, NP  pantoprazole (PROTONIX) 40 MG tablet Take 1 tablet (40 mg total) by mouth 2 (two) times daily. 07/07/23   Kozlow, Alvira Philips, MD  Respiratory Therapy Supplies (NEBULIZER MASK ADULT) MISC 1 Device by Does not apply route as directed. 07/07/23   Kozlow, Alvira Philips, MD  rosuvastatin (CRESTOR) 10 MG tablet Take 1 tablet by mouth daily.    [provider]  sertraline (ZOLOFT) 50 MG tablet Take 100 mg by mouth every morning. 05/26/23   [provider]  Spacer/Aero-Holding Deretha Emory DEVI 1 Device by Does not apply route as directed. 07/07/23   Kozlow, Alvira Philips, MD  UNABLE TO FIND Inject 0.5 mLs into the skin once a week.    [provider]    Family History Family History  Problem Relation Age of Onset   Colon cancer Mother    Lung cancer Father    Cancer Other    Bipolar disorder Brother    Allergic rhinitis Neg Hx    Angioedema Neg Hx    Asthma Neg Hx    Eczema Neg Hx     Social History Social History   Tobacco Use   Smoking status: Never   Smokeless tobacco: Never  Vaping Use   Vaping status: Never Used  Substance Use Topics   Alcohol use: Not Currently   Drug use: No     Allergies   Patient has no known allergies.   Review of Systems Review of Systems  Neurological:  Positive for headaches.     Physical Exam Triage Vital Signs ED Triage Vitals  Encounter Vitals Group     BP 07/23/23 1324 122/67     Systolic BP Percentile --      Diastolic BP Percentile --      Pulse Rate 07/23/23 1324 66     Resp 07/23/23 1324 16     Temp 07/23/23 1324 98 F (36.7 C)     Temp Source 07/23/23 1324 Oral     SpO2 07/23/23 1324 96 %     Weight 07/23/23 1324 179 lb (81.2 kg)     Height 07/23/23 1324 5' 11.5" (1.816 m)     Head Circumference  --      Peak Flow --      Pain Score 07/23/23 1330 6     Pain Loc --      Pain Education --      Exclude from Growth Chart --    No data found.  Updated Vital Signs BP 122/67 (BP Location: Right Arm)   Pulse 66   Temp 98 F (36.7 C) (Oral)  Resp 16   Ht 5' 11.5" (1.816 m)   Wt 179 lb (81.2 kg)   SpO2 96%   BMI 24.62 kg/m   Visual Acuity Right Eye Distance:   Left Eye Distance:   Bilateral Distance:    Right Eye Near:   Left Eye Near:    Bilateral Near:     Physical Exam Vitals and nursing note reviewed.  Constitutional:      Appearance: He is not ill-appearing.  HENT:     Head: Normocephalic and atraumatic.     Right Ear: Tympanic membrane and ear canal normal.     Left Ear: Tympanic membrane and ear canal normal.     Nose: Congestion present. No rhinorrhea.     Right Sinus: Maxillary sinus tenderness present. No frontal sinus tenderness.     Left Sinus: Maxillary sinus tenderness present. No frontal sinus tenderness.     Mouth/Throat:     Mouth: Mucous membranes are moist.     Pharynx: Oropharynx is clear. No oropharyngeal exudate or posterior oropharyngeal erythema.  Cardiovascular:     Rate and Rhythm: Normal rate.  Pulmonary:     Effort: Pulmonary effort is normal. No respiratory distress.  Musculoskeletal:     Cervical back: Neck supple.  Lymphadenopathy:     Cervical: No cervical adenopathy.  Skin:    General: Skin is warm and dry.  Neurological:     Mental Status: He is alert.  Psychiatric:        Mood and Affect: Mood normal.      UC Treatments / Results  Labs (all labs ordered are listed, but only abnormal results are displayed) Labs Reviewed - No data to display  EKG   Radiology No results found.  Procedures Procedures (including critical care time)  Medications Ordered in UC Medications - No data to display  Initial Impression / Assessment and Plan / UC Course  I have reviewed the triage vital signs and the nursing  notes.  Pertinent labs & imaging results that were available during my care of the patient were reviewed by me and considered in my medical decision making (see chart for details).     73 year old male with URI symptoms for 2 weeks now having increased sinus pain and pressure concerned he may have a sinus infection.  He has sinus congestion and tenderness on exam will treat with antibiotics, recommend Flonase, Nettie pot or nasal saline rinses.  Follow-up with PCP as needed Final Clinical Impressions(s) / UC Diagnoses   Final diagnoses:  Acute sinusitis, recurrence not specified, unspecified location   Discharge Instructions   None    ED Prescriptions     Medication Sig Dispense Auth. Provider   amoxicillin-clavulanate (AUGMENTIN) 875-125 MG tablet Take 1 tablet by mouth every 12 (twelve) hours for 7 days. 14 tablet Meliton Rattan, [place]      PDMP not reviewed this encounter.   Meliton Rattan, [place] 07/23/23 1352

## 2023-07-23 NOTE — Telephone Encounter (Signed)
 Please review and advise.

## 2023-07-23 NOTE — ED Triage Notes (Signed)
 Pt states sinus headache for the past few days.

## 2023-07-28 ENCOUNTER — Encounter: Payer: Medicare Other | Admitting: Psychology

## 2023-08-06 DIAGNOSIS — M79641 Pain in right hand: Secondary | ICD-10-CM | POA: Diagnosis not present

## 2023-08-06 DIAGNOSIS — M7711 Lateral epicondylitis, right elbow: Secondary | ICD-10-CM | POA: Diagnosis not present

## 2023-08-06 DIAGNOSIS — M79642 Pain in left hand: Secondary | ICD-10-CM | POA: Diagnosis not present

## 2023-08-07 DIAGNOSIS — I25118 Atherosclerotic heart disease of native coronary artery with other forms of angina pectoris: Secondary | ICD-10-CM | POA: Diagnosis not present

## 2023-08-07 DIAGNOSIS — I700 Atherosclerosis of aorta: Secondary | ICD-10-CM | POA: Diagnosis not present

## 2023-08-07 DIAGNOSIS — I10 Essential (primary) hypertension: Secondary | ICD-10-CM | POA: Diagnosis not present

## 2023-08-09 ENCOUNTER — Encounter (HOSPITAL_BASED_OUTPATIENT_CLINIC_OR_DEPARTMENT_OTHER): Payer: Self-pay | Admitting: Cardiovascular Disease

## 2023-08-09 NOTE — Patient Instructions (Incomplete)
  1. Continue Symbicort 160 - 2 puffs twice a day with spacer (empty lungs) Information given on Fasenra, Nucala, and Dupixent. After reviewing please call our office and let us  know which biologic you would like to start  2. Continue Flonase 1-2 sprays each nostril one time per day   3. Continue Protonix 40 mg 1-2 times per day  4. Continue the following if needed:   A. Cetirizine 10 mg - 1 tablet 1-2 time per day    B. Albuterol HFA - 2 inhalations every 6 hours  5. During flare ups   C. Use budesonide 0.25 mg nebulized 4 times per day for 1-2 weeks   6. Influenza = Tamiflu. Covid = Paxlovid  7. We will refer you to Dr. Lydia Sams (ENT) due to throat sensation Return to clinic in 4-6 weeks with Dr. Kozlow or earlier if problem

## 2023-08-11 ENCOUNTER — Other Ambulatory Visit: Payer: Self-pay

## 2023-08-11 ENCOUNTER — Encounter: Payer: Self-pay | Admitting: Family

## 2023-08-11 ENCOUNTER — Ambulatory Visit: Payer: Self-pay

## 2023-08-11 ENCOUNTER — Ambulatory Visit: Admitting: Family

## 2023-08-11 ENCOUNTER — Telehealth: Payer: Self-pay | Admitting: Family

## 2023-08-11 VITALS — BP 118/60 | HR 72 | Temp 98.2°F | Resp 16

## 2023-08-11 DIAGNOSIS — J4540 Moderate persistent asthma, uncomplicated: Secondary | ICD-10-CM | POA: Diagnosis not present

## 2023-08-11 DIAGNOSIS — K219 Gastro-esophageal reflux disease without esophagitis: Secondary | ICD-10-CM | POA: Diagnosis not present

## 2023-08-11 DIAGNOSIS — J309 Allergic rhinitis, unspecified: Secondary | ICD-10-CM

## 2023-08-11 DIAGNOSIS — J3089 Other allergic rhinitis: Secondary | ICD-10-CM | POA: Diagnosis not present

## 2023-08-11 DIAGNOSIS — L299 Pruritus, unspecified: Secondary | ICD-10-CM | POA: Diagnosis not present

## 2023-08-11 NOTE — Telephone Encounter (Signed)
 Lamorris has been internally referred to [facility]NT with Dr. Lydia Sams.  They will reach out to the patient to schedule.  I will follow up on 4/22 to see if any progress has been made.

## 2023-08-11 NOTE — Progress Notes (Addendum)
 [address] [place] [address] Dept: [phone]  FOLLOW UP NOTE  Patient ID: Arthur Moore, male    DOB: Jan 19, 1951  Age: 73 y.o. MRN: [id number] Date of Office Visit: 08/11/2023  Assessment  Chief Complaint: Follow-up (Allergies /Asthma /Itching watery eyes), Nasal Congestion, Cough, Wheezing, and Breathing Problem  HPI Arthur Moore is a 73 year old male who presents today for an acute visit of breathing problems.  He was last seen on July 07, 2023 by Dr. Lucie Leather for well-controlled moderate persistent asthma, allergic rhinitis, laryngopharyngeal reflux disease, pruritic disorder, and viral respiratory illness.  His wife is here with him today and helps provide history.  He denies any new diagnosis or surgery since his last office visit.  Moderate persistent asthma: He continues to take Symbicort 160/4.5 mcg 2 puffs twice a day with spacer, albuterol 1-2 times a day, and he has not used budesonide with his nebulizer in a couple weeks.  He reports a dry cough that occurs daily, wheezing at times, tightness in his chest that can also occur in his throat ,and shortness of breath if he is out and about.  Sometimes he will have nocturnal awakenings due to breathing problems.  He denies chest pain, palpitations, or lower extremity swelling.  He reports that since this January his breathing has been terrible.  When he uses his albuterol it does help.  He mentions that he has not exercised in a couple months due to tendinitis.  He loves to play pickle ball.  He is hesitant to start a biologic drug.  He reports that he has an enlarged prostate.  He and his wife are going to be traveling in a couple weeks to see her sister who has a cat.  He is allergic to cats and he wonders what he can do to help with his asthma.  His absolute eosinophil count on July 16, 2023 was 400.  Allergic rhinitis: He continues to receive allergy injections per protocol and mentions that he has been on allergy  shots for 6 to 7 years.  He is currently receiving allergy injections directed towards cat, dust mite, weed, grass, and tree pollen.  He is using fluticasone nasal spray as needed and aching Zyrtec 1 tablet once a day.  He reports nasal congestion, postnasal drip, and a sensation in his throat that he describes as sore, but not really sore. He was treated for acute sinusitis on 07/23/23 and was given Augmentin. He has concerns and would like a referral to see Dr. Allena Katz ENT due to his dad having throat cancer.  His dad both smoke and drank.  He reports that he has never smoked.  He used to drink when he was in the [employer].  Laryngopharyngeal reflux disease: He continues to take Protonix 40 mg once a day.  He reports his reflux has been pretty good.  Pruritic disorder: He reports that he thinks that he has found the culprit for the itching he was having previously.  He was previously using a men's fragrance spray and since stopping it he has not had an issue.   Drug Allergies:  No Known Allergies  Review of Systems: Negative except as per HPI   Physical Exam: BP 118/60   Pulse 72   Temp 98.2 F (36.8 C)   Resp 16   SpO2 94%    Physical Exam Exam conducted with a chaperone present (wife present).  Constitutional:      Appearance: Normal appearance.  HENT:  Head: Normocephalic and atraumatic.     Comments: Pharynx normal. Eyes normal. Ears normal. Nose normal    Right Ear: Tympanic membrane, ear canal and external ear normal.     Left Ear: Tympanic membrane, ear canal and external ear normal.     Nose: Nose normal.     Mouth/Throat:     Mouth: Mucous membranes are moist.     Pharynx: Oropharynx is clear.  Eyes:     Conjunctiva/sclera: Conjunctivae normal.  Cardiovascular:     Rate and Rhythm: Regular rhythm.     Heart sounds: Normal heart sounds.  Pulmonary:     Effort: Pulmonary effort is normal.     Breath sounds: Normal breath sounds.  Musculoskeletal:     Cervical  back: Neck supple.  Skin:    General: Skin is warm.  Neurological:     Mental Status: He is alert and oriented to person, place, and time.  Psychiatric:        Mood and Affect: Mood normal.        Behavior: Behavior normal.        Thought Content: Thought content normal.        Judgment: Judgment normal.     Diagnostics: FVC 3.55 L (85%), FEV1 2.91 L (92%), FEV1/FVC 0.82.  Spirometry indicates normal spirometry.  Spirometry is improved from previous spirometry.   EXAM:07/16/23 CT ANGIOGRAPHY HEAD AND NECK WITH AND WITHOUT CONTRAST   TECHNIQUE: Multidetector CT imaging of the head and neck was performed using the standard protocol during bolus administration of intravenous contrast. Multiplanar CT image reconstructions and MIPs were obtained to evaluate the vascular anatomy. Carotid stenosis measurements (when applicable) are obtained utilizing NASCET criteria, using the distal internal carotid diameter as the denominator.   RADIATION DOSE REDUCTION: This exam was performed according to the departmental dose-optimization program which includes automated exposure control, adjustment of the mA and/or kV according to patient size and/or use of iterative reconstruction technique.   CONTRAST:  75mL OMNIPAQUE IOHEXOL 350 MG/ML SOLN   COMPARISON:  Brain MRI 11/15/2022.  Head CT 03/14/2022.   FINDINGS: CT HEAD   Brain:   Calvarium and skull base:   Paranasal sinuses: Maxillary sinus mucosal thickening and layering fluid. Mild scattered ethmoid sinus similar opacity. Frontal and sphenoid sinuses spared. Tympanic cavities and mastoids well aerated.   Orbits: Visualized orbits and scalp soft tissues are within normal limits. Intact. No acute osseous abnormality identified. Stable cerebral volume. No midline shift, ventriculomegaly, mass effect, evidence of mass lesion, intracranial hemorrhage or evidence of cortically based acute infarction. Patchy mild to moderate for  age mostly periventricular white matter hypodensity is stable since 2023.   CTA NECK   Skeleton: Cervical spine degeneration superimposed on previous laminectomy at C6-C7. No acute osseous abnormality identified.   Upper chest: Negative.   Other neck: Neck soft tissue spaces are within normal limits.   Aortic arch: Partially visible tortuous aortic arch. Soft and calcified arch atherosclerosis. Three vessel arch configuration.   Right carotid system: Brachiocephalic artery and right CCA or are patent with no significant plaque or stenosis. Minimal soft and calcified plaque at the right ICA origin. Tortuous right ICA distal to the bulb. No stenosis.   Left carotid system: Mild left CCA and left ICA origin plaque. Tortuous left ICA below the skull base with no stenosis.   Vertebral arteries:   Right subclavian artery origin calcified plaque with no significant stenosis. Normal right vertebral artery origin. Right vertebral artery is patent and  tortuous, non dominant to the skull base.   Proximal left subclavian artery soft and calcified plaque without stenosis. Calcified plaque at the left vertebral artery origin with mild to moderate stenosis on series 12, image 168. Dominant left vertebral is patent and mildly tortuous to the skull base with no additional cervical plaque or stenosis.   CTA HEAD   Posterior circulation: Dominant left vertebral V4 segment is patent although with bulky calcified plaque. Only mild left V4 stenosis results. Normal non dominant right V4 segment. Patent and normal vertebrobasilar junction. Bilateral AICA appear dominant and patent. Tortuous basilar artery is patent without stenosis. Normal SCA and left PCA origins. Fetal type right PCA origin with P1 segment present. Small left posterior communicating artery also. Tortuous right PCA. Bilateral PCA branches are within normal limits.   Anterior circulation: Both ICA siphons are patent with  minimal supraclinoid calcified plaque and no stenosis. Ophthalmic and posterior communicating artery origins are normal. Patent carotid termini. Normal MCA and ACA origins. Normal anterior communicating artery, bilateral ACA branches. Left MCA M1 segment and trifurcation are patent without stenosis. Right MCA M1 segment and bifurcation are patent without stenosis. Mildly tortuous bilateral MCA branches are otherwise normal.   Venous sinuses: Early contrast timing, grossly patent.   Anatomic variants: Dominant left vertebral artery. Fetal type right PCA origin.   Review of the MIP images confirms the above findings   IMPRESSION: 1. No acute intracranial abnormality, stable non contrast CT appearance of the brain.   2. CTA reveals generalized arterial tortuosity in the head and neck. But no large vessel occlusion and generally mild for age atherosclerosis. Atherosclerotic plaque is most conspicuous in the Dominant Left Vertebral Artery with mild to moderate stenosis at its origin, mild left V4 segment stenosis.   3. Mild to moderate maxillary and ethmoid sinus inflammation.   He also had an echocardiogram on 07/13/23 showing:  Assessment and Plan: 1. Other allergic rhinitis   2. Not well controlled moderate persistent asthma   3. LPRD (laryngopharyngeal reflux disease)   4. Pruritic disorder     No orders of the defined types were placed in this encounter.   Patient Instructions   1. Continue Symbicort 160 - 2 puffs twice a day with spacer (empty lungs) Information given on Fasenra, Nucala, and Dupixent. After reviewing please call our office and let us know which biologic you would like to start  2. Continue Flonase 1-2 sprays each nostril one time per day   3. Continue Protonix 40 mg 1-2 times per day  4. Continue the following if needed:   A. Cetirizine 10 mg - 1 tablet 1-2 time per day    B. Albuterol HFA - 2 inhalations every 6 hours  5. During flare ups    C. Use budesonide 0.25 mg nebulized 4 times per day for 1-2 weeks   6. Influenza = Tamiflu. Covid = Paxlovid  7. We will refer you to Dr. Allena Katz (ENT) due to throat sensation Return to clinic in 4-6 weeks with Dr. Lucie Leather or earlier if problem  Return in about 4 weeks (around 09/08/2023), or if symptoms worsen or fail to improve.    Thank you for the opportunity to care for this patient.  Please do not hesitate to contact me with questions.  Nehemiah Settle, FNP [facility]

## 2023-08-13 DIAGNOSIS — R0683 Snoring: Secondary | ICD-10-CM | POA: Diagnosis not present

## 2023-08-14 ENCOUNTER — Ambulatory Visit
Admission: EM | Admit: 2023-08-14 | Discharge: 2023-08-14 | Disposition: A | Discharge status: Home / Self Care | Attending: Physician Assistant | Admitting: Physician Assistant

## 2023-08-14 ENCOUNTER — Other Ambulatory Visit: Payer: Self-pay

## 2023-08-14 DIAGNOSIS — R300 Dysuria: Secondary | ICD-10-CM | POA: Insufficient documentation

## 2023-08-14 DIAGNOSIS — R8281 Pyuria: Secondary | ICD-10-CM | POA: Diagnosis not present

## 2023-08-14 DIAGNOSIS — J029 Acute pharyngitis, unspecified: Secondary | ICD-10-CM | POA: Diagnosis not present

## 2023-08-14 LAB — POCT URINALYSIS DIP (MANUAL ENTRY)
Bilirubin, UA: NEGATIVE
Blood, UA: NEGATIVE
Glucose, UA: NEGATIVE mg/dL
Ketones, POC UA: NEGATIVE mg/dL
Nitrite, UA: NEGATIVE
Protein Ur, POC: NEGATIVE mg/dL
Spec Grav, UA: >=1.03 — AB
Urobilinogen, UA: 0.2 U/dL
pH, UA: 6

## 2023-08-14 LAB — POCT RAPID STREP A (OFFICE): Rapid Strep A Screen: NEGATIVE

## 2023-08-14 LAB — POC COVID19/FLU A&B COMBO
Covid Antigen, POC: NEGATIVE
Influenza A Antigen, POC: NEGATIVE
Influenza B Antigen, POC: NEGATIVE

## 2023-08-14 NOTE — Discharge Instructions (Signed)
 You were seen today for concerns for persistent sore throat as well as pain with urination. Your testing was negative for flu, strep, COVID. Your urine did show a trace amount of white blood cells but no other abnormalities.  I have sent off a urine sample for culture to assess for bacterial growth.  If there is bacterial growth we will keep you updated and send in an appropriate antibiotic as indicated by the susceptibility testing included in the culture.  For now make sure you are staying well-hydrated and avoid holding your urine for prolonged periods of time.  I recommend changing your antihistamine for the next few weeks to see if this improves your sore throat and nasal congestion.  If you feel like your Flonase  is no longer providing much relief I recommend asking about another nasal spray called Ryaltris as this sometimes provides better relief.  You can talk to your primary care provider or your allergy specialist in regards to the different nasal spray If at any point you start to develop fevers, chills that are not responding medications, significant shortness of breath or trouble breathing, chest pain, confusion or passing out please go to the emergency room as these could be signs of a medical emergency.

## 2023-08-14 NOTE — ED Triage Notes (Signed)
 Pt presents with complaints of burning with urination that started yesterday, 4/17. Pt is also reporting a sore throat "for weeks." Pt currently rates his overall pain a 3/10. OTC Tylenol  taken with little relief. Denies fevers at home. Pt reports his spouse is ill as well.

## 2023-08-14 NOTE — ED Provider Notes (Signed)
 [facility]    CSN: [id number] Arrival date & time: 08/14/23  1449      History   Chief Complaint Chief Complaint  Patient presents with   Dysuria   Sore Throat    HPI Arthur Moore is a 73 y.o. male.   HPI  He reports burning with urination that started a few days ago He denies rash or signs of skin infection in groin He denies concerns for STDs at this time   He states he has had sore throat for some time as well He states it feels like something is in his throat sometimes  He reports that he takes allergy shots and has a previous hx of asthma     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   Chest pain of uncertain etiology 07/08/2018   Chronic cough 06/21/2018   Thrombocytopenia (HCC) 12/01/2016   Transaminitis 12/01/2016   Diastolic dysfunction 06/20/2016   MDD (major depressive disorder), recurrent severe, 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   Hyperlipidemia 08/24/2015   Arthritis    Depression    Hypertension    Aortic insufficiency    Psoriasis    Cervical disc disease    Lumbar disc disease    Asthma 01/06/2015   Psoriatic arthritis (HCC) 01/06/2015   Allergic rhinoconjunctivitis 01/06/2015   GERD (gastroesophageal reflux disease) 01/06/2015   Laryngopharyngeal reflux (LPR) 01/06/2015    Past Surgical History:  Procedure Laterality Date   CHOLECYSTECTOMY  2012    HERNIA REPAIR     KNEE ARTHROSCOPY W/ MENISCAL REPAIR  11/2021   LEFT HEART CATH AND CORONARY ANGIOGRAPHY N/A 07/08/2018   Procedure: LEFT HEART CATH AND CORONARY ANGIOGRAPHY;  Surgeon: Odie Benne, MD;  Location: [facility];  Service: Cardiovascular;  Laterality: N/A;   PAROTIDECTOMY Right 1992   PROSTATE SURGERY  2002, 2004   SINOSCOPY     SINUS EXPLORATION  2013   SPINE SURGERY  2013   L C7-T1 laminectomy and discectomy   TONSILLECTOMY         Home Medications    Prior to Admission medications   Medication Sig Start Date End Date Taking? Authorizing Provider  albuterol  (VENTOLIN  HFA) 108 (90 Base) MCG/ACT inhaler Inhale 2 puffs into the lungs every 4 (four) hours as needed for wheezing or shortness of breath. 07/07/23   Kozlow, Rema Care, MD  aspirin  EC 81 MG tablet Take 81 mg by mouth daily.    [provider]  budesonide  (PULMICORT ) 0.25 MG/2ML nebulizer solution Take 2 mLs (0.25 mg total) by nebulization every 4 (four) hours as needed (During flare ups). 07/07/23   Kozlow, Rema Care, MD  budesonide -formoterol  (SYMBICORT ) 160-4.5 MCG/ACT inhaler Inhale 2 puffs into the lungs 2 (two) times daily. With spacer (empty lungs) 07/07/23   Kozlow, Rema Care, MD  cetirizine  (ZYRTEC  ALLERGY) 10 MG tablet Take  1 tablet (10 mg total) by mouth daily as needed (can take an extra dose during flare ups). 07/07/23   Kozlow, Rema Care, MD  fluticasone  (FLONASE ) 50 MCG/ACT nasal spray Place 2 sprays into both nostrils daily. 1-2 sprays each nostril 1 (ONE) time per day. 03/31/23   Kozlow, Rema Care, MD  lamoTRIgine  (LAMICTAL ) 200 MG tablet Take 1 tablet (200 mg total) by mouth 2 (two) times daily. 02/05/22   Mozingo, Regina Nattalie, NP  metoprolol  tartrate (LOPRESSOR ) 25 MG tablet TAKE 1/2 TABLET BY MOUTH TWICE A DAY 07/21/22   Maudine Sos, MD  nitroGLYCERIN  (NITROSTAT ) 0.4 MG SL tablet Place 1 tablet (0.4 mg total) under the tongue every 5 (five) minutes as needed for chest pain.  07/11/22 03/31/23  Clearnce Curia, NP  pantoprazole  (PROTONIX ) 40 MG tablet Take 1 tablet (40 mg total) by mouth 2 (two) times daily. 07/07/23   Kozlow, Rema Care, MD  Respiratory Therapy Supplies (NEBULIZER MASK ADULT) MISC 1 Device by Does not apply route as directed. 07/07/23   Kozlow, Rema Care, MD  rosuvastatin  (CRESTOR ) 10 MG tablet Take 1 tablet by mouth daily.    [provider]  sertraline (ZOLOFT) 50 MG tablet Take 100 mg by mouth every morning. 05/26/23   [provider]  Spacer/Aero-Holding Idelle Majors DEVI 1 Device by Does not apply route as directed. 07/07/23   Kozlow, Rema Care, MD  UNABLE TO FIND Inject 0.5 mLs into the skin once a week.    [provider]  vortioxetine HBr (TRINTELLIX) 5 MG TABS tablet Take by mouth. 08/05/23   [provider]    Family History Family History  Problem Relation Age of Onset   Colon cancer Mother    Lung cancer Father    Cancer Other    Bipolar disorder Brother    Allergic rhinitis Neg Hx    Angioedema Neg Hx    Asthma Neg Hx    Eczema Neg Hx     Social History Social History   Tobacco Use   Smoking status: Never   Smokeless tobacco: Never  Vaping Use   Vaping status: Never Used  Substance Use Topics   Alcohol use: Not Currently   Drug use: No     Allergies   Patient has no known allergies.   Review of Systems Review of Systems  Constitutional:  Negative for chills and fever.  HENT:  Positive for congestion, postnasal drip, sore throat and voice change. Negative for ear pain and rhinorrhea.   Respiratory:  Positive for cough. Negative for shortness of breath and wheezing.   Gastrointestinal:  Negative for diarrhea, nausea and vomiting.  Genitourinary:  Positive for dysuria.  Musculoskeletal:  Negative for myalgias.     Physical Exam Triage Vital Signs ED Triage Vitals  Encounter Vitals Group     BP 08/14/23 1540 121/68     Systolic BP Percentile --      Diastolic BP Percentile --      Pulse  Rate 08/14/23 1540 65     Resp 08/14/23 1540 18     Temp 08/14/23 1540 98 F (36.7 C)     Temp Source 08/14/23 1540 Oral     SpO2 08/14/23 1540 98 %     Weight 08/14/23 1542 179 lb (81.2 kg)     Height 08/14/23 1542 5' 11.75" (1.822 m)     Head Circumference --      Peak Flow --      Pain Score 08/14/23 1542  3     Pain Loc --      Pain Education --      Exclude from Growth Chart --    No data found.  Updated Vital Signs BP 121/68 (BP Location: Right Arm)   Pulse 65   Temp 98 F (36.7 C) (Oral)   Resp 18   Ht 5' 11.75" (1.822 m)   Wt 179 lb (81.2 kg)   SpO2 98%   BMI 24.45 kg/m   Visual Acuity Right Eye Distance:   Left Eye Distance:   Bilateral Distance:    Right Eye Near:   Left Eye Near:    Bilateral Near:     Physical Exam Vitals reviewed.  Constitutional:      General: He is awake.     Appearance: Normal appearance. He is well-developed and well-groomed.  HENT:     Head: Normocephalic and atraumatic.     Right Ear: Hearing, tympanic membrane and ear canal normal.     Left Ear: Hearing, tympanic membrane and ear canal normal.     Mouth/Throat:     Lips: Pink.     Mouth: Mucous membranes are moist.     Pharynx: Oropharynx is clear. Uvula midline. No pharyngeal swelling, oropharyngeal exudate, posterior oropharyngeal erythema, uvula swelling or postnasal drip.     Tonsils: No tonsillar exudate or tonsillar abscesses.  Eyes:     General: Lids are normal. Gaze aligned appropriately.     Extraocular Movements: Extraocular movements intact.  Cardiovascular:     Rate and Rhythm: Normal rate and regular rhythm.     Heart sounds: Normal heart sounds.  Pulmonary:     Effort: Pulmonary effort is normal.     Breath sounds: Normal breath sounds. No decreased air movement. No decreased breath sounds, wheezing, rhonchi or rales.  Musculoskeletal:     Cervical back: Normal range of motion and neck supple.  Lymphadenopathy:     Head:     Right side of head: No  submental, submandibular or preauricular adenopathy.     Left side of head: No submental, submandibular or preauricular adenopathy.     Cervical:     Right cervical: No superficial cervical adenopathy.    Left cervical: No superficial cervical adenopathy.     Upper Body:     Right upper body: No supraclavicular adenopathy.     Left upper body: No supraclavicular adenopathy.  Skin:    General: Skin is warm and dry.  Neurological:     General: No focal deficit present.     Mental Status: He is alert and oriented to person, place, and time.  Psychiatric:        Mood and Affect: Mood normal.        Behavior: Behavior normal. Behavior is cooperative.        Thought Content: Thought content normal.        Judgment: Judgment normal.      UC Treatments / Results  Labs (all labs ordered are listed, but only abnormal results are displayed) Labs Reviewed  POCT URINALYSIS DIP (MANUAL ENTRY) - Abnormal; Notable for the following components:      Result Value   Spec Grav, UA >=1.030 (*)    Leukocytes, UA Trace (*)    All other components within normal limits  POC COVID19/FLU A&B COMBO - Normal  POCT RAPID STREP A (OFFICE) - Normal  URINE CULTURE    EKG   Radiology No results found.  Procedures Procedures (including critical care  time)  Medications Ordered in UC Medications - No data to display  Initial Impression / Assessment and Plan / UC Course  I have reviewed the triage vital signs and the nursing notes.  Pertinent labs & imaging results that were available during my care of the patient were reviewed by me and considered in my medical decision making (see chart for details).      Final Clinical Impressions(s) / UC Diagnoses   Final diagnoses:  Dysuria  Pyuria  Sore throat   Patient presents today with concerns for sore throat and dysuria.  He reports that dysuria started yesterday 08/13/23.  Urine dip was notable for trace leukocytes.  Given minimal findings on  urine dip will send for culture for definitive rule out.  Patient denies rashes, concern for STDs so less suspicious for yeast infection or genital irritation as cause for symptoms.  Urine culture to dictate further management based on ID and susceptibility testing. Patient reports that he has had a sore throat for several weeks despite using antihistamines, Flonase .  Rapid strep, flu and COVID test were all negative.  Results of urine dip, flu, strep test were discussed with patient during appointment.  At this time suspect sore throat is likely secondary to seasonal allergies as patient does not appear ill-appearing or toxic today and vitals are reassuring.  Physical exam did not reveal concerning findings.  At this time recommend continued use of second-generation histamine, Flonase  or other nasal spray per preference.  Can use Tylenol  as needed for pain management.  ED and return precautions reviewed and provided in after visit summary.  Follow-up as needed.    Discharge Instructions      You were seen today for concerns for persistent sore throat as well as pain with urination. Your testing was negative for flu, strep, COVID. Your urine did show a trace amount of white blood cells but no other abnormalities.  I have sent off a urine sample for culture to assess for bacterial growth.  If there is bacterial growth we will keep you updated and send in an appropriate antibiotic as indicated by the susceptibility testing included in the culture.  For now make sure you are staying well-hydrated and avoid holding your urine for prolonged periods of time.  I recommend changing your antihistamine for the next few weeks to see if this improves your sore throat and nasal congestion.  If you feel like your Flonase  is no longer providing much relief I recommend asking about another nasal spray called Ryaltris as this sometimes provides better relief.  You can talk to your primary care provider or your allergy  specialist in regards to the different nasal spray If at any point you start to develop fevers, chills that are not responding medications, significant shortness of breath or trouble breathing, chest pain, confusion or passing out please go to the emergency room as these could be signs of a medical emergency.     ED Prescriptions   None    PDMP not reviewed this encounter.   Jerona Mooring, PA-C 08/14/23 1751

## 2023-08-16 LAB — URINE CULTURE: Culture: <10000 — AB

## 2023-08-19 DIAGNOSIS — H40013 Open angle with borderline findings, low risk, bilateral: Secondary | ICD-10-CM | POA: Diagnosis not present

## 2023-08-19 DIAGNOSIS — H2513 Age-related nuclear cataract, bilateral: Secondary | ICD-10-CM | POA: Diagnosis not present

## 2023-08-19 DIAGNOSIS — H43392 Other vitreous opacities, left eye: Secondary | ICD-10-CM | POA: Diagnosis not present

## 2023-08-19 DIAGNOSIS — H04123 Dry eye syndrome of bilateral lacrimal glands: Secondary | ICD-10-CM | POA: Diagnosis not present

## 2023-08-20 DIAGNOSIS — M25561 Pain in right knee: Secondary | ICD-10-CM | POA: Diagnosis not present

## 2023-08-20 DIAGNOSIS — Z9889 Other specified postprocedural states: Secondary | ICD-10-CM | POA: Diagnosis not present

## 2023-08-20 DIAGNOSIS — M25562 Pain in left knee: Secondary | ICD-10-CM | POA: Diagnosis not present

## 2023-08-24 DIAGNOSIS — G4733 Obstructive sleep apnea (adult) (pediatric): Secondary | ICD-10-CM | POA: Diagnosis not present

## 2023-08-24 DIAGNOSIS — R070 Pain in throat: Secondary | ICD-10-CM | POA: Diagnosis not present

## 2023-08-26 DIAGNOSIS — I10 Essential (primary) hypertension: Secondary | ICD-10-CM | POA: Diagnosis not present

## 2023-08-26 DIAGNOSIS — F411 Generalized anxiety disorder: Secondary | ICD-10-CM | POA: Diagnosis not present

## 2023-08-26 DIAGNOSIS — I25118 Atherosclerotic heart disease of native coronary artery with other forms of angina pectoris: Secondary | ICD-10-CM | POA: Diagnosis not present

## 2023-08-26 DIAGNOSIS — I700 Atherosclerosis of aorta: Secondary | ICD-10-CM | POA: Diagnosis not present

## 2023-08-26 DIAGNOSIS — E7800 Pure hypercholesterolemia, unspecified: Secondary | ICD-10-CM | POA: Diagnosis not present

## 2023-08-27 DIAGNOSIS — G4733 Obstructive sleep apnea (adult) (pediatric): Secondary | ICD-10-CM | POA: Diagnosis not present

## 2023-08-27 DIAGNOSIS — M25512 Pain in left shoulder: Secondary | ICD-10-CM | POA: Diagnosis not present

## 2023-08-28 DIAGNOSIS — M7711 Lateral epicondylitis, right elbow: Secondary | ICD-10-CM | POA: Diagnosis not present

## 2023-09-02 ENCOUNTER — Encounter (HOSPITAL_BASED_OUTPATIENT_CLINIC_OR_DEPARTMENT_OTHER): Payer: Self-pay | Admitting: Cardiovascular Disease

## 2023-09-07 NOTE — Patient Instructions (Incomplete)
  1. Continue Symbicort  160 - 2 puffs twice a day with spacer (empty lungs) Information given previously on Fasenra, Nucala, and Dupixent. After reviewing please call our office and let us  know which biologic you would like to start  2. Continue Flonase  1-2 sprays each nostril one time per day   3. Continue Protonix  40 mg 1-2 times per day  4. Continue the following if needed:   A. Cetirizine  10 mg - 1 tablet 1-2 time per day    B. Albuterol  HFA - 2 inhalations every 6 hours  5. During flare ups   C. Use budesonide  0.25 mg nebulized 4 times per day for 1-2 weeks   6. Influenza = Tamiflu. Covid = Paxlovid   Return to clinic in months or earlier if problem

## 2023-09-08 ENCOUNTER — Ambulatory Visit (INDEPENDENT_AMBULATORY_CARE_PROVIDER_SITE_OTHER): Admitting: Family

## 2023-09-08 ENCOUNTER — Encounter: Payer: Self-pay | Admitting: Family

## 2023-09-08 ENCOUNTER — Ambulatory Visit: Payer: Medicare Other

## 2023-09-08 ENCOUNTER — Other Ambulatory Visit: Payer: Self-pay

## 2023-09-08 ENCOUNTER — Institutional Professional Consult (permissible substitution): Payer: Medicare Other | Admitting: Psychology

## 2023-09-08 VITALS — BP 122/62 | HR 66 | Temp 98.3°F | Resp 12

## 2023-09-08 DIAGNOSIS — K219 Gastro-esophageal reflux disease without esophagitis: Secondary | ICD-10-CM | POA: Diagnosis not present

## 2023-09-08 DIAGNOSIS — J4540 Moderate persistent asthma, uncomplicated: Secondary | ICD-10-CM | POA: Diagnosis not present

## 2023-09-08 DIAGNOSIS — J3089 Other allergic rhinitis: Secondary | ICD-10-CM | POA: Diagnosis not present

## 2023-09-08 DIAGNOSIS — L299 Pruritus, unspecified: Secondary | ICD-10-CM

## 2023-09-08 NOTE — Progress Notes (Signed)
 [address] [place] [address] Dept: [phone]  FOLLOW UP NOTE  Patient ID: Arthur Moore, male    DOB: Mar 11, 1951  Age: 73 y.o. MRN: [id number] Date of Office Visit: 09/08/2023  Assessment  Chief Complaint: Follow-up (Asthma/Allergy/)  HPI Arthur Moore is a 73 year old male who presents today for follow-up of allergic rhinitis, not well-controlled moderate persistent asthma, laryngopharyngeal reflux disease, and pruritic disorder.  He was last seen by myself on August 11, 2023.  He reports that Thursday he will have left cataract surgery.  He also mentions that he is getting a MRI next week for his right arm.  He also has bursitis in his left shoulder.  His wife is here with him today and helps provide history.  Moderate persistent asthma: He continues to take Symbicort  160/4.5 mcg 2 puffs twice a day with a spacer, budesonide  0.25 mg nebulized 4 times a day for 1 to 2 weeks during flareups, and albuterol  as needed.  He reports wheezing, shortness of breath, nonproductive cough, tightness in chest, and nocturnal awakenings due to breathing problems sometimes since his last office visit.  Since his last office visit he has not made any trips to the emergency room or urgent care due to breathing problems.  He has received a couple steroid injections for his shoulders/arms since his last office visit.  When he received the steroid injections he does report that his breathing was not as bad.  He has not used budesonide  0.25 mg for flares in the past 2 to 3 weeks.  He is using his albuterol  2-3 times a week for the past couple months.  He has not used his albuterol  via the nebulizer in the past couple months.  He mentions that he does not really want to start a biologic due to having stage II liver disease and stage II kidney disease.  He is also leery of medications.  Allergic rhinitis: He reports clear rhinorrhea, nasal congestion, and postnasal drip.  He has had 1 sinus infection  since we last saw him.  He also mentions his eyes have been itchy.  He does feel like his allergies have been worse this year. These symptoms have settled down since the rain.  He continues to receive allergy injections per protocol and takes Zyrtec  10 mg daily and uses Flonase  nasal spray as needed.  Laryngopharyngeal reflux disease: He reports that this has been pretty good.  He does take Protonix  once a day.  His wife mentions that when she is around she makes him take it twice a day.   Drug Allergies:  No Known Allergies  Review of Systems: Negative except as per HPI   Physical Exam: BP 122/62   Pulse 66   Temp 98.3 F (36.8 C)   Resp 12   SpO2 95%    Physical Exam Constitutional:      Appearance: Normal appearance.  HENT:     Head: Normocephalic and atraumatic.     Comments: Pharynx normal, eyes normal, ears normal, nose: Bilateral lower turbinates mildly edematous with no drainage noted    Right Ear: Tympanic membrane, ear canal and external ear normal.     Left Ear: Tympanic membrane, ear canal and external ear normal.     Mouth/Throat:     Mouth: Mucous membranes are moist.     Pharynx: Oropharynx is clear.  Eyes:     Conjunctiva/sclera: Conjunctivae normal.  Cardiovascular:     Rate and Rhythm: Regular rhythm.  Heart sounds: Normal heart sounds.  Pulmonary:     Effort: Pulmonary effort is normal.     Breath sounds: Normal breath sounds.     Comments: Lungs clear to auscultation Musculoskeletal:     Cervical back: Neck supple.  Skin:    General: Skin is warm.  Neurological:     Mental Status: He is alert and oriented to person, place, and time.  Psychiatric:        Mood and Affect: Mood normal.        Behavior: Behavior normal.        Thought Content: Thought content normal.        Judgment: Judgment normal.     Diagnostics:  None  Assessment and Plan: 1. Not well controlled moderate persistent asthma   2. Other allergic rhinitis   3. LPRD  (laryngopharyngeal reflux disease)   4. Pruritic disorder     No orders of the defined types were placed in this encounter.   Patient Instructions   1. Continue Symbicort  160 - 2 puffs twice a day with spacer (empty lungs) Continue to consider an asthma biologic.Information given previously on Fasenra, Nucala, and Dupixent. After reviewing please call our office and let us  know which biologic you would like to start  2. Continue Flonase  1-2 sprays each nostril one time per day   3. Continue Protonix  40 mg 1-2 times per day  4. Continue the following if needed:   A. Cetirizine  10 mg - 1 tablet 1-2 time per day    B. Albuterol  HFA - 2 inhalations every 6 hours  5. During flare ups   C. Use budesonide  0.25 mg nebulized 4 times per day for 1-2 weeks   Return to clinic in 2-3 months or earlier if problem Return in about 3 months (around 12/09/2023), or if symptoms worsen or fail to improve.    Thank you for the opportunity to care for this patient.  Please do not hesitate to contact me with questions.  Tinnie Forehand, FNP [facility] 

## 2023-09-10 DIAGNOSIS — H2512 Age-related nuclear cataract, left eye: Secondary | ICD-10-CM | POA: Diagnosis not present

## 2023-09-15 ENCOUNTER — Ambulatory Visit (INDEPENDENT_AMBULATORY_CARE_PROVIDER_SITE_OTHER)

## 2023-09-15 DIAGNOSIS — N5201 Erectile dysfunction due to arterial insufficiency: Secondary | ICD-10-CM | POA: Diagnosis not present

## 2023-09-15 DIAGNOSIS — R350 Frequency of micturition: Secondary | ICD-10-CM | POA: Diagnosis not present

## 2023-09-15 DIAGNOSIS — N281 Cyst of kidney, acquired: Secondary | ICD-10-CM | POA: Diagnosis not present

## 2023-09-15 DIAGNOSIS — J309 Allergic rhinitis, unspecified: Secondary | ICD-10-CM

## 2023-09-15 LAB — LAB REPORT - SCANNED: EGFR (Non-African Amer.): 62

## 2023-09-18 ENCOUNTER — Encounter: Payer: Self-pay | Admitting: Family

## 2023-09-18 ENCOUNTER — Ambulatory Visit (INDEPENDENT_AMBULATORY_CARE_PROVIDER_SITE_OTHER): Admitting: Family

## 2023-09-18 ENCOUNTER — Ambulatory Visit
Admission: RE | Admit: 2023-09-18 | Discharge: 2023-09-18 | Disposition: A | Discharge status: Home / Self Care | Source: Ambulatory Visit | Attending: Nurse Practitioner | Admitting: Nurse Practitioner

## 2023-09-18 ENCOUNTER — Ambulatory Visit

## 2023-09-18 ENCOUNTER — Ambulatory Visit (INDEPENDENT_AMBULATORY_CARE_PROVIDER_SITE_OTHER): Admitting: Radiology

## 2023-09-18 ENCOUNTER — Other Ambulatory Visit: Payer: Self-pay

## 2023-09-18 VITALS — BP 140/88 | HR 70 | Resp 18 | Wt 187.0 lb

## 2023-09-18 VITALS — BP 133/75 | HR 75 | Temp 98.8°F | Resp 20

## 2023-09-18 DIAGNOSIS — J069 Acute upper respiratory infection, unspecified: Secondary | ICD-10-CM | POA: Diagnosis not present

## 2023-09-18 DIAGNOSIS — I700 Atherosclerosis of aorta: Secondary | ICD-10-CM | POA: Diagnosis not present

## 2023-09-18 DIAGNOSIS — R059 Cough, unspecified: Secondary | ICD-10-CM | POA: Diagnosis not present

## 2023-09-18 DIAGNOSIS — J3089 Other allergic rhinitis: Secondary | ICD-10-CM

## 2023-09-18 DIAGNOSIS — K219 Gastro-esophageal reflux disease without esophagitis: Secondary | ICD-10-CM

## 2023-09-18 DIAGNOSIS — J4541 Moderate persistent asthma with (acute) exacerbation: Secondary | ICD-10-CM

## 2023-09-18 DIAGNOSIS — R0602 Shortness of breath: Secondary | ICD-10-CM | POA: Diagnosis not present

## 2023-09-18 DIAGNOSIS — R062 Wheezing: Secondary | ICD-10-CM | POA: Diagnosis not present

## 2023-09-18 DIAGNOSIS — R051 Acute cough: Secondary | ICD-10-CM

## 2023-09-18 DIAGNOSIS — R52 Pain, unspecified: Secondary | ICD-10-CM

## 2023-09-18 LAB — POC COVID19/FLU A&B COMBO
Covid Antigen, POC: NEGATIVE
Influenza A Antigen, POC: NEGATIVE
Influenza B Antigen, POC: NEGATIVE

## 2023-09-18 MED ORDER — IPRATROPIUM-ALBUTEROL 0.5-2.5 (3) MG/3ML IN SOLN
3.0000 mL | Freq: Once | RESPIRATORY_TRACT | Status: AC
  Administered 2023-09-18: 3 mL via RESPIRATORY_TRACT

## 2023-09-18 MED ORDER — AZITHROMYCIN 500 MG PO TABS
500.0000 mg | ORAL_TABLET | Freq: Every day | ORAL | 0 refills | Status: AC
Start: 2023-09-18 — End: 2023-09-23

## 2023-09-18 MED ORDER — PREDNISONE 10 MG PO TABS: ORAL_TABLET | ORAL | 0 refills | Status: DC

## 2023-09-18 MED ORDER — METHYLPREDNISOLONE ACETATE 40 MG/ML IJ SUSP
40.0000 mg | Freq: Once | INTRAMUSCULAR | Status: AC
Start: 2023-09-18 — End: 2023-09-18
  Administered 2023-09-18: 40 mg via INTRAMUSCULAR

## 2023-09-18 MED ORDER — METHYLPREDNISOLONE SODIUM SUCC 125 MG IJ SOLR
62.5000 mg | Freq: Once | INTRAMUSCULAR | Status: AC
  Administered 2023-09-18: 62.5 mg via INTRAMUSCULAR

## 2023-09-18 MED ORDER — PREDNISONE 10 MG (21) PO TBPK: ORAL_TABLET | Freq: Every day | ORAL | 0 refills | Status: DC

## 2023-09-18 MED ORDER — CEFTRIAXONE SODIUM 1 G IJ SOLR
1.0000 g | Freq: Once | INTRAMUSCULAR | Status: AC
  Administered 2023-09-18: 1 g via INTRAMUSCULAR

## 2023-09-18 MED ORDER — MUCINEX DM MAXIMUM STRENGTH 60-1200 MG PO TB12: 1.0000 | ORAL_TABLET | Freq: Two times a day (BID) | ORAL | 0 refills | Status: DC

## 2023-09-18 MED ORDER — AMOXICILLIN-POT CLAVULANATE 875-125 MG PO TABS: 1.0000 | ORAL_TABLET | Freq: Two times a day (BID) | ORAL | 0 refills | Status: DC

## 2023-09-18 MED ORDER — AZITHROMYCIN 500 MG PO TABS: 500.0000 mg | ORAL_TABLET | Freq: Every day | ORAL | 0 refills | Status: DC

## 2023-09-18 MED ORDER — HYDROCOD POLI-CHLORPHE POLI ER 10-8 MG/5ML PO SUER: 5.0000 mL | Freq: Every evening | ORAL | 0 refills | Status: DC | PRN

## 2023-09-18 MED ORDER — PROMETHAZINE-DM 6.25-15 MG/5ML PO SYRP: 10.0000 mL | ORAL_SOLUTION | Freq: Four times a day (QID) | ORAL | 0 refills | Status: DC | PRN

## 2023-09-18 NOTE — Patient Instructions (Addendum)
 1. Continue Symbicort  160 - 2 puffs twice a day with spacer (empty lungs) Continue to consider an asthma biologic.Information given previously on Fasenra, Nucala, and Dupixent. After reviewing please call our office and let us  know which biologic you would like to start  2. Continue Flonase  1-2 sprays each nostril one time per day   3. Continue Protonix  40 mg 1-2 times per day  4. Continue the following if needed:   A. Cetirizine  10 mg - 1 tablet 1-2 time per day    B. Albuterol  HFA - 2 inhalations every 6 hours  5. During flare ups   C. Use budesonide  0.25 mg nebulized 4 times per day for 1-2 weeks  6.  Depo 40 mg IM given in office 7.  Starting tomorrow Sep 19, 2023 take prednisone  10 mg 2 tablets twice a day for 3 days, then on the fourth day take 2 tablets in the morning, and the fifth day take 1 tablet and stop. 8.  Recommend going to urgent care to get tested for influenza, COVID-19, and RSV as we do not have that capability here.  They are also able to do a chest x-ray.  Your symptoms worsen or persist over the weekend please go to the emergency room.  Return to clinic in 2-3 months or earlier if problem

## 2023-09-18 NOTE — Progress Notes (Signed)
 [address] [place] [address] Dept: [phone]  FOLLOW UP NOTE  Patient ID: Arthur Moore, male    DOB: [birth year]  Age: 73 y.o. MRN: [id number] Date of Office Visit: 09/18/2023  Assessment  Chief Complaint: Follow-up (Diff breathing increased asthma flaire, had left eye catarac surgery 2 weeks ago on prednisolone phosphate 1% with moxifloxican  0.5% and bromfenac0.075%) and Cough  HPI Arthur Moore is a 73 year old male who presents today for acute visit of cough.  He was last seen by myself on Sep 08, 2023 for not well-controlled moderate persistent asthma, allergic rhinitis, laryngopharyngeal reflux disease, and pruritic disorder.  He reports since his last office visit he had left eye cataract surgery last Thursday.  He denies any other new diagnosis since his last office visit.  Asthma: He continues to take Symbicort  160/4.5 mcg 2 puffs twice a day and has budesonide  0.25 mg to use nebulized 4 times a day during flareups for 1 to 2 weeks.  He reports that on Wednesday he went to a funeral and then his asthma started acting up.  He developed shortness of breath and a deep cough.  It now hurts his chest for him to cough.  He asked if we have capability of doing a chest x-ray.  At times the cough can be productive and what he gets up is clear in color other times the cough is nonproductive.  He also reports wheezing, tightness in his chest, and nocturnal awakenings due to breathing problems.  He denies fevers, chills, and known sick contacts.  He does report a little bit more body aches than normal and a headache.  He has used his nebulizer 6 times in the past 24 hours and reports that it is not doing what it used to.  He did initiate the budesonide  0.25 mg 4 times a day plus using the albuterol  several times.  Since his last office visit he has not required any systemic steroids or made any trips to the emergency room or urgent care, but mention that he thought about going to  urgent care.  He reports that he feels lousy.  Allergic rhinitis: He reports nasal congestion and postnasal drip.  He denies rhinorrhea and sore throat.  He does take Zyrtec  10 mg once a day and Flonase  nasal spray daily.  He has not been treated for any sinus infections since we last saw him.    Laryngopharyngeal reflux disease is reported as under control.  He has Protonix  40 mg to take 1-2 times a day.   Drug Allergies:  No Known Allergies  Review of Systems: Negative except as per HPI   Physical Exam: BP (!) 140/88   Pulse 70   Resp 18   Wt 187 lb (84.8 kg)   SpO2 95%   BMI 25.54 kg/m    Physical Exam Constitutional:      Appearance: Normal appearance.  HENT:     Head: Normocephalic and atraumatic.     Comments: Pharynx normal, eyes normal, ears normal, nose: Bilateral lower turbinates mildly edematous with no drainage noted    Right Ear: Tympanic membrane, ear canal and external ear normal.     Left Ear: Tympanic membrane, ear canal and external ear normal.     Mouth/Throat:     Mouth: Mucous membranes are moist.     Pharynx: Oropharynx is clear.  Eyes:     Conjunctiva/sclera: Conjunctivae normal.  Cardiovascular:     Rate and Rhythm: Regular rhythm.  Heart sounds: Normal heart sounds.  Pulmonary:     Effort: Pulmonary effort is normal.     Breath sounds: Normal breath sounds.     Comments: Lungs clear to auscultation Musculoskeletal:     Cervical back: Neck supple.  Skin:    General: Skin is warm.  Neurological:     Mental Status: He is alert and oriented to person, place, and time.  Psychiatric:        Mood and Affect: Mood normal.        Behavior: Behavior normal.        Thought Content: Thought content normal.        Judgment: Judgment normal.     Diagnostics: Spirometry not completed today due to symptoms  Assessment and Plan: 1. Moderate persistent asthma with (acute) exacerbation   2. Body aches   3. Other allergic rhinitis   4. LPRD  (laryngopharyngeal reflux disease)     Meds ordered this encounter  Medications   predniSONE  (DELTASONE ) 10 MG tablet    Sig: Starting tomorrow (Sep 19, 2023) take prednisone  10 mg 2 tablets twice a day for 3 days, then on the fourth day take 2 tablets in the morning, and on the fifth day take 1 tablet and stop    Dispense:  15 tablet    Refill:  0   methylPREDNISolone  acetate (DEPO-MEDROL ) injection 40 mg    Patient Instructions  1. Continue Symbicort  160 - 2 puffs twice a day with spacer (empty lungs) Continue to consider an asthma biologic.Information given previously on Fasenra, Nucala, and Dupixent. After reviewing please call our office and let us  know which biologic you would like to start  2. Continue Flonase  1-2 sprays each nostril one time per day   3. Continue Protonix  40 mg 1-2 times per day  4. Continue the following if needed:   A. Cetirizine  10 mg - 1 tablet 1-2 time per day    B. Albuterol  HFA - 2 inhalations every 6 hours  5. During flare ups   C. Use budesonide  0.25 mg nebulized 4 times per day for 1-2 weeks  6.  Depo 40 mg IM given in office 7.  Starting tomorrow Sep 19, 2023 take prednisone  10 mg 2 tablets twice a day for 3 days, then on the fourth day take 2 tablets in the morning, and the fifth day take 1 tablet and stop. 8.  Recommend going to urgent care to get tested for influenza, COVID-19, and RSV as we do not have that capability here.  They are also able to do a chest x-ray.  Your symptoms worsen or persist over the weekend please go to the emergency room.  Return to clinic in 2-3 months or earlier if problem Return in about 2 months (around 11/18/2023), or if symptoms worsen or fail to improve.    Thank you for the opportunity to care for this patient.  Please do not hesitate to contact me with questions.  Tinnie Forehand, FNP [facility] 

## 2023-09-18 NOTE — ED Provider Notes (Signed)
 [facility]    CSN: [id number] Arrival date & time: 09/18/23  1701      History   Chief Complaint Chief Complaint  Patient presents with   Cough    Hist : Asthma   ,   cough Entered by patient    HPI Arthur Moore is a 73 y.o. male.   Arthur Moore is a 73 y.o. male with history of asthma presents with worsening respiratory symptoms and cough that started on Wednesday after attending a funeral. The patient reports developing asthma attacks followed by a progressively worsening cough overnight. The cough is described as painful due to its frequency. Associated symptoms include sneezing, headache, and nasal congestion. The patient denies fevers, chills, or body aches. The patient has been using their nebulizer frequently to manage symptoms. Earlier today, they visited an asthma/allergy specialist who administered a steroid shot and recommended an urgent care visit for a chest x-ray. The patient's last nebulizer treatment was at 3 a.m. The patient's current asthma and allergy medication regimen includes albuterol  nebulizer, budesonide  nebulizer, Symbicort , Zyrtec  and Flonase . He was prescribed oral steroids by the allergy specialist to start on tomorrow (May 24th).   The following portions of the patient's history were reviewed and updated as appropriate: allergies, current medications, past family history, past medical history, past social history, past surgical history, and problem list.       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   Chest pain of uncertain etiology 07/08/2018   Chronic cough 06/21/2018   Thrombocytopenia (HCC) 12/01/2016   Transaminitis 12/01/2016   Diastolic dysfunction 06/20/2016   MDD (major depressive disorder), recurrent severe, 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   Hyperlipidemia 08/24/2015   Arthritis    Depression    Hypertension    Aortic insufficiency    Psoriasis    Cervical disc disease    Lumbar disc disease    Asthma 01/06/2015   Psoriatic arthritis (HCC) 01/06/2015   Allergic rhinoconjunctivitis 01/06/2015   GERD (gastroesophageal reflux disease) 01/06/2015   Laryngopharyngeal reflux (LPR) 01/06/2015    Past Surgical History:  Procedure Laterality Date   CHOLECYSTECTOMY  2012   HERNIA REPAIR     KNEE ARTHROSCOPY W/ MENISCAL REPAIR  11/2021   LEFT HEART CATH AND CORONARY ANGIOGRAPHY N/A 07/08/2018   Procedure: LEFT HEART CATH AND CORONARY ANGIOGRAPHY;  Surgeon: Odie Benne, MD;  Location: [facility];  Service: Cardiovascular;  Laterality: N/A;   PAROTIDECTOMY Right 1992   PROSTATE SURGERY  2002, 2004   SINOSCOPY     SINUS EXPLORATION  2013   SPINE SURGERY  2013   L C7-T1 laminectomy and discectomy   TONSILLECTOMY         Home Medications    Prior to Admission medications   Medication Sig Start Date End Date Taking? Authorizing Provider  amoxicillin -clavulanate (AUGMENTIN ) 875-125  MG tablet Take 1 tablet by mouth every 12 (twelve) hours. 09/18/23  Yes Lania Zawistowski, FNP  chlorpheniramine-HYDROcodone (TUSSIONEX) 10-8 MG/5ML Take 5 mLs by mouth at bedtime as needed for cough. 09/18/23  Yes Dmari Schubring, Ova Bloomer, FNP  Dextromethorphan-guaiFENesin (MUCINEX DM MAXIMUM STRENGTH) 60-1200 MG TB12 Take 1 tablet by mouth 2 (two) times daily. 09/18/23  Yes Maryruth Sol, FNP  predniSONE  (STERAPRED UNI-PAK 21  TAB) 10 MG (21) TBPK tablet Take by mouth daily. Take 6 tabs by mouth daily  for 2 days, then 5 tabs for 2 days, then 4 tabs for 2 days, then 3 tabs for 2 days, 2 tabs for 2 days, then 1 tab by mouth daily for 2 days START IN AM OF 09/19/23 09/18/23  Yes Maryruth Sol, FNP  promethazine-dextromethorphan (PROMETHAZINE-DM) 6.25-15 MG/5ML syrup Take 10 mLs by mouth every 6 (six) hours as needed for cough. 09/18/23  Yes Maryruth Sol, FNP  albuterol  (VENTOLIN  HFA) 108 (90 Base) MCG/ACT inhaler Inhale 2 puffs into the lungs every 4 (four) hours as needed for wheezing or shortness of breath. 07/07/23   Kozlow, Rema Care, MD  aspirin  EC 81 MG tablet Take 81 mg by mouth daily.    [provider]  azithromycin (ZITHROMAX) 500 MG tablet Take 1 tablet (500 mg total) by mouth daily for 5 days. 09/18/23 09/23/23  Maryruth Sol, FNP  budesonide  (PULMICORT ) 0.25 MG/2ML nebulizer solution Take 2 mLs (0.25 mg total) by nebulization every 4 (four) hours as needed (During flare ups). 07/07/23   Kozlow, Rema Care, MD  budesonide -formoterol  (SYMBICORT ) 160-4.5 MCG/ACT inhaler Inhale 2 puffs into the lungs 2 (two) times daily. With spacer (empty lungs) 07/07/23   Kozlow, Rema Care, MD  cetirizine  (ZYRTEC  ALLERGY) 10 MG tablet Take 1 tablet (10 mg total) by mouth daily as needed (can take an extra dose during flare ups). 07/07/23   Kozlow, Rema Care, MD  fluticasone  (FLONASE ) 50 MCG/ACT nasal spray Place 2 sprays into both nostrils daily. 1-2 sprays each nostril 1 (ONE) time per day. 03/31/23   Kozlow, Rema Care, MD  lamoTRIgine  (LAMICTAL ) 200 MG tablet Take 1 tablet (200 mg total) by mouth 2 (two) times daily. 02/05/22   Mozingo, Regina Nattalie, NP  metoprolol  tartrate (LOPRESSOR ) 25 MG tablet TAKE 1/2 TABLET BY MOUTH TWICE A DAY 07/21/22   Maudine Sos, MD  nitroGLYCERIN  (NITROSTAT ) 0.4 MG SL tablet Place 1 tablet (0.4 mg total) under the tongue every 5 (five) minutes as needed for chest pain. 07/11/22 03/31/23  Clearnce Curia, NP  pantoprazole  (PROTONIX ) 40 MG tablet Take 1 tablet (40 mg total) by mouth 2 (two) times daily. 07/07/23   Kozlow, Rema Care, MD  Respiratory Therapy Supplies (NEBULIZER MASK ADULT) MISC 1 Device by Does not apply route as directed. 07/07/23   Kozlow, Rema Care, MD  rosuvastatin  (CRESTOR ) 10 MG tablet Take 1 tablet by mouth daily.    [provider]  sertraline (ZOLOFT) 50 MG tablet Take 100 mg by mouth every morning. 05/26/23   [provider]  Spacer/Aero-Holding Idelle Majors DEVI 1 Device by Does not apply route as directed. 07/07/23   Kozlow, Rema Care, MD  UNABLE TO FIND Inject 0.5 mLs into the skin once a week.    [provider]  vortioxetine HBr (TRINTELLIX) 5 MG TABS tablet Take by mouth. 08/05/23   [provider]    Family History Family History  Problem Relation Age of Onset   Colon cancer Mother    Lung cancer Father  Cancer Other    Bipolar disorder Brother    Allergic rhinitis Neg Hx    Angioedema Neg Hx    Asthma Neg Hx    Eczema Neg Hx     Social History Social History   Tobacco Use   Smoking status: Never   Smokeless tobacco: Never  Vaping Use   Vaping status: Never Used  Substance Use Topics   Alcohol use: Not Currently   Drug use: No     Allergies   Patient has no known allergies.   Review of Systems Review of Systems  Constitutional:  Negative for chills and fever.  HENT:  Positive for congestion, rhinorrhea and sneezing. Negative for sore throat.   Respiratory:  Positive for cough, shortness of breath and wheezing.   Gastrointestinal:  Negative for diarrhea, nausea and vomiting.  Musculoskeletal:  Positive for myalgias.  Neurological:  Positive for headaches.  All other systems reviewed and are negative.    Physical Exam Triage Vital Signs ED Triage Vitals [09/18/23 1711]  Encounter Vitals Group     BP 133/75     Systolic BP Percentile      Diastolic BP Percentile      Pulse Rate 75     Resp 20      Temp 98.8 F (37.1 C)     Temp Source Oral     SpO2 93 %     Weight      Height      Head Circumference      Peak Flow      Pain Score      Pain Loc      Pain Education      Exclude from Growth Chart    No data found.  Updated Vital Signs BP 133/75 (BP Location: Right Arm)   Pulse 75   Temp 98.8 F (37.1 C) (Oral)   Resp 20   SpO2 92%   Visual Acuity Right Eye Distance:   Left Eye Distance:   Bilateral Distance:    Right Eye Near:   Left Eye Near:    Bilateral Near:     Physical Exam Vitals and nursing note reviewed.  Constitutional:      General: He is awake. He is not in acute distress.    Appearance: Normal appearance. He is well-developed. He is ill-appearing. He is not toxic-appearing or diaphoretic.  HENT:     Head: Normocephalic.     Right Ear: Hearing, tympanic membrane, ear canal and external ear normal.     Left Ear: Hearing, tympanic membrane, ear canal and external ear normal.     Nose: Congestion present.     Mouth/Throat:     Mouth: Mucous membranes are moist.     Pharynx: Oropharynx is clear. Uvula midline.  Eyes:     General: Vision grossly intact.     Conjunctiva/sclera: Conjunctivae normal.  Cardiovascular:     Rate and Rhythm: Normal rate and regular rhythm.     Heart sounds: Normal heart sounds.  Pulmonary:     Effort: Pulmonary effort is normal. No tachypnea or respiratory distress.     Breath sounds: No decreased air movement. Examination of the right-lower field reveals decreased breath sounds. Examination of the left-lower field reveals decreased breath sounds. Decreased breath sounds present. No wheezing or rhonchi.  Musculoskeletal:        General: Normal range of motion.     Cervical back: Full passive range of motion without pain, normal range of motion and  neck supple.  Lymphadenopathy:     Cervical: No cervical adenopathy.  Skin:    General: Skin is warm and dry.  Neurological:     General: No focal deficit present.      Mental Status: He is alert and oriented to person, place, and time.  Psychiatric:        Behavior: Behavior is cooperative.      UC Treatments / Results  Labs (all labs ordered are listed, but only abnormal results are displayed) Labs Reviewed  POC COVID19/FLU A&B COMBO    EKG   Radiology DG Chest 2 View Result Date: 09/18/2023 CLINICAL DATA:  cough, wheeze, sob - hx asthma EXAM: CHEST - 2 VIEW COMPARISON:  July 06, 2023 FINDINGS: No focal airspace consolidation, pleural effusion, or pneumothorax. No cardiomegaly. Aortic atherosclerosis. No acute fracture or destructive lesion. Multilevel thoracic osteophytosis. IMPRESSION: No acute cardiopulmonary abnormality. Electronically Signed   By: Rance Burrows M.D.   On: 09/18/2023 19:24    Procedures Procedures (including critical care time)  Medications Ordered in UC Medications  ipratropium-albuterol  (DUONEB) 0.5-2.5 (3) MG/3ML nebulizer solution 3 mL (3 mLs Nebulization Given 09/18/23 1907)  cefTRIAXone  (ROCEPHIN ) injection 1 g (1 g Intramuscular Given 09/18/23 1901)  methylPREDNISolone  sodium succinate (SOLU-MEDROL ) 125 mg/2 mL injection 62.5 mg (62.5 mg Intramuscular Given 09/18/23 1903)    Initial Impression / Assessment and Plan / UC Course  I have reviewed the triage vital signs and the nursing notes.  Pertinent labs & imaging results that were available during my care of the patient were reviewed by me and considered in my medical decision making (see chart for details).     Patient presents with worsening asthma symptoms since Wednesday after attending a funeral, with increased frequency of asthma attacks, cough, sneezing, headache, and nasal congestion. He denies fever, chills, or body aches. He received a 40 mg steroid injection earlier today from his asthma and allergy specialist. Oxygen  saturation is at the low end of normal at 92-93%. Lung exam revealed relatively clear breath sounds despite reported symptoms. Flu and  COVID tests were negative. Chest X-ray showed no evidence of pneumonia or other acute abnormalities. Given his clinical presentation and history, this is most consistent with a viral illness exacerbating underlying asthma. Treatment plan includes azithromycin, Augmentin , and a prednisone  Dosepak. Patient instructed to complete the full course of all prescribed medications even if symptoms improve. Additional therapies include Mucinex DM twice daily, Promethazine DM as needed for cough, and Tussionex at bedtime for nighttime symptom relief. He was advised to continue his routine allergy and asthma medications and to monitor symptoms and oxygen  levels at home. Emergency precautions and indicators for immediate ED evaluation were reviewed in detail with both the patient and his wife.  Today's evaluation has revealed no signs of a dangerous process. Discussed diagnosis with patient and/or guardian. Patient and/or guardian aware of their diagnosis, possible red flag symptoms to watch out for and need for close follow up. Patient and/or guardian understands verbal and written discharge instructions. Patient and/or guardian comfortable with plan and disposition.  Patient and/or guardian has a clear mental status at this time, good insight into illness (after discussion and teaching) and has clear judgment to make decisions regarding their care  Documentation was completed with the aid of voice recognition software. Transcription may contain typographical errors.  Final Clinical Impressions(s) / UC Diagnoses   Final diagnoses:  Acute cough  Moderate persistent asthma with acute exacerbation  Acute URI  Discharge Instructions      You were seen today for cough, wheezing, and shortness of breath that have been getting worse over the past few days. Your flu and COVID tests were negative. A chest X-ray was performed and there was no evidence of pneumonia or any other abnormality noted. Based on your  symptoms and your history of asthma, it is likely that you are having an asthma flare-up, possibly triggered by a viral illness or bronchitis. We are treating this aggressively to try to prevent your breathing from worsening to the point that you would need oxygen  or hospital care.  You have been prescribed azithromycin and Augmentin , both antibiotics, as well as a prednisone  Dosepak to reduce airway inflammation. Please begin the prednisone  in the morning. Only take the steroids that I prescribed to you today. Do not take the steroid previously prescribed by your allergy specialist. I have already sent a cancellation notice to the pharmacy for that prescription, so be sure to pick up only the correct one.  You should also take Mucinex DM twice daily as prescribed to help loosen mucus. Promethazine DM may be taken every six hours as needed to help with your cough, and Tussionex may be used at bedtime to help you sleep more comfortably.  Continue using all of your usual allergy and asthma medications as directed. I strongly recommend that you get a pulse oximeter to check your oxygen  levels at home. Your oxygen  level should stay above 92%.   If your breathing gets worse, if your oxygen  level drops below 92%, or if you develop chest pain, confusion, high fever, or feel extremely weak or tired, go to the emergency department right away.  Make sure to follow up with your regular doctor as instructed for continued care and monitoring.   ED Prescriptions     Medication Sig Dispense Auth. Provider   amoxicillin -clavulanate (AUGMENTIN ) 875-125 MG tablet Take 1 tablet by mouth every 12 (twelve) hours. 14 tablet Maryruth Sol, FNP   azithromycin (ZITHROMAX) 500 MG tablet  (Status: Discontinued) Take 1 tablet (500 mg total) by mouth daily for 5 days. Take first 2 tablets together, then 1 every day until finished. 5 tablet Lorel Lembo, [place], FNP   Dextromethorphan-guaiFENesin (MUCINEX DM MAXIMUM STRENGTH)  60-1200 MG TB12 Take 1 tablet by mouth 2 (two) times daily. 20 tablet Icholas Irby, [place], FNP   predniSONE  (STERAPRED UNI-PAK 21 TAB) 10 MG (21) TBPK tablet Take by mouth daily. Take 6 tabs by mouth daily  for 2 days, then 5 tabs for 2 days, then 4 tabs for 2 days, then 3 tabs for 2 days, 2 tabs for 2 days, then 1 tab by mouth daily for 2 days START IN AM OF 09/19/23 42 tablet Gracielynn Birkel, [place], FNP   chlorpheniramine-HYDROcodone (TUSSIONEX) 10-8 MG/5ML Take 5 mLs by mouth at bedtime as needed for cough. 70 mL Maryruth Sol, FNP   promethazine-dextromethorphan (PROMETHAZINE-DM) 6.25-15 MG/5ML syrup Take 10 mLs by mouth every 6 (six) hours as needed for cough. 118 mL Deniesha Stenglein, FNP   azithromycin (ZITHROMAX) 500 MG tablet Take 1 tablet (500 mg total) by mouth daily for 5 days. 5 tablet Maryruth Sol, FNP      I have reviewed the PDMP during this encounter.   Maryruth Sol, [place] 09/18/23 1944

## 2023-09-18 NOTE — ED Triage Notes (Addendum)
 Pt c/o cough, sob for a few days. He would like to get chest x-ray. Has hx of asthma and has been using inhaler and nebulizer treatments at home  [facility] and given steroid shot

## 2023-09-18 NOTE — Discharge Instructions (Addendum)
 You were seen today for cough, wheezing, and shortness of breath that have been getting worse over the past few days. Your flu and COVID tests were negative. A chest X-ray was performed and there was no evidence of pneumonia or any other abnormality noted. Based on your symptoms and your history of asthma, it is likely that you are having an asthma flare-up, possibly triggered by a viral illness or bronchitis. We are treating this aggressively to try to prevent your breathing from worsening to the point that you would need oxygen  or hospital care.  You have been prescribed azithromycin and Augmentin , both antibiotics, as well as a prednisone  Dosepak to reduce airway inflammation. Please begin the prednisone  in the morning. Only take the steroids that I prescribed to you today. Do not take the steroid previously prescribed by your allergy specialist. I have already sent a cancellation notice to the pharmacy for that prescription, so be sure to pick up only the correct one.  You should also take Mucinex DM twice daily as prescribed to help loosen mucus. Promethazine DM may be taken every six hours as needed to help with your cough, and Tussionex may be used at bedtime to help you sleep more comfortably.  Continue using all of your usual allergy and asthma medications as directed. I strongly recommend that you get a pulse oximeter to check your oxygen  levels at home. Your oxygen  level should stay above 92%.   If your breathing gets worse, if your oxygen  level drops below 92%, or if you develop chest pain, confusion, high fever, or feel extremely weak or tired, go to the emergency department right away.  Make sure to follow up with your regular doctor as instructed for continued care and monitoring.

## 2023-09-22 DIAGNOSIS — M25521 Pain in right elbow: Secondary | ICD-10-CM | POA: Diagnosis not present

## 2023-09-23 DIAGNOSIS — H2511 Age-related nuclear cataract, right eye: Secondary | ICD-10-CM | POA: Diagnosis not present

## 2023-09-24 ENCOUNTER — Encounter (HOSPITAL_BASED_OUTPATIENT_CLINIC_OR_DEPARTMENT_OTHER): Payer: Self-pay | Admitting: Cardiovascular Disease

## 2023-09-24 ENCOUNTER — Telehealth: Payer: Self-pay | Admitting: Family

## 2023-09-24 DIAGNOSIS — I10 Essential (primary) hypertension: Secondary | ICD-10-CM | POA: Diagnosis not present

## 2023-09-24 DIAGNOSIS — I25118 Atherosclerotic heart disease of native coronary artery with other forms of angina pectoris: Secondary | ICD-10-CM | POA: Diagnosis not present

## 2023-09-24 DIAGNOSIS — I700 Atherosclerosis of aorta: Secondary | ICD-10-CM | POA: Diagnosis not present

## 2023-09-24 NOTE — Telephone Encounter (Signed)
 Called PT Arthur Moore, advised of FNP, Ambs recommendation to go to ED due to double dosage of steroids and change in behavior. He thanked for the information

## 2023-09-24 NOTE — Telephone Encounter (Signed)
 It looks as though we gave a steroid injection and oral steroid in this clinic on 09/18/23. At the UC that same day it looks as though he received more steroid and an antibiotic. He should definitely go to the ED for evaluation of change in behavior which could have many causes. Thank you

## 2023-09-24 NOTE — Telephone Encounter (Signed)
 Pt's spouse called to follow up on bronchial asthma appt w/Christie Eddie Good on 05/23, advised needed to go to Urgent Care for labs after receiving a steroid shot. They completed that visit same day and were ordered six (6) new Rxs. Believes that he is suffering reaction due to psychotic episodes and being in distress. Was hoping for review - will send back

## 2023-09-24 NOTE — Telephone Encounter (Signed)
 Noted. Agree with Rexene Catching.

## 2023-09-25 ENCOUNTER — Ambulatory Visit: Payer: Self-pay

## 2023-09-25 NOTE — Telephone Encounter (Signed)
 Please review and advise.

## 2023-09-26 ENCOUNTER — Other Ambulatory Visit: Payer: Self-pay

## 2023-09-26 ENCOUNTER — Encounter (HOSPITAL_BASED_OUTPATIENT_CLINIC_OR_DEPARTMENT_OTHER): Payer: Self-pay

## 2023-09-26 ENCOUNTER — Emergency Department (HOSPITAL_BASED_OUTPATIENT_CLINIC_OR_DEPARTMENT_OTHER)

## 2023-09-26 ENCOUNTER — Emergency Department (HOSPITAL_BASED_OUTPATIENT_CLINIC_OR_DEPARTMENT_OTHER)
Admission: EM | Admit: 2023-09-26 | Discharge: 2023-09-26 | Disposition: A | Discharge status: Home / Self Care | Attending: Emergency Medicine | Admitting: Emergency Medicine

## 2023-09-26 DIAGNOSIS — I129 Hypertensive chronic kidney disease with stage 1 through stage 4 chronic kidney disease, or unspecified chronic kidney disease: Secondary | ICD-10-CM | POA: Insufficient documentation

## 2023-09-26 DIAGNOSIS — J4541 Moderate persistent asthma with (acute) exacerbation: Secondary | ICD-10-CM

## 2023-09-26 DIAGNOSIS — Z7982 Long term (current) use of aspirin: Secondary | ICD-10-CM | POA: Insufficient documentation

## 2023-09-26 DIAGNOSIS — Z79899 Other long term (current) drug therapy: Secondary | ICD-10-CM | POA: Insufficient documentation

## 2023-09-26 DIAGNOSIS — I10 Essential (primary) hypertension: Secondary | ICD-10-CM | POA: Diagnosis not present

## 2023-09-26 DIAGNOSIS — I2510 Atherosclerotic heart disease of native coronary artery without angina pectoris: Secondary | ICD-10-CM | POA: Insufficient documentation

## 2023-09-26 DIAGNOSIS — R059 Cough, unspecified: Secondary | ICD-10-CM | POA: Diagnosis not present

## 2023-09-26 DIAGNOSIS — N182 Chronic kidney disease, stage 2 (mild): Secondary | ICD-10-CM | POA: Diagnosis not present

## 2023-09-26 DIAGNOSIS — R0602 Shortness of breath: Secondary | ICD-10-CM | POA: Diagnosis not present

## 2023-09-26 DIAGNOSIS — R079 Chest pain, unspecified: Secondary | ICD-10-CM | POA: Diagnosis not present

## 2023-09-26 DIAGNOSIS — I700 Atherosclerosis of aorta: Secondary | ICD-10-CM | POA: Diagnosis not present

## 2023-09-26 DIAGNOSIS — Z7951 Long term (current) use of inhaled steroids: Secondary | ICD-10-CM | POA: Insufficient documentation

## 2023-09-26 DIAGNOSIS — R062 Wheezing: Secondary | ICD-10-CM | POA: Diagnosis not present

## 2023-09-26 DIAGNOSIS — I25118 Atherosclerotic heart disease of native coronary artery with other forms of angina pectoris: Secondary | ICD-10-CM | POA: Diagnosis not present

## 2023-09-26 DIAGNOSIS — E7800 Pure hypercholesterolemia, unspecified: Secondary | ICD-10-CM | POA: Diagnosis not present

## 2023-09-26 DIAGNOSIS — F411 Generalized anxiety disorder: Secondary | ICD-10-CM | POA: Diagnosis not present

## 2023-09-26 NOTE — ED Triage Notes (Signed)
 Pt BIB EMS from home due to cough,sob for a few days. Pt reports he has been seen over the past week a couple of times due to his s/s. Per EMS they gave a duo-neb. Pt reports relief with the neb treatment.

## 2023-09-26 NOTE — ED Notes (Signed)
 Pt seen in room 5 by this RT. BLB clear and slighty dim, no wheezes noted. SATS 97% on room air.

## 2023-09-26 NOTE — ED Provider Notes (Signed)
 [facility] Provider Note  CSN: [id number] Arrival date & time: 09/26/23 5366  Chief Complaint(s) Shortness of Breath  HPI Arthur Moore is a 73 y.o. male with a past medical history listed below including asthma and seasonal allergies here for persistent cough with acute shortness of breath this morning following a coughing spell.  Asthma has been more consistent recently due to seasonal allergies this year not controlled with his typical allergy medicine, shots, etc.  Patient reports running out of albuterol  nebulizer at home and has been using budesonide  and his albuterol  inhaler with minimal relief.  Patient is already on steroid taper.  Denies any fevers.  Cough is nonproductive.  Reports feeling better after DuoNeb given to him by EMS and route.  EMS did note wheezing in the lungs prior to the DuoNeb.  The history is provided by the patient.    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   CAD (coronary artery disease) 07/09/2018   CKD (chronic kidney disease), 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   Chest pain of uncertain etiology 07/08/2018   Chronic cough 06/21/2018   Thrombocytopenia (HCC) 12/01/2016   Transaminitis 12/01/2016   Diastolic dysfunction 06/20/2016   MDD (major depressive disorder), recurrent severe, 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 Authorizing Provider  albuterol  (VENTOLIN  HFA) 108 (90 Base) MCG/ACT inhaler Inhale 2 puffs into the lungs every 4 (four) hours as needed for wheezing or shortness of breath. 07/07/23   Kozlow, Rema Care, MD  amoxicillin -clavulanate (AUGMENTIN ) 875-125 MG tablet Take 1 tablet by mouth every 12 (twelve) hours. 09/18/23   Maryruth Sol, FNP  aspirin  EC 81 MG tablet Take 81 mg by mouth daily.    [provider]  budesonide  (PULMICORT ) 0.25 MG/2ML nebulizer solution Take 2 mLs (0.25 mg total) by nebulization every 4 (four) hours as needed (During flare ups). 07/07/23   Kozlow, Rema Care, MD  budesonide -formoterol  (SYMBICORT ) 160-4.5 MCG/ACT inhaler Inhale 2 puffs into the lungs 2 (two) times daily. With spacer (empty lungs) 07/07/23   Kozlow, Rema Care, MD  cetirizine  (ZYRTEC  ALLERGY) 10 MG tablet Take 1 tablet (10 mg total) by mouth daily as needed (can take an extra dose during flare ups). 07/07/23   Kozlow, Rema Care, MD  chlorpheniramine-HYDROcodone (TUSSIONEX) 10-8 MG/5ML Take 5 mLs by mouth at bedtime as needed for cough. 09/18/23   Maryruth Sol, FNP  Dextromethorphan-guaiFENesin  (MUCINEX  DM MAXIMUM STRENGTH) 60-1200 MG TB12 Take 1 tablet by mouth 2 (  two) times daily. 09/18/23   Murrill, Samantha, FNP  fluticasone  (FLONASE ) 50 MCG/ACT nasal spray Place 2 sprays into both nostrils daily. 1-2 sprays each nostril 1 (ONE) time per day. 03/31/23   Kozlow, Rema Care, MD  lamoTRIgine  (LAMICTAL ) 200 MG tablet Take 1 tablet (200 mg total) by mouth 2 (two) times daily. 02/05/22   Mozingo, Regina Nattalie, NP  metoprolol  tartrate (LOPRESSOR ) 25 MG tablet TAKE  1/2 TABLET BY MOUTH TWICE A DAY 07/21/22   Maudine Sos, MD  nitroGLYCERIN  (NITROSTAT ) 0.4 MG SL tablet Place 1 tablet (0.4 mg total) under the tongue every 5 (five) minutes as needed for chest pain. 07/11/22 03/31/23  Clearnce Curia, NP  pantoprazole  (PROTONIX ) 40 MG tablet Take 1 tablet (40 mg total) by mouth 2 (two) times daily. 07/07/23   Kozlow, Rema Care, MD  predniSONE  (STERAPRED UNI-PAK 21 TAB) 10 MG (21) TBPK tablet Take by mouth daily. Take 6 tabs by mouth daily  for 2 days, then 5 tabs for 2 days, then 4 tabs for 2 days, then 3 tabs for 2 days, 2 tabs for 2 days, then 1 tab by mouth daily for 2 days START IN AM OF 09/19/23 09/18/23   Maryruth Sol, FNP  promethazine -dextromethorphan (PROMETHAZINE -DM) 6.25-15 MG/5ML syrup Take 10 mLs by mouth every 6 (six) hours as needed for cough. 09/18/23   Maryruth Sol, FNP  Respiratory Therapy Supplies (NEBULIZER MASK ADULT) MISC 1 Device by Does not apply route as directed. 07/07/23   Kozlow, Rema Care, MD  rosuvastatin  (CRESTOR ) 10 MG tablet Take 1 tablet by mouth daily.    [provider]  sertraline (ZOLOFT) 50 MG tablet Take 100 mg by mouth every morning. 05/26/23   [provider]  Spacer/Aero-Holding Idelle Majors DEVI 1 Device by Does not apply route as directed. 07/07/23   Kozlow, Rema Care, MD  UNABLE TO FIND Inject 0.5 mLs into the skin once a week.    [provider]  vortioxetine HBr (TRINTELLIX) 5 MG TABS tablet Take by mouth. 08/05/23   [provider]                                                                                                                                    Allergies Patient has no known allergies.  Review of Systems Review of Systems As noted in HPI  Physical Exam Vital Signs  I have reviewed the triage vital signs BP 137/83 (BP Location: Right Arm)   Pulse 64   Temp 98 F (36.7 C) (Oral)   Resp 14   SpO2 93%   Physical Exam Vitals reviewed.  Constitutional:       General: He is not in acute distress.    Appearance: He is well-developed. He is not diaphoretic.  HENT:     Head: Normocephalic and atraumatic.     Nose: Nose normal.  Eyes:     General: No scleral icterus.  Right eye: No discharge.        Left eye: No discharge.     Conjunctiva/sclera: Conjunctivae normal.     Pupils: Pupils are equal, round, and reactive to light.  Cardiovascular:     Rate and Rhythm: Normal rate and regular rhythm.     Heart sounds: No murmur heard.    No friction rub. No gallop.  Pulmonary:     Effort: Pulmonary effort is normal. No respiratory distress.     Breath sounds: Normal breath sounds. No stridor or decreased air movement. No wheezing, rhonchi or rales.  Abdominal:     General: There is no distension.     Palpations: Abdomen is soft.     Tenderness: There is no abdominal tenderness.  Musculoskeletal:        General: No tenderness.     Cervical back: Normal range of motion and neck supple.  Skin:    General: Skin is warm and dry.     Findings: No erythema or rash.  Neurological:     Mental Status: He is alert and oriented to person, place, and time.     ED Results and Treatments Labs (all labs ordered are listed, but only abnormal results are displayed) Labs Reviewed - No data to display                                                                                                                       EKG  EKG Interpretation Date/Time:    Ventricular Rate:    PR Interval:    QRS Duration:    QT Interval:    QTC Calculation:   R Axis:      Text Interpretation:         Radiology DG Chest Port 1 View Result Date: 09/26/2023 CLINICAL DATA:  73 year old male with cough and shortness of breath over the past week. EXAM: PORTABLE CHEST 1 VIEW COMPARISON:  Chest radiographs 09/18/2023 and earlier. FINDINGS: Portable AP upright view at 0555 hours. Lung volumes and mediastinal contours remain normal. Visualized tracheal air column is  within normal limits. Lung markings are within normal limits, Allowing for portable technique the lungs are clear. No pneumothorax or pleural effusion. No acute osseous abnormality identified. IMPRESSION: No acute cardiopulmonary abnormality. Electronically Signed   By: Marlise Simpers M.D.   On: 09/26/2023 06:05    Medications Ordered in ED Medications - No data to display Procedures Procedures  (including critical care time) Medical Decision Making / ED Course   Medical Decision Making Amount and/or Complexity of Data Reviewed Radiology: ordered and independent interpretation performed. Decision-making details documented in ED Course.  Risk Decision regarding hospitalization.    Shortness of breath Differential diagnosis considered Most consistent with asthma exacerbation.  Currently lungs are clear to auscultation bilaterally with good air movement. Chest x-ray without evidence of pneumonia, pneumothorax, pulmonary edema pleural effusion. Doubt pulmonary embolism. Patient monitored for 2.5 hrs w/o need for additional intervention.    Final Clinical Impression(s) /  ED Diagnoses Final diagnoses:  Moderate persistent asthma with exacerbation, acute on chronic   The patient appears reasonably screened and/or stabilized for discharge and I doubt any other medical condition or other [facility] requiring further screening, evaluation, or treatment in the ED at this time. I have discussed the findings, Dx and Tx plan with the patient/family who expressed understanding and agree(s) with the plan. Discharge instructions discussed at length. The patient/family was given strict return precautions who verbalized understanding of the instructions. No further questions at time of discharge.  Disposition: Discharge  Condition: Good  ED Discharge Orders     None         Follow Up: Mordechai April, DO [address] [place] [address] [phone]  Call  to schedule an appointment for  close follow up     This chart was dictated using voice recognition software.  Despite best efforts to proofread,  errors can occur which can change the documentation meaning.    Lindle Rhea, MD 09/26/23 [phone]

## 2023-10-01 ENCOUNTER — Ambulatory Visit (INDEPENDENT_AMBULATORY_CARE_PROVIDER_SITE_OTHER): Payer: Medicare Other | Admitting: Cardiovascular Disease

## 2023-10-01 ENCOUNTER — Encounter (HOSPITAL_BASED_OUTPATIENT_CLINIC_OR_DEPARTMENT_OTHER): Payer: Self-pay | Admitting: Cardiovascular Disease

## 2023-10-01 VITALS — BP 110/60 | HR 76 | Ht 72.0 in | Wt 184.6 lb

## 2023-10-01 DIAGNOSIS — M25521 Pain in right elbow: Secondary | ICD-10-CM | POA: Diagnosis not present

## 2023-10-01 DIAGNOSIS — I2510 Atherosclerotic heart disease of native coronary artery without angina pectoris: Secondary | ICD-10-CM

## 2023-10-01 DIAGNOSIS — I351 Nonrheumatic aortic (valve) insufficiency: Secondary | ICD-10-CM | POA: Diagnosis not present

## 2023-10-01 DIAGNOSIS — I10 Essential (primary) hypertension: Secondary | ICD-10-CM | POA: Diagnosis not present

## 2023-10-01 DIAGNOSIS — I7121 Aneurysm of the ascending aorta, without rupture: Secondary | ICD-10-CM

## 2023-10-01 NOTE — Progress Notes (Signed)
 Cardiology Office Note:  .   Date:  10/01/2023  ID:  Kennard Fildes, DOB [birth year], MRN [id number] PCP: Mordechai April, DO   [facility]Care Providers Cardiologist:  Maudine Sos, MD    History of Present Illness: .    Arthur Moore is a 73 y.o. male with non-obstructive CAD, mild-moderate aortic insufficiency, hypertension, hyperlipidemia, asthma, TIA, OSA not on CPAP, liver fibrosis, anxiety, and depression who presents for follow up.  Mr. Mcburney wore a Holter monitor 06/2013 that revealed rare PACs and PVCs.  He previously followed up with a cardiologist in [place]  due to mild AR.  He subsequently had an echo at the [place] in 2015 that reported it as mild-moderate.  He had a repeat echocardiogram 12/05/15 revealed LVEF 55-60% with grade 2 diastolic. Aortic regurgitation remained mild to moderate.  Mr. Poitra reported fatigue and atypical chest pain.  He was referred for an exercise Myoview  3/18 that revealed LVEF 49% and asynchronous contraction. There was a small defect in the basal inferior region that was thought to be diaphragmatic attenuation.  He achieved 7 METS on a Bruce protocol.  He called our office after these results due to persistent chest pain and it was suggested that he follow up with GI.  He was subsequently diagnosed with diverticulitis.   Mr. Wacha saw Armandina Bernard, PA-C, for chest pain.  He had previously been seen in the ED with chest pain.  Cardiac enzymes and CT of the chest, abdomen, and pelvis were unremarkable.  His chest pain was atypical and occurred when he was trying to lie down in bed at night.  He was referred for coronary CT-A 04/2017 that revealed a coronary calcium  score 361 which is 78th percentile.  He had extensive calcified plaque in the proximal LAD with possible moderate stenosis.  FFR was negative.  Repeat echo 08/2017 revealed LVEF 55 to 65% with grade 1 diastolic dysfunction.  He was seen in the ED 06/2018 with chest pain.  Cardiac enzymes  were negative.  He underwent cardiac catheterization 06/2018 which revealed 50% stenosis in D1 and otherwise 10 to 20% stenoses in the distal RCA, proximal left circumflex, distal left main, and proximal LAD.  Mr. Chopin was started on Vascepa  for hypertriglyceridemia and CAD.  It worked well for his triglycerides.  However he was unable to afford it.  He tried to get it filled through the [place] and was denied.  Metoprolol  succinate was also denied as were PCSK9 inhibitors.  Mr. Rinkenberger was seen by Lawana Pray on 04/2019 after a visit to the ED for chest pain.  His ischemia evaluation was unremarkable.  His chest pain was worse with inhalation.  He had no exertional chest pain.  No further work-up was performed.    He had blood work at the [place] and the numbers were excellent.  His lipids were all at goal. He reported some LE heaviness that he attributed to deconditioning. He was seen in the ED 03/2021 after a head injury while playing pickleball. In 10/2021 he followed up with Neomi Banks, NP for preoperative clearance. He underwent right medial meniscus repair performed by Dr. Hiram Lukes in [facility] 11/2021.  He called the office 05/2022 complaining of shortness of breath for 1 month.  He had a coronary CTA 06/2022 that revealed moderate stenosis of the LAD and mild disease in the RCA and left circumflex.  FFR testing was consistent with nonobstructive disease.  He followed up with Neomi Banks, NP 06/2022 and noted that his breathing  was better.  Symptoms improved with use of inhalers and nebulizer.  He was referred to the PREP program at the [facility].  At his appointment 12/2022 he was struggling with anxiety but doing well from a cardiac standpoint.  Repeat echo  06/2023 revealed moderate AR and normal systolic function.   Discussed the use of AI scribe software for clinical note transcription with the patient, who gave verbal consent to proceed.  History of Present Illness Arthur Moore is a 73 year old  male with asthma who presents with worsening respiratory symptoms.  Over the past four weeks, he has experienced a significant worsening of asthma symptoms, describing the period as 'awful'. His cough is starting to loosen up. Despite using nebulized albuterol  and budesonide , an emergency inhaler, and Symbicort  for maintenance, he required two emergency room visits and two urgent care visits. He received three steroid shots and two courses of oral steroids, which he feels have been excessive. He has been monitoring his oxygen  levels at home, which have remained between 94% and 97%.  He has a history of receiving allergy shots, which were effective for nine years until this current allergy season, which he believes has triggered his asthma and led to bronchial spasms. He is concerned about the impact of steroids on his body, noting that he feels 'steroided out'.  He has a history of tendonitis in his elbow, for which he received two steroid injections, and a significant tear in his tendon was recently diagnosed. He also received a bursa shot. He mentions gaining weight due to inactivity and steroid use and is unable to play pickleball due to his elbow injury.  He has a history of elevated liver function tests, with recent levels mildly elevated at 38 and 36. He is under the care of a liver specialist. He reports that his triglyceride test may have been non-fasting. His LDL is 58, and he is monitoring his blood pressure with a device that sends results to his doctor's office. He reports a recent reading of 110/60.  He has a history of a meniscus tear and bursitis, which have impacted his ability to exercise.   ROS:  As per HPI  Studies Reviewed: .       Echo 07/13/2023:   IMPRESSIONS   1. Left ventricular ejection fraction, by estimation, is 50 to 55%. The  left ventricle has low normal function. The left ventricle has no regional  wall motion abnormalities. Left ventricular diastolic parameters  are  consistent with Grade I diastolic  dysfunction (impaired relaxation). The average left ventricular global  longitudinal strain is 14.7 %.   2. Right ventricular systolic function is normal. The right ventricular  size is normal. Tricuspid regurgitation signal is inadequate for assessing  PA pressure.   3. The mitral valve is normal in structure. No evidence of mitral valve  regurgitation.   4. Eccentric jet towards the anterior mitral valve leaflet. The aortic  valve is normal in structure. Aortic valve regurgitation is moderate.   5. The inferior vena cava is normal in size with greater than 50%  respiratory variability, suggesting right atrial pressure of 3 mmHg.   09/15/23: Total cholesterol 116, triglycerides 209, HDL 26.5, LDL 58 Sodium 143, potassium 4.3, BUN 21, creatinine 1.24 AST 38, ALT 38  Risk Assessment/Calculations:             Physical Exam:   VS:  BP 110/60 (BP Location: Left Arm, Patient Position: Sitting, Cuff Size: Normal)   Pulse 76  Ht 6' (1.829 m)   Wt 184 lb 9.6 oz (83.7 kg)   BMI 25.04 kg/m  , BMI Body mass index is 25.04 kg/m. GENERAL:  Well appearing HEENT: Pupils equal round and reactive, fundi not visualized, oral mucosa unremarkable NECK:  No jugular venous distention, waveform within normal limits, carotid upstroke brisk and symmetric, no bruits, no thyromegaly LUNGS:  Clear to auscultation bilaterally HEART:  RRR.  PMI not displaced or sustained,S1 and S2 within normal limits, no S3, no S4, no clicks, no rubs, no murmurs ABD:  Flat, positive bowel sounds normal in frequency in pitch, no bruits, no rebound, no guarding, no midline pulsatile mass, no hepatomegaly, no splenomegaly EXT:  2 plus pulses throughout, no edema, no cyanosis no clubbing SKIN:  No rashes no nodules NEURO:  Cranial nerves II through XII grossly intact, motor grossly intact throughout PSYCH:  Cognitively intact, oriented to person place and time   ASSESSMENT AND PLAN:  .    Assessment & Plan # Moderate aortic regurgitation:  Chronic moderate heart valve leak, well-managed. Blood pressure control crucial to prevent worsening.  He has no heart failure symptoms.  - Repeat echocardiogram in September. - Maintain blood pressure control.  # Asthma exacerbation Asthma exacerbation likely due to seasonal allergies. Symptoms improving with current treatment. Extensive steroid use noted but not for chronic use. - Continue nebulizing albuterol  and budesonide , emergency inhaler, and Symbicort . - Monitor oxygen  levels to remain above 88%. - Contact Dr. Eugune Hews for cough medicine refill if needed.  # Elevated liver function tests Mildly elevated LFTs at 38 and 36. Under liver specialist monitoring.  # CAD:  # Hyperlipidemia:  # Hypertriglyceridemia No-obstructive disease. He has no ischemic symptoms.  Triglycerides slightly elevated, likely due to non-fasting state. LDL well-controlled at 58.   Follow-up: 6 months    Signed, Maudine Sos, MD

## 2023-10-01 NOTE — Patient Instructions (Signed)
 Medication Instructions:  Your physician recommends that you continue on your current medications as directed. Please refer to the Current Medication list given to you today.   *If you need a refill on your cardiac medications before your next appointment, please call your pharmacy*  Lab Work: NONE  Testing/Procedures: Your physician has requested that you have an echocardiogram. Echocardiography is a painless test that uses sound waves to create images of your heart. It provides your doctor with information about the size and shape of your heart and how well your heart's chambers and valves are working. This procedure takes approximately one hour. There are no restrictions for this procedure. Please do NOT wear cologne, perfume, aftershave, or lotions (deodorant is allowed). Please arrive 15 minutes prior to your appointment time.  Please note: We ask at that you not bring children with you during ultrasound (echo/ vascular) testing. Due to room size and safety concerns, children are not allowed in the ultrasound rooms during exams. Our front office staff cannot provide observation of children in our lobby area while testing is being conducted. An adult accompanying a patient to their appointment will only be allowed in the ultrasound room at the discretion of the ultrasound technician under special circumstances. We apologize for any inconvenience.  TO BE DONE IN SEPTEMBER   Follow-Up: At [facility], you and your health needs are our priority.  As part of our continuing mission to provide you with exceptional heart care, our providers are all part of one team.  This team includes your primary Cardiologist (physician) and Advanced Practice Providers or APPs (Physician Assistants and Nurse Practitioners) who all work together to provide you with the care you need, when you need it.  Your next appointment:   6 month(s)  Provider:   Maudine Sos, MD, Slater Duncan, NP, or  Neomi Banks, NP   We recommend signing up for the patient portal called "MyChart".  Sign up information is provided on this After Visit Summary.  MyChart is used to connect with patients for Virtual Visits (Telemedicine).  Patients are able to view lab/test results, encounter notes, upcoming appointments, etc.  Non-urgent messages can be sent to your provider as well.   To learn more about what you can do with MyChart, go to [email or web address].

## 2023-10-02 ENCOUNTER — Institutional Professional Consult (permissible substitution) (INDEPENDENT_AMBULATORY_CARE_PROVIDER_SITE_OTHER): Admitting: Otolaryngology

## 2023-10-02 ENCOUNTER — Encounter: Payer: Medicare Other | Admitting: Psychology

## 2023-10-05 ENCOUNTER — Other Ambulatory Visit: Payer: Self-pay

## 2023-10-05 ENCOUNTER — Encounter: Payer: Self-pay | Admitting: Physician Assistant

## 2023-10-05 ENCOUNTER — Ambulatory Visit
Admission: RE | Admit: 2023-10-05 | Discharge: 2023-10-05 | Disposition: A | Discharge status: Home / Self Care | Source: Ambulatory Visit | Attending: Physician Assistant | Admitting: Physician Assistant

## 2023-10-05 VITALS — BP 121/72 | HR 71 | Temp 98.6°F | Resp 16 | Ht 72.0 in | Wt 184.0 lb

## 2023-10-05 DIAGNOSIS — K5900 Constipation, unspecified: Secondary | ICD-10-CM

## 2023-10-05 DIAGNOSIS — R1013 Epigastric pain: Secondary | ICD-10-CM | POA: Diagnosis not present

## 2023-10-05 NOTE — ED Notes (Signed)
 Pt given instructions on returning stool sample to clinic for send-off testing. Verbalized understanding.

## 2023-10-05 NOTE — Discharge Instructions (Addendum)
 You were seen today for concerns of abdominal pain and constipation Given your recent antibiotic use and previous history we decided to test you for bacterial infection as well as C.Dif Please complete the stool sample collection as directed and bring back to the clinic in 24 hours  To help with your constipation I recommend the following:  .  Starting a stool softener such as Dulcolax Starting a laxative such as MiraLAX. You can also try to use Miralax daily to retrain your bowels until you are having regular bowel movements. Take the recommended dose once daily then gradually reduce the dose until you are having regular daily bowel movements that are comfortable  I recommend increasing your daily fiber intake- do this gradually to prevent bloating and abdominal discomfort. Try to get about 5 grams per day the first week or so then increase by 5 grams each week until you feel like you are having comfortable bowel movements Please also make sure that you are staying well-hydratedat least 75 ounces of water intake per day. We will keep you updated on the results of your stool testing once those results are available.  If any medications are indicated by those results they will be sent into the pharmacy that we have on file. If your abdominal pain becomes more severe, and is accompanied by fever or chills, severe nausea and vomiting, you are unable to eat or drink please go to the emergency room as these could be signs of medical emergency.

## 2023-10-05 NOTE — ED Triage Notes (Addendum)
 Pt presents with complaints of generalized abdominal pain x 4 days. Pt rates his overall discomfort a 3/10. Describes as aching and dull. Pt states he has been nauseous and constipated. Last BM was this morning, 6/9. Over the past few days, stools have been marble-like and hard.

## 2023-10-05 NOTE — ED Provider Notes (Signed)
 [facility]    CSN: [id number] Arrival date & time: 10/05/23  1158      History   Chief Complaint Chief Complaint  Patient presents with   Abdominal Pain    Stomach has constantly been upset - Entered by patient    HPI Arthur Moore is a 72 y.o. male.   HPI  Pt reports concerns for abdominal discomfort and pain  He reports having a constant ache and feels like his insides are "being shredded"  He states the last time he felt like this he had salmonella or c. Dif He has been taking a probiotic daily   He reports having constipation predominantly as well as generalized abdominal pain  He denies blood in stool He reports that he is passing gas but not as much as he was last week ( they have tried GasX to help with symptoms)  He is urinating frequently and has increased fluid intake Interventions: IB-gard (OTC peppermint oil), GasX He is trying to eat softer foods as well   He was seen in UC on 09/18/23 and given Augmentin  and Zpack   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   Chest pain of uncertain etiology 07/08/2018   Chronic cough 06/21/2018   Thrombocytopenia (HCC) 12/01/2016   Transaminitis 12/01/2016   Diastolic dysfunction 06/20/2016   MDD (major depressive disorder), recurrent severe, 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   Hyperlipidemia 08/24/2015   Arthritis    Depression     Hypertension    Aortic insufficiency    Psoriasis    Cervical disc disease    Lumbar disc disease    Asthma 01/06/2015   Psoriatic arthritis (HCC) 01/06/2015   Allergic rhinoconjunctivitis 01/06/2015   GERD (gastroesophageal reflux disease) 01/06/2015   Laryngopharyngeal reflux (LPR) 01/06/2015    Past Surgical History:  Procedure Laterality Date   CHOLECYSTECTOMY  2012   HERNIA REPAIR     KNEE ARTHROSCOPY W/ MENISCAL REPAIR  11/2021   LEFT HEART CATH AND CORONARY ANGIOGRAPHY N/A 07/08/2018   Procedure: LEFT HEART CATH AND CORONARY ANGIOGRAPHY;  Surgeon: Odie Benne, MD;  Location: [facility];  Service: Cardiovascular;  Laterality: N/A;   PAROTIDECTOMY Right 1992   PROSTATE SURGERY  2002, 2004   SINOSCOPY     SINUS EXPLORATION  2013   SPINE SURGERY  2013   L C7-T1 laminectomy and discectomy   TONSILLECTOMY         Home Medications    Prior to Admission medications   Medication Sig Start Date End Date Taking? Authorizing Provider  albuterol  (VENTOLIN  HFA) 108 (90 Base) MCG/ACT inhaler Inhale 2 puffs into the lungs every 4 (four) hours as needed for wheezing or shortness of breath. 07/07/23   Kozlow, Rema Care, MD  aspirin  EC 81 MG tablet Take 81 mg by mouth daily.    [provider]  budesonide  (PULMICORT ) 0.25 MG/2ML nebulizer solution Take 2 mLs (0.25 mg total) by nebulization every 4 (four) hours as needed (During flare ups). 07/07/23   Kozlow, Rema Care, MD  budesonide -formoterol  (SYMBICORT ) 160-4.5 MCG/ACT inhaler Inhale 2 puffs into the lungs 2 (two) times daily. With spacer (empty lungs) 07/07/23   Kozlow, Rema Care, MD  cetirizine  (ZYRTEC  ALLERGY) 10 MG tablet Take 1 tablet (10 mg total) by mouth daily as needed (can take an extra dose during flare ups). 07/07/23   Kozlow, Rema Care, MD  chlorpheniramine-HYDROcodone (TUSSIONEX) 10-8 MG/5ML Take 5 mLs by mouth at bedtime as needed for cough. 09/18/23   Maryruth Sol, FNP  Dextromethorphan-guaiFENesin   (MUCINEX  DM MAXIMUM STRENGTH) 60-1200 MG TB12 Take 1 tablet by mouth 2 (two) times daily. 09/18/23   Maryruth Sol, FNP  fluticasone  (FLONASE ) 50 MCG/ACT nasal spray Place 2 sprays into both nostrils daily. 1-2 sprays each nostril 1 (ONE) time per day. 03/31/23   Kozlow, Rema Care, MD  hydrOXYzine  (ATARAX ) 50 MG tablet Take 50 mg by mouth 3 (three) times daily as needed for anxiety (PTSD).    [provider]  lamoTRIgine  (LAMICTAL ) 200 MG tablet Take 1 tablet (200 mg total) by mouth 2 (two) times daily. 02/05/22   Mozingo, Regina Nattalie, NP  metoprolol  tartrate (LOPRESSOR ) 25 MG tablet TAKE 1/2 TABLET BY MOUTH TWICE A DAY 07/21/22   Maudine Sos, MD  nitroGLYCERIN  (NITROSTAT ) 0.4 MG SL tablet Place 1 tablet (0.4 mg total) under the tongue every 5 (five) minutes as needed for chest pain. 07/11/22 03/31/23  Clearnce Curia, NP  pantoprazole  (PROTONIX ) 40 MG tablet Take 1 tablet (40 mg total) by mouth 2 (two) times daily. 07/07/23   Kozlow, Rema Care, MD  Respiratory Therapy Supplies (NEBULIZER MASK ADULT) MISC 1 Device by Does not apply route as directed. 07/07/23   Kozlow, Rema Care, MD  rosuvastatin  (CRESTOR ) 10 MG tablet Take 1 tablet by mouth daily.    [provider]  Spacer/Aero-Holding Chambers DEVI 1 Device by Does not apply route as directed. 07/07/23   Kozlow, Rema Care, MD  vortioxetine HBr (TRINTELLIX) 5 MG TABS tablet Take by mouth. 08/05/23   [provider]    Family History Family History  Problem Relation Age of Onset   Colon cancer Mother    Lung cancer Father    Bipolar disorder Brother    Cancer Other    Allergic rhinitis Neg Hx    Angioedema Neg Hx    Asthma Neg Hx    Eczema Neg Hx     Social History Social History   Tobacco Use   Smoking status: Never   Smokeless tobacco: Never  Vaping Use   Vaping status: Never Used  Substance Use Topics   Alcohol use: Not Currently   Drug use: No     Allergies   Patient has no known  allergies.   Review of Systems Review of Systems  Constitutional:  Negative for chills and fever.  Gastrointestinal:  Positive for abdominal pain, constipation and nausea. Negative for blood in stool, diarrhea and vomiting.     Physical Exam Triage Vital Signs ED Triage Vitals  Encounter Vitals Group     BP 10/05/23 1223 121/72     Systolic BP Percentile --      Diastolic BP Percentile --      Pulse Rate 10/05/23 1223 71     Resp 10/05/23 1223 16     Temp 10/05/23 1223 98.6 F (37 C)  Temp Source 10/05/23 1223 Oral     SpO2 10/05/23 1223 94 %     Weight 10/05/23 1223 184 lb (83.5 kg)     Height 10/05/23 1223 6' (1.829 m)     Head Circumference --      Peak Flow --      Pain Score 10/05/23 1229 3     Pain Loc --      Pain Education --      Exclude from Growth Chart --    No data found.  Updated Vital Signs BP 121/72 (BP Location: Right Arm)   Pulse 71   Temp 98.6 F (37 C) (Oral)   Resp 16   Ht 6' (1.829 m)   Wt 184 lb (83.5 kg)   SpO2 94%   BMI 24.95 kg/m   Visual Acuity Right Eye Distance:   Left Eye Distance:   Bilateral Distance:    Right Eye Near:   Left Eye Near:    Bilateral Near:     Physical Exam Vitals reviewed.  Constitutional:      General: He is awake. He is not in acute distress.    Appearance: Normal appearance. He is well-developed and well-groomed. He is not ill-appearing or toxic-appearing.  HENT:     Head: Normocephalic and atraumatic.  Cardiovascular:     Rate and Rhythm: Normal rate and regular rhythm.     Pulses: Normal pulses.          Radial pulses are 2+ on the right side and 2+ on the left side.     Heart sounds: Normal heart sounds. No murmur heard.    No friction rub. No gallop.  Pulmonary:     Effort: Pulmonary effort is normal.     Breath sounds: Normal breath sounds. No decreased air movement. No decreased breath sounds, wheezing, rhonchi or rales.  Abdominal:     General: Abdomen is flat. Bowel sounds are  normal. There is no distension.     Palpations: Abdomen is soft.     Tenderness: There is generalized abdominal tenderness and tenderness in the epigastric area. There is no guarding or rebound. Positive signs include Murphy's sign. Negative signs include Rovsing's sign and McBurney's sign.     Comments: Patient reports pain with right upper quadrant palpation.  Abigail Abler sign is positive but patient reports that he has had gallbladder removed and states that the pain is similar to generalized abdominal tenderness.  Musculoskeletal:     Cervical back: Normal range of motion.     Right lower leg: No edema.     Left lower leg: No edema.  Neurological:     General: No focal deficit present.     Mental Status: He is alert and oriented to person, place, and time. Mental status is at baseline.     GCS: GCS eye subscore is 4. GCS verbal subscore is 5. GCS motor subscore is 6.  Psychiatric:        Attention and Perception: Attention and perception normal.        Mood and Affect: Mood and affect normal.        Speech: Speech normal.        Behavior: Behavior normal. Behavior is cooperative.        Thought Content: Thought content normal.        Cognition and Memory: Cognition normal.      UC Treatments / Results  Labs (all labs ordered are listed, but only abnormal results are displayed)  Labs Reviewed  GASTROINTESTINAL PANEL BY PCR, STOOL (REPLACES STOOL CULTURE)    EKG   Radiology No results found.  Procedures Procedures (including critical care time)  Medications Ordered in UC Medications - No data to display  Initial Impression / Assessment and Plan / UC Course  I have reviewed the triage vital signs and the nursing notes.  Pertinent labs & imaging results that were available during my care of the patient were reviewed by me and considered in my medical decision making (see chart for details).      Final Clinical Impressions(s) / UC Diagnoses   Final diagnoses:  Epigastric  pain  Constipation, unspecified constipation type   Patient presents today with concerns of generalized abdominal pain as well as constipation.  He reports this has been ongoing for about 4 days.  He states that his abdominal pain is similar to when he was diagnosed with bacterial infection but he cannot member if this was Salmonella or C. difficile.  He reports minimal improvement with home measures.  Given previous history will get stool sample for rule out.  Patient was previously on 2 antibiotics which slightly raises my concern for C. difficile.  At this time suspect symptoms are largely secondary to constipation.  Recommend a trial of stool softeners as well as MiraLAX to assist with symptoms.  Stool testing dictate further management.  ED and return precautions reviewed and provided in after visit summary.  Follow-up as needed    Discharge Instructions      You were seen today for concerns of abdominal pain and constipation Given your recent antibiotic use and previous history we decided to test you for bacterial infection as well as C.Dif Please complete the stool sample collection as directed and bring back to the clinic in 24 hours  To help with your constipation I recommend the following:  .  Starting a stool softener such as Dulcolax Starting a laxative such as MiraLAX. You can also try to use Miralax daily to retrain your bowels until you are having regular bowel movements. Take the recommended dose once daily then gradually reduce the dose until you are having regular daily bowel movements that are comfortable  I recommend increasing your daily fiber intake- do this gradually to prevent bloating and abdominal discomfort. Try to get about 5 grams per day the first week or so then increase by 5 grams each week until you feel like you are having comfortable bowel movements Please also make sure that you are staying well-hydratedat least 75 ounces of water intake per day. We will keep  you updated on the results of your stool testing once those results are available.  If any medications are indicated by those results they will be sent into the pharmacy that we have on file. If your abdominal pain becomes more severe, and is accompanied by fever or chills, severe nausea and vomiting, you are unable to eat or drink please go to the emergency room as these could be signs of medical emergency.   ED Prescriptions   None    PDMP not reviewed this encounter.   Jerona Mooring, PA-C 10/05/23 1954

## 2023-10-06 ENCOUNTER — Other Ambulatory Visit (HOSPITAL_COMMUNITY)
Admission: RE | Admit: 2023-10-06 | Discharge: 2023-10-06 | Disposition: A | Discharge status: Home / Self Care | Attending: Family Medicine | Admitting: Family Medicine

## 2023-10-06 ENCOUNTER — Other Ambulatory Visit (HOSPITAL_COMMUNITY): Admission: RE | Admit: 2023-10-06 | Source: Home / Self Care

## 2023-10-06 DIAGNOSIS — Z8619 Personal history of other infectious and parasitic diseases: Secondary | ICD-10-CM | POA: Insufficient documentation

## 2023-10-06 DIAGNOSIS — K5900 Constipation, unspecified: Secondary | ICD-10-CM | POA: Diagnosis not present

## 2023-10-06 DIAGNOSIS — R1013 Epigastric pain: Secondary | ICD-10-CM | POA: Insufficient documentation

## 2023-10-07 LAB — GASTROINTESTINAL PANEL BY PCR, STOOL (REPLACES STOOL CULTURE)

## 2023-10-08 ENCOUNTER — Ambulatory Visit (INDEPENDENT_AMBULATORY_CARE_PROVIDER_SITE_OTHER): Payer: Self-pay

## 2023-10-08 DIAGNOSIS — J309 Allergic rhinitis, unspecified: Secondary | ICD-10-CM | POA: Diagnosis not present

## 2023-10-12 DIAGNOSIS — M25512 Pain in left shoulder: Secondary | ICD-10-CM | POA: Diagnosis not present

## 2023-10-14 ENCOUNTER — Ambulatory Visit (INDEPENDENT_AMBULATORY_CARE_PROVIDER_SITE_OTHER): Admitting: Psychology

## 2023-10-14 ENCOUNTER — Ambulatory Visit: Payer: Self-pay

## 2023-10-14 DIAGNOSIS — R419 Unspecified symptoms and signs involving cognitive functions and awareness: Secondary | ICD-10-CM | POA: Diagnosis not present

## 2023-10-14 DIAGNOSIS — R4189 Other symptoms and signs involving cognitive functions and awareness: Secondary | ICD-10-CM
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
## 2023-10-14 NOTE — Progress Notes (Signed)
   Psychometrician Note   Cognitive testing was administered to Arthur Moore by Abbott Abbot, B.S. (psychometrist) under the supervision of Dr. Janice Meeter, Psy.D., licensed psychologist on 10/14/2023. Arthur Moore did not appear overtly distressed by the testing session per behavioral observation or responses across self-report questionnaires. Rest breaks were offered.    The battery of tests administered was selected by Dr. Janice Meeter, Psy.D. with consideration to Arthur Moore current level of functioning, the nature of his symptoms, emotional and behavioral responses during interview, level of literacy, observed level of motivation/effort, and the nature of the referral question. This battery was communicated to the psychometrist. Communication between Dr. Janice Meeter, Psy.D. and the psychometrist was ongoing throughout the evaluation and Dr. Janice Meeter, Psy.D. was immediately accessible at all times. Dr. Janice Meeter, Psy.D. provided supervision to the psychometrist on the date of this service to the extent necessary to assure the quality of all services provided.    Arthur Moore will return within approximately 1-2 weeks for an interactive feedback session with Dr. Donavon Fudge at which time his test performances, clinical impressions, and treatment recommendations will be reviewed in detail. Arthur Moore understands he can contact our office should he require our assistance before this time.  A total of 105 minutes of billable time were spent face-to-face with Arthur Moore by the psychometrist. This includes both test administration and scoring time. Billing for these services is reflected in the clinical report generated by Dr. Janice Meeter, Psy.D.  This note reflects time spent with the psychometrician and does not include test scores or any clinical interpretations made by Dr. Donavon Fudge. The full report will follow in a separate note.

## 2023-10-15 DIAGNOSIS — H2511 Age-related nuclear cataract, right eye: Secondary | ICD-10-CM | POA: Diagnosis not present

## 2023-10-19 ENCOUNTER — Ambulatory Visit (INDEPENDENT_AMBULATORY_CARE_PROVIDER_SITE_OTHER): Payer: Self-pay

## 2023-10-19 DIAGNOSIS — J309 Allergic rhinitis, unspecified: Secondary | ICD-10-CM

## 2023-10-20 ENCOUNTER — Ambulatory Visit: Admitting: Allergy and Immunology

## 2023-10-21 ENCOUNTER — Encounter: Admitting: Psychology

## 2023-10-22 ENCOUNTER — Ambulatory Visit (INDEPENDENT_AMBULATORY_CARE_PROVIDER_SITE_OTHER): Admitting: Psychology

## 2023-10-22 DIAGNOSIS — R419 Unspecified symptoms and signs involving cognitive functions and awareness: Secondary | ICD-10-CM | POA: Diagnosis not present

## 2023-10-22 DIAGNOSIS — D696 Thrombocytopenia, unspecified: Secondary | ICD-10-CM | POA: Diagnosis not present

## 2023-10-22 DIAGNOSIS — E559 Vitamin D deficiency, unspecified: Secondary | ICD-10-CM | POA: Diagnosis not present

## 2023-10-22 DIAGNOSIS — E7800 Pure hypercholesterolemia, unspecified: Secondary | ICD-10-CM | POA: Diagnosis not present

## 2023-10-22 DIAGNOSIS — I25118 Atherosclerotic heart disease of native coronary artery with other forms of angina pectoris: Secondary | ICD-10-CM | POA: Diagnosis not present

## 2023-10-22 NOTE — Progress Notes (Signed)
   NEUROPSYCHOLOGY FEEDBACK SESSION [place]. [facility]  [place] Department of Neurology  Date of Feedback Session: 10/22/2023  REASON FOR REFERRAL   Arthur Moore is a 73 year old, right-handed, White male with 17 years of formal education. He was referred for neuropsychological evaluation by Camie Sevin, PA-C, to assess current neurocognitive functioning, document potential cognitive deficits, and assist with treatment planning. He previously underwent neuropsychological evaluation at [facility] by Maude Rattler, Psy.D., ABN, on 12/07/2019. Results from that evaluation were used as a baseline for comparison.  FEEDBACK   Patient completed a comprehensive neuropsychological evaluation on 10/14/2023. Please refer to that encounter for the full report and recommendations. Briefly, results indicated normal cognitive functioning, with mild relative improvements observed in select areas. There is no evidence of a neurocognitive disorder at this time. Patient's overall profile reflects well-preserved cognitive and functional abilities. It is encouraging that he has not perceived any cognitive decline over the past few years and has even reported some improvement in cognition associated with the use of hearing aids, as well as improved mood following recent medication changes. The latter is especially notable, as mood-related factors were previously considered the primary contributor to cognitive concerns in 2021. Ongoing management of mood and general medical health will be important in supporting optimal cognitive aging.  Today, the patient was accompanied by his wife. They were provided verbal feedback regarding the findings and impression during this visit, and their questions were answered. A copy of the report was provided at the conclusion of the visit.  DISPOSITION   Patient will follow up with the referring provider, Ms. Wertman. Cognitive re-evaluation does not appear to  be necessary at this time. Providers wishing to update treatment recommendations are encouraged to refer as appropriate.  SERVICE   This feedback session was conducted by Renda Beckwith, Psy.D. [place] of 03867 (60 minutes) was billed for Dr. Beckwith' time spent in preparing, conducting, and documenting the current feedback session.  This report was generated using voice recognition software. While this document has been carefully reviewed, transcription errors may be present. I apologize in advance for any inconvenience. Please contact me if further clarification is needed.

## 2023-10-24 DIAGNOSIS — I10 Essential (primary) hypertension: Secondary | ICD-10-CM | POA: Diagnosis not present

## 2023-10-24 DIAGNOSIS — I700 Atherosclerosis of aorta: Secondary | ICD-10-CM | POA: Diagnosis not present

## 2023-10-24 DIAGNOSIS — I25118 Atherosclerotic heart disease of native coronary artery with other forms of angina pectoris: Secondary | ICD-10-CM | POA: Diagnosis not present

## 2023-10-27 DIAGNOSIS — H43393 Other vitreous opacities, bilateral: Secondary | ICD-10-CM | POA: Diagnosis not present

## 2023-10-27 DIAGNOSIS — H43813 Vitreous degeneration, bilateral: Secondary | ICD-10-CM | POA: Diagnosis not present

## 2023-10-29 ENCOUNTER — Ambulatory Visit (INDEPENDENT_AMBULATORY_CARE_PROVIDER_SITE_OTHER)

## 2023-10-29 DIAGNOSIS — J309 Allergic rhinitis, unspecified: Secondary | ICD-10-CM | POA: Diagnosis not present

## 2023-11-10 ENCOUNTER — Inpatient Hospital Stay (HOSPITAL_BASED_OUTPATIENT_CLINIC_OR_DEPARTMENT_OTHER)
Admission: EM | Admit: 2023-11-10 | Discharge: 2023-11-13 | DRG: 287 | Disposition: A | Discharge status: Home / Self Care | Attending: Cardiology | Admitting: Cardiology

## 2023-11-10 ENCOUNTER — Emergency Department (HOSPITAL_BASED_OUTPATIENT_CLINIC_OR_DEPARTMENT_OTHER)

## 2023-11-10 ENCOUNTER — Other Ambulatory Visit: Payer: Self-pay

## 2023-11-10 DIAGNOSIS — R110 Nausea: Secondary | ICD-10-CM | POA: Diagnosis present

## 2023-11-10 DIAGNOSIS — Z9049 Acquired absence of other specified parts of digestive tract: Secondary | ICD-10-CM

## 2023-11-10 DIAGNOSIS — E785 Hyperlipidemia, unspecified: Secondary | ICD-10-CM | POA: Diagnosis present

## 2023-11-10 DIAGNOSIS — G4733 Obstructive sleep apnea (adult) (pediatric): Secondary | ICD-10-CM | POA: Diagnosis present

## 2023-11-10 DIAGNOSIS — F32A Depression, unspecified: Secondary | ICD-10-CM | POA: Diagnosis present

## 2023-11-10 DIAGNOSIS — Z801 Family history of malignant neoplasm of trachea, bronchus and lung: Secondary | ICD-10-CM

## 2023-11-10 DIAGNOSIS — M25472 Effusion, left ankle: Secondary | ICD-10-CM | POA: Diagnosis not present

## 2023-11-10 DIAGNOSIS — Z7951 Long term (current) use of inhaled steroids: Secondary | ICD-10-CM

## 2023-11-10 DIAGNOSIS — I200 Unstable angina: Secondary | ICD-10-CM

## 2023-11-10 DIAGNOSIS — M25512 Pain in left shoulder: Secondary | ICD-10-CM | POA: Diagnosis present

## 2023-11-10 DIAGNOSIS — Z818 Family history of other mental and behavioral disorders: Secondary | ICD-10-CM

## 2023-11-10 DIAGNOSIS — I25110 Atherosclerotic heart disease of native coronary artery with unstable angina pectoris: Secondary | ICD-10-CM | POA: Diagnosis present

## 2023-11-10 DIAGNOSIS — Z800 Family history of malignant neoplasm of digestive organs: Secondary | ICD-10-CM

## 2023-11-10 DIAGNOSIS — R0789 Other chest pain: Principal | ICD-10-CM | POA: Diagnosis present

## 2023-11-10 DIAGNOSIS — I700 Atherosclerosis of aorta: Secondary | ICD-10-CM | POA: Diagnosis present

## 2023-11-10 DIAGNOSIS — R0609 Other forms of dyspnea: Secondary | ICD-10-CM | POA: Diagnosis not present

## 2023-11-10 DIAGNOSIS — R079 Chest pain, unspecified: Secondary | ICD-10-CM | POA: Diagnosis present

## 2023-11-10 DIAGNOSIS — Z8673 Personal history of transient ischemic attack (TIA), and cerebral infarction without residual deficits: Secondary | ICD-10-CM

## 2023-11-10 DIAGNOSIS — L7632 Postprocedural hematoma of skin and subcutaneous tissue following other procedure: Secondary | ICD-10-CM | POA: Diagnosis not present

## 2023-11-10 DIAGNOSIS — Z9185 Personal history of military service: Secondary | ICD-10-CM

## 2023-11-10 DIAGNOSIS — Y840 Cardiac catheterization as the cause of abnormal reaction of the patient, or of later complication, without mention of misadventure at the time of the procedure: Secondary | ICD-10-CM | POA: Diagnosis not present

## 2023-11-10 DIAGNOSIS — K7400 Hepatic fibrosis, unspecified: Secondary | ICD-10-CM | POA: Diagnosis present

## 2023-11-10 DIAGNOSIS — I351 Nonrheumatic aortic (valve) insufficiency: Secondary | ICD-10-CM | POA: Diagnosis present

## 2023-11-10 DIAGNOSIS — F419 Anxiety disorder, unspecified: Secondary | ICD-10-CM | POA: Diagnosis present

## 2023-11-10 DIAGNOSIS — Z7982 Long term (current) use of aspirin: Secondary | ICD-10-CM

## 2023-11-10 DIAGNOSIS — J45909 Unspecified asthma, uncomplicated: Secondary | ICD-10-CM | POA: Diagnosis present

## 2023-11-10 DIAGNOSIS — Z79899 Other long term (current) drug therapy: Secondary | ICD-10-CM

## 2023-11-10 DIAGNOSIS — I129 Hypertensive chronic kidney disease with stage 1 through stage 4 chronic kidney disease, or unspecified chronic kidney disease: Secondary | ICD-10-CM | POA: Diagnosis present

## 2023-11-10 DIAGNOSIS — D696 Thrombocytopenia, unspecified: Secondary | ICD-10-CM | POA: Diagnosis present

## 2023-11-10 DIAGNOSIS — N182 Chronic kidney disease, stage 2 (mild): Secondary | ICD-10-CM | POA: Diagnosis present

## 2023-11-10 HISTORY — DX: Atherosclerotic heart disease of native coronary artery without angina pectoris: I25.10

## 2023-11-10 LAB — CBC
HCT: 39.7 % (ref 39.0–52.0)
Hemoglobin: 13.8 g/dL (ref 13.0–17.0)
MCH: 30.2 pg (ref 26.0–34.0)
MCHC: 34.8 g/dL (ref 30.0–36.0)
MCV: 86.9 fL (ref 80.0–100.0)
Platelets: 105 K/uL — ABNORMAL LOW (ref 150–400)
RBC: 4.57 MIL/uL (ref 4.22–5.81)
RDW: 13.2 % (ref 11.5–15.5)
WBC: 5.5 K/uL (ref 4.0–10.5)
nRBC: 0 % (ref 0.0–0.2)

## 2023-11-10 LAB — TROPONIN T, HIGH SENSITIVITY
Troponin T High Sensitivity: 9 ng/L (ref <19)
Troponin T High Sensitivity: <15 ng/L (ref <19)

## 2023-11-10 LAB — PRO BRAIN NATRIURETIC PEPTIDE: Pro Brain Natriuretic Peptide: 103 pg/mL (ref <300.0)

## 2023-11-10 LAB — BASIC METABOLIC PANEL WITH GFR
Anion gap: 10 (ref 5–15)
BUN: 19 mg/dL (ref 8–23)
CO2: 23 mmol/L (ref 22–32)
Calcium: 9.4 mg/dL (ref 8.9–10.3)
Chloride: 107 mmol/L (ref 98–111)
Creatinine, Ser: 1.22 mg/dL (ref 0.61–1.24)
GFR, Estimated: >60 mL/min (ref >60)
Glucose, Bld: 101 mg/dL — ABNORMAL HIGH (ref 70–99)
Potassium: 4.3 mmol/L (ref 3.5–5.1)
Sodium: 141 mmol/L (ref 135–145)

## 2023-11-10 LAB — D-DIMER, QUANTITATIVE: D-Dimer, Quant: 0.3 ug{FEU}/mL (ref 0.00–0.50)

## 2023-11-10 MED ORDER — HYDROXYZINE HCL 25 MG PO TABS
25.0000 mg | ORAL_TABLET | Freq: Once | ORAL | Status: AC
  Administered 2023-11-10: 25 mg via ORAL
  Filled 2023-11-10: qty 1

## 2023-11-10 MED ORDER — TRAZODONE HCL 50 MG PO TABS
50.0000 mg | ORAL_TABLET | Freq: Every day | ORAL | Status: DC
  Administered 2023-11-11 – 2023-11-12 (×3): 50 mg via ORAL
  Filled 2023-11-10 (×3): qty 1

## 2023-11-10 NOTE — ED Notes (Signed)
 Pt. Reports shallow breathing for a couple of mths and now getting worse and swelling in his ankles.

## 2023-11-10 NOTE — ED Provider Notes (Signed)
 [place] [facility]ERGENC[facility] DEPARTMENT AT [facility] Provider Note   CSN: [id number] Arrival date & time: 11/10/23  1407     Patient presents with: Chest Pain and Dizziness   Arthur Moore is a 73 y.o. male.    Chest Pain Associated symptoms: dizziness   Dizziness Associated symptoms: chest pain     73 year old male presents emergency department with some chest pain, left arm/back pain, shortness of breath, lightheadedness.  States that he is felt progressively more short of breath over the past week or so.  Has been dealing with some left-sided chest discomfort that would radiate to his left shoulder blade/left upper arm that he has noticed for the past 1 to 2 days.  States that this is worsening with movement whenever he is walking around and is relieved with rest.  States that his shortness of breath is also worsened with activity.  Denies any fevers, chills, abdominal pain, vomiting but does report some associated nausea when his symptoms are significant.  Past medical history significant for aortic insufficiency, hypertension, cervical disc disease, lumbar disc, psoriasis, diastolic dysfunction, GERD, CKD 2, ascending aortic aneurysm  Prior to Admission medications   Medication Sig Start Date End Date Taking? Authorizing Provider  albuterol  (VENTOLIN  HFA) 108 (90 Base) MCG/ACT inhaler Inhale 2 puffs into the lungs every 4 (four) hours as needed for wheezing or shortness of breath. 07/07/23   Kozlow, Camellia PARAS, MD  aspirin  EC 81 MG tablet Take 81 mg by mouth daily.    [provider]  budesonide  (PULMICORT ) 0.25 MG/2ML nebulizer solution Take 2 mLs (0.25 mg total) by nebulization every 4 (four) hours as needed (During flare ups). 07/07/23   Kozlow, Camellia PARAS, MD  budesonide -formoterol  (SYMBICORT ) 160-4.5 MCG/ACT inhaler Inhale 2 puffs into the lungs 2 (two) times daily. With spacer (empty lungs) 07/07/23   Kozlow, Camellia PARAS, MD  busPIRone  (BUSPAR ) 10 MG tablet Take 10 mg by  mouth 2 (two) times daily. 10/07/23   [provider]  cetirizine  (ZYRTEC  ALLERGY) 10 MG tablet Take 1 tablet (10 mg total) by mouth daily as needed (can take an extra dose during flare ups). Patient taking differently: Take 10 mg by mouth daily. 07/07/23   Kozlow, Camellia PARAS, MD  chlorpheniramine-HYDROcodone (TUSSIONEX) 10-8 MG/5ML Take 5 mLs by mouth at bedtime as needed for cough. Patient not taking: Reported on 10/14/2023 09/18/23   Iola Lukes, FNP  Cholecalciferol  125 MCG (5000 UT) capsule as directed Orally Once a day    [provider]  Dextromethorphan-guaiFENesin  (MUCINEX  DM MAXIMUM STRENGTH) 60-1200 MG TB12 Take 1 tablet by mouth 2 (two) times daily. Patient not taking: Reported on 10/14/2023 09/18/23   Murrill, Samantha, FNP  fluticasone  (FLONASE ) 50 MCG/ACT nasal spray Place 2 sprays into both nostrils daily. 1-2 sprays each nostril 1 (ONE) time per day. Patient taking differently: Place 2 sprays into both nostrils as needed. 1-2 sprays each nostril 1 (ONE) time per day. 03/31/23   Kozlow, Camellia PARAS, MD  hydrOXYzine  (ATARAX ) 50 MG tablet Take 50 mg by mouth 3 (three) times daily as needed for anxiety (PTSD).    [provider]  lamoTRIgine  (LAMICTAL ) 200 MG tablet Take 1 tablet (200 mg total) by mouth 2 (two) times daily. 02/05/22   Mozingo, Regina Nattalie, NP  metoprolol  tartrate (LOPRESSOR ) 25 MG tablet TAKE 1/2 TABLET BY MOUTH TWICE A DAY 07/21/22   Raford Riggs, MD  nitroGLYCERIN  (NITROSTAT ) 0.4 MG SL tablet Place 1 tablet (0.4 mg total) under the tongue  every 5 (five) minutes as needed for chest pain. Patient not taking: Reported on 10/14/2023 07/11/22 03/31/23  Vannie Reche RAMAN, NP  pantoprazole  (PROTONIX ) 40 MG tablet Take 1 tablet (40 mg total) by mouth 2 (two) times daily. 07/07/23   Kozlow, Camellia PARAS, MD  Respiratory Therapy Supplies (NEBULIZER MASK ADULT) MISC 1 Device by Does not apply route as directed. 07/07/23   Kozlow, Camellia PARAS, MD  rosuvastatin  (CRESTOR )  10 MG tablet Take 1 tablet by mouth daily.    [provider]  sertraline  (ZOLOFT ) 100 MG tablet Take 50 mg by mouth. 10/07/23   [provider]  Spacer/Aero-Holding Raguel DEVI 1 Device by Does not apply route as directed. 07/07/23   Kozlow, Camellia PARAS, MD  vortioxetine HBr (TRINTELLIX) 5 MG TABS tablet Take by mouth. Patient not taking: Reported on 10/14/2023 08/05/23   [provider]    Allergies: Patient has no known allergies.    Review of Systems  Cardiovascular:  Positive for chest pain.  Neurological:  Positive for dizziness.  All other systems reviewed and are negative.   Updated Vital Signs BP 133/72 (BP Location: Right Arm)   Pulse 64   Temp 97.8 F (36.6 C)   Resp 16   Ht 6' (1.829 m)   Wt 83.5 kg   SpO2 96%   BMI 24.97 kg/m   Physical Exam Vitals and nursing note reviewed.  Constitutional:      General: He is not in acute distress.    Appearance: He is well-developed.  HENT:     Head: Normocephalic and atraumatic.  Eyes:     Conjunctiva/sclera: Conjunctivae normal.  Cardiovascular:     Rate and Rhythm: Normal rate and regular rhythm.  Pulmonary:     Effort: Pulmonary effort is normal. No respiratory distress.     Breath sounds: Normal breath sounds. No wheezing, rhonchi or rales.  Abdominal:     Palpations: Abdomen is soft.     Tenderness: There is no abdominal tenderness. There is no guarding.  Musculoskeletal:        General: No swelling.     Cervical back: Neck supple.     Comments: 0-1+ pitting edema left greater than right lower extremities.  Skin:    General: Skin is warm and dry.     Capillary Refill: Capillary refill takes less than 2 seconds.  Neurological:     Mental Status: He is alert.  Psychiatric:        Mood and Affect: Mood normal.     (all labs ordered are listed, but only abnormal results are displayed) Labs Reviewed  CBC - Abnormal; Notable for the following components:      Result Value   Platelets 105  (*)    All other components within normal limits  BASIC METABOLIC PANEL WITH GFR  TROPONIN T, HIGH SENSITIVITY    EKG: None  Radiology: DG Chest 2 View Result Date: 11/10/2023 CLINICAL DATA:  Chest pain EXAM: CHEST - 2 VIEW COMPARISON:  None Available. FINDINGS: The heart size and mediastinal contours are within normal limits. No consolidation, pneumothorax or effusion. No edema. The visualized skeletal structures are unremarkable. Degenerative changes are seen along the spine. IMPRESSION: No acute cardiopulmonary disease. Electronically Signed   By: Ranell Bring M.D.   On: 11/10/2023 14:27     Procedures   Medications Ordered in the ED - No data to display  Medical Decision Making Amount and/or Complexity of Data Reviewed Labs: ordered. Radiology: ordered.   This patient presents to the ED for concern of chest pain, this involves an extensive number of treatment options, and is a complaint that carries with it a high risk of complications and morbidity.  The differential diagnosis includes ACS, PE, pneumothorax, pericarditis/myocarditis/tamponade, aortic dissection, pneumonia, CHF, CVD/asthma, MSK, GERD, anxiety, other   Co morbidities that complicate the patient evaluation  See HPI   Additional history obtained:  Additional history obtained from EMR External records from outside source obtained and reviewed including hospital records   Lab Tests:  I Ordered, and personally interpreted labs.  The pertinent results include: Troponin of 9 with repeat pending.  BNP of 103.  D-dimer negative.  No leukocytosis.  No evidence of anemia.  Thrombocytopenia 103.  No Electra abnormalities.  No renal dysfunction.   Imaging Studies ordered:  I ordered imaging studies including chest x-ray I independently visualized and interpreted imaging which showed no acute cardiopulmonary abnormality I agree with the radiologist interpretation   Cardiac  Monitoring: / EKG:  The patient was maintained on a cardiac monitor.  I personally viewed and interpreted the cardiac monitored which showed an underlying rhythm of: Sinus rhythm   Consultations Obtained:  See ED course  Problem List / ED Course / Critical interventions / Medication management  Exertional chest pain/dyspnea Reevaluation of the patient showed that the patient stayed the same I have reviewed the patients home medicines and have made adjustments as needed   Social Determinants of Health:  Denies tobacco, illicit drug use.   Test / Admission - Considered:  Exertional chest pain/dyspnea Vitals signs within normal range and stable throughout visit. Laboratory/imaging studies significant for: See above 73 year old male presents emergency department with some chest pain, left arm/back pain, shortness of breath, lightheadedness.  States that he is felt progressively more short of breath over the past week or so.  Has been dealing with some left-sided chest discomfort that would radiate to his left shoulder blade/left upper arm that he has noticed for the past 1 to 2 days.  States that this is worsening with movement whenever he is walking around and is relieved with rest.  States that his shortness of breath is also worsened with activity.  Denies any fevers, chills, abdominal pain, vomiting but does report some associated nausea when his symptoms are significant. On exam, lungs clear to auscultation bilaterally.  No abdominal tenderness.  Workup today reassuring.  Negative troponin, lack of acute ischemic change on EKG; low suspicion for ACS; pending second troponin at time of admission.  BNP negative, chest x-ray without obvious pulmonary vascular tension/pleural effusion without clinical evidence of volume overload; low suspicion for CHF.  D-dimer negative; low suspicion for PE.  Patient without persistent chest pain rating to back, pulse deficits, neurodeficits, hypertension;  low suspicion for aortic dissection.  Chest x-ray without obvious pneumonia, pneumothorax or other acute cardiopulmonary abnormality.  Patient has known CAD with recent CT coronary study performed last year with a coronary calcium  score of greater than a 1000.  Given patient's worsening exertional symptoms with relief at rest, consult to cardiology recommend admission to the hospital for cardiac catheterization.  Treatment plan discussed with patient and he was agreeable.  Patient stable upon admission.     Final diagnoses:  None    ED Discharge Orders     None          Silver Wonda LABOR, [place] 11/10/23 1702  Pamella Ozell LABOR, DO 11/17/23 1533

## 2023-11-10 NOTE — ED Triage Notes (Signed)
 Pt here with cp, dizziness, and left arm pain since yesterday. Pt states pain is in his left shoulder blade radiating to his left arm. Pt states he is light headed and dizzy. Pt also endorse nausea. Pt also has cardiac hx.

## 2023-11-10 NOTE — ED Notes (Signed)
Pt taken for chest xray

## 2023-11-11 ENCOUNTER — Encounter (HOSPITAL_COMMUNITY): Payer: Self-pay | Admitting: Cardiology

## 2023-11-11 DIAGNOSIS — G4733 Obstructive sleep apnea (adult) (pediatric): Secondary | ICD-10-CM | POA: Diagnosis present

## 2023-11-11 DIAGNOSIS — E785 Hyperlipidemia, unspecified: Secondary | ICD-10-CM | POA: Diagnosis present

## 2023-11-11 DIAGNOSIS — R0609 Other forms of dyspnea: Secondary | ICD-10-CM | POA: Diagnosis present

## 2023-11-11 DIAGNOSIS — I2510 Atherosclerotic heart disease of native coronary artery without angina pectoris: Secondary | ICD-10-CM | POA: Diagnosis not present

## 2023-11-11 DIAGNOSIS — J45909 Unspecified asthma, uncomplicated: Secondary | ICD-10-CM

## 2023-11-11 DIAGNOSIS — K7400 Hepatic fibrosis, unspecified: Secondary | ICD-10-CM | POA: Diagnosis present

## 2023-11-11 DIAGNOSIS — M25472 Effusion, left ankle: Secondary | ICD-10-CM | POA: Diagnosis not present

## 2023-11-11 DIAGNOSIS — I700 Atherosclerosis of aorta: Secondary | ICD-10-CM | POA: Diagnosis present

## 2023-11-11 DIAGNOSIS — Y840 Cardiac catheterization as the cause of abnormal reaction of the patient, or of later complication, without mention of misadventure at the time of the procedure: Secondary | ICD-10-CM | POA: Diagnosis not present

## 2023-11-11 DIAGNOSIS — R110 Nausea: Secondary | ICD-10-CM | POA: Diagnosis present

## 2023-11-11 DIAGNOSIS — Z9185 Personal history of military service: Secondary | ICD-10-CM | POA: Diagnosis not present

## 2023-11-11 DIAGNOSIS — F419 Anxiety disorder, unspecified: Secondary | ICD-10-CM | POA: Diagnosis present

## 2023-11-11 DIAGNOSIS — L7632 Postprocedural hematoma of skin and subcutaneous tissue following other procedure: Secondary | ICD-10-CM | POA: Diagnosis not present

## 2023-11-11 DIAGNOSIS — I351 Nonrheumatic aortic (valve) insufficiency: Secondary | ICD-10-CM | POA: Diagnosis present

## 2023-11-11 DIAGNOSIS — I129 Hypertensive chronic kidney disease with stage 1 through stage 4 chronic kidney disease, or unspecified chronic kidney disease: Secondary | ICD-10-CM | POA: Diagnosis present

## 2023-11-11 DIAGNOSIS — D696 Thrombocytopenia, unspecified: Secondary | ICD-10-CM | POA: Diagnosis present

## 2023-11-11 DIAGNOSIS — I25110 Atherosclerotic heart disease of native coronary artery with unstable angina pectoris: Secondary | ICD-10-CM | POA: Diagnosis present

## 2023-11-11 DIAGNOSIS — I200 Unstable angina: Secondary | ICD-10-CM

## 2023-11-11 DIAGNOSIS — R0789 Other chest pain: Secondary | ICD-10-CM | POA: Diagnosis present

## 2023-11-11 DIAGNOSIS — M25512 Pain in left shoulder: Secondary | ICD-10-CM | POA: Diagnosis present

## 2023-11-11 DIAGNOSIS — N182 Chronic kidney disease, stage 2 (mild): Secondary | ICD-10-CM | POA: Diagnosis present

## 2023-11-11 DIAGNOSIS — F32A Depression, unspecified: Secondary | ICD-10-CM | POA: Diagnosis present

## 2023-11-11 LAB — COMPREHENSIVE METABOLIC PANEL WITH GFR
ALT: 38 U/L (ref 0–44)
AST: 38 U/L (ref 15–41)
Albumin: 3.8 g/dL (ref 3.5–5.0)
Alkaline Phosphatase: 50 U/L (ref 38–126)
Anion gap: 9 (ref 5–15)
BUN: 15 mg/dL (ref 8–23)
CO2: 22 mmol/L (ref 22–32)
Calcium: 9 mg/dL (ref 8.9–10.3)
Chloride: 108 mmol/L (ref 98–111)
Creatinine, Ser: 1.27 mg/dL — ABNORMAL HIGH (ref 0.61–1.24)
GFR, Estimated: >60 mL/min (ref >60)
Glucose, Bld: 139 mg/dL — ABNORMAL HIGH (ref 70–99)
Potassium: 3.7 mmol/L (ref 3.5–5.1)
Sodium: 139 mmol/L (ref 135–145)
Total Bilirubin: 1.1 mg/dL (ref 0.0–1.2)
Total Protein: 6.1 g/dL — ABNORMAL LOW (ref 6.5–8.1)

## 2023-11-11 LAB — LIPASE, BLOOD: Lipase: 52 U/L — ABNORMAL HIGH (ref 11–51)

## 2023-11-11 LAB — CBC
HCT: 41.3 % (ref 39.0–52.0)
Hemoglobin: 14.2 g/dL (ref 13.0–17.0)
MCH: 30.1 pg (ref 26.0–34.0)
MCHC: 34.4 g/dL (ref 30.0–36.0)
MCV: 87.7 fL (ref 80.0–100.0)
Platelets: 102 K/uL — ABNORMAL LOW (ref 150–400)
RBC: 4.71 MIL/uL (ref 4.22–5.81)
RDW: 13.3 % (ref 11.5–15.5)
WBC: 5.1 K/uL (ref 4.0–10.5)
nRBC: 0 % (ref 0.0–0.2)

## 2023-11-11 LAB — TSH: TSH: 1.31 u[IU]/mL (ref 0.350–4.500)

## 2023-11-11 MED ORDER — ONDANSETRON HCL 4 MG/2ML IJ SOLN: 4.0000 mg | Freq: Four times a day (QID) | INTRAMUSCULAR | Status: DC | PRN

## 2023-11-11 MED ORDER — SODIUM CHLORIDE 0.9% FLUSH: 3.0000 mL | INTRAVENOUS | Status: DC | PRN

## 2023-11-11 MED ORDER — ASPIRIN 81 MG PO TBEC
81.0000 mg | DELAYED_RELEASE_TABLET | Freq: Every day | ORAL | Status: DC
  Administered 2023-11-11 – 2023-11-13 (×2): 81 mg via ORAL
  Filled 2023-11-11 (×2): qty 1

## 2023-11-11 MED ORDER — BUDESONIDE 0.25 MG/2ML IN SUSP: 0.2500 mg | RESPIRATORY_TRACT | Status: DC | PRN

## 2023-11-11 MED ORDER — LAMOTRIGINE 100 MG PO TABS
200.0000 mg | ORAL_TABLET | Freq: Two times a day (BID) | ORAL | Status: DC
  Administered 2023-11-11 – 2023-11-13 (×4): 200 mg via ORAL
  Filled 2023-11-11 (×4): qty 2

## 2023-11-11 MED ORDER — SODIUM CHLORIDE 0.9 % WEIGHT BASED INFUSION
1.0000 mL/kg/h | INTRAVENOUS | Status: DC
  Administered 2023-11-12: 1 mL/kg/h via INTRAVENOUS

## 2023-11-11 MED ORDER — LORATADINE 10 MG PO TABS
10.0000 mg | ORAL_TABLET | Freq: Every day | ORAL | Status: DC
  Administered 2023-11-12 – 2023-11-13 (×2): 10 mg via ORAL
  Filled 2023-11-11 (×3): qty 1

## 2023-11-11 MED ORDER — HYDROXYZINE HCL 10 MG PO TABS
10.0000 mg | ORAL_TABLET | ORAL | Status: DC | PRN
  Administered 2023-11-12: 10 mg via ORAL
  Filled 2023-11-11: qty 1

## 2023-11-11 MED ORDER — SERTRALINE HCL 100 MG PO TABS
100.0000 mg | ORAL_TABLET | Freq: Every morning | ORAL | Status: DC
Start: 2023-11-11 — End: 2023-11-11
  Filled 2023-11-11: qty 1

## 2023-11-11 MED ORDER — BUSPIRONE HCL 5 MG PO TABS
10.0000 mg | ORAL_TABLET | Freq: Two times a day (BID) | ORAL | Status: DC
  Administered 2023-11-11 – 2023-11-13 (×4): 10 mg via ORAL
  Filled 2023-11-11 (×4): qty 2

## 2023-11-11 MED ORDER — SODIUM CHLORIDE 0.9% FLUSH
3.0000 mL | Freq: Two times a day (BID) | INTRAVENOUS | Status: DC
  Administered 2023-11-12 – 2023-11-13 (×3): 3 mL via INTRAVENOUS

## 2023-11-11 MED ORDER — PANTOPRAZOLE SODIUM 40 MG PO TBEC
40.0000 mg | DELAYED_RELEASE_TABLET | Freq: Every day | ORAL | Status: DC
  Administered 2023-11-11 – 2023-11-13 (×3): 40 mg via ORAL
  Filled 2023-11-11 (×3): qty 1

## 2023-11-11 MED ORDER — NITROGLYCERIN 0.4 MG SL SUBL: 0.4000 mg | SUBLINGUAL_TABLET | SUBLINGUAL | Status: DC | PRN

## 2023-11-11 MED ORDER — ROSUVASTATIN CALCIUM 20 MG PO TABS
20.0000 mg | ORAL_TABLET | Freq: Every evening | ORAL | Status: DC
  Administered 2023-11-11 – 2023-11-12 (×2): 20 mg via ORAL
  Filled 2023-11-11 (×2): qty 1

## 2023-11-11 MED ORDER — OXYCODONE HCL 5 MG PO TABS
5.0000 mg | ORAL_TABLET | Freq: Once | ORAL | Status: AC
  Administered 2023-11-11: 5 mg via ORAL
  Filled 2023-11-11: qty 1

## 2023-11-11 MED ORDER — FLUTICASONE PROPIONATE 50 MCG/ACT NA SUSP
2.0000 | NASAL | Status: DC | PRN
Start: 2023-11-11 — End: 2023-11-13

## 2023-11-11 MED ORDER — VITAMIN D3 25 MCG (1000 UNIT) PO TABS
5000.0000 [IU] | ORAL_TABLET | Freq: Every evening | ORAL | Status: DC
  Administered 2023-11-11 – 2023-11-12 (×2): 5000 [IU] via ORAL
  Filled 2023-11-11 (×3): qty 5

## 2023-11-11 MED ORDER — SERTRALINE HCL 100 MG PO TABS
100.0000 mg | ORAL_TABLET | Freq: Every day | ORAL | Status: DC
  Administered 2023-11-11 – 2023-11-13 (×3): 100 mg via ORAL
  Filled 2023-11-11 (×2): qty 1

## 2023-11-11 MED ORDER — DICLOFENAC SODIUM 1 % EX GEL
2.0000 g | Freq: Four times a day (QID) | CUTANEOUS | Status: DC | PRN
  Filled 2023-11-11: qty 100

## 2023-11-11 MED ORDER — FLUTICASONE FUROATE-VILANTEROL 200-25 MCG/ACT IN AEPB
1.0000 | INHALATION_SPRAY | Freq: Every day | RESPIRATORY_TRACT | Status: DC
Start: 2023-11-11 — End: 2023-11-11
  Filled 2023-11-11: qty 28

## 2023-11-11 MED ORDER — BUDESONIDE-FORMOTEROL FUMARATE 160-4.5 MCG/ACT IN AERO
2.0000 | INHALATION_SPRAY | Freq: Two times a day (BID) | RESPIRATORY_TRACT | Status: DC
  Administered 2023-11-12 – 2023-11-13 (×2): 2 via RESPIRATORY_TRACT
  Filled 2023-11-11: qty 6

## 2023-11-11 MED ORDER — SODIUM CHLORIDE 0.9 % WEIGHT BASED INFUSION
3.0000 mL/kg/h | INTRAVENOUS | Status: DC
  Administered 2023-11-12: 3 mL/kg/h via INTRAVENOUS

## 2023-11-11 MED ORDER — ASPIRIN 81 MG PO CHEW
81.0000 mg | CHEWABLE_TABLET | ORAL | Status: AC
  Administered 2023-11-12: 81 mg via ORAL
  Filled 2023-11-11: qty 1

## 2023-11-11 MED ORDER — NON FORMULARY: 2.0000 | Freq: Two times a day (BID) | Status: DC

## 2023-11-11 MED ORDER — ACETAMINOPHEN 325 MG PO TABS
650.0000 mg | ORAL_TABLET | ORAL | Status: DC | PRN
  Filled 2023-11-11: qty 2

## 2023-11-11 MED ORDER — HEPARIN SODIUM (PORCINE) 5000 UNIT/ML IJ SOLN
5000.0000 [IU] | Freq: Three times a day (TID) | INTRAMUSCULAR | Status: DC
  Administered 2023-11-11 – 2023-11-13 (×5): 5000 [IU] via SUBCUTANEOUS
  Filled 2023-11-11 (×4): qty 1

## 2023-11-11 MED ORDER — ALBUTEROL SULFATE (2.5 MG/3ML) 0.083% IN NEBU: 2.5000 mg | INHALATION_SOLUTION | RESPIRATORY_TRACT | Status: DC | PRN

## 2023-11-11 MED ORDER — SODIUM CHLORIDE 0.9 % IV SOLN: 250.0000 mL | INTRAVENOUS | Status: AC | PRN

## 2023-11-11 MED ORDER — LAMOTRIGINE 100 MG PO TABS
200.0000 mg | ORAL_TABLET | Freq: Two times a day (BID) | ORAL | Status: DC
Start: 2023-11-11 — End: 2023-11-11

## 2023-11-11 NOTE — Progress Notes (Addendum)
 Per d/w MD, lipase added to updated labs. We will follow his platelet count and mild CKD during admission. Will also offer Voltaren  gel and ice packs (15-20 minutes followed by 45 minutes without ice) as needed for shoulder discomfort in case of MSK component. I also spoke with pharmacist for input on his lamictal , sertraline  dose timing. They will help adjust lamictal  dose to be given now (so that he does not go too long without last dose), and to reassign AM timing of sertraline  to 10am to give more time between this afternoon's dose and tomorrow AM's dose.

## 2023-11-11 NOTE — Progress Notes (Addendum)
 Pt requesting Symbicort , not on formulary. Pharmacy able to order if entered as nonformulary which I have done. D/C Breo as he says this doesn't work for him. Lipase marginally up on labs, LFTs OK, trend in AM.

## 2023-11-11 NOTE — H&P (Addendum)
 Cardiology Admission History and Physical   Patient ID: Arthur Moore MRN: [id number]; DOB: February 19, 1951   Admission date: 11/10/2023  PCP:  Keiden Deskin Motto, DO   [facility] Providers Cardiologist:  Annabella Scarce, MD       Chief Complaint:  left shoulder pain  Patient Profile: Arthur Moore is a 73 y.o. male with nonobstructive CAD, moderate AI (last echo 06/2023), TIA (carotid US  near normal 2023), liver fibrosis, anxiety, depression, HTN, HLD, arthritis, cervical/lumbar disc disease, asthma, aortic atherosclerosis, chronic appearing thrombocytopenia and CKD stage 2-3a by labs who is being seen 11/11/2023 for the evaluation of chest pain.  History of Present Illness: Mr. Daluz was previously [place] in [place] for [place]urg[place], more recently last assessed in 06/2023 here as moderate. Prior holter in 2015 reportedly showed rare PACs/PVCs. He has had occasional testing for chest pain and/or SOB over the years including stress testing, heart cath and coronary CTAs. Cardiac cath 06/2018 showed mild nonobstructive CAD. Last coronary CTA 06/2022 showed moderate mixed CAD (50-69% prox LAD, 1-24% D1, 1-24% AV groove Cx, 1-24% RCA, FFR negative, CAC 1027/87%ile, aortic atherosclerosis otherwise. Last echo 06/2023 showed EF 50-55% without RWMA, G1DD, moderate AI with follow-up planned 1 year.  He presented to [facility] yesterday afternoon with posterior left shoulder pain, chest pain, shallow breathing, dizziness, and nausea. The shallow breathing has been going on for months, reporting he had an asthmatic bronchitis flare that took a few weeks and several allergist visits/medication adjustments to finally get over, but still deals with some shallow breathing/DOE. The chest pain is a vague chest ache that happens about half the month without any provoking features and is the same discomfort that prompted the last cardiac CT, no specific change in this symptom. On Monday he began  having intermittent nausea which gradually evolved into Tuesday with posterior left shoulder pain and fairly persistent dizziness without vomiting or syncope. ED note indicated shoulder/chest pain was worse with exertion. He tells me the chest pain and shoulder pain are not changed with exertion, position changes, inspiration or palpation. The shoulder pain is essentially there all the time at a low level, with flares that come and go without rhyme or reason. He also describes left ankle swelling yesterday. This is not apparent on exam today. Labs show troponin neg x2, pBNP wnl, Cr 1.22 (baseline 1.2-1.4), plt 105 (prior range 95-145, last 126), d-dimer wnl. CXR NAD. VSS - BP range 107-149, HR/pOx wnl. EKG shows NSR without acute STT changes, nonspecific TWI avL seen back to 2021 as well. He received 25mg  hydyoxyzine last night otherwise has not gotten home meds yet today. He and his wife are requesting to only take the meds they brought from home due to cost and were informed that they need to be given from the hospital pharmacy due to hospital policy.    Past Medical History:  Diagnosis Date   Aortic insufficiency    Arthritis    Asthma    CAD (coronary artery disease)    Carotid bruit 07/09/2021   carotid ultrasound near normal 2023   Cervical disc disease    Depression    Diastolic dysfunction 06/20/2016   Hyperlipidemia 08/24/2015   Hypertension    Lumbar disc disease    Past Surgical History:  Procedure Laterality Date   CHOLECYSTECTOMY  2012   HERNIA REPAIR     KNEE ARTHROSCOPY W/ MENISCAL REPAIR  11/2021   LEFT HEART CATH AND CORONARY ANGIOGRAPHY N/A 07/08/2018   Procedure: LEFT HEART CATH  AND CORONARY ANGIOGRAPHY;  Surgeon: Verlin Lonni BIRCH, MD;  Location: [facility]AS[facility];  Service: Cardiovascular;  Laterality: N/A;   PAROTIDECTOMY Right 1992   PROSTATE SURGERY  2002, 2004   SINOSCOPY     SINUS EXPLORATION  2013   SPINE SURGERY  2013   L C7-T1 laminectomy and  discectomy   TONSILLECTOMY       Medications Prior to Admission: Prior to Admission medications   Medication Sig Start Date End Date Taking? Authorizing Provider  albuterol  (PROVENTIL ) (2.5 MG/3ML) 0.083% nebulizer solution Take 2.5 mg by nebulization as needed for wheezing or shortness of breath. 09/28/23  Yes [provider]  albuterol  (VENTOLIN  HFA) 108 (90 Base) MCG/ACT inhaler Inhale 2 puffs into the lungs every 4 (four) hours as needed for wheezing or shortness of breath. 07/07/23  Yes Kozlow, Camellia PARAS, MD  aspirin  EC 81 MG tablet Take 81 mg by mouth daily.   Yes [provider]  budesonide  (PULMICORT ) 0.25 MG/2ML nebulizer solution Take 2 mLs (0.25 mg total) by nebulization every 4 (four) hours as needed (During flare ups). 07/07/23  Yes Kozlow, Camellia PARAS, MD  budesonide -formoterol  (SYMBICORT ) 160-4.5 MCG/ACT inhaler Inhale 2 puffs into the lungs 2 (two) times daily. With spacer (empty lungs) 07/07/23  Yes Kozlow, Camellia PARAS, MD  busPIRone  (BUSPAR ) 10 MG tablet Take 10 mg by mouth 2 (two) times daily. 10/07/23  Yes [provider]  cetirizine  (ZYRTEC  ALLERGY) 10 MG tablet Take 1 tablet (10 mg total) by mouth daily as needed (can take an extra dose during flare ups). Patient taking differently: Take 10 mg by mouth daily. 07/07/23  Yes Kozlow, Camellia PARAS, MD  Cholecalciferol  125 MCG (5000 UT) TABS Take 5,000 Units by mouth every evening.   Yes [provider]  fluticasone  (FLONASE ) 50 MCG/ACT nasal spray Place 2 sprays into both nostrils daily. 1-2 sprays each nostril 1 (ONE) time per day. Patient taking differently: Place 2 sprays into both nostrils as needed for allergies. 1-2 sprays each nostril 1 (ONE) time per day. 03/31/23  Yes Kozlow, Camellia PARAS, MD  hydrOXYzine  (ATARAX ) 10 MG tablet Take 10 mg by mouth as needed for anxiety. 09/15/23  Yes [provider]  lamoTRIgine  (LAMICTAL ) 200 MG tablet Take 1 tablet (200 mg total) by mouth 2 (two) times daily. 02/05/22  Yes  Mozingo, Regina Nattalie, NP  metoprolol  tartrate (LOPRESSOR ) 25 MG tablet TAKE 1/2 TABLET BY MOUTH TWICE A DAY 07/21/22  Yes Raford Riggs, MD  nitroGLYCERIN  (NITROSTAT ) 0.4 MG SL tablet Place 1 tablet (0.4 mg total) under the tongue every 5 (five) minutes as needed for chest pain. 07/11/22 11/11/23 Yes Vannie Reche RAMAN, NP  pantoprazole  (PROTONIX ) 40 MG tablet Take 1 tablet (40 mg total) by mouth 2 (two) times daily. Patient taking differently: Take 40 mg by mouth daily. 07/07/23  Yes Kozlow, Camellia PARAS, MD  Respiratory Therapy Supplies (NEBULIZER MASK ADULT) MISC 1 Device by Does not apply route as directed. 07/07/23  Yes Kozlow, Camellia PARAS, MD  rosuvastatin  (CRESTOR ) 40 MG tablet Take 20 mg by mouth every evening. 09/22/23  Yes [provider]  sertraline  (ZOLOFT ) 100 MG tablet Take 100 mg by mouth in the morning. 10/07/23  Yes [provider]  Spacer/Aero-Holding Chambers DEVI 1 Device by Does not apply route as directed. 07/07/23  Yes Kozlow, Eric J, MD  chlorpheniramine-HYDROcodone (TUSSIONEX) 10-8 MG/5ML Take 5 mLs by mouth at bedtime as needed for cough. Patient not taking: No sig reported 09/18/23  Iola Lukes, FNP  Dextromethorphan-guaiFENesin  (MUCINEX  DM MAXIMUM STRENGTH) 60-1200 MG TB12 Take 1 tablet by mouth 2 (two) times daily. Patient not taking: No sig reported 09/18/23   Iola Lukes, FNP     Allergies:   No Known Allergies  Social History:   Social History   Socioeconomic History   Marital status: Married    Spouse name: Not on file   Number of children: Not on file   Years of education: Not on file   Highest education level: Not on file  Occupational History   Not on file  Tobacco Use   Smoking status: Never   Smokeless tobacco: Never  Vaping Use   Vaping status: Never Used  Substance and Sexual Activity   Alcohol use: Not Currently   Drug use: No   Sexual activity: Yes  Other Topics Concern   Not on file  Social History Narrative   Right  handed   Lives with wife   Retired   No caffeine   Two level home   Social Drivers of Corporate investment banker Strain: Not on file  Food Insecurity: Low Risk  (10/27/2022)   Received from [facility]   Hunger Vital Sign    Within the past 12 months, you worried that your food would run out before you got money to buy more: Never true    Within the past 12 months, the food you bought just didn't last and you didn't have money to get more. : Never true  Transportation Needs: Not on file (10/27/2022)  Physical Activity: Not on file  Stress: Not on file  Social Connections: Not on file  Intimate Partner Violence: Unknown (08/22/2021)   Received from [facility]   Intimate Partner Violence    Fear of Current or Ex-Partner: Not on file     Family History:   The patient's family history includes Bipolar disorder in his brother; Cancer in an other family member; Colon cancer in his mother; Lung cancer in his father. There is no history of Allergic rhinitis, Angioedema, Asthma, or Eczema.    ROS:  Please see the history of present illness.  All other ROS reviewed and negative.     Physical Exam/Data: Vitals:   11/11/23 0918 11/11/23 1100 11/11/23 1240 11/11/23 1429  BP: 121/70  (!) 107/90 133/75  Pulse: 68 61 62 70  Resp: 15  10 20   Temp: 97.7 F (36.5 C)   98.3 F (36.8 C)  TempSrc: Oral   Oral  SpO2: 100% 95% 97% 94%  Weight:      Height:       No intake or output data in the 24 hours ending 11/11/23 1609    11/10/2023    2:13 PM 10/05/2023   12:23 PM 10/01/2023    9:50 AM  Last 3 Weights  Weight (lbs) 184 lb 1.4 oz 184 lb 184 lb 9.6 oz  Weight (kg) 83.5 kg 83.462 kg 83.734 kg     Body mass index is 24.97 kg/m.  General: Well developed, well nourished, in no acute distress. Head: Normocephalic, atraumatic, sclera non-icteric, no xanthomas, nares are without discharge. Neck: Negative for carotid bruits. JVP not elevated. Lungs: Clear bilaterally to auscultation without  wheezes, rales, or rhonchi. Breathing is unlabored. Heart: RRR S1 S2 without murmurs, rubs, or gallops.  Abdomen: Soft, non-tender, non-distended with normoactive bowel sounds. No rebound/guarding. Extremities: No clubbing or cyanosis. No edema. Distal pedal pulses are 2+ and equal bilaterally. Neuro: Alert and oriented  X 3. Moves all extremities spontaneously. Psych:  Responds to questions appropriately with a normal affect.   EKG:  The ECG that was done yesterday was personally reviewed and demonstrates NSR, nonspecific TWI avL seen back in 2021 as well. No acute changes.  Relevant CV Studies: 2d echo 06/2023   1. Left ventricular ejection fraction, by estimation, is 50 to 55%. The  left ventricle has low normal function. The left ventricle has no regional  wall motion abnormalities. Left ventricular diastolic parameters are  consistent with Grade I diastolic  dysfunction (impaired relaxation). The average left ventricular global  longitudinal strain is 14.7 %.   2. Right ventricular systolic function is normal. The right ventricular  size is normal. Tricuspid regurgitation signal is inadequate for assessing  PA pressure.   3. The mitral valve is normal in structure. No evidence of mitral valve  regurgitation.   4. Eccentric jet towards the anterior mitral valve leaflet. The aortic  valve is normal in structure. Aortic valve regurgitation is moderate.   5. The inferior vena cava is normal in size with greater than 50%  respiratory variability, suggesting right atrial pressure of 3 mmHg.   Conclusion(s)/Recommendation(s): AI appears mildly worse compared to prior  study 07/09/2022.   Cor CTA 06/2022 FINDINGS: Quality: Good, HR 74   Coronary calcium  score: The patient's coronary artery calcium  score is 1027, which places the patient in the 84th percentile.   Coronary arteries: Normal coronary origins.  Right dominance.   Right Coronary Artery: Dominant. Minimal mixed distal  1-24% stenosis.   Left Main Coronary Artery: Normal. Bifurcates into the LAD and LCx arteries.   Left Anterior Descending Coronary Artery: Anterior artery that extends around the apex. The proximal vessel is heavily calcified with probably mild to moderate stenosis (50-69%). High D1 branch which bifurcates and has minimal 1-24% proximal stenosis.   Left Circumflex Artery: AV groove vessel with minimal mixed 1-24% proximal stenosis. There is a large OM branch which bifurcates and is without disease.   Aorta: Normal size, 32 mm at the mid ascending aorta (level of the PA bifurcation) measured double oblique. Aortic atherosclerosis. No dissection.   Aortic Valve: Trileaflet. No calcifications.   Other findings:   Normal pulmonary vein drainage into the left atrium.   Normal left atrial appendage without a thrombus.   Normal size of the pulmonary artery.   IMPRESSION: 1. Moderate mixed CAD, CADRADS = 3. CT FFR will be performed and reported separately.   2. Coronary calcium  score of 1027. This was 84th percentile for age and sex matched control.   3. Normal coronary origin with right dominance.   4. Aortic atherosclerosis   FINDINGS: FFRct analysis was performed on the original cardiac CT angiogram dataset. Diagrammatic representation of the FFRct analysis is provided in a separate PDF document in PACS. This dictation was created using the PDF document and an interactive 3D model of the results. 3D model is not available in the EMR/PACS. Normal FFR range is >0.80.   1. Left Main:  No significant stenosis. FFR = 1.00   2. LAD: No significant stenosis. Proximal FFR = 0.96, Mid FFR = 0.85, Distal FFR = 0.80 3. LCX: No significant stenosis. Proximal FFR = 0.98, Distal FFR = 0.96 4. RCA: No significant stenosis. Proximal FFR = 1.00, Mid FFR = 0.96, Distal FFR = 0.92   IMPRESSION: 1.  CT FFR analysis did not show any significant stenosis (<0.75).     Electronically  Signed   By:  Vinie JAYSON Maxcy M.D.   On: 06/30/2022 17:02    Laboratory Data: High Sensitivity Troponin:  No results for input(s): TROPONINIHS in the last 720 hours.    Chemistry Recent Labs  Lab 11/10/23 1415  NA 141  K 4.3  CL 107  CO2 23  GLUCOSE 101*  BUN 19  CREATININE 1.22  CALCIUM  9.4  GFRNONAA >60  ANIONGAP 10    No results for input(s): PROT, ALBUMIN, AST, ALT, ALKPHOS, BILITOT in the last 168 hours. Lipids No results for input(s): CHOL, TRIG, HDL, LABVLDL, LDLCALC, CHOLHDL in the last 168 hours. Hematology Recent Labs  Lab 11/10/23 1415  WBC 5.5  RBC 4.57  HGB 13.8  HCT 39.7  MCV 86.9  MCH 30.2  MCHC 34.8  RDW 13.2  PLT 105*   Thyroid  No results for input(s): TSH, FREET4 in the last 168 hours. BNP Recent Labs  Lab 11/10/23 1450  PROBNP 103.0    DDimer  Recent Labs  Lab 11/10/23 1450  DDIMER 0.30    Radiology/Studies:  CXR NAD   Assessment and Plan:  1. Atypical left shoulder pain, chest pain, nausea, dizziness, shallow breathing - thus far reassuring cardiac workup by normal troponins, d-dimer, BNP, telemetry, and EKG - was transferred to [facility] for consideration of cardiac cath - per discussion with MD, will pursue LHC in AM to definitely rule out progression of known CAD. If stable from prior studies, anticipate medical management. Possible symptoms could be noncardiac - etiology dizziness unclear -> telemetry stable, no focal neurologic symptoms, BP variable - will hold metoprolol  in case contributing and follow overnight. Check TSH in AM - offer PRN Zofran  for episodic nausea, no emesis - will order f/u echo to ensure no progression of AI - if able to be done inpatient, will need to cancel OP surveillance study at discharge - consider outpatient pulmonology referral for chronic asthma, shallow breathing issues - continue PPI  2. CAD  - continue ASA - continue rosuvastatin  20mg  daily  3.  Anxiety/depression/prior veteran - continue home Buspar , Lamictal ,sertraline - did miss AM doses so will move up next dose to now - continue PRN hydroxyzine , trazodone   4. Asthma - exam reassuring, no acute wheezing - consider pulmonology evaluation as outpatient for persistent sensation of shallow breathing since illness earlier this year - continue PRN albuterol , PRN budesonide , PRN flonase , scheduled antihistamine (formulary sub loratadine ), formulary sub for Symbicort    DVT PPx: Heparin  SQ  Code Status: Full Code  Severity of Illness: The appropriate patient status for this patient is INPATIENT. Inpatient status is judged to be reasonable and necessary in order to provide the required intensity of service to ensure the patient's safety. The patient's presenting symptoms, physical exam findings, and initial radiographic and laboratory data in the context of their chronic comorbidities is felt to place them at high risk for further clinical deterioration. Furthermore, it is not anticipated that the patient will be medically stable for discharge from the hospital within 2 midnights of admission.   * I certify that at the point of admission it is my clinical judgment that the patient will require inpatient hospital care spanning beyond 2 midnights from the point of admission due to high intensity of service, high risk for further deterioration and high frequency of surveillance required.*   For questions or updates, please contact [facility] Please consult [email or web address] for contact info under     Signed, Irine Heminger N Katera Rybka, PA-C  11/11/2023 4:09 PM

## 2023-11-11 NOTE — Progress Notes (Signed)
 Pt arrived from [facility]...., A/ox .4.SABRApt denies any pain, MD aware,CCMD called. CHG bath given,no further needs at this time

## 2023-11-12 ENCOUNTER — Encounter (HOSPITAL_COMMUNITY)
Admission: EM | Disposition: A | Discharge status: Home / Self Care | Payer: Self-pay | Source: Home / Self Care | Attending: Cardiology

## 2023-11-12 ENCOUNTER — Other Ambulatory Visit (HOSPITAL_COMMUNITY)

## 2023-11-12 ENCOUNTER — Encounter (HOSPITAL_COMMUNITY): Payer: Self-pay | Admitting: Cardiology

## 2023-11-12 DIAGNOSIS — R0789 Other chest pain: Secondary | ICD-10-CM | POA: Diagnosis not present

## 2023-11-12 DIAGNOSIS — F32A Depression, unspecified: Secondary | ICD-10-CM | POA: Diagnosis not present

## 2023-11-12 DIAGNOSIS — I129 Hypertensive chronic kidney disease with stage 1 through stage 4 chronic kidney disease, or unspecified chronic kidney disease: Secondary | ICD-10-CM | POA: Diagnosis not present

## 2023-11-12 DIAGNOSIS — D696 Thrombocytopenia, unspecified: Secondary | ICD-10-CM | POA: Diagnosis not present

## 2023-11-12 DIAGNOSIS — R0609 Other forms of dyspnea: Principal | ICD-10-CM

## 2023-11-12 HISTORY — PX: LEFT HEART CATH AND CORONARY ANGIOGRAPHY: CATH118249

## 2023-11-12 LAB — CBC
HCT: 39.2 % (ref 39.0–52.0)
Hemoglobin: 13.6 g/dL (ref 13.0–17.0)
MCH: 30.4 pg (ref 26.0–34.0)
MCHC: 34.7 g/dL (ref 30.0–36.0)
MCV: 87.5 fL (ref 80.0–100.0)
Platelets: 106 K/uL — ABNORMAL LOW (ref 150–400)
RBC: 4.48 MIL/uL (ref 4.22–5.81)
RDW: 13.2 % (ref 11.5–15.5)
WBC: 5.5 K/uL (ref 4.0–10.5)
nRBC: 0 % (ref 0.0–0.2)

## 2023-11-12 LAB — LIPASE, BLOOD: Lipase: 41 U/L (ref 11–51)

## 2023-11-12 LAB — BASIC METABOLIC PANEL WITH GFR
Anion gap: 8 (ref 5–15)
BUN: 18 mg/dL (ref 8–23)
CO2: 26 mmol/L (ref 22–32)
Calcium: 9.1 mg/dL (ref 8.9–10.3)
Chloride: 105 mmol/L (ref 98–111)
Creatinine, Ser: 1.38 mg/dL — ABNORMAL HIGH (ref 0.61–1.24)
GFR, Estimated: 54 mL/min — ABNORMAL LOW (ref >60)
Glucose, Bld: 106 mg/dL — ABNORMAL HIGH (ref 70–99)
Potassium: 4.4 mmol/L (ref 3.5–5.1)
Sodium: 139 mmol/L (ref 135–145)

## 2023-11-12 SURGERY — LEFT HEART CATH AND CORONARY ANGIOGRAPHY
Anesthesia: LOCAL

## 2023-11-12 MED ORDER — VERAPAMIL HCL 2.5 MG/ML IV SOLN
INTRAVENOUS | Status: DC | PRN
  Administered 2023-11-12: 10 mL via INTRA_ARTERIAL

## 2023-11-12 MED ORDER — LIDOCAINE HCL (PF) 1 % IJ SOLN
INTRAMUSCULAR | Status: DC | PRN
Start: 2023-11-12 — End: 2023-11-12
  Administered 2023-11-12: 2 mL

## 2023-11-12 MED ORDER — VERAPAMIL HCL 2.5 MG/ML IV SOLN
INTRAVENOUS | Status: AC
  Filled 2023-11-12: qty 2

## 2023-11-12 MED ORDER — OXYCODONE HCL 5 MG PO TABS
5.0000 mg | ORAL_TABLET | Freq: Once | ORAL | Status: AC
  Administered 2023-11-12: 5 mg via ORAL
  Filled 2023-11-12: qty 1

## 2023-11-12 MED ORDER — MIDAZOLAM HCL 2 MG/2ML IJ SOLN
INTRAMUSCULAR | Status: AC
  Filled 2023-11-12: qty 2

## 2023-11-12 MED ORDER — LIDOCAINE HCL (PF) 1 % IJ SOLN
INTRAMUSCULAR | Status: AC
  Filled 2023-11-12: qty 30

## 2023-11-12 MED ORDER — IOHEXOL 350 MG/ML SOLN
INTRAVENOUS | Status: DC | PRN
  Administered 2023-11-12: 40 mL

## 2023-11-12 MED ORDER — SODIUM CHLORIDE 0.9% FLUSH
3.0000 mL | INTRAVENOUS | Status: DC | PRN
Start: 2023-11-12 — End: 2023-11-13

## 2023-11-12 MED ORDER — HEPARIN SODIUM (PORCINE) 1000 UNIT/ML IJ SOLN
INTRAMUSCULAR | Status: DC | PRN
  Administered 2023-11-12: 4500 [IU] via INTRAVENOUS

## 2023-11-12 MED ORDER — FENTANYL CITRATE (PF) 100 MCG/2ML IJ SOLN
INTRAMUSCULAR | Status: DC | PRN
  Administered 2023-11-12 (×2): 25 ug via INTRAVENOUS

## 2023-11-12 MED ORDER — HEPARIN SODIUM (PORCINE) 1000 UNIT/ML IJ SOLN
INTRAMUSCULAR | Status: AC
  Filled 2023-11-12: qty 10

## 2023-11-12 MED ORDER — FENTANYL CITRATE (PF) 100 MCG/2ML IJ SOLN
INTRAMUSCULAR | Status: AC
  Filled 2023-11-12: qty 2

## 2023-11-12 MED ORDER — MIDAZOLAM HCL 2 MG/2ML IJ SOLN
INTRAMUSCULAR | Status: DC | PRN
  Administered 2023-11-12: 2 mg via INTRAVENOUS
  Administered 2023-11-12: 1 mg via INTRAVENOUS

## 2023-11-12 MED ORDER — SODIUM CHLORIDE 0.9 % IV SOLN: 250.0000 mL | INTRAVENOUS | Status: DC | PRN

## 2023-11-12 SURGICAL SUPPLY — 7 items
CATH INFINITI AMBI 5FR TG (CATHETERS) IMPLANT
DEVICE RAD COMP TR BAND LRG (VASCULAR PRODUCTS) IMPLANT
ELECT DEFIB PAD ADLT CADENCE (PAD) IMPLANT
GLIDESHEATH SLEND A-KIT 6F 22G (SHEATH) IMPLANT
GUIDEWIRE INQWIRE 1.5J.035X260 (WIRE) IMPLANT
PACK CARDIAC CATHETERIZATION (CUSTOM PROCEDURE TRAY) ×1 IMPLANT
SET ATX-X65L (MISCELLANEOUS) IMPLANT

## 2023-11-12 NOTE — Discharge Summary (Addendum)
 Discharge Summary   Patient ID: Arthur Moore MRN: [id number]; DOB: 1950/06/09  Admit date: 11/10/2023 Discharge date: 11/13/2023  PCP:  Dayna Motto, DO   [facility] Providers Cardiologist:  Annabella Scarce, MD      Discharge Diagnoses  Principal Problem:   Chest pain Active Problems:   Unstable angina [facility])   Exertional dyspnea   Diagnostic Studies/Procedures   Left Heart Catheterization 11/12/23  Hemodynamic data: LVEDP 20 mmHg, mildly elevated.  No pressure gradient across the aortic valve.   Angiographic data: LM: Large-caliber vessel, mildly calcified. LAD: Large-caliber vessel, proximal and mid LAD have periarterial calcification, ostial 20% stenosis, a moderate-sized D1 with ostial 50% stenosis unchanged from cardiac catheterization in 2020.  There is mild scattered disease of the LAD. LCx: Moderate caliber vessel.  Minimal disease is evident. RCA: Large-caliber vessel, distal RCA has a 20% stenosis, large PL and PDA branches.      Impression and recommendations: No significant change in coronary artery since 2020 cardiac catheterization.  Evaluate for noncardiac causes of chest pain. _____________   History of Present Illness   Arthur Moore is a 73 y.o. male with past medical history of nonobstructive CAD, moderate AI (last echo 06/2023), TIA (carotid US  near normal 2023), liver fibrosis, anxiety, depression, HTN, HLD, arthritis, cervical/lumbar disc disease, asthma, aortic atherosclerosis, chronic appearing thrombocytopenia and CKD stage 2-3a.   Mr. Hollenbaugh was previously [place] for [place]urg[place], more recently last assessed in 06/2023 here as moderate. Prior holter in 2015 reportedly showed rare PACs/PVCs. He has had occasional testing for chest pain and/or SOB over the years including stress testing, heart cath and coronary CTAs. Cardiac cath 06/2018 showed mild nonobstructive CAD. Last coronary CTA 06/2022 showed moderate mixed  CAD (50-69% prox LAD, 1-24% D1, 1-24% AV groove Cx, 1-24% RCA, FFR negative, CAC 1027/87%ile, aortic atherosclerosis otherwise. Last echo 06/2023 showed EF 50-55% without RWMA, G1DD, moderate AI with follow-up planned 1 year.   Patient presented to med [facility] on 7/15 with posterior left shoulder pain, chest pain, shallow breathing, dizziness, and nausea. The shallow breathing has been going on for months, reporting he had an asthmatic bronchitis flare that took a few weeks and several allergist visits/medication adjustments to finally get over, but still deals with some shallow breathing/DOE. The chest pain is a vague chest ache that happens about half the month without any provoking features and is the same discomfort that prompted the last cardiac CT, no specific change in this symptom. On Monday he began having intermittent nausea which gradually evolved into Tuesday with posterior left shoulder pain and fairly persistent dizziness without vomiting or syncope. ED note indicated shoulder/chest pain was worse with exertion. He tells me the chest pain and shoulder pain are not changed with exertion, position changes, inspiration or palpation. The shoulder pain is essentially there all the time at a low level, with flares that come and go without rhyme or reason. He also describes left ankle swelling yesterday. This is not apparent on exam today. Labs show troponin neg x2, pBNP wnl, Cr 1.22 (baseline 1.2-1.4), plt 105 (prior range 95-145, last 126), d-dimer wnl. CXR NAD. VSS - BP range 107-149, HR/pOx wnl. EKG shows NSR without acute STT changes, nonspecific TWI avL seen back to 2021 as well. He received 25mg  hydyoxyzine last night otherwise has not gotten home meds yet today. He and his wife are requesting to only take the meds they brought from home due to cost and were informed that  they need to be given from the hospital pharmacy due to hospital policy.   Patient was admitted to the cardiology  service for further evaluation   Hospital Course    Patient was admitted to cardiology service at [facility] for further evaluation of left shoulder pain, chest pain, nausea.  Cardiac workup had been reassuring in the ED with normal troponins, D-dimer, BNP, EKG.  As patient has known moderate LAD stenosis, recommended that patient undergo left heart catheterization in a.m. to definitively rule out progression of CAD.  He underwent cardiac catheterization on 7/17 with Dr. Ladona.  There was no significant change in coronary anatomy since cardiac catheterization in 2020.  Mild, nonobstructive disease noted.  Recommended evaluation for noncardiac causes of chest pain. On 7/16, lipase had been minimally elevated to 52.  Was down to 41 prior to discharge.  After cardiac catheterization, patient developed a hematoma around the radial cath side. Pressure was applied and TR band repositioned. He was admitted for observation overnight. On 7/18, patient was seen by Dr. Raford and was deemed stable for discharge.   Left shoulder pain  CAD  - As above, cath this admission showed mild nonobstructive CAD.  Recommended evaluation for noncardiac causes of chest pain.  Instructed patient to follow-up with his PCP for further evaluation - Continue aspirin  81 mg daily, Crestor  20 mg daily, metoprolol  tartrate 12.5 mg twice daily - Discussed radial site care and provided written instructions on AVS. He did have a right wrist hematoma after cath that resolved with compression  - Patient requested medication for ongoing shoulder pain. Per Dr. Raford- prescribed 10 tablets of tramadol  50 mg   Aortic regurgitation  - This was moderate on most recent echocardiogram from 06/2023 - Has repeat echocardiogram scheduled for 12/31/2023  Anxiety  Depression  - Continue home regiment   Asthma  - Continue home inhalers   Has follow up with cardiology 8/1    Did the patient have an acute coronary syndrome (MI,  NSTEMI, STEMI, etc) this admission?:  No                               Did the patient have a percutaneous coronary intervention (stent / angioplasty)?:  No.      _____________  Discharge Vitals Blood pressure 126/67, pulse 75, temperature 98.3 F (36.8 C), temperature source Oral, resp. rate 16, height 6' (1.829 m), weight 83.5 kg, SpO2 94%.  Filed Weights   11/10/23 1413  Weight: 83.5 kg    Labs & Radiologic Studies  CBC Recent Labs    11/11/23 1755 11/12/23 0323  WBC 5.1 5.5  HGB 14.2 13.6  HCT 41.3 39.2  MCV 87.7 87.5  PLT 102* 106*   Basic Metabolic Panel Recent Labs    92/83/74 1755 11/12/23 0323  NA 139 139  K 3.7 4.4  CL 108 105  CO2 22 26  GLUCOSE 139* 106*  BUN 15 18  CREATININE 1.27* 1.38*  CALCIUM  9.0 9.1   Liver Function Tests Recent Labs    11/11/23 1755  AST 38  ALT 38  ALKPHOS 50  BILITOT 1.1  PROT 6.1*  ALBUMIN 3.8   Recent Labs    11/11/23 1755 11/12/23 0323  LIPASE 52* 41   High Sensitivity Troponin:   No results for input(s): TROPONINIHS in the last 720 hours.  Recent Labs  Lab 11/10/23 1415 11/10/23 1707  TRNPT 9 <15  BNP Invalid input(s): POCBNP Recent Labs    11/10/23 1450  PROBNP 103.0    No results for input(s): BNP in the last 72 hours.  D-Dimer Recent Labs    11/10/23 1450  DDIMER 0.30   Hemoglobin A1C No results for input(s): HGBA1C in the last 72 hours. Fasting Lipid Panel No results for input(s): CHOL, HDL, LDLCALC, TRIG, CHOLHDL, LDLDIRECT in the last 72 hours. No results found for: LIPOA  Thyroid  Function Tests Recent Labs    11/11/23 1755  TSH 1.310   _____________  CARDIAC CATHETERIZATION Result Date: 11/12/2023 Images from the original result were not included. Cardiac Catheterization 11/12/23: Hemodynamic data: LVEDP 20 mmHg, mildly elevated.  No pressure gradient across the aortic valve. Angiographic data: LM: Large-caliber vessel, mildly calcified. LAD:  Large-caliber vessel, proximal and mid LAD have periarterial calcification, ostial 20% stenosis, a moderate-sized D1 with ostial 50% stenosis unchanged from cardiac catheterization in 2020.  There is mild scattered disease of the LAD. LCx: Moderate caliber vessel.  Minimal disease is evident. RCA: Large-caliber vessel, distal RCA has a 20% stenosis, large PL and PDA branches. Impression and recommendations: No significant change in coronary artery since 2020 cardiac catheterization.  Evaluate for noncardiac causes of chest pain.   DG Chest 2 View Result Date: 11/10/2023 CLINICAL DATA:  Chest pain EXAM: CHEST - 2 VIEW COMPARISON:  None Available. FINDINGS: The heart size and mediastinal contours are within normal limits. No consolidation, pneumothorax or effusion. No edema. The visualized skeletal structures are unremarkable. Degenerative changes are seen along the spine. IMPRESSION: No acute cardiopulmonary disease. Electronically Signed   By: Ranell Bring M.D.   On: 11/10/2023 14:27    Disposition Pt is being discharged home today in good condition.   Follow-up Plans & Appointments  Discharge Instructions     Diet - low sodium heart healthy   Complete by: As directed    Discharge instructions   Complete by: As directed    Radial Site Care Refer to this sheet in the next few weeks. These instructions provide you with information on caring for yourself after your procedure. Your caregiver may also give you more specific instructions. Your treatment has been planned according to current medical practices, but problems sometimes occur. Call your caregiver if you have any problems or questions after your procedure. HOME CARE INSTRUCTIONS You may shower the day after the procedure. Remove the bandage (dressing) and gently wash the site with plain soap and water. Gently pat the site dry.  Do not apply powder or lotion to the site.  Do not submerge the affected site in water for 3 to 5 days.  Inspect  the site at least twice daily.  Do not flex or bend the affected arm for 24 hours.  No lifting over 5 pounds (2.3 kg) for 5 days after your procedure.  Do not drive home if you are discharged the same day of the procedure. Have someone else drive you.  You may drive 24 hours after the procedure unless otherwise instructed by your caregiver.  What to expect: Any bruising will usually fade within 1 to 2 weeks.  Blood that collects in the tissue (hematoma) may be painful to the touch. It should usually decrease in size and tenderness within 1 to 2 weeks.  SEEK IMMEDIATE MEDICAL CARE IF: You have unusual pain at the radial site.  You have redness, warmth, swelling, or pain at the radial site.  You have drainage (other than a small amount of blood  on the dressing).  You have chills.  You have a fever or persistent symptoms for more than 72 hours.  You have a fever and your symptoms suddenly get worse.  Your arm becomes pale, cool, tingly, or numb.  You have heavy bleeding from the site. Hold pressure on the site.   Increase activity slowly   Complete by: As directed        Discharge Medications Allergies as of 11/13/2023   No Known Allergies      Medication List     STOP taking these medications    chlorpheniramine-HYDROcodone 10-8 MG/5ML Commonly known as: TUSSIONEX   Mucinex  DM Maximum Strength 60-1200 MG Tb12       TAKE these medications    albuterol  108 (90 Base) MCG/ACT inhaler Commonly known as: VENTOLIN  HFA Inhale 2 puffs into the lungs every 4 (four) hours as needed for wheezing or shortness of breath.   albuterol  (2.5 MG/3ML) 0.083% nebulizer solution Commonly known as: PROVENTIL  Take 2.5 mg by nebulization as needed for wheezing or shortness of breath.   aspirin  EC 81 MG tablet Take 81 mg by mouth daily.   budesonide  0.25 MG/2ML nebulizer solution Commonly known as: PULMICORT  Take 2 mLs (0.25 mg total) by nebulization every 4 (four) hours as needed (During  flare ups).   budesonide -formoterol  160-4.5 MCG/ACT inhaler Commonly known as: SYMBICORT  Inhale 2 puffs into the lungs 2 (two) times daily. With spacer (empty lungs)   busPIRone  10 MG tablet Commonly known as: BUSPAR  Take 10 mg by mouth 2 (two) times daily.   cetirizine  10 MG tablet Commonly known as: ZyrTEC  Allergy Take 1 tablet (10 mg total) by mouth daily as needed (can take an extra dose during flare ups). What changed: when to take this   Cholecalciferol  125 MCG (5000 UT) Tabs Take 5,000 Units by mouth every evening.   fluticasone  50 MCG/ACT nasal spray Commonly known as: FLONASE  Place 2 sprays into both nostrils daily. 1-2 sprays each nostril 1 (ONE) time per day. What changed:  when to take this reasons to take this   hydrOXYzine  10 MG tablet Commonly known as: ATARAX  Take 10 mg by mouth as needed for anxiety.   lamoTRIgine  200 MG tablet Commonly known as: LAMICTAL  Take 1 tablet (200 mg total) by mouth 2 (two) times daily.   metoprolol  tartrate 25 MG tablet Commonly known as: LOPRESSOR  TAKE 1/2 TABLET BY MOUTH TWICE A DAY   Nebulizer Mask Adult Misc 1 Device by Does not apply route as directed.   nitroGLYCERIN  0.4 MG SL tablet Commonly known as: NITROSTAT  Place 0.4 mg under the tongue every 5 (five) minutes as needed for chest pain.   pantoprazole  40 MG tablet Commonly known as: Protonix  Take 1 tablet (40 mg total) by mouth 2 (two) times daily. What changed: when to take this   rosuvastatin  40 MG tablet Commonly known as: CRESTOR  Take 20 mg by mouth every evening.   sertraline  100 MG tablet Commonly known as: ZOLOFT  Take 100 mg by mouth in the morning.   Spacer/Aero-Holding [employer] 1 Device by Does not apply route as directed.   traMADol  50 MG tablet Commonly known as: Ultram  Take 1 tablet (50 mg total) by mouth every 12 (twelve) hours as needed for severe pain (pain score 7-10).         Outstanding Labs/Studies   Duration of  Discharge Encounter: APP Time: 20 minutes   Signed, Rollo FABIENE Louder, PA-C 11/13/2023, 9:50 AM

## 2023-11-12 NOTE — Progress Notes (Signed)
 Rounding Note   Patient Name: Arthur Moore Date of Encounter: 11/12/2023  [facility] Cardiologist: Annabella Scarce, MD   Subjective Shoulder a little better after oxycodone .    Scheduled Meds:  aspirin  EC  81 mg Oral Daily   budesonide -formoterol   2 puff Inhalation BID   busPIRone   10 mg Oral BID   cholecalciferol   5,000 Units Oral QPM   heparin   5,000 Units Subcutaneous Q8H   lamoTRIgine   200 mg Oral BID   loratadine   10 mg Oral Daily   pantoprazole   40 mg Oral Daily   rosuvastatin   20 mg Oral QPM   sertraline   100 mg Oral Daily   sodium chloride  flush  3 mL Intravenous Q12H   traZODone   50 mg Oral QHS   Continuous Infusions:  sodium chloride      sodium chloride  1 mL/kg/hr (11/12/23 0724)   PRN Meds: sodium chloride , acetaminophen , albuterol , budesonide , diclofenac  Sodium, fluticasone , hydrOXYzine , nitroGLYCERIN , ondansetron  (ZOFRAN ) IV, sodium chloride  flush   Vital Signs  Vitals:   11/11/23 1626 11/11/23 1900 11/11/23 2300 11/12/23 0300  BP: (!) 153/75 114/61 139/67 103/63  Pulse: 60 67 62 72  Resp: 17 19 18 15   Temp: 98.1 F (36.7 C) 98.1 F (36.7 C) 98.2 F (36.8 C) 98.2 F (36.8 C)  TempSrc: Oral Oral Oral Oral  SpO2: 95% 90% 94% 97%  Weight:      Height:        Intake/Output Summary (Last 24 hours) at 11/12/2023 0902 Last data filed at 11/12/2023 0600 Gross per 24 hour  Intake 20.88 ml  Output --  Net 20.88 ml      11/10/2023    2:13 PM 10/05/2023   12:23 PM 10/01/2023    9:50 AM  Last 3 Weights  Weight (lbs) 184 lb 1.4 oz 184 lb 184 lb 9.6 oz  Weight (kg) 83.5 kg 83.462 kg 83.734 kg      Telemetry SInus [email or web address].  No events - Personally Reviewed  ECG  N/a - Personally Reviewed  Physical Exam  VS:  BP 103/63 (BP Location: Left Arm)   Pulse 72   Temp 98.2 F (36.8 C) (Oral)   Resp 15   Ht 6' (1.829 m)   Wt 83.5 kg   SpO2 97%   BMI 24.97 kg/m  , BMI Body mass index is 24.97 kg/m. GENERAL:  Well appearing HEENT:  Pupils equal round and reactive, fundi not visualized, oral mucosa unremarkable NECK:  No jugular venous distention, waveform within normal limits, carotid upstroke brisk and symmetric, no bruits, no thyromegaly LUNGS:  Clear to auscultation bilaterally HEART:  RRR.  PMI not displaced or sustained,S1 and S2 within normal limits, no S3, no S4, no clicks, no rubs, no murmurs ABD:  Flat, positive bowel sounds normal in frequency in pitch, no bruits, no rebound, no guarding, no midline pulsatile mass, no hepatomegaly, no splenomegaly EXT:  2 plus pulses throughout, no edema, no cyanosis no clubbing SKIN:  No rashes no nodules NEURO:  Cranial nerves II through XII grossly intact, motor grossly intact throughout PSYCH:  Cognitively intact, oriented to person place and time   Labs High Sensitivity Troponin:  No results for input(s): TROPONINIHS in the last 720 hours.   Chemistry Recent Labs  Lab 11/10/23 1415 11/11/23 1755 11/12/23 0323  NA 141 139 139  K 4.3 3.7 4.4  CL 107 108 105  CO2 23 22 26   GLUCOSE 101* 139* 106*  BUN 19 15 18   CREATININE 1.22 1.27*  1.38*  CALCIUM  9.4 9.0 9.1  PROT  --  6.1*  --   ALBUMIN  --  3.8  --   AST  --  38  --   ALT  --  38  --   ALKPHOS  --  50  --   BILITOT  --  1.1  --   GFRNONAA >60 >60 54*  ANIONGAP 10 9 8     Lipids No results for input(s): CHOL, TRIG, HDL, LABVLDL, LDLCALC, CHOLHDL in the last 168 hours.  Hematology Recent Labs  Lab 11/10/23 1415 11/11/23 1755 11/12/23 0323  WBC 5.5 5.1 5.5  RBC 4.57 4.71 4.48  HGB 13.8 14.2 13.6  HCT 39.7 41.3 39.2  MCV 86.9 87.7 87.5  MCH 30.2 30.1 30.4  MCHC 34.8 34.4 34.7  RDW 13.2 13.3 13.2  PLT 105* 102* 106*   Thyroid   Recent Labs  Lab 11/11/23 1755  TSH 1.310    BNP Recent Labs  Lab 11/10/23 1450  PROBNP 103.0    DDimer  Recent Labs  Lab 11/10/23 1450  DDIMER 0.30     Radiology  DG Chest 2 View Result Date: 11/10/2023 CLINICAL DATA:  Chest pain EXAM: CHEST  - 2 VIEW COMPARISON:  None Available. FINDINGS: The heart size and mediastinal contours are within normal limits. No consolidation, pneumothorax or effusion. No edema. The visualized skeletal structures are unremarkable. Degenerative changes are seen along the spine. IMPRESSION: No acute cardiopulmonary disease. Electronically Signed   By: Ranell Bring M.D.   On: 11/10/2023 14:27    Cardiac Studies Echo 07/13/23:  1. Left ventricular ejection fraction, by estimation, is 50 to 55%. The  left ventricle has low normal function. The left ventricle has no regional  wall motion abnormalities. Left ventricular diastolic parameters are  consistent with Grade I diastolic  dysfunction (impaired relaxation). The average left ventricular global  longitudinal strain is 14.7 %.   2. Right ventricular systolic function is normal. The right ventricular  size is normal. Tricuspid regurgitation signal is inadequate for assessing  PA pressure.   3. The mitral valve is normal in structure. No evidence of mitral valve  regurgitation.   4. Eccentric jet towards the anterior mitral valve leaflet. The aortic  valve is normal in structure. Aortic valve regurgitation is moderate.   5. The inferior vena cava is normal in size with greater than 50%  respiratory variability, suggesting right atrial pressure of 3 mmHg.   Cardiac CT-A 06/2022:   IMPRESSION: 1. Moderate mixed CAD, CADRADS = 3. CT FFR will be performed and reported separately.   2. Coronary calcium  score of 1027. This was 84th percentile for age and sex matched control.   3. Normal coronary origin with right dominance.   4. Aortic atherosclerosis  FFR 1. Left Main:  No significant stenosis. FFR = 1.00   2. LAD: No significant stenosis. Proximal FFR = 0.96, Mid FFR = 0.85, Distal FFR = 0.80 3. LCX: No significant stenosis. Proximal FFR = 0.98, Distal FFR = 0.96 4. RCA: No significant stenosis. Proximal FFR = 1.00, Mid FFR = 0.96, Distal FFR =  0.92   IMPRESSION: 1.  CT FFR analysis did not show any significant stenosis (<0.75).    Patient Profile   [name] is a 21M with non-obstructive CAD, mild-moderate aortic insufficiency, hypertension, hyperlipidemia, asthma, TIA, OSA not on CPAP, liver fibrosis, anxiety, and depression here with nausea and L shoulder discomfort.  Assessment & Plan     # L shoulder  discomfort:  # CAD:  Symptoms are ongoing and nonexertional.  Cardiac enzymes are negative.  EKG unremarkable.  I suspect that this is not ischemic.  However, he has known moderate LAD stenosis.  We will get a LHC to better quantify his disease. He struggles with health anxiety and having a moderate lesion on cardiac CT.  Continue aspirin , metoprolol , and rosuvastatin .    Informed Consent   Shared Decision Making/Informed Consent The risks [stroke (1 in 1000), death (1 in 1000), kidney failure [usually temporary] (1 in 500), bleeding (1 in 200), allergic reaction [possibly serious] (1 in 200)], benefits (diagnostic support and management of coronary artery disease) and alternatives of a cardiac catheterization were discussed in detail with Mr. Amores and he is willing to proceed.    # Aortic regurgitation:  Minimal ankle edema has now improved.  Repeat echo given his exertional dyspnea.       For questions or updates, please contact [facility] Please consult [email or web address] for contact info under     Signed, Annabella Scarce, MD  11/12/2023, 9:02 AM

## 2023-11-12 NOTE — Interval H&P Note (Signed)
 History and Physical Interval Note:  11/12/2023 1:07 PM  Arthur Moore  has presented today for surgery, with the diagnosis of chest pain.  The various methods of treatment have been discussed with the patient and family. After consideration of risks, benefits and other options for treatment, the patient has consented to  Procedure(s): LEFT HEART CATH AND CORONARY ANGIOGRAPHY (N/A) and possible coronary angioplasty as a surgical intervention.  The patient's history has been reviewed, patient examined, no change in status, stable for surgery.  I have reviewed the patient's chart and labs.  Questions were answered to the patient's satisfaction.     Gordy Bergamo

## 2023-11-12 NOTE — Discharge Summary (Incomplete Revision)
 Discharge Summary   Patient ID: Arthur Moore MRN: [id number]; DOB: 03-May-1950  Admit date: 11/10/2023 Discharge date: 11/12/2023  PCP:  Dayna Motto, DO   [facility] Providers Cardiologist:  Annabella Scarce, MD      Discharge Diagnoses  Principal Problem:   Chest pain Active Problems:   Unstable angina [facility])   Exertional dyspnea   Diagnostic Studies/Procedures   Left Heart Catheterization 11/12/23  Hemodynamic data: LVEDP 20 mmHg, mildly elevated.  No pressure gradient across the aortic valve.   Angiographic data: LM: Large-caliber vessel, mildly calcified. LAD: Large-caliber vessel, proximal and mid LAD have periarterial calcification, ostial 20% stenosis, a moderate-sized D1 with ostial 50% stenosis unchanged from cardiac catheterization in 2020.  There is mild scattered disease of the LAD. LCx: Moderate caliber vessel.  Minimal disease is evident. RCA: Large-caliber vessel, distal RCA has a 20% stenosis, large PL and PDA branches.      Impression and recommendations: No significant change in coronary artery since 2020 cardiac catheterization.  Evaluate for noncardiac causes of chest pain. _____________   History of Present Illness   Arthur Moore is a 73 y.o. male with past medical history of nonobstructive CAD, moderate AI (last echo 06/2023), TIA (carotid US  near normal 2023), liver fibrosis, anxiety, depression, HTN, HLD, arthritis, cervical/lumbar disc disease, asthma, aortic atherosclerosis, chronic appearing thrombocytopenia and CKD stage 2-3a.   Arthur Moore was previously [place] in [place] for [place]urg[place], more recently last assessed in 06/2023 here as moderate. Prior holter in 2015 reportedly showed rare PACs/PVCs. He has had occasional testing for chest pain and/or SOB over the years including stress testing, heart cath and coronary CTAs. Cardiac cath 06/2018 showed mild nonobstructive CAD. Last coronary CTA 06/2022 showed moderate mixed  CAD (50-69% prox LAD, 1-24% D1, 1-24% AV groove Cx, 1-24% RCA, FFR negative, CAC 1027/87%ile, aortic atherosclerosis otherwise. Last echo 06/2023 showed EF 50-55% without RWMA, G1DD, moderate AI with follow-up planned 1 year.   Patient presented to med [facility] on 7/15 with posterior left shoulder pain, chest pain, shallow breathing, dizziness, and nausea. The shallow breathing has been going on for months, reporting he had an asthmatic bronchitis flare that took a few weeks and several allergist visits/medication adjustments to finally get over, but still deals with some shallow breathing/DOE. The chest pain is a vague chest ache that happens about half the month without any provoking features and is the same discomfort that prompted the last cardiac CT, no specific change in this symptom. On Monday he began having intermittent nausea which gradually evolved into Tuesday with posterior left shoulder pain and fairly persistent dizziness without vomiting or syncope. ED note indicated shoulder/chest pain was worse with exertion. He tells me the chest pain and shoulder pain are not changed with exertion, position changes, inspiration or palpation. The shoulder pain is essentially there all the time at a low level, with flares that come and go without rhyme or reason. He also describes left ankle swelling yesterday. This is not apparent on exam today. Labs show troponin neg x2, pBNP wnl, Cr 1.22 (baseline 1.2-1.4), plt 105 (prior range 95-145, last 126), d-dimer wnl. CXR NAD. VSS - BP range 107-149, HR/pOx wnl. EKG shows NSR without acute STT changes, nonspecific TWI avL seen back to 2021 as well. He received 25mg  hydyoxyzine last night otherwise has not gotten home meds yet today. He and his wife are requesting to only take the meds they brought from home due to cost and were informed that  they need to be given from the hospital pharmacy due to hospital policy.   Patient was admitted to the cardiology  service for further evaluation   Hospital Course    Patient was admitted to cardiology service at [facility] for further evaluation of left shoulder pain, chest pain, nausea.  Cardiac workup had been reassuring in the ED with normal troponins, D-dimer, BNP, EKG.  As patient has known moderate LAD stenosis, recommended that patient undergo left heart catheterization in a.m. to definitively rule out progression of CAD.  He underwent cardiac catheterization on 7/17 with Dr. Ladona.  There was no significant change in coronary anatomy since cardiac catheterization in 2020.  Mild, nonobstructive disease noted.  Recommended evaluation for noncardiac causes of chest pain. On 7/16, lipase had been minimally elevated to 52.  Was down to 41 prior to discharge.  After cardiac catheterization, patient developed a hematoma around the radial cath side. Pressure was applied and TR band repositioned. ***   patient was cleared for discharge.   Left shoulder pain  CAD  - As above, cath this admission showed mild nonobstructive CAD.  Recommended evaluation for noncardiac causes of chest pain.  Instructed patient to follow-up with his PCP for further evaluation - Continue aspirin  81 mg daily, Crestor  20 mg daily, metoprolol  tartrate 12.5 mg twice daily - Discussed radial site care and provided written instructions on AVS   Aortic regurgitation  - This was moderate on most recent echocardiogram from 06/2023 - Has repeat echocardiogram scheduled for 12/31/2023  Anxiety  Depression  - Continue home regiment   Asthma  - Continue home inhalers      Did the patient have an acute coronary syndrome (MI, NSTEMI, STEMI, etc) this admission?:  No                               Did the patient have a percutaneous coronary intervention (stent / angioplasty)?:  No.      _____________  Discharge Vitals Blood pressure 132/88, pulse 68, temperature 97.7 F (36.5 C), temperature source Oral, resp. rate 16, height  6' (1.829 m), weight 83.5 kg, SpO2 96%.  Filed Weights   11/10/23 1413  Weight: 83.5 kg    Labs & Radiologic Studies  CBC Recent Labs    11/11/23 1755 11/12/23 0323  WBC 5.1 5.5  HGB 14.2 13.6  HCT 41.3 39.2  MCV 87.7 87.5  PLT 102* 106*   Basic Metabolic Panel Recent Labs    92/83/74 1755 11/12/23 0323  NA 139 139  K 3.7 4.4  CL 108 105  CO2 22 26  GLUCOSE 139* 106*  BUN 15 18  CREATININE 1.27* 1.38*  CALCIUM  9.0 9.1   Liver Function Tests Recent Labs    11/11/23 1755  AST 38  ALT 38  ALKPHOS 50  BILITOT 1.1  PROT 6.1*  ALBUMIN 3.8   Recent Labs    11/11/23 1755 11/12/23 0323  LIPASE 52* 41   High Sensitivity Troponin:   No results for input(s): TROPONINIHS in the last 720 hours.  Recent Labs  Lab 11/10/23 1415 11/10/23 1707  TRNPT 9 <15    BNP Invalid input(s): POCBNP Recent Labs    11/10/23 1450  PROBNP 103.0    No results for input(s): BNP in the last 72 hours.  D-Dimer Recent Labs    11/10/23 1450  DDIMER 0.30   Hemoglobin A1C No results for input(s): HGBA1C in  the last 72 hours. Fasting Lipid Panel No results for input(s): CHOL, HDL, LDLCALC, TRIG, CHOLHDL, LDLDIRECT in the last 72 hours. No results found for: LIPOA  Thyroid  Function Tests Recent Labs    11/11/23 1755  TSH 1.310   _____________  CARDIAC CATHETERIZATION Result Date: 11/12/2023 Images from the original result were not included. Cardiac Catheterization 11/12/23: Hemodynamic data: LVEDP 20 mmHg, mildly elevated.  No pressure gradient across the aortic valve. Angiographic data: LM: Large-caliber vessel, mildly calcified. LAD: Large-caliber vessel, proximal and mid LAD have periarterial calcification, ostial 20% stenosis, a moderate-sized D1 with ostial 50% stenosis unchanged from cardiac catheterization in 2020.  There is mild scattered disease of the LAD. LCx: Moderate caliber vessel.  Minimal disease is evident. RCA: Large-caliber vessel,  distal RCA has a 20% stenosis, large PL and PDA branches. Impression and recommendations: No significant change in coronary artery since 2020 cardiac catheterization.  Evaluate for noncardiac causes of chest pain.   DG Chest 2 View Result Date: 11/10/2023 CLINICAL DATA:  Chest pain EXAM: CHEST - 2 VIEW COMPARISON:  None Available. FINDINGS: The heart size and mediastinal contours are within normal limits. No consolidation, pneumothorax or effusion. No edema. The visualized skeletal structures are unremarkable. Degenerative changes are seen along the spine. IMPRESSION: No acute cardiopulmonary disease. Electronically Signed   By: Ranell Bring M.D.   On: 11/10/2023 14:27    Disposition Pt is being discharged home today in good condition.  Follow-up Plans & Appointments    Discharge Medications Allergies as of 11/12/2023   No Known Allergies      Medication List     STOP taking these medications    chlorpheniramine-HYDROcodone 10-8 MG/5ML Commonly known as: TUSSIONEX   Mucinex  DM Maximum Strength 60-1200 MG Tb12       TAKE these medications    albuterol  108 (90 Base) MCG/ACT inhaler Commonly known as: VENTOLIN  HFA Inhale 2 puffs into the lungs every 4 (four) hours as needed for wheezing or shortness of breath.   albuterol  (2.5 MG/3ML) 0.083% nebulizer solution Commonly known as: PROVENTIL  Take 2.5 mg by nebulization as needed for wheezing or shortness of breath.   aspirin  EC 81 MG tablet Take 81 mg by mouth daily.   budesonide  0.25 MG/2ML nebulizer solution Commonly known as: PULMICORT  Take 2 mLs (0.25 mg total) by nebulization every 4 (four) hours as needed (During flare ups).   budesonide -formoterol  160-4.5 MCG/ACT inhaler Commonly known as: SYMBICORT  Inhale 2 puffs into the lungs 2 (two) times daily. With spacer (empty lungs)   busPIRone  10 MG tablet Commonly known as: BUSPAR  Take 10 mg by mouth 2 (two) times daily.   cetirizine  10 MG tablet Commonly known as:  ZyrTEC  Allergy Take 1 tablet (10 mg total) by mouth daily as needed (can take an extra dose during flare ups). What changed: when to take this   Cholecalciferol  125 MCG (5000 UT) Tabs Take 5,000 Units by mouth every evening.   fluticasone  50 MCG/ACT nasal spray Commonly known as: FLONASE  Place 2 sprays into both nostrils daily. 1-2 sprays each nostril 1 (ONE) time per day. What changed:  when to take this reasons to take this   hydrOXYzine  10 MG tablet Commonly known as: ATARAX  Take 10 mg by mouth as needed for anxiety.   lamoTRIgine  200 MG tablet Commonly known as: LAMICTAL  Take 1 tablet (200 mg total) by mouth 2 (two) times daily.   metoprolol  tartrate 25 MG tablet Commonly known as: LOPRESSOR  TAKE 1/2 TABLET BY MOUTH TWICE  A DAY   Nebulizer Mask Adult Misc 1 Device by Does not apply route as directed.   nitroGLYCERIN  0.4 MG SL tablet Commonly known as: NITROSTAT  Place 0.4 mg under the tongue every 5 (five) minutes as needed for chest pain.   pantoprazole  40 MG tablet Commonly known as: Protonix  Take 1 tablet (40 mg total) by mouth 2 (two) times daily. What changed: when to take this   rosuvastatin  40 MG tablet Commonly known as: CRESTOR  Take 20 mg by mouth every evening.   sertraline  100 MG tablet Commonly known as: ZOLOFT  Take 100 mg by mouth in the morning.   Spacer/Aero-Holding [employer] 1 Device by Does not apply route as directed.         Outstanding Labs/Studies   Duration of Discharge Encounter: APP Time: 20 minutes   Signed, Rollo FABIENE Louder, PA-C 11/12/2023, 2:37 PM

## 2023-11-12 NOTE — Progress Notes (Signed)
 Called for TR band hematoma.  Slight hematoma present proximal to TR band 1 inch proximal to band, manual pressure applied for a couple of  minutes. TR band repositioned proximally by 1/4 inch using a blood pressure cuff.  Band reinflated to 12 cc air.  Site level 1, hematoma softer, no additional swelling noted.  Sp02 97% as measured in right thumb.   Reverse barbeau is a A.   Band repositioned and reinflated at 15:05:00

## 2023-11-12 NOTE — Progress Notes (Signed)
   DC canceled due to development of a hematoma post cath. Will keep overnight for observation and plan to discharge in AM as long as wrist stable   Rollo FABIENE Louder, PA-C 11/12/2023 4:35 PM

## 2023-11-12 NOTE — Progress Notes (Addendum)
 Right radial sight swelling Cath lab up to assess right radial site, pressure being held.1ml air replaced as directed by cath lab staff. Caria Transue Jessup RN

## 2023-11-12 NOTE — Progress Notes (Signed)
 Went and evaluated his hematoma on his right arm. The pressure cuff had been removed. The skin was loose. Denied pain while arm was at rest but was tender to palpation. Denied numbness or tingling. Pulses were present. Will recheck in the am.  Signed,  Morse Clause, PA-C 11/12/2023, 7:40 PM

## 2023-11-12 NOTE — Progress Notes (Signed)
 Right radial site above TR band on elbow side continues to swell, bedside RN aware and cath lab made aware will be up to assess patient. Alanya Vukelich Jessup RN

## 2023-11-12 NOTE — H&P (View-Only) (Signed)
 Rounding Note   Patient Name: Arthur Moore Date of Encounter: 11/12/2023  [facility] Cardiologist: Arthur Scarce, MD   Subjective Shoulder a little better after oxycodone .    Scheduled Meds:  aspirin  EC  81 mg Oral Daily   budesonide -formoterol   2 puff Inhalation BID   busPIRone   10 mg Oral BID   cholecalciferol   5,000 Units Oral QPM   heparin   5,000 Units Subcutaneous Q8H   lamoTRIgine   200 mg Oral BID   loratadine   10 mg Oral Daily   pantoprazole   40 mg Oral Daily   rosuvastatin   20 mg Oral QPM   sertraline   100 mg Oral Daily   sodium chloride  flush  3 mL Intravenous Q12H   traZODone   50 mg Oral QHS   Continuous Infusions:  sodium chloride      sodium chloride  1 mL/kg/hr (11/12/23 0724)   PRN Meds: sodium chloride , acetaminophen , albuterol , budesonide , diclofenac  Sodium, fluticasone , hydrOXYzine , nitroGLYCERIN , ondansetron  (ZOFRAN ) IV, sodium chloride  flush   Vital Signs  Vitals:   11/11/23 1626 11/11/23 1900 11/11/23 2300 11/12/23 0300  BP: (!) 153/75 114/61 139/67 103/63  Pulse: 60 67 62 72  Resp: 17 19 18 15   Temp: 98.1 F (36.7 C) 98.1 F (36.7 C) 98.2 F (36.8 C) 98.2 F (36.8 C)  TempSrc: Oral Oral Oral Oral  SpO2: 95% 90% 94% 97%  Weight:      Height:        Intake/Output Summary (Last 24 hours) at 11/12/2023 0902 Last data filed at 11/12/2023 0600 Gross per 24 hour  Intake 20.88 ml  Output --  Net 20.88 ml      11/10/2023    2:13 PM 10/05/2023   12:23 PM 10/01/2023    9:50 AM  Last 3 Weights  Weight (lbs) 184 lb 1.4 oz 184 lb 184 lb 9.6 oz  Weight (kg) 83.5 kg 83.462 kg 83.734 kg      Telemetry SInus rhyhtm.  No events - Personally Reviewed  ECG  N/a - Personally Reviewed  Physical Exam  VS:  BP 103/63 (BP Location: Left Arm)   Pulse 72   Temp 98.2 F (36.8 C) (Oral)   Resp 15   Ht 6' (1.829 m)   Wt 83.5 kg   SpO2 97%   BMI 24.97 kg/m  , BMI Body mass index is 24.97 kg/m. GENERAL:  Well appearing HEENT:  Pupils equal round and reactive, fundi not visualized, oral mucosa unremarkable NECK:  No jugular venous distention, waveform within normal limits, carotid upstroke brisk and symmetric, no bruits, no thyromegaly LUNGS:  Clear to auscultation bilaterally HEART:  RRR.  PMI not displaced or sustained,S1 and S2 within normal limits, no S3, no S4, no clicks, no rubs, no murmurs ABD:  Flat, positive bowel sounds normal in frequency in pitch, no bruits, no rebound, no guarding, no midline pulsatile mass, no hepatomegaly, no splenomegaly EXT:  2 plus pulses throughout, no edema, no cyanosis no clubbing SKIN:  No rashes no nodules NEURO:  Cranial nerves II through XII grossly intact, motor grossly intact throughout PSYCH:  Cognitively intact, oriented to person place and time   Labs High Sensitivity Troponin:  No results for input(s): TROPONINIHS in the last 720 hours.   Chemistry Recent Labs  Lab 11/10/23 1415 11/11/23 1755 11/12/23 0323  NA 141 139 139  K 4.3 3.7 4.4  CL 107 108 105  CO2 23 22 26   GLUCOSE 101* 139* 106*  BUN 19 15 18   CREATININE 1.22 1.27*  1.38*  CALCIUM  9.4 9.0 9.1  PROT  --  6.1*  --   ALBUMIN  --  3.8  --   AST  --  38  --   ALT  --  38  --   ALKPHOS  --  50  --   BILITOT  --  1.1  --   GFRNONAA >60 >60 54*  ANIONGAP 10 9 8     Lipids No results for input(s): CHOL, TRIG, HDL, LABVLDL, LDLCALC, CHOLHDL in the last 168 hours.  Hematology Recent Labs  Lab 11/10/23 1415 11/11/23 1755 11/12/23 0323  WBC 5.5 5.1 5.5  RBC 4.57 4.71 4.48  HGB 13.8 14.2 13.6  HCT 39.7 41.3 39.2  MCV 86.9 87.7 87.5  MCH 30.2 30.1 30.4  MCHC 34.8 34.4 34.7  RDW 13.2 13.3 13.2  PLT 105* 102* 106*   Thyroid   Recent Labs  Lab 11/11/23 1755  TSH 1.310    BNP Recent Labs  Lab 11/10/23 1450  PROBNP 103.0    DDimer  Recent Labs  Lab 11/10/23 1450  DDIMER 0.30     Radiology  DG Chest 2 View Result Date: 11/10/2023 CLINICAL DATA:  Chest pain EXAM: CHEST  - 2 VIEW COMPARISON:  None Available. FINDINGS: The heart size and mediastinal contours are within normal limits. No consolidation, pneumothorax or effusion. No edema. The visualized skeletal structures are unremarkable. Degenerative changes are seen along the spine. IMPRESSION: No acute cardiopulmonary disease. Electronically Signed   By: Arthur Moore M.D.   On: 11/10/2023 14:27    Cardiac Studies Echo 07/13/23:  1. Left ventricular ejection fraction, by estimation, is 50 to 55%. The  left ventricle has low normal function. The left ventricle has no regional  wall motion abnormalities. Left ventricular diastolic parameters are  consistent with Grade I diastolic  dysfunction (impaired relaxation). The average left ventricular global  longitudinal strain is 14.7 %.   2. Right ventricular systolic function is normal. The right ventricular  size is normal. Tricuspid regurgitation signal is inadequate for assessing  PA pressure.   3. The mitral valve is normal in structure. No evidence of mitral valve  regurgitation.   4. Eccentric jet towards the anterior mitral valve leaflet. The aortic  valve is normal in structure. Aortic valve regurgitation is moderate.   5. The inferior vena cava is normal in size with greater than 50%  respiratory variability, suggesting right atrial pressure of 3 mmHg.   Cardiac CT-A 06/2022:   IMPRESSION: 1. Moderate mixed CAD, CADRADS = 3. CT FFR will be performed and reported separately.   2. Coronary calcium  score of 1027. This was 84th percentile for age and sex matched control.   3. Normal coronary origin with right dominance.   4. Aortic atherosclerosis  FFR 1. Left Main:  No significant stenosis. FFR = 1.00   2. LAD: No significant stenosis. Proximal FFR = 0.96, Mid FFR = 0.85, Distal FFR = 0.80 3. LCX: No significant stenosis. Proximal FFR = 0.98, Distal FFR = 0.96 4. RCA: No significant stenosis. Proximal FFR = 1.00, Mid FFR = 0.96, Distal FFR =  0.92   IMPRESSION: 1.  CT FFR analysis did not show any significant stenosis (<0.75).    Patient Profile   Arthur Moore is a 21M with non-obstructive CAD, mild-moderate aortic insufficiency, hypertension, hyperlipidemia, asthma, TIA, OSA not on CPAP, liver fibrosis, anxiety, and depression here with nausea and L shoulder discomfort.  Assessment & Plan     # L shoulder  discomfort:  # CAD:  Symptoms are ongoing and nonexertional.  Cardiac enzymes are negative.  EKG unremarkable.  I suspect that this is not ischemic.  However, he has known moderate LAD stenosis.  We will get a LHC to better quantify his disease. He struggles with health anxiety and having a moderate lesion on cardiac CT.  Continue aspirin , metoprolol , and rosuvastatin .    Informed Consent   Shared Decision Making/Informed Consent The risks [stroke (1 in 1000), death (1 in 1000), kidney failure [usually temporary] (1 in 500), bleeding (1 in 200), allergic reaction [possibly serious] (1 in 200)], benefits (diagnostic support and management of coronary artery disease) and alternatives of a cardiac catheterization were discussed in detail with Mr. Amores and he is willing to proceed.    # Aortic regurgitation:  Minimal ankle edema has now improved.  Repeat echo given his exertional dyspnea.       For questions or updates, please contact [facility] Please consult [email or web address] for contact info under     Signed, Arthur Scarce, MD  11/12/2023, 9:02 AM

## 2023-11-12 NOTE — Progress Notes (Signed)
   11/12/23 1417  Vitals  Temp 97.7 F (36.5 C)  Temp Source Oral  BP 132/88  MAP (mmHg) 102  BP Location Left Arm  BP Method Automatic  Patient Position (if appropriate) Lying  Pulse Rate 68  Pulse Rate Source Monitor  ECG Heart Rate 69  Resp 16  Level of Consciousness  Level of Consciousness Alert  MEWS COLOR  MEWS Score Color Green  Oxygen  Therapy  SpO2 96 %  O2 Device Room Air  Pain Assessment  Pain Scale 0-10  Pain Score 0  MEWS Score  MEWS Temp 0  MEWS Systolic 0  MEWS Pulse 0  MEWS RR 0  MEWS LOC 0  MEWS Score 0   Patient arrived from cath lab to [id number], patient placed on monitor and vital signs obtained. Patient with TR band on the right wrist. Upon assessment while cath lab staff in room an area above band elbow side, appears to be slightly swollen, cath lab staff assessed and explained to call if swelling continued, Bedside RN aware and in room. Faige Seely Jessup RN

## 2023-11-13 DIAGNOSIS — F419 Anxiety disorder, unspecified: Secondary | ICD-10-CM

## 2023-11-13 MED ORDER — TRAMADOL HCL 50 MG PO TABS: 50.0000 mg | ORAL_TABLET | Freq: Two times a day (BID) | ORAL | 0 refills | Status: DC | PRN

## 2023-11-13 NOTE — Progress Notes (Addendum)
 Discharge instructions reviewed with pt and his wife. Radial care discussed at length, pt has a hematoma to R radial site--pt has good radial and ulnar pulse, denies numbness and tingling to fingers/hand. Handout on radial care provided in d/c instructions (pt advised not to play pickle ball at this time(see note below also).  Copy of instructions given to pt. Pt informed his script was sent to his pharmacy for pick up.  Pt asking for pain medication dose of tramadol before discharging. Pt has no current order for tramadol.  Pt's nurse has already had MD/PA send a script for tramadol to his pharmacy for his shoulder pain. Pt states tylenol  does not help pain.   Pt's nurse reaching out to PA/MD to ask about one time dose of tramadol.   Pt also wanting to speak to someone about making sure his [place] insurance is being used and not his medicare and supplement. Working on reaching out to [employer] to speak with pt.   Pt will be d/c'd via wheelchair with belongings and will be escorted by staff/hospital volunteer when the above issues completed.   Madelin Barefoot, RN SWOT   At 1140---billing dept spoke with pt and his wife over the phone to address their question about billing/insurance.   PA did not want to give pt any further pain medication here before d/c, offered pt tylenol , pt declined, advised him to take his tramadol once he picks it up from his pharmacy. (Pt's pain is in his shoulders, pt states he plays pickle ball, pt advised to hold off on playing pickle ball until he follows up with his PCP and/or his orthopedic MD about his shoulder pain).    Madelin Barefoot, RN SWOT

## 2023-11-13 NOTE — Progress Notes (Signed)
 Rounding Note   Patient Name: Arthur Moore Date of Encounter: 11/13/2023  [facility] Cardiologist: Annabella Scarce, MD   Subjective R wrist hematoma after cath.  L shoulder pain persists.   Scheduled Meds:  aspirin  EC  81 mg Oral Daily   budesonide -formoterol   2 puff Inhalation BID   busPIRone   10 mg Oral BID   cholecalciferol   5,000 Units Oral QPM   heparin   5,000 Units Subcutaneous Q8H   lamoTRIgine   200 mg Oral BID   loratadine   10 mg Oral Daily   pantoprazole   40 mg Oral Daily   rosuvastatin   20 mg Oral QPM   sertraline   100 mg Oral Daily   sodium chloride  flush  3 mL Intravenous Q12H   traZODone   50 mg Oral QHS   Continuous Infusions:  sodium chloride      PRN Meds: sodium chloride , acetaminophen , albuterol , budesonide , diclofenac  Sodium, fluticasone , hydrOXYzine , nitroGLYCERIN , ondansetron  (ZOFRAN ) IV, sodium chloride  flush, sodium chloride  flush   Vital Signs  Vitals:   11/12/23 2020 11/12/23 2327 11/13/23 0403 11/13/23 0742  BP: 114/61 131/61 114/62 126/67  Pulse: 79 80 73 75  Resp: 14 18 12 16   Temp:  98.1 F (36.7 C) 98.1 F (36.7 C) 98.3 F (36.8 C)  TempSrc:  Oral Oral Oral  SpO2: 94% 94% 94% 94%  Weight:      Height:        Intake/Output Summary (Last 24 hours) at 11/13/2023 0846 Last data filed at 11/12/2023 2129 Gross per 24 hour  Intake 1117.6 ml  Output --  Net 1117.6 ml      11/10/2023    2:13 PM 10/05/2023   12:23 PM 10/01/2023    9:50 AM  Last 3 Weights  Weight (lbs) 184 lb 1.4 oz 184 lb 184 lb 9.6 oz  Weight (kg) 83.5 kg 83.462 kg 83.734 kg      Telemetry SInus rhyhtm.  No events - Personally Reviewed  ECG  N/a - Personally Reviewed  Physical Exam  VS:  BP 126/67 (BP Location: Left Arm)   Pulse 75   Temp 98.3 F (36.8 C) (Oral)   Resp 16   Ht 6' (1.829 m)   Wt 83.5 kg   SpO2 94%   BMI 24.97 kg/m  , BMI Body mass index is 24.97 kg/m. GENERAL:  Well appearing HEENT: Pupils equal round and reactive, fundi  not visualized, oral mucosa unremarkable NECK:  No jugular venous distention, waveform within normal limits, carotid upstroke brisk and symmetric, no bruits, no thyromegaly LUNGS:  Clear to auscultation bilaterally HEART:  RRR.  PMI not displaced or sustained,S1 and S2 within normal limits, no S3, no S4, no clicks, no rubs, no murmurs ABD:  Flat, positive bowel sounds normal in frequency in pitch, no bruits, no rebound, no guarding, no midline pulsatile mass, no hepatomegaly, no splenomegaly EXT:  2 plus pulses throughout, no edema, no cyanosis no clubbing SKIN:  No rashes no nodules NEURO:  Cranial nerves II through XII grossly intact, motor grossly intact throughout PSYCH:  Cognitively intact, oriented to person place and time   Labs High Sensitivity Troponin:  No results for input(s): TROPONINIHS in the last 720 hours.   Chemistry Recent Labs  Lab 11/10/23 1415 11/11/23 1755 11/12/23 0323  NA 141 139 139  K 4.3 3.7 4.4  CL 107 108 105  CO2 23 22 26   GLUCOSE 101* 139* 106*  BUN 19 15 18   CREATININE 1.22 1.27* 1.38*  CALCIUM  9.4 9.0 9.1  PROT  --  6.1*  --   ALBUMIN  --  3.8  --   AST  --  38  --   ALT  --  38  --   ALKPHOS  --  50  --   BILITOT  --  1.1  --   GFRNONAA >60 >60 54*  ANIONGAP 10 9 8     Lipids No results for input(s): CHOL, TRIG, HDL, LABVLDL, LDLCALC, CHOLHDL in the last 168 hours.  Hematology Recent Labs  Lab 11/10/23 1415 11/11/23 1755 11/12/23 0323  WBC 5.5 5.1 5.5  RBC 4.57 4.71 4.48  HGB 13.8 14.2 13.6  HCT 39.7 41.3 39.2  MCV 86.9 87.7 87.5  MCH 30.2 30.1 30.4  MCHC 34.8 34.4 34.7  RDW 13.2 13.3 13.2  PLT 105* 102* 106*   Thyroid   Recent Labs  Lab 11/11/23 1755  TSH 1.310    BNP Recent Labs  Lab 11/10/23 1450  PROBNP 103.0    DDimer  Recent Labs  Lab 11/10/23 1450  DDIMER 0.30     Radiology  CARDIAC CATHETERIZATION Result Date: 11/12/2023 Images from the original result were not included. Cardiac  Catheterization 11/12/23: Hemodynamic data: LVEDP 20 mmHg, mildly elevated.  No pressure gradient across the aortic valve. Angiographic data: LM: Large-caliber vessel, mildly calcified. LAD: Large-caliber vessel, proximal and mid LAD have periarterial calcification, ostial 20% stenosis, a moderate-sized D1 with ostial 50% stenosis unchanged from cardiac catheterization in 2020.  There is mild scattered disease of the LAD. LCx: Moderate caliber vessel.  Minimal disease is evident. RCA: Large-caliber vessel, distal RCA has a 20% stenosis, large PL and PDA branches. Impression and recommendations: No significant change in coronary artery since 2020 cardiac catheterization.  Evaluate for noncardiac causes of chest pain.    Cardiac Studies Echo 07/13/23:  1. Left ventricular ejection fraction, by estimation, is 50 to 55%. The  left ventricle has low normal function. The left ventricle has no regional  wall motion abnormalities. Left ventricular diastolic parameters are  consistent with Grade I diastolic  dysfunction (impaired relaxation). The average left ventricular global  longitudinal strain is 14.7 %.   2. Right ventricular systolic function is normal. The right ventricular  size is normal. Tricuspid regurgitation signal is inadequate for assessing  PA pressure.   3. The mitral valve is normal in structure. No evidence of mitral valve  regurgitation.   4. Eccentric jet towards the anterior mitral valve leaflet. The aortic  valve is normal in structure. Aortic valve regurgitation is moderate.   5. The inferior vena cava is normal in size with greater than 50%  respiratory variability, suggesting right atrial pressure of 3 mmHg.   Cardiac CT-A 06/2022:   IMPRESSION: 1. Moderate mixed CAD, CADRADS = 3. CT FFR will be performed and reported separately.   2. Coronary calcium  score of 1027. This was 84th percentile for age and sex matched control.   3. Normal coronary origin with right  dominance.   4. Aortic atherosclerosis  FFR 1. Left Main:  No significant stenosis. FFR = 1.00   2. LAD: No significant stenosis. Proximal FFR = 0.96, Mid FFR = 0.85, Distal FFR = 0.80 3. LCX: No significant stenosis. Proximal FFR = 0.98, Distal FFR = 0.96 4. RCA: No significant stenosis. Proximal FFR = 1.00, Mid FFR = 0.96, Distal FFR = 0.92   IMPRESSION: 1.  CT FFR analysis did not show any significant stenosis (<0.75).  Cardiac Catheterization 11/12/23: Hemodynamic data: LVEDP 20 mmHg, mildly  elevated.  No pressure gradient across the aortic valve.   Angiographic data: LM: Large-caliber vessel, mildly calcified. LAD: Large-caliber vessel, proximal and mid LAD have periarterial calcification, ostial 20% stenosis, a moderate-sized D1 with ostial 50% stenosis unchanged from cardiac catheterization in 2020.  There is mild scattered disease of the LAD. LCx: Moderate caliber vessel.  Minimal disease is evident. RCA: Large-caliber vessel, distal RCA has a 20% stenosis, large PL and PDA branches.      Impression and recommendations: No significant change in coronary artery since 2020 cardiac catheterization.  Evaluate for noncardiac causes of chest pain.    Patient Profile   Arthur Moore is a 41M with non-obstructive CAD, mild-moderate aortic insufficiency, hypertension, hyperlipidemia, asthma, TIA, OSA not on CPAP, liver fibrosis, anxiety, and depression here with nausea and L shoulder discomfort.  Assessment & Plan     # L shoulder discomfort:  # CAD:  Symptoms are ongoing and nonexertional.  Cardiac enzymes are negative.  EKG unremarkable. Cath reviewed mild non-obstructive disease.  Continue medical management.  This is no ischemia.  Recommended that he see orthopedics.  He did have a R wrist hematoma after cath that resolved with compression.  Continue aspirin , metoprolol , and rosuvastatin .   # Aortic regurgitation:  Minimal ankle edema has now improved.  Continue  outpatient f/u.      For questions or updates, please contact [facility] Please consult [email or web address] for contact info under     Signed, Annabella Scarce, MD  11/13/2023, 8:46 AM

## 2023-11-13 NOTE — Plan of Care (Signed)

## 2023-11-23 ENCOUNTER — Ambulatory Visit (INDEPENDENT_AMBULATORY_CARE_PROVIDER_SITE_OTHER)

## 2023-11-23 DIAGNOSIS — J309 Allergic rhinitis, unspecified: Secondary | ICD-10-CM | POA: Diagnosis not present

## 2023-11-23 DIAGNOSIS — I700 Atherosclerosis of aorta: Secondary | ICD-10-CM | POA: Diagnosis not present

## 2023-11-23 DIAGNOSIS — I25118 Atherosclerotic heart disease of native coronary artery with other forms of angina pectoris: Secondary | ICD-10-CM | POA: Diagnosis not present

## 2023-11-23 DIAGNOSIS — I10 Essential (primary) hypertension: Secondary | ICD-10-CM | POA: Diagnosis not present

## 2023-11-24 ENCOUNTER — Other Ambulatory Visit: Payer: Self-pay | Admitting: Nurse Practitioner

## 2023-11-24 DIAGNOSIS — K766 Portal hypertension: Secondary | ICD-10-CM | POA: Diagnosis not present

## 2023-11-24 DIAGNOSIS — K7469 Other cirrhosis of liver: Secondary | ICD-10-CM | POA: Diagnosis not present

## 2023-11-24 DIAGNOSIS — K7581 Nonalcoholic steatohepatitis (NASH): Secondary | ICD-10-CM

## 2023-11-25 ENCOUNTER — Telehealth: Payer: Self-pay

## 2023-11-25 NOTE — Telephone Encounter (Signed)
 Preop APP has updated the requesting office the pt has appt 11/27/23 with Gabe Finder, NP. Appt notes noted need preop clearance.

## 2023-11-25 NOTE — Telephone Encounter (Signed)
   Name: Arthur Moore  DOB: [birth year]  MRN: [id number]  Primary Cardiologist: Annabella Scarce, MD  Chart reviewed as part of pre-operative protocol coverage. The patient has an upcoming visit scheduled with  Reche Finder, PA on 11/27/2023 at which time clearance can be addressed in case there are any issues that would impact surgical recommendations.  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 I added preop FYI to appointment note so that provider is aware to address at time of outpatient visit.  Per office protocol the cardiology provider should forward their finalized clearance decision and recommendations regarding antiplatelet therapy to the requesting party below.     I will route this message as FYI to requesting party and remove this message from the preop box as separate preop APP input not needed at this time.   Please call with any questions.  Lamarr Satterfield, NP  11/25/2023, 9:40 AM

## 2023-11-25 NOTE — Telephone Encounter (Signed)
   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 Phone number:  [phone] Fax number:  [phone]   Type of Clearance Requested:   - Medical  - Pharmacy:  Hold Aspirin      Type of Anesthesia:  Not Indicated   Additional requests/questions:    Signed, Lucie DELENA Ku   11/25/2023, 8:00 AM

## 2023-11-26 DIAGNOSIS — I10 Essential (primary) hypertension: Secondary | ICD-10-CM | POA: Diagnosis not present

## 2023-11-26 DIAGNOSIS — D225 Melanocytic nevi of trunk: Secondary | ICD-10-CM | POA: Diagnosis not present

## 2023-11-26 DIAGNOSIS — E7800 Pure hypercholesterolemia, unspecified: Secondary | ICD-10-CM | POA: Diagnosis not present

## 2023-11-26 DIAGNOSIS — F411 Generalized anxiety disorder: Secondary | ICD-10-CM | POA: Diagnosis not present

## 2023-11-26 DIAGNOSIS — I25118 Atherosclerotic heart disease of native coronary artery with other forms of angina pectoris: Secondary | ICD-10-CM | POA: Diagnosis not present

## 2023-11-26 DIAGNOSIS — L578 Other skin changes due to chronic exposure to nonionizing radiation: Secondary | ICD-10-CM | POA: Diagnosis not present

## 2023-11-26 DIAGNOSIS — L821 Other seborrheic keratosis: Secondary | ICD-10-CM | POA: Diagnosis not present

## 2023-11-26 DIAGNOSIS — L218 Other seborrheic dermatitis: Secondary | ICD-10-CM | POA: Diagnosis not present

## 2023-11-26 DIAGNOSIS — L814 Other melanin hyperpigmentation: Secondary | ICD-10-CM | POA: Diagnosis not present

## 2023-11-26 DIAGNOSIS — Z85828 Personal history of other malignant neoplasm of skin: Secondary | ICD-10-CM | POA: Diagnosis not present

## 2023-11-26 DIAGNOSIS — I700 Atherosclerosis of aorta: Secondary | ICD-10-CM | POA: Diagnosis not present

## 2023-11-26 DIAGNOSIS — L905 Scar conditions and fibrosis of skin: Secondary | ICD-10-CM | POA: Diagnosis not present

## 2023-11-26 DIAGNOSIS — D1801 Hemangioma of skin and subcutaneous tissue: Secondary | ICD-10-CM | POA: Diagnosis not present

## 2023-11-26 DIAGNOSIS — L570 Actinic keratosis: Secondary | ICD-10-CM | POA: Diagnosis not present

## 2023-11-26 DIAGNOSIS — L813 Cafe au lait spots: Secondary | ICD-10-CM | POA: Diagnosis not present

## 2023-11-26 DIAGNOSIS — Z08 Encounter for follow-up examination after completed treatment for malignant neoplasm: Secondary | ICD-10-CM | POA: Diagnosis not present

## 2023-11-27 ENCOUNTER — Encounter (HOSPITAL_BASED_OUTPATIENT_CLINIC_OR_DEPARTMENT_OTHER): Payer: Self-pay | Admitting: Family

## 2023-11-27 ENCOUNTER — Ambulatory Visit (HOSPITAL_BASED_OUTPATIENT_CLINIC_OR_DEPARTMENT_OTHER): Admitting: Family

## 2023-11-27 VITALS — BP 128/60 | HR 67 | Ht 71.5 in | Wt 190.0 lb

## 2023-11-27 DIAGNOSIS — I25118 Atherosclerotic heart disease of native coronary artery with other forms of angina pectoris: Secondary | ICD-10-CM | POA: Diagnosis not present

## 2023-11-27 DIAGNOSIS — E785 Hyperlipidemia, unspecified: Secondary | ICD-10-CM | POA: Diagnosis not present

## 2023-11-27 DIAGNOSIS — I351 Nonrheumatic aortic (valve) insufficiency: Secondary | ICD-10-CM | POA: Diagnosis not present

## 2023-11-27 NOTE — Patient Instructions (Addendum)
 Medication Instructions:  Continue your current message  Okay to hold Aspirin  a week prior to procedures  *If you need a refill on your cardiac medications before your next appointment, please call your pharmacy*  Follow-Up: At [facility], you and your health needs are our priority.  As part of our continuing mission to provide you with exceptional heart care, our providers are all part of one team.  This team includes your primary Cardiologist (physician) and Advanced Practice Providers or APPs (Physician Assistants and Nurse Practitioners) who all work together to provide you with the care you need, when you need it.  Your next appointment:   December 2025 with Dr. Raford  We recommend signing up for the patient portal called MyChart.  Sign up information is provided on this After Visit Summary.  MyChart is used to connect with patients for Virtual Visits (Telemedicine).  Patients are able to view lab/test results, encounter notes, upcoming appointments, etc.  Non-urgent messages can be sent to your provider as well.   To learn more about what you can do with MyChart, go to [email or web address].   Other Instructions  For coronary artery disease often called heart disease we aim for optimal guideline directed medical therapy. We use the A, B, Cs to help keep us  on track!  A = Aspirin  81mg  daily B = Beta blocker which helps to relax the heart and prevent heat attack. This is your Metoprolol . C = Cholesterol control. You take Rosuvastatin  to help control your cholesterol.  D = Diet - aiming for a low sodium heart healthy diet E = Exercise - aiming for 150 minutes of moderate intensity exercise per week

## 2023-11-27 NOTE — Progress Notes (Signed)
 Cardiology Office Note   Date:  11/27/2023  ID:  Arthur Moore, DOB May 31, 1950, MRN [id number] PCP: Arthur Motto, DO  Arthur Moore Cardiologist:  Arthur Scarce, MD     History of Present Illness Arthur Moore is a 73 y.o. male with history of nonobstructive CAD, moderate AI, TIA, liver fibrosis, anxiety, depression, HTN, HLD, arthritis, cervical/lumbar disc disease, asthma, aortic atherosclerosis, chronic appearing thrombocytopenia and CKD stage 2-3a.   Prior Holter monitor 2015 with rare PAC/PVC.  LHC 06/2018 mild nonobstructive CAD.  Coronary CTA 06/2022 moderate mixed 50 to 69% stenosis in proximal LAD, 1-24% D1, 1-24% AV groove circumflex, 1-24% RCA, FFR negative.  Echo 06/2023 LVEF 50 to 55%, no RWMA, grade 1 diastolic dysfunction, moderate AI.  Admitted 7/15 - 11/13/2023 with chest pain.  LHC 11/12/2023 with mild nonobstructive disease with no significant change from prior [facility] 2020.   Presents today for preop for vitrectomy.  Feeling overall well since LHC.  LHC reviewed in detail, reassurance provided.  Medical management of CAD discussed.  He does have 4x2 bruis proximal to R radial cath site which is not painful. Reports no shortness of breath nor dyspnea on exertion. Reports no chest pain, pressure, or tightness. No edema, orthopnea, PND. Reports no palpitations.  Motivated to return to exercise though pending evaluation of L shoulder and R arm by orthopedics prior. Anticipates needing surgery.   ROS: Please see the history of present illness.    All other systems reviewed and are negative.   Studies Reviewed      Cardiac Studies & Procedures   ______________________________________________________________________________________________ CARDIAC CATHETERIZATION  CARDIAC CATHETERIZATION 11/12/2023  Conclusion Images from the original result were not included. Cardiac Catheterization 11/12/23: Hemodynamic data: LVEDP 20 mmHg, mildly elevated.  No  pressure gradient across the aortic valve.  Angiographic data: LM: Large-caliber vessel, mildly calcified. LAD: Large-caliber vessel, proximal and mid LAD have periarterial calcification, ostial 20% stenosis, a moderate-sized D1 with ostial 50% stenosis unchanged from cardiac catheterization in 2020.  There is mild scattered disease of the LAD. LCx: Moderate caliber vessel.  Minimal disease is evident. RCA: Large-caliber vessel, distal RCA has a 20% stenosis, large PL and PDA branches.    Impression and recommendations: No significant change in coronary artery since 2020 cardiac catheterization.  Evaluate for noncardiac causes of chest pain.  Findings Coronary Findings Diagnostic  Dominance: Right  Left Main Vessel is small. Dist LM to Ost LAD lesion is 20% stenosed.  Left Anterior Descending Vessel is large. Prox LAD lesion is 20% stenosed. Dist LAD lesion is 20% stenosed.  First Diagonal Branch Ost 1st Diag lesion is 50% stenosed.  Left Circumflex Prox Cx lesion is 10% stenosed.  Right Coronary Artery Vessel is large. Dist RCA lesion is 20% stenosed.  Intervention  No interventions have been documented.   CARDIAC CATHETERIZATION  CARDIAC CATHETERIZATION 07/08/2018  Conclusion  Dist RCA lesion is 20% stenosed.  Prox Cx lesion is 10% stenosed.  Dist LM to Ost LAD lesion is 20% stenosed.  Ost 1st Diag lesion is 50% stenosed.  The left ventricular systolic function is normal.  LV end diastolic pressure is normal.  The left ventricular ejection fraction is 50-55% by visual estimate.  There is no mitral valve regurgitation.  Prox LAD lesion is 20% stenosed.  1. Mild non-obstructive CAD 2. Low normal LV systolic function  Recommendations: Medical management of CAD  Findings Coronary Findings Diagnostic  Dominance: Right  Left Main Dist LM to Ost LAD lesion is 20% stenosed.  Left Anterior Descending Vessel is large. Prox LAD lesion is 20%  stenosed.  First Diagonal Branch Vessel is moderate in size. Ost 1st Diag lesion is 50% stenosed.  Left Circumflex Prox Cx lesion is 10% stenosed.  Right Coronary Artery Vessel is large. Dist RCA lesion is 20% stenosed.  Intervention  No interventions have been documented.   STRESS TESTS  MYOCARDIAL PERFUSION IMAGING 06/26/2016  Interpretation Summary  Nuclear stress EF: 49%. Asynchronous contraction.  The left ventricular ejection fraction is mildly decreased (45-54%).  There was no ST segment deviation noted during stress.  Defect 1: There is a small defect of mild severity present in the basal inferior location. Likely diaphragmatic attenuation artifact.  This is a low risk study. No ischemia identified.  Arthur Parchment, MD   ECHOCARDIOGRAM  ECHOCARDIOGRAM COMPLETE 07/13/2023  Narrative ECHOCARDIOGRAM REPORT    Patient Name:   Arthur Moore Date of Exam: 07/13/2023 Medical Rec #:  [id number]         Height:       70.5 in Accession #:    [id number]        Weight:       181.8 lb Date of Birth:  Jan 03, 1951         BSA:          2.014 m Patient Age:    73 years          BP:           142/76 mmHg Patient Gender: M                 HR:           64 bpm. Exam Location:  Outpatient  Procedure: 2D Echo, 3D Echo, Color Doppler, Cardiac Doppler and Strain Analysis (Both Spectral and Color Flow Doppler were utilized during procedure).  Indications:    Aortic insufficiency  History:        Patient has prior history of Echocardiogram examinations, most recent 07/09/2022. CAD, Arrythmias:PVC and PAC; Risk Factors:Non-Smoker and Hypertension. Mild-moderate aortic insufficiency.  Sonographer:    Arthur Moore RDCS Referring Phys: [id number] Arthur Moore  IMPRESSIONS   1. Left ventricular ejection fraction, by estimation, is 50 to 55%. The left ventricle has low normal function. The left ventricle has no regional wall motion abnormalities. Left ventricular  diastolic parameters are consistent with Grade I diastolic dysfunction (impaired relaxation). The average left ventricular global longitudinal strain is 14.7 %. 2. Right ventricular systolic function is normal. The right ventricular size is normal. Tricuspid regurgitation signal is inadequate for assessing PA pressure. 3. The mitral valve is normal in structure. No evidence of mitral valve regurgitation. 4. Eccentric jet towards the anterior mitral valve leaflet. The aortic valve is normal in structure. Aortic valve regurgitation is moderate. 5. The inferior vena cava is normal in size with greater than 50% respiratory variability, suggesting right atrial pressure of 3 mmHg.  Conclusion(s)/Recommendation(s): AI appears mildly worse compared to prior study 07/09/2022.  FINDINGS Left Ventricle: Left ventricular ejection fraction, by estimation, is 50 to 55%. The left ventricle has low normal function. The left ventricle has no regional wall motion abnormalities. The average left ventricular global longitudinal strain is 14.7 %. Strain was performed and the global longitudinal strain is indeterminate. The left ventricular internal cavity size was normal in size. There is no left ventricular hypertrophy. Left ventricular diastolic parameters are consistent with Grade I diastolic dysfunction (impaired relaxation).  Right Ventricle: The right ventricular size is normal. Right ventricular systolic  function is normal. Tricuspid regurgitation signal is inadequate for assessing PA pressure.  Left Atrium: Left atrial size was normal in size.  Right Atrium: Right atrial size was normal in size.  Pericardium: There is no evidence of pericardial effusion.  Mitral Valve: The mitral valve is normal in structure. No evidence of mitral valve regurgitation.  Tricuspid Valve: Tricuspid valve regurgitation is mild.  Aortic Valve: Eccentric jet towards the anterior mitral valve leaflet. The aortic valve is normal  in structure. Aortic valve regurgitation is moderate. Aortic regurgitation PHT measures 697 msec.  Pulmonic Valve: Pulmonic valve regurgitation is mild.  Aorta: The aortic root and ascending aorta are structurally normal, with no evidence of dilitation.  Venous: The inferior vena cava is normal in size with greater than 50% respiratory variability, suggesting right atrial pressure of 3 mmHg.  IAS/Shunts: The interatrial septum was not well visualized.   LEFT VENTRICLE PLAX 2D LVIDd:         5.28 cm   Diastology LVIDs:         3.39 cm   LV e' medial:    5.66 cm/s LV PW:         0.95 cm   LV E/e' medial:  11.1 LV IVS:        0.92 cm   LV e' lateral:   10.30 cm/s LVOT diam:     2.00 cm   LV E/e' lateral: 6.1 LV SV:         65 LV SV Index:   32        2D Longitudinal Strain LVOT Area:     3.14 cm  2D Strain GLS (A4C):   13.3 % 2D Strain GLS (A3C):   13.8 % 2D Strain GLS (A2C):   17.0 % 2D Strain GLS Avg:     14.7 %  3D Volume EF: 3D EF:        51 % LV EDV:       146 ml LV ESV:       71 ml LV SV:        75 ml  RIGHT VENTRICLE RV Basal diam:  4.06 cm RV Mid diam:    3.39 cm RV S prime:     9.25 cm/s TAPSE (M-mode): 2.6 cm  LEFT ATRIUM             Index        RIGHT ATRIUM           Index LA diam:        3.70 cm 1.84 cm/m   RA Area:     17.50 cm LA Vol (A2C):   47.2 ml 23.43 ml/m  RA Volume:   48.70 ml  24.18 ml/m LA Vol (A4C):   23.7 ml 11.77 ml/m LA Biplane Vol: 35.6 ml 17.67 ml/m AORTIC VALVE LVOT Vmax:         93.90 cm/s LVOT Vmean:        61.900 cm/s LVOT VTI:          0.206 m AI PHT:            697 msec AR Vena Contracta: 0.37 cm  AORTA Ao Root diam: 3.50 cm Ao Asc diam:  3.50 cm  MITRAL VALVE MV Area (PHT): 3.03 cm    SHUNTS MV Decel Time: 250 msec    Systemic VTI:  0.21 m MV E velocity: 63.10 cm/s  Systemic Diam: 2.00 cm MV A velocity: 98.50 cm/s MV E/A ratio:  0.64  Ronal Ross Electronically signed by Ronal Ross Signature Date/Time:  07/13/2023/2:09:21 PM    Final      CT SCANS  CT CORONARY MORPH W/CTA COR W/SCORE 06/30/2022  Addendum 07/01/2022  8:25 PM ADDENDUM REPORT: 07/01/2022 20:23  EXAM: OVER-READ INTERPRETATION  CT CHEST  The following report is an over-read performed by radiologist Dr. Franky Leff [facility] on 07/01/2022. This over-read does not include interpretation of cardiac or coronary anatomy or pathology. The coronary CTA interpretation by the cardiologist is attached.  COMPARISON:  None.  FINDINGS: Heart is normal size. Aorta normal caliber. No adenopathy. No confluent airspace opacities or effusions. No acute findings in the upper abdomen. Chest wall soft tissues are unremarkable. No acute bony abnormality.  IMPRESSION: No acute extra cardiac abnormality.  Scattered descending aortic atherosclerosis.   Electronically Signed By: Franky Crease M.D. On: 07/01/2022 20:23  Narrative HISTORY: 73 yo male with dyspnea on exertion (DOE)  EXAM: Cardiac/Coronary CTA  TECHNIQUE: The patient was scanned on a Office manager.  PROTOCOL: A 120 kV prospective scan was triggered in the descending thoracic aorta at 111 HU's. Axial non-contrast 3 mm slices were carried out through the heart. The data set was analyzed on a dedicated work station and scored using the Agatson method. Gantry rotation speed was 250 msecs and collimation was .6 mm. Beta blockade and 0.8 mg of sl NTG was given. The 3D data set was reconstructed in 5% intervals of the 35-75 % of the R-R cycle. Diastolic phases were analyzed on a dedicated work station using MPR, MIP and VRT modes. The patient received 95mL OMNIPAQUE  IOHEXOL  350 MG/ML SOLN of contrast.  FINDINGS: Quality: Good, HR 74  Coronary calcium  score: The patient's coronary artery calcium  score is 1027, which places the patient in the 84th percentile.  Coronary arteries: Normal coronary origins.  Right dominance.  Right Coronary  Artery: Dominant. Minimal mixed distal 1-24% stenosis.  Left Main Coronary Artery: Normal. Bifurcates into the LAD and LCx arteries.  Left Anterior Descending Coronary Artery: Anterior artery that extends around the apex. The proximal vessel is heavily calcified with probably mild to moderate stenosis (50-69%). High D1 branch which bifurcates and has minimal 1-24% proximal stenosis.  Left Circumflex Artery: AV groove vessel with minimal mixed 1-24% proximal stenosis. There is a large OM branch which bifurcates and is without disease.  Aorta: Normal size, 32 mm at the mid ascending aorta (level of the PA bifurcation) measured double oblique. Aortic atherosclerosis. No dissection.  Aortic Valve: Trileaflet. No calcifications.  Other findings:  Normal pulmonary vein drainage into the left atrium.  Normal left atrial appendage without a thrombus.  Normal size of the pulmonary artery.  IMPRESSION: 1. Moderate mixed CAD, CADRADS = 3. CT FFR will be performed and reported separately.  2. Coronary calcium  score of 1027. This was 84th percentile for age and sex matched control.  3. Normal coronary origin with right dominance.  4. Aortic atherosclerosis  Electronically Signed: By: Vinie JAYSON Maxcy M.D. On: 06/30/2022 16:58   CT SCANS  CT CORONARY FRACTIONAL FLOW RESERVE DATA PREP 05/21/2017   CT SCANS  CT CORONARY MORPH W/CTA COR W/SCORE 05/15/2017  Addendum 05/21/2017 12:52 PM ADDENDUM REPORT: 05/21/2017 12:49  ADDENDUM: CT sent for FFR.  FFR 0.83 in mid LAD. I do no think there is hemodynamically significant disease in the LAD. The LCx and RCA have no hemodynamically significant disease.   Electronically Signed By: Ezra Shuck M.D. On: 05/21/2017 12:49  Addendum 05/15/2017 11:10 PM ADDENDUM REPORT: 05/15/2017 23:07  CLINICAL DATA:  Chest pain  EXAM: Cardiac CTA  MEDICATIONS: Sub lingual nitro. 4mg  and lopressor  mg  TECHNIQUE: The patient was  scanned on a Siemens 192 slice scanner. Gantry rotation speed was 240 msecs. Collimation was 0.6 mm. A 100 kV prospective scan was triggered in the acending thoracic aorta at 35-75% of the R-R interval. Average HR during the scan was 65 bpm. The 3D data set was interpreted on a dedicated work station using MPR, MIP and VRT modes. A total of 80cc of contrast was used.  FINDINGS: Non-cardiac: See separate report from [facility].  Calcium  Score: 361 Agatston units  Coronary Arteries: Right dominant with no anomalies  LM: No plaque or stenosis.  LAD system: Mixed plaque ostium D1 with mild stenosis. Extensive mixed plaque in the proximal LAD after D1. Cannot rule out moderate stenosis though blooming artifact makes this difficult to interpret.  Circumflex system: Mixed plaque proximal LCx without significant stenosis.  RCA system: Calcified plaque in the proximal RCA without significant stenosis.  IMPRESSION: 1. Coronary artery calcium  score 361 Agatston units, placing the patient in the 78th percentile for age and gender, suggesting intermediate to high risk for future cardiac events.  2. Extensive calcified plaque in the proximal LAD, possible moderate stenosis though blooming artifact may make the disease look worse than it actually is. I will send for FFR.  Dalton Mclean   Electronically Signed By: Ezra Shuck M.D. On: 05/15/2017 23:07  Narrative EXAM: OVER-READ INTERPRETATION  CT CHEST  The following report is an over-read performed by radiologist Dr. Marcey Moan of [facility] on 05/15/2017. This over-read does not include interpretation of cardiac or coronary anatomy or pathology. The coronary calcium  score/coronary CTA interpretation by the cardiologist is attached.  COMPARISON:  CTA of the chest on 05/09/2017  FINDINGS: Vascular: Normal caliber central pulmonary arteries.  Mediastinum/Nodes: No lymphadenopathy  identified.  Lungs/Pleura: Mild pulmonary scarring. No nodules or infiltrates. No edema or pleural fluid.  Upper Abdomen: No acute abnormality.  Musculoskeletal: No chest wall mass or suspicious bone lesions identified.  IMPRESSION: No significant nonvascular findings.  Electronically Signed: By: Marcey Moan M.D. On: 05/15/2017 16:37     ______________________________________________________________________________________________      Risk Assessment/Calculations           Physical Exam VS:  BP 128/60   Pulse 67   Ht 5' 11.5 (1.816 m)   Wt 190 lb (86.2 kg)   SpO2 94%   BMI 26.13 kg/m        Wt Readings from Last 3 Encounters:  11/27/23 190 lb (86.2 kg)  11/10/23 184 lb 1.4 oz (83.5 kg)  10/05/23 184 lb (83.5 kg)    GEN: Well nourished, well developed in no acute distress NECK: No JVD; No carotid bruits CARDIAC: RRR, no murmurs, rubs, gallops RESPIRATORY:  Clear to auscultation without rales, wheezing or rhonchi  ABDOMEN: Soft, non-tender, non-distended EXTREMITIES:  No edema; No deformity. . R radial cath site with 4x2 purple ecchymosis which is firm to touch but nontender and no hematoma.  ASSESSMENT AND PLAN  Assessment & Plan Coronary artery disease with stable non-obstructive plaque / HLD, LDL goal <70 LH 10/2023 stable from 2020. R radial cath site with 4x2 purple ecchymosis which is firm to touch but nontender and no hematoma. Medical management  with GDMT aspirin  81 mg daily, Lopressor  12.5 mg twice daily, rosuvastatin  40 mg daily, as needed nitroglycerin .   Recommend  aiming for 150 minutes of moderate intensity activity per week and following a heart healthy diet.    Right arm and L shoulder injury, pending possible surgical intervention Right arm and shoulder injury with increasing pain, considering surgical intervention. Previous injections provided temporary relief. No formal clearance yet received. Per AHA/ACC guidelines, he is deemed  acceptable risk for the planned procedure without additional cardiovascular testing. Will route to surgical team so they are aware.  May hold Aspirin  7 days prior to surgery per office protocols.   Preop Pending vitrectomy. Exercise tolerance >4 METS. Per AHA/ACC guidelines, he is deemed acceptable risk for the planned procedure without additional cardiovascular testing. Will route to surgical team so they are aware.  May hold Aspirin  7 days prior to planned procedure, has already begun holding aspirin  prior to 12/03/23 procedure.  Moderate Aortic regurgitation No heart failure symptoms. Repeat echo 12/31/23 as scheduled. Does not need completed prior to surgery.          Dispo: follow up 03/2024 with Dr. Raford   Signed, Reche GORMAN Finder, NP

## 2023-11-30 ENCOUNTER — Telehealth: Payer: Self-pay

## 2023-11-30 NOTE — Telephone Encounter (Signed)
 He returned my call, explained PREP, he would like to attend, but has eye surgeries scheduled and a possible biceps tendon tear that needs to be evaluated; he would like a call back in Sept to discuss October class schedule.

## 2023-11-30 NOTE — Telephone Encounter (Signed)
Called to discuss PREP program referral, left voicemail requesting return call.

## 2023-12-01 DIAGNOSIS — M25521 Pain in right elbow: Secondary | ICD-10-CM | POA: Diagnosis not present

## 2023-12-01 DIAGNOSIS — M7711 Lateral epicondylitis, right elbow: Secondary | ICD-10-CM | POA: Diagnosis not present

## 2023-12-02 DIAGNOSIS — R251 Tremor, unspecified: Secondary | ICD-10-CM | POA: Diagnosis not present

## 2023-12-03 DIAGNOSIS — H43392 Other vitreous opacities, left eye: Secondary | ICD-10-CM | POA: Diagnosis not present

## 2023-12-10 DIAGNOSIS — L570 Actinic keratosis: Secondary | ICD-10-CM | POA: Diagnosis not present

## 2023-12-15 DIAGNOSIS — Z01818 Encounter for other preprocedural examination: Secondary | ICD-10-CM | POA: Diagnosis not present

## 2023-12-15 DIAGNOSIS — H02413 Mechanical ptosis of bilateral eyelids: Secondary | ICD-10-CM | POA: Diagnosis not present

## 2023-12-15 DIAGNOSIS — H02411 Mechanical ptosis of right eyelid: Secondary | ICD-10-CM | POA: Diagnosis not present

## 2023-12-15 DIAGNOSIS — H02834 Dermatochalasis of left upper eyelid: Secondary | ICD-10-CM | POA: Diagnosis not present

## 2023-12-15 DIAGNOSIS — H57813 Brow ptosis, bilateral: Secondary | ICD-10-CM | POA: Diagnosis not present

## 2023-12-15 DIAGNOSIS — H02412 Mechanical ptosis of left eyelid: Secondary | ICD-10-CM | POA: Diagnosis not present

## 2023-12-15 DIAGNOSIS — H02831 Dermatochalasis of right upper eyelid: Secondary | ICD-10-CM | POA: Diagnosis not present

## 2023-12-15 DIAGNOSIS — H53483 Generalized contraction of visual field, bilateral: Secondary | ICD-10-CM | POA: Diagnosis not present

## 2023-12-15 DIAGNOSIS — H0279 Other degenerative disorders of eyelid and periocular area: Secondary | ICD-10-CM | POA: Diagnosis not present

## 2023-12-18 DIAGNOSIS — M25512 Pain in left shoulder: Secondary | ICD-10-CM | POA: Diagnosis not present

## 2023-12-22 ENCOUNTER — Ambulatory Visit (INDEPENDENT_AMBULATORY_CARE_PROVIDER_SITE_OTHER)

## 2023-12-22 DIAGNOSIS — J309 Allergic rhinitis, unspecified: Secondary | ICD-10-CM

## 2023-12-23 DIAGNOSIS — I10 Essential (primary) hypertension: Secondary | ICD-10-CM | POA: Diagnosis not present

## 2023-12-23 DIAGNOSIS — I700 Atherosclerosis of aorta: Secondary | ICD-10-CM | POA: Diagnosis not present

## 2023-12-23 DIAGNOSIS — I25118 Atherosclerotic heart disease of native coronary artery with other forms of angina pectoris: Secondary | ICD-10-CM | POA: Diagnosis not present

## 2023-12-24 ENCOUNTER — Ambulatory Visit
Admission: RE | Admit: 2023-12-24 | Discharge: 2023-12-24 | Disposition: A | Discharge status: Home / Self Care | Source: Ambulatory Visit | Attending: Nurse Practitioner

## 2023-12-24 DIAGNOSIS — K766 Portal hypertension: Secondary | ICD-10-CM

## 2023-12-24 DIAGNOSIS — K7469 Other cirrhosis of liver: Secondary | ICD-10-CM | POA: Diagnosis not present

## 2023-12-24 DIAGNOSIS — K7581 Nonalcoholic steatohepatitis (NASH): Secondary | ICD-10-CM

## 2023-12-25 DIAGNOSIS — M25512 Pain in left shoulder: Secondary | ICD-10-CM | POA: Diagnosis not present

## 2023-12-27 ENCOUNTER — Encounter (HOSPITAL_BASED_OUTPATIENT_CLINIC_OR_DEPARTMENT_OTHER): Payer: Self-pay | Admitting: Cardiovascular Disease

## 2023-12-27 DIAGNOSIS — I10 Essential (primary) hypertension: Secondary | ICD-10-CM | POA: Diagnosis not present

## 2023-12-27 DIAGNOSIS — E7800 Pure hypercholesterolemia, unspecified: Secondary | ICD-10-CM | POA: Diagnosis not present

## 2023-12-27 DIAGNOSIS — F411 Generalized anxiety disorder: Secondary | ICD-10-CM | POA: Diagnosis not present

## 2023-12-27 DIAGNOSIS — I25118 Atherosclerotic heart disease of native coronary artery with other forms of angina pectoris: Secondary | ICD-10-CM | POA: Diagnosis not present

## 2023-12-27 DIAGNOSIS — I700 Atherosclerosis of aorta: Secondary | ICD-10-CM | POA: Diagnosis not present

## 2023-12-31 ENCOUNTER — Ambulatory Visit (HOSPITAL_BASED_OUTPATIENT_CLINIC_OR_DEPARTMENT_OTHER)

## 2023-12-31 DIAGNOSIS — I10 Essential (primary) hypertension: Secondary | ICD-10-CM

## 2023-12-31 DIAGNOSIS — I351 Nonrheumatic aortic (valve) insufficiency: Secondary | ICD-10-CM

## 2024-01-03 LAB — ECHOCARDIOGRAM COMPLETE
AV Vena cont: 0.37 cm
Area-P 1/2: 2.91 cm2
P 1/2 time: 681 ms
S' Lateral: 2.62 cm

## 2024-01-04 DIAGNOSIS — H43391 Other vitreous opacities, right eye: Secondary | ICD-10-CM | POA: Diagnosis not present

## 2024-01-05 ENCOUNTER — Ambulatory Visit (INDEPENDENT_AMBULATORY_CARE_PROVIDER_SITE_OTHER): Admitting: Allergy and Immunology

## 2024-01-05 VITALS — BP 134/78 | HR 62 | Temp 98.5°F | Resp 16 | Ht 71.0 in | Wt 193.4 lb

## 2024-01-05 DIAGNOSIS — J309 Allergic rhinitis, unspecified: Secondary | ICD-10-CM | POA: Diagnosis not present

## 2024-01-05 DIAGNOSIS — K219 Gastro-esophageal reflux disease without esophagitis: Secondary | ICD-10-CM | POA: Diagnosis not present

## 2024-01-05 DIAGNOSIS — J4541 Moderate persistent asthma with (acute) exacerbation: Secondary | ICD-10-CM | POA: Diagnosis not present

## 2024-01-05 DIAGNOSIS — J3089 Other allergic rhinitis: Secondary | ICD-10-CM

## 2024-01-05 DIAGNOSIS — J4540 Moderate persistent asthma, uncomplicated: Secondary | ICD-10-CM | POA: Diagnosis not present

## 2024-01-05 MED ORDER — CETIRIZINE HCL 10 MG PO TABS: 10.0000 mg | ORAL_TABLET | Freq: Every day | ORAL | 1 refills | Status: AC | PRN

## 2024-01-05 MED ORDER — ALBUTEROL SULFATE HFA 108 (90 BASE) MCG/ACT IN AERS: 2.0000 | INHALATION_SPRAY | RESPIRATORY_TRACT | 1 refills | Status: AC | PRN

## 2024-01-05 MED ORDER — BUDESONIDE-FORMOTEROL FUMARATE 160-4.5 MCG/ACT IN AERO: 2.0000 | INHALATION_SPRAY | Freq: Two times a day (BID) | RESPIRATORY_TRACT | 1 refills | Status: AC

## 2024-01-05 MED ORDER — PANTOPRAZOLE SODIUM 40 MG PO TBEC: 40.0000 mg | DELAYED_RELEASE_TABLET | Freq: Two times a day (BID) | ORAL | 1 refills | Status: AC

## 2024-01-05 MED ORDER — FLUTICASONE PROPIONATE 50 MCG/ACT NA SUSP: 2.0000 | Freq: Every day | NASAL | 1 refills | Status: AC

## 2024-01-05 MED ORDER — ALBUTEROL SULFATE (2.5 MG/3ML) 0.083% IN NEBU: 2.5000 mg | INHALATION_SOLUTION | RESPIRATORY_TRACT | 2 refills | Status: AC | PRN

## 2024-01-05 NOTE — Patient Instructions (Addendum)
  1. Continue Symbicort  160 - 2 puffs twice a day with spacer (empty lungs)  2. Continue Flonase  1-2 sprays each nostril one time per day   3. Continue immunotherapy  4. Continue Protonix  40 mg 1-2 times per day  5. Continue the following if needed:   A. Cetirizine  10 mg - 1 tablet 1-2 time per day    B. Albuterol  HFA - 2 inhalations every 6 hours  6. Influenza = Tamiflu. Covid = Paxlovid  7. Return to clinic in 6 months or earlier if problem

## 2024-01-05 NOTE — Progress Notes (Unsigned)
 [place] - [place] - [place] - [place] - [place]   Follow-up Note  Referring Provider: Dayna Motto, DO Primary Provider: Dayna Motto, DO Date of Office Visit: 01/05/2024  Subjective:   Arthur Moore (DOB: 09/14/1950) is a 73 y.o. male who [facility] in re-evaluation of the following:  HPI: Keatin returns to this clinic in evaluation of asthma, allergic rhinitis, laryngopharyngeal reflux, and a pruritic disorder.  I last saw him in this clinic 07 July 2023.  He visited with our nurse practitioner on 18 Sep 2023 with a viral induced respiratory tract flare requiring systemic steroids and antibiotics.  He finally resolved that horrendous viral infection and since that point in time he has really done very well with minimal use of his short acting bronchodilator and no additional systemic steroids or antibiotics required to control his asthma.  He does continue on Symbicort  twice a day on a regular basis.  And has had very little issues with his nose at this point in time.  He continues on immunotherapy every 4 weeks without any adverse effect.  He believes that his asthma is under very good control while using Protonix  mostly twice a day.  He is having a left shoulder surgery in October 2025 and then after that is completed he needs to have his right elbow looked at as he apparently tore a ligament.  He has had bilateral cataract removal and also had removal of floaters from his eye this summer.  Allergies as of 01/05/2024       Reactions   Pollen Extract Itching        Medication List    albuterol  108 (90 Base) MCG/ACT inhaler Commonly known as: VENTOLIN  HFA Inhale 2 puffs into the lungs every 4 (four) hours as needed for wheezing or shortness of breath.   albuterol  (2.5 MG/3ML) 0.083% nebulizer solution Commonly known as: PROVENTIL  Take 3 mLs (2.5 mg total) by nebulization as needed for wheezing or shortness of  breath.   aspirin  EC 81 MG tablet Take 81 mg by mouth daily.   budesonide  0.25 MG/2ML nebulizer solution Commonly known as: PULMICORT  Take 2 mLs (0.25 mg total) by nebulization every 4 (four) hours as needed (During flare ups).   budesonide -formoterol  160-4.5 MCG/ACT inhaler Commonly known as: SYMBICORT  Inhale 2 puffs into the lungs 2 (two) times daily. With spacer (empty lungs)   busPIRone  10 MG tablet Commonly known as: BUSPAR  Take 10 mg by mouth 2 (two) times daily.   cetirizine  10 MG tablet Commonly known as: ZyrTEC  Allergy Take 1 tablet (10 mg total) by mouth daily as needed (can take an extra dose during flare ups).   Cholecalciferol  125 MCG (5000 UT) Tabs Take 5,000 Units by mouth every evening.   fluticasone  50 MCG/ACT nasal spray Commonly known as: FLONASE  Place 2 sprays into both nostrils daily. 1-2 sprays each nostril 1 (ONE) time per day.   lamoTRIgine  200 MG tablet Commonly known as: LAMICTAL  Take 1 tablet (200 mg total) by mouth 2 (two) times daily.   metoprolol  tartrate 25 MG tablet Commonly known as: LOPRESSOR  TAKE 1/2 TABLET BY MOUTH TWICE A DAY   Nebulizer Mask Adult Misc 1 Device by Does not apply route as directed.   nitroGLYCERIN  0.4 MG SL tablet Commonly known as: NITROSTAT  Place 0.4 mg under the tongue every 5 (five) minutes as needed for chest pain.   pantoprazole  40 MG tablet Commonly known as: Protonix  Take 1 tablet (40 mg  total) by mouth 2 (two) times daily.   REFRESH OP Place 1 drop into both eyes 3 (three) times daily.   rosuvastatin  40 MG tablet Commonly known as: CRESTOR  Take 20 mg by mouth every evening.   sertraline  100 MG tablet Commonly known as: ZOLOFT  Take 100 mg by mouth in the morning.   Spacer/Aero-[employer]olding [employer] 1 Device by Does not apply route as directed.    Past Medical History:  Diagnosis Date   Aortic insufficiency    Arthritis    Asthma    CAD (coronary artery disease)    Carotid bruit 07/09/2021    carotid ultrasound near normal 2023   Cervical disc disease    Depression    Diastolic dysfunction 06/20/2016   Hyperlipidemia 08/24/2015   Hypertension    Lumbar disc disease     Past Surgical History:  Procedure Laterality Date   CHOLECYSTECTOMY  2012   HERNIA REPAIR     KNEE ARTHROSCOPY W/ MENISCAL REPAIR  11/2021   LEFT HEART CATH AND CORONARY ANGIOGRAPHY N/A 07/08/2018   Procedure: LEFT HEART CATH AND CORONARY ANGIOGRAPHY;  Surgeon: Verlin Lonni BIRCH, MD;  [facility]: [facility];  Service: Cardiovascular;  Laterality: N/A;   LEFT HEART CATH AND CORONARY ANGIOGRAPHY N/A 11/12/2023   Procedure: LEFT HEART CATH AND CORONARY ANGIOGRAPHY;  Surgeon: Ladona Heinz, MD;  Location: [facility];  Service: Cardiovascular;  Laterality: N/A;   PAROTIDECTOMY Right 1992   PROSTATE SURGERY  2002, 2004   SINOSCOPY     SINUS EXPLORATION  2013   SPINE SURGERY  2013   L C7-T1 laminectomy and discectomy   TONSILLECTOMY      Review of systems negative except as noted in HPI / PMHx or noted below:  Review of Systems  Constitutional: Negative.   HENT: Negative.    Eyes: Negative.   Respiratory: Negative.    Cardiovascular: Negative.   Gastrointestinal: Negative.   Genitourinary: Negative.   Musculoskeletal: Negative.   Skin: Negative.   Neurological: Negative.   Endo/Heme/Allergies: Negative.   Psychiatric/Behavioral: Negative.       Objective:   Vitals:   01/05/24 1109  BP: 134/78  Pulse: 62  Resp: 16  Temp: 98.5 F (36.9 C)  SpO2: 94%   Height: 5' 11 (180.3 cm)  Weight: 193 lb 6.4 oz (87.7 kg)   Physical Exam Constitutional:      Appearance: He is not diaphoretic.  HENT:     Head: Normocephalic.     Right Ear: Tympanic membrane, ear canal and external ear normal.     Left Ear: Tympanic membrane, ear canal and external ear normal.     Nose: Nose normal. No mucosal edema or rhinorrhea.     Mouth/Throat:     Pharynx: Uvula midline. No oropharyngeal  exudate.  Eyes:     Conjunctiva/sclera: Conjunctivae normal.  Neck:     Thyroid : No thyromegaly.     Trachea: Trachea normal. No tracheal tenderness or tracheal deviation.  Cardiovascular:     Rate and Rhythm: Normal rate and regular rhythm.     Heart sounds: Normal heart sounds, S1 normal and S2 normal. No murmur heard. Pulmonary:     Effort: No respiratory distress.     Breath sounds: Normal breath sounds. No stridor. No wheezing or rales.  Lymphadenopathy:     Head:     Right side of head: No tonsillar adenopathy.     Left side of head: No tonsillar adenopathy.  Cervical: No cervical adenopathy.  Skin:    Findings: No erythema or rash.     Nails: There is no clubbing.  Neurological:     Mental Status: He is alert.     Diagnostics: none  Assessment and Plan:   1. Asthma, moderate persistent, well-controlled   2. Moderate persistent asthma with (acute) exacerbation   3. Other allergic rhinitis   4. LPRD (laryngopharyngeal reflux disease)   5. Allergic rhinitis, unspecified seasonality, unspecified trigger    1. Continue Symbicort  160 - 2 puffs twice a day with spacer (empty lungs)  2. Continue Flonase  1-2 sprays each nostril one time per day   3. Continue immunotherapy  4. Continue Protonix  40 mg 1-2 times per day  5. Continue the following if needed:   A. Cetirizine  10 mg - 1 tablet 1-2 time per day    B. Albuterol  HFA - 2 inhalations every 6 hours  6. Influenza = Tamiflu. Covid = Paxlovid  7. Return to clinic in 6 months or earlier if problem  Huy appears to be doing very well at this point regarding his asthma and his allergic rhinitis and his LPR on his current therapy of utilizing anti-inflammatory agents for his airway on a pretty consistent basis as well as using a proton pump inhibitor.  I am not going to change much of his therapy today.  Assuming he does well with this plan I will see him back in his clinic in 6 months or earlier if there is a  problem.  Camellia Denis, MD Allergy / Immunology [facility]

## 2024-01-06 ENCOUNTER — Encounter: Payer: Self-pay | Admitting: Allergy and Immunology

## 2024-01-11 ENCOUNTER — Ambulatory Visit (INDEPENDENT_AMBULATORY_CARE_PROVIDER_SITE_OTHER)

## 2024-01-11 DIAGNOSIS — J309 Allergic rhinitis, unspecified: Secondary | ICD-10-CM | POA: Diagnosis not present

## 2024-01-12 DIAGNOSIS — L570 Actinic keratosis: Secondary | ICD-10-CM | POA: Diagnosis not present

## 2024-01-12 DIAGNOSIS — H43393 Other vitreous opacities, bilateral: Secondary | ICD-10-CM | POA: Diagnosis not present

## 2024-01-12 DIAGNOSIS — L578 Other skin changes due to chronic exposure to nonionizing radiation: Secondary | ICD-10-CM | POA: Diagnosis not present

## 2024-01-12 DIAGNOSIS — W908XXS Exposure to other nonionizing radiation, sequela: Secondary | ICD-10-CM | POA: Diagnosis not present

## 2024-01-12 DIAGNOSIS — L821 Other seborrheic keratosis: Secondary | ICD-10-CM | POA: Diagnosis not present

## 2024-01-12 DIAGNOSIS — Z9889 Other specified postprocedural states: Secondary | ICD-10-CM | POA: Diagnosis not present

## 2024-01-13 ENCOUNTER — Telehealth: Payer: Self-pay

## 2024-01-13 NOTE — Telephone Encounter (Signed)
 Arthur Moore was wanting a call in September about upcoming classes, I left him a message about the upcoming class at [place] and that [name] will have classes around November. I also said we would call him again with dates for [facility] classes when set.

## 2024-01-19 ENCOUNTER — Telehealth: Payer: Self-pay | Admitting: Cardiovascular Disease

## 2024-01-19 ENCOUNTER — Encounter (HOSPITAL_BASED_OUTPATIENT_CLINIC_OR_DEPARTMENT_OTHER): Payer: Self-pay | Admitting: Cardiovascular Disease

## 2024-01-19 NOTE — Telephone Encounter (Signed)
 Pt c/o medication issue:  1. Name of Medication:  Ibuprofen   2. How are you currently taking this medication (dosage and times per day)?   3. Are you having a reaction (difficulty breathing--STAT)?   4. What is your medication issue?   Patient says he has been having jaw pain and tried Tylenol  but it didn't help. Ibuprofen  is the only medication that seemed to help. Patient says he remembers Dr. Raford advising to be careful and monitor Ibuprofen  intake. Patient would like to know how much he can take and how often. Please advise.

## 2024-01-19 NOTE — Telephone Encounter (Signed)
 Reviewed with Reche Finder, NP- who recommends ibuprofen  400 BID w food for no more than 3 days.  He and his wife voice understanding and agreement.  He has been trying a dental appliance instead of CPAP for sleep apnea.  He slept w it the whole night and it has caused a lot of discomfort.  He plans to stop the appliance altogether for now and then use very short periods and work up to a full night.  He is also using ice and massage.

## 2024-01-22 DIAGNOSIS — I700 Atherosclerosis of aorta: Secondary | ICD-10-CM | POA: Diagnosis not present

## 2024-01-22 DIAGNOSIS — I10 Essential (primary) hypertension: Secondary | ICD-10-CM | POA: Diagnosis not present

## 2024-01-22 DIAGNOSIS — I25118 Atherosclerotic heart disease of native coronary artery with other forms of angina pectoris: Secondary | ICD-10-CM | POA: Diagnosis not present

## 2024-01-25 DIAGNOSIS — M26602 Left temporomandibular joint disorder, unspecified: Secondary | ICD-10-CM | POA: Diagnosis not present

## 2024-01-25 DIAGNOSIS — H6692 Otitis media, unspecified, left ear: Secondary | ICD-10-CM | POA: Diagnosis not present

## 2024-01-26 DIAGNOSIS — F411 Generalized anxiety disorder: Secondary | ICD-10-CM | POA: Diagnosis not present

## 2024-01-26 DIAGNOSIS — E7800 Pure hypercholesterolemia, unspecified: Secondary | ICD-10-CM | POA: Diagnosis not present

## 2024-01-26 DIAGNOSIS — I25118 Atherosclerotic heart disease of native coronary artery with other forms of angina pectoris: Secondary | ICD-10-CM | POA: Diagnosis not present

## 2024-01-26 DIAGNOSIS — I700 Atherosclerosis of aorta: Secondary | ICD-10-CM | POA: Diagnosis not present

## 2024-01-26 DIAGNOSIS — I10 Essential (primary) hypertension: Secondary | ICD-10-CM | POA: Diagnosis not present

## 2024-01-29 DIAGNOSIS — R1013 Epigastric pain: Secondary | ICD-10-CM | POA: Diagnosis not present

## 2024-01-29 DIAGNOSIS — K31A0 Gastric intestinal metaplasia, unspecified: Secondary | ICD-10-CM | POA: Diagnosis not present

## 2024-01-29 DIAGNOSIS — Z860100 Personal history of colon polyps, unspecified: Secondary | ICD-10-CM | POA: Diagnosis not present

## 2024-01-29 DIAGNOSIS — K219 Gastro-esophageal reflux disease without esophagitis: Secondary | ICD-10-CM | POA: Diagnosis not present

## 2024-01-29 DIAGNOSIS — K7460 Unspecified cirrhosis of liver: Secondary | ICD-10-CM | POA: Diagnosis not present

## 2024-02-01 ENCOUNTER — Ambulatory Visit: Payer: Self-pay | Admitting: Cardiovascular Disease

## 2024-02-02 DIAGNOSIS — Z9889 Other specified postprocedural states: Secondary | ICD-10-CM | POA: Diagnosis not present

## 2024-02-02 DIAGNOSIS — H43393 Other vitreous opacities, bilateral: Secondary | ICD-10-CM | POA: Diagnosis not present

## 2024-02-02 DIAGNOSIS — H43813 Vitreous degeneration, bilateral: Secondary | ICD-10-CM | POA: Diagnosis not present

## 2024-02-08 ENCOUNTER — Ambulatory Visit

## 2024-02-08 DIAGNOSIS — J309 Allergic rhinitis, unspecified: Secondary | ICD-10-CM | POA: Diagnosis not present

## 2024-02-09 DIAGNOSIS — G2581 Restless legs syndrome: Secondary | ICD-10-CM | POA: Diagnosis not present

## 2024-02-09 DIAGNOSIS — R251 Tremor, unspecified: Secondary | ICD-10-CM | POA: Diagnosis not present

## 2024-02-15 ENCOUNTER — Telehealth: Payer: Self-pay

## 2024-02-15 DIAGNOSIS — H53483 Generalized contraction of visual field, bilateral: Secondary | ICD-10-CM | POA: Diagnosis not present

## 2024-02-15 NOTE — Telephone Encounter (Signed)
 Left message about upcoming PREP Class at [facility] and asked to return my call.

## 2024-02-16 DIAGNOSIS — M66812 Spontaneous rupture of other tendons, left shoulder: Secondary | ICD-10-CM | POA: Diagnosis not present

## 2024-02-16 DIAGNOSIS — M7542 Impingement syndrome of left shoulder: Secondary | ICD-10-CM | POA: Diagnosis not present

## 2024-02-16 DIAGNOSIS — M24112 Other articular cartilage disorders, left shoulder: Secondary | ICD-10-CM | POA: Diagnosis not present

## 2024-02-16 DIAGNOSIS — G8918 Other acute postprocedural pain: Secondary | ICD-10-CM | POA: Diagnosis not present

## 2024-02-16 DIAGNOSIS — M94212 Chondromalacia, left shoulder: Secondary | ICD-10-CM | POA: Diagnosis not present

## 2024-02-16 DIAGNOSIS — S43492A Other sprain of left shoulder joint, initial encounter: Secondary | ICD-10-CM | POA: Diagnosis not present

## 2024-02-16 DIAGNOSIS — M65912 Unspecified synovitis and tenosynovitis, left shoulder: Secondary | ICD-10-CM | POA: Diagnosis not present

## 2024-02-16 DIAGNOSIS — Y999 Unspecified external cause status: Secondary | ICD-10-CM | POA: Diagnosis not present

## 2024-02-16 DIAGNOSIS — M19012 Primary osteoarthritis, left shoulder: Secondary | ICD-10-CM | POA: Diagnosis not present

## 2024-02-16 DIAGNOSIS — X58XXXA Exposure to other specified factors, initial encounter: Secondary | ICD-10-CM | POA: Diagnosis not present

## 2024-02-24 ENCOUNTER — Telehealth: Payer: Self-pay

## 2024-02-24 NOTE — Telephone Encounter (Signed)
 Left message about upcoming PREP class on November 10 at [facility] and asked him to return my call.

## 2024-02-25 ENCOUNTER — Telehealth: Payer: Self-pay

## 2024-02-25 NOTE — Therapy (Signed)
 OUTPATIENT PHYSICAL THERAPY SHOULDER EVALUATION   Patient Name: Arthur Moore MRN: [id number] DOB:Nov 23, 1950, 73 y.o., male Today's Date: 02/26/2024  END OF SESSION:  PT End of Session - 02/26/24 0845     Visit Number 1    Date for Recertification  05/28/24    Authorization Type Medicare    PT Start Time 0844    PT Stop Time 0930    PT Time Calculation (min) 46 min    Activity Tolerance Patient tolerated treatment well    Behavior During Therapy [facility] for tasks assessed/performed          Past Medical History:  Diagnosis Date   Aortic insufficiency    Arthritis    Asthma    CAD (coronary artery disease)    Carotid bruit 07/09/2021   carotid ultrasound near normal 2023   Cervical disc disease    Depression    Diastolic dysfunction 06/20/2016   Hyperlipidemia 08/24/2015   Hypertension    Lumbar disc disease    Past Surgical History:  Procedure Laterality Date   CHOLECYSTECTOMY  2012   HERNIA REPAIR     KNEE ARTHROSCOPY W/ MENISCAL REPAIR  11/2021   LEFT HEART CATH AND CORONARY ANGIOGRAPHY N/A 07/08/2018   Procedure: LEFT HEART CATH AND CORONARY ANGIOGRAPHY;  Surgeon: Verlin Lonni BIRCH, MD;  [facility];  Service: Cardiovascular;  Laterality: N/A;   LEFT HEART CATH AND CORONARY ANGIOGRAPHY N/A 11/12/2023   Procedure: LEFT HEART CATH AND CORONARY ANGIOGRAPHY;  Surgeon: Ladona Heinz, MD;  [facility];  Service: Cardiovascular;  Laterality: N/A;   PAROTIDECTOMY Right 1992   PROSTATE SURGERY  2002, 2004   SINOSCOPY     SINUS EXPLORATION  2013   SPINE SURGERY  2013   L C7-T1 laminectomy and discectomy   TONSILLECTOMY     Patient Active Problem List   Diagnosis Date Noted   Exertional dyspnea 11/12/2023   Unstable angina (HCC) 11/11/2023   Chest pain 11/10/2023   Snoring 02/14/2022   Mild sleep apnea 02/12/2022   Carotid bruit 07/09/2021   Aneurysm of ascending aorta 06/28/2020   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   CAD (coronary artery disease) 07/09/2018   CKD (chronic kidney disease), 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   Chest pain of uncertain etiology 07/08/2018   Chronic cough 06/21/2018   Thrombocytopenia 12/01/2016   Transaminitis 12/01/2016   Diastolic dysfunction 06/20/2016   MDD (major depressive disorder), recurrent severe, 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   Hyperlipidemia 08/24/2015   Arthritis    Depression    Hypertension    Aortic insufficiency    Psoriasis    Cervical disc disease    Lumbar disc disease    Asthma 01/06/2015   Allergic rhinoconjunctivitis 01/06/2015   GERD (gastroesophageal reflux disease) 01/06/2015   Laryngopharyngeal reflux (LPR) 01/06/2015    PCP: dayna, DO  REFERRING PROVIDER: Supple, MD  REFERRING DIAG: S/P   THERAPY DIAG:  Acute pain of left shoulder  Stiffness of left shoulder, not elsewhere classified  Localized edema  Rationale for Evaluation and Treatment: Rehabilitation  ONSET DATE: 02/16/24  SUBJECTIVE:  SUBJECTIVE STATEMENT: Patient underwent a left shoulder DCR/SAD and RCR on 02/16/24.  Patient reports that the left shoulder has been hurting since 2019.   Hand dominance: Right  PERTINENT HISTORY: See above  PAIN:  Are you having pain? Yes: NPRS scale: 6/10 Pain location: left shoulder Pain description: ache, dull Aggravating factors: pain up to 8/10 at night and with use Relieving factors: pain meds, rest, ice at best 3/10  PRECAUTIONS: Note to provider: s/p gentle passive ROM 1x/wk for 4 weeks per protocol sx 10/21 lt sa,sad,dcr PT to start 7-10 days after DOS please schedule PT and post op Eval and treat s/p Rotator Cuff Repair 1x/wk for 4 weeks Exercises: per PROTOCOL- gental passive ROM Modalities: per PROTOCOL   RED  FLAGS: None   WEIGHT BEARING RESTRICTIONS: No  FALLS:  Has patient fallen in last 6 months? No  LIVING ENVIRONMENT: Lives with: lives with their family Lives in: House/apartment Stairs: No Has following equipment at home: None  OCCUPATION: retired  PLOF: Independent and does yard work, does have right elbow pain, he was playing pickleball 2x/week  PATIENT GOALS:have no pain, play pickle ball work out  NEXT MD VISIT: Next week Nov 3  OBJECTIVE:  Note: Objective measures were completed at Evaluation unless otherwise noted.  DIAGNOSTIC FINDINGS:  RC tear  COGNITION: Overall cognitive status: Within functional limits for tasks assessed     SENSATION: WFL  POSTURE: Fwd head, rounded shoulders  UPPER EXTREMITY ROM:   Passive ROM Right eval Left eval  Shoulder flexion  80  Shoulder extension    Shoulder abduction  85  Shoulder adduction    Shoulder internal rotation  30  Shoulder external rotation  40  Elbow flexion  WFL  Elbow extension  WFL  Wrist flexion    Wrist extension    Wrist ulnar deviation    Wrist radial deviation    Wrist pronation    Wrist supination    (Blank rows = not tested)  UPPER EXTREMITY MMT: N/T due to surgery  MMT Right eval Left eval  Shoulder flexion    Shoulder extension    Shoulder abduction    Shoulder adduction    Shoulder internal rotation    Shoulder external rotation    Middle trapezius    Lower trapezius    Elbow flexion    Elbow extension    Wrist flexion    Wrist extension    Wrist ulnar deviation    Wrist radial deviation    Wrist pronation    Wrist supination    Grip strength (lbs)    (Blank rows = not tested)  PALPATION:  He is very tender in the trap, neck, shoulder and upper arm, has steristrips in place, significant ecchymosis                                                                                                                             TREATMENT DATE:   02/26/24  Evaluation Education on sling, protocol precautions, he is doing too much now if he had an RCR   PATIENT EDUCATION: Education details: POC/HEP Person educated: Patient Education method: Programmer, Multimedia, Demonstration, Verbal cues, and Handouts Education comprehension: verbalized understanding  HOME EXERCISE PROGRAM: Access Code: [email or web address] URL: [email or web address] Date: 02/26/2024 Prepared by: Ozell Mainland  Exercises - Seated Shoulder Flexion Towel Slide at Table Top Full Range of Motion  - 2 x daily - 7 x weekly - 1 sets - 10 reps - 10 hold - Seated Shoulder External Rotation PROM on Table  - 2 x daily - 7 x weekly - 1 sets - 10 reps - 10 hold - Seated Shoulder Shrugs  - 2 x daily - 7 x weekly - 3 sets - 10 reps - 3 hold - Seated Scapular Retraction  - 2 x daily - 7 x weekly - 3 sets - 10 reps - 3 hold  ASSESSMENT:  CLINICAL IMPRESSION: Patient is a 73 y.o. male who was seen today for physical therapy evaluation and treatment for left shoulder DCR, SAD. In the MD order it talks about an RCR, if that is the case we will follow RCR protocol, he sees the MD next Monday and I asked him to find out about the RCR.  OBJECTIVE IMPAIRMENTS: cardiopulmonary status limiting activity, decreased activity tolerance, decreased coordination, decreased endurance, decreased ROM, decreased strength, increased edema, increased fascial restrictions, increased muscle spasms, impaired flexibility, impaired UE functional use, improper body mechanics, postural dysfunction, and pain.   REHAB POTENTIAL: Good  CLINICAL DECISION MAKING: Evolving/moderate complexity  EVALUATION COMPLEXITY: Low   GOALS: Goals reviewed with patient? Yes  SHORT TERM GOALS: Target date: 03/21/24  Independent with initial HEP Baseline: Goal status: INITIAL  LONG TERM GOALS: Target date: 05/28/24  Independent with advanced HEP Baseline:  Goal status: INITIAL  2.  Decrease pain 50% Baseline:   Goal status: INITIAL  3.  Increase AROM of the left shoulder to WFL's for all motions Baseline:  Goal status: INITIAL  4.  Lift 3# overhead Baseline:  Goal status: INITIAL  5.  Decrease pain 50% Baseline:  Goal status: INITIAL   PLAN:  PT FREQUENCY: 1-2x/week  PT DURATION: 12 weeks  PLANNED INTERVENTIONS: 97164- PT Re-evaluation, 97110-Therapeutic exercises, 97530- Therapeutic activity, 97112- Neuromuscular re-education, 97535- Self Care, [id number]- Manual therapy, [id number]- Electrical stimulation (unattended), 97016- Vasopneumatic device, Patient/Family education, Taping, Joint mobilization, and Cryotherapy  PLAN FOR NEXT SESSION: See what the MD said   MAINLAND OZELL ORN, PT 02/26/2024, 8:46 AM

## 2024-02-25 NOTE — Telephone Encounter (Signed)
 Arthur Moore left me a message saying he was interested in taking a class soon but needs to wait a bit. He asked for us  to call again at the beginning of December.

## 2024-02-26 ENCOUNTER — Ambulatory Visit: Attending: Orthopedic Surgery | Admitting: Physical Therapy

## 2024-02-26 DIAGNOSIS — I25118 Atherosclerotic heart disease of native coronary artery with other forms of angina pectoris: Secondary | ICD-10-CM | POA: Diagnosis not present

## 2024-02-26 DIAGNOSIS — I10 Essential (primary) hypertension: Secondary | ICD-10-CM | POA: Diagnosis not present

## 2024-02-26 DIAGNOSIS — F411 Generalized anxiety disorder: Secondary | ICD-10-CM | POA: Diagnosis not present

## 2024-02-26 DIAGNOSIS — I700 Atherosclerosis of aorta: Secondary | ICD-10-CM | POA: Diagnosis not present

## 2024-02-26 DIAGNOSIS — M25512 Pain in left shoulder: Secondary | ICD-10-CM | POA: Diagnosis not present

## 2024-02-26 DIAGNOSIS — R600 Localized edema: Secondary | ICD-10-CM | POA: Diagnosis not present

## 2024-02-26 DIAGNOSIS — M25612 Stiffness of left shoulder, not elsewhere classified: Secondary | ICD-10-CM | POA: Diagnosis not present

## 2024-02-26 DIAGNOSIS — E7800 Pure hypercholesterolemia, unspecified: Secondary | ICD-10-CM | POA: Diagnosis not present

## 2024-02-29 ENCOUNTER — Ambulatory Visit

## 2024-03-04 ENCOUNTER — Ambulatory Visit: Attending: Orthopedic Surgery | Admitting: Physical Therapy

## 2024-03-04 DIAGNOSIS — R600 Localized edema: Secondary | ICD-10-CM | POA: Insufficient documentation

## 2024-03-04 DIAGNOSIS — M25612 Stiffness of left shoulder, not elsewhere classified: Secondary | ICD-10-CM | POA: Insufficient documentation

## 2024-03-04 DIAGNOSIS — M25512 Pain in left shoulder: Secondary | ICD-10-CM | POA: Insufficient documentation

## 2024-03-07 ENCOUNTER — Ambulatory Visit

## 2024-03-07 DIAGNOSIS — J309 Allergic rhinitis, unspecified: Secondary | ICD-10-CM | POA: Diagnosis not present

## 2024-03-08 ENCOUNTER — Ambulatory Visit: Admitting: Physical Therapy

## 2024-03-08 ENCOUNTER — Encounter: Payer: Self-pay | Admitting: Physical Therapy

## 2024-03-08 DIAGNOSIS — M25512 Pain in left shoulder: Secondary | ICD-10-CM

## 2024-03-08 DIAGNOSIS — R600 Localized edema: Secondary | ICD-10-CM

## 2024-03-08 DIAGNOSIS — M25612 Stiffness of left shoulder, not elsewhere classified: Secondary | ICD-10-CM

## 2024-03-08 NOTE — Therapy (Signed)
 OUTPATIENT PHYSICAL THERAPY SHOULDER EVALUATION   Patient Name: Arthur Moore MRN: [id number] DOB:1950-06-25, 73 y.o., male Today's Date: 03/08/2024  END OF SESSION:  PT End of Session - 03/08/24 1102     Visit Number 2    Date for Recertification  05/28/24    PT Start Time 1102    PT Stop Time 1156    PT Time Calculation (min) 54 min    Activity Tolerance Patient tolerated treatment well    Behavior During Therapy [facility] for tasks assessed/performed          Past Medical History:  Diagnosis Date   Aortic insufficiency    Arthritis    Asthma    CAD (coronary artery disease)    Carotid bruit 07/09/2021   carotid ultrasound near normal 2023   Cervical disc disease    Depression    Diastolic dysfunction 06/20/2016   Hyperlipidemia 08/24/2015   Hypertension    Lumbar disc disease    Past Surgical History:  Procedure Laterality Date   CHOLECYSTECTOMY  2012   HERNIA REPAIR     KNEE ARTHROSCOPY W/ MENISCAL REPAIR  11/2021   LEFT HEART CATH AND CORONARY ANGIOGRAPHY N/A 07/08/2018   Procedure: LEFT HEART CATH AND CORONARY ANGIOGRAPHY;  Surgeon: Verlin Lonni BIRCH, MD;  Location: [facility];  Service: Cardiovascular;  Laterality: N/A;   LEFT HEART CATH AND CORONARY ANGIOGRAPHY N/A 11/12/2023   Procedure: LEFT HEART CATH AND CORONARY ANGIOGRAPHY;  Surgeon: Ladona Heinz, MD;  Location: [facility];  Service: Cardiovascular;  Laterality: N/A;   PAROTIDECTOMY Right 1992   PROSTATE SURGERY  2002, 2004   SINOSCOPY     SINUS EXPLORATION  2013   SPINE SURGERY  2013   L C7-T1 laminectomy and discectomy   TONSILLECTOMY     Patient Active Problem List   Diagnosis Date Noted   Exertional dyspnea 11/12/2023   Unstable angina (HCC) 11/11/2023   Chest pain 11/10/2023   Snoring 02/14/2022   Mild sleep apnea 02/12/2022   Carotid bruit 07/09/2021   Aneurysm of ascending aorta 06/28/2020   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   CAD  (coronary artery disease) 07/09/2018   CKD (chronic kidney disease), 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   Chest pain of uncertain etiology 07/08/2018   Chronic cough 06/21/2018   Thrombocytopenia 12/01/2016   Transaminitis 12/01/2016   Diastolic dysfunction 06/20/2016   MDD (major depressive disorder), recurrent severe, 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   Hyperlipidemia 08/24/2015   Arthritis    Depression    Hypertension    Aortic insufficiency    Psoriasis    Cervical disc disease    Lumbar disc disease    Asthma 01/06/2015   Allergic rhinoconjunctivitis 01/06/2015   GERD (gastroesophageal reflux disease) 01/06/2015   Laryngopharyngeal reflux (LPR) 01/06/2015    PCP: dayna, DO  REFERRING PROVIDER: Supple, MD  REFERRING DIAG: S/P   THERAPY DIAG:  Acute pain of left shoulder  Stiffness of left shoulder, not elsewhere classified  Localized edema  Rationale for Evaluation and Treatment: Rehabilitation  ONSET DATE: 02/16/24  SUBJECTIVE:  SUBJECTIVE STATEMENT:  Hurt No a RCR, free to do anything within tolerance   Patient underwent a left shoulder DCR/SAD and RCR on 02/16/24.  Patient reports that the left shoulder has been hurting since 2019.   Hand dominance: Right  PERTINENT HISTORY: See above  PAIN:  Are you having pain? Yes: NPRS scale: 6/10 Pain location: left shoulder Pain description: ache, dull Aggravating factors: pain up to 8/10 at night and with use Relieving factors: pain meds, rest, ice at best 3/10  PRECAUTIONS: Note to provider: s/p gentle passive ROM 1x/wk for 4 weeks per protocol sx 10/21 lt sa,sad,dcr PT to start 7-10 days after DOS please schedule PT and post op Eval and treat s/p Rotator Cuff Repair 1x/wk for 4 weeks Exercises: per PROTOCOL- gental passive ROM Modalities: per  PROTOCOL   RED FLAGS: None   WEIGHT BEARING RESTRICTIONS: No  FALLS:  Has patient fallen in last 6 months? No  LIVING ENVIRONMENT: Lives with: lives with their family Lives in: House/apartment Stairs: No Has following equipment at home: None  OCCUPATION: retired  PLOF: Independent and does yard work, does have right elbow pain, he was playing pickleball 2x/week  PATIENT GOALS:have no pain, play pickle ball work out  NEXT MD VISIT: Next week Nov 3  OBJECTIVE:  Note: Objective measures were completed at Evaluation unless otherwise noted.  DIAGNOSTIC FINDINGS:  RC tear  COGNITION: Overall cognitive status: Within functional limits for tasks assessed     SENSATION: WFL  POSTURE: Fwd head, rounded shoulders  UPPER EXTREMITY ROM:   Passive ROM Right eval Left eval AROM Left 03/08/24  Shoulder flexion  80 wfl  Shoulder extension     Shoulder abduction  85 170  Shoulder adduction     Shoulder internal rotation  30   Shoulder external rotation  40   Elbow flexion  WFL   Elbow extension  WFL   Wrist flexion     Wrist extension     Wrist ulnar deviation     Wrist radial deviation     Wrist pronation     Wrist supination     (Blank rows = not tested)  UPPER EXTREMITY MMT: N/T due to surgery  MMT Right eval Left eval  Shoulder flexion    Shoulder extension    Shoulder abduction    Shoulder adduction    Shoulder internal rotation    Shoulder external rotation    Middle trapezius    Lower trapezius    Elbow flexion    Elbow extension    Wrist flexion    Wrist extension    Wrist ulnar deviation    Wrist radial deviation    Wrist pronation    Wrist supination    Grip strength (lbs)    (Blank rows = not tested)  PALPATION:  He is very tender in the trap, neck, shoulder and upper arm, has steristrips in place, significant ecchymosis  TREATMENT DATE:  03/08/24 NuStep L 5 x 6 min AAROM Flex, Ext, IR WaTE 2lb 2x10 Shoulder abd 2lb 2x10 Shoulder Flex 2lb 2x10  LUE ER/IR red 2x10 Shoulder Ext 5lb 2x10 LUE triceps 15lb 2x10 KUE Bicept curls 15lb 2x10 Vaso LUE x10 min  02/26/24 Evaluation Education on sling, protocol precautions, he is doing too much now if he had an RCR   PATIENT EDUCATION: Education details: POC/HEP Person educated: Patient Education method: Programmer, Multimedia, Facilities Manager, Verbal cues, and Handouts Education comprehension: verbalized understanding  HOME EXERCISE PROGRAM: Access Code: [email or web address] URL: [email or web address] Date: 02/26/2024 Prepared by: Ozell Mainland  Exercises - Seated Shoulder Flexion Towel Slide at Table Top Full Range of Motion  - 2 x daily - 7 x weekly - 1 sets - 10 reps - 10 hold - Seated Shoulder External Rotation PROM on Table  - 2 x daily - 7 x weekly - 1 sets - 10 reps - 10 hold - Seated Shoulder Shrugs  - 2 x daily - 7 x weekly - 3 sets - 10 reps - 3 hold - Seated Scapular Retraction  - 2 x daily - 7 x weekly - 3 sets - 10 reps - 3 hold  ASSESSMENT:  CLINICAL IMPRESSION: Patient is a 73 y.o. male who was seen today for physical therapy  treatment for left shoulder DCR, SAD. Pt went to MD Monday, No RCR, he his free to do all activities to tolerance. Progressed to scapular strength and stability. LUE pain and weakness with shoulder flexion and abduction. Tactiel cues needed to LUE with ER/IR to keep UE to his side. All interventions completed well.   OBJECTIVE IMPAIRMENTS: cardiopulmonary status limiting activity, decreased activity tolerance, decreased coordination, decreased endurance, decreased ROM, decreased strength, increased edema, increased fascial restrictions, increased muscle spasms, impaired flexibility, impaired UE functional use, improper body mechanics, postural dysfunction, and pain.   REHAB POTENTIAL: Good  CLINICAL DECISION  MAKING: Evolving/moderate complexity  EVALUATION COMPLEXITY: Low   GOALS: Goals reviewed with patient? Yes  SHORT TERM GOALS: Target date: 03/21/24  Independent with initial HEP Baseline: Goal status: ongoing 03/08/24  LONG TERM GOALS: Target date: 05/28/24  Independent with advanced HEP Baseline:  Goal status: INITIAL  2.  Decrease pain 50% Baseline:  Goal status: INITIAL  3.  Increase AROM of the left shoulder to WFL's for all motions Baseline:  Goal status: INITIAL  4.  Lift 3# overhead Baseline:  Goal status: INITIAL  5.  Decrease pain 50% Baseline:  Goal status: INITIAL   PLAN:  PT FREQUENCY: 1-2x/week  PT DURATION: 12 weeks  PLANNED INTERVENTIONS: 97164- PT Re-evaluation, 97110-Therapeutic exercises, 97530- Therapeutic activity, 97112- Neuromuscular re-education, 97535- Self Care, [id number]- Manual therapy, [id number]- Electrical stimulation (unattended), 97016- Vasopneumatic device, Patient/Family education, Taping, Joint mobilization, and Cryotherapy  PLAN FOR NEXT SESSION: See what the MD said   Tanda KANDICE Sorrow, PTA 03/08/2024, 11:49 AM

## 2024-03-09 ENCOUNTER — Encounter: Payer: Self-pay | Admitting: Emergency Medicine

## 2024-03-09 ENCOUNTER — Ambulatory Visit: Admission: EM | Admit: 2024-03-09 | Discharge: 2024-03-09 | Disposition: A | Discharge status: Home / Self Care

## 2024-03-09 DIAGNOSIS — H60392 Other infective otitis externa, left ear: Secondary | ICD-10-CM

## 2024-03-09 MED ORDER — CIPROFLOXACIN-DEXAMETHASONE 0.3-0.1 % OT SUSP
4.0000 [drp] | Freq: Two times a day (BID) | OTIC | 0 refills | Status: AC
Start: 2024-03-09 — End: ?

## 2024-03-09 NOTE — ED Triage Notes (Signed)
 Pt c/o left ear pain for a few weeks.

## 2024-03-09 NOTE — Discharge Instructions (Signed)
  1. Infective otitis externa of left ear (Primary) - ciprofloxacin-dexamethasone  (CIPRODEX) OTIC suspension; Place 4 drops into the left ear 2 (two) times daily.  Dispense: 7.5 mL; Refill: 0 - Follow-up with audiologist for further evaluation of hearing aids and to make sure that they are fitting properly. - As discussed wash your ears with peroxide and warm water 1-2 times weekly to prevent any secondary infection and accumulation of cerumen. - You may continue using fluticasone  eardrop for inflammation, however be careful with applying with Q-tip as this can cause micro scratches to the skin inside the ear which increases risk for infection -Continue to monitor symptoms for any change in severity if there is any escalation of current symptoms or development of new symptoms follow-up in ER or return to UC for further evaluation and management.

## 2024-03-09 NOTE — ED Provider Notes (Signed)
 [facility]  Note:  This document was prepared using Dragon voice recognition software and may include unintentional dictation errors.  MRN: [id number] DOB: [birth year]  Subjective:   Arthur Moore is a 73 y.o. male presenting for left ear tenderness and mild discomfort x 1 to 2 weeks.  Patient reports that he had an ear infection in that ear approximately 1 to 2 months ago but he thought symptoms had resolved.  Patient reports he wears hearing aids and has been consistently having problems with his left ear.  Patient denies any drainage, fever, loss of hearing, headache.  Patient denies using any over-the-counter eardrops or's medication to help with symptoms.  No current facility-administered medications for this encounter.  Current Outpatient Medications:    ciprofloxacin-dexamethasone  (CIPRODEX) OTIC suspension, Place 4 drops into the left ear 2 (two) times daily., Disp: 7.5 mL, Rfl: 0   albuterol  (PROVENTIL ) (2.5 MG/3ML) 0.083% nebulizer solution, Take 3 mLs (2.5 mg total) by nebulization as needed for wheezing or shortness of breath., Disp: 75 mL, Rfl: 2   albuterol  (VENTOLIN  HFA) 108 (90 Base) MCG/ACT inhaler, Inhale 2 puffs into the lungs every 4 (four) hours as needed for wheezing or shortness of breath., Disp: 18 each, Rfl: 1   aspirin  EC 81 MG tablet, Take 81 mg by mouth daily., Disp: , Rfl:    budesonide  (PULMICORT ) 0.25 MG/2ML nebulizer solution, Take 2 mLs (0.25 mg total) by nebulization every 4 (four) hours as needed (During flare ups)., Disp: 150 mL, Rfl: 1   budesonide -formoterol  (SYMBICORT ) 160-4.5 MCG/ACT inhaler, Inhale 2 puffs into the lungs 2 (two) times daily. With spacer (empty lungs), Disp: 30.6 g, Rfl: 1   busPIRone  (BUSPAR ) 10 MG tablet, Take 10 mg by mouth 2 (two) times daily., Disp: , Rfl:    cetirizine  (ZYRTEC  ALLERGY) 10 MG tablet, Take 1 tablet (10 mg total) by mouth daily as needed (can take an extra dose during flare ups)., Disp: 180  tablet, Rfl: 1   Cholecalciferol  125 MCG (5000 UT) TABS, Take 5,000 Units by mouth every evening., Disp: , Rfl:    fluticasone  (FLONASE ) 50 MCG/ACT nasal spray, Place 2 sprays into both nostrils daily. 1-2 sprays each nostril 1 (ONE) time per day., Disp: 48 g, Rfl: 1   lamoTRIgine  (LAMICTAL ) 200 MG tablet, Take 1 tablet (200 mg total) by mouth 2 (two) times daily., Disp: 180 tablet, Rfl: 3   metoprolol  tartrate (LOPRESSOR ) 25 MG tablet, TAKE 1/2 TABLET BY MOUTH TWICE A DAY, Disp: 90 tablet, Rfl: 3   nitroGLYCERIN  (NITROSTAT ) 0.4 MG SL tablet, Place 0.4 mg under the tongue every 5 (five) minutes as needed for chest pain., Disp: , Rfl:    pantoprazole  (PROTONIX ) 40 MG tablet, Take 1 tablet (40 mg total) by mouth 2 (two) times daily., Disp: 180 tablet, Rfl: 1   Polyvinyl Alcohol-Povidone (REFRESH OP), Place 1 drop into both eyes 3 (three) times daily., Disp: , Rfl:    Respiratory Therapy Supplies (NEBULIZER MASK ADULT) MISC, 1 Device by Does not apply route as directed., Disp: , Rfl:    rosuvastatin  (CRESTOR ) 40 MG tablet, Take 20 mg by mouth every evening., Disp: , Rfl:    sertraline  (ZOLOFT ) 100 MG tablet, Take 100 mg by mouth in the morning., Disp: , Rfl:    Spacer/Aero-Holding Chambers DEVI, 1 Device by Does not apply route as directed., Disp: , Rfl:    Allergies  Allergen Reactions   Pollen Extract Itching    Past Medical History:  Diagnosis Date  Aortic insufficiency    Arthritis    Asthma    CAD (coronary artery disease)    Carotid bruit 07/09/2021   carotid ultrasound near normal 2023   Cervical disc disease    Depression    Diastolic dysfunction 06/20/2016   Hyperlipidemia 08/24/2015   Hypertension    Lumbar disc disease      Past Surgical History:  Procedure Laterality Date   CHOLECYSTECTOMY  2012   HERNIA REPAIR     KNEE ARTHROSCOPY W/ MENISCAL REPAIR  11/2021   LEFT HEART CATH AND CORONARY ANGIOGRAPHY N/A 07/08/2018   Procedure: LEFT HEART CATH AND CORONARY  ANGIOGRAPHY;  Surgeon: Verlin Lonni BIRCH, MD;  Location: [facility];  Service: Cardiovascular;  Laterality: N/A;   LEFT HEART CATH AND CORONARY ANGIOGRAPHY N/A 11/12/2023   Procedure: LEFT HEART CATH AND CORONARY ANGIOGRAPHY;  Surgeon: Ladona Heinz, MD;  Location: [facility];  Service: Cardiovascular;  Laterality: N/A;   PAROTIDECTOMY Right 1992   PROSTATE SURGERY  2002, 2004   SINOSCOPY     SINUS EXPLORATION  2013   SPINE SURGERY  2013   L C7-T1 laminectomy and discectomy   TONSILLECTOMY      Family History  Problem Relation Age of Onset   Colon cancer Mother    Lung cancer Father    Bipolar disorder Brother    Cancer Other    Allergic rhinitis Neg Hx    Angioedema Neg Hx    Asthma Neg Hx    Eczema Neg Hx     Social History   Tobacco Use   Smoking status: Never   Smokeless tobacco: Never  Vaping Use   Vaping status: Never Used  Substance Use Topics   Alcohol use: Not Currently   Drug use: No    ROS Refer to HPI for ROS details.  Objective:   Vitals: BP 131/82 (BP Location: Right Arm)   Pulse 77   Temp 98.2 F (36.8 C) (Temporal)   Resp 16   SpO2 94%   Physical Exam Vitals and nursing note reviewed.  Constitutional:      General: He is not in acute distress.    Appearance: Normal appearance. He is well-developed. He is not ill-appearing or toxic-appearing.  HENT:     Head: Normocephalic.     Right Ear: Tympanic membrane, ear canal and external ear normal. No drainage, swelling or tenderness. Tympanic membrane is not injected, erythematous or bulging.     Left Ear: Tympanic membrane and external ear normal. Swelling and tenderness present. No drainage. Tympanic membrane is not injected, erythematous or bulging.  Cardiovascular:     Rate and Rhythm: Normal rate.  Pulmonary:     Effort: Pulmonary effort is normal. No respiratory distress.  Skin:    General: Skin is warm and dry.  Neurological:     General: No focal deficit present.      Mental Status: He is alert and oriented to person, place, and time.  Psychiatric:        Mood and Affect: Mood normal.        Behavior: Behavior normal.     Procedures  No results found for this or any previous visit (from the past 24 hours).  No results found.   Assessment and Plan :     Discharge Instructions       1. Infective otitis externa of left ear (Primary) - ciprofloxacin-dexamethasone  (CIPRODEX) OTIC suspension; Place 4 drops into the left ear 2 (two) times  daily.  Dispense: 7.5 mL; Refill: 0 - Follow-up with audiologist for further evaluation of hearing aids and to make sure that they are fitting properly. - As discussed wash your ears with peroxide and warm water 1-2 times weekly to prevent any secondary infection and accumulation of cerumen. - You may continue using fluticasone  eardrop for inflammation, however be careful with applying with Q-tip as this can cause micro scratches to the skin inside the ear which increases risk for infection -Continue to monitor symptoms for any change in severity if there is any escalation of current symptoms or development of new symptoms follow-up in ER or return to UC for further evaluation and management.      Elona Yinger B Kyndahl Jablon   Ulonda Klosowski, [employer], [place] 03/09/24 1259

## 2024-03-11 ENCOUNTER — Ambulatory Visit: Admitting: Physical Therapy

## 2024-03-15 ENCOUNTER — Encounter: Payer: Self-pay | Admitting: Physical Therapy

## 2024-03-15 ENCOUNTER — Ambulatory Visit: Admitting: Physical Therapy

## 2024-03-15 DIAGNOSIS — M25612 Stiffness of left shoulder, not elsewhere classified: Secondary | ICD-10-CM | POA: Diagnosis not present

## 2024-03-15 DIAGNOSIS — M25512 Pain in left shoulder: Secondary | ICD-10-CM | POA: Diagnosis not present

## 2024-03-15 DIAGNOSIS — R600 Localized edema: Secondary | ICD-10-CM

## 2024-03-15 NOTE — Therapy (Signed)
 OUTPATIENT PHYSICAL THERAPY SHOULDER EVALUATION   Patient Name: Arthur Moore MRN: [id number] DOB:23-Jan-1951, 73 y.o., male Today's Date: 03/15/2024  END OF SESSION:  PT End of Session - 03/15/24 1104     Visit Number 3    Date for Recertification  05/28/24    PT Start Time 1103    PT Stop Time 1150    PT Time Calculation (min) 47 min    Activity Tolerance Patient tolerated treatment well    Behavior During Therapy [facility] for tasks assessed/performed          Past Medical History:  Diagnosis Date   Aortic insufficiency    Arthritis    Asthma    CAD (coronary artery disease)    Carotid bruit 07/09/2021   carotid ultrasound near normal 2023   Cervical disc disease    Depression    Diastolic dysfunction 06/20/2016   Hyperlipidemia 08/24/2015   Hypertension    Lumbar disc disease    Past Surgical History:  Procedure Laterality Date   CHOLECYSTECTOMY  2012   HERNIA REPAIR     KNEE ARTHROSCOPY W/ MENISCAL REPAIR  11/2021   LEFT HEART CATH AND CORONARY ANGIOGRAPHY N/A 07/08/2018   Procedure: LEFT HEART CATH AND CORONARY ANGIOGRAPHY;  Surgeon: Verlin Lonni BIRCH, MD;  Location: [facility];  Service: Cardiovascular;  Laterality: N/A;   LEFT HEART CATH AND CORONARY ANGIOGRAPHY N/A 11/12/2023   Procedure: LEFT HEART CATH AND CORONARY ANGIOGRAPHY;  Surgeon: Ladona Heinz, MD;  Location: [facility];  Service: Cardiovascular;  Laterality: N/A;   PAROTIDECTOMY Right 1992   PROSTATE SURGERY  2002, 2004   SINOSCOPY     SINUS EXPLORATION  2013   SPINE SURGERY  2013   L C7-T1 laminectomy and discectomy   TONSILLECTOMY     Patient Active Problem List   Diagnosis Date Noted   Exertional dyspnea 11/12/2023   Unstable angina (HCC) 11/11/2023   Chest pain 11/10/2023   Snoring 02/14/2022   Mild sleep apnea 02/12/2022   Carotid bruit 07/09/2021   Aneurysm of ascending aorta 06/28/2020   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   CAD  (coronary artery disease) 07/09/2018   CKD (chronic kidney disease), 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   Chest pain of uncertain etiology 07/08/2018   Chronic cough 06/21/2018   Thrombocytopenia 12/01/2016   Transaminitis 12/01/2016   Diastolic dysfunction 06/20/2016   MDD (major depressive disorder), recurrent severe, 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   Hyperlipidemia 08/24/2015   Arthritis    Depression    Hypertension    Aortic insufficiency    Psoriasis    Cervical disc disease    Lumbar disc disease    Asthma 01/06/2015   Allergic rhinoconjunctivitis 01/06/2015   GERD (gastroesophageal reflux disease) 01/06/2015   Laryngopharyngeal reflux (LPR) 01/06/2015    PCP: dayna, DO  REFERRING PROVIDER: Supple, MD  REFERRING DIAG: S/P   THERAPY DIAG:  Acute pain of left shoulder  Stiffness of left shoulder, not elsewhere classified  Localized edema  Rationale for Evaluation and Treatment: Rehabilitation  ONSET DATE: 02/16/24  SUBJECTIVE:  SUBJECTIVE STATEMENT: Doing ok   Patient underwent a left shoulder DCR/SAD and RCR on 02/16/24.  Patient reports that the left shoulder has been hurting since 2019.   Hand dominance: Right  PERTINENT HISTORY: See above  PAIN:  Are you having pain? Yes: NPRS scale: 5/10 Pain location: left shoulder Pain description: ache, dull Aggravating factors: pain up to 8/10 at night and with use Relieving factors: pain meds, rest, ice at best 3/10  PRECAUTIONS: Note to provider: s/p gentle passive ROM 1x/wk for 4 weeks per protocol sx 10/21 lt sa,sad,dcr PT to start 7-10 days after DOS please schedule PT and post op Eval and treat s/p Rotator Cuff Repair 1x/wk for 4 weeks Exercises: per PROTOCOL- gental passive ROM Modalities: per PROTOCOL   RED FLAGS: None   WEIGHT  BEARING RESTRICTIONS: No  FALLS:  Has patient fallen in last 6 months? No  LIVING ENVIRONMENT: Lives with: lives with their family Lives in: House/apartment Stairs: No Has following equipment at home: None  OCCUPATION: retired  PLOF: Independent and does yard work, does have right elbow pain, he was playing pickleball 2x/week  PATIENT GOALS:have no pain, play pickle ball work out  NEXT MD VISIT: Next week Nov 3  OBJECTIVE:  Note: Objective measures were completed at Evaluation unless otherwise noted.  DIAGNOSTIC FINDINGS:  RC tear  COGNITION: Overall cognitive status: Within functional limits for tasks assessed     SENSATION: WFL  POSTURE: Fwd head, rounded shoulders  UPPER EXTREMITY ROM:   Passive ROM Right eval Left eval AROM Left 03/08/24  Shoulder flexion  80 wfl  Shoulder extension     Shoulder abduction  85 170  Shoulder adduction     Shoulder internal rotation  30   Shoulder external rotation  40   Elbow flexion  WFL   Elbow extension  WFL   Wrist flexion     Wrist extension     Wrist ulnar deviation     Wrist radial deviation     Wrist pronation     Wrist supination     (Blank rows = not tested)  UPPER EXTREMITY MMT: N/T due to surgery  MMT Right eval Left eval  Shoulder flexion    Shoulder extension    Shoulder abduction    Shoulder adduction    Shoulder internal rotation    Shoulder external rotation    Middle trapezius    Lower trapezius    Elbow flexion    Elbow extension    Wrist flexion    Wrist extension    Wrist ulnar deviation    Wrist radial deviation    Wrist pronation    Wrist supination    Grip strength (lbs)    (Blank rows = not tested)  PALPATION:  He is very tender in the trap, neck, shoulder and upper arm, has steristrips in place, significant ecchymosis  TREATMENT DATE:   03/15/24 NuStep L 5 x 4 min UBE L2 x 2 min each  Seated Rows & Lats 25lb 2x10 Chest press 25lb 2x10 Shoulder abd 3lb 2x10 Shoulder Flex 3lb 2x10  OHP 4lb 2x10 Shoulder ER green 2x10 Shoulder Ext 10lb 2x10 Vaso LUE x15 min  03/08/24 NuStep L 5 x 6 min AAROM Flex, Ext, IR WaTE 2lb 2x10 Shoulder abd 2lb 2x10 Shoulder Flex 2lb 2x10  LUE ER/IR red 2x10 Shoulder Ext 5lb 2x10 LUE triceps 15lb 2x10 LUE Bicept curls 15lb 2x10 Vaso LUE x10 min  02/26/24 Evaluation Education on sling, protocol precautions, he is doing too much now if he had an RCR   PATIENT EDUCATION: Education details: POC/HEP Person educated: Patient Education method: Programmer, Multimedia, Facilities Manager, Verbal cues, and Handouts Education comprehension: verbalized understanding  HOME EXERCISE PROGRAM: Access Code: [email or web address] URL: [email or web address] Date: 02/26/2024 Prepared by: Ozell Mainland  Exercises - Seated Shoulder Flexion Towel Slide at Table Top Full Range of Motion  - 2 x daily - 7 x weekly - 1 sets - 10 reps - 10 hold - Seated Shoulder External Rotation PROM on Table  - 2 x daily - 7 x weekly - 1 sets - 10 reps - 10 hold - Seated Shoulder Shrugs  - 2 x daily - 7 x weekly - 3 sets - 10 reps - 3 hold - Seated Scapular Retraction  - 2 x daily - 7 x weekly - 3 sets - 10 reps - 3 hold  ASSESSMENT:  CLINICAL IMPRESSION: Patient is a 73 y.o. male who was seen today for physical therapy  treatment for left shoulder DCR, SAD.  Progressed with more scapular strengthening and stability. LUE  weakness with shoulder flexion and abduction. Tactiel cues needed to LUE with ER to keep UE to his side. All interventions completed well.   OBJECTIVE IMPAIRMENTS: cardiopulmonary status limiting activity, decreased activity tolerance, decreased coordination, decreased endurance, decreased ROM, decreased strength, increased edema, increased fascial restrictions, increased muscle spasms, impaired flexibility,  impaired UE functional use, improper body mechanics, postural dysfunction, and pain.   REHAB POTENTIAL: Good  CLINICAL DECISION MAKING: Evolving/moderate complexity  EVALUATION COMPLEXITY: Low   GOALS: Goals reviewed with patient? Yes  SHORT TERM GOALS: Target date: 03/21/24  Independent with initial HEP Baseline: Goal status: ongoing 03/08/24  LONG TERM GOALS: Target date: 05/28/24  Independent with advanced HEP Baseline:  Goal status: INITIAL  2.  Decrease pain 50% Baseline:  Goal status: INITIAL  3.  Increase AROM of the left shoulder to WFL's for all motions Baseline:  Goal status: INITIAL  4.  Lift 3# overhead Baseline:  Goal status: INITIAL  5.  Decrease pain 50% Baseline:  Goal status: INITIAL   PLAN:  PT FREQUENCY: 1-2x/week  PT DURATION: 12 weeks  PLANNED INTERVENTIONS: 97164- PT Re-evaluation, 97110-Therapeutic exercises, 97530- Therapeutic activity, 97112- Neuromuscular re-education, 97535- Self Care, 02859- Manual therapy, G0283- Electrical stimulation (unattended), 97016- Vasopneumatic device, Patient/Family education, Taping, Joint mobilization, and Cryotherapy  PLAN FOR NEXT SESSION: See what the MD said   Tanda KANDICE Sorrow, PTA 03/15/2024, 11:37 AM

## 2024-03-16 DIAGNOSIS — R251 Tremor, unspecified: Secondary | ICD-10-CM | POA: Diagnosis not present

## 2024-03-16 DIAGNOSIS — R930 Abnormal findings on diagnostic imaging of skull and head, not elsewhere classified: Secondary | ICD-10-CM | POA: Diagnosis not present

## 2024-03-18 DIAGNOSIS — M79641 Pain in right hand: Secondary | ICD-10-CM | POA: Diagnosis not present

## 2024-03-18 DIAGNOSIS — R52 Pain, unspecified: Secondary | ICD-10-CM | POA: Diagnosis not present

## 2024-03-18 DIAGNOSIS — M79642 Pain in left hand: Secondary | ICD-10-CM | POA: Diagnosis not present

## 2024-03-21 ENCOUNTER — Telehealth: Payer: Self-pay

## 2024-03-21 NOTE — Telephone Encounter (Signed)
 Called to discuss next PREP class at [name], left voicemail requesting return call.

## 2024-03-22 ENCOUNTER — Ambulatory Visit: Admitting: Physical Therapy

## 2024-03-22 ENCOUNTER — Encounter: Payer: Self-pay | Admitting: Physical Therapy

## 2024-03-22 DIAGNOSIS — M25512 Pain in left shoulder: Secondary | ICD-10-CM

## 2024-03-22 DIAGNOSIS — M25612 Stiffness of left shoulder, not elsewhere classified: Secondary | ICD-10-CM | POA: Diagnosis not present

## 2024-03-22 DIAGNOSIS — R600 Localized edema: Secondary | ICD-10-CM | POA: Diagnosis not present

## 2024-03-22 NOTE — Therapy (Signed)
 OUTPATIENT PHYSICAL THERAPY SHOULDER EVALUATION   Patient Name: Arthur Moore MRN: [id number] DOB:1950/06/20, 73 y.o., male Today's Date: 03/22/2024  END OF SESSION:  PT End of Session - 03/22/24 1057     Visit Number 4    Date for Recertification  05/28/24    PT Start Time 1100    PT Stop Time 1145    PT Time Calculation (min) 45 min    Activity Tolerance Patient tolerated treatment well    Behavior During Therapy [facility] for tasks assessed/performed          Past Medical History:  Diagnosis Date   Aortic insufficiency    Arthritis    Asthma    CAD (coronary artery disease)    Carotid bruit 07/09/2021   carotid ultrasound near normal 2023   Cervical disc disease    Depression    Diastolic dysfunction 06/20/2016   Hyperlipidemia 08/24/2015   Hypertension    Lumbar disc disease    Past Surgical History:  Procedure Laterality Date   CHOLECYSTECTOMY  2012   HERNIA REPAIR     KNEE ARTHROSCOPY W/ MENISCAL REPAIR  11/2021   LEFT HEART CATH AND CORONARY ANGIOGRAPHY N/A 07/08/2018   Procedure: LEFT HEART CATH AND CORONARY ANGIOGRAPHY;  Surgeon: Verlin Lonni BIRCH, MD;  Location: [facility];  Service: Cardiovascular;  Laterality: N/A;   LEFT HEART CATH AND CORONARY ANGIOGRAPHY N/A 11/12/2023   Procedure: LEFT HEART CATH AND CORONARY ANGIOGRAPHY;  Surgeon: Ladona Heinz, MD;  Location: [facility];  Service: Cardiovascular;  Laterality: N/A;   PAROTIDECTOMY Right 1992   PROSTATE SURGERY  2002, 2004   SINOSCOPY     SINUS EXPLORATION  2013   SPINE SURGERY  2013   L C7-T1 laminectomy and discectomy   TONSILLECTOMY     Patient Active Problem List   Diagnosis Date Noted   Exertional dyspnea 11/12/2023   Unstable angina (HCC) 11/11/2023   Chest pain 11/10/2023   Snoring 02/14/2022   Mild sleep apnea 02/12/2022   Carotid bruit 07/09/2021   Aneurysm of ascending aorta 06/28/2020   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   CAD  (coronary artery disease) 07/09/2018   CKD (chronic kidney disease), 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   Chest pain of uncertain etiology 07/08/2018   Chronic cough 06/21/2018   Thrombocytopenia 12/01/2016   Transaminitis 12/01/2016   Diastolic dysfunction 06/20/2016   MDD (major depressive disorder), recurrent severe, 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   Hyperlipidemia 08/24/2015   Arthritis    Depression    Hypertension    Aortic insufficiency    Psoriasis    Cervical disc disease    Lumbar disc disease    Asthma 01/06/2015   Allergic rhinoconjunctivitis 01/06/2015   GERD (gastroesophageal reflux disease) 01/06/2015   Laryngopharyngeal reflux (LPR) 01/06/2015    PCP: dayna, DO  REFERRING PROVIDER: Supple, MD  REFERRING DIAG: S/P   THERAPY DIAG:  Acute pain of left shoulder  Stiffness of left shoulder, not elsewhere classified  Localized edema  Rationale for Evaluation and Treatment: Rehabilitation  ONSET DATE: 02/16/24  SUBJECTIVE:  SUBJECTIVE STATEMENT: Think I need only a couple more times   Patient underwent a left shoulder DCR/SAD and RCR on 02/16/24.  Patient reports that the left shoulder has been hurting since 2019.   Hand dominance: Right  PERTINENT HISTORY: See above  PAIN:  Are you having pain? Yes: NPRS scale: 3/10 Pain location: left shoulder Pain description: ache, dull Aggravating factors: pain up to 8/10 at night and with use Relieving factors: pain meds, rest, ice at best 3/10  PRECAUTIONS: Note to provider: s/p gentle passive ROM 1x/wk for 4 weeks per protocol sx 10/21 lt sa,sad,dcr PT to start 7-10 days after DOS please schedule PT and post op Eval and treat s/p Rotator Cuff Repair 1x/wk for 4 weeks Exercises: per PROTOCOL- gental passive ROM Modalities: per PROTOCOL    RED FLAGS: None   WEIGHT BEARING RESTRICTIONS: No  FALLS:  Has patient fallen in last 6 months? No  LIVING ENVIRONMENT: Lives with: lives with their family Lives in: House/apartment Stairs: No Has following equipment at home: None  OCCUPATION: retired  PLOF: Independent and does yard work, does have right elbow pain, he was playing pickleball 2x/week  PATIENT GOALS:have no pain, play pickle ball work out  NEXT MD VISIT: Next week Nov 3  OBJECTIVE:  Note: Objective measures were completed at Evaluation unless otherwise noted.  DIAGNOSTIC FINDINGS:  RC tear  COGNITION: Overall cognitive status: Within functional limits for tasks assessed     SENSATION: WFL  POSTURE: Fwd head, rounded shoulders  UPPER EXTREMITY ROM:   Passive ROM Right eval Left eval AROM Left 03/08/24 AROM 03/22/24 Left  Shoulder flexion  80 wfl WFL  Shoulder extension      Shoulder abduction  85 170 WFL  Shoulder adduction      Shoulder internal rotation  30  WFL  Shoulder external rotation  40  WFL  Elbow flexion  WFL    Elbow extension  WFL    Wrist flexion      Wrist extension      Wrist ulnar deviation      Wrist radial deviation      Wrist pronation      Wrist supination      (Blank rows = not tested)  UPPER EXTREMITY MMT: N/T due to surgery  MMT Right eval Left eval  Shoulder flexion    Shoulder extension    Shoulder abduction    Shoulder adduction    Shoulder internal rotation    Shoulder external rotation    Middle trapezius    Lower trapezius    Elbow flexion    Elbow extension    Wrist flexion    Wrist extension    Wrist ulnar deviation    Wrist radial deviation    Wrist pronation    Wrist supination    Grip strength (lbs)    (Blank rows = not tested)  PALPATION:  He is very tender in the trap, neck, shoulder and upper arm, has steristrips in place, significant ecchymosis  TREATMENT DATE:  03/22/24 UBE L3 x 3 min each Seated Rows & Lats 35lb 2x10 Chest press 35lb 2x10 LUE ER/IR 5lb 2x10 Shoulder Ext 10lb 2x10 OHP 4lb 2x10 Shoulder abd 4lb 2x10 Vase, med pressure, 34 degrees x15 min  03/15/24 NuStep L 5 x 4 min UBE L2 x 2 min each  Seated Rows & Lats 25lb 2x10 Chest press 25lb 2x10 Shoulder abd 3lb 2x10 Shoulder Flex 3lb 2x10  OHP 4lb 2x10 Shoulder ER green 2x10 Shoulder Ext 10lb 2x10 Vaso LUE x15 min  03/08/24 NuStep L 5 x 6 min AAROM Flex, Ext, IR WaTE 2lb 2x10 Shoulder abd 2lb 2x10 Shoulder Flex 2lb 2x10  LUE ER/IR red 2x10 Shoulder Ext 5lb 2x10 LUE triceps 15lb 2x10 LUE Bicept curls 15lb 2x10 Vaso LUE x10 min  02/26/24 Evaluation Education on sling, protocol precautions, he is doing too much now if he had an RCR   PATIENT EDUCATION: Education details: POC/HEP Person educated: Patient Education method: Programmer, Multimedia, Facilities Manager, Verbal cues, and Handouts Education comprehension: verbalized understanding  HOME EXERCISE PROGRAM: Access Code: [email or web address] URL: [email or web address] Date: 02/26/2024 Prepared by: Ozell Mainland  Exercises - Seated Shoulder Flexion Towel Slide at Table Top Full Range of Motion  - 2 x daily - 7 x weekly - 1 sets - 10 reps - 10 hold - Seated Shoulder External Rotation PROM on Table  - 2 x daily - 7 x weekly - 1 sets - 10 reps - 10 hold - Seated Shoulder Shrugs  - 2 x daily - 7 x weekly - 3 sets - 10 reps - 3 hold - Seated Scapular Retraction  - 2 x daily - 7 x weekly - 3 sets - 10 reps - 3 hold  ASSESSMENT:  CLINICAL IMPRESSION: Patient is a 73 y.o. male who was seen today for physical therapy  treatment for left shoulder DCR, SAD.  Pt has progressed increasing his LUE AROM in all directions and has reported decrease shoulder pain overall. Tactiel cues needed to LUE with ER to keep UE to his side. All interventions completed well. Will  continue to progress towards D/C   OBJECTIVE IMPAIRMENTS: cardiopulmonary status limiting activity, decreased activity tolerance, decreased coordination, decreased endurance, decreased ROM, decreased strength, increased edema, increased fascial restrictions, increased muscle spasms, impaired flexibility, impaired UE functional use, improper body mechanics, postural dysfunction, and pain.   REHAB POTENTIAL: Good  CLINICAL DECISION MAKING: Evolving/moderate complexity  EVALUATION COMPLEXITY: Low   GOALS: Goals reviewed with patient? Yes  SHORT TERM GOALS: Target date: 03/21/24  Independent with initial HEP Baseline: Goal status: ongoing 03/08/24, Met 1125/25  LONG TERM GOALS: Target date: 05/28/24  Independent with advanced HEP Baseline:  Goal status: INITIAL  2.  Decrease pain 50% Baseline:  Goal status: Met 60-70% 03/22/24  3.  Increase AROM of the left shoulder to WFL's for all motions Baseline:  Goal status: Met 03/22/24  4.  Lift 3# overhead Baseline:  Goal status: Met 03/22/24  5.  Decrease pain 50% Baseline:  Goal status: INITIAL   PLAN:  PT FREQUENCY: 1-2x/week  PT DURATION: 12 weeks  PLANNED INTERVENTIONS: 97164- PT Re-evaluation, 97110-Therapeutic exercises, 97530- Therapeutic activity, 97112- Neuromuscular re-education, 97535- Self Care, 02859- Manual therapy, G0283- Electrical stimulation (unattended), 97016- Vasopneumatic device, Patient/Family education, Taping, Joint mobilization, and Cryotherapy  PLAN FOR NEXT SESSION: See what the MD said   Tanda KANDICE Sorrow, PTA 03/22/2024, 10:58 AM

## 2024-03-27 DIAGNOSIS — I25118 Atherosclerotic heart disease of native coronary artery with other forms of angina pectoris: Secondary | ICD-10-CM | POA: Diagnosis not present

## 2024-03-27 DIAGNOSIS — E7800 Pure hypercholesterolemia, unspecified: Secondary | ICD-10-CM | POA: Diagnosis not present

## 2024-03-27 DIAGNOSIS — F411 Generalized anxiety disorder: Secondary | ICD-10-CM | POA: Diagnosis not present

## 2024-03-27 DIAGNOSIS — I10 Essential (primary) hypertension: Secondary | ICD-10-CM | POA: Diagnosis not present

## 2024-03-29 ENCOUNTER — Ambulatory Visit: Admitting: Physical Therapy

## 2024-03-31 ENCOUNTER — Ambulatory Visit: Admitting: Physical Therapy

## 2024-03-31 DIAGNOSIS — R251 Tremor, unspecified: Secondary | ICD-10-CM | POA: Diagnosis not present

## 2024-04-04 ENCOUNTER — Ambulatory Visit (INDEPENDENT_AMBULATORY_CARE_PROVIDER_SITE_OTHER)

## 2024-04-04 DIAGNOSIS — J302 Other seasonal allergic rhinitis: Secondary | ICD-10-CM | POA: Diagnosis not present

## 2024-04-04 DIAGNOSIS — J309 Allergic rhinitis, unspecified: Secondary | ICD-10-CM

## 2024-04-05 ENCOUNTER — Ambulatory Visit: Admitting: Physical Therapy

## 2024-04-07 ENCOUNTER — Ambulatory Visit: Admitting: Physical Therapy

## 2024-04-07 DIAGNOSIS — M79641 Pain in right hand: Secondary | ICD-10-CM | POA: Diagnosis not present

## 2024-04-07 DIAGNOSIS — M79642 Pain in left hand: Secondary | ICD-10-CM | POA: Diagnosis not present

## 2024-04-11 ENCOUNTER — Ambulatory Visit (INDEPENDENT_AMBULATORY_CARE_PROVIDER_SITE_OTHER): Admitting: Cardiovascular Disease

## 2024-04-11 ENCOUNTER — Encounter (HOSPITAL_BASED_OUTPATIENT_CLINIC_OR_DEPARTMENT_OTHER): Payer: Self-pay | Admitting: Cardiovascular Disease

## 2024-04-11 ENCOUNTER — Telehealth (HOSPITAL_BASED_OUTPATIENT_CLINIC_OR_DEPARTMENT_OTHER): Payer: Self-pay

## 2024-04-11 VITALS — BP 138/72 | HR 70 | Ht 72.0 in | Wt 197.0 lb

## 2024-04-11 DIAGNOSIS — I10 Essential (primary) hypertension: Secondary | ICD-10-CM | POA: Diagnosis not present

## 2024-04-11 DIAGNOSIS — E785 Hyperlipidemia, unspecified: Secondary | ICD-10-CM | POA: Diagnosis not present

## 2024-04-11 DIAGNOSIS — Z5181 Encounter for therapeutic drug level monitoring: Secondary | ICD-10-CM | POA: Diagnosis not present

## 2024-04-11 NOTE — Telephone Encounter (Signed)
° °  Patient Name: Arthur Moore  DOB: 06/30/1950 MRN: [id number]  Primary Cardiologist: Annabella Scarce, MD  Chart reviewed as part of pre-operative protocol coverage. Given past medical history and time since last visit, based on ACC/AHA guidelines, Arthur Moore is at acceptable risk for the planned procedure without further cardiovascular testing.   Per Dr. Scarce: 04/11/2024 Well-managed, stable with no significant changes on cath this year. He is low risk for upcoming surgeries and can hold aspirin  as needed.   The patient was advised that if he develops new symptoms prior to surgery to contact our office to arrange for a follow-up visit, and he verbalized understanding.  I will route this recommendation to the requesting party via Epic fax function and remove from pre-op pool.  Please call with questions.  Lamarr Satterfield, NP 04/11/2024, 2:48 PM

## 2024-04-11 NOTE — Telephone Encounter (Signed)
° °  Pre-operative Risk Assessment    Patient Name: Arthur Moore  DOB: [birth year] MRN: [id number]   Date of last office visit: 04/11/2024 with Dr. Raford Date of next office visit: None  Request for Surgical Clearance    Procedure:  Bilateral upper eyelid blepharoplasty  Date of Surgery:  Clearance 05/09/24                                 Surgeon:  Dr. Renzo Zaldivar Surgeon's Group or Practice Name:  [facility]  Phone number:  [phone] Fax number:  [phone]   Type of Clearance Requested:   - Medical  - Pharmacy:  Hold Aspirin  -Does not specify   Type of Anesthesia:  Does not specify   Additional requests/questions:  Non  SignedPatrcia Iverson CROME   04/11/2024, 2:34 PM

## 2024-04-11 NOTE — Progress Notes (Signed)
 Cardiology Office Note:  .   Date:  04/11/2024  ID:  Arthur Moore, DOB Jun 06, 1950, MRN [id number] PCP: Dayna Motto, DO  [facility] Providers Cardiologist:  Annabella Scarce, MD    History of Present Illness: .   Arthur Moore is a 73 y.o. male with non-obstructive CAD, mild-moderate aortic insufficiency, hypertension, hyperlipidemia, asthma, TIA, OSA not on CPAP, liver fibrosis, anxiety, and depression who presents for follow up.  Arthur Moore wore a Holter monitor 06/2013 that revealed rare PACs and PVCs.  He previously followed up with a cardiologist in [place]  due to mild AR.  He subsequently had an echo at the [facility] in 2015 that reported it as mild-moderate.  He had a repeat echocardiogram 12/05/15 revealed LVEF 55-60% with grade 2 diastolic. Aortic regurgitation remained mild to moderate.  Arthur Moore reported fatigue and atypical chest pain.  He was referred for an exercise Myoview  3/18 that revealed LVEF 49% and asynchronous contraction. There was a small defect in the basal inferior region that was thought to be diaphragmatic attenuation.  He achieved 7 METS on a Bruce protocol.  He called our office after these results due to persistent chest pain and it was suggested that he follow up with GI.  He was subsequently diagnosed with diverticulitis.   Arthur Moore saw Shona Shad, PA-C, for chest pain.  He had previously been seen in the ED with chest pain.  Cardiac enzymes and CT of the chest, abdomen, and pelvis were unremarkable.  His chest pain was atypical and occurred when he was trying to lie down in bed at night.  He was referred for coronary CT-A 04/2017 that revealed a coronary calcium  score 361 which is 78th percentile.  He had extensive calcified plaque in the proximal LAD with possible moderate stenosis.  FFR was negative.  Repeat echo 08/2017 revealed LVEF 55 to 65% with grade 1 diastolic dysfunction.  He was seen in the ED 06/2018 with chest pain.  Cardiac enzymes  were negative.  He underwent cardiac catheterization 06/2018 which revealed 50% stenosis in D1 and otherwise 10 to 20% stenoses in the distal RCA, proximal left circumflex, distal left main, and proximal LAD.  Arthur Moore was started on Vascepa  for hypertriglyceridemia and CAD.  It worked well for his triglycerides.  However he was unable to afford it.  He tried to get it filled through the [facility] and was denied.  Metoprolol  succinate was also denied as were PCSK9 inhibitors.  Arthur Moore was seen by Josefa Beauvais on 04/2019 after a visit to the ED for chest pain.  His ischemia evaluation was unremarkable.  His chest pain was worse with inhalation.  He had no exertional chest pain.  No further work-up was performed.    He had blood work at the [facility] and the numbers were excellent.  His lipids were all at goal. He reported some LE heaviness that he attributed to deconditioning. He was seen in the ED 03/2021 after a head injury while playing pickleball. In 10/2021 he followed up with Reche Finder, NP for preoperative clearance. He underwent right medial meniscus repair performed by Dr. Sharl in [facility] 11/2021.  He called the office 05/2022 complaining of shortness of breath for 1 month.  He had a coronary CTA 06/2022 that revealed moderate stenosis of the LAD and mild disease in the RCA and left circumflex.  FFR testing was consistent with nonobstructive disease.  He followed up with Reche Finder, NP 06/2022 and noted that his breathing was  better.  Symptoms improved with use of inhalers and nebulizer.  He was referred to the PREP program at the [facility].   At his appointment 12/2022 he was struggling with anxiety but doing well from a cardiac standpoint.  Repeat echo  06/2023 revealed moderate AR and normal systolic function.  He was admitted 10/2024 with shoulder pain.  Cardiac enzymes were negative.  Given his moderate CAD he underwent repeat LHC and had 50% ostial D1, 20% LAD, and 20% RCA disease.  He followed up  with Reche Finder, NP and was stable.  Echo 12/2023 revealed LVEF 55-60% with mild-moderate AR.   Discussed the use of AI scribe software for clinical note transcription with the patient, who gave verbal consent to proceed.  History of Present Illness Arthur Moore notes a recent weight gain, attributed to lack of exercise due to multiple surgeries in the past seven months, including cataract surgeries, shoulder surgery, and upcoming tendon repair and blepharoplasty.  He has been experiencing tremors and was diagnosed with Parkinson's disease in 2013. Initially treated with levodopa, which he felt was ineffective, recent evaluations included a brain scan and a DAT scan, confirming Parkinson's disease. A biopsy was performed to confirm the diagnosis, and he is awaiting results. He is concerned about the possibility of Parkinsonism.  He reports high triglycerides and elevated A1c levels, which he attributes to increased sugar intake due to inactivity. He acknowledges stress eating. His liver doctor monitors his AST and ALT levels, which were slightly elevated. He has a history of cirrhosis, managed with a scoring system called Child and MELD, currently at a score of eight.  He is on several cardiac medications, including baby aspirin , metoprolol  12.5 mg twice a day, and rosuvastatin . His blood pressure readings at home are generally good, with occasional higher readings. He requests a new prescription for nitroglycerin  as his current one has expired.  He has a history of exposure to Agent Orange in [place], which he notes can be a condition for disability. He has undergone a DNA study to identify medications incompatible with his DNA, influencing his current medication regimen.  ROS:  As per HPI  Studies Reviewed: .       Echo 12/31/2023: 1. Left ventricular ejection fraction, by estimation, is 55 to 60%. The  left ventricle has normal function. The left ventricle has no regional  wall motion  abnormalities. Left ventricular diastolic parameters were  normal. The average left ventricular  global longitudinal strain is -16.8 %. The global longitudinal strain is  normal.   2. Right ventricular systolic function is normal. The right ventricular  size is normal.   3. The mitral valve is normal in structure. Trivial mitral valve  regurgitation. No evidence of mitral stenosis.   4. The aortic valve is tricuspid. Aortic valve regurgitation is mild to  moderate. No aortic stenosis is present. Aortic regurgitation PHT measures  681 msec.   5. The inferior vena cava is normal in size with greater than 50%  respiratory variability, suggesting right atrial pressure of 3 mmHg.   Risk Assessment/Calculations:             Physical Exam:   VS:  BP 138/72   Pulse 70   Ht 6' (1.829 m)   Wt 197 lb (89.4 kg)   SpO2 95%   BMI 26.72 kg/m  , BMI Body mass index is 26.72 kg/m. GENERAL:  Well appearing HEENT: Pupils equal round and reactive, fundi not visualized, oral mucosa unremarkable NECK:  No  jugular venous distention, waveform within normal limits, carotid upstroke brisk and symmetric, no bruits, no thyromegaly LUNGS:  Clear to auscultation bilaterally HEART:  RRR.  PMI not displaced or sustained,S1 and S2 within normal limits, no S3, no S4, no clicks, no rubs, no murmurs ABD:  Flat, positive bowel sounds normal in frequency in pitch, no bruits, no rebound, no guarding, no midline pulsatile mass, no hepatomegaly, no splenomegaly EXT:  2 plus pulses throughout, no edema, no cyanosis no clubbing SKIN:  No rashes no nodules NEURO:  Cranial nerves II through XII grossly intact, motor grossly intact throughout PSYCH:  Cognitively intact, oriented to person place and time   ASSESSMENT AND PLAN: .    Assessment & Plan Coronary artery disease Well-managed, stable with no significant changes on cath this year. He is low risk for upcoming surgeries and can hold aspirin  as needed. - Continue  baby aspirin , metoprolol , rosuvastatin . - Prescribed nitroglycerin  refill. - Encouraged regular exercise and healthy diet.  # Hyperlipidemia LDL controlled under 70 mg/dL. Triglycerides increased due to stress eating and inactivity. A1c in prediabetic range. - Encouraged dietary modifications to reduce carbohydrate intake. - Encouraged increased physical activity. - Recheck cholesterol and A1c in four months. - Continue rosuvastatin   # Primary hypertension Blood pressure generally well-controlled with occasional elevations. Metoprolol  for cardiovascular protection. - Continue current antihypertensive regimen. - Encouraged regular exercise. - Monitor blood pressure at home, especially in the morning, and bring readings to next appointment.  # Mild-moderate AR: Stable.     Dispo: f/u in 6 months  Signed, Annabella Scarce, MD

## 2024-04-11 NOTE — Patient Instructions (Signed)
 Medication Instructions:  Your physician recommends that you continue on your current medications as directed. Please refer to the Current Medication list given to you today.  *If you need a refill on your cardiac medications before your next appointment, please call your pharmacy*  Lab Work: FASTING LIPID/CMET IN 4 MONTHS ABOUT A WEEK PRIOR TO FOLLOW UP   If you have labs (blood work) drawn today and your tests are completely normal, you will receive your results only by: MyChart Message (if you have MyChart) OR A paper copy in the mail If you have any lab test that is abnormal or we need to change your treatment, we will call you to review the results.  Testing/Procedures: NONE  Follow-Up: At [facility], you and your health needs are our priority.  As part of our continuing mission to provide you with exceptional heart care, our providers are all part of one team.  This team includes your primary Cardiologist (physician) and Advanced Practice Providers or APPs (Physician Assistants and Nurse Practitioners) who all work together to provide you with the care you need, when you need it.  Your next appointment:   4 month(s)  Provider:   Annabella Scarce, MD, Rosaline Bane, NP, or Reche Finder, NP    We recommend signing up for the patient portal called MyChart.  Sign up information is provided on this After Visit Summary.  MyChart is used to connect with patients for Virtual Visits (Telemedicine).  Patients are able to view lab/test results, encounter notes, upcoming appointments, etc.  Non-urgent messages can be sent to your provider as well.   To learn more about what you can do with MyChart, go to [email or web address].   Other Instructions INCREASE YOUR EXERCISE

## 2024-04-12 ENCOUNTER — Ambulatory Visit: Admitting: Physical Therapy

## 2024-04-14 ENCOUNTER — Ambulatory Visit: Admitting: Physical Therapy

## 2024-05-03 DIAGNOSIS — J302 Other seasonal allergic rhinitis: Secondary | ICD-10-CM | POA: Diagnosis not present

## 2024-05-03 DIAGNOSIS — J309 Allergic rhinitis, unspecified: Secondary | ICD-10-CM

## 2024-05-03 NOTE — Progress Notes (Signed)
 VIALS MADE ON 05/03/24

## 2024-05-04 DIAGNOSIS — J3089 Other allergic rhinitis: Secondary | ICD-10-CM | POA: Diagnosis not present

## 2024-05-05 DIAGNOSIS — J302 Other seasonal allergic rhinitis: Secondary | ICD-10-CM | POA: Diagnosis not present

## 2024-05-06 DIAGNOSIS — J3081 Allergic rhinitis due to animal (cat) (dog) hair and dander: Secondary | ICD-10-CM | POA: Diagnosis not present

## 2024-05-14 ENCOUNTER — Encounter (HOSPITAL_BASED_OUTPATIENT_CLINIC_OR_DEPARTMENT_OTHER): Payer: Self-pay | Admitting: Cardiovascular Disease

## 2024-05-24 ENCOUNTER — Other Ambulatory Visit: Payer: Self-pay | Admitting: Nurse Practitioner

## 2024-05-24 DIAGNOSIS — K766 Portal hypertension: Secondary | ICD-10-CM

## 2024-05-24 DIAGNOSIS — K7469 Other cirrhosis of liver: Secondary | ICD-10-CM

## 2024-05-30 ENCOUNTER — Other Ambulatory Visit

## 2024-05-31 ENCOUNTER — Ambulatory Visit

## 2024-05-31 DIAGNOSIS — J302 Other seasonal allergic rhinitis: Secondary | ICD-10-CM

## 2024-06-02 ENCOUNTER — Inpatient Hospital Stay: Admission: RE | Admit: 2024-06-02 | Discharge: 2024-06-02 | Attending: Nurse Practitioner

## 2024-06-02 DIAGNOSIS — K7469 Other cirrhosis of liver: Secondary | ICD-10-CM

## 2024-06-02 DIAGNOSIS — K766 Portal hypertension: Secondary | ICD-10-CM

## 2024-07-05 ENCOUNTER — Ambulatory Visit: Admitting: Family

## 8386-12-28 DEATH — deceased
